# Patient Record
Sex: Female | Born: 1935
Health system: Southern US, Community
[De-identification: ages and names within clinical notes are randomized; demographics above are authoritative.]

## PROBLEM LIST (undated history)

## (undated) DIAGNOSIS — A770 Spotted fever due to Rickettsia rickettsii: Secondary | ICD-10-CM

## (undated) DIAGNOSIS — D329 Benign neoplasm of meninges, unspecified: Secondary | ICD-10-CM

## (undated) DIAGNOSIS — M545 Low back pain, unspecified: Secondary | ICD-10-CM

## (undated) DIAGNOSIS — K222 Esophageal obstruction: Secondary | ICD-10-CM

## (undated) DIAGNOSIS — F419 Anxiety disorder, unspecified: Secondary | ICD-10-CM

## (undated) DIAGNOSIS — E756 Lipid storage disorder, unspecified: Secondary | ICD-10-CM

## (undated) DIAGNOSIS — K219 Gastro-esophageal reflux disease without esophagitis: Secondary | ICD-10-CM

## (undated) DIAGNOSIS — G459 Transient cerebral ischemic attack, unspecified: Secondary | ICD-10-CM

## (undated) DIAGNOSIS — Z853 Personal history of malignant neoplasm of breast: Secondary | ICD-10-CM

## (undated) DIAGNOSIS — M1990 Unspecified osteoarthritis, unspecified site: Secondary | ICD-10-CM

## (undated) DIAGNOSIS — N281 Cyst of kidney, acquired: Secondary | ICD-10-CM

## (undated) DIAGNOSIS — I639 Cerebral infarction, unspecified: Secondary | ICD-10-CM

## (undated) DIAGNOSIS — D649 Anemia, unspecified: Secondary | ICD-10-CM

## (undated) DIAGNOSIS — I499 Cardiac arrhythmia, unspecified: Secondary | ICD-10-CM

## (undated) DIAGNOSIS — K648 Other hemorrhoids: Secondary | ICD-10-CM

## (undated) DIAGNOSIS — K224 Dyskinesia of esophagus: Secondary | ICD-10-CM

## (undated) DIAGNOSIS — K649 Unspecified hemorrhoids: Secondary | ICD-10-CM

## (undated) DIAGNOSIS — E785 Hyperlipidemia, unspecified: Secondary | ICD-10-CM

## (undated) DIAGNOSIS — K5790 Diverticulosis of intestine, part unspecified, without perforation or abscess without bleeding: Secondary | ICD-10-CM

## (undated) DIAGNOSIS — Z8719 Personal history of other diseases of the digestive system: Secondary | ICD-10-CM

## (undated) DIAGNOSIS — T7840XA Allergy, unspecified, initial encounter: Secondary | ICD-10-CM

## (undated) DIAGNOSIS — I2510 Atherosclerotic heart disease of native coronary artery without angina pectoris: Secondary | ICD-10-CM

## (undated) DIAGNOSIS — K635 Polyp of colon: Secondary | ICD-10-CM

## (undated) DIAGNOSIS — I10 Essential (primary) hypertension: Secondary | ICD-10-CM

## (undated) DIAGNOSIS — G709 Myoneural disorder, unspecified: Secondary | ICD-10-CM

## (undated) DIAGNOSIS — K5730 Diverticulosis of large intestine without perforation or abscess without bleeding: Secondary | ICD-10-CM

## (undated) DIAGNOSIS — E8809 Other disorders of plasma-protein metabolism, not elsewhere classified: Secondary | ICD-10-CM

## (undated) DIAGNOSIS — I4891 Unspecified atrial fibrillation: Secondary | ICD-10-CM

## (undated) DIAGNOSIS — M1712 Unilateral primary osteoarthritis, left knee: Secondary | ICD-10-CM

## (undated) DIAGNOSIS — G250 Essential tremor: Secondary | ICD-10-CM

## (undated) DIAGNOSIS — K449 Diaphragmatic hernia without obstruction or gangrene: Secondary | ICD-10-CM

## (undated) DIAGNOSIS — C50919 Malignant neoplasm of unspecified site of unspecified female breast: Secondary | ICD-10-CM

## (undated) DIAGNOSIS — M419 Scoliosis, unspecified: Secondary | ICD-10-CM

## (undated) DIAGNOSIS — I071 Rheumatic tricuspid insufficiency: Secondary | ICD-10-CM

## (undated) DIAGNOSIS — E538 Deficiency of other specified B group vitamins: Secondary | ICD-10-CM

## (undated) HISTORY — DX: Other hemorrhoids: K64.8

## (undated) HISTORY — DX: Anemia, unspecified: D64.9

## (undated) HISTORY — DX: Malignant neoplasm of unspecified site of unspecified female breast: C50.919

## (undated) HISTORY — DX: Other disorders of plasma-protein metabolism, not elsewhere classified: E88.09

## (undated) HISTORY — PX: AUGMENTATION MAMMAPLASTY: SUR837

## (undated) HISTORY — DX: Essential tremor: G25.0

## (undated) HISTORY — DX: Scoliosis, unspecified: M41.9

## (undated) HISTORY — PX: OTHER SURGICAL HISTORY: SHX169

## (undated) HISTORY — PX: UPPER GASTROINTESTINAL ENDOSCOPY: SHX188

## (undated) HISTORY — DX: Hyperlipidemia, unspecified: E78.5

## (undated) HISTORY — DX: Low back pain, unspecified: M54.50

## (undated) HISTORY — PX: OOPHORECTOMY: SHX86

## (undated) HISTORY — DX: Diverticulosis of large intestine without perforation or abscess without bleeding: K57.30

## (undated) HISTORY — DX: Personal history of malignant neoplasm of breast: Z85.3

## (undated) HISTORY — DX: Gastro-esophageal reflux disease without esophagitis: K21.9

## (undated) HISTORY — PX: VAGINAL HYSTERECTOMY: SUR661

## (undated) HISTORY — DX: Dyskinesia of esophagus: K22.4

## (undated) HISTORY — PX: CATARACT EXTRACTION, BILATERAL: SHX1313

## (undated) HISTORY — DX: Polyp of colon: K63.5

## (undated) HISTORY — DX: Personal history of other diseases of the digestive system: Z87.19

## (undated) HISTORY — DX: Deficiency of other specified B group vitamins: E53.8

## (undated) HISTORY — DX: Unspecified hemorrhoids: K64.9

## (undated) HISTORY — DX: Diaphragmatic hernia without obstruction or gangrene: K44.9

## (undated) HISTORY — DX: Low back pain: M54.5

## (undated) HISTORY — DX: Unspecified osteoarthritis, unspecified site: M19.90

## (undated) HISTORY — PX: COLONOSCOPY: SHX174

## (undated) HISTORY — DX: Cyst of kidney, acquired: N28.1

## (undated) HISTORY — DX: Benign neoplasm of meninges, unspecified: D32.9

## (undated) HISTORY — DX: Lipid storage disorder, unspecified: E75.6

## (undated) HISTORY — DX: Allergy, unspecified, initial encounter: T78.40XA

## (undated) HISTORY — DX: Spotted fever due to Rickettsia rickettsii: A77.0

## (undated) HISTORY — PX: POLYPECTOMY: SHX149

## (undated) HISTORY — DX: Diverticulosis of intestine, part unspecified, without perforation or abscess without bleeding: K57.90

## (undated) HISTORY — DX: Esophageal obstruction: K22.2

## (undated) HISTORY — DX: Rheumatic tricuspid insufficiency: I07.1

## (undated) HISTORY — PX: BLEPHAROPLASTY: SUR158

## (undated) HISTORY — DX: Unspecified atrial fibrillation: I48.91

## (undated) HISTORY — PX: JOINT REPLACEMENT: SHX530

## (undated) HISTORY — DX: Transient cerebral ischemic attack, unspecified: G45.9

---

## 1982-08-17 DIAGNOSIS — C50919 Malignant neoplasm of unspecified site of unspecified female breast: Secondary | ICD-10-CM

## 1982-08-17 HISTORY — PX: MASTECTOMY: SHX3

## 1982-08-17 HISTORY — DX: Malignant neoplasm of unspecified site of unspecified female breast: C50.919

## 1998-05-01 ENCOUNTER — Other Ambulatory Visit: Admission: RE | Admit: 1998-05-01 | Discharge: 1998-05-01 | Payer: Self-pay | Admitting: Obstetrics and Gynecology

## 1999-05-14 ENCOUNTER — Other Ambulatory Visit: Admission: RE | Admit: 1999-05-14 | Discharge: 1999-05-14 | Payer: Self-pay | Admitting: Obstetrics and Gynecology

## 1999-12-04 ENCOUNTER — Ambulatory Visit (HOSPITAL_BASED_OUTPATIENT_CLINIC_OR_DEPARTMENT_OTHER): Admission: RE | Admit: 1999-12-04 | Discharge: 1999-12-04 | Payer: Self-pay | Admitting: *Deleted

## 2000-04-30 ENCOUNTER — Other Ambulatory Visit: Admission: RE | Admit: 2000-04-30 | Discharge: 2000-04-30 | Payer: Self-pay | Admitting: Obstetrics and Gynecology

## 2001-05-06 ENCOUNTER — Other Ambulatory Visit: Admission: RE | Admit: 2001-05-06 | Discharge: 2001-05-06 | Payer: Self-pay | Admitting: Obstetrics and Gynecology

## 2002-05-31 ENCOUNTER — Other Ambulatory Visit: Admission: RE | Admit: 2002-05-31 | Discharge: 2002-05-31 | Payer: Self-pay | Admitting: Obstetrics and Gynecology

## 2003-06-08 ENCOUNTER — Encounter: Payer: Self-pay | Admitting: *Deleted

## 2003-06-08 ENCOUNTER — Encounter: Payer: Self-pay | Admitting: Radiology

## 2003-06-08 ENCOUNTER — Encounter: Admission: RE | Admit: 2003-06-08 | Discharge: 2003-06-08 | Payer: Self-pay | Admitting: *Deleted

## 2003-06-29 ENCOUNTER — Encounter: Admission: RE | Admit: 2003-06-29 | Discharge: 2003-06-29 | Payer: Self-pay | Admitting: *Deleted

## 2003-10-19 ENCOUNTER — Encounter: Admission: RE | Admit: 2003-10-19 | Discharge: 2003-10-19 | Payer: Self-pay | Admitting: Internal Medicine

## 2004-07-04 ENCOUNTER — Other Ambulatory Visit: Admission: RE | Admit: 2004-07-04 | Discharge: 2004-07-04 | Payer: Self-pay | Admitting: Obstetrics and Gynecology

## 2004-08-15 ENCOUNTER — Ambulatory Visit: Payer: Self-pay | Admitting: Gastroenterology

## 2004-08-26 ENCOUNTER — Ambulatory Visit: Payer: Self-pay | Admitting: Gastroenterology

## 2004-10-17 ENCOUNTER — Ambulatory Visit: Payer: Self-pay | Admitting: Internal Medicine

## 2004-10-24 ENCOUNTER — Ambulatory Visit: Payer: Self-pay | Admitting: Internal Medicine

## 2004-12-26 ENCOUNTER — Ambulatory Visit: Payer: Self-pay | Admitting: Internal Medicine

## 2005-02-27 ENCOUNTER — Ambulatory Visit: Payer: Self-pay

## 2005-03-27 ENCOUNTER — Emergency Department (HOSPITAL_COMMUNITY): Admission: EM | Admit: 2005-03-27 | Discharge: 2005-03-27 | Payer: Self-pay | Admitting: Family Medicine

## 2005-04-08 ENCOUNTER — Ambulatory Visit: Payer: Self-pay | Admitting: Internal Medicine

## 2005-05-01 ENCOUNTER — Ambulatory Visit: Payer: Self-pay | Admitting: Internal Medicine

## 2005-10-12 ENCOUNTER — Ambulatory Visit: Payer: Self-pay | Admitting: Internal Medicine

## 2005-10-19 ENCOUNTER — Ambulatory Visit: Payer: Self-pay | Admitting: Internal Medicine

## 2006-04-13 ENCOUNTER — Ambulatory Visit: Payer: Self-pay | Admitting: Internal Medicine

## 2006-04-22 ENCOUNTER — Ambulatory Visit: Payer: Self-pay | Admitting: Internal Medicine

## 2006-06-03 ENCOUNTER — Ambulatory Visit: Payer: Self-pay | Admitting: Internal Medicine

## 2006-08-23 ENCOUNTER — Ambulatory Visit: Payer: Self-pay | Admitting: Internal Medicine

## 2006-10-12 ENCOUNTER — Ambulatory Visit: Payer: Self-pay | Admitting: Internal Medicine

## 2006-10-12 LAB — CONVERTED CEMR LAB
ALT: 26 units/L (ref 0–40)
AST: 30 units/L (ref 0–37)
Cholesterol: 155 mg/dL (ref 0–200)
HDL: 42.9 mg/dL (ref >39.0)
LDL Cholesterol: 87 mg/dL (ref 0–99)
Sed Rate: 9 mm/hr (ref 0–25)
Total CHOL/HDL Ratio: 3.6
Triglycerides: 127 mg/dL (ref 0–149)
VLDL: 25 mg/dL (ref 0–40)
Vit D, 1,25-Dihydroxy: 12 — ABNORMAL LOW (ref 20–57)
Vitamin B-12: 369 pg/mL (ref 211–911)

## 2006-10-21 ENCOUNTER — Ambulatory Visit: Payer: Self-pay | Admitting: Internal Medicine

## 2006-10-26 ENCOUNTER — Ambulatory Visit: Payer: Self-pay | Admitting: Internal Medicine

## 2007-01-27 ENCOUNTER — Ambulatory Visit: Payer: Self-pay | Admitting: Internal Medicine

## 2007-01-27 LAB — CONVERTED CEMR LAB
Glucose, Bld: 81 mg/dL (ref 70–99)
Potassium: 4.1 meq/L (ref 3.5–5.1)
Vit D, 1,25-Dihydroxy: 32 (ref 20–57)

## 2007-02-02 ENCOUNTER — Ambulatory Visit: Payer: Self-pay | Admitting: Internal Medicine

## 2007-02-04 ENCOUNTER — Ambulatory Visit: Payer: Self-pay

## 2007-03-21 ENCOUNTER — Encounter: Payer: Self-pay | Admitting: Internal Medicine

## 2007-03-21 DIAGNOSIS — Z860100 Personal history of colon polyps, unspecified: Secondary | ICD-10-CM | POA: Insufficient documentation

## 2007-03-21 DIAGNOSIS — E538 Deficiency of other specified B group vitamins: Secondary | ICD-10-CM | POA: Insufficient documentation

## 2007-03-21 DIAGNOSIS — Z853 Personal history of malignant neoplasm of breast: Secondary | ICD-10-CM

## 2007-03-21 DIAGNOSIS — E559 Vitamin D deficiency, unspecified: Secondary | ICD-10-CM

## 2007-03-21 DIAGNOSIS — I079 Rheumatic tricuspid valve disease, unspecified: Secondary | ICD-10-CM | POA: Insufficient documentation

## 2007-03-21 DIAGNOSIS — M1990 Unspecified osteoarthritis, unspecified site: Secondary | ICD-10-CM

## 2007-03-21 DIAGNOSIS — M810 Age-related osteoporosis without current pathological fracture: Secondary | ICD-10-CM

## 2007-03-21 DIAGNOSIS — K5730 Diverticulosis of large intestine without perforation or abscess without bleeding: Secondary | ICD-10-CM | POA: Insufficient documentation

## 2007-06-11 ENCOUNTER — Ambulatory Visit: Payer: Self-pay | Admitting: Internal Medicine

## 2007-07-19 ENCOUNTER — Other Ambulatory Visit: Admission: RE | Admit: 2007-07-19 | Discharge: 2007-07-19 | Payer: Self-pay | Admitting: Obstetrics and Gynecology

## 2007-07-21 ENCOUNTER — Telehealth: Payer: Self-pay | Admitting: Internal Medicine

## 2007-07-28 ENCOUNTER — Ambulatory Visit: Payer: Self-pay | Admitting: Internal Medicine

## 2007-07-28 LAB — CONVERTED CEMR LAB
ALT: 24 units/L (ref 0–35)
AST: 25 units/L (ref 0–37)
Albumin: 3.4 g/dL — ABNORMAL LOW (ref 3.5–5.2)
Alkaline Phosphatase: 52 units/L (ref 39–117)
BUN: 11 mg/dL (ref 6–23)
Basophils Absolute: 0 10*3/uL (ref 0.0–0.1)
Basophils Relative: 0.3 % (ref 0.0–1.0)
Bilirubin Urine: NEGATIVE
Bilirubin, Direct: 0.2 mg/dL (ref 0.0–0.3)
CO2: 29 meq/L (ref 19–32)
Calcium: 8.8 mg/dL (ref 8.4–10.5)
Chloride: 106 meq/L (ref 96–112)
Cholesterol: 146 mg/dL (ref 0–200)
Creatinine, Ser: 0.7 mg/dL (ref 0.4–1.2)
Eosinophils Absolute: 0.2 10*3/uL (ref 0.0–0.6)
Eosinophils Relative: 3.3 % (ref 0.0–5.0)
GFR calc Af Amer: 106 mL/min
GFR calc non Af Amer: 88 mL/min
Glucose, Bld: 91 mg/dL (ref 70–99)
HCT: 39.9 % (ref 36.0–46.0)
HDL: 37.9 mg/dL — ABNORMAL LOW (ref >39.0)
Hemoglobin: 13.4 g/dL (ref 12.0–15.0)
Ketones, ur: NEGATIVE mg/dL
LDL Cholesterol: 96 mg/dL (ref 0–99)
Leukocytes, UA: NEGATIVE
Lymphocytes Relative: 47.6 % — ABNORMAL HIGH (ref 12.0–46.0)
MCHC: 33.5 g/dL (ref 30.0–36.0)
MCV: 91.5 fL (ref 78.0–100.0)
Monocytes Absolute: 0.4 10*3/uL (ref 0.2–0.7)
Monocytes Relative: 7.6 % (ref 3.0–11.0)
Neutro Abs: 2 10*3/uL (ref 1.4–7.7)
Neutrophils Relative %: 41.2 % — ABNORMAL LOW (ref 43.0–77.0)
Nitrite: NEGATIVE
Platelets: 255 10*3/uL (ref 150–400)
Potassium: 4.4 meq/L (ref 3.5–5.1)
RBC: 4.36 M/uL (ref 3.87–5.11)
RDW: 12.3 % (ref 11.5–14.6)
Sodium: 141 meq/L (ref 135–145)
Specific Gravity, Urine: 1.015 (ref 1.000–1.03)
TSH: 2.26 microintl units/mL (ref 0.35–5.50)
Total Bilirubin: 0.8 mg/dL (ref 0.3–1.2)
Total CHOL/HDL Ratio: 3.9
Total Protein, Urine: NEGATIVE mg/dL
Total Protein: 6.3 g/dL (ref 6.0–8.3)
Triglycerides: 59 mg/dL (ref 0–149)
Urine Glucose: NEGATIVE mg/dL
Urobilinogen, UA: 0.2 (ref 0.0–1.0)
VLDL: 12 mg/dL (ref 0–40)
Vit D, 1,25-Dihydroxy: 41 (ref 30–89)
Vitamin B-12: 1411 pg/mL — ABNORMAL HIGH (ref 211–911)
WBC: 4.8 10*3/uL (ref 4.5–10.5)
pH: 7 (ref 5.0–8.0)

## 2007-08-04 ENCOUNTER — Ambulatory Visit: Payer: Self-pay | Admitting: Internal Medicine

## 2007-08-04 DIAGNOSIS — Z87448 Personal history of other diseases of urinary system: Secondary | ICD-10-CM

## 2007-11-10 ENCOUNTER — Ambulatory Visit: Payer: Self-pay | Admitting: Internal Medicine

## 2007-11-16 LAB — CONVERTED CEMR LAB
Bilirubin Urine: NEGATIVE
Ketones, ur: NEGATIVE mg/dL
Leukocytes, UA: NEGATIVE
Nitrite: NEGATIVE
Specific Gravity, Urine: 1.01 (ref 1.000–1.03)
Total Protein, Urine: NEGATIVE mg/dL
Urine Glucose: NEGATIVE mg/dL
Urobilinogen, UA: 0.2 (ref 0.0–1.0)
pH: 8 (ref 5.0–8.0)

## 2008-01-09 ENCOUNTER — Emergency Department (HOSPITAL_COMMUNITY): Admission: EM | Admit: 2008-01-09 | Discharge: 2008-01-09 | Payer: Self-pay | Admitting: Family Medicine

## 2008-01-27 ENCOUNTER — Ambulatory Visit: Payer: Self-pay | Admitting: Internal Medicine

## 2008-01-27 LAB — CONVERTED CEMR LAB
BUN: 16 mg/dL (ref 6–23)
CO2: 29 meq/L (ref 19–32)
Calcium: 9.1 mg/dL (ref 8.4–10.5)
Chloride: 106 meq/L (ref 96–112)
Cholesterol: 165 mg/dL (ref 0–200)
Creatinine, Ser: 0.7 mg/dL (ref 0.4–1.2)
GFR calc Af Amer: 106 mL/min
GFR calc non Af Amer: 87 mL/min
Glucose, Bld: 90 mg/dL (ref 70–99)
HDL: 29.3 mg/dL — ABNORMAL LOW (ref >39.0)
LDL Cholesterol: 102 mg/dL — ABNORMAL HIGH (ref 0–99)
Potassium: 4.6 meq/L (ref 3.5–5.1)
Sodium: 141 meq/L (ref 135–145)
TSH: 2.41 microintl units/mL (ref 0.35–5.50)
Total CHOL/HDL Ratio: 5.6
Triglycerides: 167 mg/dL — ABNORMAL HIGH (ref 0–149)
VLDL: 33 mg/dL (ref 0–40)

## 2008-02-07 ENCOUNTER — Ambulatory Visit: Payer: Self-pay | Admitting: Internal Medicine

## 2008-02-07 DIAGNOSIS — R05 Cough: Secondary | ICD-10-CM

## 2008-02-07 DIAGNOSIS — M79609 Pain in unspecified limb: Secondary | ICD-10-CM

## 2008-05-15 ENCOUNTER — Ambulatory Visit: Payer: Self-pay | Admitting: Internal Medicine

## 2008-05-15 DIAGNOSIS — R930 Abnormal findings on diagnostic imaging of skull and head, not elsewhere classified: Secondary | ICD-10-CM

## 2008-05-15 DIAGNOSIS — R109 Unspecified abdominal pain: Secondary | ICD-10-CM | POA: Insufficient documentation

## 2008-05-17 ENCOUNTER — Ambulatory Visit: Payer: Self-pay | Admitting: Internal Medicine

## 2008-05-21 LAB — CONVERTED CEMR LAB
ALT: 15 units/L (ref 0–35)
AST: 21 units/L (ref 0–37)
Albumin: 4 g/dL (ref 3.5–5.2)
Alkaline Phosphatase: 65 units/L (ref 39–117)
Amylase: 75 units/L (ref 27–131)
BUN: 12 mg/dL (ref 6–23)
Bacteria, UA: NEGATIVE
Basophils Absolute: 0 10*3/uL (ref 0.0–0.1)
Basophils Relative: 0.5 % (ref 0.0–3.0)
Bilirubin Urine: NEGATIVE
Bilirubin, Direct: 0.2 mg/dL (ref 0.0–0.3)
CO2: 29 meq/L (ref 19–32)
Calcium: 9 mg/dL (ref 8.4–10.5)
Chloride: 104 meq/L (ref 96–112)
Creatinine, Ser: 0.8 mg/dL (ref 0.4–1.2)
Crystals: NEGATIVE
Eosinophils Absolute: 1.5 10*3/uL — ABNORMAL HIGH (ref 0.0–0.7)
Eosinophils Relative: 23.3 % — ABNORMAL HIGH (ref 0.0–5.0)
GFR calc Af Amer: 91 mL/min
GFR calc non Af Amer: 75 mL/min
Glucose, Bld: 85 mg/dL (ref 70–99)
HCT: 42 % (ref 36.0–46.0)
Hemoglobin: 14.5 g/dL (ref 12.0–15.0)
Ketones, ur: NEGATIVE mg/dL
Leukocytes, UA: NEGATIVE
Lymphocytes Relative: 39.9 % (ref 12.0–46.0)
MCHC: 34.4 g/dL (ref 30.0–36.0)
MCV: 92.5 fL (ref 78.0–100.0)
Monocytes Absolute: 0.5 10*3/uL (ref 0.1–1.0)
Monocytes Relative: 7.3 % (ref 3.0–12.0)
Neutro Abs: 1.9 10*3/uL (ref 1.4–7.7)
Neutrophils Relative %: 29 % — ABNORMAL LOW (ref 43.0–77.0)
Nitrite: NEGATIVE
Platelets: 261 10*3/uL (ref 150–400)
Potassium: 4.1 meq/L (ref 3.5–5.1)
RBC: 4.54 M/uL (ref 3.87–5.11)
RDW: 12 % (ref 11.5–14.6)
Sed Rate: 6 mm/hr (ref 0–22)
Sodium: 140 meq/L (ref 135–145)
Specific Gravity, Urine: 1.025 (ref 1.000–1.03)
TSH: 2.81 microintl units/mL (ref 0.35–5.50)
Total Bilirubin: 0.8 mg/dL (ref 0.3–1.2)
Total Protein, Urine: NEGATIVE mg/dL
Total Protein: 6.8 g/dL (ref 6.0–8.3)
Urine Glucose: NEGATIVE mg/dL
Urobilinogen, UA: 1 (ref 0.0–1.0)
Vitamin B-12: 651 pg/mL (ref 211–911)
WBC, UA: NONE SEEN cells/hpf
WBC: 6.5 10*3/uL (ref 4.5–10.5)
pH: 6 (ref 5.0–8.0)

## 2008-05-31 ENCOUNTER — Ambulatory Visit: Payer: Self-pay | Admitting: Internal Medicine

## 2008-05-31 DIAGNOSIS — R197 Diarrhea, unspecified: Secondary | ICD-10-CM

## 2008-06-05 ENCOUNTER — Ambulatory Visit: Payer: Self-pay | Admitting: Internal Medicine

## 2008-11-09 ENCOUNTER — Ambulatory Visit: Payer: Self-pay | Admitting: Internal Medicine

## 2008-11-27 ENCOUNTER — Ambulatory Visit: Payer: Self-pay | Admitting: Internal Medicine

## 2008-11-27 LAB — CONVERTED CEMR LAB
ALT: 22 units/L (ref 0–35)
AST: 25 units/L (ref 0–37)
Albumin: 3.6 g/dL (ref 3.5–5.2)
Alkaline Phosphatase: 55 units/L (ref 39–117)
BUN: 14 mg/dL (ref 6–23)
Bilirubin, Direct: 0 mg/dL (ref 0.0–0.3)
CO2: 30 meq/L (ref 19–32)
Calcium: 8.9 mg/dL (ref 8.4–10.5)
Chloride: 109 meq/L (ref 96–112)
Creatinine, Ser: 0.7 mg/dL (ref 0.4–1.2)
GFR calc non Af Amer: 87.13 mL/min (ref >60)
Glucose, Bld: 86 mg/dL (ref 70–99)
Potassium: 4.4 meq/L (ref 3.5–5.1)
Sodium: 142 meq/L (ref 135–145)
Total Bilirubin: 1 mg/dL (ref 0.3–1.2)
Total Protein: 6.5 g/dL (ref 6.0–8.3)
Vit D, 25-Hydroxy: 33 ng/mL (ref 30–89)
Vitamin B-12: 658 pg/mL (ref 211–911)

## 2008-12-03 ENCOUNTER — Ambulatory Visit: Payer: Self-pay | Admitting: Internal Medicine

## 2009-01-31 ENCOUNTER — Encounter: Payer: Self-pay | Admitting: Internal Medicine

## 2009-01-31 ENCOUNTER — Ambulatory Visit: Payer: Self-pay | Admitting: Internal Medicine

## 2009-01-31 DIAGNOSIS — M2548 Effusion, other site: Secondary | ICD-10-CM | POA: Insufficient documentation

## 2009-01-31 DIAGNOSIS — J320 Chronic maxillary sinusitis: Secondary | ICD-10-CM

## 2009-03-05 ENCOUNTER — Encounter: Payer: Self-pay | Admitting: Internal Medicine

## 2009-06-05 ENCOUNTER — Ambulatory Visit: Payer: Self-pay | Admitting: Internal Medicine

## 2009-06-05 ENCOUNTER — Encounter: Payer: Self-pay | Admitting: Internal Medicine

## 2009-06-05 DIAGNOSIS — M545 Low back pain: Secondary | ICD-10-CM

## 2009-06-06 ENCOUNTER — Telehealth: Payer: Self-pay | Admitting: Internal Medicine

## 2009-06-06 LAB — CONVERTED CEMR LAB
ALT: 30 units/L (ref 0–35)
AST: 28 units/L (ref 0–37)
Albumin: 3.9 g/dL (ref 3.5–5.2)
Alkaline Phosphatase: 59 units/L (ref 39–117)
BUN: 15 mg/dL (ref 6–23)
Bilirubin, Direct: 0.1 mg/dL (ref 0.0–0.3)
CO2: 29 meq/L (ref 19–32)
Calcium: 9.1 mg/dL (ref 8.4–10.5)
Chloride: 107 meq/L (ref 96–112)
Cholesterol: 256 mg/dL — ABNORMAL HIGH (ref 0–200)
Creatinine, Ser: 0.7 mg/dL (ref 0.4–1.2)
Direct LDL: 190 mg/dL
GFR calc non Af Amer: 87 mL/min (ref >60)
Glucose, Bld: 75 mg/dL (ref 70–99)
HDL: 46.4 mg/dL (ref >39.00)
Potassium: 3.9 meq/L (ref 3.5–5.1)
Sodium: 143 meq/L (ref 135–145)
TSH: 2.24 microintl units/mL (ref 0.35–5.50)
Total Bilirubin: 0.9 mg/dL (ref 0.3–1.2)
Total CHOL/HDL Ratio: 6
Total Protein: 6.8 g/dL (ref 6.0–8.3)
Triglycerides: 88 mg/dL (ref 0.0–149.0)
VLDL: 17.6 mg/dL (ref 0.0–40.0)
Vit D, 25-Hydroxy: 24 ng/mL — ABNORMAL LOW (ref 30–89)
Vitamin B-12: 409 pg/mL (ref 211–911)

## 2009-06-11 ENCOUNTER — Telehealth (INDEPENDENT_AMBULATORY_CARE_PROVIDER_SITE_OTHER): Payer: Self-pay

## 2009-06-12 ENCOUNTER — Encounter (HOSPITAL_COMMUNITY): Admission: RE | Admit: 2009-06-12 | Discharge: 2009-08-16 | Payer: Self-pay | Admitting: Internal Medicine

## 2009-06-12 ENCOUNTER — Ambulatory Visit: Payer: Self-pay | Admitting: Cardiology

## 2009-06-12 ENCOUNTER — Ambulatory Visit: Payer: Self-pay

## 2009-07-23 ENCOUNTER — Other Ambulatory Visit: Admission: RE | Admit: 2009-07-23 | Discharge: 2009-07-23 | Payer: Self-pay | Admitting: Obstetrics and Gynecology

## 2009-07-23 ENCOUNTER — Ambulatory Visit: Payer: Self-pay | Admitting: Obstetrics and Gynecology

## 2009-11-19 ENCOUNTER — Ambulatory Visit: Payer: Self-pay | Admitting: Internal Medicine

## 2009-11-19 LAB — CONVERTED CEMR LAB
ALT: 28 units/L (ref 0–35)
AST: 33 units/L (ref 0–37)
Albumin: 3.8 g/dL (ref 3.5–5.2)
Alkaline Phosphatase: 59 units/L (ref 39–117)
BUN: 14 mg/dL (ref 6–23)
Bilirubin, Direct: 0.1 mg/dL (ref 0.0–0.3)
CO2: 28 meq/L (ref 19–32)
Calcium: 8.7 mg/dL (ref 8.4–10.5)
Chloride: 107 meq/L (ref 96–112)
Cholesterol: 146 mg/dL (ref 0–200)
Creatinine, Ser: 0.8 mg/dL (ref 0.4–1.2)
GFR calc non Af Amer: 74.48 mL/min (ref >60)
Glucose, Bld: 79 mg/dL (ref 70–99)
HDL: 39.7 mg/dL (ref >39.00)
LDL Cholesterol: 88 mg/dL (ref 0–99)
Potassium: 4.1 meq/L (ref 3.5–5.1)
Sodium: 141 meq/L (ref 135–145)
Total Bilirubin: 0.7 mg/dL (ref 0.3–1.2)
Total CHOL/HDL Ratio: 4
Total Protein: 6.7 g/dL (ref 6.0–8.3)
Triglycerides: 91 mg/dL (ref 0.0–149.0)
VLDL: 18.2 mg/dL (ref 0.0–40.0)

## 2009-11-28 ENCOUNTER — Ambulatory Visit: Payer: Self-pay | Admitting: Internal Medicine

## 2009-11-28 DIAGNOSIS — R252 Cramp and spasm: Secondary | ICD-10-CM | POA: Insufficient documentation

## 2010-02-03 ENCOUNTER — Ambulatory Visit: Payer: Self-pay | Admitting: Internal Medicine

## 2010-02-03 LAB — CONVERTED CEMR LAB
BUN: 14 mg/dL (ref 6–23)
CO2: 29 meq/L (ref 19–32)
Calcium: 8.9 mg/dL (ref 8.4–10.5)
Chloride: 110 meq/L (ref 96–112)
Cholesterol: 164 mg/dL (ref 0–200)
Creatinine, Ser: 0.7 mg/dL (ref 0.4–1.2)
GFR calc non Af Amer: 94.6 mL/min (ref >60)
Glucose, Bld: 86 mg/dL (ref 70–99)
HDL: 37.8 mg/dL — ABNORMAL LOW (ref >39.00)
LDL Cholesterol: 105 mg/dL — ABNORMAL HIGH (ref 0–99)
Potassium: 4.5 meq/L (ref 3.5–5.1)
Sodium: 142 meq/L (ref 135–145)
Total CHOL/HDL Ratio: 4
Total CK: 113 units/L (ref 7–177)
Triglycerides: 108 mg/dL (ref 0.0–149.0)
VLDL: 21.6 mg/dL (ref 0.0–40.0)
Vitamin B-12: 750 pg/mL (ref 211–911)

## 2010-02-07 ENCOUNTER — Ambulatory Visit: Payer: Self-pay | Admitting: Internal Medicine

## 2010-02-07 DIAGNOSIS — M25569 Pain in unspecified knee: Secondary | ICD-10-CM | POA: Insufficient documentation

## 2010-03-25 ENCOUNTER — Inpatient Hospital Stay (HOSPITAL_COMMUNITY): Admission: EM | Admit: 2010-03-25 | Discharge: 2010-03-26 | Payer: Self-pay | Admitting: Cardiology

## 2010-03-25 ENCOUNTER — Ambulatory Visit: Payer: Self-pay | Admitting: Internal Medicine

## 2010-03-26 ENCOUNTER — Encounter: Payer: Self-pay | Admitting: Internal Medicine

## 2010-03-31 ENCOUNTER — Ambulatory Visit: Payer: Self-pay | Admitting: Internal Medicine

## 2010-03-31 ENCOUNTER — Telehealth: Payer: Self-pay | Admitting: Internal Medicine

## 2010-03-31 DIAGNOSIS — R55 Syncope and collapse: Secondary | ICD-10-CM | POA: Insufficient documentation

## 2010-04-01 ENCOUNTER — Ambulatory Visit: Payer: Self-pay

## 2010-04-01 ENCOUNTER — Encounter: Payer: Self-pay | Admitting: Internal Medicine

## 2010-04-01 ENCOUNTER — Ambulatory Visit: Payer: Self-pay | Admitting: Internal Medicine

## 2010-04-01 ENCOUNTER — Ambulatory Visit (HOSPITAL_COMMUNITY): Admission: RE | Admit: 2010-04-01 | Discharge: 2010-04-01 | Payer: Self-pay | Admitting: Internal Medicine

## 2010-04-03 ENCOUNTER — Telehealth: Payer: Self-pay | Admitting: Internal Medicine

## 2010-04-08 ENCOUNTER — Ambulatory Visit: Payer: Self-pay | Admitting: Internal Medicine

## 2010-04-08 DIAGNOSIS — I4891 Unspecified atrial fibrillation: Secondary | ICD-10-CM

## 2010-04-10 ENCOUNTER — Telehealth: Payer: Self-pay | Admitting: Internal Medicine

## 2010-05-02 ENCOUNTER — Ambulatory Visit: Payer: Self-pay | Admitting: Internal Medicine

## 2010-05-02 DIAGNOSIS — R609 Edema, unspecified: Secondary | ICD-10-CM | POA: Insufficient documentation

## 2010-05-06 ENCOUNTER — Encounter: Payer: Self-pay | Admitting: Internal Medicine

## 2010-05-27 ENCOUNTER — Ambulatory Visit: Payer: Self-pay | Admitting: Internal Medicine

## 2010-05-27 ENCOUNTER — Telehealth: Payer: Self-pay | Admitting: Internal Medicine

## 2010-05-28 ENCOUNTER — Encounter: Payer: Self-pay | Admitting: Internal Medicine

## 2010-05-28 ENCOUNTER — Ambulatory Visit: Payer: Self-pay

## 2010-06-12 ENCOUNTER — Encounter: Payer: Self-pay | Admitting: Internal Medicine

## 2010-06-18 ENCOUNTER — Telehealth (INDEPENDENT_AMBULATORY_CARE_PROVIDER_SITE_OTHER): Payer: Self-pay | Admitting: *Deleted

## 2010-07-17 HISTORY — PX: TOTAL KNEE ARTHROPLASTY: SHX125

## 2010-07-21 ENCOUNTER — Ambulatory Visit: Payer: Self-pay | Admitting: Internal Medicine

## 2010-07-21 LAB — CONVERTED CEMR LAB
ALT: 24 units/L (ref 0–35)
AST: 29 units/L (ref 0–37)
Albumin: 3.8 g/dL (ref 3.5–5.2)
Alkaline Phosphatase: 53 units/L (ref 39–117)
BUN: 17 mg/dL (ref 6–23)
Basophils Absolute: 0 10*3/uL (ref 0.0–0.1)
Basophils Relative: 1.1 % (ref 0.0–3.0)
Bilirubin Urine: NEGATIVE
Bilirubin, Direct: 0.1 mg/dL (ref 0.0–0.3)
CO2: 29 meq/L (ref 19–32)
Calcium: 9 mg/dL (ref 8.4–10.5)
Chloride: 104 meq/L (ref 96–112)
Cholesterol: 145 mg/dL (ref 0–200)
Creatinine, Ser: 0.7 mg/dL (ref 0.4–1.2)
Eosinophils Absolute: 0.4 10*3/uL (ref 0.0–0.7)
Eosinophils Relative: 8.7 % — ABNORMAL HIGH (ref 0.0–5.0)
GFR calc non Af Amer: 92.83 mL/min (ref >60)
Glucose, Bld: 83 mg/dL (ref 70–99)
HCT: 39.3 % (ref 36.0–46.0)
HDL: 37.6 mg/dL — ABNORMAL LOW (ref >39.00)
Hemoglobin, Urine: NEGATIVE
Hemoglobin: 13.5 g/dL (ref 12.0–15.0)
Ketones, ur: NEGATIVE mg/dL
LDL Cholesterol: 81 mg/dL (ref 0–99)
Leukocytes, UA: NEGATIVE
Lymphocytes Relative: 48.4 % — ABNORMAL HIGH (ref 12.0–46.0)
Lymphs Abs: 2 10*3/uL (ref 0.7–4.0)
MCHC: 34.3 g/dL (ref 30.0–36.0)
MCV: 89.2 fL (ref 78.0–100.0)
Monocytes Absolute: 0.3 10*3/uL (ref 0.1–1.0)
Monocytes Relative: 8.2 % (ref 3.0–12.0)
Neutro Abs: 1.4 10*3/uL (ref 1.4–7.7)
Neutrophils Relative %: 33.6 % — ABNORMAL LOW (ref 43.0–77.0)
Nitrite: NEGATIVE
Platelets: 218 10*3/uL (ref 150.0–400.0)
Potassium: 4.5 meq/L (ref 3.5–5.1)
RBC: 4.41 M/uL (ref 3.87–5.11)
RDW: 13.3 % (ref 11.5–14.6)
Sodium: 139 meq/L (ref 135–145)
Specific Gravity, Urine: 1.015 (ref 1.000–1.030)
TSH: 2.91 microintl units/mL (ref 0.35–5.50)
Total Bilirubin: 0.9 mg/dL (ref 0.3–1.2)
Total CHOL/HDL Ratio: 4
Total Protein, Urine: NEGATIVE mg/dL
Total Protein: 6.1 g/dL (ref 6.0–8.3)
Triglycerides: 130 mg/dL (ref 0.0–149.0)
Urine Glucose: NEGATIVE mg/dL
Urobilinogen, UA: 0.2 (ref 0.0–1.0)
VLDL: 26 mg/dL (ref 0.0–40.0)
WBC: 4.2 10*3/uL — ABNORMAL LOW (ref 4.5–10.5)
pH: 7 (ref 5.0–8.0)

## 2010-07-24 ENCOUNTER — Ambulatory Visit: Payer: Self-pay | Admitting: Obstetrics and Gynecology

## 2010-07-28 ENCOUNTER — Ambulatory Visit: Payer: Self-pay | Admitting: Internal Medicine

## 2010-07-28 DIAGNOSIS — R0989 Other specified symptoms and signs involving the circulatory and respiratory systems: Secondary | ICD-10-CM

## 2010-07-29 ENCOUNTER — Telehealth: Payer: Self-pay | Admitting: Internal Medicine

## 2010-07-29 ENCOUNTER — Telehealth (INDEPENDENT_AMBULATORY_CARE_PROVIDER_SITE_OTHER): Payer: Self-pay | Admitting: *Deleted

## 2010-07-31 ENCOUNTER — Telehealth: Payer: Self-pay | Admitting: Internal Medicine

## 2010-07-31 ENCOUNTER — Encounter: Payer: Self-pay | Admitting: Internal Medicine

## 2010-08-01 ENCOUNTER — Ambulatory Visit: Payer: Self-pay

## 2010-08-01 ENCOUNTER — Encounter: Payer: Self-pay | Admitting: Internal Medicine

## 2010-08-04 ENCOUNTER — Inpatient Hospital Stay (HOSPITAL_COMMUNITY)
Admission: RE | Admit: 2010-08-04 | Discharge: 2010-08-06 | Payer: Self-pay | Source: Home / Self Care | Attending: Orthopedic Surgery | Admitting: Orthopedic Surgery

## 2010-08-07 ENCOUNTER — Telehealth: Payer: Self-pay | Admitting: Internal Medicine

## 2010-08-08 ENCOUNTER — Ambulatory Visit: Payer: Self-pay

## 2010-08-12 ENCOUNTER — Encounter (INDEPENDENT_AMBULATORY_CARE_PROVIDER_SITE_OTHER): Payer: Self-pay | Admitting: Orthopedic Surgery

## 2010-08-12 ENCOUNTER — Ambulatory Visit (HOSPITAL_COMMUNITY)
Admission: RE | Admit: 2010-08-12 | Discharge: 2010-08-12 | Payer: Self-pay | Source: Home / Self Care | Attending: Orthopedic Surgery | Admitting: Orthopedic Surgery

## 2010-08-21 ENCOUNTER — Encounter: Payer: Self-pay | Admitting: Internal Medicine

## 2010-08-26 ENCOUNTER — Ambulatory Visit
Admission: RE | Admit: 2010-08-26 | Discharge: 2010-08-26 | Payer: Self-pay | Source: Home / Self Care | Attending: Internal Medicine | Admitting: Internal Medicine

## 2010-08-26 ENCOUNTER — Encounter: Payer: Self-pay | Admitting: Internal Medicine

## 2010-08-26 ENCOUNTER — Other Ambulatory Visit: Payer: Self-pay | Admitting: Internal Medicine

## 2010-08-26 DIAGNOSIS — R5381 Other malaise: Secondary | ICD-10-CM | POA: Insufficient documentation

## 2010-08-26 DIAGNOSIS — R5383 Other fatigue: Secondary | ICD-10-CM

## 2010-08-26 DIAGNOSIS — R110 Nausea: Secondary | ICD-10-CM | POA: Insufficient documentation

## 2010-08-26 LAB — CBC WITH DIFFERENTIAL/PLATELET
Basophils Absolute: 0.1 10*3/uL (ref 0.0–0.1)
Basophils Relative: 1.4 % (ref 0.0–3.0)
Eosinophils Absolute: 0.2 10*3/uL (ref 0.0–0.7)
Eosinophils Relative: 5.3 % — ABNORMAL HIGH (ref 0.0–5.0)
HCT: 32.4 % — ABNORMAL LOW (ref 36.0–46.0)
Hemoglobin: 11 g/dL — ABNORMAL LOW (ref 12.0–15.0)
Lymphocytes Relative: 43.7 % (ref 12.0–46.0)
Lymphs Abs: 1.9 10*3/uL (ref 0.7–4.0)
MCHC: 34 g/dL (ref 30.0–36.0)
MCV: 90.8 fl (ref 78.0–100.0)
Monocytes Absolute: 0.5 10*3/uL (ref 0.1–1.0)
Monocytes Relative: 10.1 % (ref 3.0–12.0)
Neutro Abs: 1.8 10*3/uL (ref 1.4–7.7)
Neutrophils Relative %: 39.5 % — ABNORMAL LOW (ref 43.0–77.0)
Platelets: 335 10*3/uL (ref 150.0–400.0)
RBC: 3.56 Mil/uL — ABNORMAL LOW (ref 3.87–5.11)
RDW: 14.7 % — ABNORMAL HIGH (ref 11.5–14.6)
WBC: 4.5 10*3/uL (ref 4.5–10.5)

## 2010-08-26 LAB — URINALYSIS, ROUTINE W REFLEX MICROSCOPIC
Bilirubin Urine: NEGATIVE
Ketones, ur: NEGATIVE
Leukocytes, UA: NEGATIVE
Nitrite: NEGATIVE
Specific Gravity, Urine: 1.025 (ref 1.000–1.030)
Total Protein, Urine: NEGATIVE
Urine Glucose: NEGATIVE
Urobilinogen, UA: 1 (ref 0.0–1.0)
pH: 6 (ref 5.0–8.0)

## 2010-08-26 LAB — HEPATIC FUNCTION PANEL
ALT: 16 U/L (ref 0–35)
AST: 25 U/L (ref 0–37)
Albumin: 3.8 g/dL (ref 3.5–5.2)
Alkaline Phosphatase: 71 U/L (ref 39–117)
Bilirubin, Direct: 0.1 mg/dL (ref 0.0–0.3)
Total Bilirubin: 0.6 mg/dL (ref 0.3–1.2)
Total Protein: 6.6 g/dL (ref 6.0–8.3)

## 2010-08-26 LAB — BASIC METABOLIC PANEL
BUN: 12 mg/dL (ref 6–23)
CO2: 28 mEq/L (ref 19–32)
Calcium: 9.3 mg/dL (ref 8.4–10.5)
Chloride: 105 mEq/L (ref 96–112)
Creatinine, Ser: 0.5 mg/dL (ref 0.4–1.2)
GFR: 119.54 mL/min (ref >60.00)
Glucose, Bld: 87 mg/dL (ref 70–99)
Potassium: 4.1 mEq/L (ref 3.5–5.1)
Sodium: 141 mEq/L (ref 135–145)

## 2010-08-26 LAB — BRAIN NATRIURETIC PEPTIDE: Pro B Natriuretic peptide (BNP): 116.1 pg/mL — ABNORMAL HIGH (ref 0.0–100.0)

## 2010-08-26 LAB — TSH: TSH: 1.33 u[IU]/mL (ref 0.35–5.50)

## 2010-08-26 LAB — SEDIMENTATION RATE: Sed Rate: 12 mm/hr (ref 0–22)

## 2010-08-28 ENCOUNTER — Ambulatory Visit: Admission: RE | Admit: 2010-08-28 | Discharge: 2010-08-28 | Payer: Self-pay | Source: Home / Self Care

## 2010-08-28 ENCOUNTER — Encounter: Payer: Self-pay | Admitting: Internal Medicine

## 2010-09-02 ENCOUNTER — Encounter: Payer: Self-pay | Admitting: Internal Medicine

## 2010-09-02 ENCOUNTER — Telehealth: Payer: Self-pay | Admitting: Internal Medicine

## 2010-09-02 ENCOUNTER — Ambulatory Visit
Admission: RE | Admit: 2010-09-02 | Discharge: 2010-09-02 | Payer: Self-pay | Source: Home / Self Care | Attending: Internal Medicine | Admitting: Internal Medicine

## 2010-09-02 ENCOUNTER — Inpatient Hospital Stay (HOSPITAL_COMMUNITY)
Admission: AD | Admit: 2010-09-02 | Discharge: 2010-09-03 | Payer: Self-pay | Attending: Internal Medicine | Admitting: Internal Medicine

## 2010-09-03 LAB — BASIC METABOLIC PANEL
BUN: 8 mg/dL (ref 6–23)
CO2: 25 mEq/L (ref 19–32)
Calcium: 9.1 mg/dL (ref 8.4–10.5)
Chloride: 107 mEq/L (ref 96–112)
Creatinine, Ser: 0.61 mg/dL (ref 0.4–1.2)
GFR calc Af Amer: >60 mL/min (ref >60)
GFR calc non Af Amer: >60 mL/min (ref >60)
Glucose, Bld: 101 mg/dL — ABNORMAL HIGH (ref 70–99)
Potassium: 3.8 mEq/L (ref 3.5–5.1)
Sodium: 140 mEq/L (ref 135–145)

## 2010-09-03 LAB — APTT: aPTT: 27 seconds (ref 24–37)

## 2010-09-03 LAB — CBC
HCT: 38 % (ref 36.0–46.0)
Hemoglobin: 12.3 g/dL (ref 12.0–15.0)
MCH: 29.1 pg (ref 26.0–34.0)
MCHC: 32.4 g/dL (ref 30.0–36.0)
MCV: 89.8 fL (ref 78.0–100.0)
Platelets: 234 10*3/uL (ref 150–400)
RBC: 4.23 MIL/uL (ref 3.87–5.11)
RDW: 13.4 % (ref 11.5–15.5)
WBC: 5.8 10*3/uL (ref 4.0–10.5)

## 2010-09-03 LAB — PROTIME-INR
INR: 1.04 (ref 0.00–1.49)
Prothrombin Time: 13.8 seconds (ref 11.6–15.2)

## 2010-09-05 ENCOUNTER — Encounter: Payer: Self-pay | Admitting: Cardiology

## 2010-09-05 ENCOUNTER — Ambulatory Visit: Admission: RE | Admit: 2010-09-05 | Discharge: 2010-09-05 | Payer: Self-pay | Source: Home / Self Care

## 2010-09-05 LAB — CONVERTED CEMR LAB: POC INR: 1.3

## 2010-09-08 ENCOUNTER — Ambulatory Visit
Admission: RE | Admit: 2010-09-08 | Discharge: 2010-09-08 | Payer: Self-pay | Source: Home / Self Care | Attending: Internal Medicine | Admitting: Internal Medicine

## 2010-09-08 LAB — HEPARIN LEVEL (UNFRACTIONATED)
Heparin Unfractionated: 0.19 IU/mL — ABNORMAL LOW (ref 0.30–0.70)
Heparin Unfractionated: 0.61 IU/mL (ref 0.30–0.70)

## 2010-09-08 LAB — CBC
HCT: 35.8 % — ABNORMAL LOW (ref 36.0–46.0)
Hemoglobin: 11.5 g/dL — ABNORMAL LOW (ref 12.0–15.0)
MCH: 28.8 pg (ref 26.0–34.0)
MCHC: 32.1 g/dL (ref 30.0–36.0)
MCV: 89.7 fL (ref 78.0–100.0)
Platelets: 227 10*3/uL (ref 150–400)
RBC: 3.99 MIL/uL (ref 3.87–5.11)
RDW: 13.5 % (ref 11.5–15.5)
WBC: 5 10*3/uL (ref 4.0–10.5)

## 2010-09-08 LAB — PROTIME-INR
INR: 1 (ref 0.00–1.49)
Prothrombin Time: 13.4 seconds (ref 11.6–15.2)

## 2010-09-11 ENCOUNTER — Ambulatory Visit: Admission: RE | Admit: 2010-09-11 | Discharge: 2010-09-11 | Payer: Self-pay | Source: Home / Self Care

## 2010-09-12 NOTE — Discharge Summary (Addendum)
Alyssa Luna, Alyssa Luna             ACCOUNT NO.:  [id number]  MEDICAL RECORD NO.:  [id number]          PATIENT TYPE:  INP  LOCATION:  2012                         FACILITY:  [facility]  PHYSICIAN:  Champ Mungo. Lovena Le, MD    DATE OF BIRTH:  09-Mar-1934  DATE OF ADMISSION:  09/02/2010 DATE OF DISCHARGE:  09/03/2010                              DISCHARGE SUMMARY   DISCHARGE DIAGNOSES: 1. Atrial fibrillation with rapid ventricular response, converted to     normal sinus rhythm prior to TEE cardioversion.     a.     Discharged with Lovenox to Coumadin crossover. 2. Gastroesophageal reflux disease. 3. Hyperlipidemia. 4. History of breast cancer. 5. Recent right knee replacement, August 04, 2010, with negative     lower venous Dopplers, August 28, 2010. 6. History of colonic polyps and diverticulosis. 7. Osteoporosis. 8. Osteoporosis. 9. Mild tricuspid regurgitation. 10.Vitamin B12 deficiency. 11.Vitamin D deficiency. 12.Familial tremor. 13.Low back pain. 14.Macular degeneration with history of cataracts. 15.Status post hysterectomy. 16.Status post breast augmentation. 17.Status post cataract extraction.  HOSPITAL COURSE:  Alyssa Luna is a 77 year old female with a prior history of breast cancer, hyperlipidemia, GERD, and atrial fibrillation back in August 2011.  At discharge, Alyssa Luna was in sinus rhythm.  Alyssa Luna presented to Dr. Tanna Furry office as an outpatient with complaints of tachy palpitations, persisting all day on August 31, 2010, were not evident the following day, and then were happening again on day of admission, September 02, 2010.  Alyssa Luna was admitted to the hospital for heparin to Coumadin crossover with a plan for TEE in the morning.  Alyssa Luna was started on diltiazem drip for continued rapid heart rate.  Per nursing note, the patient converted to sinus rhythm around 20 to 30 on September 02, 2010 and remained in sinus rhythm through the rest of the duration of her hospital stay.  Dr.  Lovena Le seen and examined her today when Alyssa Luna is still in sinus rhythm.  He would like to discharge her home with the below medication regimen.  Alyssa Luna will have Lovenox and Coumadin initiated at discharge with close follow up as an outpatient.  DISCHARGE LABORATORY DATA:  WBC 5.0, hemoglobin 11.5, hematocrit 35.8, and platelet count 227.  Sodium 140, potassium 3.8, chloride 107, CO2 of 25, glucose 101, BUN 8, and creatinine 0.61.  INR at discharge is 1.00.  DISCHARGE MEDICATIONS: 1. Lovenox 70 mg subcutaneously b.i.d.  The patient has been given a     prescription for 4 injections, one for tonight, 2 for tomorrow,     with instructions to have her INR checked on September 05, 2010, at     9:30 a.m.  In the event that Alyssa Luna does not make this appointment,     Alyssa Luna still has one other injection remaining on the prescription. 2. Flecainide 50 mg b.i.d. 3. Coumadin 3 mg daily with INR check on September 05, 2010. 4. Aspirin 81 mg daily. 5. Metoprolol tartrate 25 mg b.i.d. 6. Colace 100 mg b.i.d. 7. ICaps over-the-counter 1 capsule daily. 8. Multivitamin 1 tablet daily. 9. Percocet 10/325 mg 1-2 tablets q.4-6 h p.r.n. 10.Prilosec 20 mg daily. 11.Simvastatin 40 mg  daily. 12.Vitamin B12, 1000 mcg daily. 13.Vitamin D3, 1000 units daily.  DISPOSITION:  Alyssa Luna will be discharged in stable condition to home.  Alyssa Luna is instructed to increase activity slowly and may walk up steps and may shower and bathe.  Alyssa Luna is to follow a low-sodium, heart- healthy diet.  Alyssa Luna will follow up with her first INR checked in September 05, 2010, at 9:30 a.m..  Dr. Lovena Le would also like her to have 12-lead EKG at that visit which we have notified the nursing staff.  Dr. Lovena Le will follow up with the patient on September 29, 2010, at 10:45 a.m.  DURATION OF DISCHARGE ENCOUNTER:  Greater than 30 minutes including physician and PA time.     Dayna Dunn, P.A.C.   ______________________________ Champ Mungo. Lovena Le,  MD    DD/MEDQ  D:  09/03/2010  T:  09/04/2010  Job:  [id number]  cc:   Evie Lacks. Plotnikov, MD  Electronically Signed by Melina Copa  on 09/12/2010 10:32:49 AM Electronically Signed by Cristopher Peru MD on 09/25/2010 05:12:27 PM

## 2010-09-16 NOTE — Progress Notes (Signed)
Summary: WORK IN APT???  Phone Note Call from Patient Call back at Home Phone [phone]   Summary of Call: Patient has had diarrhea since being in hospital for afib & lightheadedness. She has seen some bright red blood in her stool today and does have hemorrhoids. BP today 143/59 p74. Pt has been taking pepto otc. Also c/o continued lightheadedness. She is scheduled for an ECHO tomorrow afternoon w/cardiology. Pt overall does not feel well, will you work into schedule tomorrow am or early afternoon?  Initial call taken by: Charlsie Quest, [place],  March 31, 2010 9:20 AM  Follow-up for Phone Call        w/in with any avail md pls Follow-up by: Cassandria Anger MD,  March 31, 2010 1:20 PM  Additional Follow-up for Phone Call Additional follow up Details #1::        Scheduled for [place] this pm in open apt.  Additional Follow-up by: Charlsie Quest, [place],  March 31, 2010 2:14 PM

## 2010-09-16 NOTE — Assessment & Plan Note (Signed)
Summary: plot pt/blood in stool/dizzy/SD   Vital Signs:  Patient profile:   77 year old female Height:      65 inches Weight:      155 pounds BMI:     25.89 O2 Sat:      96 % on Room air Temp:     98.0 degrees F oral Pulse rate:   74 / minute BP supine:   142 / 72  (right arm) BP sitting:   140 / 82  (right arm) BP standing:   138 / 72  (right arm) Cuff size:   regular  Vitals Entered By: Charlynne Cousins CMA (March 31, 2010 4:43 PM)  O2 Flow:  Room air CC: pt here with c/o of bright red blood in her stool x 1 day. She also has c/o of dizziness off and on x a couple of months/ ab   Primary Care Oliviah Agostini:  Cassandria Anger MD  CC:  pt here with c/o of bright red blood in her stool x 1 day. She also has c/o of dizziness off and on x a couple of months/ ab.  History of Present Illness: Last Tuesday she was driving home to [facility] and she felt light headed/dizzy on the way home. She was transported by EMS to [facility]. She was tachycardic at 168 and she was hydrated. she was given medication to stabilize her heart rate. She was transported to [facility] and she was monitored and was diagnosed  by with lone a. fib. She was sent home on metoprolol and ASA. She continued on this regimen. On Sunday she felt very feint but did not loose consciouness in church. She had been having diarrhea with 4 loose stools/ day since Tuesday. Today she had recurrent dizzyness but her vital signs were normal. She does record several episodes of marked bradycardia.  Today she also had stool with bright red blood, lower abdominal pain. By previous colonoscopy she was normal but had known hemorrhoids. She has held her medications today.   Current Medications (verified): 1)  Prilosec 40 Mg  Cpdr (Omeprazole) .... Two Times A Day 2)  Vitamin B-12 Cr 1000 Mcg  Tbcr (Cyanocobalamin) .... Take One Tablet By Mouth Daily 3)  Aspirin 325 Mg Tabs (Aspirin) .[name].. 1 Tab Daily 4)  Centrum Silver Ultra Womens  Tabs  (Multiple Vitamins-Minerals) .[name].. 1 Qd 5)  Simvastatin 40 Mg Tabs (Simvastatin) .... Take One Tablet At Bedtime 6)  Atelvia 35 Mg Tbec (Risedronate Sodium) .[name].. 1 By Mouth Once A Month 7)  Vitamin D3 1000 Unit Caps (Cholecalciferol) .[name].. 1 By Mouth Qd 8)  Omega-3 Cf 1000 Mg Caps (Omega-3 Fatty Acids) .[name].. 1 By Mouth Qod 9)  Naproxen 500 Mg Tabs (Naproxen) .[name].. 1 By Mouth Two Times A Day Pc For Pain/arthritis 10)  Meclizine Hcl 12.5 Mg Tabs (Meclizine Hcl) .[name].. 1-2 By Mouth Qid As Needed Dizziness 11)  Aleve 220 Mg Tabs (Naproxen Sodium) .... As Needed 12)  Metoprolol Tartrate 25 Mg Tabs (Metoprolol Tartrate) .[name].. 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  The patient denies anorexia, fever, weight loss, weight gain, decreased hearing, chest pain, dyspnea on exertion, peripheral edema, hemoptysis, abdominal pain, transient blindness, depression, unusual weight change, and enlarged lymph nodes.         near syncope on several occasions.   Physical Exam  General:  WNWD white female in no acute distress Head:  normocephalic and atraumatic.   Eyes:  pupils equal and pupils round.   Lungs:  normal respiratory effort and normal breath sounds.   Heart:  normal  rate, regular rhythm, no murmur, no gallop, and no JVD.   Abdomen:  soft, non-tender, normal bowel sounds, no guarding, and no rebound tenderness.   Rectal:  no external abnormalities, normal sphincter tone, and no masses.  Stool heme negative Neurologic:  alert & oriented X3, cranial nerves II-XII intact, and gait normal.   Skin:  turgor normal and color normal.   Cervical Nodes:  no anterior cervical adenopathy and no posterior cervical adenopathy.   Psych:  Oriented X3, memory intact for recent and remote, normally interactive, and good eye contact.     Impression & Recommendations:  Problem # 1:  SYNCOPE (ICD-780.2) Patient with a constellation of symptoms as noted. Her exam is unrevealing: normal rate and rythym. Stool heme negative. Suspect hematochezia related to hemorrhoidal bleed due to frequent stooling. Seh has had a recent colonoscopy that was norma.  Near syncope may be rhythm related: bradycardia vs PAF although she has not noted a rapid heart rate but rather a slow heart rate.  Plan - safe to return home           keep appointment for 2D echo Tuesday, Aug 16th           May need reassessment by EP prior to scheduled follow-up in October           Continue ASA.   Complete Medication List: 1)  Prilosec 40 Mg Cpdr (Omeprazole) .... Two times a day 2)  Vitamin B-12 Cr 1000 Mcg Tbcr (Cyanocobalamin) .... Take one tablet by mouth daily 3)  Aspirin 325 Mg Tabs (Aspirin) .[name].. 1 tab daily 4)  Centrum Silver Ultra Womens Tabs (Multiple vitamins-minerals) .[name].. 1 qd 5)  Simvastatin 40 Mg Tabs (Simvastatin) .... Take one tablet at bedtime 6)  Atelvia 35 Mg Tbec (Risedronate sodium) .[name].. 1 by mouth once a month 7)  Vitamin D3 1000 Unit Caps (Cholecalciferol) .[name].. 1 by mouth qd 8)  Omega-3 Cf 1000 Mg Caps (Omega-3 fatty acids) .[name].. 1 by mouth qod 9)  Naproxen 500 Mg Tabs (Naproxen) .[name].. 1 by mouth two times a day pc for pain/arthritis 10)  Meclizine Hcl 12.5 Mg Tabs (Meclizine hcl) .[name]..  1-2 by mouth qid as needed dizziness 11)  Aleve 220 Mg Tabs (Naproxen sodium) .... As needed 12)  Metoprolol Tartrate 25 Mg Tabs (Metoprolol tartrate) .[name].. 1 tab once daily

## 2010-09-16 NOTE — Assessment & Plan Note (Signed)
Summary: 3 mo rov /nws  #   Vital Signs:  Patient profile:   77 year old female Weight:      155 pounds BMI:     25.89 O2 Sat:      96 % on Room air Temp:     98.5 degrees F oral Resp:     16 per minute BP sitting:   118 / 70  (right arm) Cuff size:   regular  Vitals Entered By: Jonathon Resides, CMA(AAMA) (February 07, 2010 9:14 AM)  O2 Flow:  Room air CC: 3 mo f/u  Comments pt states she is not taking ASA or Omega 3 Fish Oil  and she takes 1/2 tablet of Simvastatin 49m   Primary Care Provider:  ACassandria AngerMD  CC:  3 mo f/u .  History of Present Illness: The patient presents for a follow up of OA, B12 def, hyperlipidemia C/o R knee swelling - saw an ortho - had an inj. C/o vertigo x 2 wks  Current Medications (verified): 1)  Prilosec 40 Mg  Cpdr (Omeprazole) .... Two Times A Day 2)  Vitamin B-12 Cr 1000 Mcg  Tbcr (Cyanocobalamin) .... Take One Tablet By Mouth Daily 3)  Aspirin 81 Mg  Tbec (Aspirin) .... One By Mouth Every Day 4)  Centrum Silver Ultra Womens  Tabs (Multiple Vitamins-Minerals) ..[name]. 1 Qd 5)  Simvastatin 40 Mg Tabs (Simvastatin) .... Take One Tablet At Bedtime 6)  Atelvia 35 Mg Tbec (Risedronate Sodium) ..[name]. 1 By Mouth Once A Month 7)  Vitamin D3 1000 Unit Caps (Cholecalciferol) ..[name]. 1 By Mouth Qd 8)  Omega-3 Cf 1000 Mg Caps (Omega-3 Fatty Acids) ..[name]. 1 By Mouth Qod  Allergies (verified): 1)  Augmentin 2)  Simvastatin  Past History:  Past Medical History: Last updated: 06/05/2009 Colonic polyps, hx of Diverticulosis, colon GERD Osteoarthritis Osteoporosis Mild TR Vit B12 def Vit D def Breast cancer, hx of Hyperlipidemia Familial tremor GYN DR GCherylann BanasLow back pain Mac degeneration, cataracts  Review of Systems  The patient denies anorexia, fever, dyspnea on exertion, and abdominal pain.    Physical Exam  General:  alert and overweight-appearing.   Mouth:  no gingival abnormalities and pharynx pink and moist.   Lungs:  normal  respiratory effort and normal breath sounds.   Heart:  normal rate and regular rhythm.   Abdomen:  soft, non-tender, normal bowel sounds, no distention, and no masses.   Msk:  L knee is less swollen, NT Neurologic:  No cranial nerve deficits noted. Station and gait are a little ataxicl. Plantar reflexes are down-going bilaterally. DTRs are symmetrical throughout. Sensory, motor and coordinative functions appear intact. Skin:  right lateral lower leg with 2 bite sites with mild to mod tender and erythema, non fluctuant, no pus, no lymphangitic streaks, no overall leg swelling, gait normal Psych:  Cognition and judgment appear intact. Alert and cooperative with normal attention span and concentration. No apparent delusions, illusions, hallucinations   Impression & Recommendations:  Problem # 1:  KNEE PAIN (ICD-719.46) R and swelling Assessment Unchanged  Her updated medication list for this problem includes:    Aspirin 81 Mg Tbec (Aspirin) ..... One by mouth every day    Naproxen 500 Mg Tabs (Naproxen) ..[name].. 1 by mouth two times a day pc for pain/arthritis  Problem # 2:  VERTIGO (ICD-780.4) BPV Assessment: New Use stretching and balance exercises that I have provided (15 min. or longer every day)  BLaruth Bouchard- Daroff exercise was given  to the patient  Her updated medication list for this problem includes:    Meclizine Hcl 12.5 Mg Tabs (Meclizine hcl) .[name]... 1-2 by mouth qid as needed dizziness  Problem # 3:  LOW BACK PAIN (ICD-724.2) Assessment: Improved  Her updated medication list for this problem includes:    Aspirin 81 Mg Tbec (Aspirin) ..... One by mouth every day    Naproxen 500 Mg Tabs (Naproxen) .[name]... 1 by mouth two times a day pc for pain/arthritis  Problem # 4:  DIARRHEA (ICD-787.91) Assessment: Improved  Problem # 5:  HYPERLIPIDEMIA (ICD-272.4) Assessment: Unchanged  Her updated medication list for this problem includes:    Simvastatin 40 Mg Tabs (Simvastatin) .[name]... Take  one tablet at bedtime  Labs Reviewed: SGOT: 33 (11/19/2009)   SGPT: 28 (11/19/2009)   HDL:37.80 (02/03/2010), 39.70 (11/19/2009)  LDL:105 (02/03/2010), 88 (11/19/2009)  Chol:164 (02/03/2010), 146 (11/19/2009)  Trig:108.0 (02/03/2010), 91.0 (11/19/2009)  Complete Medication List: 1)  Prilosec 40 Mg Cpdr (Omeprazole) .... Two times a day 2)  Vitamin B-12 Cr 1000 Mcg Tbcr (Cyanocobalamin) .... Take one tablet by mouth daily 3)  Aspirin 81 Mg Tbec (Aspirin) .... One by mouth every day 4)  Centrum Silver Ultra Womens Tabs (Multiple vitamins-minerals) .[name].. 1 qd 5)  Simvastatin 40 Mg Tabs (Simvastatin) .... Take one tablet at bedtime 6)  Atelvia 35 Mg Tbec (Risedronate sodium) .[name].. 1 by mouth once a month 7)  Vitamin D3 1000 Unit Caps (Cholecalciferol) .[name].. 1 by mouth qd 8)  Omega-3 Cf 1000 Mg Caps (Omega-3 fatty acids) .[name].. 1 by mouth qod 9)  Naproxen 500 Mg Tabs (Naproxen) .[name].. 1 by mouth two times a day pc for pain/arthritis 10)  Meclizine Hcl 12.5 Mg Tabs (Meclizine hcl) .[name].. 1-2 by mouth qid as needed dizziness  Patient Instructions: 1)  Please schedule a follow-up appointment in 6 months well w/labs. 2)  Laruth Bouchard - Daroff exercise was given to you to do daily Prescriptions: MECLIZINE HCL 12.5 MG TABS (MECLIZINE HCL) 1-2 by mouth qid as needed dizziness  #60 x 1   Entered and Authorized by:   Cassandria Anger MD   Signed by:   Cassandria Anger MD on 02/11/2010   Method used:   Electronically to        [id number] (retail)       [place], [place]  [address]       Ph: [phone] or [phone]       Fax: [phone]   RxID:   [id number] NAPROXEN 500 MG TABS (NAPROXEN) 1 by mouth two times a day pc for pain/arthritis  #60 x 3   Entered and Authorized by:   Cassandria Anger MD   Signed by:   Cassandria Anger MD on 02/07/2010   Method used:   Electronically to        [id number] (retail)       [place]  [address]       Ph: [phone] or [phone]       Fax: [phone]   RxID:   [id number]

## 2010-09-16 NOTE — Progress Notes (Signed)
   Information Faxed to [employer] LOV,Doppler,Echo,12 lead.Marland KitchenMarland KitchenRequest sent to [facility]. [facility] [place]  June 18, 2010 9:10 AM

## 2010-09-16 NOTE — Assessment & Plan Note (Signed)
Summary: 6 mo rov /nws #   Vital Signs:  Patient profile:   77 year old female Height:      65 inches Weight:      154.75 pounds BMI:     25.84 O2 Sat:      93 % on Room air Temp:     98.0 degrees F oral Pulse rate:   51 / minute BP sitting:   134 / 66  (right arm) Cuff size:   large  Vitals Entered By: Ernestene Mention (November 28, 2009 8:18 AM)  O2 Flow:  Room air CC: 6 mo f/u--Pt would like to discuss recent testing, meds, PM edema, and occasional SOB/weakness./kb Is Patient Diabetic? No Pain Assessment Patient in pain? no        Primary Care Provider:  Cassandria Anger MD  CC:  6 mo f/u--Pt would like to discuss recent testing, meds, PM edema, and and occasional SOB/weakness./kb.  History of Present Illness: The patient presents for a follow up of GERD, LBP, hyperlipidemia C/o tremor in L hand  Current Medications (verified): 1)  Prilosec 40 Mg  Cpdr (Omeprazole) .... Two Times A Day 2)  Vitamin B-12 Cr 1000 Mcg  Tbcr (Cyanocobalamin) .... Take One Tablet By Mouth Daily 3)  Aspirin 81 Mg  Tbec (Aspirin) .... One By Mouth Every Day 4)  Centrum Silver Ultra Womens  Tabs (Multiple Vitamins-Minerals) .[name].. 1 Qd 5)  Simvastatin 40 Mg Tabs (Simvastatin) .... Take One Tablet At Bedtime 6)  Atelvia 35 Mg Tbec (Risedronate Sodium) .[name].. 1 By Mouth Once A Month 7)  Vitamin D3 1000 Unit Caps (Cholecalciferol) .[name].. 1 By Mouth Qd 8)  Omega-3 Cf 1000 Mg Caps (Omega-3 Fatty Acids) .[name].. 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    Physical Exam  General:  alert and overweight-appearing.   Mouth:  no gingival abnormalities and pharynx pink and moist.   Lungs:  normal respiratory effort and normal breath sounds.   Heart:  normal rate and regular rhythm.   Abdomen:  soft, non-tender, normal bowel sounds, no distention, and no masses.   Msk:  Lumbar-sacral spine is tender to palpation over paraspinal muscles and painfull with the ROM  Stiff LS Neurologic:  No cranial nerve deficits noted. Station and gait are normal. Plantar reflexes are down-going bilaterally. DTRs are symmetrical throughout. Sensory, motor and coordinative functions appear intact. Skin:  right lateral lower leg with 2 bite sites with mild to mod tender and erythema, non fluctuant, no pus, no lymphangitic streaks, no overall leg swelling, gait normal Psych:  Cognition and judgment appear intact. Alert and cooperative with normal attention span and concentration. No apparent delusions, illusions, hallucinations   Impression & Recommendations:  Problem # 1:  LOW BACK PAIN (ICD-724.2) Assessment Improved  Her updated medication list for this problem includes:    Aspirin 81 Mg Tbec (Aspirin) ..... One by mouth every day  Problem #  2:  HYPERLIPIDEMIA (ICD-272.4) Assessment: Improved  Her updated medication list for this problem includes:    Simvastatin 40 Mg Tabs (Simvastatin) .[name]... Take one tablet at bedtime  Labs Reviewed: SGOT: 33 (11/19/2009)   SGPT: 28 (11/19/2009)   HDL:39.70 (11/19/2009), 46.40 (06/05/2009)  LDL:88 (11/19/2009), 102 (01/27/2008)  Chol:146 (11/19/2009), 256 (06/05/2009)  Trig:91.0 (11/19/2009), 88.0 (06/05/2009)  Problem # 3:  VITAMIN B12 DEFICIENCY (ICD-266.2) On prescription/OTC   therapy   Problem # 4:  CRAMP OF LIMB (ICD-729.82) Assessment: New She wants to cut back on Zocor  Problem # 5:  LEG PAIN (ICD-729.5) Assessment: New Hold Zocor x 2 wks; restart at 1 by mouth qod  Problem # 6:  VITAMIN D DEFICIENCY (ICD-268.9) Assessment: Improved On prescription/OTC  therapy   Problem # 7:  DIARRHEA (ICD-787.91) Assessment: Improved  Complete Medication List: 1)  Prilosec 40 Mg Cpdr (Omeprazole) .... Two times a day 2)  Vitamin B-12 Cr 1000 Mcg Tbcr (Cyanocobalamin) .... Take one tablet by mouth daily 3)  Aspirin 81 Mg Tbec (Aspirin) .... One by mouth every day 4)  Centrum Silver Ultra Womens Tabs (Multiple vitamins-minerals) .[name].. 1 qd 5)  Simvastatin 40 Mg Tabs (Simvastatin) .... Take one tablet at bedtime 6)  Atelvia 35 Mg Tbec (Risedronate sodium) .[name].. 1 by mouth once a month 7)  Vitamin D3 1000 Unit Caps (Cholecalciferol) .[name].. 1 by mouth qd 8)  Omega-3 Cf 1000 Mg Caps (Omega-3 fatty acids) .[name].. 1 by mouth qod  Patient Instructions: 1)  Please schedule a follow-up appointment in 3 months. 2)  BMP prior to visit, ICD-9: 3)  Lipid Panel prior to visit, ICD-9: 4)  Vit B12 5)  CK 995.20  729.5

## 2010-09-16 NOTE — Letter (Signed)
Summary: [facility]  [facility]   Imported By: Rise Patience 07/02/2010 14:11:56  _____________________________________________________________________  External Attachment:    Type:   Image     Comment:   External Document

## 2010-09-16 NOTE — Progress Notes (Signed)
Summary: ECHO RESULTS   Phone Note Call from Patient Call back at [facility] Phone [phone] Call back at Work Phone [phone]   Caller: Patient Summary of Call: ECHO RESULTS Initial call taken by: Delsa Sale,  April 03, 2010 1:19 PM  Follow-up for Phone Call        gave results  she is still c/o dizziness and wants a sooner apt with Dr Lovena Le  she will see him on 04/08/10 at 1:45 Janan Halter, RN, BSN  April 03, 2010 3:02 PM

## 2010-09-16 NOTE — Assessment & Plan Note (Signed)
Summary: FLU Alyssa Luna   Nurse Visit   Allergies: 1)  Augmentin 2)  Simvastatin  Orders Added: 1)  Flu Vaccine 1yr + MEDICARE PATIENTS [Q2039] 2)  Administration Flu vaccine - MCR [[K2099]Flu Vaccine Consent Questions     Do you have a history of severe allergic reactions to this vaccine? no    Any prior history of allergic reactions to egg and/or gelatin? no    Do you have a sensitivity to the preservative Thimersol? no    Do you have a past history of Guillan-Barre Syndrome? no    Do you currently have an acute febrile illness? no    Have you ever had a severe reaction to latex? no    Vaccine information given and explained to patient? yes    Are you currently pregnant? no    Lot Number:[id number]   Exp Date:02/14/2011   Site Given  Right Deltoid IM .[email or web address]

## 2010-09-16 NOTE — Assessment & Plan Note (Signed)
Summary: EPH-- still c/o dizziness  Medications Added PRILOSEC 20 MG CPDR (OMEPRAZOLE) once daily        Visit Type:  Follow-up Primary Provider:  Cassandria Anger MD   History of Present Illness: Alyssa Luna returns today for followup.  She is a very pleasant 77 yo woman with a h/o PAF, diagnosed several weeks ago when she presented with a symptomatic episode associated with near syncope.  She has been stable except that she had a diarrheal illness after coming home from the hospital.  She denies c/p, sob or peripheral edema.  Current Medications (verified): 1)  Prilosec 20 Mg Cpdr (Omeprazole) .... Once Daily 2)  Vitamin B-12 Cr 1000 Mcg  Tbcr (Cyanocobalamin) .... Take One Tablet By Mouth Daily 3)  Aspirin 325 Mg Tabs (Aspirin) .[name].. 1 Tab Daily 4)  Centrum Silver Ultra Womens  Tabs (Multiple Vitamins-Minerals) .[name].. 1 Qd 5)  Simvastatin 40 Mg Tabs (Simvastatin) .... Take One Tablet At Bedtime 6)  Atelvia 35 Mg Tbec (Risedronate Sodium) .[name].. 1 By Mouth Once A Month 7)  Vitamin D3 1000 Unit Caps (Cholecalciferol) .[name].. 1 By Mouth Qd 8)  Aleve 220 Mg Tabs (Naproxen Sodium) .... As Needed  Allergies: 1)  Augmentin 2)  Simvastatin  Past History:  Past Medical History: Last updated: 06/05/2009 Colonic polyps, hx of Diverticulosis, colon GERD Osteoarthritis Osteoporosis Mild TR Vit B12 def Vit D def Breast cancer, hx of Hyperlipidemia Familial tremor GYN DR Cherylann Banas Low back pain Mac degeneration, cataracts  Past Surgical History: Last updated: 11/28/2009 Hysterectomy Breast Augmentation Cataract extraction  Review of Systems  The patient denies chest pain, syncope, dyspnea on exertion, and peripheral edema.    Vital Signs:  Patient profile:   77 year old female Height:      65 inches Weight:      155 pounds BMI:     25.89 Pulse rate:   60 / minute BP sitting:   120 / 80  (left arm)  Vitals Entered By: Margaretmary Bayley CMA (April 08, 2010 1:59  PM)  Physical Exam  General:  WNWD white female in no acute distress Head:  normocephalic and atraumatic.   Eyes:  pupils equal and pupils round.   Mouth:  no gingival abnormalities and pharynx pink and moist.   Neck:  supple, full ROM, and no masses.   Lungs:  normal respiratory effort and normal breath sounds.   Heart:  normal rate, regular rhythm, no murmur, no gallop, and no JVD.   Abdomen:  soft, non-tender, normal bowel sounds, no guarding, and no rebound tenderness.   Pulses:  R and L carotid,radial,femoral,dorsalis pedis and posterior tibial pulses are full and equal bilaterally Extremities:  No clubbing, cyanosis, edema, or deformity noted with normal full range of motion of all joints.   Neurologic:  alert & oriented X3, cranial nerves II-XII intact, and gait normal.     EKG  Procedure date:  04/08/2010  Findings:      Normal sinus rhythm with rate of: 60.   Impression & Recommendations:  Problem # 1:  ATRIAL FIBRILLATION (ICD-427.31) Her symptoms are well controlled at this point.  I have recommended a period of watchful waiting and I have asked her to let me know if she has more atrial fib. The following medications were removed from the medication list:    Metoprolol Tartrate 25 Mg Tabs (Metoprolol tartrate) .[name]... 1 tab once daily Her updated medication list for this problem includes:    Aspirin 325 Mg  Tabs (Aspirin) .[name]... 1 tab daily  Problem # 2:  SYNCOPE (ICD-780.2) She actually has not passed out though she has had orthostatic dizziness.  An increase in her salt and fluid intake has been recommended. The following medications were removed from the medication list:    Metoprolol Tartrate 25 Mg Tabs (Metoprolol tartrate) .[name]... 1 tab once daily Her updated medication list for this problem includes:    Aspirin 325 Mg Tabs (Aspirin) .[name]... 1 tab daily  Problem # 3:  HYPERLIPIDEMIA (ICD-272.4) She is instructed to maintain a low fat diet. Her updated medication  list for this problem includes:    Simvastatin 40 Mg Tabs (Simvastatin) .[name]... Take one tablet at bedtime  Patient Instructions: 1)  Your physician recommends that you schedule a follow-up appointment in: 6 months with Dr Lovena Le 2)  Your physician recommends that you continue on your current medications as directed. Please refer to the Current Medication list given to you today.

## 2010-09-16 NOTE — Miscellaneous (Signed)
Summary: Orders Update  Clinical Lists Changes  Orders: Added new Test order of Venous Duplex Lower Extremity (Venous Duplex Lower) - Signed

## 2010-09-16 NOTE — Progress Notes (Signed)
Summary: refill  Phone Note Call from Patient Call back at Home Phone [phone]   Caller: Patient Reason for Call: Talk to Nurse Summary of Call: request 30 day supply of SIM[facility]STATIN [facility] JDarnell Level  May 27, 2010 8:23 AM    Prescriptions: SIMVASTATIN 40 MG TABS (SIMVASTATIN) Take one tablet at bedtime  #90 x 3   Entered by:   LEstell HarpinCMA   Authorized by:   ACassandria AngerMD   Signed by:   LEstell HarpinCMA on 05/27/2010   Method used:   Electronically to        [id number] ([facility])       [address]  [address]      Ph: [phone]      Fax: [phone]  RxID:   [id number]

## 2010-09-16 NOTE — Assessment & Plan Note (Signed)
Summary: cold,cough/cd   Vital Signs:  Patient profile:   77 year old female Height:      65 inches Weight:      156 pounds O2 Sat:      96 % on Room air Temp:     98.0 degrees F oral Pulse rate:   56 / minute BP sitting:   140 / 82  (left arm) Cuff size:   regular  Vitals Entered By: Charlynne Cousins CMA (May 02, 2010 8:20 AM)  O2 Flow:  Room air CC: cold & cough Is Patient Diabetic? No Pain Assessment Patient in pain? no        Primary Care Provider:  Evie Lacks Plotnikov MD  CC:  cold & cough.  History of Present Illness: The patient presents with complaints of sore throat, fever, cough, sinus congestion and drainge of 10 days duration. Not better with OTC meds. Chest hurts with coughing. Can't sleep due to cough. Muscle aches are present.  The mucus is colored.  C/o R knee pain and swelling - same with L ankle C/o L and R calves swelling and pain  Current Medications (verified): 1)  Prilosec 20 Mg Cpdr (Omeprazole) .... Once Daily 2)  Vitamin B-12 Cr 1000 Mcg  Tbcr (Cyanocobalamin) .... Take One Tablet By Mouth Daily 3)  Aspirin 325 Mg Tabs (Aspirin) .[name].. 1 Tab Daily 4)  Centrum Silver Ultra Womens  Tabs (Multiple Vitamins-Minerals) .[name].. 1 Qd 5)  Simvastatin 40 Mg Tabs (Simvastatin) .... Take One Tablet At Bedtime 6)  Atelvia 35 Mg Tbec (Risedronate Sodium) .[name].. 1 By Mouth Once A Month 7)  Vitamin D3 1000 Unit Caps (Cholecalciferol) .[name].. 1 By Mouth Qd 8)  Aleve 220 Mg Tabs (Naproxen Sodium) .... As Needed 9)  Metoprolol Succinate 25 Mg Xr24h-Tab (Metoprolol Succinate) .... One By Mouth Daily  Allergies (verified): 1)  Augmentin 2)  Simvastatin  Past History:  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 Smoked Drug use-no Regular exercise-no  Past Medical History: Colonic polyps, hx of Diverticulosis, colon GERD Osteoarthritis Osteoporosis Mild TR Vit B12 def Vit D def Breast cancer, hx of Hyperlipidemia Familial  tremor GYN DR Cherylann Banas Low back pain Mac degeneration, cataracts A fib Dr Lovena Le  Physical Exam  General:  WNWD white female in no acute distress Mouth:  Erythematous throat and intranasal mucosa c/w URI  Lungs:  L base crackles Heart:  RRR Msk:  Calves NT B R knee w/trace swelling and pain w/ROM Extremities:  trace right pedal edema.     Impression & Recommendations:  Problem # 1:  BRONCHITIS, ACUTE (ICD-466.0) Assessment New  Her updated medication list for this problem includes:    Zithromax Z-pak 250 Mg Tabs (Azithromycin) .[name]... As dirrected    Promethazine-codeine 6.25-10 Mg/33m Syrp (Promethazine-codeine) ..[name].. 5-10 ml by mouth q id as needed cough  Orders: T-2 View CXR, Same Day ([id number])  Problem # 2:  EDEMA (ICD-782.3) R LE Assessment: New  Orders: Doppler Referral (Doppler)  Problem # 3:  LEG PAIN (ICD-729.5) R  leg - likely MSK Assessment: New  Orders: Doppler Referral (Doppler)  Problem # 4:  ATRIAL FIBRILLATION (ICD-427.31) Assessment: Unchanged  Her updated medication list for this problem includes:    Aspirin 325 Mg Tabs (Aspirin) ..[name].. 1 tab daily    Metoprolol Succinate 25 Mg Xr24h-tab (Metoprolol succinate) ..... One by mouth daily  Complete Medication List: 1)  Prilosec 20 Mg Cpdr (Omeprazole) .... Once daily 2)  Vitamin B-12 Cr 1000 Mcg  Tbcr (Cyanocobalamin) .... Take one tablet by mouth daily 3)  Aspirin 325 Mg Tabs (Aspirin) .[name].. 1 tab daily 4)  Centrum Silver Ultra Womens Tabs (Multiple vitamins-minerals) .[name].. 1 qd 5)  Simvastatin 40 Mg Tabs (Simvastatin) .... Take one tablet at bedtime 6)  Atelvia 35 Mg Tbec (Risedronate sodium) .[name].. 1 by mouth once a month 7)  Vitamin D3 1000 Unit Caps (Cholecalciferol) .[name].. 1 by mouth qd 8)  Metoprolol Succinate 25 Mg Xr24h-tab (Metoprolol succinate) .... One by mouth daily 9)  Zithromax Z-pak 250 Mg Tabs (Azithromycin) .... As dirrected 10)  Ibuprofen 600 Mg Tabs (Ibuprofen) .[name].. 1 by mouth bid   pc x 1 wk then as needed for  pain 11)  Promethazine-codeine 6.25-10 Mg/86m Syrp (Promethazine-codeine) .... 5-10 ml by mouth q id as needed cough  Patient Instructions: 1)  Call if you are not better in a reasonable amount of time or if worse.  Prescriptions: ZITHROMAX Z-PAK 250 MG TABS (AZITHROMYCIN) as dirrected  #1 x 1   Entered and Authorized by:   ACassandria AngerMD   Signed by:   ACassandria AngerMD on 05/02/2010   Method used:   Electronically to        [id number] ([facility])       [address] [place]  [address]      Ph: [phone]      Fax: [phone]  RxID:   [id number] 600 MG TABS (IBUPROFEN) 1 by mouth bid  pc x 1 wk then as needed for  pain  #60 x 3   Entered and Authorized by:   ACassandria AngerMD   Signed by:   ACassandria AngerMD on 05/02/2010   Method used:   Electronically to        [id number] ([facility]ail)       [address]  [address]      Ph: [phone]      Fax: [phone]  RxID:   [id number] MG/5ML SYRP (PROMETHAZINE-CODEINE) 5-10 ml by mouth q id as needed cough  #300 ml x 0   Entered and Authorized by:   ACassandria AngerMD   Signed by:   ACassandria AngerMD on 05/02/2010   Method used:   Print then Give to Patient   RxID::   [id number] 600 MG TABS (IBUPROFEN) 1 by mouth bid  pc x 1 wk then as needed for  pain  #60 x 3   Entered and Authorized by:   ACassandria AngerMD   Signed by:   ACassandria AngerMD on 05/02/2010   Method used:   Print then Give to Patient   RxID:   [id number] 250 MG TABS (AZITHROMYCIN) as dirrected  #1 x 1   Entered and Authorized by:   ACassandria AngerMD   Signed by:   ACassandria AngerMD on 05/02/2010   Method used:   Print then Give to Patient   RxID:   [id number]

## 2010-09-16 NOTE — Progress Notes (Signed)
Summary: Question about A/Fib  Medications Added METOPROLOL SUCCINATE 25 MG XR24H-TAB (METOPROLOL SUCCINATE) one by mouth daily       Phone Note Call from Patient Call back at [facility] Phone [phone] Call back at Work Phone [phone]   Caller: Patient Summary of Call: Pt have question s about a/fib Initial call taken by: Delsa Sale,  April 10, 2010 9:24 AM  Follow-up for Phone Call        pt will go ahead and start Metoprolol 56m daily.   KJanan Halter RN, BSN  April 10, 2010 12:20 PM    New/Updated Medications: METOPROLOL SUCCINATE 25 MG XR24H-TAB (METOPROLOL SUCCINATE) one by mouth daily

## 2010-09-16 NOTE — Miscellaneous (Signed)
Summary: Appointment Canceled  Appointment status changed to canceled by [employer] on 03/26/2010 3:54 PM.  Cancellation Comments --------------------- echo/afib  Appointment Information ----------------------- Appt Type:  CARDIOLOGY ANCILLARY VISIT      Date:  Monday, April 07, 2010      Time:  7:30 AM for 60 min   Urgency:  Routine   Made By:  Wynelle Cleveland  To Visit:  [id number]    Reason:  echo/afib  Appt Comments ------------- -- 03/26/10 15:54: ([facility]) CANCELED -- echo/afib -- 03/26/10 8:17: ([facility]) BOOKED -- Routine CARDIOLOGY ANCILLARY VISIT at 04/07/2010 7:30 AM for 60 min echo/afib

## 2010-09-18 ENCOUNTER — Encounter: Payer: Self-pay | Admitting: Cardiology

## 2010-09-18 ENCOUNTER — Encounter (INDEPENDENT_AMBULATORY_CARE_PROVIDER_SITE_OTHER): Payer: Medicare Other

## 2010-09-18 DIAGNOSIS — I4891 Unspecified atrial fibrillation: Secondary | ICD-10-CM

## 2010-09-18 DIAGNOSIS — Z7901 Long term (current) use of anticoagulants: Secondary | ICD-10-CM

## 2010-09-18 NOTE — Progress Notes (Signed)
Summary: [facility]-Nurse  Phone Note Outgoing Call   Details for Reason: [facility] Triage Call Report Triage Record Num: [id number] Operator: Lynden Oxford Patient Name: Lorann Tani Call Date & Time: 08/07/2010 7:27:00AM Patient Phone: [phone] PCP: Patient Gender: Female PCP Fax : Patient DOB: [birth year] [facility] Name: [facility] Reason for Call: Pt says she has HR of 146-170. Says she has hx of afib. Started this am. Socme dizziness earlier. Sent to ER. Family to take pt. Protocol(s) Used: Irregular Heartbeat Recommended Outcome per Protocol: See ED Immediately Reason for Outcome: Sudden onset of rapid pulse (greater than 120 beats/minute at rest) OR slow pulse (less than 50 beats per minute at rest) Care Advice:  ~ 12/ Summary of Call: Noted Thank you!  Initial call taken by: Cassandria Anger MD,  August 08, 2010 1:06 PM

## 2010-09-18 NOTE — Assessment & Plan Note (Signed)
Summary: ekg. dx: afib. gd  Nurse Visit   Vital Signs:  Patient profile:   77 year old female Height:      65 inches Weight:      150.50 pounds Pulse rate:   50 / minute Resp:     14 per minute BP sitting:   110 / 70  (left arm)  Vitals Entered By: Mikle Bosworth RN (September 05, 2010 10:30 AM) Comments No cardiac complaints today. Patient came in for a scheduled EKG which showed sinus bradycardia.  BP 110/70 with a HR 50. Dr. Aundra Dubin signed  the EKG and will be scanned into EMR.   Allergies: 1)  Augmentin

## 2010-09-18 NOTE — Progress Notes (Signed)
Summary: DOPPLER  Phone Note Call from Patient   Summary of Call: Patient is requesting a call back regarding doppler.  Initial call taken by: Charlsie Quest, [place],  July 31, 2010 3:51 PM  Follow-up for Phone Call        Spoke w/pt, advised her to keep apt tomorrow. Surgery is next monday Follow-up by: Charlsie Quest, CMA,  July 31, 2010 3:54 PM

## 2010-09-18 NOTE — Medication Information (Signed)
Summary: rov/ewj  Anticoagulant Therapy  Managed by: Porfirio Oar, PharmD Referring MD: Lovena Le, MD PCP: Cassandria Anger MD Supervising MD: Ron Parker MD, Dellis Filbert Indication 1: Atrial Fibrillation Lab Used: [facility] [place] Site: [place] INR POC 1.9 INR RANGE 2-3  Dietary changes: no    Health status changes: no    Bleeding/hemorrhagic complications: no    Recent/future hospitalizations: no    Any changes in medication regimen? no    Recent/future dental: no  Any missed doses?: no       Is patient compliant with meds? yes       Allergies: 1)  Augmentin  Anticoagulation Management History:      The patient is taking warfarin and comes in today for a routine follow up visit.  Positive risk factors for bleeding include an age of 9 years or older.  The bleeding index is 'intermediate risk'.  Negative CHADS2 values include Age > 26 years old.  Anticoagulation responsible provider: Ron Parker MD, Dellis Filbert.  INR POC: 1.9.  Cuvette Lot#: [id number].  Exp: 08/2011.    Anticoagulation Management Assessment/Plan:      The patient's current anticoagulation dose is Coumadin 3 mg tabs: take 1 1/2 once daily as directed by coumadin clinic.  The target INR is 2.0-3.0.  The next INR is due 09/18/2010.  Anticoagulation instructions were given to patient.  Results were reviewed/authorized by Porfirio Oar, PharmD.  She was notified by Nilda Simmer, PharmD Candidate .         Prior Anticoagulation Instructions: INR 1.6  Take 1.5 tablets daily until recheck on Thursday 09/11/10.  Continue Lovenox injections once daily until recheck.    Current Anticoagulation Instructions: INR 1.9  Stop Lovenox shots   Coumadin 3 mg tablets - Increase dose to 1.5 tablets every day except 2 tablets on Mondays and Thursdays

## 2010-09-18 NOTE — Miscellaneous (Signed)
Summary: Orders Update  Clinical Lists Changes  Orders: Added new Test order of Venous Duplex Lower Extremity (Venous Duplex Lower) - Signed

## 2010-09-18 NOTE — Medication Information (Signed)
Summary: ccn.gd  Anticoagulant Therapy  Managed by: Porfirio Oar, PharmD Referring MD: Lovena Le, MD PCP: Cassandria Anger MD Supervising MD: Ron Parker MD, Dellis Filbert Indication 1: Atrial Fibrillation  Site: [place] INR POC 1.3 INR RANGE 2-3  Dietary changes: no    Health status changes: no    Bleeding/hemorrhagic complications: no    Recent/future hospitalizations: yes       Details: Pt hospitalized from 1/17 to 1/18 for DCCV.  discharged on Coumadin and Lovenox. INR on discharge was 1.0.  Any changes in medication regimen? no    Recent/future dental: no  Any missed doses?: no       Is patient compliant with meds? yes      Comments: Pt discharged on Lovenox 9m two times a day.  She has only 1 more shot left.  Will change to Lovenox 1060monce daily (1.29m57mg/day dose) until INR >2 to decrease number of injections needed.  [facility] update medication list b/c MD note still on hold)  Allergies: 1)  Augmentin  Anticoagulation Management History:      The patient comes in today for her initial visit for anticoagulation therapy.  Positive risk factors for bleeding include an age of 65 13ars or older.  The bleeding index is 'intermediate risk'.  Negative CHADS2 values include Age > 75 18ars old.  Anticoagulation responsible provider: KatRon Parker, JefDellis FilbertINR POC: 1.3.  Cuvette Lot#: [id number]  Anticoagulation Management Assessment/Plan:      The target INR is 2.0-3.0.  The next INR is due 09/08/2010.  Anticoagulation instructions were given to patient.  Results were reviewed/authorized by SalPorfirio OarharmD.  She was notified by SalPorfirio OararmD.         Current Anticoagulation Instructions: INR 1.3 (INR goal: 2-3)  Take an extra half tablet today.  Resume normal schedule of 1 tablet everyday tomorrow.  Inject Lovenox 100 mg subcutaneously everyday.  Recheck on Monday, January 23 at 9:30 am.

## 2010-09-18 NOTE — Progress Notes (Signed)
Summary: Records Request   Faxed Stress to [name] (Per Baxter Flattery) at [facility] ([phone]).  Alyssa Luna  July 29, 2010 12:56 PM

## 2010-09-18 NOTE — Assessment & Plan Note (Signed)
Summary: add-on per pt call  Medications Added SIMVASTATIN 40 MG TABS (SIMVASTATIN) hold ATELVIA 35 MG TBEC (RISEDRONATE SODIUM) hold        Primary Provider:  Cassandria Anger MD  CC:  c/o fast HR.  History of Present Illness: Alyssa Luna is seen as an addon because of tachypalpitations.    She is a   77 yo woman with a h/o PAF, diagnosed last summer when she presented with a symptomatic episode associated with near syncope. She recently underwent knee replacement which was covered with xiralto stopeed 12 days ago.  She has had ipsilateral swelling and undergone venous doppler which are neg.    She awakened sunday with tackypalps,  they persisted all day long and were not evident on Mon/  They were evident again today and she feels lousy with shortness of breath.     S    Problems Prior to Update: 1)  Nausea  (ICD-787.02) 2)  Fatigue  (ICD-780.79) 3)  Cramp of Limb  (ICD-729.82) 4)  Edema  (ICD-782.3) 5)  Carotid Bruit  (ICD-785.9) 6)  Knee Pain  (ICD-719.46) 7)  Health Maintenance Exam  (ICD-V70.0) 8)  Leg Pain  (ICD-729.5) 9)  Edema  (ICD-782.3) 10)  Bronchitis, Acute  (ICD-466.0) 11)  Atrial Fibrillation  (ICD-427.31) 12)  Syncope  (ICD-780.2) 13)  Vertigo  (ICD-780.4) 14)  Knee Pain  (ICD-719.46) 15)  Diarrhea  (ICD-787.91) 16)  Cramp of Limb  (ICD-729.82) 17)  Low Back Pain  (ICD-724.2) 18)  Chronic Maxillary Sinusitis  (ICD-473.0) 19)  Effusion of Joint Other Specified Site  (ICD-719.08) 20)  Bite By Unspecified Animal  (ICD-E906.5) 21)  Contraception Counseling/family Planning  (ICD-V25.09) 22)  Abnormal Chest Xray  (ICD-793.1) 23)  Abdominal Pain  (ICD-789.00) 24)  Cough  (ICD-786.2) 25)  Leg Pain  (ICD-729.5) 26)  Hyperlipidemia  (ICD-272.4) 27)  Hematuria, Microscopic, Hx of  (ICD-V13.09) 28)  Tricuspid Regurgitation, Mild  (ICD-397.0) 29)  Vitamin B12 Deficiency  (ICD-266.2) 30)  Vitamin D Deficiency  (ICD-268.9) 31)  Neoplasm, Malignant, Breast, Hx  of  (ICD-V10.3) 32)  Osteoporosis  (ICD-733.00) 33)  Osteoarthritis  (ICD-715.90) 34)  Gerd  (ICD-530.81) 35)  Diverticulosis, Colon  (ICD-562.10) 36)  Colonic Polyps, Hx of  (ICD-V12.72) 37)  Breast Cancer, Hx of  (ICD-V10.3)  Current Medications (verified): 1)  Prilosec 20 Mg Cpdr (Omeprazole) .... Once Daily 2)  Vitamin B-12 Cr 1000 Mcg  Tbcr (Cyanocobalamin) .... Take One Tablet By Mouth Daily 3)  Aspirin 325 Mg Tabs (Aspirin) .[name].. 1 Tab Daily 4)  Simvastatin 40 Mg Tabs (Simvastatin) .... Hold 5)  Atelvia 35 Mg Tbec (Risedronate Sodium) .... Hold 6)  Vitamin D3 1000 Unit Caps (Cholecalciferol) .[name].. 1 By Mouth Qd 7)  Metoprolol Succinate 25 Mg Xr24h-Tab (Metoprolol Succinate) .... One By Mouth Daily 8)  Icaps  Caps (Multiple Vitamins-Minerals) .[name].. 1 By Mouth Once Daily 9)  Hydrocodone-Acetaminophen 5-325 Mg Tabs (Hydrocodone-Acetaminophen) .[name].. 1-2 Every 4to 6 Shours As Needed Pain 10)  Furosemide 20 Mg Tabs (Furosemide) .[name].. 1 By Mouth Qam As Needed For Swelling As Needed X2-3 D 11)  Cyclobenzaprine Hcl 10 Mg Tabs (Cyclobenzaprine Hcl) .... 1/2-1 Tab By Mouth Two Times A Day As Needed Muscle Spasms  Allergies: 1)  Augmentin  Past History:  Past Medical History: Last updated: 05/02/2010 Colonic polyps, hx of Diverticulosis, colon GERD Osteoarthritis Osteoporosis Mild TR Vit B12 def Vit D def Breast cancer, hx of Hyperlipidemia Familial tremor GYN DR Gottsegen Low back pain Mac degeneration,  cataracts A fib Dr Lovena Le  Past Surgical History: Last updated: 08/26/2010 Hysterectomy Breast Augmentation Cataract extraction Total knee replacement R Dec 2012 Dr Noemi Chapel  Family History: Last updated: 11/28/2009 Family History Hypertension, tremor  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 Smoked Drug use-no Regular exercise-no  Review of Systems       Review of systmes ws broadly neg except ss outlined above  Vital  Signs:  Patient profile:   77 year old female Height:      65 inches Weight:      148 pounds BMI:     24.72 Pulse rate:   160 / minute Pulse rhythm:   irregular BP supine:   122 / 82  (left arm)  Vitals Entered By: Alyssa Halter, RN, BSN (September 02, 2010 1:44 PM) CC: c/o fast HR Comments pt took Xarelto 13m from 12/21-1/3 after knee surgery  states she felt this on Sun and was okay on Mon and came back today KJanan Halter RN, BSN  September 02, 2010 1:50 PM   Physical Exam  General:  Well developed, well nourished, in no acute distress. Head:  nl HEENT Neck:  supple without thryomeglay, venous distension Chest Wall:  without kkyphosis Lungs:  clear Heart:  rapi and irreglular,  no mufrmer Abdomen:  soft, non tender BS+ Msk:  swollen right knee without erhythma Pulses:  intact Extremities:  no cce Neurologic:  alert and oriented X3  grossly normal motor and sensory funciton   EKG  Procedure date:  09/02/2010  Findings:      afib RVR 160 -/.07/.46 o/w ok  Impression & Recommendations:  Problem # 1:  ATRIAL FIBRILLATION (ICD-427.31) recurrent atrial fibrilltion of prob >48 hrs duration.  I ofered xiralto and outpt TEE in 48 hrs or inpatient hep/coumadin and TEE in am  She prefers the latter aas she feels terrible.   Chart review  nl TSH last week , nrl LV functino insummer wt modest LA and RA enlargement Her updated medication list for this problem includes:    Aspirin 325 Mg Tabs (Aspirin) ..[name].. 1 tab daily    Metoprolol Succinate 25 Mg Xr24h-tab (Metoprolol succinate) ..... One by mouth daily  Problem # 2:  EDEMA (ICD-782.3) with neg doppler

## 2010-09-18 NOTE — Letter (Signed)
Summary: [facility] Visit Note   [facility] Visit Note   Imported By: Sallee Provencal 08/05/2010 16:25:37  _____________________________________________________________________  External Attachment:    Type:   Image     Comment:   External Document

## 2010-09-18 NOTE — Assessment & Plan Note (Signed)
Summary: 2 WK FU---STC   Vital Signs:  Patient profile:   77 year old female Height:      65 inches Weight:      148 pounds BMI:     24.72 Temp:     98.2 degrees F oral Pulse rate:   68 / minute Pulse rhythm:   regular Resp:     16 per minute BP sitting:   122 / 84  (left arm) Cuff size:   regular  Vitals Entered By: Jonathon Resides, CMA(AAMA) (September 08, 2010 10:17 AM) CC: 2 wk f/u  Is Patient Diabetic? No Comments pt is not taking Furosemide or Cyclobenazaprine.    Primary Care Provider:  Cassandria Anger MD  CC:  2 wk f/u .  History of Present Illness: The patient presents for a post-hospital visit for DISCHARGE DIAGNOSES: 1. Atrial fibrillation with rapid ventricular response, converted to     normal sinus rhythm prior to TEE cardioversion.     a.     Discharged with Lovenox to Coumadin crossover. 2. Gastroesophageal reflux disease. 3. Hyperlipidemia. 4. History of breast cancer. 5. Recent right knee replacement, August 04, 2010, with negative     lower venous Dopplers, August 28, 2010. 6. History of colonic polyps and diverticulosis. 7. Osteoporosis. 8. Osteoporosis. 9. Mild tricuspid regurgitation. 10.Vitamin B12 deficiency. 11.Vitamin D deficiency. 12.Familial tremor. 13.Low back pain. 14.Macular degeneration with history of cataracts. 15.Status post hysterectomy. 16.Status post breast augmentation. 17.Status post cataract extraction.  Current Medications (verified): 1)  Prilosec 20 Mg Cpdr (Omeprazole) .... Once Daily 2)  Vitamin B-12 Cr 1000 Mcg  Tbcr (Cyanocobalamin) .... Take One Tablet By Mouth Daily 3)  Aspir-Low 81 Mg Tbec (Aspirin) .[name].. 1 By Mouth Once Daily 4)  Simvastatin 40 Mg Tabs (Simvastatin) .... Hold 5)  Atelvia 35 Mg Tbec (Risedronate Sodium) .... Hold 6)  Vitamin D3 1000 Unit Caps (Cholecalciferol) .[name].. 1 By Mouth Qd 7)  Metoprolol Succinate 25 Mg Xr24h-Tab (Metoprolol Succinate) .... One By Mouth Daily 8)  Icaps  Caps (Multiple  Vitamins-Minerals) .[name].. 1 By Mouth Once Daily 9)  Hydrocodone-Acetaminophen 5-325 Mg Tabs (Hydrocodone-Acetaminophen) .[name].. 1-2 Every 4to 6 Shours As Needed Pain 10)  Furosemide 20 Mg Tabs (Furosemide) .[name].. 1 By Mouth Qam As Needed For Swelling As Needed X2-3 D 11)  Cyclobenzaprine Hcl 10 Mg Tabs (Cyclobenzaprine Hcl) .... 1/2-1 Tab By Mouth Two Times A Day As Needed Muscle Spasms 12)  Flecainide Acetate 50 Mg Tabs (Flecainide Acetate) .... 2 By Mouth Once Daily 13)  Coumadin 3 Mg Tabs (Warfarin Sodium) .... Take 1 1/2 Once Daily As Directed By Coumadin Clinic 14)  Metoprolol Tartrate 25 Mg Tabs (Metoprolol Tartrate) .[name].. 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    Physical Exam  General:  WNWD white female in no acute distress Head:  normocephalic and atraumatic.   Nose:  External nasal examination shows no deformity or inflammation. Nasal mucosa are pink and moist without lesions or exudates.  Mouth:  WNL Neck:  carot bruit L mild Lungs:  Normal respiratory effort, chest expands symmetrically. Lungs are clear to auscultation, no crackles or wheezes. Heart:  Normal rate and regular rhythm. S1 and S2 normal without gallop, murmur, click, rub or other extra sounds. Abdomen:  Bowel sounds positive,abdomen soft and non-tender without masses, organomegaly or hernias noted. Msk:   Using a cane R knee is less swollen, tender with a scar Extremities:  trace edema R leg up to knee, NT   L leg - no edema Neurologic:  No cranial nerve deficits noted. Station and gait are normal. Plantar reflexes are down-going bilaterally. DTRs are symmetrical throughout. Sensory, motor and coordinative functions appear intact. Skin:  Intact without suspicious lesions or rashes Psych:  Cognition and judgment appear intact. Alert and cooperative with normal attention span and concentration. No apparent delusions, illusions, hallucinations   Impression & Recommendations:  Problem # 1:  ATRIAL FIBRILLATION (ICD-427.31) Assessment Deteriorated S/p hosp stay - she was hosp for A fib. Hosp records/labs/tests - reviewed Her updated medication list for this problem includes:    Aspir-low 81 Mg Tbec (Aspirin) .[name]... 1 by mouth once daily    Metoprolol Succinate 25 Mg Xr24h-tab (Metoprolol succinate) ..... One by mouth daily    Flecainide Acetate 50 Mg Tabs (Flecainide acetate) .[name]... 2 by mouth once daily    Coumadin 3 Mg Tabs (Warfarin sodium) .[name]... Take 1 1/2 once daily as directed by coumadin clinic    Metoprolol Tartrate 25 Mg Tabs (Metoprolol tartrate) .[name]... 1 by mouth two times a day  Problem # 2:  LEG PAIN (ICD-729.5) Assessment: Improved On the regimen of medicine(s) reflected in the chart    Problem # 3:  FATIGUE (ICD-780.79) Assessment: Improved  Problem # 4:  EDEMA (ICD-782.3) Assessment: Improved  Her updated medication list for this problem includes:    Furosemide 20 Mg Tabs (Furosemide) .[name]... 1 by mouth qam as needed for swelling as needed x2-3 d - restart w/caution  Problem # 5:  COUMADIN THERAPY (ICD-V58.61) Assessment: New On the regimen of medicine(s) reflected in the chart    Problem # 6:  CAROTID BRUIT (ICD-785.9) Carot doppler reviewed w/pt  Complete Medication List: 1)  Prilosec 20 Mg Cpdr (Omeprazole) .... Once daily 2)  Vitamin B-12 Cr 1000 Mcg Tbcr  (Cyanocobalamin) .... Take one tablet by mouth daily 3)  Aspir-low 81 Mg Tbec (Aspirin) .[name].. 1 by mouth once daily 4)  Simvastatin 40 Mg Tabs (Simvastatin) .... Hold 5)  Atelvia 35 Mg Tbec (Risedronate sodium) .... Hold 6)  Vitamin D3 1000 Unit Caps (Cholecalciferol) .[name].. 1 by mouth qd 7)  Metoprolol Succinate 25 Mg Xr24h-tab (Metoprolol succinate) .... One by mouth daily 8)  Icaps Caps (Multiple vitamins-minerals) .[name].. 1 by mouth once daily 9)  Hydrocodone-acetaminophen 5-325 Mg Tabs (Hydrocodone-acetaminophen) .[name].. 1-2 every 4to 6 shours as needed pain 10)  Furosemide 20 Mg Tabs (Furosemide) .[name].. 1 by mouth qam as needed for swelling as needed x2-3 d 11)  Cyclobenzaprine Hcl 10 Mg Tabs (Cyclobenzaprine hcl) .... 1/2-1 tab by mouth two times a day as needed muscle spasms 12)  Flecainide Acetate 50 Mg Tabs (Flecainide acetate) .... 2 by mouth once daily 13)  Coumadin 3 Mg Tabs (Warfarin sodium) .... Take 1 1/2 once daily as directed by coumadin clinic 14)  Metoprolol Tartrate 25 Mg Tabs (Metoprolol tartrate) .[name].. 1 by mouth two times a day  Patient Instructions: 1)  Resart Furosemide once daily or every other day  2)  Keep R leg elevated 3)  Please schedule a follow-up appointment in 6 weeks. 4)  BMP prior to visit, ICD-9:782.3 5)  Hepatic Panel prior to visit, ICD-9:272.0 6)  Lipid Panel prior to visit, ICD-9:   Orders Added: 1)  Est. Patient Level V [91504]

## 2010-09-18 NOTE — Assessment & Plan Note (Signed)
Summary: 6 MO ROV /NWS   Vital Signs:  Patient profile:   77 year old female Height:      65 inches Weight:      156 pounds BMI:     26.05 Temp:     98.5 degrees F oral Pulse rate:   80 / minute Pulse rhythm:   regular Resp:     16 per minute BP sitting:   130 / 70  (left arm) Cuff size:   regular  Vitals Entered By: Jonathon Resides, Gabrielle Dare) (July 28, 2010 11:12 AM) CC: MWV Is Patient Diabetic? No   Primary Care Provider:  Cassandria Anger MD  CC:  MWV.  History of Present Illness: The patient presents for a preventive health examination  Patient past medical history, social history, and family history reviewed in detail no significant changes.  Patient is physically active. Depression is negative and mood is good. Hearing is normal, and able to perform activities of daily living. Risk of falling is moderate due to R knee and home safety has been reviewed and is appropriate. Patient has normal height, weight, and visual acuity is nl w/glasses. Patient has been counseled on age-appropriate routine health concerns for screening and prevention. Education, counseling done. Cognition is good. C/o R knee pain. F/u B12 def, OA, A fib  Preventive Screening-Counseling & Management  Alcohol-Tobacco     Alcohol drinks/day: 0     Smoking Status: never  Caffeine-Diet-Exercise     Caffeine Counseling: not indicated; caffeine use is not excessive or problematic     Does Patient Exercise: no     Exercise Counseling: to improve exercise regimen     Depression Counseling: not indicated; screening negative for depression  Hep-HIV-STD-Contraception     Hepatitis Risk: no risk noted     Sun Exposure-Excessive: no  Safety-Violence-Falls     Seat Belt Use: yes     Fall Risk Counseling: counseling provided; falls with injury noted      Sexual History:  currently monogamous.    Current Medications (verified): 1)  Prilosec 20 Mg Cpdr (Omeprazole) .... Once Daily 2)  Vitamin B-12  Cr 1000 Mcg  Tbcr (Cyanocobalamin) .... Take One Tablet By Mouth Daily 3)  Aspirin 325 Mg Tabs (Aspirin) .[name].. 1 Tab Daily 4)  Centrum Silver Ultra Womens  Tabs (Multiple Vitamins-Minerals) .[name].. 1 Qd 5)  Simvastatin 40 Mg Tabs (Simvastatin) .... Take One Tablet At Bedtime 6)  Atelvia 35 Mg Tbec (Risedronate Sodium) .[name].. 1 By Mouth Once A Month 7)  Vitamin D3 1000 Unit Caps (Cholecalciferol) .[name].. 1 By Mouth Qd 8)  Metoprolol Succinate 25 Mg Xr24h-Tab (Metoprolol Succinate) .... One By Mouth Daily 9)  Ibuprofen 600 Mg Tabs (Ibuprofen) .[name].. 1 By Mouth Bid  Pc X 1 Wk Then As Needed For  Pain 10)  Promethazine-Codeine 6.25-10 Mg/65m Syrp (Promethazine-Codeine) .... 5-10 Ml By Mouth Q Id As Needed Cough 11)  Icaps  Caps (Multiple Vitamins-Minerals) ..[name]. 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  The patient denies anorexia, fever, weight loss, weight gain, vision loss, decreased hearing, hoarseness, chest pain, syncope, dyspnea on exertion, peripheral edema, prolonged cough, headaches, hemoptysis, abdominal pain, melena, hematochezia, severe  indigestion/heartburn, hematuria, incontinence, genital sores, muscle weakness, suspicious skin lesions, transient blindness, depression, unusual weight change, abnormal bleeding, enlarged lymph nodes, angioedema, and breast masses.    Physical Exam  General:  WNWD white female in no acute distress Head:  normocephalic and atraumatic.   Eyes:  pupils equal and pupils round.  vision grossly intact, pupils equal, and pupils round.   Ears:  R ear normal and L ear normal.   Nose:  External nasal examination shows no deformity or inflammation. Nasal mucosa are pink and moist without lesions or exudates. Mouth:  WNL Neck:  carot bruit L mild Lungs:  Normal respiratory effort, chest expands symmetrically. Lungs are clear to auscultation, no crackles or wheezes. Heart:  Normal rate and regular rhythm. S1 and S2 normal without gallop, murmur, click, rub or other extra sounds. Abdomen:  Bowel sounds positive,abdomen soft and non-tender without masses, organomegaly or hernias noted. Msk:  No deformity or scoliosis noted of thoracic or lumbar spine.   Pulses:  R and L carotid,radial,femoral,dorsalis pedis and posterior tibial pulses are full and equal bilaterally Extremities:  No clubbing, cyanosis, edema, or deformity noted with normal full range of motion of all joints.   Neurologic:  No cranial nerve deficits noted. Station and gait are normal. Plantar reflexes are down-going bilaterally. DTRs are symmetrical throughout. Sensory, motor and coordinative functions appear intact. Skin:  Intact without suspicious lesions or rashes Cervical Nodes:  No lymphadenopathy noted Psych:  Cognition and judgment appear intact. Alert and cooperative with normal attention span and concentration. No apparent delusions, illusions, hallucinations   Impression & Recommendations:  Problem # 1:  HEALTH MAINTENANCE EXAM (ICD-V70.0) Assessment New  Overall doing well, age appropriate education and counseling updated and  referral for appropriate preventive services done unless declined, immunizations up to date or declined, diet counseling done if overweight, urged to quit smoking if smokes, most recent labs reviewed and current ordered if appropriate, ecg reviewed or declined (interpretation per ECG scanned in the EMR if done); information regarding Medicare Preventation requirements given if appropriate.  I have personally reviewed the Medicare Annual Wellness questionnaire and have noted 1.   The patient's medical and social history 2.   Their use of alcohol, tobacco or illicit drugs 3.   Their current medications and supplements 4.   The patient's functional ability including ADL's, fall risks, home safety risks and hearing or visual             impairment. 5.   Diet and physical activities 6.   Evidence for depression or mood disorders  The patients weight, height, BMI and visual acuity have been recorded in the chart I have made referrals, counseling and provided education to the patient based review of the above and I have provided the pt with a written personalized care plan for preventive services. The labs were reviewed with the patient.  She had a BDS PAP w/Dr Cherylann Banas q 2 years  Orders: Medicare -1st Annual Wellness Visit [phone])  Problem # 2:  KNEE PAIN (ICD-719.46) R Assessment: Deteriorated R  TKR is pending next Mon Dr Noemi Chapel Her updated medication list for this problem includes:    Aspirin 325 Mg Tabs (Aspirin) .[name]... 1 tab daily    Ibuprofen 600 Mg Tabs (Ibuprofen) .[name]... 1 by mouth bid  pc x 1 wk then as needed for  pain  Problem # 3:  CAROTID BRUIT (ICD-785.9) R - mild Assessment: New  Orders: Doppler Referral (Doppler)  Problem # 4:  ATRIAL FIBRILLATION (ICD-427.31) Assessment: Comment Only  Her updated medication list for this problem includes:    Aspirin 325 Mg Tabs (Aspirin) .[name]... 1 tab daily    Metoprolol Succinate 25 Mg Xr24h-tab (Metoprolol succinate) ..... One by mouth  daily  Problem # 5:  VITAMIN B12 DEFICIENCY (ICD-266.2) Assessment: Comment Only On the regimen of medicine(s) reflected in the chart    Complete Medication List: 1)  Prilosec 20 Mg Cpdr (Omeprazole) .... Once daily 2)  Vitamin B-12 Cr 1000 Mcg Tbcr (Cyanocobalamin) .... Take one tablet by mouth daily 3)  Aspirin 325 Mg Tabs (Aspirin) .[name].. 1 tab daily 4)  Centrum Silver Ultra Womens Tabs (Multiple vitamins-minerals) .[name].. 1 qd 5)  Simvastatin 40 Mg Tabs (Simvastatin) .... Take one tablet at bedtime 6)  Atelvia 35 Mg Tbec (Risedronate sodium) .[name].. 1 by mouth once a month 7)  Vitamin D3 1000 Unit Caps (Cholecalciferol) .[name].. 1 by mouth qd 8)  Metoprolol Succinate 25 Mg Xr24h-tab (Metoprolol succinate) .... One by mouth daily 9)  Ibuprofen 600 Mg Tabs (Ibuprofen) .[name].. 1 by mouth bid  pc x 1 wk then as needed for  pain 10)  Promethazine-codeine 6.25-10 Mg/70m Syrp (Promethazine-codeine) .... 5-10 ml by mouth q id as needed cough 11)  Icaps Caps (Multiple vitamins-minerals) ..[name]. 1 by mouth once daily  Patient Instructions: 1)  Please schedule a follow-up appointment in 4 months.   Orders Added: 1)  Doppler Referral [Doppler] 2)  Medicare -1st Annual Wellness Visit [[[id number]]3)  Est. Patient Level IV [[73710]

## 2010-09-18 NOTE — Miscellaneous (Signed)
Summary: Orders Update  Clinical Lists Changes  Orders: Added new Test order of Carotid Duplex (Carotid Duplex) - Signed 

## 2010-09-18 NOTE — Progress Notes (Signed)
Summary: REFERRAL  Phone Note Call from Patient   Caller: [phone] cell Reason for Call: Referral Summary of Call: pt scheduled for   arterial carotid doppler US at 08/08/2010 2:00 Pm - pt having Knee replacement surgery On this Monday want to know if this [place] is still Necessary  to have pls advise pt wants a call from nurse  Initial call taken by: Nonah Mattes,  July 29, 2010 9:12 AM  Follow-up for Phone Call        Can we do it this week? Thank you!  Follow-up by: Cassandria Anger MD,  July 29, 2010 3:56 PM  Additional Follow-up for Phone Call Additional follow up Details #1::        [facility] working on earlier apt Additional Follow-up by: Charlsie Quest, CMA,  July 30, 2010 12:19 PM    Additional Follow-up for Phone Call Additional follow up Details #2::    Earliest apt 08/01/10. Is this ok? ......................Marland KitchenCharlsie Quest, CMA  July 30, 2010 2:56 PM   OK Pls call me preliminary report Thank you!  Evie Lacks Plotnikov MD  July 30, 2010 5:41 PM   Surgery is the 12th, Ok to proceed?..............Marland KitchenCharlsie Quest, CMA  July 30, 2010 5:50 PM   Additional Follow-up for Phone Call Additional follow up Details #3:: Details for Additional Follow-up Action Taken: I'm sorry - If surgery is done - we can do carotid doppler in 1-2 months when she is mobile. Thank you!   Left detailed vm on pt's cell................Marland KitchenCharlsie Quest, CMA  July 31, 2010 1:45 PM  Additional Follow-up by: Cassandria Anger MD,  July 31, 2010 12:44 PM

## 2010-09-18 NOTE — Medication Information (Signed)
Summary: rov/sp  Anticoagulant Therapy  Managed by: Freddrick March, RN, BSN Referring MD: Lovena Le, MD PCP: Cassandria Anger MD Supervising MD: Harrington Challenger MD, Nevin Bloodgood Indication 1: Atrial Fibrillation Lab Used: [facility] [place] Site: [place] INR POC 1.6 INR RANGE 2-3  Dietary changes: no    Health status changes: yes       Details: Pt states she feels weak.    Bleeding/hemorrhagic complications: no    Recent/future hospitalizations: no    Any changes in medication regimen? no    Recent/future dental: no  Any missed doses?: no       Is patient compliant with meds? yes      Comments: Pt has 3 doses of Lovenox left.   Allergies: 1)  Augmentin  Anticoagulation Management History:      The patient is taking warfarin and comes in today for a routine follow up visit.  Positive risk factors for bleeding include an age of 75 years or older.  The bleeding index is 'intermediate risk'.  Negative CHADS2 values include Age > 75 years old.  Anticoagulation responsible provider: Harrington Challenger MD, Nevin Bloodgood.  INR POC: 1.6.  Cuvette Lot#: [id number].  Exp: 08/2011.    Anticoagulation Management Assessment/Plan:      The target INR is 2.0-3.0.  The next INR is due 09/11/2010.  Anticoagulation instructions were given to patient.  Results were reviewed/authorized by Freddrick March, RN, BSN.  She was notified by Freddrick March RN.         Prior Anticoagulation Instructions: INR 1.3 (INR goal: 2-3)  Take an extra half tablet today.  Resume normal schedule of 1 tablet everyday tomorrow.  Inject Lovenox 100 mg subcutaneously everyday.  Recheck on Monday, January 23 at 9:30 am.    Current Anticoagulation Instructions: INR 1.6  Take 1.5 tablets daily until recheck on Thursday 09/11/10.  Continue Lovenox injections once daily until recheck.

## 2010-09-18 NOTE — Assessment & Plan Note (Signed)
Summary: TIRED AFTER KNEE REPLACEMENT SURGERY/NWS   Vital Signs:  Patient profile:   77 year old female Height:      65 inches Weight:      156 pounds BMI:     26.05 Temp:     97.4 degrees F oral Pulse rate:   88 / minute Pulse rhythm:   regular Resp:     16 per minute BP sitting:   122 / 78  (left arm) Cuff size:   regular  Vitals Entered By: Jonathon Resides, CMA(AAMA) (August 26, 2010 3:32 PM) CC: no appetite, no taste, muscle cramps in Rt thigh "feels bad since having TKR on 08-04-10" Is Patient Diabetic? No   Primary Care Provider:  Cassandria Anger MD  CC:  no appetite, no taste, and muscle cramps in Rt thigh "feels bad since having TKR on 08-04-10".  History of Present Illness: C/o no taste and no appetite, fatigue, no taste C/o dry mouth She had a URI and went to [facility]  on Dec 23 - given Avelox for pneumonia.  Current Medications (verified): 1)  Prilosec 20 Mg Cpdr (Omeprazole) .... Once Daily 2)  Vitamin B-12 Cr 1000 Mcg  Tbcr (Cyanocobalamin) .... Take One Tablet By Mouth Daily 3)  Aspirin 325 Mg Tabs (Aspirin) .[name].. 1 Tab Daily 4)  Centrum Silver Ultra Womens  Tabs (Multiple Vitamins-Minerals) .[name].. 1 Qd 5)  Simvastatin 40 Mg Tabs (Simvastatin) .... Take One Tablet At Bedtime 6)  Atelvia 35 Mg Tbec (Risedronate Sodium) .[name].. 1 By Mouth Once A Month 7)  Vitamin D3 1000 Unit Caps (Cholecalciferol) .[name].. 1 By Mouth Qd 8)  Metoprolol Succinate 25 Mg Xr24h-Tab (Metoprolol Succinate) .... One By Mouth Daily 9)  Ibuprofen 600 Mg Tabs (Ibuprofen) .[name].. 1 By Mouth Bid  Pc X 1 Wk Then As Needed For  Pain 10)  Promethazine-Codeine 6.25-10 Mg/51m Syrp (Promethazine-Codeine) .... 5-10 Ml By Mouth Q Id As Needed Cough 11)  Icaps  Caps (Multiple Vitamins-Minerals) ..[name]. 1 By Mouth Once Daily 12)  Hydrocodone-Acetaminophen 5-325 Mg Tabs (Hydrocodone-Acetaminophen) ..[name]. 1-2 Every 4to 6 Shours As Needed Pain  Allergies (verified): 1)  Augmentin 2)  Simvastatin  Past History:  Past  Medical History: Last updated: 05/02/2010 Colonic polyps, hx of Diverticulosis, colon GERD Osteoarthritis Osteoporosis Mild TR Vit B12 def Vit D def Breast cancer, hx of Hyperlipidemia Familial tremor GYN DR Gottsegen Low back pain Mac degeneration, cataracts A fib Dr TLovena Le Family History: Last updated: 11/28/2009 Family History Hypertension, tremor  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 Smoked Drug use-no Regular exercise-no  Past Surgical History: Hysterectomy Breast Augmentation Cataract extraction Total knee replacement R Dec 2012 Dr WNoemi Chapel Review of Systems       The patient complains of peripheral edema and difficulty walking.  The patient denies fever, chest pain, dyspnea on exertion, prolonged cough, and severe indigestion/heartburn.    Physical Exam  General:  WNWD white female in no acute distress Head:  normocephalic and atraumatic.   Eyes:  pupils equal and pupils round.  vision grossly intact, pupils equal, and pupils round.   Ears:  R ear normal and L ear normal.   Nose:  External nasal examination shows no deformity or inflammation. Nasal mucosa are pink and moist without lesions or exudates. Mouth:  WNL Neck:  carot bruit L mild Lungs:  Normal respiratory effort, chest expands symmetrically. Lungs are clear to auscultation, no crackles or wheezes. Heart:  Normal rate and regular rhythm. S1 and  S2 normal without gallop, murmur, click, rub or other extra sounds. Abdomen:  Bowel sounds positive,abdomen soft and non-tender without masses, organomegaly or hernias noted. Msk:  Using a walker  R knee is swollen, tender with a scar Extremities:  2+ edema R leg up to mid-thigh (trace), NT   L leg - no edema Neurologic:  No cranial nerve deficits noted. Station and gait are normal. Plantar reflexes are down-going bilaterally. DTRs are symmetrical throughout. Sensory, motor and coordinative functions appear  intact. Skin:  Intact without suspicious lesions or rashes Cervical Nodes:  No lymphadenopathy noted Psych:  Cognition and judgment appear intact. Alert and cooperative with normal attention span and concentration. No apparent delusions, illusions, hallucinations   Impression & Recommendations:  Problem # 1:  KNEE PAIN (ICD-719.46) post-op Assessment Improved  The following medications were removed from the medication list:    Ibuprofen 600 Mg Tabs (Ibuprofen) .[name]... 1 by mouth bid  pc x 1 wk then as needed for  pain Her updated medication list for this problem includes:    Aspirin 325 Mg Tabs (Aspirin) .[name]... 1 tab daily    Hydrocodone-acetaminophen 5-325 Mg Tabs (Hydrocodone-acetaminophen) .[name]... 1-2 every 4to 6 shours as needed pain    Cyclobenzaprine Hcl 10 Mg Tabs (Cyclobenzaprine hcl) .[name]... 1/2-1 tab by mouth two times a day as needed muscle spasms  Problem # 2:  EDEMA (ICD-782.3) R leg Assessment: Deteriorated  Her updated medication list for this problem includes:    Furosemide 20 Mg Tabs (Furosemide) .[name]... 1 by mouth qam as needed for swelling as needed x2-3 d  Orders: T-D-Dimer Fibrin Derivatives Quantitive [id number]) TLB-BMP (Basic Metabolic Panel-BMET) (70177-LTJQZES) TLB-BNP (B-Natriuretic Peptide) (83880-BNPR) TLB-CBC Platelet - w/Differential (85025-CBCD) TLB-Hepatic/Liver Function Pnl (80076-HEPATIC) TLB-Sedimentation Rate (ESR) (85652-ESR) TLB-TSH (Thyroid Stimulating Hormone) (84443-TSH) TLB-Udip ONLY (81003-UDIP) Doppler Referral (Doppler)  Problem # 3:  CRAMP OF LIMB (ICD-729.82) Assessment: New  Orders: T-D-Dimer Fibrin Derivatives Quantitive ([id number]) TLB-BMP (Basic Metabolic Panel-BMET) (63335-KTGYBWL) TLB-BNP (B-Natriuretic Peptide) (83880-BNPR) TLB-CBC Platelet - w/Differential (85025-CBCD) TLB-Hepatic/Liver Function Pnl (80076-HEPATIC) TLB-Sedimentation Rate (ESR) (85652-ESR) TLB-TSH (Thyroid Stimulating Hormone) (84443-TSH) TLB-Udip ONLY  (81003-UDIP) Doppler Referral (Doppler)  Problem # 4:  Elev D dimer (1/11) Assessment: New The labs were reviewed with the patient on the phone. Ven doppler US ordered. No PE symptoms   Problem # 5:  NAUSEA (ICD-787.02) poss due to pain meds Assessment: New See "Patient Instructions".  Orders: T-D-Dimer Fibrin Derivatives Quantitive [id number]) TLB-BMP (Basic Metabolic Panel-BMET) (81157-WIOMBTD) TLB-BNP (B-Natriuretic Peptide) (83880-BNPR) TLB-CBC Platelet - w/Differential (85025-CBCD) TLB-Hepatic/Liver Function Pnl (80076-HEPATIC) TLB-Sedimentation Rate (ESR) (85652-ESR) TLB-TSH (Thyroid Stimulating Hormone) (84443-TSH) TLB-Udip ONLY (81003-UDIP)  Problem # 6:  FATIGUE (ICD-780.79) multifactorial Assessment: Deteriorated  Orders: T-D-Dimer Fibrin Derivatives Quantitive ([id number]) TLB-BMP (Basic Metabolic Panel-BMET) (64680-HOZYYQM) TLB-BNP (B-Natriuretic Peptide) (83880-BNPR) TLB-CBC Platelet - w/Differential (85025-CBCD) TLB-Hepatic/Liver Function Pnl (80076-HEPATIC) TLB-Sedimentation Rate (ESR) (85652-ESR) TLB-TSH (Thyroid Stimulating Hormone) (84443-TSH) TLB-Udip ONLY (81003-UDIP)  Complete Medication List: 1)  Prilosec 20 Mg Cpdr (Omeprazole) .... Once daily 2)  Vitamin B-12 Cr 1000 Mcg Tbcr (Cyanocobalamin) .... Take one tablet by mouth daily 3)  Aspirin 325 Mg Tabs (Aspirin) .[name].. 1 tab daily 4)  Centrum Silver Ultra Womens Tabs (Multiple vitamins-minerals) .[name].. 1 qd 5)  Simvastatin 40 Mg Tabs (Simvastatin) .... Take one tablet at bedtime 6)  Atelvia 35 Mg Tbec (Risedronate sodium) .[name].. 1 by mouth once a month 7)  Vitamin D3 1000 Unit Caps (Cholecalciferol) .[name].. 1 by mouth qd 8)  Metoprolol Succinate 25 Mg Xr24h-tab (Metoprolol succinate) .... One by  mouth daily 9)  Icaps Caps (Multiple vitamins-minerals) .[name].. 1 by mouth once daily 10)  Hydrocodone-acetaminophen 5-325 Mg Tabs (Hydrocodone-acetaminophen) .[name].. 1-2 every 4to 6 shours as needed pain 11)  Furosemide  20 Mg Tabs (Furosemide) .[name].. 1 by mouth qam as needed for swelling as needed x2-3 d 12)  Cyclobenzaprine Hcl 10 Mg Tabs (Cyclobenzaprine hcl) .... 1/2-1 tab by mouth two times a day as needed muscle spasms  Patient Instructions: 1)  Hold Simvastatin 2)  Elevate L leg 3)  Please schedule a follow-up appointment in 2 weeks Prescriptions: CYCLOBENZAPRINE HCL 10 MG TABS (CYCLOBENZAPRINE HCL) 1/2-1 tab by mouth two times a day as needed muscle spasms  #60 x 1   Entered and Authorized by:   Cassandria Anger MD   Signed by:   Cassandria Anger MD on 08/26/2010   Method used:   Electronically to        [id number]* (retail)       [place], [place]  [address]       Ph: [phone] or [phone]       Fax: [phone]   RxID:   [id number] FUROSEMIDE 20 MG TABS (FUROSEMIDE) 1 by mouth qam as needed for swelling as needed x2-3 d  #30 x 1   Entered and Authorized by:   Cassandria Anger MD   Signed by:   Cassandria Anger MD on 08/26/2010   Method used:   Electronically to        [id number]* (retail)       [place], [place]  [address]       Ph: [phone] or [phone]       Fax: [phone]   RxID:   [id number]    Orders Added: 1)  T-D-Dimer Fibrin Derivatives Quantitive [[id number]] 2)  TLB-BMP (Basic Metabolic Panel-BMET) [27782-UMPNTIR] 3)  TLB-BNP (B-Natriuretic Peptide) [83880-BNPR] 4)  TLB-CBC Platelet - w/Differential [85025-CBCD] 5)  TLB-Hepatic/Liver Function Pnl [80076-HEPATIC] 6)  TLB-Sedimentation Rate (ESR) [85652-ESR] 7)  TLB-TSH (Thyroid Stimulating Hormone) [84443-TSH] 8)  TLB-Udip ONLY [81003-UDIP] 9)  Doppler Referral [Doppler] 10)  Est. Patient Level V [44315]

## 2010-09-18 NOTE — Progress Notes (Signed)
Summary: pt is in a-fib   Phone Note Call from Patient Call back at Home Phone [phone]   Caller: Patient Reason for Call: Talk to Nurse, Talk to Doctor Summary of Call: pt has had a-fib on the 23rd of Dec and she went to er in [place] and it got ok then she had it Sunday and she has it today and her heart rate is 148 and she took her medication and she wants to talk to someone cause she is concerned. pt has no other symptoms other than shaking. Initial call taken by: Shelda Pal,  September 02, 2010 9:57 AM  Follow-up for Phone Call        pt took her BP and pulse.  It was 135/88 and her HR was 96 I have asked her to take another Metoprolol 29m and I will call her back in an hour our so to see how her HR and BP are Called pt back  she states she feels better  However when she takes it it continues to vary. She is going to come over and I will get an EKG on her and check her BP KJanan Halter RN, BSN  September 02, 2010 11:49 AM pt admitted to [facility] RN, BSN  September 02, 2010 2:28 PM

## 2010-09-22 ENCOUNTER — Telehealth: Payer: Self-pay | Admitting: Internal Medicine

## 2010-09-24 NOTE — Medication Information (Signed)
Summary: [facility]  Anticoagulant Therapy  Managed by: Tula Nakayama, RN, BSN Referring MD: Lovena Le, MD PCP: Cassandria Anger MD Supervising MD: Aundra Dubin MD, Addelyn Alleman Indication 1: Atrial Fibrillation Lab Used: [facility] [place] Site: [place] INR POC 2.2 INR RANGE 2-3  Dietary changes: no    Health status changes: no    Bleeding/hemorrhagic complications: no    Recent/future hospitalizations: no    Any changes in medication regimen? no    Recent/future dental: no  Any missed doses?: no       Is patient compliant with meds? yes       Allergies: 1)  Augmentin  Anticoagulation Management History:      The patient is taking warfarin and comes in today for a routine follow up visit.  Positive risk factors for bleeding include an age of 75 years or older.  The bleeding index is 'intermediate risk'.  Negative CHADS2 values include Age > 27 years old.  Anticoagulation responsible provider: Aundra Dubin MD, Myleka Moncure.  INR POC: 2.2.  Cuvette Lot#: [id number].  Exp: 08/2011.    Anticoagulation Management Assessment/Plan:      The patient's current anticoagulation dose is Coumadin 3 mg tabs: take 1 1/2 once daily as directed by coumadin clinic.  The target INR is 2.0-3.0.  The next INR is due 09/25/2010.  Anticoagulation instructions were given to patient.  Results were reviewed/authorized by Tula Nakayama, RN, BSN.  She was notified by Tula Nakayama, RN, BSN.         Prior Anticoagulation Instructions: INR 1.9  Stop Lovenox shots   Coumadin 3 mg tablets - Increase dose to 1.5 tablets every day except 2 tablets on Mondays and Thursdays   Current Anticoagulation Instructions: INR 2.2 Continue 4.65ms everyday except 679m on Mondays and Thursdays. Recheck in 7-10 days.

## 2010-09-25 ENCOUNTER — Encounter: Payer: Self-pay | Admitting: Internal Medicine

## 2010-09-25 ENCOUNTER — Encounter (INDEPENDENT_AMBULATORY_CARE_PROVIDER_SITE_OTHER): Payer: Medicare Other

## 2010-09-25 DIAGNOSIS — I4891 Unspecified atrial fibrillation: Secondary | ICD-10-CM

## 2010-09-25 DIAGNOSIS — Z7901 Long term (current) use of anticoagulants: Secondary | ICD-10-CM

## 2010-09-29 ENCOUNTER — Encounter: Payer: Self-pay | Admitting: Internal Medicine

## 2010-09-29 ENCOUNTER — Ambulatory Visit (INDEPENDENT_AMBULATORY_CARE_PROVIDER_SITE_OTHER): Payer: Medicare Other | Admitting: Internal Medicine

## 2010-09-29 DIAGNOSIS — I10 Essential (primary) hypertension: Secondary | ICD-10-CM

## 2010-09-29 DIAGNOSIS — I4891 Unspecified atrial fibrillation: Secondary | ICD-10-CM

## 2010-09-29 DIAGNOSIS — E782 Mixed hyperlipidemia: Secondary | ICD-10-CM

## 2010-10-02 NOTE — Progress Notes (Signed)
Summary: refill request  Medications Added COUMADIN 3 MG TABS (WARFARIN SODIUM) Take as directed by Coumadin clinic       Phone Note Refill Request Message from:  Patient on September 22, 2010 11:04 AM  Refills Requested: Medication #1:  COUMADIN 3 MG TABS take 1 1/2 once daily as directed by coumadin clinic pt needs early refill due to increasing to two a day on mon and thurs/[facility] [address]   Method Requested: Telephone to Pharmacy Initial call taken by: Lorenda Hatchet,  September 22, 2010 11:05 AM Caller: Patient Caller: [facility]  [place] [address] [phone]*    New/Updated Medications: COUMADIN 3 MG TABS (WARFARIN SODIUM) Take as directed by Coumadin clinic Prescriptions: COUMADIN 3 MG TABS (WARFARIN SODIUM) Take as directed by Coumadin clinic  #60 x 1   Entered by:   Porfirio Oar PharmD   Authorized by:   Sophronia Simas, MD, [facility]   Signed by:   Porfirio Oar PharmD on 09/22/2010   Method used:   Electronically to        [id number]* (retail)       [place], [place]  [address]       Ph: [phone] or [phone]       Fax: [phone]   RxID:   [id number]

## 2010-10-02 NOTE — Medication Information (Signed)
Summary: Coumadin Clinic  Anticoagulant Therapy  Managed by: Tula Nakayama, RN, BSN Referring MD: Lovena Le, MD PCP: Cassandria Anger MD Supervising MD: Rayann Heman MD, Jeneen Rinks Indication 1: Atrial Fibrillation Lab Used: [facility] [place] Site: [place] INR POC 2.1 INR RANGE 2-3  Dietary changes: no    Health status changes: no    Bleeding/hemorrhagic complications: no    Recent/future hospitalizations: no    Any changes in medication regimen? yes       Details: Planning to restart Simvastatin and Naproxen PRN  Recent/future dental: no  Any missed doses?: no       Is patient compliant with meds? yes       Allergies: 1)  Augmentin  Anticoagulation Management History:      The patient is taking warfarin and comes in today for a routine follow up visit.  Positive risk factors for bleeding include an age of 30 years or older.  The bleeding index is 'intermediate risk'.  Negative CHADS2 values include Age > 60 years old.  Anticoagulation responsible provider: Hanz Winterhalter MD, Jeneen Rinks.  INR POC: 2.1.  Cuvette Lot#: [id number].  Exp: 09/2011.    Anticoagulation Management Assessment/Plan:      The patient's current anticoagulation dose is Coumadin 3 mg tabs: Take as directed by Coumadin clinic.  The target INR is 2.0-3.0.  The next INR is due 10/07/2010.  Anticoagulation instructions were given to patient.  Results were reviewed/authorized by Tula Nakayama, RN, BSN.  She was notified by Tula Nakayama, RN, BSN.         Prior Anticoagulation Instructions: INR 2.2 Continue 4.50ms everyday except 614m on Mondays and Thursdays. Recheck in 7-10 days.   Current Anticoagulation Instructions: INR 2.1 Change dose to 1.5 pills everyday except 2 pills on Tuesdays, Thursdays and Saturdays. Recheck in 12 days.

## 2010-10-03 ENCOUNTER — Telehealth: Payer: Self-pay | Admitting: Internal Medicine

## 2010-10-06 ENCOUNTER — Encounter (INDEPENDENT_AMBULATORY_CARE_PROVIDER_SITE_OTHER): Payer: Medicare Other

## 2010-10-06 ENCOUNTER — Encounter: Payer: Self-pay | Admitting: Cardiovascular Disease

## 2010-10-06 DIAGNOSIS — I4891 Unspecified atrial fibrillation: Secondary | ICD-10-CM

## 2010-10-06 DIAGNOSIS — Z7901 Long term (current) use of anticoagulants: Secondary | ICD-10-CM

## 2010-10-06 LAB — CONVERTED CEMR LAB: POC INR: 2.4

## 2010-10-08 NOTE — Progress Notes (Signed)
Summary: naproxen  Phone Note Refill Request Message from:  Fax from Pharmacy on October 03, 2010 3:44 PM  Refills Requested: Medication #1:  NAPROXEN 500 MG TABS 1 at bedtime   Dosage confirmed as above?Dosage Confirmed [facility]-90 day supply   Method Requested: Fax to [facility] Next Appointment Scheduled: 10/28/2010 Initial call taken by: Rebeca Alert CMA Deborra Medina),  October 03, 2010 3:45 PM    Prescriptions: NAPROXEN 500 MG TABS (NAPROXEN) 1 at bedtime  #90 x 0   Entered by:   Rebeca Alert CMA ([place])   Authorized by:   Cassandria Anger MD   Signed by:   Rebeca Alert CMA ([place]) on 10/03/2010   Method used:   Faxed to ...       [facility] (mail-order)             , [place]         Ph: [phone]       Fax: [phone]   RxID:   [id number]

## 2010-10-08 NOTE — Assessment & Plan Note (Signed)
Summary: F6M ROV.SL=MJ  Medications Added SIMVASTATIN 40 MG TABS (SIMVASTATIN) once daily NAPROXEN 500 MG TABS (NAPROXEN) 1 at bedtime TYLENOL EXTRA STRENGTH 500 MG TABS (ACETAMINOPHEN) as needed      Allergies Added:   Visit Type:  Follow-up Primary Provider:  Evie Lacks Plotnikov MD  CC:  no complaints.  History of Present Illness: Mrs. Hirsch returns today for followup. She is a pleasant 77 yo woman with a h/o PAF, HTN, and dyslipidemia.  The patient has started on low dose flecainide. She denies c/p, sob, or peripipheral edema. She notes some generalized fatigue. No peripheral edema.  Current Medications (verified): 1)  Prilosec 20 Mg Cpdr (Omeprazole) .... Once Daily 2)  Vitamin B-12 Cr 1000 Mcg  Tbcr (Cyanocobalamin) .... Take One Tablet By Mouth Daily 3)  Aspir-Low 81 Mg Tbec (Aspirin) .[name].. 1 By Mouth Once Daily 4)  Simvastatin 40 Mg Tabs (Simvastatin) .... Once Daily 5)  Atelvia 35 Mg Tbec (Risedronate Sodium) .... Hold 6)  Vitamin D3 1000 Unit Caps (Cholecalciferol) .[name].. 1 By Mouth Qd 7)  Metoprolol Succinate 25 Mg Xr24h-Tab (Metoprolol Succinate) .... One By Mouth Daily 8)  Icaps  Caps (Multiple Vitamins-Minerals) .[name].. 1 By Mouth Once Daily 9)  Hydrocodone-Acetaminophen 5-325 Mg Tabs (Hydrocodone-Acetaminophen) .[name].. 1-2 Every 4to 6 Shours As Needed Pain 10)  Furosemide 20 Mg Tabs (Furosemide) .[name].. 1 By Mouth Qam As Needed For Swelling As Needed X2-3 D 11)  Cyclobenzaprine Hcl 10 Mg Tabs (Cyclobenzaprine Hcl) .... 1/2-1 Tab By Mouth Two Times A Day As Needed Muscle Spasms 12)  Flecainide Acetate 50 Mg Tabs (Flecainide Acetate) .... 2 By Mouth Once Daily 13)  Coumadin 3 Mg Tabs (Warfarin Sodium) .... Take As Directed By Coumadin Clinic 14)  Metoprolol Tartrate 25 Mg Tabs (Metoprolol Tartrate) .[name].. 1 By Mouth Two Times A Day 15)  Naproxen 500 Mg Tabs (Naproxen) .[name].. 1 At Bedtime 16)  Tylenol Extra Strength 500 Mg Tabs (Acetaminophen) .... As Needed  Allergies  (verified): 1)  Augmentin  Past History:  Past Medical History: Last updated: 09/08/2010 Colonic polyps, hx of Diverticulosis, colon GERD Osteoarthritis Osteoporosis Mild TR Vit B12 def Vit D def Breast cancer, hx of Hyperlipidemia Familial tremor GYN DR Cherylann Banas Low back pain Mac degeneration, cataracts A fib Dr Lovena Le Atrial fibrillation  Past Surgical History: Last updated: 08/26/2010 Hysterectomy Breast Augmentation Cataract extraction Total knee replacement R Dec 2012 Dr Noemi Chapel  Review of Systems       The patient complains of peripheral edema.  The patient denies chest pain, syncope, and dyspnea on exertion.    Vital Signs:  Patient profile:   77 year old female Height:      65 inches Weight:      148.50 pounds BMI:     24.80 Pulse rate:   60 / minute BP sitting:   148 / 76  (left arm) Cuff size:   regular  Vitals Entered By: Hansel Feinstein CMA (September 29, 2010 11:23 AM)  Physical Exam  General:  WNWD white female in no acute distress Head:  normocephalic and atraumatic.   Eyes:  pupils equal and pupils round.  vision grossly intact. Mouth:  WNL Neck:  carot bruit L mild Chest Wall:  without kkyphosis Lungs:  Normal respiratory effort, chest expands symmetrically. Lungs are clear to auscultation, no crackles or wheezes. Heart:  Normal rate and regular rhythm. S1 and S2 normal without gallop, murmur, click, rub or other extra sounds. Abdomen:  Bowel sounds positive,abdomen soft and non-tender without  masses, organomegaly or hernias noted. Msk:  R knee is less swollen, tender with a scar Pulses:  intact Extremities:  trace edema R leg up to knee, NT   L leg - no edema Neurologic:  No cranial nerve deficits noted. Station and gait are normal. Plantar reflexes are down-going bilaterally. DTRs are symmetrical throughout. Sensory, motor and coordinative functions appear intact.   Impression & Recommendations:  Problem # 1:  ATRIAL FIBRILLATION  (ICD-427.31) Her symptoms appear to be well controlled. Will follow. Her updated medication list for this problem includes:    Aspir-low 81 Mg Tbec (Aspirin) .[name]... 1 by mouth once daily    Metoprolol Succinate 25 Mg Xr24h-tab (Metoprolol succinate) ..... One by mouth daily    Flecainide Acetate 50 Mg Tabs (Flecainide acetate) .[name]... 2 by mouth once daily    Coumadin 3 Mg Tabs (Warfarin sodium) .[name]... Take as directed by coumadin clinic    Metoprolol Tartrate 25 Mg Tabs (Metoprolol tartrate) .[name]... 1 by mouth two times a day  Problem # 2:  HYPERLIPIDEMIA (ICD-272.4) I have asked her to continue her current meds. A low fat diet is requested. Her updated medication list for this problem includes:    Simvastatin 40 Mg Tabs (Simvastatin) ..... Once daily  Problem # 3:  FATIGUE (ICD-780.79) the eitiology is unclear. It could be related to her beta blocker therapy. Will follow.  Patient Instructions: 1)  Your physician wants you to follow-up in: 6 months with Dr Knox Saliva will receive a reminder letter in the mail two months in advance. If you don't receive a letter, please call our office to schedule the follow-up appointment. 2)  Your physician recommends that you continue on your current medications as directed. Please refer to the Current Medication list given to you today.

## 2010-10-13 ENCOUNTER — Telehealth: Payer: Self-pay | Admitting: Internal Medicine

## 2010-10-13 ENCOUNTER — Other Ambulatory Visit: Payer: Self-pay

## 2010-10-14 NOTE — Medication Information (Signed)
Summary: rov/ewj   Anticoagulant Therapy  Managed by: Alycia Rossetti, PharmD Referring MD: Lovena Le, MD PCP: Cassandria Anger MD Supervising MD: Angelena Form MD, Harrell Gave Indication 1: Atrial Fibrillation Lab Used: [facility] Site: [place] INR POC 2.4 INR RANGE 2-3  Dietary changes: no    Health status changes: no    Bleeding/hemorrhagic complications: no    Recent/future hospitalizations: no    Any changes in medication regimen? no    Recent/future dental: no  Any missed doses?: no       Is patient compliant with meds? yes       Allergies: 1)  Augmentin  Anticoagulation Management History:      Positive risk factors for bleeding include an age of 75 years or older.  The bleeding index is 'intermediate risk'.  Negative CHADS2 values include Age > 53 years old.  Anticoagulation responsible provider: Angelena Form MD, Harrell Gave.  INR POC: 2.4.  Cuvette Lot#: [id number].  Exp: 09/2011.    Anticoagulation Management Assessment/Plan:      The patient's current anticoagulation dose is Coumadin 3 mg tabs: Take as directed by Coumadin clinic.  The target INR is 2.0-3.0.  The next INR is due 10/21/2010.  Anticoagulation instructions were given to patient.  Results were reviewed/authorized by Alycia Rossetti, PharmD.  She was notified by Alycia Rossetti PharmD.         Prior Anticoagulation Instructions: INR 2.1 Change dose to 1.5 pills everyday except 2 pills on Tuesdays, Thursdays and Saturdays. Recheck in 12 days.   Current Anticoagulation Instructions: Continue current regimen of 1 1/2 tablets (4.5 mg) daily EXCEPT for 2 tablets (6 mg) on Tuesdays, Thursdays, and Saturdays.  INR 2.4

## 2010-10-20 ENCOUNTER — Ambulatory Visit: Payer: Self-pay | Admitting: Internal Medicine

## 2010-10-20 ENCOUNTER — Encounter: Payer: Self-pay | Admitting: Cardiology

## 2010-10-20 DIAGNOSIS — I4891 Unspecified atrial fibrillation: Secondary | ICD-10-CM

## 2010-10-21 ENCOUNTER — Encounter (INDEPENDENT_AMBULATORY_CARE_PROVIDER_SITE_OTHER): Payer: Self-pay | Admitting: *Deleted

## 2010-10-21 ENCOUNTER — Other Ambulatory Visit: Payer: Medicare Other

## 2010-10-21 ENCOUNTER — Encounter: Payer: Self-pay | Admitting: Cardiology

## 2010-10-21 ENCOUNTER — Encounter (INDEPENDENT_AMBULATORY_CARE_PROVIDER_SITE_OTHER): Payer: Medicare Other

## 2010-10-21 ENCOUNTER — Other Ambulatory Visit: Payer: Self-pay | Admitting: Internal Medicine

## 2010-10-21 DIAGNOSIS — I4891 Unspecified atrial fibrillation: Secondary | ICD-10-CM

## 2010-10-21 DIAGNOSIS — R609 Edema, unspecified: Secondary | ICD-10-CM

## 2010-10-21 DIAGNOSIS — Z7901 Long term (current) use of anticoagulants: Secondary | ICD-10-CM

## 2010-10-21 DIAGNOSIS — E785 Hyperlipidemia, unspecified: Secondary | ICD-10-CM

## 2010-10-21 LAB — HEPATIC FUNCTION PANEL
ALT: 16 U/L (ref 0–35)
AST: 22 U/L (ref 0–37)
Bilirubin, Direct: 0.1 mg/dL (ref 0.0–0.3)
Total Protein: 6.5 g/dL (ref 6.0–8.3)

## 2010-10-21 LAB — BASIC METABOLIC PANEL
BUN: 15 mg/dL (ref 6–23)
CO2: 30 mEq/L (ref 19–32)
Chloride: 106 mEq/L (ref 96–112)
Creatinine, Ser: 0.6 mg/dL (ref 0.4–1.2)
Potassium: 4.2 mEq/L (ref 3.5–5.1)

## 2010-10-21 LAB — LDL CHOLESTEROL, DIRECT: Direct LDL: 120.9 mg/dL

## 2010-10-21 LAB — LIPID PANEL: Triglycerides: 259 mg/dL — ABNORMAL HIGH (ref 0.0–149.0)

## 2010-10-22 ENCOUNTER — Ambulatory Visit: Payer: Self-pay | Admitting: Internal Medicine

## 2010-10-23 NOTE — Progress Notes (Signed)
Summary: pt needs verification of meds asap   Phone Note Refill Request Message from:  Patient  Refills Requested: Medication #1:  METOPROLOL SUCCINATE 25 MG XR24H-TAB one by mouth daily  Medication #2:  METOPROLOL TARTRATE 25 MG TABS 1 by mouth two times a day pt needs to know which one she is to take because the wrong one was sent   Initial call taken by: Shelda Pal,  October 13, 2010 1:41 PM  Follow-up for Phone Call        script was sent to mail order pharmacy Follow-up by: Margaretmary Bayley CMA,  October 15, 2010 1:05 PM

## 2010-10-27 LAB — CBC
HCT: 27.6 % — ABNORMAL LOW (ref 36.0–46.0)
MCV: 89 fL (ref 78.0–100.0)
Platelets: 171 10*3/uL (ref 150–400)
RBC: 3.1 MIL/uL — ABNORMAL LOW (ref 3.87–5.11)
RBC: 3.21 MIL/uL — ABNORMAL LOW (ref 3.87–5.11)
RDW: 13.2 % (ref 11.5–15.5)
RDW: 13.4 % (ref 11.5–15.5)
WBC: 6.6 10*3/uL (ref 4.0–10.5)
WBC: 9.6 10*3/uL (ref 4.0–10.5)

## 2010-10-27 LAB — BASIC METABOLIC PANEL
BUN: 6 mg/dL (ref 6–23)
BUN: 9 mg/dL (ref 6–23)
Calcium: 7.9 mg/dL — ABNORMAL LOW (ref 8.4–10.5)
Chloride: 100 mEq/L (ref 96–112)
Chloride: 104 mEq/L (ref 96–112)
Creatinine, Ser: 0.74 mg/dL (ref 0.4–1.2)
GFR calc Af Amer: >60 mL/min (ref >60)
GFR calc Af Amer: >60 mL/min (ref >60)
GFR calc non Af Amer: >60 mL/min (ref >60)
GFR calc non Af Amer: >60 mL/min (ref >60)
Potassium: 4.3 mEq/L (ref 3.5–5.1)
Sodium: 136 mEq/L (ref 135–145)

## 2010-10-28 ENCOUNTER — Ambulatory Visit (INDEPENDENT_AMBULATORY_CARE_PROVIDER_SITE_OTHER): Payer: Medicare Other | Admitting: Internal Medicine

## 2010-10-28 ENCOUNTER — Encounter: Payer: Self-pay | Admitting: Internal Medicine

## 2010-10-28 DIAGNOSIS — R5381 Other malaise: Secondary | ICD-10-CM

## 2010-10-28 DIAGNOSIS — I4891 Unspecified atrial fibrillation: Secondary | ICD-10-CM

## 2010-10-28 DIAGNOSIS — R609 Edema, unspecified: Secondary | ICD-10-CM

## 2010-10-28 DIAGNOSIS — R5383 Other fatigue: Secondary | ICD-10-CM

## 2010-10-28 DIAGNOSIS — Z7901 Long term (current) use of anticoagulants: Secondary | ICD-10-CM

## 2010-10-28 LAB — COMPREHENSIVE METABOLIC PANEL
ALT: 22 U/L (ref 0–35)
AST: 27 U/L (ref 0–37)
Alkaline Phosphatase: 55 U/L (ref 39–117)
CO2: 27 mEq/L (ref 19–32)
Calcium: 9.4 mg/dL (ref 8.4–10.5)
Chloride: 105 mEq/L (ref 96–112)
GFR calc Af Amer: >60 mL/min (ref >60)
GFR calc non Af Amer: >60 mL/min (ref >60)
Glucose, Bld: 89 mg/dL (ref 70–99)
Sodium: 138 mEq/L (ref 135–145)
Total Bilirubin: 1 mg/dL (ref 0.3–1.2)

## 2010-10-28 LAB — URINALYSIS, ROUTINE W REFLEX MICROSCOPIC
Bilirubin Urine: NEGATIVE
Glucose, UA: NEGATIVE mg/dL
Ketones, ur: NEGATIVE mg/dL
Nitrite: NEGATIVE
Specific Gravity, Urine: 1.013 (ref 1.005–1.030)
pH: 6.5 (ref 5.0–8.0)

## 2010-10-28 LAB — PROTIME-INR
INR: 0.99 (ref 0.00–1.49)
Prothrombin Time: 13.3 seconds (ref 11.6–15.2)

## 2010-10-28 LAB — URINE CULTURE
Colony Count: NO GROWTH
Culture  Setup Time: 201112131341

## 2010-10-28 LAB — DIFFERENTIAL
Basophils Absolute: 0.1 10*3/uL (ref 0.0–0.1)
Basophils Relative: 1 % (ref 0–1)
Eosinophils Absolute: 0.2 10*3/uL (ref 0.0–0.7)
Eosinophils Relative: 3 % (ref 0–5)
Neutrophils Relative %: 45 % (ref 43–77)

## 2010-10-28 LAB — CBC
HCT: 39.9 % (ref 36.0–46.0)
Hemoglobin: 13.1 g/dL (ref 12.0–15.0)
MCHC: 32.8 g/dL (ref 30.0–36.0)
RBC: 4.56 MIL/uL (ref 3.87–5.11)

## 2010-10-28 LAB — SURGICAL PCR SCREEN: MRSA, PCR: NEGATIVE

## 2010-10-28 NOTE — Medication Information (Signed)
Summary: rov/tm      Allergies Added:  Anticoagulant Therapy  Managed by: Ella Jubilee, PharmD Referring MD: Lovena Le, MD PCP: Cassandria Anger MD Supervising MD: Aundra Dubin MD, Timmya Blazier Indication 1: Atrial Fibrillation Lab Used: [facility] [place] Site: [place] INR POC 2.3 INR RANGE 2-3  Dietary changes: no    Health status changes: no    Bleeding/hemorrhagic complications: no    Recent/future hospitalizations: no    Any changes in medication regimen? no    Recent/future dental: no  Any missed doses?: no       Is patient compliant with meds? yes       Current Medications (verified): 1)  Prilosec 20 Mg Cpdr (Omeprazole) .... Once Daily 2)  Vitamin B-12 Cr 1000 Mcg  Tbcr (Cyanocobalamin) .... Take One Tablet By Mouth Daily 3)  Aspir-Low 81 Mg Tbec (Aspirin) .[name].. 1 By Mouth Once Daily 4)  Simvastatin 40 Mg Tabs (Simvastatin) .... Once Daily 5)  Atelvia 35 Mg Tbec (Risedronate Sodium) .... Hold 6)  Vitamin D3 1000 Unit Caps (Cholecalciferol) .[name].. 1 By Mouth Qd 7)  Metoprolol Succinate 25 Mg Xr24h-Tab (Metoprolol Succinate) .... One By Mouth Daily 8)  Icaps  Caps (Multiple Vitamins-Minerals) .[name].. 1 By Mouth Once Daily 9)  Hydrocodone-Acetaminophen 5-325 Mg Tabs (Hydrocodone-Acetaminophen) .[name].. 1-2 Every 4to 6 Shours As Needed Pain 10)  Furosemide 20 Mg Tabs (Furosemide) .[name].. 1 By Mouth Qam As Needed For Swelling As Needed X2-3 D 11)  Cyclobenzaprine Hcl 10 Mg Tabs (Cyclobenzaprine Hcl) .... 1/2-1 Tab By Mouth Two Times A Day As Needed Muscle Spasms 12)  Flecainide Acetate 50 Mg Tabs (Flecainide Acetate) .... 2 By Mouth Once Daily 13)  Coumadin 3 Mg Tabs (Warfarin Sodium) .... Take As Directed By Coumadin Clinic 14)  Metoprolol Tartrate 25 Mg Tabs (Metoprolol Tartrate) .[name].. 1 By Mouth Two Times A Day 15)  Naproxen 500 Mg Tabs (Naproxen) .[name].. 1 At Bedtime 16)  Tylenol Extra Strength 500 Mg Tabs (Acetaminophen) .... As Needed  Allergies (verified): 1)   Augmentin  Anticoagulation Management History:      Positive risk factors for bleeding include an age of 21 years or older.  The bleeding index is 'intermediate risk'.  Positive CHADS2 values include Age > 41 years old.  Anticoagulation responsible provider: Aundra Dubin MD, Aneita Kiger.  INR POC: 2.3.  Exp: 09/2011.    Anticoagulation Management Assessment/Plan:      The patient's current anticoagulation dose is Coumadin 3 mg tabs: Take as directed by Coumadin clinic.  The target INR is 2.0-3.0.  The next INR is due 11/13/2010.  Anticoagulation instructions were given to patient.  Results were reviewed/authorized by Ella Jubilee, PharmD.         Prior Anticoagulation Instructions: Continue current regimen of 1 1/2 tablets (4.5 mg) daily EXCEPT for 2 tablets (6 mg) on Tuesdays, Thursdays, and Saturdays.  INR 2.4  Current Anticoagulation Instructions: Cont with current regimen Return to clinic 3/29 @ 0100 pm

## 2010-10-31 LAB — BASIC METABOLIC PANEL
BUN: 12 mg/dL (ref 6–23)
Chloride: 112 mEq/L (ref 96–112)
Creatinine, Ser: 0.65 mg/dL (ref 0.4–1.2)
GFR calc non Af Amer: >60 mL/min (ref >60)
Glucose, Bld: 90 mg/dL (ref 70–99)
Potassium: 3.4 mEq/L — ABNORMAL LOW (ref 3.5–5.1)

## 2010-10-31 LAB — CBC
HCT: 33.8 % — ABNORMAL LOW (ref 36.0–46.0)
MCHC: 33.7 g/dL (ref 30.0–36.0)
MCV: 87.6 fL (ref 78.0–100.0)
Platelets: 173 10*3/uL (ref 150–400)
RDW: 13.5 % (ref 11.5–15.5)

## 2010-11-04 NOTE — Assessment & Plan Note (Signed)
Summary: 6 WK ROV /NWS   Vital Signs:  Patient profile:   77 year old female Height:      65 inches Weight:      149 pounds BMI:     24.88 Temp:     98.2 degrees F oral Pulse rate:   68 / minute Pulse rhythm:   regular Resp:     16 per minute BP sitting:   118 / 58  (left arm) Cuff size:   regular  Vitals Entered By: Jonathon Resides, Gabrielle Dare) (October 28, 2010 4:16 PM) CC: 6 wk f/u  Is Patient Diabetic? No Comments pt is not taking Atelvia, Metoprolol Succinate, or Hydroco-APAP.  She would like a alt for simvastatin due to leg pain and to know what vitamins she should take   Primary Care Provider:  Cassandria Anger MD  CC:  6 wk f/u .  History of Present Illness: The patient presents for a follow up of hypertension, A fib, hyperlipidemia C/o weakness ? due to meds  Current Medications (verified): 1)  Prilosec 20 Mg Cpdr (Omeprazole) .... Once Daily 2)  Vitamin B-12 Cr 1000 Mcg  Tbcr (Cyanocobalamin) .... Take One Tablet By Mouth Daily 3)  Aspir-Low 81 Mg Tbec (Aspirin) .[name].. 1 By Mouth Once Daily 4)  Simvastatin 40 Mg Tabs (Simvastatin) .... Once Daily 5)  Atelvia 35 Mg Tbec (Risedronate Sodium) .... Hold 6)  Vitamin D3 1000 Unit Caps (Cholecalciferol) .[name].. 1 By Mouth Qd 7)  Metoprolol Succinate 25 Mg Xr24h-Tab (Metoprolol Succinate) .... One By Mouth Daily 8)  Icaps  Caps (Multiple Vitamins-Minerals) .[name].. 1 By Mouth Once Daily 9)  Hydrocodone-Acetaminophen 5-325 Mg Tabs (Hydrocodone-Acetaminophen) .[name].. 1-2 Every 4to 6 Shours As Needed Pain 10)  Furosemide 20 Mg Tabs (Furosemide) .[name].. 1 By Mouth Qam As Needed For Swelling As Needed X2-3 D 11)  Cyclobenzaprine Hcl 10 Mg Tabs (Cyclobenzaprine Hcl) .... 1/2-1 Tab By Mouth Two Times A Day As Needed Muscle Spasms 12)  Flecainide Acetate 50 Mg Tabs (Flecainide Acetate) .[name].. 1 By Mouth Once Daily 13)  Coumadin 3 Mg Tabs (Warfarin Sodium) .... Take As Directed By Coumadin Clinic 14)  Metoprolol Tartrate 25 Mg Tabs (Metoprolol  Tartrate) .[name].. 1 By Mouth Two Times A Day 15)  Naproxen 500 Mg Tabs (Naproxen) .[name].. 1 At Bedtime As Needed 16)  Tylenol Extra Strength 500 Mg Tabs (Acetaminophen) .... As Needed  Allergies (verified): 1)  Augmentin  Past History:  Past Medical History: Last updated: 09/08/2010 Colonic polyps, hx of Diverticulosis, colon GERD Osteoarthritis Osteoporosis Mild TR Vit B12 def Vit D def Breast cancer, hx of Hyperlipidemia Familial tremor GYN DR Cherylann Banas Low back pain Mac degeneration, cataracts A fib Dr Lovena Le Atrial fibrillation  Past Surgical History: Last updated: 08/26/2010 Hysterectomy Breast Augmentation Cataract extraction Total knee replacement R Dec 2012 Dr Noemi Chapel  Family History: Last updated: 11/28/2009 Family History Hypertension, tremor  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 Smoked Drug use-no Regular exercise-no  Review of Systems  The patient denies weight gain, chest pain, syncope, dyspnea on exertion, hemoptysis, abdominal pain, and melena.    Physical Exam  General:  WNWD white female in no acute distress Eyes:  pupils equal and pupils round.  vision grossly intact, pupils equal, and pupils round.   Ears:  R ear normal and L ear normal.   Nose:  External nasal examination shows no deformity or inflammation. Nasal mucosa are pink and moist without lesions or exudates. Mouth:  WNL Lungs:  Normal respiratory effort, chest expands symmetrically. Lungs are clear to auscultation, no crackles or wheezes. Heart:  Normal rate and regular rhythm. S1 and S2 normal without gallop, murmur, click, rub or other extra sounds. Abdomen:  Bowel sounds positive,abdomen soft and non-tender without masses, organomegaly or hernias noted. Msk:  Using a cane R knee is less swollen, tender with a scar Neurologic:  No cranial nerve deficits noted. Station and gait are normal. Plantar reflexes are down-going bilaterally. DTRs are  symmetrical throughout. Sensory, motor and coordinative functions appear intact. Skin:  Intact without suspicious lesions or rashes Psych:  Cognition and judgment appear intact. Alert and cooperative with normal attention span and concentration. No apparent delusions, illusions, hallucinations   Impression & Recommendations:  Problem # 1:  FATIGUE (ICD-780.79) multifactorial Assessment Unchanged  Problem # 2:  COUMADIN THERAPY (ICD-V58.61) Assessment: Unchanged On the regimen of medicine(s) reflected in the chart    Problem # 3:  ATRIAL FIBRILLATION (ICD-427.31) Assessment: Unchanged  The following medications were removed from the medication list:    Metoprolol Succinate 25 Mg Xr24h-tab (Metoprolol succinate) ..... One by mouth daily Her updated medication list for this problem includes:    Aspir-low 81 Mg Tbec (Aspirin) .[name]... 1 by mouth once daily    Flecainide Acetate 50 Mg Tabs (Flecainide acetate) .[name]... 1 by mouth once daily    Coumadin 3 Mg Tabs (Warfarin sodium) .[name]... Take as directed by coumadin clinic    Metoprolol Tartrate 25 Mg Tabs (Metoprolol tartrate) .[name]... 1 by mouth two times a day  Problem # 4:  EDEMA (ICD-782.3) Assessment: Improved  Her updated medication list for this problem includes:    Furosemide 20 Mg Tabs (Furosemide) .[name]... 1 by mouth qam as needed for swelling as needed x2-3 d  Complete Medication List: 1)  Prilosec 20 Mg Cpdr (Omeprazole) .... Once daily 2)  Vitamin B-12 Cr 1000 Mcg Tbcr (Cyanocobalamin) .... Take one tablet by mouth daily 3)  Aspir-low 81 Mg Tbec (Aspirin) .[name].. 1 by mouth once daily 4)  Simvastatin 40 Mg Tabs (Simvastatin) .... Once daily 5)  Vitamin D3 1000 Unit Caps (Cholecalciferol) .[name].. 1 by mouth qd 6)  Icaps Caps (Multiple vitamins-minerals) .[name].. 1 by mouth once daily 7)  Furosemide 20 Mg Tabs (Furosemide) .[name].. 1 by mouth qam as needed for swelling as needed x2-3 d 8)  Flecainide Acetate 50 Mg Tabs (Flecainide acetate) .[name].. 1  by mouth once daily 9)  Coumadin 3 Mg Tabs (Warfarin sodium) .... Take as directed by coumadin clinic 10)  Metoprolol Tartrate 25 Mg Tabs (Metoprolol tartrate) .[name].. 1 by mouth two times a day 11)  Tylenol Extra Strength 500 Mg Tabs (Acetaminophen) .... As needed  Patient Instructions: 1)  Try Metoprolol 1/2 tab in am and 1 at hs - if better - try 1/2 two times a day (to see if fatigue is better) 2)  Please schedule a follow-up appointment in 3 months. 3)  BMP prior to visit, ICD-9: 4)  TSH prior to visit, ICD-9: 5)  CBC w/ Diff prior to visit, ICD-9: 6)  Vit B12 266.20 780.79   Orders Added: 1)  Est. Patient Level IV [44818]

## 2010-11-11 ENCOUNTER — Telehealth: Payer: Self-pay | Admitting: Internal Medicine

## 2010-11-11 NOTE — Telephone Encounter (Signed)
Pt calls requesting post hosp/post TIA. Needs this week, no slots,please advise.

## 2010-11-12 ENCOUNTER — Telehealth: Payer: Self-pay | Admitting: Internal Medicine

## 2010-11-12 NOTE — Telephone Encounter (Signed)
Pt states that she called yesterday about getting a Hospital F/U OV. Pt states now that she was told she had a "possible" TIA. She had a brain scan that showed a Meningioma in the lining of the brain that was thought to be benign but they wanted her to f/u with PCP in (3) days to have a MRI scheduled or a referral to a Neurologist. Pt is very anxious and states that she waited all day yesterday for a response. Please advise.

## 2010-11-12 NOTE — Telephone Encounter (Signed)
Per written VO from Dr Alain Marion, Pt has been added to schedule tomorrow 11/13/10 at 10:30am Pt informed and is extremely grateful.!

## 2010-11-12 NOTE — Telephone Encounter (Signed)
We can try next wk

## 2010-11-13 ENCOUNTER — Ambulatory Visit (INDEPENDENT_AMBULATORY_CARE_PROVIDER_SITE_OTHER): Payer: Medicare Other | Admitting: Internal Medicine

## 2010-11-13 ENCOUNTER — Encounter: Payer: Self-pay | Admitting: Internal Medicine

## 2010-11-13 ENCOUNTER — Telehealth: Payer: Self-pay | Admitting: Internal Medicine

## 2010-11-13 ENCOUNTER — Ambulatory Visit (INDEPENDENT_AMBULATORY_CARE_PROVIDER_SITE_OTHER): Payer: Medicare Other | Admitting: *Deleted

## 2010-11-13 DIAGNOSIS — F411 Generalized anxiety disorder: Secondary | ICD-10-CM

## 2010-11-13 DIAGNOSIS — I4891 Unspecified atrial fibrillation: Secondary | ICD-10-CM

## 2010-11-13 DIAGNOSIS — R930 Abnormal findings on diagnostic imaging of skull and head, not elsewhere classified: Secondary | ICD-10-CM

## 2010-11-13 DIAGNOSIS — G459 Transient cerebral ischemic attack, unspecified: Secondary | ICD-10-CM

## 2010-11-13 DIAGNOSIS — Z7901 Long term (current) use of anticoagulants: Secondary | ICD-10-CM

## 2010-11-13 DIAGNOSIS — R9089 Other abnormal findings on diagnostic imaging of central nervous system: Secondary | ICD-10-CM

## 2010-11-13 DIAGNOSIS — Z8673 Personal history of transient ischemic attack (TIA), and cerebral infarction without residual deficits: Secondary | ICD-10-CM | POA: Insufficient documentation

## 2010-11-13 DIAGNOSIS — F419 Anxiety disorder, unspecified: Secondary | ICD-10-CM

## 2010-11-13 DIAGNOSIS — E538 Deficiency of other specified B group vitamins: Secondary | ICD-10-CM

## 2010-11-13 MED ORDER — ALPRAZOLAM 0.25 MG PO TABS: 0.2500 mg | ORAL_TABLET | Freq: Two times a day (BID) | ORAL | Status: DC | PRN

## 2010-11-13 MED ORDER — FLECAINIDE ACETATE 50 MG PO TABS: 50.0000 mg | ORAL_TABLET | Freq: Two times a day (BID) | ORAL | Status: DC

## 2010-11-13 NOTE — Assessment & Plan Note (Signed)
No change. On coumadin. INR at the Hospital was 1.9

## 2010-11-13 NOTE — Assessment & Plan Note (Signed)
Poss meningioma. Will order an MRI

## 2010-11-13 NOTE — Progress Notes (Signed)
  Subjective:    Patient ID: Alyssa Luna, female    DOB: March 10, 1934, 77 y.o.   MRN: [id number]  HPI  The pt is here for a post-hospital stay in [facility] for TIA symptoms. The sx's were lasting for 5-10 min and resolved completely. There was no facial droop, weak arm, leg etc. No LOC She was found to have a possible meningioma on CT scan   Review of Systems  Constitutional: Positive for fatigue. Negative for activity change.  HENT: Negative for sneezing and neck stiffness.   Eyes: Negative for pain.  Respiratory: Negative for chest tightness and wheezing.   Cardiovascular: Negative for chest pain.  Gastrointestinal: Negative for abdominal distention.  Genitourinary: Negative for decreased urine volume and difficulty urinating.  Musculoskeletal: Positive for joint swelling (pos-op knee). Negative for back pain.  Neurological: Negative for dizziness, tremors, seizures, syncope, facial asymmetry, speech difficulty, weakness, light-headedness, numbness and headaches.  Hematological: Does not bruise/bleed easily.  Psychiatric/Behavioral: Negative for suicidal ideas, dysphoric mood and agitation. The patient is nervous/anxious.        Objective:   Physical Exam  Constitutional: She is oriented to person, place, and time. She appears well-developed and well-nourished. No distress.  HENT:  Head: Normocephalic.  Right Ear: External ear normal.  Left Ear: External ear normal.  Nose: Nose normal.  Mouth/Throat: Oropharynx is clear and moist.  Eyes: Conjunctivae are normal. Pupils are equal, round, and reactive to light. Right eye exhibits no discharge. Left eye exhibits no discharge.  Neck: Normal range of motion. Neck supple. No JVD present. No tracheal deviation present. No thyromegaly present.  Cardiovascular: Normal rate, regular rhythm and normal heart sounds.   Pulmonary/Chest: No stridor. No respiratory distress. She has no wheezes.  Abdominal: Soft. Bowel sounds are normal.  She exhibits no distension and no mass. There is no tenderness. There is no rebound and no guarding.  Musculoskeletal: She exhibits tenderness (knee). She exhibits no edema.  Lymphadenopathy:    She has no cervical adenopathy.  Neurological: She is alert and oriented to person, place, and time. She displays normal reflexes. No cranial nerve deficit. She exhibits normal muscle tone. Coordination normal.  Skin: No rash noted. No erythema.  Psychiatric: She has a normal mood and affect. Her behavior is normal. Judgment and thought content normal.       Lab Results  Component Value Date   WBC 5.0 09/03/2010   HGB 11.5* 09/03/2010   HCT 35.8* 09/03/2010   PLT 227 09/03/2010   CHOL 198 10/21/2010   TRIG 259.0* 10/21/2010   HDL 32.20* 10/21/2010   LDLDIRECT 120.9 10/21/2010   ALT 16 10/21/2010   AST 22 10/21/2010   NA 141 10/21/2010   K 4.2 10/21/2010   CL 106 10/21/2010   CREATININE 0.6 10/21/2010   BUN 15 10/21/2010   CO2 30 10/21/2010   TSH 1.33 08/26/2010   INR 2.6 11/13/2010       Assessment & Plan:   ATRIAL FIBRILLATION No change. On coumadin. INR at the Hospital was 1.9  Abnormal CT of brain Poss meningioma. We had a discussion of current findings. Will order an MRI  TIA (transient ischemic attack) vs other (?migraine) All symptoms have resolved  VITAMIN B12 DEFICIENCY On Rx        We obtained records from [facility] and discussed them in detail. The visit took>45 min

## 2010-11-13 NOTE — Telephone Encounter (Signed)
Pt is taking the medication one tablet by mouth twice daily

## 2010-11-13 NOTE — Assessment & Plan Note (Addendum)
On Rx 

## 2010-11-13 NOTE — Assessment & Plan Note (Addendum)
Resole

## 2010-11-24 ENCOUNTER — Ambulatory Visit
Admission: RE | Admit: 2010-11-24 | Discharge: 2010-11-24 | Disposition: A | Discharge status: Home / Self Care | Payer: Medicare Other | Source: Ambulatory Visit | Attending: Internal Medicine | Admitting: Internal Medicine

## 2010-11-24 DIAGNOSIS — R9089 Other abnormal findings on diagnostic imaging of central nervous system: Secondary | ICD-10-CM

## 2010-11-24 DIAGNOSIS — G459 Transient cerebral ischemic attack, unspecified: Secondary | ICD-10-CM

## 2010-11-24 MED ORDER — GADOBENATE DIMEGLUMINE 529 MG/ML IV SOLN
13.0000 mL | Freq: Once | INTRAVENOUS | Status: AC | PRN
  Administered 2010-11-24: 13 mL via INTRAVENOUS

## 2010-11-26 ENCOUNTER — Telehealth: Payer: Self-pay | Admitting: Internal Medicine

## 2010-11-26 NOTE — Telephone Encounter (Signed)
Erline Levine , please, inform the patient:  The brain MRI is normal, no tumor  Please, keep  next office visit appointment.   Thank you !

## 2010-11-28 ENCOUNTER — Encounter: Payer: Self-pay | Admitting: Internal Medicine

## 2010-11-28 ENCOUNTER — Ambulatory Visit (INDEPENDENT_AMBULATORY_CARE_PROVIDER_SITE_OTHER): Payer: Medicare Other | Admitting: Internal Medicine

## 2010-11-28 DIAGNOSIS — I4891 Unspecified atrial fibrillation: Secondary | ICD-10-CM

## 2010-11-28 DIAGNOSIS — R930 Abnormal findings on diagnostic imaging of skull and head, not elsewhere classified: Secondary | ICD-10-CM

## 2010-11-28 DIAGNOSIS — M79609 Pain in unspecified limb: Secondary | ICD-10-CM

## 2010-11-28 DIAGNOSIS — R9089 Other abnormal findings on diagnostic imaging of central nervous system: Secondary | ICD-10-CM

## 2010-11-28 MED ORDER — TRAMADOL HCL 50 MG PO TABS: 50.0000 mg | ORAL_TABLET | Freq: Two times a day (BID) | ORAL | Status: AC | PRN

## 2010-11-28 NOTE — Progress Notes (Signed)
  Subjective:    Patient ID: Alyssa Luna, female    DOB: [birth year], 77 y.o.   MRN: [id number]  HPI  F/u brain MRI C/o L hip pain worse with walking. R knee is better.  Review of Systems  Constitutional: Negative for activity change and fatigue.  HENT: Negative for ear pain, congestion, rhinorrhea and mouth sores.   Eyes: Negative for pain, itching and visual disturbance.  Respiratory: Negative for cough, shortness of breath and wheezing.   Cardiovascular: Negative for chest pain, palpitations and leg swelling.  Gastrointestinal: Negative for nausea, abdominal pain, diarrhea and constipation.  Genitourinary: Negative for dysuria and urgency.  Musculoskeletal: Negative for back pain.  Skin: Negative for rash.  Neurological: Negative for dizziness and syncope.  Psychiatric/Behavioral: The patient is not nervous/anxious.        Objective:   Physical Exam     Physical Exam  Constitutional: She is oriented to person, place, and time. She appears well-developed and well-nourished. No distress.  HENT:  Head: Normocephalic.  Right Ear: External ear normal.  Left Ear: External ear normal.  Nose: Nose normal.  Mouth/Throat: Oropharynx is clear and moist.  Eyes: Conjunctivae are normal. Pupils are equal, round, and reactive to light. Right eye exhibits no discharge. Left eye exhibits no discharge.  Neck: Normal range of motion. Neck supple. No JVD present. No tracheal deviation present. No thyromegaly present.  Cardiovascular: Normal rate, regular rhythm and normal heart sounds.   Pulmonary/Chest: No stridor. No respiratory distress. She has no wheezes.  Abdominal: Soft. Bowel sounds are normal. She exhibits no distension and no mass. There is no tenderness. There is no rebound and no guarding.  Musculoskeletal: She exhibits less tenderness (knee). She exhibits no edema.  Lymphadenopathy: no   Assessment & Plan:     Abnormal CT of brain Brain MRI was reviewed with pt. All  questions were answered  ATRIAL FIBRILLATION On Rx  LEG PAIN R knee pain is better. L hip pain - troch bursitis. Options to treat discussed.

## 2010-11-28 NOTE — Telephone Encounter (Signed)
Pt has ov today

## 2010-11-28 NOTE — Patient Instructions (Signed)
Take Naproxen x 2 wks

## 2010-11-30 ENCOUNTER — Encounter: Payer: Self-pay | Admitting: Internal Medicine

## 2010-12-02 NOTE — Assessment & Plan Note (Signed)
Brain MRI was reviewed with pt

## 2010-12-02 NOTE — Assessment & Plan Note (Signed)
On Rx 

## 2010-12-02 NOTE — Assessment & Plan Note (Signed)
R knee pain is better. L hip pain - troch bursitis. Options to traet discussed.

## 2010-12-08 ENCOUNTER — Encounter: Payer: Self-pay | Admitting: *Deleted

## 2010-12-11 ENCOUNTER — Ambulatory Visit (INDEPENDENT_AMBULATORY_CARE_PROVIDER_SITE_OTHER): Payer: Medicare Other | Admitting: *Deleted

## 2010-12-11 DIAGNOSIS — I4891 Unspecified atrial fibrillation: Secondary | ICD-10-CM

## 2010-12-11 LAB — POCT INR: INR: 1.8

## 2010-12-15 ENCOUNTER — Ambulatory Visit: Payer: Self-pay | Admitting: Internal Medicine

## 2010-12-29 ENCOUNTER — Other Ambulatory Visit: Payer: Self-pay | Admitting: Internal Medicine

## 2010-12-29 ENCOUNTER — Ambulatory Visit: Payer: Medicare Other | Admitting: Internal Medicine

## 2011-01-01 ENCOUNTER — Ambulatory Visit (INDEPENDENT_AMBULATORY_CARE_PROVIDER_SITE_OTHER): Payer: Medicare Other | Admitting: *Deleted

## 2011-01-01 DIAGNOSIS — I4891 Unspecified atrial fibrillation: Secondary | ICD-10-CM

## 2011-01-01 LAB — POCT INR: INR: 1.7

## 2011-01-02 NOTE — Op Note (Signed)
[place]  Patient:    Alyssa Luna, Alyssa Luna                    MRN: [id number] Proc. Date: 12/04/99 Adm. Date:  [id number] Attending:  Marlon Pel                           Operative Report  NO DICTATION. DD:  12/04/99 TD:  12/04/99 Job: [id number]

## 2011-01-15 ENCOUNTER — Ambulatory Visit (INDEPENDENT_AMBULATORY_CARE_PROVIDER_SITE_OTHER): Payer: Medicare Other | Admitting: *Deleted

## 2011-01-15 DIAGNOSIS — I4891 Unspecified atrial fibrillation: Secondary | ICD-10-CM

## 2011-01-20 ENCOUNTER — Other Ambulatory Visit (INDEPENDENT_AMBULATORY_CARE_PROVIDER_SITE_OTHER): Payer: Medicare Other

## 2011-01-20 DIAGNOSIS — R5383 Other fatigue: Secondary | ICD-10-CM

## 2011-01-20 DIAGNOSIS — R5381 Other malaise: Secondary | ICD-10-CM

## 2011-01-20 DIAGNOSIS — E538 Deficiency of other specified B group vitamins: Secondary | ICD-10-CM

## 2011-01-20 LAB — CBC WITH DIFFERENTIAL/PLATELET
Eosinophils Relative: 9.6 % — ABNORMAL HIGH (ref 0.0–5.0)
HCT: 38.2 % (ref 36.0–46.0)
Hemoglobin: 13.1 g/dL (ref 12.0–15.0)
Lymphs Abs: 2 10*3/uL (ref 0.7–4.0)
Monocytes Relative: 9.8 % (ref 3.0–12.0)
Neutro Abs: 1.4 10*3/uL (ref 1.4–7.7)
RBC: 4.43 Mil/uL (ref 3.87–5.11)
WBC: 4.3 10*3/uL — ABNORMAL LOW (ref 4.5–10.5)

## 2011-01-20 LAB — BASIC METABOLIC PANEL
GFR: 85.21 mL/min (ref >60.00)
Potassium: 4.9 mEq/L (ref 3.5–5.1)
Sodium: 139 mEq/L (ref 135–145)

## 2011-02-03 ENCOUNTER — Encounter: Payer: Self-pay | Admitting: Internal Medicine

## 2011-02-03 ENCOUNTER — Ambulatory Visit (INDEPENDENT_AMBULATORY_CARE_PROVIDER_SITE_OTHER): Payer: Medicare Other | Admitting: Internal Medicine

## 2011-02-03 DIAGNOSIS — G459 Transient cerebral ischemic attack, unspecified: Secondary | ICD-10-CM

## 2011-02-03 DIAGNOSIS — M545 Low back pain, unspecified: Secondary | ICD-10-CM

## 2011-02-03 DIAGNOSIS — E785 Hyperlipidemia, unspecified: Secondary | ICD-10-CM

## 2011-02-03 DIAGNOSIS — E538 Deficiency of other specified B group vitamins: Secondary | ICD-10-CM

## 2011-02-03 DIAGNOSIS — R5383 Other fatigue: Secondary | ICD-10-CM

## 2011-02-03 DIAGNOSIS — D320 Benign neoplasm of cerebral meninges: Secondary | ICD-10-CM

## 2011-02-03 DIAGNOSIS — I4891 Unspecified atrial fibrillation: Secondary | ICD-10-CM

## 2011-02-03 DIAGNOSIS — R252 Cramp and spasm: Secondary | ICD-10-CM

## 2011-02-03 DIAGNOSIS — D329 Benign neoplasm of meninges, unspecified: Secondary | ICD-10-CM

## 2011-02-03 DIAGNOSIS — R5381 Other malaise: Secondary | ICD-10-CM

## 2011-02-03 NOTE — Progress Notes (Signed)
  Subjective:    Patient ID: Alyssa Luna, female    DOB: 1934/08/15, 77 y.o.   MRN: [id number]  HPI    Review of Systems Subjective:    Patient ID: Alyssa Luna, female    DOB: 1934/06/30, 77 y.o.   MRN: [id number]  HPI  The patient presents for a follow-up of  chronic hypertension, chronic dyslipidemia, meningioma . LE cramps have resolved. Knee is better R.  Review of Systems  Constitutional: Positive for fatigue. Negative for activity change.  HENT: Negative for sneezing and neck stiffness.   Eyes: Negative for pain.  Respiratory: Negative for chest tightness and wheezing.   Cardiovascular: Negative for chest pain.  Gastrointestinal: Negative for abdominal distention.  Genitourinary: Negative for decreased urine volume and difficulty urinating.  Musculoskeletal: Positive for joint swelling (pos-op knee). Negative for back pain.  Neurological: Negative for dizziness, tremors, seizures, syncope, facial asymmetry, speech difficulty, weakness, light-headedness, numbness and headaches.  Hematological: Does not bruise/bleed easily.  Psychiatric/Behavioral: Negative for suicidal ideas, dysphoric mood and agitation. The patient is nervous/anxious.        Objective:   Physical Exam  Constitutional: She is oriented to person, place, and time. She appears well-developed and well-nourished. No distress.  HENT:  Head: Normocephalic.  Right Ear: External ear normal.  Left Ear: External ear normal.  Nose: Nose normal.  Mouth/Throat: Oropharynx is clear and moist.  Eyes: Conjunctivae are normal. Pupils are equal, round, and reactive to light. Right eye exhibits no discharge. Left eye exhibits no discharge.  Neck: Normal range of motion. Neck supple. No JVD present. No tracheal deviation present. No thyromegaly present.  Cardiovascular: Normal rate, regular rhythm and normal heart sounds.   Pulmonary/Chest: No stridor. No respiratory distress. She has no wheezes.  Abdominal:  Soft. Bowel sounds are normal. She exhibits no distension and no mass. There is no tenderness. There is no rebound and no guarding.  Musculoskeletal: She exhibits tenderness (knee). She exhibits no edema.  Lymphadenopathy:    She has no cervical adenopathy.  Neurological: She is alert and oriented to person, place, and time. She displays normal reflexes. No cranial nerve deficit. She exhibits normal muscle tone. Coordination normal.  Skin: No rash noted. No erythema.  Psychiatric: She has a normal mood and affect. Her behavior is normal. Judgment and thought content normal.      Lab Results  Component Value Date   WBC 4.3* 01/20/2011   HGB 13.1 01/20/2011   HCT 38.2 01/20/2011   PLT 241.0 01/20/2011   CHOL 198 10/21/2010   TRIG 259.0* 10/21/2010   HDL 32.20* 10/21/2010   LDLDIRECT 120.9 10/21/2010   ALT 16 10/21/2010   AST 22 10/21/2010   NA 139 01/20/2011   K 4.9 01/20/2011   CL 106 01/20/2011   CREATININE 0.7 01/20/2011   BUN 18 01/20/2011   CO2 30 01/20/2011   TSH 2.64 01/20/2011   INR 2.2 01/15/2011     MRI brain w/small meningioma   Assessment & Plan:       Objective:   Physical Exam        Assessment & Plan:

## 2011-02-03 NOTE — Assessment & Plan Note (Signed)
Resolved

## 2011-02-03 NOTE — Assessment & Plan Note (Signed)
On Rx 

## 2011-02-03 NOTE — Assessment & Plan Note (Signed)
Rate controlled 

## 2011-02-03 NOTE — Assessment & Plan Note (Addendum)
Good diet. She wants to re-try Zocor. D/c if cramps

## 2011-02-10 ENCOUNTER — Ambulatory Visit (INDEPENDENT_AMBULATORY_CARE_PROVIDER_SITE_OTHER): Payer: Medicare Other | Admitting: *Deleted

## 2011-02-10 DIAGNOSIS — I4891 Unspecified atrial fibrillation: Secondary | ICD-10-CM

## 2011-03-03 ENCOUNTER — Encounter: Payer: Self-pay | Admitting: Internal Medicine

## 2011-03-03 ENCOUNTER — Ambulatory Visit (INDEPENDENT_AMBULATORY_CARE_PROVIDER_SITE_OTHER): Payer: Medicare Other | Admitting: *Deleted

## 2011-03-03 DIAGNOSIS — I4891 Unspecified atrial fibrillation: Secondary | ICD-10-CM

## 2011-03-03 LAB — POCT INR: INR: 2.4

## 2011-03-31 ENCOUNTER — Encounter: Payer: Self-pay | Admitting: Internal Medicine

## 2011-03-31 ENCOUNTER — Ambulatory Visit (INDEPENDENT_AMBULATORY_CARE_PROVIDER_SITE_OTHER): Payer: Medicare Other | Admitting: Internal Medicine

## 2011-03-31 ENCOUNTER — Ambulatory Visit (INDEPENDENT_AMBULATORY_CARE_PROVIDER_SITE_OTHER): Payer: Medicare Other | Admitting: *Deleted

## 2011-03-31 VITALS — BP 117/69 | HR 49 | Resp 12 | Ht 65.0 in | Wt 153.0 lb

## 2011-03-31 DIAGNOSIS — I4891 Unspecified atrial fibrillation: Secondary | ICD-10-CM

## 2011-03-31 DIAGNOSIS — E785 Hyperlipidemia, unspecified: Secondary | ICD-10-CM

## 2011-03-31 LAB — POCT INR: INR: 1.8

## 2011-03-31 NOTE — Progress Notes (Signed)
HPI Alyssa Luna returns today for followup. She is a 77 year old woman with a history of atrial fibrillation, hypertension, and dyslipidemia. She has been maintained on warfarin for thromboembolic prevention. She has minimal palpitations on antiarrhythmic drug therapy. Allergies  Allergen Reactions  . SPQ:[id number]+[id number]+[id number] Acid+Aspartame     REACTION: diarrhea  . Simvastatin     REACTION: leg cramps     Current Outpatient Prescriptions  Medication Sig Dispense Refill  . acetaminophen (TYLENOL) 500 MG tablet Take 500 mg by mouth as needed.        [name] aspirin 81 MG EC tablet Take 81 mg by mouth daily.        . Cholecalciferol 1000 UNITS tablet Take 1,000 Units by mouth daily.        . Cyanocobalamin (VITAMIN B-12 CR) 1000 MCG TBCR Take by mouth daily.        . flecainide (TAMBOCOR) 50 MG tablet Take 1 tablet (50 mg total) by mouth 2 (two) times daily.      . furosemide (LASIX) 20 MG tablet as needed.       . metoprolol tartrate (LOPRESSOR) 25 MG tablet Take 25 mg by mouth 2 (two) times daily.        . naproxen (NAPROSYN) 500 MG tablet Take 500 mg by mouth as needed.       [name] omeprazole (PRILOSEC) 20 MG capsule Take 20 mg by mouth daily.        . simvastatin (ZOCOR) 40 MG tablet Take 40 mg by mouth at bedtime.        [name] warfarin (COUMADIN) 3 MG tablet TAKE AS DIRECTED BY COUMADIN CLINIC  180 tablet  1  . ALPRAZolam (XANAX) 0.25 MG tablet Take 1 tablet (0.25 mg total) by mouth 2 (two) times daily as needed for anxiety.  60 tablet  1     Past Medical History  Diagnosis Date  . Colon polyps   . Diverticulosis of colon   . GERD (gastroesophageal reflux disease)   . Osteoarthritis   . Osteoporosis   . TR (tricuspid regurgitation)     Mild  . Vitamin B12 deficiency   . Vitamin D deficiency   . History of breast cancer   . Hyperlipidemia   . Familial tremor   . LBP (low back pain)   . Cataract   . Macular degeneration   . Atrial fibrillation     D Taylor  . TIA  (transient ischemic attack)     ROS:   All systems reviewed and negative except as noted in the HPI.   Past Surgical History  Procedure Date  . Abdominal hysterectomy   . Breast enhancement surgery   . Cataract extraction   . Total knee arthroplasty Dec 2011    Right - Dr Noemi Chapel     Family History  Problem Relation Age of Onset  . Tremor Other   . Hypertension Other   . Early death Neg Hx   . Stroke Neg Hx      History   Social History  . Marital Status: Married    Spouse Name: N/A    Number of Children: N/A  . Years of Education: N/A   Occupational History  . [employer], Retired    Social History Main Topics  . Smoking status: Never Smoker   . Smokeless tobacco: Not on file  . Alcohol Use: No  . Drug Use: No  . Sexually Active: Not on file   Other Topics Concern  . Not on  file   Social History Narrative   Regular Exercise -  NO     BP 117/69  Pulse 49  Resp 12  Ht 5' 5"  (1.651 m)  Wt 153 lb (69.4 kg)  BMI 25.46 kg/m2  Physical Exam:  Well appearing 77 year old woman, NAD HEENT: Unremarkable Neck:  No JVD, no thyromegally Lymphatics:  No adenopathy Back:  No CVA tenderness Lungs:  Clear with no wheezes, rales, or rhonchi. HEART:  Regular rate rhythm, no murmurs, no rubs, no clicks Abd:  soft, positive bowel sounds, no organomegally, no rebound, no guarding Ext:  2 plus pulses, no edema, no cyanosis, no clubbing Skin:  No rashes no nodules Neuro:  CN II through XII intact, motor grossly intact   Assess/Plan:

## 2011-03-31 NOTE — Assessment & Plan Note (Signed)
She will continue her current medical therapy and maintain a low-fat diet. Regular exercise has been recommended.

## 2011-03-31 NOTE — Assessment & Plan Note (Signed)
Her symptoms are well controlled. She will continue her current medical therapy.

## 2011-03-31 NOTE — Patient Instructions (Signed)
Your physician wants you to follow-up in: 6 months with Dr Taylor You will receive a reminder letter in the mail two months in advance. If you don't receive a letter, please call our office to schedule the follow-up appointment.  

## 2011-04-10 ENCOUNTER — Other Ambulatory Visit: Payer: Self-pay | Admitting: Internal Medicine

## 2011-04-21 ENCOUNTER — Ambulatory Visit (INDEPENDENT_AMBULATORY_CARE_PROVIDER_SITE_OTHER): Payer: Medicare Other | Admitting: *Deleted

## 2011-04-21 DIAGNOSIS — I4891 Unspecified atrial fibrillation: Secondary | ICD-10-CM

## 2011-05-06 ENCOUNTER — Ambulatory Visit (INDEPENDENT_AMBULATORY_CARE_PROVIDER_SITE_OTHER): Payer: Medicare Other | Admitting: *Deleted

## 2011-05-06 DIAGNOSIS — I4891 Unspecified atrial fibrillation: Secondary | ICD-10-CM

## 2011-05-06 LAB — POCT INR: INR: 2.6

## 2011-05-12 ENCOUNTER — Encounter: Payer: Self-pay | Admitting: Internal Medicine

## 2011-05-12 ENCOUNTER — Ambulatory Visit (INDEPENDENT_AMBULATORY_CARE_PROVIDER_SITE_OTHER): Payer: Medicare Other | Admitting: Internal Medicine

## 2011-05-12 VITALS — BP 102/60 | HR 76 | Temp 98.2°F | Resp 16 | Wt 153.0 lb

## 2011-05-12 DIAGNOSIS — R259 Unspecified abnormal involuntary movements: Secondary | ICD-10-CM

## 2011-05-12 DIAGNOSIS — E785 Hyperlipidemia, unspecified: Secondary | ICD-10-CM

## 2011-05-12 DIAGNOSIS — M545 Low back pain, unspecified: Secondary | ICD-10-CM

## 2011-05-12 DIAGNOSIS — E538 Deficiency of other specified B group vitamins: Secondary | ICD-10-CM

## 2011-05-12 DIAGNOSIS — K219 Gastro-esophageal reflux disease without esophagitis: Secondary | ICD-10-CM

## 2011-05-12 DIAGNOSIS — E559 Vitamin D deficiency, unspecified: Secondary | ICD-10-CM

## 2011-05-12 DIAGNOSIS — R251 Tremor, unspecified: Secondary | ICD-10-CM

## 2011-05-12 NOTE — Assessment & Plan Note (Signed)
Continue with current prescription therapy as reflected on the Med list.  

## 2011-05-12 NOTE — Assessment & Plan Note (Signed)
Neurol cons Dr Erling Cruz

## 2011-05-12 NOTE — Progress Notes (Signed)
Subjective:    Patient ID: Alyssa Luna, female    DOB: 04/19/1934, 77 y.o.   MRN: [id number]  HPI The patient presents for a follow-up of  chronic OA, chronic dyslipidemia, vit B12 and D def controlled with medicines. C/o tremor in hands x years - worse lately  Current Outpatient Prescriptions on File Prior to Visit  Medication Sig Dispense Refill  . acetaminophen (TYLENOL) 500 MG tablet Take 500 mg by mouth as needed.        [name] aspirin 81 MG EC tablet Take 81 mg by mouth daily.        . Cholecalciferol 1000 UNITS tablet Take 1,000 Units by mouth daily.        . Cyanocobalamin (VITAMIN B-12 CR) 1000 MCG TBCR Take by mouth daily.        . flecainide (TAMBOCOR) 50 MG tablet Take 1 tablet (50 mg total) by mouth 2 (two) times daily.      . furosemide (LASIX) 20 MG tablet as needed.       . metoprolol tartrate (LOPRESSOR) 25 MG tablet Take 25 mg by mouth 2 (two) times daily.        . naproxen (NAPROSYN) 500 MG tablet TAKE 1 TABLET AT BEDTIME  90 tablet  0  . omeprazole (PRILOSEC) 20 MG capsule Take 20 mg by mouth daily.        . simvastatin (ZOCOR) 40 MG tablet Take 20 mg by mouth at bedtime.       [name] warfarin (COUMADIN) 3 MG tablet TAKE AS DIRECTED BY COUMADIN CLINIC  180 tablet  1  . DISCONTD: ALPRAZolam (XANAX) 0.25 MG tablet Take 1 tablet (0.25 mg total) by mouth 2 (two) times daily as needed for anxiety.  60 tablet  1   BP 102/60  Pulse 76  Temp(Src) 98.2 F (36.8 C) (Oral)  Resp 16  Wt 153 lb (69.4 kg)  Past Medical History  Diagnosis Date  . Colon polyps   . Diverticulosis of colon   . GERD (gastroesophageal reflux disease)   . Osteoarthritis   . Osteoporosis   . TR (tricuspid regurgitation)     Mild  . Vitamin B12 deficiency   . Vitamin D deficiency   . History of breast cancer   . Hyperlipidemia   . Familial tremor   . LBP (low back pain)   . Cataract   . Macular degeneration   . Atrial fibrillation     D Taylor  . TIA (transient ischemic attack)    Past  Surgical History  Procedure Date  . Abdominal hysterectomy   . Breast enhancement surgery   . Cataract extraction   . Total knee arthroplasty Dec 2011    Right - Dr Noemi Chapel    reports that she has never smoked. She does not have any smokeless tobacco history on file. She reports that she does not drink alcohol or use illicit drugs. family history includes Hypertension in her other; Parkinsonism in her brother and mother; and Tremor in her other.  There is no history of Early death and Stroke. Allergies  Allergen Reactions  . PFX:[id number]+DJMEQASTM+[id number] Acid+Aspartame     REACTION: diarrhea  . Simvastatin     REACTION: leg cramps     Review of Systems  Constitutional: Negative for chills, activity change, appetite change, fatigue and unexpected weight change.  HENT: Negative for congestion, mouth sores and sinus pressure.   Eyes: Negative for visual disturbance.  Respiratory: Negative for cough and chest tightness.  Gastrointestinal: Negative for nausea and abdominal pain.  Genitourinary: Negative for frequency, difficulty urinating and vaginal pain.  Musculoskeletal: Negative for back pain and gait problem.  Skin: Negative for pallor and rash.  Neurological: Positive for tremors (B hands tremor). Negative for dizziness, weakness, numbness and headaches.  Psychiatric/Behavioral: Negative for confusion and sleep disturbance. The patient is not nervous/anxious.        Objective:   Physical Exam  Constitutional: She is oriented to person, place, and time. She appears well-developed and well-nourished. No distress.  HENT:  Head: Normocephalic.  Right Ear: External ear normal.  Left Ear: External ear normal.  Nose: Nose normal.  Mouth/Throat: Oropharynx is clear and moist.  Eyes: Conjunctivae are normal. Pupils are equal, round, and reactive to light. Right eye exhibits no discharge. Left eye exhibits no discharge.  Neck: Normal range of motion. Neck supple. No JVD  present. No tracheal deviation present. No thyromegaly present.  Cardiovascular: Normal rate, regular rhythm and normal heart sounds.   Pulmonary/Chest: No stridor. No respiratory distress. She has no wheezes.  Abdominal: Soft. Bowel sounds are normal. She exhibits no distension and no mass. There is no tenderness. There is no rebound and no guarding.  Musculoskeletal: She exhibits no edema and no tenderness.  Lymphadenopathy:    She has no cervical adenopathy.  Neurological: She is alert and oriented to person, place, and time. She displays normal reflexes. No cranial nerve deficit. She exhibits normal muscle tone. Coordination (mild) abnormal.       Hand tremor  Skin: No rash noted. No erythema.  Psychiatric: She has a normal mood and affect. Her behavior is normal. Judgment and thought content normal.          Assessment & Plan:

## 2011-05-22 ENCOUNTER — Ambulatory Visit (INDEPENDENT_AMBULATORY_CARE_PROVIDER_SITE_OTHER): Payer: Medicare Other | Admitting: *Deleted

## 2011-05-22 DIAGNOSIS — I4891 Unspecified atrial fibrillation: Secondary | ICD-10-CM

## 2011-06-09 ENCOUNTER — Ambulatory Visit (INDEPENDENT_AMBULATORY_CARE_PROVIDER_SITE_OTHER): Payer: Medicare Other | Admitting: *Deleted

## 2011-06-09 DIAGNOSIS — I4891 Unspecified atrial fibrillation: Secondary | ICD-10-CM

## 2011-06-09 LAB — POCT INR: INR: 1.8

## 2011-06-25 ENCOUNTER — Other Ambulatory Visit: Payer: Self-pay | Admitting: Dermatology

## 2011-06-29 ENCOUNTER — Ambulatory Visit (INDEPENDENT_AMBULATORY_CARE_PROVIDER_SITE_OTHER): Payer: Medicare Other | Admitting: *Deleted

## 2011-06-29 DIAGNOSIS — I4891 Unspecified atrial fibrillation: Secondary | ICD-10-CM

## 2011-07-10 ENCOUNTER — Other Ambulatory Visit: Payer: Self-pay | Admitting: Cardiology

## 2011-07-21 ENCOUNTER — Ambulatory Visit (INDEPENDENT_AMBULATORY_CARE_PROVIDER_SITE_OTHER): Payer: Medicare Other | Admitting: *Deleted

## 2011-07-21 DIAGNOSIS — I4891 Unspecified atrial fibrillation: Secondary | ICD-10-CM

## 2011-08-07 ENCOUNTER — Ambulatory Visit (INDEPENDENT_AMBULATORY_CARE_PROVIDER_SITE_OTHER): Payer: Medicare Other | Admitting: *Deleted

## 2011-08-07 DIAGNOSIS — I4891 Unspecified atrial fibrillation: Secondary | ICD-10-CM

## 2011-08-24 ENCOUNTER — Ambulatory Visit (INDEPENDENT_AMBULATORY_CARE_PROVIDER_SITE_OTHER): Payer: Medicare Other | Admitting: *Deleted

## 2011-08-24 DIAGNOSIS — I4891 Unspecified atrial fibrillation: Secondary | ICD-10-CM

## 2011-08-24 LAB — POCT INR: INR: 2.7

## 2011-08-25 ENCOUNTER — Ambulatory Visit: Payer: Medicare Other | Admitting: Internal Medicine

## 2011-08-31 ENCOUNTER — Encounter: Payer: Self-pay | Admitting: Internal Medicine

## 2011-09-15 ENCOUNTER — Ambulatory Visit (INDEPENDENT_AMBULATORY_CARE_PROVIDER_SITE_OTHER): Payer: Medicare Other | Admitting: Pharmacist

## 2011-09-15 ENCOUNTER — Other Ambulatory Visit: Payer: Self-pay | Admitting: Internal Medicine

## 2011-09-15 DIAGNOSIS — I4891 Unspecified atrial fibrillation: Secondary | ICD-10-CM

## 2011-09-23 ENCOUNTER — Encounter: Payer: Self-pay | Admitting: Internal Medicine

## 2011-09-23 ENCOUNTER — Ambulatory Visit (INDEPENDENT_AMBULATORY_CARE_PROVIDER_SITE_OTHER): Payer: Medicare Other | Admitting: Internal Medicine

## 2011-09-23 VITALS — BP 120/60 | HR 64 | Temp 97.3°F | Resp 16 | Wt 160.0 lb

## 2011-09-23 DIAGNOSIS — E559 Vitamin D deficiency, unspecified: Secondary | ICD-10-CM

## 2011-09-23 DIAGNOSIS — M1990 Unspecified osteoarthritis, unspecified site: Secondary | ICD-10-CM

## 2011-09-23 DIAGNOSIS — E785 Hyperlipidemia, unspecified: Secondary | ICD-10-CM

## 2011-09-23 DIAGNOSIS — I4891 Unspecified atrial fibrillation: Secondary | ICD-10-CM

## 2011-09-23 DIAGNOSIS — R5381 Other malaise: Secondary | ICD-10-CM

## 2011-09-23 DIAGNOSIS — M545 Low back pain: Secondary | ICD-10-CM

## 2011-09-23 DIAGNOSIS — E538 Deficiency of other specified B group vitamins: Secondary | ICD-10-CM

## 2011-09-23 MED ORDER — MELOXICAM 7.5 MG PO TABS: 7.5000 mg | ORAL_TABLET | Freq: Every day | ORAL | Status: DC

## 2011-09-23 NOTE — Assessment & Plan Note (Signed)
Pain meds prn

## 2011-09-23 NOTE — Assessment & Plan Note (Signed)
Chronic 2012 - off zocor due to LE weakness and pains

## 2011-09-23 NOTE — Assessment & Plan Note (Signed)
Continue with current prescription therapy as reflected on the Med list.  

## 2011-09-23 NOTE — Progress Notes (Signed)
  Subjective:    Patient ID: Alyssa Luna, female    DOB: 07/23/1934, 78 y.o.   MRN: [id number]  HPI  The patient presents for a follow-up of  chronic hypertension, chronic dyslipidemia, A fib, tremor C/o fatigue - not better Her HR, BPs - OK at home    Review of Systems  Constitutional: Positive for fatigue. Negative for chills, activity change, appetite change and unexpected weight change.  HENT: Negative for congestion, mouth sores and sinus pressure.   Eyes: Negative for visual disturbance.  Respiratory: Negative for cough and chest tightness.   Gastrointestinal: Negative for nausea and abdominal pain.  Genitourinary: Negative for frequency, difficulty urinating and vaginal pain.  Musculoskeletal: Positive for back pain and arthralgias. Negative for gait problem.  Skin: Negative for pallor and rash.  Neurological: Positive for tremors. Negative for dizziness, weakness, numbness and headaches.  Psychiatric/Behavioral: Negative for confusion and sleep disturbance.       Objective:   Physical Exam  Constitutional: She appears well-developed and well-nourished. No distress.  HENT:  Head: Normocephalic.  Right Ear: External ear normal.  Left Ear: External ear normal.  Nose: Nose normal.  Mouth/Throat: Oropharynx is clear and moist.  Eyes: Conjunctivae are normal. Pupils are equal, round, and reactive to light. Right eye exhibits no discharge. Left eye exhibits no discharge.  Neck: Normal range of motion. Neck supple. No JVD present. No tracheal deviation present. No thyromegaly present.  Cardiovascular: Normal rate and normal heart sounds.        irreg irreg  Pulmonary/Chest: No stridor. No respiratory distress. She has no wheezes.  Abdominal: Soft. Bowel sounds are normal. She exhibits no distension and no mass. There is no tenderness. There is no rebound and no guarding.  Musculoskeletal: She exhibits no edema and no tenderness.  Lymphadenopathy:    She has no cervical  adenopathy.  Neurological: She displays normal reflexes. No cranial nerve deficit. She exhibits normal muscle tone. Coordination normal.  Skin: No rash noted. No erythema.  Psychiatric: She has a normal mood and affect. Her behavior is normal. Judgment and thought content normal.          Assessment & Plan:   Get labs

## 2011-09-23 NOTE — Assessment & Plan Note (Signed)
Post surgery; chronic Meds are likely to be contributing She will discuss meds w/Dr Lovena Le

## 2011-09-24 ENCOUNTER — Other Ambulatory Visit (INDEPENDENT_AMBULATORY_CARE_PROVIDER_SITE_OTHER): Payer: Medicare Other

## 2011-09-24 DIAGNOSIS — M545 Low back pain: Secondary | ICD-10-CM

## 2011-09-24 DIAGNOSIS — I4891 Unspecified atrial fibrillation: Secondary | ICD-10-CM

## 2011-09-24 DIAGNOSIS — E785 Hyperlipidemia, unspecified: Secondary | ICD-10-CM

## 2011-09-24 DIAGNOSIS — M1990 Unspecified osteoarthritis, unspecified site: Secondary | ICD-10-CM

## 2011-09-24 DIAGNOSIS — E559 Vitamin D deficiency, unspecified: Secondary | ICD-10-CM

## 2011-09-24 DIAGNOSIS — E538 Deficiency of other specified B group vitamins: Secondary | ICD-10-CM

## 2011-09-24 LAB — HEPATIC FUNCTION PANEL
ALT: 17 U/L (ref 0–35)
Albumin: 3.6 g/dL (ref 3.5–5.2)
Total Protein: 6.5 g/dL (ref 6.0–8.3)

## 2011-09-24 LAB — CBC WITH DIFFERENTIAL/PLATELET
Basophils Absolute: 0.1 10*3/uL (ref 0.0–0.1)
Eosinophils Relative: 4.9 % (ref 0.0–5.0)
Lymphocytes Relative: 54.8 % — ABNORMAL HIGH (ref 12.0–46.0)
Monocytes Relative: 9.3 % (ref 3.0–12.0)
Neutrophils Relative %: 29.2 % — ABNORMAL LOW (ref 43.0–77.0)
Platelets: 205 10*3/uL (ref 150.0–400.0)
WBC: 3.5 10*3/uL — ABNORMAL LOW (ref 4.5–10.5)

## 2011-09-24 LAB — LIPID PANEL
Cholesterol: 249 mg/dL — ABNORMAL HIGH (ref 0–200)
HDL: 41.6 mg/dL (ref >39.00)
VLDL: 26.8 mg/dL (ref 0.0–40.0)

## 2011-09-24 LAB — URINALYSIS
Bilirubin Urine: NEGATIVE
Nitrite: NEGATIVE
Urine Glucose: NEGATIVE
Urobilinogen, UA: 0.2 (ref 0.0–1.0)

## 2011-09-24 LAB — CK: Total CK: 112 U/L (ref 7–177)

## 2011-09-24 LAB — BASIC METABOLIC PANEL
BUN: 16 mg/dL (ref 6–23)
Calcium: 8.8 mg/dL (ref 8.4–10.5)
Creatinine, Ser: 0.7 mg/dL (ref 0.4–1.2)
GFR: 90.94 mL/min (ref >60.00)
Potassium: 4.3 mEq/L (ref 3.5–5.1)

## 2011-09-24 LAB — URIC ACID: Uric Acid, Serum: 4.8 mg/dL (ref 2.4–7.0)

## 2011-09-27 ENCOUNTER — Other Ambulatory Visit: Payer: Self-pay | Admitting: Internal Medicine

## 2011-09-28 ENCOUNTER — Other Ambulatory Visit: Payer: Self-pay | Admitting: *Deleted

## 2011-09-28 ENCOUNTER — Other Ambulatory Visit: Payer: Self-pay

## 2011-09-28 MED ORDER — METOPROLOL TARTRATE 25 MG PO TABS: 25.0000 mg | ORAL_TABLET | Freq: Two times a day (BID) | ORAL | Status: DC

## 2011-09-28 NOTE — Telephone Encounter (Signed)
Pt informed of recent lab results. How should she take simvastatin?

## 2011-09-29 ENCOUNTER — Ambulatory Visit (INDEPENDENT_AMBULATORY_CARE_PROVIDER_SITE_OTHER): Payer: Medicare Other | Admitting: Internal Medicine

## 2011-09-29 DIAGNOSIS — R42 Dizziness and giddiness: Secondary | ICD-10-CM

## 2011-09-29 DIAGNOSIS — R55 Syncope and collapse: Secondary | ICD-10-CM

## 2011-09-29 DIAGNOSIS — I4891 Unspecified atrial fibrillation: Secondary | ICD-10-CM

## 2011-09-29 DIAGNOSIS — I495 Sick sinus syndrome: Secondary | ICD-10-CM

## 2011-09-29 NOTE — Progress Notes (Signed)
HPI Alyssa Luna returns today for followup. She is a very pleasant 78 year old woman with paroxysmal atrial fibrillation, sinus bradycardia, who has had 2 episodes where she "felt funny". The patient has not had frank syncope. She has been well controlled on low dose flecainide and beta blockade. No chest pain, shortness of breath, or peripheral edema. She has occasional episodes where she stands up quickly and will get dizzy. Allergies  Allergen Reactions  . YKD:[id number]+[id number] Acid+Aspartame     REACTION: diarrhea  . Simvastatin     REACTION: leg cramps, weakness     Current Outpatient Prescriptions  Medication Sig Dispense Refill  . acetaminophen (TYLENOL) 500 MG tablet Take 500 mg by mouth as needed.        [name] aspirin 81 MG EC tablet Take 81 mg by mouth daily.        . Cholecalciferol 1000 UNITS tablet Take 1,000 Units by mouth daily.        . Cyanocobalamin (VITAMIN B-12 CR) 1000 MCG TBCR Take by mouth daily.        . flecainide (TAMBOCOR) 50 MG tablet TAKE 2 TABLETS ONCE DAILY  180 tablet  2  . furosemide (LASIX) 20 MG tablet as needed.       . meloxicam (MOBIC) 7.5 MG tablet Take 7.5 mg by mouth as needed.      . metoprolol tartrate (LOPRESSOR) 25 MG tablet Take 1 tablet (25 mg total) by mouth 2 (two) times daily.  180 tablet  2  . omeprazole (PRILOSEC) 20 MG capsule Take 20 mg by mouth daily.        [name] warfarin (COUMADIN) 3 MG tablet TAKE AS DIRECTED BY COUMADIN CLINIC  180 tablet  0  . DISCONTD: meloxicam (MOBIC) 7.5 MG tablet Take 1 tablet (7.5 mg total) by mouth daily.  30 tablet  3     Past Medical History  Diagnosis Date  . Colon polyps   . Diverticulosis of colon   . GERD (gastroesophageal reflux disease)   . Osteoarthritis   . Osteoporosis   . TR (tricuspid regurgitation)     Mild  . Vitamin B12 deficiency   . Vitamin d deficiency   . History of breast cancer   . Hyperlipidemia   . Familial tremor   . LBP (low back pain)   . Cataract   .  Macular degeneration   . Atrial fibrillation     D Latessa Tillis  . TIA (transient ischemic attack)   . Breast cancer     ROS:   All systems reviewed and negative except as noted in the HPI.   Past Surgical History  Procedure Date  . Abdominal hysterectomy   . Breast enhancement surgery   . Cataract extraction   . Total knee arthroplasty Dec 2011    Right - Dr Noemi Chapel  . Bilateral mastectomy      Family History  Problem Relation Age of Onset  . Tremor Other   . Hypertension Other   . Early death Neg Hx   . Stroke Neg Hx   . Parkinsonism Mother   . Parkinsonism Brother      History   Social History  . Marital Status: Married    Spouse Name: N/A    Number of Children: N/A  . Years of Education: N/A   Occupational History  . [employer], Retired    Social History Main Topics  . Smoking status: Never Smoker   . Smokeless tobacco: Not on file  . Alcohol Use:  No  . Drug Use: No  . Sexually Active: Not on file   Other Topics Concern  . Not on file   Social History Narrative   Regular Exercise -  NO     BP 120/60  Pulse 48  Ht 5' 7.5" (1.715 m)  Wt 73.392 kg (161 lb 12.8 oz)  BMI 24.97 kg/m2  Physical Exam:  Well appearing elderly woman, NAD HEENT: Unremarkable Neck:  No JVD, no thyromegally Lungs:  Clear with no wheezes, rales, or rhonchi. HEART:  Regular bradycardia, no murmurs, no rubs, no clicks Abd:  soft, positive bowel sounds, no organomegally, no rebound, no guarding Ext:  2 plus pulses, no edema, no cyanosis, no clubbing Skin:  No rashes no nodules Neuro:  CN II through XII intact, motor grossly intact  EKG Sinus bradycardia  Assess/Plan:

## 2011-09-29 NOTE — Assessment & Plan Note (Signed)
She appears to be maintaining sinus rhythm very nicely. She'll continue her current medical therapy.

## 2011-09-29 NOTE — Patient Instructions (Signed)
Your physician recommends that you schedule a follow-up appointment in: 3 months with Dr Lovena Le  Your physician has recommended that you wear a holter monitor. Holter monitors are medical devices that record the heart's electrical activity. Doctors most often use these monitors to diagnose arrhythmias. Arrhythmias are problems with the speed or rhythm of the heartbeat. The monitor is a small, portable device. You can wear one while you do your normal daily activities. This is usually used to diagnose what is causing palpitations/syncope (passing out).--24 hour for dixxiness

## 2011-09-29 NOTE — Assessment & Plan Note (Signed)
She denies frank syncope. She has had a couple of near syncopal episodes. I recommended that she undergo a cardiac monitor to try to correlate symptoms with her arrhythmias.

## 2011-10-01 NOTE — Telephone Encounter (Signed)
Try Lipitor 10 mg 1/2 tab every day Thx

## 2011-10-02 ENCOUNTER — Telehealth: Payer: Self-pay

## 2011-10-06 ENCOUNTER — Encounter: Payer: Self-pay | Admitting: Internal Medicine

## 2011-10-06 ENCOUNTER — Ambulatory Visit (INDEPENDENT_AMBULATORY_CARE_PROVIDER_SITE_OTHER): Payer: Medicare Other | Admitting: *Deleted

## 2011-10-06 ENCOUNTER — Encounter (INDEPENDENT_AMBULATORY_CARE_PROVIDER_SITE_OTHER): Payer: Medicare Other

## 2011-10-06 DIAGNOSIS — I4891 Unspecified atrial fibrillation: Secondary | ICD-10-CM

## 2011-10-06 DIAGNOSIS — Z7901 Long term (current) use of anticoagulants: Secondary | ICD-10-CM

## 2011-10-06 LAB — POCT INR: INR: 2.1

## 2011-10-06 MED ORDER — ATORVASTATIN CALCIUM 10 MG PO TABS: 10.0000 mg | ORAL_TABLET | Freq: Every day | ORAL | Status: DC

## 2011-10-07 NOTE — Telephone Encounter (Signed)
Patient had monitor put on

## 2011-10-13 ENCOUNTER — Other Ambulatory Visit: Payer: Self-pay | Admitting: Cardiology

## 2011-11-03 ENCOUNTER — Ambulatory Visit (INDEPENDENT_AMBULATORY_CARE_PROVIDER_SITE_OTHER): Payer: Medicare Other

## 2011-11-03 DIAGNOSIS — Z7901 Long term (current) use of anticoagulants: Secondary | ICD-10-CM

## 2011-11-03 DIAGNOSIS — I4891 Unspecified atrial fibrillation: Secondary | ICD-10-CM

## 2011-11-26 ENCOUNTER — Ambulatory Visit (INDEPENDENT_AMBULATORY_CARE_PROVIDER_SITE_OTHER): Payer: Medicare Other | Admitting: *Deleted

## 2011-11-26 DIAGNOSIS — Z7901 Long term (current) use of anticoagulants: Secondary | ICD-10-CM

## 2011-11-26 DIAGNOSIS — I4891 Unspecified atrial fibrillation: Secondary | ICD-10-CM

## 2011-11-26 LAB — POCT INR: INR: 2.6

## 2011-12-07 ENCOUNTER — Telehealth: Payer: Self-pay | Admitting: Internal Medicine

## 2011-12-07 NOTE — Telephone Encounter (Signed)
Patient request return call from nurse regarding possible dental procedure and coumadin meds.  Patient can be reached at [phone].

## 2011-12-07 NOTE — Telephone Encounter (Signed)
Spoke with pt. Pt lost crown on a back tooth. She went to an oral surgeon Dr  Sabra Heck.  He recommended pt have an implant (more invasive)  or have a tooth extraction with a partial bridge (less invasive). Pt had knee replacement 07/2010. Pt concerned because she would need to be off coumadin for either procedure.

## 2011-12-07 NOTE — Telephone Encounter (Signed)
Pt also concerned because she had been told she may need a pacemaker. Pt states Dr Sabra Heck is requesting clearance from Dr Lovena Le. I will forward to Dr Jacolyn Reedy  for review. Please follow-up with pt on cell [phone].

## 2011-12-09 NOTE — Telephone Encounter (Signed)
Discussed with Dr Lovena Le with patients hx of prior TIA she will need Lovenox bridge

## 2011-12-11 NOTE — Telephone Encounter (Signed)
Fu msg Patient was calling back about this issue

## 2011-12-11 NOTE — Telephone Encounter (Signed)
Spoke with patient  She is aware she will need Lovenox bridge and I will have to forward this to [facility] clinic to set up

## 2011-12-11 NOTE — Telephone Encounter (Signed)
Spoke with pt.  She does not have a date for the procedure at this time.  Pt is aware we need at least 5 days advanced notice to get her set up for the Lovenox.

## 2011-12-16 ENCOUNTER — Telehealth: Payer: Self-pay | Admitting: Internal Medicine

## 2011-12-16 NOTE — Telephone Encounter (Signed)
New Problem:     Patient called in because she is having as tooth extraction and she needs to take Lovanox shots five days in advance.  Please call back and feel free to try her cell number if she is unavailable at her home number.

## 2011-12-16 NOTE — Telephone Encounter (Signed)
Spoke to pt she is scheduled for one tooth extraction with Dr Sabra Heck 01/05/12 Tuesday . She has chosen a less invasive procedure than previously discussed as per note on 4/22. She is unsure at this time if Dr Sabra Heck wants her to stop her coumadin and do bridging or just continue her coumadin. Asked pt to call Dr Sabra Heck and clarify if she needs to be off her coumadin. Pt verbalizes understanding and will call the coumadin clinic to inform of Dr Ammie Ferrier preference.

## 2011-12-30 ENCOUNTER — Encounter: Payer: Medicare Other | Admitting: Internal Medicine

## 2012-01-01 ENCOUNTER — Encounter: Payer: Self-pay | Admitting: Internal Medicine

## 2012-01-01 ENCOUNTER — Ambulatory Visit (INDEPENDENT_AMBULATORY_CARE_PROVIDER_SITE_OTHER): Payer: Medicare Other | Admitting: Internal Medicine

## 2012-01-01 ENCOUNTER — Ambulatory Visit (INDEPENDENT_AMBULATORY_CARE_PROVIDER_SITE_OTHER): Payer: Medicare Other | Admitting: Pharmacist

## 2012-01-01 VITALS — BP 134/66 | HR 56 | Ht 67.5 in | Wt 159.0 lb

## 2012-01-01 DIAGNOSIS — Z7901 Long term (current) use of anticoagulants: Secondary | ICD-10-CM

## 2012-01-01 DIAGNOSIS — I4891 Unspecified atrial fibrillation: Secondary | ICD-10-CM

## 2012-01-01 DIAGNOSIS — R55 Syncope and collapse: Secondary | ICD-10-CM

## 2012-01-01 LAB — POCT INR: INR: 2.8

## 2012-01-01 NOTE — Assessment & Plan Note (Signed)
Her symptoms appear to be well-controlled. She will continue her current medical therapy.

## 2012-01-01 NOTE — Assessment & Plan Note (Signed)
She has had no recurrent episodes of syncope or near syncope. She will continue her current medical therapy. No indication for permanent pacemaker at this time.

## 2012-01-01 NOTE — Progress Notes (Signed)
HPI Alyssa Luna returns today for followup. She is a very pleasant 78 year old woman with paroxysmal atrial fibrillation who is been very nicely controlled with flecainide. She also has hypertension. She has rare palpitations. Today she brings in a log of her blood pressure and heart rates and she has gone out of rhythm approximately once a month. She denies syncope, cough, hemoptysis, or chest pain. Allergies  Allergen Reactions  . Ciprofloxacin     GI upset   . Simvastatin     REACTION: leg cramps, weakness     Current Outpatient Prescriptions  Medication Sig Dispense Refill  . acetaminophen (TYLENOL) 500 MG tablet Take 500 mg by mouth as needed.        [name] aspirin 81 MG EC tablet Take 81 mg by mouth daily.        [name] atorvastatin (LIPITOR) 10 MG tablet Take 5 mg by mouth daily.      . Cholecalciferol 1000 UNITS tablet Take 1,000 Units by mouth daily.        . Cyanocobalamin (VITAMIN B-12 CR) 1000 MCG TBCR Take by mouth daily.        . flecainide (TAMBOCOR) 50 MG tablet TAKE 2 TABLETS ONCE DAILY  180 tablet  2  . furosemide (LASIX) 20 MG tablet as needed.       . metoprolol tartrate (LOPRESSOR) 25 MG tablet Take 1 tablet (25 mg total) by mouth 2 (two) times daily.  180 tablet  2  . omeprazole (PRILOSEC) 20 MG capsule Take 20 mg by mouth daily.        [name] warfarin (COUMADIN) 3 MG tablet TAKE AS DIRECTED BY COUMADIN CLINIC  180 tablet  1  . DISCONTD: atorvastatin (LIPITOR) 10 MG tablet Take 1 tablet (10 mg total) by mouth daily.  90 tablet  0     Past Medical History  Diagnosis Date  . Colon polyps   . Diverticulosis of colon   . GERD (gastroesophageal reflux disease)   . Osteoarthritis   . Osteoporosis   . TR (tricuspid regurgitation)     Mild  . Vitamin B12 deficiency   . Vitamin d deficiency   . History of breast cancer   . Hyperlipidemia   . Familial tremor   . LBP (low back pain)   . Cataract   . Macular degeneration   . Atrial fibrillation     D Rilen Shukla  . TIA  (transient ischemic attack)   . Breast cancer     ROS:   All systems reviewed and negative except as noted in the HPI.   Past Surgical History  Procedure Date  . Abdominal hysterectomy   . Breast enhancement surgery   . Cataract extraction   . Total knee arthroplasty Dec 2011    Right - Dr Noemi Chapel  . Bilateral mastectomy      Family History  Problem Relation Age of Onset  . Tremor Other   . Hypertension Other   . Early death Neg Hx   . Stroke Neg Hx   . Parkinsonism Mother   . Parkinsonism Brother      History   Social History  . Marital Status: Married    Spouse Name: N/A    Number of Children: N/A  . Years of Education: N/A   Occupational History  . [employer], Retired    Social History Main Topics  . Smoking status: Never Smoker   . Smokeless tobacco: Not on file  . Alcohol Use: No  . Drug Use:  No  . Sexually Active: Not on file   Other Topics Concern  . Not on file   Social History Narrative   Regular Exercise -  NO     BP 134/66  Pulse 56  Ht 5' 7.5" (1.715 m)  Wt 159 lb (72.122 kg)  BMI 24.54 kg/m2  Physical Exam:  Well appearing NAD HEENT: Unremarkable Neck:  No JVD, no thyromegally Lymphatics:  No adenopathy Back:  No CVA tenderness Lungs:  Clear HEART:  Regular rate rhythm, no murmurs, no rubs, no clicks Abd:  soft, positive bowel sounds, no organomegally, no rebound, no guarding Ext:  2 plus pulses, no edema, no cyanosis, no clubbing Skin:  No rashes no nodules Neuro:  CN II through XII intact, motor grossly intact  EKG Sinus bradycardia.  Assess/Plan:

## 2012-01-05 ENCOUNTER — Ambulatory Visit: Payer: Medicare Other | Admitting: Internal Medicine

## 2012-01-05 ENCOUNTER — Telehealth: Payer: Self-pay | Admitting: Internal Medicine

## 2012-01-05 NOTE — Telephone Encounter (Signed)
ORAL SURGERY TOMORROW, NEED OK TO HAVE ORAL SURGERY

## 2012-01-05 NOTE — Telephone Encounter (Signed)
Dr Lovena Le spoke with the MD

## 2012-01-18 ENCOUNTER — Ambulatory Visit (INDEPENDENT_AMBULATORY_CARE_PROVIDER_SITE_OTHER): Payer: Medicare Other | Admitting: Internal Medicine

## 2012-01-18 ENCOUNTER — Encounter: Payer: Self-pay | Admitting: Internal Medicine

## 2012-01-18 VITALS — BP 108/60 | HR 80 | Temp 97.7°F | Resp 16 | Wt 160.0 lb

## 2012-01-18 DIAGNOSIS — E538 Deficiency of other specified B group vitamins: Secondary | ICD-10-CM

## 2012-01-18 DIAGNOSIS — R5381 Other malaise: Secondary | ICD-10-CM

## 2012-01-18 DIAGNOSIS — R5383 Other fatigue: Secondary | ICD-10-CM

## 2012-01-18 DIAGNOSIS — I4891 Unspecified atrial fibrillation: Secondary | ICD-10-CM

## 2012-01-18 DIAGNOSIS — M545 Low back pain: Secondary | ICD-10-CM

## 2012-01-18 NOTE — Assessment & Plan Note (Signed)
Continue with current prescription therapy as reflected on the Med list.  

## 2012-01-18 NOTE — Progress Notes (Signed)
Patient ID: Alyssa Luna, female   DOB: 24-Mar-1934, 78 y.o.   MRN: [id number]  Subjective:    Patient ID: Alyssa Luna, female    DOB: 07-18-1934, 78 y.o.   MRN: [id number]  HPI  The patient presents for a follow-up of  chronic hypertension, chronic dyslipidemia, A fib, tremor. F/u on fatigue - better Her HR, BPs - OK at home  BP Readings from Last 3 Encounters:  01/18/12 108/60  01/01/12 134/66  09/29/11 120/60   Wt Readings from Last 3 Encounters:  01/18/12 160 lb (72.576 kg)  01/01/12 159 lb (72.122 kg)  09/29/11 161 lb 12.8 oz (73.392 kg)      Review of Systems  Constitutional: Positive for fatigue. Negative for chills, activity change, appetite change and unexpected weight change.  HENT: Negative for congestion, mouth sores and sinus pressure.   Eyes: Negative for visual disturbance.  Respiratory: Negative for cough and chest tightness.   Gastrointestinal: Negative for nausea and abdominal pain.  Genitourinary: Negative for frequency, difficulty urinating and vaginal pain.  Musculoskeletal: Positive for back pain and arthralgias. Negative for gait problem.  Skin: Negative for pallor and rash.  Neurological: Positive for tremors. Negative for dizziness, weakness, numbness and headaches.  Psychiatric/Behavioral: Negative for confusion and sleep disturbance.       Objective:   Physical Exam  Constitutional: She appears well-developed and well-nourished. No distress.  HENT:  Head: Normocephalic.  Right Ear: External ear normal.  Left Ear: External ear normal.  Nose: Nose normal.  Mouth/Throat: Oropharynx is clear and moist.  Eyes: Conjunctivae are normal. Pupils are equal, round, and reactive to light. Right eye exhibits no discharge. Left eye exhibits no discharge.  Neck: Normal range of motion. Neck supple. No JVD present. No tracheal deviation present. No thyromegaly present.  Cardiovascular: Normal rate and normal heart sounds.        irreg irreg    Pulmonary/Chest: No stridor. No respiratory distress. She has no wheezes.  Abdominal: Soft. Bowel sounds are normal. She exhibits no distension and no mass. There is no tenderness. There is no rebound and no guarding.  Musculoskeletal: She exhibits no edema and no tenderness.  Lymphadenopathy:    She has no cervical adenopathy.  Neurological: She displays normal reflexes. No cranial nerve deficit. She exhibits normal muscle tone. Coordination normal.  Skin: No rash noted. No erythema.  Psychiatric: She has a normal mood and affect. Her behavior is normal. Judgment and thought content normal.   Lab Results  Component Value Date   WBC 3.5* 09/24/2011   HGB 13.0 09/24/2011   HCT 38.0 09/24/2011   PLT 205.0 09/24/2011   GLUCOSE 81 09/24/2011   CHOL 249* 09/24/2011   TRIG 134.0 09/24/2011   HDL 41.60 09/24/2011   LDLDIRECT 178.0 09/24/2011   LDLCALC 81 07/21/2010   ALT 17 09/24/2011   AST 23 09/24/2011   NA 140 09/24/2011   K 4.3 09/24/2011   CL 106 09/24/2011   CREATININE 0.7 09/24/2011   BUN 16 09/24/2011   CO2 27 09/24/2011   TSH 2.05 09/24/2011   INR 2.8 01/01/2012          Assessment & Plan:

## 2012-01-18 NOTE — Assessment & Plan Note (Signed)
Resolved

## 2012-02-02 ENCOUNTER — Ambulatory Visit (INDEPENDENT_AMBULATORY_CARE_PROVIDER_SITE_OTHER): Payer: Medicare Other | Admitting: *Deleted

## 2012-02-02 DIAGNOSIS — Z7901 Long term (current) use of anticoagulants: Secondary | ICD-10-CM

## 2012-02-02 DIAGNOSIS — I4891 Unspecified atrial fibrillation: Secondary | ICD-10-CM

## 2012-02-02 LAB — POCT INR: INR: 2.7

## 2012-03-08 ENCOUNTER — Ambulatory Visit (INDEPENDENT_AMBULATORY_CARE_PROVIDER_SITE_OTHER): Payer: Medicare Other

## 2012-03-08 DIAGNOSIS — Z7901 Long term (current) use of anticoagulants: Secondary | ICD-10-CM

## 2012-03-08 DIAGNOSIS — I4891 Unspecified atrial fibrillation: Secondary | ICD-10-CM

## 2012-03-08 LAB — POCT INR: INR: 2.2

## 2012-03-09 ENCOUNTER — Encounter: Payer: Self-pay | Admitting: Internal Medicine

## 2012-03-09 ENCOUNTER — Ambulatory Visit (INDEPENDENT_AMBULATORY_CARE_PROVIDER_SITE_OTHER): Payer: Medicare Other | Admitting: Internal Medicine

## 2012-03-09 VITALS — BP 140/78 | HR 80 | Temp 97.4°F | Resp 16 | Wt 161.0 lb

## 2012-03-09 DIAGNOSIS — R682 Dry mouth, unspecified: Secondary | ICD-10-CM

## 2012-03-09 DIAGNOSIS — R251 Tremor, unspecified: Secondary | ICD-10-CM

## 2012-03-09 DIAGNOSIS — R259 Unspecified abnormal involuntary movements: Secondary | ICD-10-CM

## 2012-03-09 DIAGNOSIS — R42 Dizziness and giddiness: Secondary | ICD-10-CM

## 2012-03-09 DIAGNOSIS — K219 Gastro-esophageal reflux disease without esophagitis: Secondary | ICD-10-CM

## 2012-03-09 DIAGNOSIS — J40 Bronchitis, not specified as acute or chronic: Secondary | ICD-10-CM

## 2012-03-09 DIAGNOSIS — K117 Disturbances of salivary secretion: Secondary | ICD-10-CM

## 2012-03-09 MED ORDER — MECLIZINE HCL 12.5 MG PO TABS: 12.5000 mg | ORAL_TABLET | Freq: Three times a day (TID) | ORAL | Status: AC | PRN

## 2012-03-09 MED ORDER — PROMETHAZINE-CODEINE 6.25-10 MG/5ML PO SYRP: 5.0000 mL | ORAL_SOLUTION | ORAL | Status: AC | PRN

## 2012-03-09 MED ORDER — CEFUROXIME AXETIL 250 MG PO TABS: 250.0000 mg | ORAL_TABLET | Freq: Two times a day (BID) | ORAL | Status: AC

## 2012-03-09 NOTE — Patient Instructions (Signed)
Benign Positional Vertigo symptoms Start Meclizine. Start Laruth Bouchard - Daroff exercise several times a day as dirrected.

## 2012-03-09 NOTE — Assessment & Plan Note (Signed)
Ceftin x10d Prom/cod prn

## 2012-03-09 NOTE — Assessment & Plan Note (Signed)
Continue with current prescription therapy as reflected on the Med list.  

## 2012-03-09 NOTE — Assessment & Plan Note (Signed)
2013 ?due to meds Discussed

## 2012-03-09 NOTE — Assessment & Plan Note (Signed)
7/13 BPV Benign Positional Vertigo symptoms Start Meclizine. Start Laruth Bouchard - Daroff exercise several times a day as dirrected.

## 2012-03-09 NOTE — Progress Notes (Signed)
   Subjective:    Patient ID: Alyssa Luna, female    DOB: 1934/02/21, 78 y.o.   MRN: [id number]  Cough Pertinent negatives include no chills, headaches or rash.   C/o cough w/green sputum x 5-6 d C/o dizziness off and on daily  The patient presents for a follow-up of  chronic hypertension, chronic dyslipidemia, A fib, tremor. F/u on  Her HR, BPs - OK at home  BP Readings from Last 3 Encounters:  03/09/12 140/78  01/18/12 108/60  01/01/12 134/66   Wt Readings from Last 3 Encounters:  03/09/12 161 lb (73.029 kg)  01/18/12 160 lb (72.576 kg)  01/01/12 159 lb (72.122 kg)      Review of Systems  Constitutional: Positive for fatigue. Negative for chills, activity change, appetite change and unexpected weight change.  HENT: Negative for congestion, mouth sores and sinus pressure.   Eyes: Negative for visual disturbance.  Respiratory: Positive for cough. Negative for chest tightness.   Gastrointestinal: Negative for nausea and abdominal pain.  Genitourinary: Negative for frequency, difficulty urinating and vaginal pain.  Musculoskeletal: Positive for back pain and arthralgias. Negative for gait problem.  Skin: Negative for pallor and rash.  Neurological: Positive for tremors. Negative for dizziness, weakness, numbness and headaches.  Psychiatric/Behavioral: Negative for confusion and disturbed wake/sleep cycle.       Objective:   Physical Exam  Constitutional: She appears well-developed and well-nourished. No distress.  HENT:  Head: Normocephalic.  Right Ear: External ear normal.  Left Ear: External ear normal.  Nose: Nose normal.  Mouth/Throat: Oropharynx is clear and moist.  Eyes: Conjunctivae are normal. Pupils are equal, round, and reactive to light. Right eye exhibits no discharge. Left eye exhibits no discharge.  Neck: Normal range of motion. Neck supple. No JVD present. No tracheal deviation present. No thyromegaly present.  Cardiovascular: Normal rate and  normal heart sounds.        irreg irreg  Pulmonary/Chest: No stridor. No respiratory distress. She has no wheezes.  Abdominal: Soft. Bowel sounds are normal. She exhibits no distension and no mass. There is no tenderness. There is no rebound and no guarding.  Musculoskeletal: She exhibits no edema and no tenderness.  Lymphadenopathy:    She has no cervical adenopathy.  Neurological: She displays normal reflexes. No cranial nerve deficit. She exhibits normal muscle tone. Coordination normal.  Skin: No rash noted. No erythema.  Psychiatric: She has a normal mood and affect. Her behavior is normal. Judgment and thought content normal.  Eryth throat  Lab Results  Component Value Date   WBC 3.5* 09/24/2011   HGB 13.0 09/24/2011   HCT 38.0 09/24/2011   PLT 205.0 09/24/2011   GLUCOSE 81 09/24/2011   CHOL 249* 09/24/2011   TRIG 134.0 09/24/2011   HDL 41.60 09/24/2011   LDLDIRECT 178.0 09/24/2011   LDLCALC 81 07/21/2010   ALT 17 09/24/2011   AST 23 09/24/2011   NA 140 09/24/2011   K 4.3 09/24/2011   CL 106 09/24/2011   CREATININE 0.7 09/24/2011   BUN 16 09/24/2011   CO2 27 09/24/2011   TSH 2.05 09/24/2011   INR 2.2 03/08/2012          Assessment & Plan:

## 2012-03-28 ENCOUNTER — Other Ambulatory Visit: Payer: Self-pay | Admitting: Cardiology

## 2012-04-15 ENCOUNTER — Ambulatory Visit (INDEPENDENT_AMBULATORY_CARE_PROVIDER_SITE_OTHER): Payer: Medicare Other

## 2012-04-15 DIAGNOSIS — Z7901 Long term (current) use of anticoagulants: Secondary | ICD-10-CM

## 2012-04-15 DIAGNOSIS — I4891 Unspecified atrial fibrillation: Secondary | ICD-10-CM

## 2012-04-20 ENCOUNTER — Ambulatory Visit: Payer: Medicare Other | Admitting: Internal Medicine

## 2012-05-10 ENCOUNTER — Ambulatory Visit (INDEPENDENT_AMBULATORY_CARE_PROVIDER_SITE_OTHER): Payer: Medicare Other | Admitting: Internal Medicine

## 2012-05-10 ENCOUNTER — Telehealth: Payer: Self-pay | Admitting: Internal Medicine

## 2012-05-10 ENCOUNTER — Encounter: Payer: Self-pay | Admitting: Internal Medicine

## 2012-05-10 ENCOUNTER — Ambulatory Visit (INDEPENDENT_AMBULATORY_CARE_PROVIDER_SITE_OTHER): Payer: Medicare Other | Admitting: Pharmacist

## 2012-05-10 ENCOUNTER — Ambulatory Visit (INDEPENDENT_AMBULATORY_CARE_PROVIDER_SITE_OTHER)
Admission: RE | Admit: 2012-05-10 | Discharge: 2012-05-10 | Disposition: A | Discharge status: Home / Self Care | Payer: Medicare Other | Source: Ambulatory Visit | Attending: Internal Medicine | Admitting: Internal Medicine

## 2012-05-10 ENCOUNTER — Ambulatory Visit: Payer: Self-pay

## 2012-05-10 VITALS — BP 134/80 | HR 72 | Temp 97.8°F | Resp 16 | Wt 159.0 lb

## 2012-05-10 DIAGNOSIS — Z7901 Long term (current) use of anticoagulants: Secondary | ICD-10-CM

## 2012-05-10 DIAGNOSIS — M545 Low back pain: Secondary | ICD-10-CM

## 2012-05-10 DIAGNOSIS — M25569 Pain in unspecified knee: Secondary | ICD-10-CM

## 2012-05-10 DIAGNOSIS — I4891 Unspecified atrial fibrillation: Secondary | ICD-10-CM

## 2012-05-10 DIAGNOSIS — R682 Dry mouth, unspecified: Secondary | ICD-10-CM

## 2012-05-10 DIAGNOSIS — M5430 Sciatica, unspecified side: Secondary | ICD-10-CM

## 2012-05-10 DIAGNOSIS — M25559 Pain in unspecified hip: Secondary | ICD-10-CM

## 2012-05-10 DIAGNOSIS — M25551 Pain in right hip: Secondary | ICD-10-CM

## 2012-05-10 DIAGNOSIS — K219 Gastro-esophageal reflux disease without esophagitis: Secondary | ICD-10-CM

## 2012-05-10 DIAGNOSIS — K117 Disturbances of salivary secretion: Secondary | ICD-10-CM

## 2012-05-10 DIAGNOSIS — E538 Deficiency of other specified B group vitamins: Secondary | ICD-10-CM

## 2012-05-10 DIAGNOSIS — Z23 Encounter for immunization: Secondary | ICD-10-CM

## 2012-05-10 LAB — POCT INR: INR: 2.5

## 2012-05-10 NOTE — Progress Notes (Signed)
   Subjective:    Patient ID: Alyssa Luna, female    DOB: May 18, 1934, 78 y.o.   MRN: [id number]  HPI C/o dry mouth x long time The patient presents for a follow-up of  chronic hypertension, chronic dyslipidemia, A fib, tremor. F/u on  Her HR, BPs - OK at home  BP Readings from Last 3 Encounters:  05/10/12 134/80  03/09/12 140/78  01/18/12 108/60   Wt Readings from Last 3 Encounters:  05/10/12 159 lb (72.122 kg)  03/09/12 161 lb (73.029 kg)  01/18/12 160 lb (72.576 kg)      Review of Systems  Constitutional: Positive for fatigue. Negative for activity change, appetite change and unexpected weight change.  HENT: Negative for congestion, mouth sores and sinus pressure.   Eyes: Negative for visual disturbance.  Respiratory: Negative for chest tightness.   Gastrointestinal: Negative for nausea and abdominal pain.  Genitourinary: Negative for frequency, difficulty urinating and vaginal pain.  Musculoskeletal: Positive for back pain and arthralgias. Negative for gait problem.  Skin: Negative for pallor.  Neurological: Positive for tremors. Negative for dizziness, weakness and numbness.  Psychiatric/Behavioral: Negative for confusion and disturbed wake/sleep cycle.       Objective:   Physical Exam  Constitutional: She appears well-developed and well-nourished. No distress.  HENT:  Head: Normocephalic.  Right Ear: External ear normal.  Left Ear: External ear normal.  Nose: Nose normal.  Mouth/Throat: Oropharynx is clear and moist.  Eyes: Conjunctivae normal are normal. Pupils are equal, round, and reactive to light. Right eye exhibits no discharge. Left eye exhibits no discharge.  Neck: Normal range of motion. Neck supple. No JVD present. No tracheal deviation present. No thyromegaly present.  Cardiovascular: Normal rate and normal heart sounds.        irreg irreg  Pulmonary/Chest: No stridor. No respiratory distress. She has no wheezes.  Abdominal: Soft. Bowel sounds  are normal. She exhibits no distension and no mass. There is no tenderness. There is no rebound and no guarding.  Musculoskeletal: She exhibits no edema and no tenderness.  Lymphadenopathy:    She has no cervical adenopathy.  Neurological: She displays normal reflexes. No cranial nerve deficit. She exhibits normal muscle tone. Coordination normal.  Skin: No rash noted. No erythema.  Psychiatric: She has a normal mood and affect. Her behavior is normal. Judgment and thought content normal.    Lab Results  Component Value Date   WBC 3.5* 09/24/2011   HGB 13.0 09/24/2011   HCT 38.0 09/24/2011   PLT 205.0 09/24/2011   GLUCOSE 81 09/24/2011   CHOL 249* 09/24/2011   TRIG 134.0 09/24/2011   HDL 41.60 09/24/2011   LDLDIRECT 178.0 09/24/2011   LDLCALC 81 07/21/2010   ALT 17 09/24/2011   AST 23 09/24/2011   NA 140 09/24/2011   K 4.3 09/24/2011   CL 106 09/24/2011   CREATININE 0.7 09/24/2011   BUN 16 09/24/2011   CO2 27 09/24/2011   TSH 2.05 09/24/2011   INR 3.2 04/15/2012          Assessment & Plan:

## 2012-05-10 NOTE — Assessment & Plan Note (Signed)
Continue with current prescription therapy as reflected on the Med list.  

## 2012-05-10 NOTE — Assessment & Plan Note (Signed)
[facility] Ortho consult Tramadol prn

## 2012-05-10 NOTE — Addendum Note (Signed)
Addended by: Cresenciano Lick on: 05/10/2012 01:50 PM   Modules accepted: Orders

## 2012-05-10 NOTE — Telephone Encounter (Signed)
Alyssa Luna, please, inform patient that the hip joint has mild OA changes Thx

## 2012-05-10 NOTE — Assessment & Plan Note (Signed)
2013 ?due to meds -- probably Flecanide related

## 2012-05-11 NOTE — Telephone Encounter (Signed)
No - it could be a pinched nerve - schiatica. Will sch ortho cons - who would she like to see- Dr Noemi Chapel? THx

## 2012-05-11 NOTE — Telephone Encounter (Signed)
Pt informed. Could this be causing her numbness in her Right leg or is that possibly from her spine curvature? Should she see a orthopedist?

## 2012-05-12 NOTE — Telephone Encounter (Signed)
Pt informed- she would ike to see Dr. Junius Roads at [facility] ortho.

## 2012-05-13 NOTE — Telephone Encounter (Signed)
Ok - will ref Thx

## 2012-05-20 ENCOUNTER — Encounter: Payer: Self-pay | Admitting: Internal Medicine

## 2012-05-27 ENCOUNTER — Encounter: Payer: Self-pay | Admitting: Physician Assistant

## 2012-06-06 ENCOUNTER — Encounter: Payer: Self-pay | Admitting: *Deleted

## 2012-06-07 ENCOUNTER — Ambulatory Visit (INDEPENDENT_AMBULATORY_CARE_PROVIDER_SITE_OTHER): Payer: Medicare Other | Admitting: Physician Assistant

## 2012-06-07 ENCOUNTER — Telehealth: Payer: Self-pay | Admitting: *Deleted

## 2012-06-07 ENCOUNTER — Ambulatory Visit (INDEPENDENT_AMBULATORY_CARE_PROVIDER_SITE_OTHER): Payer: Medicare Other

## 2012-06-07 ENCOUNTER — Encounter: Payer: Self-pay | Admitting: Physician Assistant

## 2012-06-07 VITALS — BP 124/62 | HR 64 | Ht 63.0 in | Wt 163.4 lb

## 2012-06-07 DIAGNOSIS — Z7901 Long term (current) use of anticoagulants: Secondary | ICD-10-CM

## 2012-06-07 DIAGNOSIS — Z800 Family history of malignant neoplasm of digestive organs: Secondary | ICD-10-CM

## 2012-06-07 DIAGNOSIS — Z1211 Encounter for screening for malignant neoplasm of colon: Secondary | ICD-10-CM

## 2012-06-07 DIAGNOSIS — I4891 Unspecified atrial fibrillation: Secondary | ICD-10-CM

## 2012-06-07 DIAGNOSIS — K219 Gastro-esophageal reflux disease without esophagitis: Secondary | ICD-10-CM

## 2012-06-07 DIAGNOSIS — Z853 Personal history of malignant neoplasm of breast: Secondary | ICD-10-CM

## 2012-06-07 DIAGNOSIS — R1310 Dysphagia, unspecified: Secondary | ICD-10-CM

## 2012-06-07 LAB — POCT INR: INR: 2.6

## 2012-06-07 MED ORDER — MOVIPREP 100 G PO SOLR: 1.0000 | Freq: Once | ORAL | Status: DC

## 2012-06-07 NOTE — Progress Notes (Addendum)
Subjective:    Patient ID: Alyssa Luna, female    DOB: 09/09/1933, 77 y.o.   MRN: [id number]  HPI Alyssa Luna  is a pleasant 78 year old female known previously to Dr. Lyla Luna with history of chronic GERD, diverticulosis and probable remote colon polyps. She last had colonoscopy with Dr. Velora Luna  in 2006 and this was a negative exam with the exception of moderate diverticulosis left-sided and external hemorrhoids. She comes in today for recall colonoscopy discussion. She says she has not been having any problems with her bowels, no changes in her bowel habits and no abdominal  pain. She says occasionally she sees a scant amount of bright red blood on the tissue which she has attributed to hemorrhoids. She is on chronic Prilosec for GERD, and says that she has had some intermittent difficulty swallowing over the past year or so. She says this may occur 2 or 3 times monthly generally occurs at the beginning of a meal. She says she swallows feels as if food sticks in has to stop eating and stretch out etc. eventually the food goes on down and she's able to continue a meal. She has been on chronic Coumadin with history of atrial fibrillation which has recently been well controlled with flecainide. She has personal history of breast cancer, and family history is pertinent for her maternal aunt with history of colon cancer.    Review of Systems  Constitutional: Negative.   HENT: Positive for trouble swallowing.   Eyes: Negative.   Respiratory: Negative.   Cardiovascular: Negative.   Gastrointestinal: Negative.   Genitourinary: Negative.   Musculoskeletal: Negative.   Skin: Negative.   Neurological: Negative.   Hematological: Negative.   Psychiatric/Behavioral: Negative.    Allergies  Allergen Reactions  . Ciprofloxacin     GI upset   . Simvastatin     REACTION: leg cramps, weakness   Outpatient Prescriptions Prior to Visit  Medication Sig Dispense Refill  . acetaminophen (TYLENOL)  500 MG tablet Take 500 mg by mouth as needed.        [name] aspirin 81 MG EC tablet Take 81 mg by mouth daily.        [name] atorvastatin (LIPITOR) 10 MG tablet Take 5 mg by mouth daily.      . Cholecalciferol 1000 UNITS tablet Take 1,000 Units by mouth daily.        . Cyanocobalamin (VITAMIN B-12 CR) 1000 MCG TBCR Take by mouth daily.        . flecainide (TAMBOCOR) 50 MG tablet TAKE 2 TABLETS ONCE DAILY  180 tablet  2  . metoprolol tartrate (LOPRESSOR) 25 MG tablet Take 1 tablet (25 mg total) by mouth 2 (two) times daily.  180 tablet  2  . omeprazole (PRILOSEC) 20 MG capsule Take 20 mg by mouth daily.        . traMADol (ULTRAM) 50 MG tablet Take 50-100 mg by mouth 2 (two) times daily as needed.      . warfarin (COUMADIN) 3 MG tablet TAKE AS DIRECTED BY COUMADIN CLINIC  180 tablet  0  . meloxicam (MOBIC) 7.5 MG tablet Take 7.5 mg by mouth daily.       Patient Active Problem List  Diagnosis  . VITAMIN B12 DEFICIENCY  . VITAMIN D DEFICIENCY  . TRICUSPID REGURGITATION, MILD  . Atrial fibrillation  . BRONCHITIS, ACUTE  . CHRONIC MAXILLARY SINUSITIS  . GERD  . DIVERTICULOSIS, COLON  . OSTEOARTHRITIS  . EFFUSION OF JOINT OTHER SPECIFIED SITE  .  KNEE PAIN  . LOW BACK PAIN  . LEG PAIN  . Cramp of limb  . OSTEOPOROSIS  . SYNCOPE  . EDEMA  . COUGH  . DIARRHEA  . ABDOMINAL PAIN  . Nonspecific (abnormal) findings on radiological and other examination of body structure  . Personal History of Malignant Neoplasm of Breast  . COLONIC POLYPS, HX OF  . HEMATURIA, MICROSCOPIC, HX OF  . ABNORMAL CHEST XRAY  . CAROTID BRUIT  . NAUSEA  . TIA (transient ischemic attack)  . Abnormal CT of brain  . Meningioma  . Dyslipidemia  . Tremor  . Bronchitis  . Vertigo  . Dry mouth  . Right hip pain   History  Substance Use Topics  . Smoking status: Never Smoker   . Smokeless tobacco: Not on file  . Alcohol Use: No       Objective:   Physical Exam well-developed elderly white female in no acute  distress, pleasant blood pressure 124/62 pulse 64 height 5 foot 3 weight 163. HEENT; nontraumatic normocephalic EOMI PERRLA sclera anicteric, Neck; Supple no JVD, Cardiovascular; regular rate and rhythm with S1-S2 no murmur rub or gallop, Pulmonary; clear bilaterally, Abdomen; soft nontender nondistended bowel sounds active no palpable mass or hepatosplenomegaly, Rectal; exam not done, Extremities; trace edema bilateral, Psych; mood and affect normal and appropriate.        Assessment & Plan:  #82 78 year old female with personal history of breast cancer and family history of colon cancer, presenting for  re call colonoscopy. Last colonoscopy January 2006 negative #2 chronic anticoagulation with Coumadin #3 history of atrial fibrillation #4 personal history of breast cancer #5 diverticulosis #6 chronic GERD with intermittent solid food dysphagia times one year- rule out peptic stricture  Plan; will schedule for colonoscopy with Dr. Hilarie Luna. Procedure was discussed in detail with the patient and she is agreeable to proceed Will also schedule for upper endoscopy with esophageal dilation if indicated with Dr. Hilarie Luna Patient will need to come off her Coumadin for 5 days prior to these procedures. We will obtain consent from her cardiologist Dr. Lovena Luna Continue Prilosec 20 mg by mouth daily  Addendum: Reviewed and agree with initial management. Await word from Dr. Lovena Luna re: holding warfarin and need for Lovenox bridge  Alyssa Bears, MD

## 2012-06-07 NOTE — Patient Instructions (Addendum)
You have been scheduled for an endoscopy and colonoscopy with propofol. Please follow the written instructions given to you at your visit today. Please pick up your prep at the pharmacy within the next 1-3 days. If you use inhalers (even only as needed), please bring them with you on the day of your procedure.  We have sent the following medications to your pharmacy for you to pick up at your convenience: Moviprep

## 2012-06-07 NOTE — Telephone Encounter (Signed)
06/07/2012    RE: Alyssa Luna DOB: 05-Feb-1934 MRN: [id number]   Dear Dr. Lovena Le,   We have scheduled the above patient for an endoscopic procedure. Our records show that she is on anticoagulation therapy.   Please advise as to how long the patient may come off her therapy of Coumadin prior to the procedure, which is scheduled for 06-20-2012.  Please fax back/ or route the completed form to Hope Pigeon at [phone].   Sincerely,  Hope Pigeon

## 2012-06-08 NOTE — Telephone Encounter (Signed)
The patient has had a documented TIA and must either not go off of anti-coagulation or have Lovenox around the procedure.

## 2012-06-09 ENCOUNTER — Telehealth: Payer: Self-pay | Admitting: Internal Medicine

## 2012-06-09 ENCOUNTER — Telehealth: Payer: Self-pay | Admitting: *Deleted

## 2012-06-09 NOTE — Telephone Encounter (Signed)
Pt reports she changed her appt to 06/30/12 because she felt she needed more time prior to Lovenox bridge for her ECL. Advised her someone from the Coumadin Clinic will call her. Pt stated understanding and states if we try to call her tomorrow, she will not be home and call her on her cell phone.

## 2012-06-09 NOTE — Telephone Encounter (Signed)
Per Dr. Hilarie Fredrickson ok to have Lovenox bridge up to 12 hours before procedure.  Called patient and told her what Dr. Lovena Le and Dr. Hilarie Fredrickson recommendations are and she will need a Lovenox bridge.  Advised patient that Porfirio Oar at the coumadin clinic will call her with the details and instructions. Advised patient if she does not hear from Porfirio Oar today or tomorrow then she needs to call because her procedure is on November 4.  Called patient and patient verbalized understanding.   I spoke with Lelon Frohlich at coumadin clinic and she will get message to Porfirio Oar because Porfirio Oar is off until tomorrow

## 2012-06-09 NOTE — Telephone Encounter (Signed)
Okay, please have Dr. Tanna Furry office given instructions on Lovenox bridge.  She will need to hold the lovenox 12 hours before procedure.  I will advise when to resume after procedure.  Thanks

## 2012-06-09 NOTE — Telephone Encounter (Signed)
Dr. Hilarie Fredrickson please read Dr Tanna Furry note regarding above patient's coumadin recommendations. Please advise

## 2012-06-09 NOTE — Telephone Encounter (Signed)
Pt unaware of how long she needs to be off her coumadin.  She will contact her GI physician to find out and will call us back or have that office call us for an ok and advice on lovenox.

## 2012-06-09 NOTE — Telephone Encounter (Signed)
New problem:  Schedule for endo & colonoscopy on 11/14. Please advise on Lovenox bridges when to stop coumadin.

## 2012-06-13 ENCOUNTER — Telehealth: Payer: Self-pay

## 2012-06-13 NOTE — Telephone Encounter (Signed)
Done-coumadin clinic will arrange lovenox bridge

## 2012-06-13 NOTE — Telephone Encounter (Signed)
Received call from patient stating she has had episodes of atrial fib.States this past Saturday atrial fib with heart rates ranging 125 to 140 beats/min.BP114/61.States took her 6:00 pm metoprolol and flecainide at 4:00 pm.States yesterday 06/12/12 woke up at 3:30 am with heart pounding so she took her 6:00 am metoprolol and flecainide at 3:30 am.States she has noticed her heart rate still elevated at 125 beats/min B/P 92/74.Spoke to DOD Dr.Allred he advised take a extra flecainide now,and make appointment to see Dr.Taylor this week.Appointment scheduled with Dr.Taylor Wednesday 06/15/12 at 4:15 pm.

## 2012-06-13 NOTE — Telephone Encounter (Signed)
New problem:  C/O Afib heart rate 136 on sat . Took medication went down to  60. Around 3 am heart rate 126. Took flecainide & metoprolol . B/p 126/83. Heart rate 119. Now.

## 2012-06-14 NOTE — Telephone Encounter (Signed)
See 06/13/12 note

## 2012-06-15 ENCOUNTER — Ambulatory Visit (INDEPENDENT_AMBULATORY_CARE_PROVIDER_SITE_OTHER): Payer: Medicare Other | Admitting: *Deleted

## 2012-06-15 ENCOUNTER — Ambulatory Visit (INDEPENDENT_AMBULATORY_CARE_PROVIDER_SITE_OTHER): Payer: Medicare Other | Admitting: Internal Medicine

## 2012-06-15 ENCOUNTER — Other Ambulatory Visit: Payer: Self-pay

## 2012-06-15 ENCOUNTER — Encounter: Payer: Self-pay | Admitting: Internal Medicine

## 2012-06-15 VITALS — BP 142/68 | HR 52 | Ht 63.0 in | Wt 161.8 lb

## 2012-06-15 DIAGNOSIS — Z7901 Long term (current) use of anticoagulants: Secondary | ICD-10-CM

## 2012-06-15 DIAGNOSIS — I4891 Unspecified atrial fibrillation: Secondary | ICD-10-CM

## 2012-06-15 DIAGNOSIS — G459 Transient cerebral ischemic attack, unspecified: Secondary | ICD-10-CM

## 2012-06-15 MED ORDER — FLECAINIDE ACETATE 50 MG PO TABS: 75.0000 mg | ORAL_TABLET | Freq: Two times a day (BID) | ORAL | Status: DC

## 2012-06-15 MED ORDER — WARFARIN SODIUM 3 MG PO TABS: ORAL_TABLET | ORAL | Status: DC

## 2012-06-15 MED ORDER — METOPROLOL TARTRATE 25 MG PO TABS: 12.5000 mg | ORAL_TABLET | Freq: Two times a day (BID) | ORAL | Status: DC

## 2012-06-15 MED ORDER — ENOXAPARIN SODIUM 80 MG/0.8ML ~~LOC~~ SOLN: 80.0000 mg | Freq: Two times a day (BID) | SUBCUTANEOUS | Status: DC

## 2012-06-15 NOTE — Patient Instructions (Signed)
Increase Flecainide to 1.5 tablets (34m) twice daily.  Decrease Metoprolol to 1/2 tab (12.527m twice daily.  Your physician wants you to follow-up in: 6 months with Dr. TaLovena Le You will receive a reminder letter in the mail two months in advance. If you don't receive a letter, please call our office to schedule the follow-up appointment.

## 2012-06-15 NOTE — Patient Instructions (Signed)
Nov 8th last dose of coumadin Nov 9th no coumadin nor Lovenox Nov 10th no coumadin Lovenox 80 mg in am and 80 mg in PM Nov 11th no coumadin Lovenox 80 mg in am and 80 mg in pm Nov 12th  No coumadin Lovenox 80 mg in am and 80 mg in pm Nov 13th no coumadin Lovenox 80 mg in am only none in the PM Nov 14th  Day of procedure no coumadin nor lovenox  When instructed by MD that does procedures when to restart coumadin and lovenox take as previously ordered and with coumadin dose take extra 1/2 tab for 2 days when restart and continue lovenox and coumadin until seen in clinic on November 18th

## 2012-06-15 NOTE — Assessment & Plan Note (Signed)
She has developed increasingly frequent episodes of atrial fibrillation plus or minus atrial flutter. The regularity of her heart rate would suggest atrial flutter. I've recommended that the patient increase her dose of flecainide to 75 mg twice daily. In addition, she is instructed to take 50 mg of additional flecainide if she goes out of rhythm. I plan to see the patient back in several months. I've asked the patient to reduce her dose of metoprolol to half tablet twice daily. She may ultimately require backup pacing she has sinus bradycardia and tachycardia bradycardia syndrome. I do not think she is quite symptomatic for this yet though she is getting closer.

## 2012-06-15 NOTE — Progress Notes (Signed)
HPI Alyssa Luna returns today for followup. She is a very pleasant 78 year old woman with a history of paroxysmal atrial fibrillation. She has been well-controlled on low-dose flecainide and low-dose metoprolol. Over the last couple of weeks, she has had increased fatigue. Several days ago, she had tachycardia palpitations which lasted for approximately 12 hours. She noted that her heart rate was in the 120 to 130 range. Much of the time the rate was fixed around 125 beats per minute.  The patient took an extra dose of flecainide 50 mg, and eventually her palpitations subsided. She is felt well since except for some mild fatigue. When she checks her blood pressure, she notes a heart rate of around 55 beats per minute sometimes less. Allergies  Allergen Reactions  . Ciprofloxacin     GI upset   . Simvastatin     REACTION: leg cramps, weakness     Current Outpatient Prescriptions  Medication Sig Dispense Refill  . acetaminophen (TYLENOL) 500 MG tablet Take 500 mg by mouth as needed.        [name] aspirin 81 MG EC tablet Take 81 mg by mouth daily.        [name] atorvastatin (LIPITOR) 10 MG tablet Take 5 mg by mouth daily.      . Cholecalciferol 1000 UNITS tablet Take 1,000 Units by mouth daily.        . Cyanocobalamin (VITAMIN B-12 CR) 1000 MCG TBCR Take by mouth daily.        . flecainide (TAMBOCOR) 50 MG tablet Take 1.5 tablets (75 mg total) by mouth 2 (two) times daily.  135 tablet  3  . metoprolol tartrate (LOPRESSOR) 25 MG tablet Take 0.5 tablets (12.5 mg total) by mouth 2 (two) times daily.  90 tablet  3  . MOVIPREP 100 G SOLR Take 1 kit (100 g total) by mouth once.  1 kit  0  . omeprazole (PRILOSEC) 20 MG capsule Take 20 mg by mouth daily.        . traMADol (ULTRAM) 50 MG tablet Take 50-100 mg by mouth 2 (two) times daily as needed.      . warfarin (COUMADIN) 3 MG tablet Take as directed by the coumadin clinic.  180 tablet  0  . DISCONTD: flecainide (TAMBOCOR) 50 MG tablet TAKE 2 TABLETS ONCE  DAILY  180 tablet  2  . DISCONTD: metoprolol tartrate (LOPRESSOR) 25 MG tablet Take 1 tablet (25 mg total) by mouth 2 (two) times daily.  180 tablet  2  . DISCONTD: warfarin (COUMADIN) 3 MG tablet TAKE AS DIRECTED BY COUMADIN CLINIC  180 tablet  0     Past Medical History  Diagnosis Date  . Colon polyps   . Diverticulosis of colon   . GERD (gastroesophageal reflux disease)   . Osteoarthritis   . Osteoporosis   . TR (tricuspid regurgitation)     Mild  . Vitamin B12 deficiency   . Vitamin D deficiency   . History of breast cancer   . Hyperlipidemia   . Familial tremor   . LBP (low back pain)   . Cataract   . Macular degeneration   . Atrial fibrillation     D Taylor  . TIA (transient ischemic attack)   . Breast cancer   . Hemorrhoids     ROS:   All systems reviewed and negative except as noted in the HPI.   Past Surgical History  Procedure Date  . Abdominal hysterectomy   . Breast enhancement surgery   .  Cataract extraction   . Total knee arthroplasty Dec 2011    Right - Dr Noemi Chapel  . Bilateral mastectomy      Family History  Problem Relation Age of Onset  . Tremor Other   . Hypertension Other   . Early death Neg Hx   . Stroke Neg Hx   . Parkinsonism Mother   . Parkinsonism Brother   . Colon cancer Maternal Aunt      History   Social History  . Marital Status: Married    Spouse Name: N/A    Number of Children: N/A  . Years of Education: N/A   Occupational History  . [employer], Retired    Social History Main Topics  . Smoking status: Never Smoker   . Smokeless tobacco: Not on file  . Alcohol Use: No  . Drug Use: No  . Sexually Active: Not on file   Other Topics Concern  . Not on file   Social History Narrative   Regular Exercise -  NO     BP 142/68  Pulse 52  Ht 5' 3"  (1.6 m)  Wt 161 lb 12.8 oz (73.392 kg)  BMI 28.66 kg/m2  Physical Exam:  Well appearing elderly woman, NAD HEENT: Unremarkable Neck:  No JVD, no thyromegally Lungs:   Clear with no wheezes, rales, or rhonchi. HEART:  Regular rate rhythm, no murmurs, no rubs, no clicks Abd:  soft, positive bowel sounds, no organomegally, no rebound, no guarding Ext:  2 plus pulses, no edema, no cyanosis, no clubbing Skin:  No rashes no nodules Neuro:  CN II through XII intact, motor grossly intact  EKG Sinus bradycardia  Assess/Plan:

## 2012-06-15 NOTE — Assessment & Plan Note (Signed)
The patient has had no recurrent symptoms but is on warfarin. She is pending colonoscopy. I recommend that she be bridged with Lovenox because of her prior TIA.

## 2012-06-16 LAB — BASIC METABOLIC PANEL
Calcium: 8.9 mg/dL (ref 8.4–10.5)
GFR: 84.89 mL/min (ref >60.00)
Glucose, Bld: 83 mg/dL (ref 70–99)
Sodium: 139 mEq/L (ref 135–145)

## 2012-06-20 ENCOUNTER — Encounter: Payer: Medicare Other | Admitting: Internal Medicine

## 2012-06-22 ENCOUNTER — Telehealth: Payer: Self-pay | Admitting: *Deleted

## 2012-06-22 ENCOUNTER — Other Ambulatory Visit: Payer: Self-pay | Admitting: *Deleted

## 2012-06-22 ENCOUNTER — Other Ambulatory Visit: Payer: Self-pay | Admitting: Internal Medicine

## 2012-06-22 DIAGNOSIS — I4891 Unspecified atrial fibrillation: Secondary | ICD-10-CM

## 2012-06-22 MED ORDER — FLECAINIDE ACETATE 50 MG PO TABS: 75.0000 mg | ORAL_TABLET | Freq: Two times a day (BID) | ORAL | Status: DC

## 2012-06-22 NOTE — Telephone Encounter (Signed)
When calling this number you get only Spanish speaking people.  They hang up on you when you tell them you can not speak Spanish

## 2012-06-22 NOTE — Telephone Encounter (Signed)
Patient called stating [facility] has to talk to nurse about Flecanide dosage Told patient i will pass message to Dr Tanna Furry nurse.  [phone] REF: [phone]

## 2012-06-29 ENCOUNTER — Telehealth: Payer: Self-pay | Admitting: Internal Medicine

## 2012-06-29 NOTE — Telephone Encounter (Signed)
Regarding the patient asking [place] if she should take the antibiotic before the colonoscopy, I spoke to our nurse Clinical supervisor, Barb Merino RN [place].  She said our doctors do not prescribe antibiotics before procedures, however,  If her Orthopedist wants her to have an antibiotic before the procedure that is fine, we are in agreement to that.  She thanked me for checking and will call her Doctor that manages her knee replacement.

## 2012-06-30 ENCOUNTER — Ambulatory Visit (AMBULATORY_SURGERY_CENTER): Payer: Medicare Other | Admitting: Internal Medicine

## 2012-06-30 ENCOUNTER — Encounter: Payer: Self-pay | Admitting: Internal Medicine

## 2012-06-30 VITALS — BP 114/72 | HR 49 | Temp 98.2°F | Resp 18 | Ht 63.0 in | Wt 163.0 lb

## 2012-06-30 DIAGNOSIS — Z1211 Encounter for screening for malignant neoplasm of colon: Secondary | ICD-10-CM

## 2012-06-30 DIAGNOSIS — Z800 Family history of malignant neoplasm of digestive organs: Secondary | ICD-10-CM

## 2012-06-30 DIAGNOSIS — D126 Benign neoplasm of colon, unspecified: Secondary | ICD-10-CM

## 2012-06-30 DIAGNOSIS — K635 Polyp of colon: Secondary | ICD-10-CM

## 2012-06-30 DIAGNOSIS — K219 Gastro-esophageal reflux disease without esophagitis: Secondary | ICD-10-CM

## 2012-06-30 DIAGNOSIS — R1310 Dysphagia, unspecified: Secondary | ICD-10-CM

## 2012-06-30 DIAGNOSIS — Z86010 Personal history of colonic polyps: Secondary | ICD-10-CM

## 2012-06-30 MED ORDER — SODIUM CHLORIDE 0.9 % IV SOLN: 500.0000 mL | INTRAVENOUS | Status: DC

## 2012-06-30 NOTE — Op Note (Signed)
[place]  [address] [place], [address]   COLONOSCOPY PROCEDURE REPORT  PATIENT: Alyssa Luna, Alyssa Luna  MR#: [id number] BIRTHDATE: [birth year] , 78  yrs. old GENDER: Female ENDOSCOPIST: Jerene Bears, MD REFERRED MZ:[id number], GI PROCEDURE DATE:  06/30/2012 PROCEDURE:   Colonoscopy with snare polypectomy ASA CLASS:   Class III INDICATIONS:elevated risk screening, patient's personal history of colon polyps, and last colonoscopy performed 2006. MEDICATIONS: MAC sedation, administered by CRNA and Propofol (Diprivan) 200 mg IV  DESCRIPTION OF PROCEDURE:   After the risks benefits and alternatives of the procedure were thoroughly explained, informed consent was obtained.  A digital rectal exam revealed no rectal mass.   The LB [id number]-Q180AL [id number]  endoscope was introduced through the anus and advanced to the cecum, which was identified by both the appendix and ileocecal valve. No adverse events experienced. The quality of the prep was good, using MoviPrep  The instrument was then slowly withdrawn as the colon was fully examined.   COLON FINDINGS: A sessile polyp measuring 5 mm in size was found in the ascending colon.  A polypectomy was performed with a cold snare.  The resection was complete and the polyp tissue was not retrieved.   There was moderate diverticulosis noted in the ascending colon, and severe diverticulosis noted in the descending colon, and sigmoid colon with associated muscular hypertrophy. Small internal hemorrhoids were found.  Retroflexed views revealed internal hemorrhoids. The time to cecum=7 minutes 15 seconds. Withdrawal time=13 minutes 05 seconds.  The scope was withdrawn and the procedure completed. COMPLICATIONS: There were no complications.  ENDOSCOPIC IMPRESSION: 1.   Sessile polyp measuring 5 mm in size was found in the ascending colon; polypectomy was performed with a cold snare 2.   There was severe diverticulosis noted in the  ascending colon, descending colon, and sigmoid colon 3.   Small internal hemorrhoids  RECOMMENDATIONS: 1.  High fiber diet 2.  Resume Lovenox injections tomorrow.  Can resume warfarin with tonight's dose.  Follow instructions given by your Coumadin clinic 3.  Given your age, you will not need another colonoscopy for colon cancer screening or polyp surveillance.  These types of tests usually stop around the age 83.   eSigned:  Jerene Bears, MD 06/30/2012 2:26 PM  cc: [place].  Plotnikov, MD and The Patient   PATIENT NAME:  Kanda, Deluna MR#: [id number]

## 2012-06-30 NOTE — Progress Notes (Signed)
Patient did not experience any of the following events: a burn prior to discharge; a fall within the facility; wrong site/side/patient/procedure/implant event; or a hospital transfer or hospital admission upon discharge from the facility. (G8907) Patient did not have preoperative order for IV antibiotic SSI prophylaxis. (G8918)  

## 2012-06-30 NOTE — Patient Instructions (Addendum)
Discharge instructions given with verbal understanding. Handouts on diverticulosis,hemorrhoids,hiatal hernia and a dilatation diet given. Resume previous medications. YOU HAD AN ENDOSCOPIC PROCEDURE TODAY AT [facility]: Refer to the procedure report that was given to you for any specific questions about what was found during the examination.  If the procedure report does not answer your questions, please call your gastroenterologist to clarify.  If you requested that your care partner not be given the details of your procedure findings, then the procedure report has been included in a sealed envelope for you to review at your convenience later.  YOU SHOULD EXPECT: Some feelings of bloating in the abdomen. Passage of more gas than usual.  Walking can help get rid of the air that was put into your GI tract during the procedure and reduce the bloating. If you had a lower endoscopy (such as a colonoscopy or flexible sigmoidoscopy) you may notice spotting of blood in your stool or on the toilet paper. If you underwent a bowel prep for your procedure, then you may not have a normal bowel movement for a few days.  DIET: Your first meal following the procedure should be a light meal and then it is ok to progress to your normal diet.  A half-sandwich or bowl of soup is an example of a good first meal.  Heavy or fried foods are harder to digest and may make you feel nauseous or bloated.  Likewise meals heavy in dairy and vegetables can cause extra gas to form and this can also increase the bloating.  Drink plenty of fluids but you should avoid alcoholic beverages for 24 hours.  ACTIVITY: Your care partner should take you home directly after the procedure.  You should plan to take it easy, moving slowly for the rest of the day.  You can resume normal activity the day after the procedure however you should NOT DRIVE or use heavy machinery for 24 hours (because of the sedation medicines used during the  test).    SYMPTOMS TO REPORT IMMEDIATELY: A gastroenterologist can be reached at any hour.  During normal business hours, 8:30 AM to 5:00 PM Monday through Friday, call [phone].  After hours and on weekends, please call the GI answering service at [phone] who will take a message and have the physician on call contact you.   Following lower endoscopy (colonoscopy or flexible sigmoidoscopy):  Excessive amounts of blood in the stool  Significant tenderness or worsening of abdominal pains  Swelling of the abdomen that is new, acute  Fever of 100F or higher  Following upper endoscopy (EGD)  Vomiting of blood or coffee ground material  New chest pain or pain under the shoulder blades  Painful or persistently difficult swallowing  New shortness of breath  Fever of 100F or higher  Black, tarry-looking stools  FOLLOW UP: If any biopsies were taken you will be contacted by phone or by letter within the next 1-3 weeks.  Call your gastroenterologist if you have not heard about the biopsies in 3 weeks.  Our staff will call the home number listed on your records the next business day following your procedure to check on you and address any questions or concerns that you may have at that time regarding the information given to you following your procedure. This is a courtesy call and so if there is no answer at the home number and we have not heard from you through the emergency physician on call, we will  assume that you have returned to your regular daily activities without incident.  SIGNATURES/CONFIDENTIALITY: You and/or your care partner have signed paperwork which will be entered into your electronic medical record.  These signatures attest to the fact that that the information above on your After Visit Summary has been reviewed and is understood.  Full responsibility of the confidentiality of this discharge information lies with you and/or your care-partner.

## 2012-06-30 NOTE — Progress Notes (Signed)
Pt states she has A Fib, states she has  bilateral mastectomy but cancer on the left side only so okay to stick the right side, no egg or soy allergy. ewm

## 2012-06-30 NOTE — Op Note (Signed)
[facility]  [address]. [place], [id number]   ENDOSCOPY PROCEDURE REPORT  PATIENT: Alyssa Luna, Alyssa Luna  MR#: [id number] BIRTHDATE: 01-23-1934 , 78  yrs. old GENDER: Female ENDOSCOPIST: Jerene Bears, MD ASSISTANT:   Randall Hiss, RN REFERRED BY:Amy Hudson, PA-C PROCEDURE DATE:  06/30/2012 PROCEDURE:   EGD with balloon dilatation ASA CLASS:   Class III INDICATIONS:dysphagia and GERD. MEDICATIONS: MAC sedation, administered by CRNA and propofol (Diprivan) 130m IV TOPICAL ANESTHETIC:   Cetacaine Spray  DESCRIPTION OF PROCEDURE:   After the risks benefits and alternatives of the procedure were thoroughly explained, informed consent was obtained.  The LB-GIF-H180 [id number] endoscope was introduced through the mouth  and advanced to the second portion of the duodenum ,      The instrument was slowly withdrawn as the mucosa was carefully examined.   ESOPHAGUS: A 5 cm hiatal hernia was noted.   A Schatzki ring was found 33 cm from the incisors and was widely open.  STOMACH: The mucosa of the stomach appeared normal.  DUODENUM: The duodenal mucosa showed no abnormalities in the bulb and second portion of the duodenum.     Dilation was then performed at the gastroesophageal junction  Dilator:Balloon Size:13.5 and 15 mm  Resistance: minimal Heme: yes, though minimal  Appearance: satisfactory  COMPLICATIONS: There were no complications. ENDOSCOPIC IMPRESSION: 1.   5 cm hiatal hernia 2.   Schatzki ring was found 33 cm from the incisors 3.   The mucosa of the stomach appeared normal 4.   The duodenal mucosa showed no abnormalities in the bulb and second portion of the duodenum  RECOMMENDATIONS: 1.  Continue current medications 2.  Resume Lovenox injections tomorrow.  Can resume warfarin with tonight's dose.  Follow instructions given by your Coumadin clinic 3.  Office follow-up as needed  eSigned:  JJerene Bears MD 06/30/2012 2:20 PM     CFS:ELTR V. Plotnikov, MD The Patient

## 2012-07-01 ENCOUNTER — Telehealth: Payer: Self-pay | Admitting: *Deleted

## 2012-07-01 NOTE — Telephone Encounter (Signed)
  Follow up Call-  Call back number 06/30/2012  Post procedure Call Back phone  # [phone]  Permission to leave phone message Yes     Patient questions:  Do you have a fever, pain , or abdominal swelling? no Pain Score  0 *  Have you tolerated food without any problems? yes  Have you been able to return to your normal activities? yes  Do you have any questions about your discharge instructions: Diet   no Medications  no Follow up visit  no  Do you have questions or concerns about your Care? no  Actions: * If pain score is 4 or above: No action needed, pain <4.

## 2012-07-04 ENCOUNTER — Ambulatory Visit (INDEPENDENT_AMBULATORY_CARE_PROVIDER_SITE_OTHER): Payer: Medicare Other | Admitting: *Deleted

## 2012-07-04 DIAGNOSIS — I4891 Unspecified atrial fibrillation: Secondary | ICD-10-CM

## 2012-07-04 LAB — POCT INR: INR: 1.8

## 2012-07-12 ENCOUNTER — Ambulatory Visit (INDEPENDENT_AMBULATORY_CARE_PROVIDER_SITE_OTHER): Payer: Medicare Other | Admitting: *Deleted

## 2012-07-12 DIAGNOSIS — Z7901 Long term (current) use of anticoagulants: Secondary | ICD-10-CM

## 2012-07-12 DIAGNOSIS — I4891 Unspecified atrial fibrillation: Secondary | ICD-10-CM

## 2012-08-04 ENCOUNTER — Ambulatory Visit (INDEPENDENT_AMBULATORY_CARE_PROVIDER_SITE_OTHER): Payer: Medicare Other | Admitting: *Deleted

## 2012-08-04 ENCOUNTER — Encounter: Payer: Self-pay | Admitting: Internal Medicine

## 2012-08-04 ENCOUNTER — Ambulatory Visit (INDEPENDENT_AMBULATORY_CARE_PROVIDER_SITE_OTHER): Payer: Medicare Other | Admitting: Internal Medicine

## 2012-08-04 VITALS — BP 138/74 | HR 72 | Temp 96.4°F | Resp 16 | Wt 164.0 lb

## 2012-08-04 DIAGNOSIS — Z7901 Long term (current) use of anticoagulants: Secondary | ICD-10-CM

## 2012-08-04 DIAGNOSIS — K219 Gastro-esophageal reflux disease without esophagitis: Secondary | ICD-10-CM

## 2012-08-04 DIAGNOSIS — E538 Deficiency of other specified B group vitamins: Secondary | ICD-10-CM

## 2012-08-04 DIAGNOSIS — M545 Low back pain: Secondary | ICD-10-CM

## 2012-08-04 DIAGNOSIS — G459 Transient cerebral ischemic attack, unspecified: Secondary | ICD-10-CM

## 2012-08-04 DIAGNOSIS — I4891 Unspecified atrial fibrillation: Secondary | ICD-10-CM

## 2012-08-04 DIAGNOSIS — E785 Hyperlipidemia, unspecified: Secondary | ICD-10-CM

## 2012-08-04 NOTE — Assessment & Plan Note (Signed)
Continue with current prescription therapy as reflected on the Med list.  

## 2012-08-04 NOTE — Assessment & Plan Note (Signed)
Coumadin

## 2012-08-04 NOTE — Progress Notes (Signed)
   Subjective:    Patient ID: Alyssa Luna, female    DOB: [birth year], 78 y.o.   MRN: [id number]  HPI F/u on dry mouth x long time.  The patient presents for a follow-up of  chronic hypertension, chronic dyslipidemia, A fib, tremor. She is taking Lipitor 1/2 tab qd  Her HR, BPs - OK at home  BP Readings from Last 3 Encounters:  08/04/12 138/74  06/30/12 114/72  06/15/12 142/68   Wt Readings from Last 3 Encounters:  08/04/12 164 lb (74.39 kg)  06/30/12 163 lb (73.936 kg)  06/15/12 161 lb 12.8 oz (73.392 kg)      Review of Systems  Constitutional: Positive for fatigue. Negative for activity change, appetite change and unexpected weight change.  HENT: Negative for congestion, mouth sores and sinus pressure.   Eyes: Negative for visual disturbance.  Respiratory: Negative for chest tightness.   Gastrointestinal: Negative for nausea and abdominal pain.  Genitourinary: Negative for frequency, difficulty urinating and vaginal pain.  Musculoskeletal: Positive for back pain and arthralgias. Negative for gait problem.  Skin: Negative for pallor.  Neurological: Positive for tremors. Negative for dizziness, weakness and numbness.  Psychiatric/Behavioral: Negative for confusion and sleep disturbance.       Objective:   Physical Exam  Constitutional: She appears well-developed and well-nourished. No distress.  HENT:  Head: Normocephalic.  Right Ear: External ear normal.  Left Ear: External ear normal.  Nose: Nose normal.  Mouth/Throat: Oropharynx is clear and moist.  Eyes: Conjunctivae normal are normal. Pupils are equal, round, and reactive to light. Right eye exhibits no discharge. Left eye exhibits no discharge.  Neck: Normal range of motion. Neck supple. No JVD present. No tracheal deviation present. No thyromegaly present.  Cardiovascular: Normal rate and normal heart sounds.        irreg irreg  Pulmonary/Chest: No stridor. No respiratory distress. She has no wheezes.   Abdominal: Soft. Bowel sounds are normal. She exhibits no distension and no mass. There is no tenderness. There is no rebound and no guarding.  Musculoskeletal: She exhibits no edema and no tenderness.  Lymphadenopathy:    She has no cervical adenopathy.  Neurological: She displays normal reflexes. No cranial nerve deficit. She exhibits normal muscle tone. Coordination normal.  Skin: No rash noted. No erythema.  Psychiatric: She has a normal mood and affect. Her behavior is normal. Judgment and thought content normal.    Lab Results  Component Value Date   WBC 3.5* 09/24/2011   HGB 13.0 09/24/2011   HCT 38.0 09/24/2011   PLT 205.0 09/24/2011   GLUCOSE 83 06/15/2012   CHOL 249* 09/24/2011   TRIG 134.0 09/24/2011   HDL 41.60 09/24/2011   LDLDIRECT 178.0 09/24/2011   LDLCALC 81 07/21/2010   ALT 17 09/24/2011   AST 23 09/24/2011   NA 139 06/15/2012   K 4.5 06/15/2012   CL 105 06/15/2012   CREATININE 0.7 06/15/2012   BUN 15 06/15/2012   CO2 28 06/15/2012   TSH 2.05 09/24/2011   INR 2.8 08/04/2012          Assessment & Plan:

## 2012-08-04 NOTE — Assessment & Plan Note (Signed)
On Lipitor qod - doing ok

## 2012-08-04 NOTE — Assessment & Plan Note (Signed)
Continue with current prescription therapy as reflected on the Med list. Coumadin

## 2012-08-04 NOTE — Assessment & Plan Note (Signed)
Continue with current prescription therapy as reflected on the Med list/PT

## 2012-08-12 ENCOUNTER — Other Ambulatory Visit (INDEPENDENT_AMBULATORY_CARE_PROVIDER_SITE_OTHER): Payer: Medicare Other

## 2012-08-12 DIAGNOSIS — G459 Transient cerebral ischemic attack, unspecified: Secondary | ICD-10-CM

## 2012-08-12 DIAGNOSIS — I4891 Unspecified atrial fibrillation: Secondary | ICD-10-CM

## 2012-08-12 DIAGNOSIS — E785 Hyperlipidemia, unspecified: Secondary | ICD-10-CM

## 2012-08-12 DIAGNOSIS — M545 Low back pain: Secondary | ICD-10-CM

## 2012-08-12 DIAGNOSIS — E538 Deficiency of other specified B group vitamins: Secondary | ICD-10-CM

## 2012-08-12 DIAGNOSIS — K219 Gastro-esophageal reflux disease without esophagitis: Secondary | ICD-10-CM

## 2012-08-12 LAB — BASIC METABOLIC PANEL
CO2: 27 mEq/L (ref 19–32)
Calcium: 9.1 mg/dL (ref 8.4–10.5)
Chloride: 106 mEq/L (ref 96–112)
Potassium: 4.4 mEq/L (ref 3.5–5.1)
Sodium: 141 mEq/L (ref 135–145)

## 2012-08-12 LAB — HEPATIC FUNCTION PANEL
AST: 35 U/L (ref 0–37)
Alkaline Phosphatase: 56 U/L (ref 39–117)
Bilirubin, Direct: 0.1 mg/dL (ref 0.0–0.3)
Total Protein: 6.6 g/dL (ref 6.0–8.3)

## 2012-09-01 ENCOUNTER — Ambulatory Visit (INDEPENDENT_AMBULATORY_CARE_PROVIDER_SITE_OTHER): Payer: Medicare Other | Admitting: Pharmacist

## 2012-09-01 DIAGNOSIS — Z7901 Long term (current) use of anticoagulants: Secondary | ICD-10-CM

## 2012-09-01 DIAGNOSIS — I4891 Unspecified atrial fibrillation: Secondary | ICD-10-CM

## 2012-09-13 ENCOUNTER — Telehealth: Payer: Self-pay | Admitting: General Practice

## 2012-09-13 NOTE — Telephone Encounter (Signed)
This patient is currently getting her coumadin checked at cardiology and she would like to switch and start coming to the [facility] location because her copay would be cheaper, is it ok for her to be monitored here?

## 2012-09-14 ENCOUNTER — Other Ambulatory Visit: Payer: Self-pay | Admitting: *Deleted

## 2012-09-15 ENCOUNTER — Other Ambulatory Visit: Payer: Self-pay | Admitting: Emergency Medicine

## 2012-09-15 DIAGNOSIS — I4891 Unspecified atrial fibrillation: Secondary | ICD-10-CM

## 2012-09-15 DIAGNOSIS — G459 Transient cerebral ischemic attack, unspecified: Secondary | ICD-10-CM

## 2012-09-15 MED ORDER — METOPROLOL TARTRATE 25 MG PO TABS: 12.5000 mg | ORAL_TABLET | Freq: Two times a day (BID) | ORAL | Status: DC

## 2012-09-15 MED ORDER — ATORVASTATIN CALCIUM 10 MG PO TABS: 10.0000 mg | ORAL_TABLET | Freq: Every day | ORAL | Status: DC

## 2012-09-15 MED ORDER — FLECAINIDE ACETATE 50 MG PO TABS: 75.0000 mg | ORAL_TABLET | Freq: Two times a day (BID) | ORAL | Status: DC

## 2012-09-16 ENCOUNTER — Other Ambulatory Visit: Payer: Self-pay | Admitting: *Deleted

## 2012-09-16 DIAGNOSIS — G459 Transient cerebral ischemic attack, unspecified: Secondary | ICD-10-CM

## 2012-09-16 DIAGNOSIS — I4891 Unspecified atrial fibrillation: Secondary | ICD-10-CM

## 2012-09-16 MED ORDER — ATORVASTATIN CALCIUM 10 MG PO TABS: 10.0000 mg | ORAL_TABLET | Freq: Every day | ORAL | Status: DC

## 2012-09-16 MED ORDER — METOPROLOL TARTRATE 25 MG PO TABS: 12.5000 mg | ORAL_TABLET | Freq: Two times a day (BID) | ORAL | Status: DC

## 2012-09-16 MED ORDER — FLECAINIDE ACETATE 50 MG PO TABS: 75.0000 mg | ORAL_TABLET | Freq: Two times a day (BID) | ORAL | Status: DC

## 2012-09-16 MED ORDER — WARFARIN SODIUM 3 MG PO TABS: ORAL_TABLET | ORAL | Status: DC

## 2012-09-16 NOTE — Telephone Encounter (Signed)
Filled her Lipitor, Metoprolol, Flecainide for 30 days at [facility] and 90 days at [facility]. She recently used [facility] and this was not changed on the system so refills were sent to wrong place originally. She also needs Warfarin, she will be getting it checked with Dr Dennison Nancy for her next visit, but has been getting it done here. I told her we could give her a 30 day supply to [facility] and then Dr Alain Marion could giver her further refills since this would get her to that appointment.

## 2012-09-19 ENCOUNTER — Other Ambulatory Visit: Payer: Self-pay | Admitting: *Deleted

## 2012-09-19 DIAGNOSIS — I4891 Unspecified atrial fibrillation: Secondary | ICD-10-CM

## 2012-09-19 DIAGNOSIS — G459 Transient cerebral ischemic attack, unspecified: Secondary | ICD-10-CM

## 2012-09-20 ENCOUNTER — Other Ambulatory Visit: Payer: Self-pay | Admitting: Cardiology

## 2012-09-20 DIAGNOSIS — I4891 Unspecified atrial fibrillation: Secondary | ICD-10-CM

## 2012-09-20 MED ORDER — ATORVASTATIN CALCIUM 10 MG PO TABS: 10.0000 mg | ORAL_TABLET | Freq: Every day | ORAL | Status: DC

## 2012-09-20 MED ORDER — FLECAINIDE ACETATE 50 MG PO TABS: 75.0000 mg | ORAL_TABLET | Freq: Two times a day (BID) | ORAL | Status: DC

## 2012-09-20 MED ORDER — METOPROLOL TARTRATE 25 MG PO TABS: 12.5000 mg | ORAL_TABLET | Freq: Two times a day (BID) | ORAL | Status: DC

## 2012-09-30 ENCOUNTER — Encounter: Payer: Self-pay | Admitting: Gynecology

## 2012-10-04 ENCOUNTER — Ambulatory Visit (INDEPENDENT_AMBULATORY_CARE_PROVIDER_SITE_OTHER): Payer: 59 | Admitting: General Practice

## 2012-10-04 DIAGNOSIS — Z7901 Long term (current) use of anticoagulants: Secondary | ICD-10-CM

## 2012-10-04 DIAGNOSIS — I4891 Unspecified atrial fibrillation: Secondary | ICD-10-CM

## 2012-10-04 LAB — POCT INR: INR: 3.7

## 2012-10-07 ENCOUNTER — Encounter: Payer: Self-pay | Admitting: Gynecology

## 2012-10-07 ENCOUNTER — Ambulatory Visit (INDEPENDENT_AMBULATORY_CARE_PROVIDER_SITE_OTHER): Payer: 59 | Admitting: Gynecology

## 2012-10-07 VITALS — BP 130/80 | Ht 63.0 in | Wt 164.0 lb

## 2012-10-07 DIAGNOSIS — M899 Disorder of bone, unspecified: Secondary | ICD-10-CM

## 2012-10-07 NOTE — Progress Notes (Signed)
Alyssa Luna October 12, 1933 [id number]        79 y.o.  G0P0 for follow up exam.  Former patient of Dr. Cherylann Banas. Several issues noted below.  Past medical history,surgical history, medications, allergies, family history and social history were all reviewed and documented in the EPIC chart. ROS:  Was performed and pertinent positives and negatives are included in the history.  Exam: Kim assistant Filed Vitals:   10/07/12 0948  BP: 130/80  Height: 5' 3"  (1.6 m)  Weight: 164 lb (74.39 kg)   General appearance  Normal Skin grossly normal Head/Neck normal with no cervical or supraclavicular adenopathy thyroid normal Lungs  clear Cardiac RR, without RMG Abdominal  soft, nontender, without masses, organomegaly or hernia Breasts  examined lying and sitting with bilateral reconstruction and implants. No masses skin changes axillary adenopathy. Pelvic  Ext/BUS/vagina  normal With atrophic changes  Adnexa  Without masses or tenderness    Anus and perineum  normal   Rectovaginal  normal sphincter tone without palpated masses or tenderness.    Assessment/Plan:  79 y.o. G0P0 female for follow up exam.   1. Osteoporosis. Patient has a long history of osteoporosis. In review of her records apparently had a T score in the -3 range. Had been on Fosamax beginning in 1996 switched over to [place] and stopped in 2011 for drug-free holiday per her history. Last DEXA 2010 T score -2.0. Recommend repeat DEXA now and she agrees to schedule this. Increase calcium vitamin D reviewed. I asked her sure Dr. Alain Marion does a vitamin D level when she sees him for blood work. 2. Atrophic vaginitis. Patient's asymptomatic and we'll continue to monitor. Without other significant symptoms of menopause. 3. History of breast cancer status post bilateral mastectomies with reconstruction. Does not get mammograms. Exam today is normal. Continue to follow. 4. Pap smear 2010. No Pap smear done today.  No history of abnormal  Pap smears previously.  Is status post hysterectomy for benign indications and over the age of 41. We'll plan stop screening now. She is comfortable with this. 5. Colonoscopy 2013. Continue with their recommended interval. 6. Health maintenance. No blood work done today as it is all done through Dr. Alain Marion office. Follow up one year, sooner as needed.    [name] MD, 11:02 AM 10/07/2012

## 2012-10-07 NOTE — Patient Instructions (Signed)
Follow up for bone density as scheduled. Otherwise follow up in 2 years for exam. Sooner if any issues.

## 2012-10-10 ENCOUNTER — Other Ambulatory Visit: Payer: Self-pay | Admitting: *Deleted

## 2012-10-10 DIAGNOSIS — G459 Transient cerebral ischemic attack, unspecified: Secondary | ICD-10-CM

## 2012-10-10 MED ORDER — WARFARIN SODIUM 3 MG PO TABS: ORAL_TABLET | ORAL | Status: DC

## 2012-10-25 ENCOUNTER — Ambulatory Visit (INDEPENDENT_AMBULATORY_CARE_PROVIDER_SITE_OTHER): Payer: 59 | Admitting: General Practice

## 2012-10-25 DIAGNOSIS — I4891 Unspecified atrial fibrillation: Secondary | ICD-10-CM

## 2012-10-25 LAB — POCT INR: INR: 3.6

## 2012-11-01 ENCOUNTER — Telehealth: Payer: Self-pay | Admitting: Diagnostic Neuroimaging

## 2012-11-01 ENCOUNTER — Ambulatory Visit (INDEPENDENT_AMBULATORY_CARE_PROVIDER_SITE_OTHER): Payer: 59 | Admitting: Family Medicine

## 2012-11-01 VITALS — BP 146/74 | HR 64 | Temp 98.2°F | Resp 16 | Ht 63.0 in | Wt 164.0 lb

## 2012-11-01 DIAGNOSIS — H109 Unspecified conjunctivitis: Secondary | ICD-10-CM

## 2012-11-01 MED ORDER — TOBRAMYCIN 0.3 % OP SOLN: 1.0000 [drp] | OPHTHALMIC | Status: DC

## 2012-11-01 NOTE — Progress Notes (Signed)
79 yo woman with 24 hours of injection and bloody pus discharge left eye without pain.  The left eye feels irritated.  O:  Left eye is injected and showing purulent discharge  A:  Bacterial conjunctivitis  Conjunctivitis - Plan: tobramycin (TOBREX) 0.3 % ophthalmic solution

## 2012-11-01 NOTE — Telephone Encounter (Signed)
Pt called and is asking about an appt relating to appt and also the need for letter of appeal for [facility] [employer] denied by insurance ($10,000) .  Please advise.

## 2012-11-01 NOTE — Patient Instructions (Addendum)
Conjunctivitis Conjunctivitis is commonly called "pink eye." Conjunctivitis can be caused by bacterial or viral infection, allergies, or injuries. There is usually redness of the lining of the eye, itching, discomfort, and sometimes discharge. There may be deposits of matter along the eyelids. A viral infection usually causes a watery discharge, while a bacterial infection causes a yellowish, thick discharge. Pink eye is very contagious and spreads by direct contact. You may be given antibiotic eyedrops as part of your treatment. Before using your eye medicine, remove all drainage from the eye by washing gently with warm water and cotton balls. Continue to use the medication until you have awakened 2 mornings in a row without discharge from the eye. Do not rub your eye. This increases the irritation and helps spread infection. Use separate towels from other household members. Wash your hands with soap and water before and after touching your eyes. Use cold compresses to reduce pain and sunglasses to relieve irritation from light. Do not wear contact lenses or wear eye makeup until the infection is gone. SEEK MEDICAL CARE IF:   Your symptoms are not better after 3 days of treatment.  You have increased pain or trouble seeing.  The outer eyelids become very red or swollen. Document Released: 09/10/2004 Document Revised: 10/26/2011 Document Reviewed: 08/03/2005 [facility] Patient Information 2013 [place].

## 2012-11-02 NOTE — Telephone Encounter (Signed)
Please get more information. Looks like it already went through pre-approval process.   Andrey Spearman, MD

## 2012-11-03 ENCOUNTER — Ambulatory Visit (INDEPENDENT_AMBULATORY_CARE_PROVIDER_SITE_OTHER): Payer: Medicare Other

## 2012-11-03 NOTE — Telephone Encounter (Signed)
I called pt this am and LMVM for her to call me back about the scan (what insurance said and [facility] ).  ssy

## 2012-11-07 NOTE — Telephone Encounter (Signed)
I called pt and finally able to connect with her.  She has letter from [facility] about denial for [facility] done at [facility] in December 2013.  I told her to bring letter to [place] tomorrow and also for her to call [employer] and see if [employer] [place] has been filed.  It sounds like needs letter of appeal (why this test was medically necessary).  Please write letter thanks.  (reasons: family history, drug induced (her hx afib), life style (farm), [place]).

## 2012-11-08 ENCOUNTER — Encounter: Payer: Self-pay | Admitting: Internal Medicine

## 2012-11-08 ENCOUNTER — Ambulatory Visit (INDEPENDENT_AMBULATORY_CARE_PROVIDER_SITE_OTHER): Payer: 59 | Admitting: Internal Medicine

## 2012-11-08 VITALS — BP 140/70 | HR 68 | Temp 97.5°F | Resp 16 | Wt 161.0 lb

## 2012-11-08 DIAGNOSIS — R251 Tremor, unspecified: Secondary | ICD-10-CM

## 2012-11-08 DIAGNOSIS — E559 Vitamin D deficiency, unspecified: Secondary | ICD-10-CM

## 2012-11-08 DIAGNOSIS — K117 Disturbances of salivary secretion: Secondary | ICD-10-CM

## 2012-11-08 DIAGNOSIS — M1990 Unspecified osteoarthritis, unspecified site: Secondary | ICD-10-CM

## 2012-11-08 DIAGNOSIS — R259 Unspecified abnormal involuntary movements: Secondary | ICD-10-CM

## 2012-11-08 DIAGNOSIS — G459 Transient cerebral ischemic attack, unspecified: Secondary | ICD-10-CM

## 2012-11-08 DIAGNOSIS — I4891 Unspecified atrial fibrillation: Secondary | ICD-10-CM

## 2012-11-08 DIAGNOSIS — R682 Dry mouth, unspecified: Secondary | ICD-10-CM

## 2012-11-08 DIAGNOSIS — E538 Deficiency of other specified B group vitamins: Secondary | ICD-10-CM

## 2012-11-08 MED ORDER — WARFARIN SODIUM 3 MG PO TABS: 6.0000 mg | ORAL_TABLET | Freq: Every day | ORAL | Status: DC

## 2012-11-08 NOTE — Assessment & Plan Note (Signed)
Continue with current prescription therapy as reflected on the Med list.  

## 2012-11-08 NOTE — Telephone Encounter (Signed)
Pt came in and brought the [employer] EOB with her and she received the same information from [place] (she spoke with yesterday) that her responsibility is $17.69.  She still wants to make sure she does not need to appeal.  I instructed her to call [facility] (and ask if they have any denials) and then also call [employer].  She will contact us again if needed.

## 2012-11-08 NOTE — Assessment & Plan Note (Signed)
Dr Leta Baptist

## 2012-11-08 NOTE — Assessment & Plan Note (Signed)
no change

## 2012-11-08 NOTE — Assessment & Plan Note (Signed)
NM Brain SPECT - neg for Parkinson's

## 2012-11-08 NOTE — Progress Notes (Signed)
   Subjective:    HPI   F/u on dry mouth x long time. F/u on tremor - neg NM Brain SPECT for Parkinson's  - Dr Leta Baptist The patient presents for a follow-up of  chronic hypertension, chronic dyslipidemia, A fib, tremor. She is taking Lipitor 1/2 tab qd  Her HR, BPs - OK at home  BP Readings from Last 3 Encounters:  11/08/12 140/70  11/01/12 146/74  10/07/12 130/80   Wt Readings from Last 3 Encounters:  11/08/12 161 lb (73.029 kg)  11/01/12 164 lb (74.39 kg)  10/07/12 164 lb (74.39 kg)      Review of Systems  Constitutional: Positive for fatigue. Negative for activity change, appetite change and unexpected weight change.  HENT: Negative for congestion, mouth sores and sinus pressure.   Eyes: Negative for visual disturbance.  Respiratory: Negative for chest tightness.   Gastrointestinal: Negative for nausea and abdominal pain.  Genitourinary: Negative for frequency, difficulty urinating and vaginal pain.  Musculoskeletal: Positive for back pain and arthralgias. Negative for gait problem.  Skin: Negative for pallor.  Neurological: Positive for tremors. Negative for dizziness, weakness and numbness.  Psychiatric/Behavioral: Negative for confusion and sleep disturbance.       Objective:   Physical Exam  Constitutional: She appears well-developed and well-nourished. No distress.  HENT:  Head: Normocephalic.  Right Ear: External ear normal.  Left Ear: External ear normal.  Nose: Nose normal.  Mouth/Throat: Oropharynx is clear and moist.  Eyes: Conjunctivae are normal. Pupils are equal, round, and reactive to light. Right eye exhibits no discharge. Left eye exhibits no discharge.  Neck: Normal range of motion. Neck supple. No JVD present. No tracheal deviation present. No thyromegaly present.  Cardiovascular: Normal rate and normal heart sounds.   irreg irreg  Pulmonary/Chest: No stridor. No respiratory distress. She has no wheezes.  Abdominal: Soft. Bowel sounds are  normal. She exhibits no distension and no mass. There is no tenderness. There is no rebound and no guarding.  Musculoskeletal: She exhibits no edema and no tenderness.  Lymphadenopathy:    She has no cervical adenopathy.  Neurological: She displays normal reflexes. No cranial nerve deficit. She exhibits normal muscle tone. Coordination normal.  Skin: No rash noted. No erythema.  Psychiatric: She has a normal mood and affect. Her behavior is normal. Judgment and thought content normal.  mild tremor  Lab Results  Component Value Date   WBC 3.5* 09/24/2011   HGB 13.0 09/24/2011   HCT 38.0 09/24/2011   PLT 205.0 09/24/2011   GLUCOSE 84 08/12/2012   CHOL 249* 09/24/2011   TRIG 134.0 09/24/2011   HDL 41.60 09/24/2011   LDLDIRECT 178.0 09/24/2011   LDLCALC 81 07/21/2010   ALT 27 08/12/2012   AST 35 08/12/2012   NA 141 08/12/2012   K 4.4 08/12/2012   CL 106 08/12/2012   CREATININE 0.7 08/12/2012   BUN 15 08/12/2012   CO2 27 08/12/2012   TSH 2.99 08/12/2012   INR 3.6 10/25/2012          Assessment & Plan:

## 2012-11-08 NOTE — Assessment & Plan Note (Signed)
Doing well 

## 2012-11-11 ENCOUNTER — Ambulatory Visit (INDEPENDENT_AMBULATORY_CARE_PROVIDER_SITE_OTHER): Payer: 59 | Admitting: General Practice

## 2012-11-11 ENCOUNTER — Other Ambulatory Visit (INDEPENDENT_AMBULATORY_CARE_PROVIDER_SITE_OTHER): Payer: 59

## 2012-11-11 DIAGNOSIS — K117 Disturbances of salivary secretion: Secondary | ICD-10-CM

## 2012-11-11 DIAGNOSIS — I4891 Unspecified atrial fibrillation: Secondary | ICD-10-CM

## 2012-11-11 DIAGNOSIS — E559 Vitamin D deficiency, unspecified: Secondary | ICD-10-CM

## 2012-11-11 DIAGNOSIS — G459 Transient cerebral ischemic attack, unspecified: Secondary | ICD-10-CM

## 2012-11-11 DIAGNOSIS — R259 Unspecified abnormal involuntary movements: Secondary | ICD-10-CM

## 2012-11-11 DIAGNOSIS — E785 Hyperlipidemia, unspecified: Secondary | ICD-10-CM

## 2012-11-11 DIAGNOSIS — R251 Tremor, unspecified: Secondary | ICD-10-CM

## 2012-11-11 DIAGNOSIS — M1990 Unspecified osteoarthritis, unspecified site: Secondary | ICD-10-CM

## 2012-11-11 DIAGNOSIS — R682 Dry mouth, unspecified: Secondary | ICD-10-CM

## 2012-11-11 DIAGNOSIS — Z7901 Long term (current) use of anticoagulants: Secondary | ICD-10-CM

## 2012-11-11 DIAGNOSIS — E538 Deficiency of other specified B group vitamins: Secondary | ICD-10-CM

## 2012-11-11 LAB — URINALYSIS, ROUTINE W REFLEX MICROSCOPIC
Bilirubin Urine: NEGATIVE
Ketones, ur: NEGATIVE
Specific Gravity, Urine: 1.015 (ref 1.000–1.030)
Urobilinogen, UA: 0.2 (ref 0.0–1.0)

## 2012-11-11 LAB — CBC WITH DIFFERENTIAL/PLATELET
Basophils Absolute: 0 10*3/uL (ref 0.0–0.1)
Hemoglobin: 13.8 g/dL (ref 12.0–15.0)
Lymphocytes Relative: 49 % — ABNORMAL HIGH (ref 12.0–46.0)
Monocytes Relative: 10.2 % (ref 3.0–12.0)
Neutro Abs: 1.1 10*3/uL — ABNORMAL LOW (ref 1.4–7.7)
Neutrophils Relative %: 36.2 % — ABNORMAL LOW (ref 43.0–77.0)
RBC: 4.67 Mil/uL (ref 3.87–5.11)
RDW: 13.4 % (ref 11.5–14.6)

## 2012-11-11 LAB — POCT INR: INR: 2.5

## 2012-11-11 LAB — HEPATIC FUNCTION PANEL
Albumin: 3.8 g/dL (ref 3.5–5.2)
Total Protein: 6.8 g/dL (ref 6.0–8.3)

## 2012-11-11 LAB — BASIC METABOLIC PANEL
Calcium: 9 mg/dL (ref 8.4–10.5)
GFR: 80.85 mL/min (ref >60.00)
Glucose, Bld: 82 mg/dL (ref 70–99)
Sodium: 138 mEq/L (ref 135–145)

## 2012-11-11 LAB — LIPID PANEL
Cholesterol: 163 mg/dL (ref 0–200)
LDL Cholesterol: 102 mg/dL — ABNORMAL HIGH (ref 0–99)
Triglycerides: 124 mg/dL (ref 0.0–149.0)
VLDL: 24.8 mg/dL (ref 0.0–40.0)

## 2012-11-12 LAB — VITAMIN D 25 HYDROXY (VIT D DEFICIENCY, FRACTURES): Vit D, 25-Hydroxy: 45 ng/mL (ref 30–89)

## 2012-11-14 ENCOUNTER — Telehealth: Payer: Self-pay | Admitting: *Deleted

## 2012-11-14 NOTE — Telephone Encounter (Signed)
Left message for pt to call.

## 2012-11-14 NOTE — Telephone Encounter (Signed)
Message copied by Thamas Jaegers on Mon Nov 14, 2012  9:14 AM ------      Message from: [name]      Created: Mon Nov 14, 2012  8:59 AM       Recommend office visit to discuss bone density study results ------

## 2012-11-18 NOTE — Telephone Encounter (Signed)
Pt informed with the below, transferred to front desk.

## 2012-11-24 ENCOUNTER — Ambulatory Visit: Payer: Medicare Other | Admitting: Internal Medicine

## 2012-11-24 ENCOUNTER — Encounter: Payer: Self-pay | Admitting: Gynecology

## 2012-11-24 ENCOUNTER — Ambulatory Visit (INDEPENDENT_AMBULATORY_CARE_PROVIDER_SITE_OTHER): Payer: Medicare Other | Admitting: Gynecology

## 2012-11-24 DIAGNOSIS — M810 Age-related osteoporosis without current pathological fracture: Secondary | ICD-10-CM

## 2012-11-24 NOTE — Patient Instructions (Signed)
We'll plan on repeating the bone density in 2 years. Increase weight-bearing exercise.

## 2012-11-24 NOTE — Progress Notes (Signed)
Patient presents to discuss her most recent DEXA. Results show stability and overall improvement compared to her last bone density 2010 at her hips. Her forearm does show distal third T score  -2.5.  Was a -2.1 previously.  Had been on Fosamax beginning in 1996 and then ultimately switched over to [place] and stopped it in 2011 for a free holiday.  I reviewed the situation where she does have osteoporosis in her forearm but her hips actually looked better. Spine is not done due to degenerative changes. After lengthy discussion of risks and benefits/pros and cons  We decided to hold on treatment other than increasing weight bearing exercise. She does take calcium her last vitamin D level was 45 in March 2014. We'll repeat her DEXA in 2 years and then go from there. Alternatives would be to reinitiate treatment particularly for her forearm but at this point she declines and I certainly think this is a reasonable decision.

## 2012-11-28 ENCOUNTER — Telehealth: Payer: Self-pay

## 2012-11-28 MED ORDER — TOBRAMYCIN-DEXAMETHASONE 0.3-0.1 % OP SUSP: 1.0000 [drp] | OPHTHALMIC | Status: DC

## 2012-11-28 NOTE — Telephone Encounter (Signed)
Please advise. Pended tobradex, this is what DrL gave last month.

## 2012-11-28 NOTE — Telephone Encounter (Signed)
Rx sent to pharmacy   

## 2012-11-28 NOTE — Telephone Encounter (Signed)
Notified pt that Rx was sent in and advised her to RTC if it doesn't start to improve w/in 2-3 days. Pt agreed.

## 2012-11-28 NOTE — Telephone Encounter (Signed)
I'm sure Dr. Carlean Jews would say yes

## 2012-11-28 NOTE — Telephone Encounter (Signed)
PATIENT STATES THAT SHE WAS SEEN FOR PINK EYE AND NOW HAS IT IN HER OTHER EYE. WOULD LIKE TO GET ANOTHER RX TO TREAT THIS. PATIENT USES [facility] IN [place]. PLEASE CALL AT [phone].

## 2012-12-06 ENCOUNTER — Ambulatory Visit (INDEPENDENT_AMBULATORY_CARE_PROVIDER_SITE_OTHER): Payer: 59 | Admitting: General Practice

## 2012-12-06 DIAGNOSIS — Z7901 Long term (current) use of anticoagulants: Secondary | ICD-10-CM

## 2012-12-06 DIAGNOSIS — I4891 Unspecified atrial fibrillation: Secondary | ICD-10-CM

## 2012-12-07 ENCOUNTER — Ambulatory Visit (INDEPENDENT_AMBULATORY_CARE_PROVIDER_SITE_OTHER): Payer: 59 | Admitting: Internal Medicine

## 2012-12-07 ENCOUNTER — Encounter: Payer: Self-pay | Admitting: Internal Medicine

## 2012-12-07 VITALS — BP 149/71 | HR 47 | Ht 65.5 in | Wt 163.4 lb

## 2012-12-07 DIAGNOSIS — R55 Syncope and collapse: Secondary | ICD-10-CM

## 2012-12-07 DIAGNOSIS — I4891 Unspecified atrial fibrillation: Secondary | ICD-10-CM

## 2012-12-07 NOTE — Assessment & Plan Note (Signed)
She has had no recurrent episodes. She will undergo watchful waiting.

## 2012-12-07 NOTE — Progress Notes (Signed)
HPI Alyssa Luna returns today for followup. She is a 79 yo woman with PAF, HTN, and dizziness. She has not had any additional symptomatic atrial fibrillationin the interim. She was recently evaluated for Parkinson's disease and was found to have an essential tremor instead. She is quite anxious and has many questions. She denies chest pain or shortness of breath. Minimal peripheral edema. She has a log of her heart rate and blood pressure. This demonstrates some bradycardia. It also demonstrates some hypertension. Overall however, her heart rate and blood pressure have been well-controlled. Allergies  Allergen Reactions  . Ciprofloxacin     GI upset   . Simvastatin     REACTION: leg cramps, weakness     Current Outpatient Prescriptions  Medication Sig Dispense Refill  . acetaminophen (TYLENOL) 500 MG tablet Take 500 mg by mouth as needed.        [name] aspirin 81 MG EC tablet Take 81 mg by mouth daily.        [name] atorvastatin (LIPITOR) 10 MG tablet Take 1 tablet (10 mg total) by mouth daily.  90 tablet  2  . Cholecalciferol 1000 UNITS tablet Take 1,000 Units by mouth daily.        . Cyanocobalamin (VITAMIN B-12 CR) 1000 MCG TBCR Take by mouth daily.        . flecainide (TAMBOCOR) 50 MG tablet Take 1.5 tablets (75 mg total) by mouth 2 (two) times daily.  270 tablet  2  . metoprolol tartrate (LOPRESSOR) 25 MG tablet Take 0.5 tablets (12.5 mg total) by mouth 2 (two) times daily.  90 tablet  2  . omeprazole (PRILOSEC) 20 MG capsule Take 20 mg by mouth daily.        [name] tobramycin-dexamethasone (TOBRADEX) ophthalmic solution Place 1 drop into both eyes every 4 (four) hours while awake.  5 mL  0  . warfarin (COUMADIN) 3 MG tablet Take 2 tablets (6 mg total) by mouth at bedtime. Take as directed by the coumadin clinic.  180 tablet  3   No current facility-administered medications for this visit.     Past Medical History  Diagnosis Date  . Colon polyps   . Diverticulosis of colon   . GERD  (gastroesophageal reflux disease)   . Osteoarthritis   . Osteoporosis   . TR (tricuspid regurgitation)     Mild  . Vitamin B12 deficiency   . Vitamin D deficiency   . History of breast cancer   . Hyperlipidemia   . Familial tremor   . LBP (low back pain)   . Cataract   . Macular degeneration   . Atrial fibrillation     D Taylor  . TIA (transient ischemic attack)   . Breast cancer   . Hemorrhoids     ROS:   All systems reviewed and negative except as noted in the HPI.   Past Surgical History  Procedure Laterality Date  . Breast enhancement surgery    . Cataract extraction    . Total knee arthroplasty  Dec 2011    Right - Dr Noemi Chapel  . Bilateral mastectomy    . Polypectomy    . Colonoscopy    . Vaginal hysterectomy      [facility] BSO  . Oophorectomy      BSO  . Breast surgery      Bilateral mastectomy  . Augmentation mammaplasty       Family History  Problem Relation Age of Onset  . Early death Neg Hx   .  Stroke Neg Hx   . Breast cancer Mother 23  . Tremor Mother   . Tremor Brother   . Colon cancer Maternal Aunt   . Ovarian cancer Maternal Grandmother      History   Social History  . Marital Status: Married    Spouse Name: N/A    Number of Children: N/A  . Years of Education: N/A   Occupational History  . [employer], Retired    Social History Main Topics  . Smoking status: Never Smoker   . Smokeless tobacco: Not on file  . Alcohol Use: No  . Drug Use: No  . Sexually Active: No     Comment: HYST   Other Topics Concern  . Not on file   Social History Narrative   Regular Exercise -  NO           BP 149/71  Pulse 47  Ht 5' 5.5" (1.664 m)  Wt 163 lb 6.4 oz (74.118 kg)  BMI 26.77 kg/m2  Physical Exam:  Well appearing 79 year old woman,NAD HEENT: Unremarkable Neck:  No JVD, no thyromegally Back:  No CVA tenderness Lungs:  Clear with no wheezes, rales, or rhonchi. HEART:  Regular rate rhythm, no murmurs, no rubs, no clicks Abd:  soft,  positive bowel sounds, no organomegally, no rebound, no guarding Ext:  2 plus pulses, no edema, no cyanosis, no clubbing Skin:  No rashes no nodules Neuro:  CN II through XII intact, motor grossly intact  EKG - sinus bradycardia with right bundle branch block  Assess/Plan:

## 2012-12-07 NOTE — Patient Instructions (Addendum)
Your physician wants you to follow-up in: 6 months with Dr Taylor You will receive a reminder letter in the mail two months in advance. If you don't receive a letter, please call our office to schedule the follow-up appointment.  

## 2012-12-07 NOTE — Assessment & Plan Note (Signed)
She is maintaining sinus rhythm on low-dose flecainide. She will continue her current medical therapy.

## 2012-12-14 ENCOUNTER — Encounter: Payer: Self-pay | Admitting: Diagnostic Neuroimaging

## 2012-12-14 ENCOUNTER — Ambulatory Visit (INDEPENDENT_AMBULATORY_CARE_PROVIDER_SITE_OTHER): Payer: Medicare Other | Admitting: Diagnostic Neuroimaging

## 2012-12-14 VITALS — BP 148/72 | HR 64 | Temp 97.5°F | Ht 64.0 in | Wt 164.0 lb

## 2012-12-14 DIAGNOSIS — G250 Essential tremor: Secondary | ICD-10-CM

## 2012-12-14 DIAGNOSIS — G252 Other specified forms of tremor: Secondary | ICD-10-CM

## 2012-12-14 NOTE — Progress Notes (Signed)
[facility]  PATIENT: Alyssa Luna DOB: 05-15-1934  REFERRING CLINICIAN:  HISTORY FROM: patient REASON FOR VISIT: follow up   HISTORICAL  CHIEF COMPLAINT:  Chief Complaint  Patient presents with  . Dizziness    HISTORY OF PRESENT ILLNESS:  UPDATE 12/14/12: Since last visit, tremor is stable. DATscan was negative. Patient continues to have intermittent positional vertigo, when she lays down at night to go to sleep. Symptoms present for many years and previously she was told she had benign positional vertigo. Patient also has intermittent episodes of lightheadedness and dizziness when she stands up from sitting position after long time. The symptoms  like this in the office today. UPDATE 06/15/12: Doing about the same.Tremor and gait stable. No progression of sxs.  UPDATE 10/21/11: Doing about 79 the same. Still with postural tremor (left > right) hands. Combing hair with left hand is difficult. Gait and balance ok.  PRIOR HPI (05/29/11): 81 year odl right-handed female with history of hypercholesterolemia, a lesions, breast cancer, here for evaluation of tremor.  Patient reports 3 years history of progressive left hand tremor, when she holds a plate or papers.  Next is developing mild tremor in her right hand as well. She denies resting tremor. Denies sleep disturbance, vivid dreams, loss of smell or taste, constipation. She has strong family history of tremor in her maternal grandfather, mother and brother. Her brother reports improvement of tremor with mild alcohol use. Patient has not tried this. Her mother was diagnosed with drug-induced parkinsonism related to haldol.  REVIEW OF SYSTEMS: Full 14 system review of systems performed and notable only for weight gain spinning sensation urinary incontinence easy bruising easy bleeding increased thirst joint pain anxiety decreased energy dizziness tremor weakness memory loss.  ALLERGIES: Allergies  Allergen Reactions    . Ciprofloxacin     GI upset   . Simvastatin     REACTION: leg cramps, weakness    HOME MEDICATIONS: Outpatient Prescriptions Prior to Visit  Medication Sig Dispense Refill  . acetaminophen (TYLENOL) 500 MG tablet Take 500 mg by mouth as needed.        [name] aspirin 81 MG EC tablet Take 81 mg by mouth daily.        [name] atorvastatin (LIPITOR) 10 MG tablet Take 1 tablet (10 mg total) by mouth daily.  90 tablet  2  . Cholecalciferol 1000 UNITS tablet Take 1,000 Units by mouth daily.        . Cyanocobalamin (VITAMIN B-12 CR) 1000 MCG TBCR Take by mouth daily.        . flecainide (TAMBOCOR) 50 MG tablet Take 1.5 tablets (75 mg total) by mouth 2 (two) times daily.  270 tablet  2  . metoprolol tartrate (LOPRESSOR) 25 MG tablet Take 0.5 tablets (12.5 mg total) by mouth 2 (two) times daily.  90 tablet  2  . omeprazole (PRILOSEC) 20 MG capsule Take 20 mg by mouth daily.        [name] warfarin (COUMADIN) 3 MG tablet Take 2 tablets (6 mg total) by mouth at bedtime. Take as directed by the coumadin clinic.  180 tablet  3  . tobramycin-dexamethasone (TOBRADEX) ophthalmic solution Place 1 drop into both eyes every 4 (four) hours while awake.  5 mL  0   No facility-administered medications prior to visit.    PAST MEDICAL HISTORY: Past Medical History  Diagnosis Date  . Colon polyps   . Diverticulosis of colon   . GERD (gastroesophageal reflux disease)   .  Osteoarthritis   . Osteoporosis   . TR (tricuspid regurgitation)     Mild  . Vitamin B12 deficiency   . Vitamin D deficiency   . History of breast cancer   . Hyperlipidemia   . Familial tremor   . LBP (low back pain)   . Cataract   . Macular degeneration   . Atrial fibrillation     D Taylor  . TIA (transient ischemic attack)   . Breast cancer   . Hemorrhoids     PAST SURGICAL HISTORY: Past Surgical History  Procedure Laterality Date  . Breast enhancement surgery    . Cataract extraction    . Total knee arthroplasty  Dec 2011    Right -  Dr Noemi Chapel  . Bilateral mastectomy    . Polypectomy    . Colonoscopy    . Vaginal hysterectomy      LAVH BSO  . Oophorectomy      BSO  . Breast surgery      Bilateral mastectomy  . Augmentation mammaplasty      FAMILY HISTORY: Family History  Problem Relation Age of Onset  . Early death Neg Hx   . Stroke Neg Hx   . Breast cancer Mother 22  . Tremor Mother   . Tremor Brother   . Colon cancer Maternal Aunt   . Ovarian cancer Maternal Grandmother     SOCIAL HISTORY:  History   Social History  . Marital Status: Married    Spouse Name: N/A    Number of Children: N/A  . Years of Education: N/A   Occupational History  . [employer], Retired    Social History Main Topics  . Smoking status: Never Smoker   . Smokeless tobacco: Not on file  . Alcohol Use: No  . Drug Use: No  . Sexually Active: No     Comment: HYST   Other Topics Concern  . Not on file   Social History Narrative   Regular Exercise -  NO           PHYSICAL EXAM  Filed Vitals:   12/14/12 1415 12/14/12 1416  BP: 118/69 148/72  Pulse: 64 64  Temp: 97.5 F (36.4 C)   TempSrc: Oral   Height: 5' 4"  (1.626 m)   Weight: 164 lb (74.39 kg)    Body mass index is 28.14 kg/(m^2).  EXAM: General: Patient is awake, alert and in no acute distress.  Well developed and groomed.  NEG MYERSON'S.   Neck: Neck is supple. Cardiovascular: No carotid artery bruits.  Heart is regular rate and rhythm with no murmurs.  Neurologic Exam  Mental Status: Awake, alert.  Language is fluent and comprehension intact. Cranial Nerves: No evidence of papilledema on funduscopic exam.  Pupils are equal and reactive to light.  Visual fields are full to confrontation.  Conjugate eye movements are full and symmetric.  Facial sensation and strength are symmetric.  Hearing is intact.  Palate elevated symmetrically and uvula is midline.  Shoulder shrug is symmetric.  Tongue is midline. Motor: Normal bulk.  MINIMAL POSTURAL TREMOR OF BUE.   NO COGWHEELING AT BASELINE; NO BRADYKINESIA. NO REST TREMOR. Full strength in the upper and lower extremities.  No pronator drift. Sensory: Intact and symmetric to light touch. Coordination: No ataxia or dysmetria on finger-nose or rapid alternating movement testing. Gait and Station: Narrow based gait.  Romberg is negative.  Reflexes: BUE 1, RIGHT KNEE TRACE, LEFT KNEE 1, ANKLES ABSENT.   DIAGNOSTIC DATA (LABS,  IMAGING, TESTING) - I reviewed patient records, labs, notes, testing and imaging myself where available.  Lab Results  Component Value Date   WBC 3.0* 11/11/2012   HGB 13.8 11/11/2012   HCT 40.6 11/11/2012   MCV 86.9 11/11/2012   PLT 237.0 11/11/2012      Component Value Date/Time   NA 138 11/11/2012 0835   K 4.1 11/11/2012 0835   CL 104 11/11/2012 0835   CO2 28 11/11/2012 0835   GLUCOSE 82 11/11/2012 0835   BUN 16 11/11/2012 0835   CREATININE 0.7 11/11/2012 0835   CALCIUM 9.0 11/11/2012 0835   PROT 6.8 11/11/2012 0835   ALBUMIN 3.8 11/11/2012 0835   AST 35 11/11/2012 0835   ALT 28 11/11/2012 0835   ALKPHOS 68 11/11/2012 0835   BILITOT 0.8 11/11/2012 0835   GFRNONAA >60 09/02/2010 1550   GFRAA  Value: >60        The eGFR has been calculated using the MDRD equation. This calculation has not been validated in all clinical situations. eGFR's persistently <60 mL/min signify possible Chronic Kidney Disease. 09/02/2010 1550   Lab Results  Component Value Date   CHOL 163 11/11/2012   HDL 35.90* 11/11/2012   LDLCALC 102* 11/11/2012   LDLDIRECT 178.0 09/24/2011   TRIG 124.0 11/11/2012   CHOLHDL 5 11/11/2012   No results found for this basename: HGBA1C   Lab Results  Component Value Date   VITAMINB12 >1500* 11/11/2012   Lab Results  Component Value Date   TSH 2.84 11/11/2012    08/03/12 DATscan - normal   ASSESSMENT AND PLAN  79 y.o. year old female  has a past medical history of Colon polyps; Diverticulosis of colon; GERD (gastroesophageal reflux disease); Osteoarthritis;  Osteoporosis; TR (tricuspid regurgitation); Vitamin B12 deficiency; Vitamin D deficiency; History of breast cancer; Hyperlipidemia; Familial tremor; LBP (low back pain); Cataract; Macular degeneration; Atrial fibrillation; TIA (transient ischemic attack); Breast cancer; and Hemorrhoids. here with postural tremor since 2009. Likely represents essential tremor.  PLAN: 1. Observation; if tremor or significant worsens can consider primidone   Penni Bombard, MD 7/74/1287, 8:67 PM Certified in Neurology, Neurophysiology and Neuroimaging  [facility] [address], [place], [place] [address] [phone]

## 2012-12-14 NOTE — Patient Instructions (Signed)
Resume activities as tolerated.  Consider primidone in future if tremor worsens.

## 2013-01-03 ENCOUNTER — Telehealth: Payer: Self-pay | Admitting: *Deleted

## 2013-01-03 ENCOUNTER — Ambulatory Visit (INDEPENDENT_AMBULATORY_CARE_PROVIDER_SITE_OTHER): Payer: 59 | Admitting: General Practice

## 2013-01-03 DIAGNOSIS — I4891 Unspecified atrial fibrillation: Secondary | ICD-10-CM

## 2013-01-03 DIAGNOSIS — Z7901 Long term (current) use of anticoagulants: Secondary | ICD-10-CM

## 2013-01-03 NOTE — Telephone Encounter (Signed)
Pt c/o fatigue and unexplained weight gain. She is here today to have Coumadin check. She wants to have her thyroid labs checked. I informed her she just had it checked 11/11/12 and it was normal. She understands and wants to wait to have it checked.

## 2013-02-08 ENCOUNTER — Ambulatory Visit: Payer: 59 | Admitting: Internal Medicine

## 2013-02-14 ENCOUNTER — Ambulatory Visit (INDEPENDENT_AMBULATORY_CARE_PROVIDER_SITE_OTHER): Payer: 59 | Admitting: Internal Medicine

## 2013-02-14 ENCOUNTER — Ambulatory Visit (INDEPENDENT_AMBULATORY_CARE_PROVIDER_SITE_OTHER): Payer: 59 | Admitting: General Practice

## 2013-02-14 ENCOUNTER — Encounter: Payer: Self-pay | Admitting: Internal Medicine

## 2013-02-14 VITALS — BP 130/80 | HR 76 | Temp 97.0°F | Resp 16 | Wt 162.0 lb

## 2013-02-14 DIAGNOSIS — E559 Vitamin D deficiency, unspecified: Secondary | ICD-10-CM

## 2013-02-14 DIAGNOSIS — I4891 Unspecified atrial fibrillation: Secondary | ICD-10-CM

## 2013-02-14 DIAGNOSIS — E538 Deficiency of other specified B group vitamins: Secondary | ICD-10-CM

## 2013-02-14 DIAGNOSIS — M545 Low back pain, unspecified: Secondary | ICD-10-CM

## 2013-02-14 DIAGNOSIS — G459 Transient cerebral ischemic attack, unspecified: Secondary | ICD-10-CM

## 2013-02-14 DIAGNOSIS — Z7901 Long term (current) use of anticoagulants: Secondary | ICD-10-CM

## 2013-02-14 LAB — POCT INR: INR: 2.8

## 2013-02-14 NOTE — Assessment & Plan Note (Signed)
No relapse on rx

## 2013-02-14 NOTE — Assessment & Plan Note (Signed)
Continue with current prescription therapy as reflected on the Med list.  

## 2013-02-14 NOTE — Assessment & Plan Note (Signed)
On rx

## 2013-02-14 NOTE — Assessment & Plan Note (Signed)
Chronic sx's Continue with current prescription therapy as reflected on the Med list.

## 2013-02-14 NOTE — Progress Notes (Signed)
   Subjective:    HPI   F/u on dry mouth x long time. Feeling better on less sugar. F/u on tremor - neg NM Brain SPECT for Parkinson's  - Dr Leta Baptist The patient presents for a follow-up of  chronic hypertension, chronic dyslipidemia, A fib, tremor. She is taking Lipitor 1/2 tab qd  Her HR, BPs - OK at home  BP Readings from Last 3 Encounters:  02/14/13 130/80  12/14/12 148/72  12/07/12 149/71   Wt Readings from Last 3 Encounters:  02/14/13 162 lb (73.483 kg)  12/14/12 164 lb (74.39 kg)  12/07/12 163 lb 6.4 oz (74.118 kg)      Review of Systems  Constitutional: Positive for fatigue. Negative for activity change, appetite change and unexpected weight change.  HENT: Negative for congestion, mouth sores and sinus pressure.   Eyes: Negative for visual disturbance.  Respiratory: Negative for chest tightness.   Gastrointestinal: Negative for nausea and abdominal pain.  Genitourinary: Negative for frequency, difficulty urinating and vaginal pain.  Musculoskeletal: Positive for back pain and arthralgias. Negative for gait problem.  Skin: Negative for pallor.  Neurological: Positive for tremors. Negative for dizziness, weakness and numbness.  Psychiatric/Behavioral: Negative for confusion and sleep disturbance.       Objective:   Physical Exam  Constitutional: She appears well-developed and well-nourished. No distress.  HENT:  Head: Normocephalic.  Right Ear: External ear normal.  Left Ear: External ear normal.  Nose: Nose normal.  Mouth/Throat: Oropharynx is clear and moist.  Eyes: Conjunctivae are normal. Pupils are equal, round, and reactive to light. Right eye exhibits no discharge. Left eye exhibits no discharge.  Neck: Normal range of motion. Neck supple. No JVD present. No tracheal deviation present. No thyromegaly present.  Cardiovascular: Normal rate and normal heart sounds.   irreg irreg  Pulmonary/Chest: No stridor. No respiratory distress. She has no wheezes.   Abdominal: Soft. Bowel sounds are normal. She exhibits no distension and no mass. There is no tenderness. There is no rebound and no guarding.  Musculoskeletal: She exhibits no edema and no tenderness.  Lymphadenopathy:    She has no cervical adenopathy.  Neurological: She displays normal reflexes. No cranial nerve deficit. She exhibits normal muscle tone. Coordination normal.  Skin: No rash noted. No erythema.  Psychiatric: She has a normal mood and affect. Her behavior is normal. Judgment and thought content normal.  mild tremor  Lab Results  Component Value Date   WBC 3.0* 11/11/2012   HGB 13.8 11/11/2012   HCT 40.6 11/11/2012   PLT 237.0 11/11/2012   GLUCOSE 82 11/11/2012   CHOL 163 11/11/2012   TRIG 124.0 11/11/2012   HDL 35.90* 11/11/2012   LDLDIRECT 178.0 09/24/2011   LDLCALC 102* 11/11/2012   ALT 28 11/11/2012   AST 35 11/11/2012   NA 138 11/11/2012   K 4.1 11/11/2012   CL 104 11/11/2012   CREATININE 0.7 11/11/2012   BUN 16 11/11/2012   CO2 28 11/11/2012   TSH 2.84 11/11/2012   INR 2.3 01/03/2013          Assessment & Plan:

## 2013-02-15 ENCOUNTER — Ambulatory Visit: Payer: 59 | Admitting: Internal Medicine

## 2013-03-28 ENCOUNTER — Ambulatory Visit (INDEPENDENT_AMBULATORY_CARE_PROVIDER_SITE_OTHER): Payer: 59 | Admitting: General Practice

## 2013-03-28 DIAGNOSIS — I4891 Unspecified atrial fibrillation: Secondary | ICD-10-CM

## 2013-03-28 DIAGNOSIS — Z7901 Long term (current) use of anticoagulants: Secondary | ICD-10-CM

## 2013-03-28 LAB — POCT INR: INR: 3

## 2013-04-21 ENCOUNTER — Encounter (HOSPITAL_COMMUNITY): Payer: Self-pay | Admitting: *Deleted

## 2013-04-21 ENCOUNTER — Emergency Department (HOSPITAL_COMMUNITY)
Admission: EM | Admit: 2013-04-21 | Discharge: 2013-04-21 | Disposition: A | Discharge status: Home / Self Care | Payer: 59 | Source: Home / Self Care | Attending: Emergency Medicine | Admitting: Emergency Medicine

## 2013-04-21 ENCOUNTER — Emergency Department (INDEPENDENT_AMBULATORY_CARE_PROVIDER_SITE_OTHER): Payer: 59

## 2013-04-21 DIAGNOSIS — S90129A Contusion of unspecified lesser toe(s) without damage to nail, initial encounter: Secondary | ICD-10-CM

## 2013-04-21 DIAGNOSIS — S90112A Contusion of left great toe without damage to nail, initial encounter: Secondary | ICD-10-CM

## 2013-04-21 LAB — PROTIME-INR: Prothrombin Time: 26.9 seconds — ABNORMAL HIGH (ref 11.6–15.2)

## 2013-04-21 NOTE — ED Provider Notes (Signed)
CSN: [id number]     Arrival date & time 04/21/13  1914 History   First MD Initiated Contact with Patient 04/21/13 2015     Chief Complaint  Patient presents with  . Toe Injury   (Consider location/radiation/quality/duration/timing/severity/associated sxs/prior Treatment) Patient is a 79 y.o. female presenting with toe pain. The history is provided by the patient. No language interpreter was used.  Toe Pain This is a new problem. The problem occurs constantly. The problem has been gradually worsening. Nothing aggravates the symptoms. Nothing relieves the symptoms. She has tried nothing for the symptoms. The treatment provided mild relief.  Pt complains of pain in left 1st toe from dropping a leaf blower on her foot  Past Medical History  Diagnosis Date  . Colon polyps   . Diverticulosis of colon   . GERD (gastroesophageal reflux disease)   . Osteoarthritis   . Osteoporosis   . TR (tricuspid regurgitation)     Mild  . Vitamin B12 deficiency   . Vitamin D deficiency   . History of breast cancer   . Hyperlipidemia   . Familial tremor   . LBP (low back pain)   . Cataract   . Atrial fibrillation     D Taylor  . TIA (transient ischemic attack)   . Hemorrhoids   . Breast cancer 1984   Past Surgical History  Procedure Laterality Date  . Breast enhancement surgery    . Cataract extraction    . Total knee arthroplasty  Dec 2011    Right - Dr Noemi Chapel  . Bilateral mastectomy    . Polypectomy    . Colonoscopy    . Vaginal hysterectomy      LAVH BSO  . Oophorectomy      BSO  . Breast surgery      Bilateral mastectomy  . Augmentation mammaplasty     Family History  Problem Relation Age of Onset  . Early death Neg Hx   . Stroke Neg Hx   . Breast cancer Mother 84  . Tremor Mother   . Tremor Brother   . Colon cancer Maternal Aunt   . Ovarian cancer Maternal Grandmother    History  Substance Use Topics  . Smoking status: Never Smoker   . Smokeless tobacco: Not on file  .  Alcohol Use: No   OB History   Grav Para Term Preterm Abortions TAB SAB Ect Mult Living   0              Review of Systems  Musculoskeletal: Positive for myalgias and joint swelling.  Skin: Positive for color change.  All other systems reviewed and are negative.    Allergies  Ciprofloxacin and Simvastatin  Home Medications   Current Outpatient Rx  Name  Route  Sig  Dispense  Refill  . acetaminophen (TYLENOL) 500 MG tablet   Oral   Take 500 mg by mouth as needed.           [name] aspirin 81 MG EC tablet   Oral   Take 81 mg by mouth daily.           [name] atorvastatin (LIPITOR) 10 MG tablet   Oral   Take 1 tablet (10 mg total) by mouth daily.   90 tablet   2   . Cholecalciferol 1000 UNITS tablet   Oral   Take 1,000 Units by mouth daily.           . Cyanocobalamin (VITAMIN B-12 CR) 1000 MCG  TBCR   Oral   Take by mouth daily.           . flecainide (TAMBOCOR) 50 MG tablet   Oral   Take 1.5 tablets (75 mg total) by mouth 2 (two) times daily.   270 tablet   2   . metoprolol tartrate (LOPRESSOR) 25 MG tablet   Oral   Take 0.5 tablets (12.5 mg total) by mouth 2 (two) times daily.   90 tablet   2   . omeprazole (PRILOSEC) 20 MG capsule   Oral   Take 20 mg by mouth daily.           [name] warfarin (COUMADIN) 3 MG tablet   Oral   Take 2 tablets (6 mg total) by mouth at bedtime. Take as directed by the coumadin clinic.   180 tablet   3   . tobramycin-dexamethasone (TOBRADEX) ophthalmic solution   Both Eyes   Place 1 drop into both eyes every 4 (four) hours while awake.   5 mL   0    BP 131/60  Pulse 61  Temp(Src) 97.7 F (36.5 C) (Oral)  Resp 12  SpO2 98% Physical Exam  Constitutional: She is oriented to person, place, and time. She appears well-developed and well-nourished.  HENT:  Head: Normocephalic.  Musculoskeletal: She exhibits tenderness.  Bruised swollen left 1st toe,  From,  Ns and nv intact  Neurological: She is alert and oriented to  person, place, and time.  Skin: Skin is warm.    ED Course  Procedures (including critical care time) Labs Review Labs Reviewed - No data to display Imaging Review Dg Foot Complete Left  04/21/2013   *RADIOLOGY REPORT*  Clinical Data: Crush injury to left foot.  Pain great toe.  LEFT FOOT - COMPLETE 3+ VIEW  Comparison: None.  Findings: Multiple hammer toe deformities. No acute fracture or dislocation.  IMPRESSION: No acute osseous abnormality.   Original Report Authenticated By: Abigail Miyamoto, M.D.    MDM   1. Contusion of left great toe without damage to nail, initial encounter    Pt pending.   Ace wrap and post op shoe,   See your MD for recheck next week    Fransico Meadow, [place] 04/21/13 2116

## 2013-04-21 NOTE — ED Provider Notes (Signed)
Medical screening examination/treatment/procedure(s) were performed by non-physician practitioner and as supervising physician I was immediately available for consultation/collaboration.  Philipp Deputy, M.D.  Harden Mo, MD 04/21/13 2213

## 2013-04-21 NOTE — ED Notes (Signed)
Dropped a leaf blower on her L great toe today @ 1600.  Pt. is on coumadin.  Pt. Has pain, swelling and discoloration to same.

## 2013-04-24 NOTE — ED Notes (Signed)
Ace wrap and large female post op shoe applied to L foot.

## 2013-04-25 ENCOUNTER — Telehealth (HOSPITAL_COMMUNITY): Payer: Self-pay | Admitting: *Deleted

## 2013-04-25 NOTE — ED Notes (Signed)
PT 26.9 H, INR 2.59 H. 9/8 Message sent to [facility] PA.  She wrote, it was in the perfect range. Notify pt.  9/9 I called pt. Pt. verified x 2 and given results.  Pt. said she will call her nurse at the clinic. She may not have to get it rechecked this week, since she had it done here. Alyssa Luna 04/25/2013

## 2013-05-08 ENCOUNTER — Other Ambulatory Visit: Payer: Self-pay | Admitting: Cardiology

## 2013-05-08 ENCOUNTER — Other Ambulatory Visit: Payer: Self-pay | Admitting: General Practice

## 2013-05-08 ENCOUNTER — Encounter: Payer: Self-pay | Admitting: Internal Medicine

## 2013-05-08 ENCOUNTER — Ambulatory Visit (INDEPENDENT_AMBULATORY_CARE_PROVIDER_SITE_OTHER): Payer: 59 | Admitting: Internal Medicine

## 2013-05-08 VITALS — BP 136/69 | HR 56 | Ht 65.0 in | Wt 164.0 lb

## 2013-05-08 DIAGNOSIS — I4891 Unspecified atrial fibrillation: Secondary | ICD-10-CM

## 2013-05-08 DIAGNOSIS — G459 Transient cerebral ischemic attack, unspecified: Secondary | ICD-10-CM

## 2013-05-08 DIAGNOSIS — Z01810 Encounter for preprocedural cardiovascular examination: Secondary | ICD-10-CM | POA: Insufficient documentation

## 2013-05-08 MED ORDER — METOPROLOL TARTRATE 25 MG PO TABS: 12.5000 mg | ORAL_TABLET | Freq: Two times a day (BID) | ORAL | Status: DC

## 2013-05-08 MED ORDER — WARFARIN SODIUM 3 MG PO TABS: ORAL_TABLET | ORAL | Status: DC

## 2013-05-08 MED ORDER — FLECAINIDE ACETATE 50 MG PO TABS: 75.0000 mg | ORAL_TABLET | Freq: Two times a day (BID) | ORAL | Status: DC

## 2013-05-08 NOTE — Telephone Encounter (Signed)
Please refill.

## 2013-05-08 NOTE — Progress Notes (Signed)
HPI  Alyssa Luna returns today for followup. She is a pleasant 79 yo woman with a h/o PAF who has been well controlled on flecainide and a beta blocker. She has done well asked to 6 months. She denies palpitations. She is scheduled to have eye surgery in the next several weeks.  She denies chest pain or shortness of breath. The blood pressure is slightly elevated at times, but for the most part is been well-controlled. No syncope.  Allergies  Allergen Reactions  . Ciprofloxacin     GI upset   . Simvastatin     REACTION: leg cramps, weakness     Current Outpatient Prescriptions  Medication Sig Dispense Refill  . acetaminophen (TYLENOL) 500 MG tablet Take 325 mg by mouth as needed.       [name] aspirin 81 MG EC tablet Take 81 mg by mouth daily.        [name] atorvastatin (LIPITOR) 10 MG tablet Take 1 tablet (10 mg total) by mouth daily.  90 tablet  2  . Cholecalciferol 1000 UNITS tablet Take 1,000 Units by mouth daily. D-3      . Cyanocobalamin (VITAMIN B-12 CR) 1000 MCG TBCR Take by mouth daily.        . flecainide (TAMBOCOR) 50 MG tablet Take 1.5 tablets (75 mg total) by mouth 2 (two) times daily.  270 tablet  2  . metoprolol tartrate (LOPRESSOR) 25 MG tablet Take 0.5 tablets (12.5 mg total) by mouth 2 (two) times daily.  90 tablet  2  . omeprazole (PRILOSEC) 20 MG capsule Take 20 mg by mouth daily.        [name] warfarin (COUMADIN) 3 MG tablet Take 2 tablets (6 mg total) by mouth at bedtime. Take as directed by the coumadin clinic.  180 tablet  3   No current facility-administered medications for this visit.     Past Medical History  Diagnosis Date  . Colon polyps   . Diverticulosis of colon   . GERD (gastroesophageal reflux disease)   . Osteoarthritis   . Osteoporosis   . TR (tricuspid regurgitation)     Mild  . Vitamin B12 deficiency   . Vitamin D deficiency   . History of breast cancer   . Hyperlipidemia   . Familial tremor   . LBP (low back pain)   . Cataract   . Atrial  fibrillation     D [name]  . TIA (transient ischemic attack)   . Hemorrhoids   . Breast cancer 1984    ROS:   All systems reviewed and negative except as noted in the HPI.   Past Surgical History  Procedure Laterality Date  . Breast enhancement surgery    . Cataract extraction    . Total knee arthroplasty  Dec 2011    Right - Dr Noemi Chapel  . Bilateral mastectomy    . Polypectomy    . Colonoscopy    . Vaginal hysterectomy      LAVH BSO  . Oophorectomy      BSO  . Breast surgery      Bilateral mastectomy  . Augmentation mammaplasty       Family History  Problem Relation Age of Onset  . Early death Neg Hx   . Stroke Neg Hx   . Breast cancer Mother 86  . Tremor Mother   . Tremor Brother   . Colon cancer Maternal Aunt   . Ovarian cancer Maternal Grandmother      History  Social History  . Marital Status: Married    Spouse Name: N/A    Number of Children: N/A  . Years of Education: N/A   Occupational History  . [employer], Retired    Social History Main Topics  . Smoking status: Never Smoker   . Smokeless tobacco: Not on file  . Alcohol Use: No  . Drug Use: No  . Sexual Activity: No     Comment: HYST   Other Topics Concern  . Not on file   Social History Narrative   Regular Exercise -  NO           BP 136/69  Pulse 56  Ht 5' 5"  (1.651 m)  Wt 164 lb (74.39 kg)  BMI 27.29 kg/m2  Physical Exam:  Well appearing 79 year old woman, NAD HEENT: Unremarkable Neck:  No JVD, no thyromegally Back:  No CVA tenderness Lungs:  Clear with no wheezes, rales, or rhonchi. HEART:  Regular rate rhythm, no murmurs, no rubs, no clicks Abd:  soft, positive bowel sounds, no organomegally, no rebound, no guarding Ext:  2 plus pulses, no edema, no cyanosis, no clubbing Skin:  No rashes no nodules Neuro:  CN II through XII intact, motor grossly intact   Assess/Plan:

## 2013-05-08 NOTE — Assessment & Plan Note (Signed)
The patient had one episode of facial numbness years ago. There is no clear evidence that she had a TIA though it could not be excluded. I've asked the patient to stop taking aspirin.

## 2013-05-08 NOTE — Addendum Note (Signed)
Addended by: Arryn Terrones, Dola Factor D on: 05/08/2013 10:27 AM   Modules accepted: Orders, Medications

## 2013-05-08 NOTE — Patient Instructions (Addendum)
Your physician wants you to follow-up in: 6 months with Dr. Lovena Le. You will receive a reminder letter in the mail two months in advance. If you don't receive a letter, please call our office to schedule the follow-up appointment.  Your physician has recommended you make the following change in your medication: Stop Aspirin

## 2013-05-08 NOTE — Assessment & Plan Note (Signed)
The patient's low risk for cardiovascular complications from pending eye surgery. With regard to her anticoagulation, she may stop Coumadin 5 days prior to surgery. She does not need anticoagulation overlap.

## 2013-05-08 NOTE — Assessment & Plan Note (Signed)
She is maintaining sinus rhythm on low-dose beta blocker and flecainide. No change in medical therapy.

## 2013-05-16 ENCOUNTER — Ambulatory Visit (INDEPENDENT_AMBULATORY_CARE_PROVIDER_SITE_OTHER): Payer: Medicare Other | Admitting: Internal Medicine

## 2013-05-16 ENCOUNTER — Ambulatory Visit (INDEPENDENT_AMBULATORY_CARE_PROVIDER_SITE_OTHER): Payer: 59 | Admitting: General Practice

## 2013-05-16 ENCOUNTER — Encounter: Payer: Self-pay | Admitting: Internal Medicine

## 2013-05-16 VITALS — BP 130/78 | HR 80 | Temp 97.1°F | Ht 65.0 in | Wt 163.0 lb

## 2013-05-16 DIAGNOSIS — Z0000 Encounter for general adult medical examination without abnormal findings: Secondary | ICD-10-CM

## 2013-05-16 DIAGNOSIS — I4891 Unspecified atrial fibrillation: Secondary | ICD-10-CM

## 2013-05-16 DIAGNOSIS — Z23 Encounter for immunization: Secondary | ICD-10-CM

## 2013-05-16 DIAGNOSIS — Z7901 Long term (current) use of anticoagulants: Secondary | ICD-10-CM

## 2013-05-16 LAB — POCT INR: INR: 2.9

## 2013-05-16 MED ORDER — ATORVASTATIN CALCIUM 10 MG PO TABS: 10.0000 mg | ORAL_TABLET | Freq: Every day | ORAL | Status: DC

## 2013-05-17 ENCOUNTER — Other Ambulatory Visit (INDEPENDENT_AMBULATORY_CARE_PROVIDER_SITE_OTHER): Payer: 59

## 2013-05-17 DIAGNOSIS — G459 Transient cerebral ischemic attack, unspecified: Secondary | ICD-10-CM

## 2013-05-17 DIAGNOSIS — I4891 Unspecified atrial fibrillation: Secondary | ICD-10-CM

## 2013-05-17 DIAGNOSIS — M545 Low back pain: Secondary | ICD-10-CM

## 2013-05-17 DIAGNOSIS — E559 Vitamin D deficiency, unspecified: Secondary | ICD-10-CM

## 2013-05-17 DIAGNOSIS — E538 Deficiency of other specified B group vitamins: Secondary | ICD-10-CM

## 2013-05-17 LAB — BASIC METABOLIC PANEL
CO2: 27 mEq/L (ref 19–32)
Calcium: 9.1 mg/dL (ref 8.4–10.5)
GFR: 72.74 mL/min (ref >60.00)
Glucose, Bld: 73 mg/dL (ref 70–99)
Potassium: 4.9 mEq/L (ref 3.5–5.1)
Sodium: 143 mEq/L (ref 135–145)

## 2013-05-21 ENCOUNTER — Encounter: Payer: Self-pay | Admitting: Internal Medicine

## 2013-05-21 DIAGNOSIS — Z0000 Encounter for general adult medical examination without abnormal findings: Secondary | ICD-10-CM | POA: Insufficient documentation

## 2013-05-21 NOTE — Progress Notes (Signed)
   Subjective:    HPI   The patient is here for a wellness exam. The patient has been doing well overall without major physical or psychological issues going on lately.  The patient presents for a follow-up of  chronic hypertension, chronic dyslipidemia, A fib, tremor. She is taking Lipitor 1/2 tab qd  Her HR, BPs - OK at home  BP Readings from Last 3 Encounters:  05/16/13 130/78  05/08/13 136/69  04/21/13 131/60   Wt Readings from Last 3 Encounters:  05/16/13 163 lb (73.936 kg)  05/08/13 164 lb (74.39 kg)  02/14/13 162 lb (73.483 kg)      Review of Systems  Constitutional: Positive for fatigue. Negative for activity change, appetite change and unexpected weight change.  HENT: Negative for congestion, mouth sores and sinus pressure.   Eyes: Negative for visual disturbance.  Respiratory: Negative for chest tightness.   Gastrointestinal: Negative for nausea and abdominal pain.  Genitourinary: Negative for frequency, difficulty urinating and vaginal pain.  Musculoskeletal: Positive for back pain and arthralgias. Negative for gait problem.  Skin: Negative for pallor.  Neurological: Positive for tremors. Negative for dizziness, weakness and numbness.  Psychiatric/Behavioral: Negative for confusion and sleep disturbance.       Objective:   Physical Exam  Constitutional: She appears well-developed and well-nourished. No distress.  HENT:  Head: Normocephalic.  Right Ear: External ear normal.  Left Ear: External ear normal.  Nose: Nose normal.  Mouth/Throat: Oropharynx is clear and moist.  Eyes: Conjunctivae are normal. Pupils are equal, round, and reactive to light. Right eye exhibits no discharge. Left eye exhibits no discharge.  Neck: Normal range of motion. Neck supple. No JVD present. No tracheal deviation present. No thyromegaly present.  Cardiovascular: Normal rate and normal heart sounds.   irreg irreg  Pulmonary/Chest: No stridor. No respiratory distress. She has  no wheezes.  Abdominal: Soft. Bowel sounds are normal. She exhibits no distension and no mass. There is no tenderness. There is no rebound and no guarding.  Musculoskeletal: She exhibits no edema and no tenderness.  Lymphadenopathy:    She has no cervical adenopathy.  Neurological: She displays normal reflexes. No cranial nerve deficit. She exhibits normal muscle tone. Coordination normal.  Skin: No rash noted. No erythema.  Psychiatric: She has a normal mood and affect. Her behavior is normal. Judgment and thought content normal.  mild tremor  Lab Results  Component Value Date   WBC 3.0* 11/11/2012   HGB 13.8 11/11/2012   HCT 40.6 11/11/2012   PLT 237.0 11/11/2012   GLUCOSE 73 05/17/2013   CHOL 163 11/11/2012   TRIG 124.0 11/11/2012   HDL 35.90* 11/11/2012   LDLDIRECT 178.0 09/24/2011   LDLCALC 102* 11/11/2012   ALT 28 11/11/2012   AST 35 11/11/2012   NA 143 05/17/2013   K 4.9 05/17/2013   CL 109 05/17/2013   CREATININE 0.8 05/17/2013   BUN 15 05/17/2013   CO2 27 05/17/2013   TSH 2.84 11/11/2012   INR 2.9 05/16/2013          Assessment & Plan:

## 2013-05-26 ENCOUNTER — Telehealth: Payer: Self-pay | Admitting: Internal Medicine

## 2013-05-26 NOTE — Telephone Encounter (Signed)
Received request from Nurse, documents faxed for surgical clearance. To: [facility] Fax number: [phone] Attention: 05/26/13/KM

## 2013-06-05 ENCOUNTER — Ambulatory Visit: Payer: 59 | Admitting: Internal Medicine

## 2013-06-13 ENCOUNTER — Ambulatory Visit (INDEPENDENT_AMBULATORY_CARE_PROVIDER_SITE_OTHER): Payer: 59 | Admitting: General Practice

## 2013-06-13 DIAGNOSIS — Z23 Encounter for immunization: Secondary | ICD-10-CM

## 2013-06-13 DIAGNOSIS — I4891 Unspecified atrial fibrillation: Secondary | ICD-10-CM

## 2013-06-13 DIAGNOSIS — Z7901 Long term (current) use of anticoagulants: Secondary | ICD-10-CM

## 2013-06-13 NOTE — Patient Instructions (Addendum)
11/1 - Take last dose of coumadin  Re-start coumadin on 11/9 11-9 Take 2 1/2 tablets 11/10 - Take 2 1/2 tablets 11/11 - Take 2 tablets 11/12 - Take 2 tablets 11/13 - Take 2 tablets 11/14 - Re-check INR

## 2013-06-30 ENCOUNTER — Ambulatory Visit (INDEPENDENT_AMBULATORY_CARE_PROVIDER_SITE_OTHER): Payer: 59 | Admitting: General Practice

## 2013-06-30 DIAGNOSIS — Z7901 Long term (current) use of anticoagulants: Secondary | ICD-10-CM

## 2013-06-30 DIAGNOSIS — I4891 Unspecified atrial fibrillation: Secondary | ICD-10-CM

## 2013-06-30 LAB — POCT INR: INR: 1.6

## 2013-06-30 NOTE — Progress Notes (Signed)
Pre-visit discussion using our clinic review tool. No additional management support is needed unless otherwise documented below in the visit note.  

## 2013-07-14 ENCOUNTER — Ambulatory Visit (INDEPENDENT_AMBULATORY_CARE_PROVIDER_SITE_OTHER): Payer: 59 | Admitting: General Practice

## 2013-07-14 DIAGNOSIS — I4891 Unspecified atrial fibrillation: Secondary | ICD-10-CM

## 2013-07-14 DIAGNOSIS — Z7901 Long term (current) use of anticoagulants: Secondary | ICD-10-CM

## 2013-07-14 LAB — POCT INR: INR: 2.9

## 2013-07-14 NOTE — Progress Notes (Signed)
Pre-visit discussion using our clinic review tool. No additional management support is needed unless otherwise documented below in the visit note.  

## 2013-08-11 ENCOUNTER — Ambulatory Visit (INDEPENDENT_AMBULATORY_CARE_PROVIDER_SITE_OTHER): Payer: 59 | Admitting: General Practice

## 2013-08-11 DIAGNOSIS — Z7901 Long term (current) use of anticoagulants: Secondary | ICD-10-CM

## 2013-08-11 DIAGNOSIS — I4891 Unspecified atrial fibrillation: Secondary | ICD-10-CM

## 2013-08-11 LAB — POCT INR: INR: 3.6

## 2013-08-11 NOTE — Progress Notes (Signed)
Pre-visit discussion using our clinic review tool. No additional management support is needed unless otherwise documented below in the visit note.  

## 2013-08-30 ENCOUNTER — Telehealth: Payer: Self-pay | Admitting: *Deleted

## 2013-08-30 ENCOUNTER — Other Ambulatory Visit: Payer: Self-pay | Admitting: General Practice

## 2013-08-30 MED ORDER — WARFARIN SODIUM 3 MG PO TABS: ORAL_TABLET | ORAL | Status: DC

## 2013-08-30 NOTE — Telephone Encounter (Signed)
Rf req for warfarin tab.

## 2013-09-08 ENCOUNTER — Ambulatory Visit (INDEPENDENT_AMBULATORY_CARE_PROVIDER_SITE_OTHER): Payer: 59 | Admitting: General Practice

## 2013-09-08 DIAGNOSIS — Z5181 Encounter for therapeutic drug level monitoring: Secondary | ICD-10-CM

## 2013-09-08 DIAGNOSIS — I4891 Unspecified atrial fibrillation: Secondary | ICD-10-CM

## 2013-09-08 DIAGNOSIS — Z7901 Long term (current) use of anticoagulants: Secondary | ICD-10-CM

## 2013-09-08 LAB — POCT INR: INR: 3.3

## 2013-09-08 NOTE — Progress Notes (Signed)
Pre-visit discussion using our clinic review tool. No additional management support is needed unless otherwise documented below in the visit note.  

## 2013-09-19 ENCOUNTER — Telehealth: Payer: Self-pay | Admitting: Internal Medicine

## 2013-09-19 NOTE — Telephone Encounter (Signed)
New message  Pt called states that she has AFIB and she is requesting a call back to confirm if it is OK to be 8,000 [place] above sea level..she is planning a trip to [place]// please call//

## 2013-09-19 NOTE — Telephone Encounter (Signed)
Dr Lovena Le says okay to go on trip

## 2013-09-20 NOTE — Telephone Encounter (Signed)
Left message for patient that Dr Lovena Le says okay to go on trip

## 2013-10-02 ENCOUNTER — Encounter: Payer: Self-pay | Admitting: Internal Medicine

## 2013-10-02 ENCOUNTER — Ambulatory Visit (INDEPENDENT_AMBULATORY_CARE_PROVIDER_SITE_OTHER): Payer: 59 | Admitting: Internal Medicine

## 2013-10-02 VITALS — BP 112/70 | HR 80 | Temp 97.0°F | Resp 16 | Wt 160.0 lb

## 2013-10-02 DIAGNOSIS — M1990 Unspecified osteoarthritis, unspecified site: Secondary | ICD-10-CM

## 2013-10-02 DIAGNOSIS — E538 Deficiency of other specified B group vitamins: Secondary | ICD-10-CM

## 2013-10-02 DIAGNOSIS — M545 Low back pain, unspecified: Secondary | ICD-10-CM

## 2013-10-02 DIAGNOSIS — R1032 Left lower quadrant pain: Secondary | ICD-10-CM | POA: Insufficient documentation

## 2013-10-02 DIAGNOSIS — R197 Diarrhea, unspecified: Secondary | ICD-10-CM

## 2013-10-02 MED ORDER — AMOXICILLIN-POT CLAVULANATE 875-125 MG PO TABS: 1.0000 | ORAL_TABLET | Freq: Two times a day (BID) | ORAL | Status: DC

## 2013-10-02 NOTE — Assessment & Plan Note (Signed)
Continue with current prescription therapy as reflected on the Med list.  

## 2013-10-02 NOTE — Progress Notes (Signed)
Pre visit review using our clinic review tool, if applicable. No additional management support is needed unless otherwise documented below in the visit note. 

## 2013-10-02 NOTE — Progress Notes (Signed)
   Subjective:    HPI  C/o LBP after lifting a box of cat litter - took Vicodin C/o abd bloating and pain on L side x 1 week C/o L knee pain - she saw Dr Noemi Chapel and had a shot, Prednisone taper, Pennsaid  The patient presents for a follow-up of  chronic hypertension, chronic dyslipidemia, A fib, tremor. She is taking Lipitor 1/2 tab qd  Her HR, BPs - OK at home  BP Readings from Last 3 Encounters:  10/02/13 112/70  05/16/13 130/78  05/08/13 136/69   Wt Readings from Last 3 Encounters:  10/02/13 160 lb (72.576 kg)  05/16/13 163 lb (73.936 kg)  05/08/13 164 lb (74.39 kg)      Review of Systems  Constitutional: Positive for fatigue. Negative for activity change, appetite change and unexpected weight change.  HENT: Negative for congestion, mouth sores and sinus pressure.   Eyes: Negative for visual disturbance.  Respiratory: Negative for chest tightness.   Gastrointestinal: Negative for nausea and abdominal pain.  Genitourinary: Negative for frequency, difficulty urinating and vaginal pain.  Musculoskeletal: Positive for arthralgias and back pain. Negative for gait problem.  Skin: Negative for pallor.  Neurological: Positive for tremors. Negative for dizziness, weakness and numbness.  Psychiatric/Behavioral: Negative for confusion and sleep disturbance.       Objective:   Physical Exam  Constitutional: She appears well-developed and well-nourished. No distress.  HENT:  Head: Normocephalic.  Right Ear: External ear normal.  Left Ear: External ear normal.  Nose: Nose normal.  Mouth/Throat: Oropharynx is clear and moist.  Eyes: Conjunctivae are normal. Pupils are equal, round, and reactive to light. Right eye exhibits no discharge. Left eye exhibits no discharge.  Neck: Normal range of motion. Neck supple. No JVD present. No tracheal deviation present. No thyromegaly present.  Cardiovascular: Normal rate and normal heart sounds.   irreg irreg  Pulmonary/Chest: No  stridor. No respiratory distress. She has no wheezes.  Abdominal: Soft. Bowel sounds are normal. She exhibits no distension and no mass. There is no tenderness. There is no rebound and no guarding.  Musculoskeletal: She exhibits no edema and no tenderness.  Lymphadenopathy:    She has no cervical adenopathy.  Neurological: She displays normal reflexes. No cranial nerve deficit. She exhibits normal muscle tone. Coordination normal.  Skin: No rash noted. No erythema.  Psychiatric: She has a normal mood and affect. Her behavior is normal. Judgment and thought content normal.  mild tremor LLQ is sensitive - no mass or rebound  Lab Results  Component Value Date   WBC 3.0* 11/11/2012   HGB 13.8 11/11/2012   HCT 40.6 11/11/2012   PLT 237.0 11/11/2012   GLUCOSE 73 05/17/2013   CHOL 163 11/11/2012   TRIG 124.0 11/11/2012   HDL 35.90* 11/11/2012   LDLDIRECT 178.0 09/24/2011   LDLCALC 102* 11/11/2012   ALT 28 11/11/2012   AST 35 11/11/2012   NA 143 05/17/2013   K 4.9 05/17/2013   CL 109 05/17/2013   CREATININE 0.8 05/17/2013   BUN 15 05/17/2013   CO2 27 05/17/2013   TSH 2.84 11/11/2012   INR 3.3 09/08/2013          Assessment & Plan:

## 2013-10-02 NOTE — Assessment & Plan Note (Addendum)
2/15 ?diverticulitis ?better Start Augmentin if worse. Low residue diet

## 2013-10-06 ENCOUNTER — Ambulatory Visit (INDEPENDENT_AMBULATORY_CARE_PROVIDER_SITE_OTHER): Payer: 59 | Admitting: General Practice

## 2013-10-06 ENCOUNTER — Other Ambulatory Visit (INDEPENDENT_AMBULATORY_CARE_PROVIDER_SITE_OTHER): Payer: 59

## 2013-10-06 ENCOUNTER — Ambulatory Visit (INDEPENDENT_AMBULATORY_CARE_PROVIDER_SITE_OTHER): Payer: 59 | Admitting: Internal Medicine

## 2013-10-06 ENCOUNTER — Encounter: Payer: Self-pay | Admitting: Internal Medicine

## 2013-10-06 VITALS — BP 142/80 | HR 68 | Temp 98.1°F | Wt 159.1 lb

## 2013-10-06 DIAGNOSIS — R197 Diarrhea, unspecified: Secondary | ICD-10-CM

## 2013-10-06 DIAGNOSIS — M545 Low back pain, unspecified: Secondary | ICD-10-CM

## 2013-10-06 DIAGNOSIS — R1032 Left lower quadrant pain: Secondary | ICD-10-CM

## 2013-10-06 DIAGNOSIS — G459 Transient cerebral ischemic attack, unspecified: Secondary | ICD-10-CM

## 2013-10-06 DIAGNOSIS — I4891 Unspecified atrial fibrillation: Secondary | ICD-10-CM

## 2013-10-06 DIAGNOSIS — Z5181 Encounter for therapeutic drug level monitoring: Secondary | ICD-10-CM

## 2013-10-06 LAB — URINALYSIS, ROUTINE W REFLEX MICROSCOPIC
BILIRUBIN URINE: NEGATIVE
KETONES UR: 15 — AB
LEUKOCYTES UA: NEGATIVE
NITRITE: NEGATIVE
Specific Gravity, Urine: 1.01 (ref 1.000–1.030)
Total Protein, Urine: NEGATIVE
Urine Glucose: NEGATIVE
Urobilinogen, UA: 0.2 (ref 0.0–1.0)
WBC, UA: NONE SEEN (ref 0–?)
pH: 6 (ref 5.0–8.0)

## 2013-10-06 LAB — HEPATIC FUNCTION PANEL
ALK PHOS: 105 U/L (ref 39–117)
ALT: 22 U/L (ref 0–35)
AST: 28 U/L (ref 0–37)
Albumin: 4 g/dL (ref 3.5–5.2)
BILIRUBIN DIRECT: 0.1 mg/dL (ref 0.0–0.3)
BILIRUBIN TOTAL: 0.9 mg/dL (ref 0.3–1.2)
Total Protein: 7.4 g/dL (ref 6.0–8.3)

## 2013-10-06 LAB — BASIC METABOLIC PANEL
BUN: 8 mg/dL (ref 6–23)
CHLORIDE: 104 meq/L (ref 96–112)
CO2: 28 mEq/L (ref 19–32)
Calcium: 9.7 mg/dL (ref 8.4–10.5)
Creatinine, Ser: 0.7 mg/dL (ref 0.4–1.2)
GFR: 83.25 mL/min (ref >60.00)
Glucose, Bld: 84 mg/dL (ref 70–99)
POTASSIUM: 4.3 meq/L (ref 3.5–5.1)
Sodium: 138 mEq/L (ref 135–145)

## 2013-10-06 LAB — CBC WITH DIFFERENTIAL/PLATELET
Basophils Absolute: 0.1 10*3/uL (ref 0.0–0.1)
Basophils Relative: 1.1 % (ref 0.0–3.0)
EOS ABS: 0.1 10*3/uL (ref 0.0–0.7)
Eosinophils Relative: 1.3 % (ref 0.0–5.0)
HCT: 42.9 % (ref 36.0–46.0)
Hemoglobin: 14.2 g/dL (ref 12.0–15.0)
Lymphocytes Relative: 31.9 % (ref 12.0–46.0)
Lymphs Abs: 1.8 10*3/uL (ref 0.7–4.0)
MCHC: 33 g/dL (ref 30.0–36.0)
MCV: 89.3 fl (ref 78.0–100.0)
MONO ABS: 0.5 10*3/uL (ref 0.1–1.0)
Monocytes Relative: 8.3 % (ref 3.0–12.0)
NEUTROS PCT: 57.4 % (ref 43.0–77.0)
Neutro Abs: 3.3 10*3/uL (ref 1.4–7.7)
PLATELETS: 267 10*3/uL (ref 150.0–400.0)
RBC: 4.81 Mil/uL (ref 3.87–5.11)
RDW: 13.4 % (ref 11.5–14.6)
WBC: 5.7 10*3/uL (ref 4.5–10.5)

## 2013-10-06 LAB — LIPASE: LIPASE: 37 U/L (ref 11.0–59.0)

## 2013-10-06 LAB — POCT INR: INR: 6.7

## 2013-10-06 MED ORDER — METRONIDAZOLE 250 MG PO TABS: 250.0000 mg | ORAL_TABLET | Freq: Three times a day (TID) | ORAL | Status: DC

## 2013-10-06 MED ORDER — LEVOFLOXACIN 250 MG PO TABS: 250.0000 mg | ORAL_TABLET | Freq: Every day | ORAL | Status: DC

## 2013-10-06 NOTE — Progress Notes (Signed)
Pre visit review using our clinic review tool, if applicable. No additional management support is needed unless otherwise documented below in the visit note. 

## 2013-10-06 NOTE — Patient Instructions (Signed)
OK to stop the hydrocodone, and the augmentin (amoxicillin type medication) Please continue all other medications as before, including the muscle relaxer as needed  You are given the prescriptions for 2 other antibiotics, BUT:  Take the levaquin and metronidazole ONLY if the left lower pain gets worse again, there is fever, and NO diarrhea  Take the metronidazole ONLY if the Left lower pain is worse and the diarrhea gets worse again  There is NO NEED to take either of these antibiotics if the pain and diarrhea continue to improve, and you have no fever  Please go to the LAB in the Basement (turn left off the elevator) for the tests to be done today  You will be contacted by phone if any changes need to be made immediately.  Otherwise, you will receive a letter about your results with an explanation, but please check with MyChart first.  Please remember to sign up for MyChart if you have not done so, as this will be important to you in the future with finding out test results, communicating by private email, and scheduling acute appointments online when needed.

## 2013-10-06 NOTE — Progress Notes (Signed)
Subjective:    Patient ID: Alyssa Luna, female    DOB: 1934-02-05, 80 y.o.   MRN: [id number]  HPI  Here to f/u, S/p knee cortisone approx 3 wks ago.  More recently had episode of bilat LBP apparently due to strain, seen and tx successfully with muscle relaxer, medrol pack, and vicodin. (now right improved, left persists, better especially with the tizanidine)  Unfortuanately then had onset constipation, some better with stool softner, but then LLQ pain persisted, saw Dr AVP  - tx with augmentin, then onset loose and watery diarrhea without blood, n/v, other abd pain. Ongoing for several days now, but today somewhat improved, as well as the LLQ seems near resolved compared to recent level of pain. No quite nervous today about what next, most recent INR elevated as well, but no bleeding/bruising, has f/u INR next tues. No fever, ST, HA, and Pt denies chest pain, increased sob or doe, wheezing, orthopnea, PND, increased LE swelling, palpitations, dizziness or syncope.  Denies urinary symptoms such as dysuria, frequency, urgency, flank pain, hematuria Past Medical History  Diagnosis Date  . Colon polyps   . Diverticulosis of colon   . GERD (gastroesophageal reflux disease)   . Osteoarthritis   . Osteoporosis   . TR (tricuspid regurgitation)     Mild  . Vitamin B12 deficiency   . Vitamin D deficiency   . History of breast cancer   . Hyperlipidemia   . Familial tremor   . LBP (low back pain)   . Cataract   . Atrial fibrillation     D Taylor  . TIA (transient ischemic attack)   . Hemorrhoids   . Breast cancer 1984   Past Surgical History  Procedure Laterality Date  . Breast enhancement surgery    . Cataract extraction    . Total knee arthroplasty  Dec 2011    Right - Dr Noemi Chapel  . Bilateral mastectomy    . Polypectomy    . Colonoscopy    . Vaginal hysterectomy      [facility] BSO  . Oophorectomy      BSO  . Breast surgery      Bilateral mastectomy  . Augmentation mammaplasty       reports that she has never smoked. She does not have any smokeless tobacco history on file. She reports that she does not drink alcohol or use illicit drugs. family history includes Breast cancer (age of onset: 47) in her mother; Colon cancer in her maternal aunt; Ovarian cancer in her maternal grandmother; Tremor in her brother and mother. There is no history of Early death or Stroke. Allergies  Allergen Reactions  . Ciprofloxacin     GI upset   . Simvastatin     REACTION: leg cramps, weakness     Review of Systems  Constitutional: Negative for unexpected weight change, or unusual diaphoresis  HENT: Negative for tinnitus.   Eyes: Negative for photophobia and visual disturbance.  Respiratory: Negative for choking and stridor.   Gastrointestinal: Negative for vomiting and blood in stool.  Genitourinary: Negative for hematuria and decreased urine volume.  Musculoskeletal: Negative for acute joint swelling Skin: Negative for color change and wound.  Neurological: Negative for tremors and numbness other than noted  Psychiatric/Behavioral: Negative for decreased concentration or  hyperactivity.       Objective:   Physical Exam BP 142/80  Pulse 68  Temp(Src) 98.1 F (36.7 C) (Oral)  Wt 159 lb 2 oz (72.179 kg)  SpO2 93%  VS noted,  Constitutional: Pt appears well-developed and well-nourished.  HENT: Head: NCAT.  Right Ear: External ear normal.  Left Ear: External ear normal.  Eyes: Conjunctivae and EOM are normal. Pupils are equal, round, and reactive to light.  Neck: Normal range of motion. Neck supple.  Cardiovascular: Normal rate and regular rhythm.   Pulmonary/Chest: Effort normal and breath sounds normal.  Abd:  Soft, NT, non-distended, + BS Spine nontender, has left paravertebral tender Neurological: Pt is alert. Not confused , motor 5/5 Skin: Skin is warm. No erythema. No rash Psychiatric: Pt behavior is normal. Thought content normal.     Assessment & Plan:

## 2013-10-06 NOTE — Assessment & Plan Note (Signed)
C/w msk strain most likley +/- lumbar djd/ddd, cont tizanidine prn

## 2013-10-06 NOTE — Progress Notes (Signed)
Pre-visit discussion using our clinic review tool. No additional management support is needed unless otherwise documented below in the visit note.  

## 2013-10-06 NOTE — Assessment & Plan Note (Addendum)
I suspect most likely related to constipation now improved, for lab eval,  to f/u any worsening symptoms or concerns; for levaquin/flagyl if worsens again over weekend  - gave rx but Only if pain/fever worse; to check cbc today with labs  Note:  Total time for pt hx, exam, review of record with pt in the room, determination of diagnoses and plan for further eval and tx is > 40 min, with over 50% spent in coordination and counseling of patient

## 2013-10-06 NOTE — Assessment & Plan Note (Signed)
Most liekly augmentin related, to d/c augmentin, check c diff but seems less liekly with pain and stooling improving, for flagyl if worse but only if worse

## 2013-10-09 LAB — SEDIMENTATION RATE: Sed Rate: 26 mm/hr — ABNORMAL HIGH (ref 0–22)

## 2013-10-10 ENCOUNTER — Encounter: Payer: Self-pay | Admitting: Internal Medicine

## 2013-10-11 ENCOUNTER — Ambulatory Visit (INDEPENDENT_AMBULATORY_CARE_PROVIDER_SITE_OTHER): Payer: 59 | Admitting: General Practice

## 2013-10-11 DIAGNOSIS — G459 Transient cerebral ischemic attack, unspecified: Secondary | ICD-10-CM

## 2013-10-11 DIAGNOSIS — I4891 Unspecified atrial fibrillation: Secondary | ICD-10-CM

## 2013-10-11 DIAGNOSIS — Z5181 Encounter for therapeutic drug level monitoring: Secondary | ICD-10-CM

## 2013-10-11 LAB — POCT INR: INR: 1.2

## 2013-10-11 NOTE — Progress Notes (Signed)
Pre visit review using our clinic review tool, if applicable. No additional management support is needed unless otherwise documented below in the visit note. 

## 2013-10-24 ENCOUNTER — Ambulatory Visit (INDEPENDENT_AMBULATORY_CARE_PROVIDER_SITE_OTHER): Payer: 59 | Admitting: General Practice

## 2013-10-24 DIAGNOSIS — I4891 Unspecified atrial fibrillation: Secondary | ICD-10-CM

## 2013-10-24 DIAGNOSIS — G459 Transient cerebral ischemic attack, unspecified: Secondary | ICD-10-CM

## 2013-10-24 DIAGNOSIS — Z5181 Encounter for therapeutic drug level monitoring: Secondary | ICD-10-CM

## 2013-10-24 LAB — POCT INR: INR: 5.4

## 2013-10-24 NOTE — Progress Notes (Signed)
Pre visit review using our clinic review tool, if applicable. No additional management support is needed unless otherwise documented below in the visit note. 

## 2013-11-02 ENCOUNTER — Telehealth: Payer: Self-pay | Admitting: Internal Medicine

## 2013-11-02 NOTE — Telephone Encounter (Signed)
Rec'd from [place] forward 2 pages to Dr.Plotnikov

## 2013-11-07 ENCOUNTER — Ambulatory Visit (INDEPENDENT_AMBULATORY_CARE_PROVIDER_SITE_OTHER): Payer: 59 | Admitting: General Practice

## 2013-11-07 DIAGNOSIS — G459 Transient cerebral ischemic attack, unspecified: Secondary | ICD-10-CM

## 2013-11-07 DIAGNOSIS — Z5181 Encounter for therapeutic drug level monitoring: Secondary | ICD-10-CM

## 2013-11-07 DIAGNOSIS — I4891 Unspecified atrial fibrillation: Secondary | ICD-10-CM

## 2013-11-07 LAB — POCT INR: INR: 2.3

## 2013-11-07 NOTE — Progress Notes (Signed)
Pre visit review using our clinic review tool, if applicable. No additional management support is needed unless otherwise documented below in the visit note. 

## 2013-11-09 ENCOUNTER — Ambulatory Visit (INDEPENDENT_AMBULATORY_CARE_PROVIDER_SITE_OTHER): Payer: 59 | Admitting: Internal Medicine

## 2013-11-09 ENCOUNTER — Encounter: Payer: Self-pay | Admitting: Internal Medicine

## 2013-11-09 VITALS — BP 147/69 | HR 58 | Ht 62.5 in | Wt 160.0 lb

## 2013-11-09 DIAGNOSIS — I4891 Unspecified atrial fibrillation: Secondary | ICD-10-CM

## 2013-11-09 DIAGNOSIS — I498 Other specified cardiac arrhythmias: Secondary | ICD-10-CM

## 2013-11-09 DIAGNOSIS — I10 Essential (primary) hypertension: Secondary | ICD-10-CM

## 2013-11-09 DIAGNOSIS — R001 Bradycardia, unspecified: Secondary | ICD-10-CM

## 2013-11-09 MED ORDER — CARVEDILOL 3.125 MG PO TABS: 3.1250 mg | ORAL_TABLET | Freq: Two times a day (BID) | ORAL | Status: DC

## 2013-11-09 NOTE — Patient Instructions (Signed)
Your physician wants you to follow-up in: 6 months with Dr Knox Saliva will receive a reminder letter in the mail two months in advance. If you don't receive a letter, please call our office to schedule the follow-up appointment.    Your physician has recommended you make the following change in your medication:  1) Start Carvedilol 3.142m twice daily 2) Stop Metoprolol

## 2013-11-09 NOTE — Assessment & Plan Note (Signed)
Her blood pressure has been elevated. I have asked the patient to switch beta blockers from metoprolol to carvedilol.

## 2013-11-09 NOTE — Assessment & Plan Note (Signed)
She is maintaining NSR very nicely on low dose flecainide. She will continue this medication.

## 2013-11-09 NOTE — Assessment & Plan Note (Signed)
Her heart rates have been on the slow side. Hopefully switching beta blockers will help.

## 2013-11-09 NOTE — Progress Notes (Signed)
HPI  Alyssa Luna returns today for followup. She is a pleasant 80 yo woman with a h/o PAF who has been well controlled on flecainide and a beta blocker. She has done well over the last 6 months. With only occaisional palpitations. She denies chest pain or shortness of breath. The blood pressure is slightly elevated at times, and heart rate slower. No syncope.  Allergies  Allergen Reactions  . Ciprofloxacin     GI upset   . Simvastatin     REACTION: leg cramps, weakness     Current Outpatient Prescriptions  Medication Sig Dispense Refill  . acetaminophen (TYLENOL) 500 MG tablet Take 325 mg by mouth as needed.       . Cholecalciferol 1000 UNITS tablet Take 1,000 Units by mouth daily. D-3      . Cyanocobalamin (VITAMIN B-12 CR) 1000 MCG TBCR Take by mouth daily.        . flecainide (TAMBOCOR) 50 MG tablet Take 1.5 tablets (75 mg total) by mouth 2 (two) times daily.  270 tablet  2  . omeprazole (PRILOSEC) 20 MG capsule Take 20 mg by mouth daily.        . tizanidine (ZANAFLEX) 2 MG capsule Take 2-4 mg by mouth as needed.       . warfarin (COUMADIN) 3 MG tablet Take as directed by the coumadin clinic.  180 tablet  0  . carvedilol (COREG) 3.125 MG tablet Take 1 tablet (3.125 mg total) by mouth 2 (two) times daily.  180 tablet  3   No current facility-administered medications for this visit.     Past Medical History  Diagnosis Date  . Colon polyps   . Diverticulosis of colon   . GERD (gastroesophageal reflux disease)   . Osteoarthritis   . Osteoporosis   . TR (tricuspid regurgitation)     Mild  . Vitamin B12 deficiency   . Vitamin D deficiency   . History of breast cancer   . Hyperlipidemia   . Familial tremor   . LBP (low back pain)   . Cataract   . Atrial fibrillation     D Taylor  . TIA (transient ischemic attack)   . Hemorrhoids   . Breast cancer 1984    ROS:   All systems reviewed and negative except as noted in the HPI.   Past Surgical History  Procedure  Laterality Date  . Breast enhancement surgery    . Cataract extraction    . Total knee arthroplasty  Dec 2011    Right - Dr Noemi Chapel  . Bilateral mastectomy    . Polypectomy    . Colonoscopy    . Vaginal hysterectomy      [facility] BSO  . Oophorectomy      BSO  . Breast surgery      Bilateral mastectomy  . Augmentation mammaplasty       Family History  Problem Relation Age of Onset  . Early death Neg Hx   . Stroke Neg Hx   . Breast cancer Mother 75  . Tremor Mother   . Tremor Brother   . Colon cancer Maternal Aunt   . Ovarian cancer Maternal Grandmother      History   Social History  . Marital Status: Married    Spouse Name: N/A    Number of Children: N/A  . Years of Education: N/A   Occupational History  . [employer], Retired    Social History Main Topics  . Smoking status: Never Smoker   .  Smokeless tobacco: Not on file  . Alcohol Use: No  . Drug Use: No  . Sexual Activity: No     Comment: HYST   Other Topics Concern  . Not on file   Social History Narrative   Regular Exercise -  NO           BP 147/69  Pulse 58  Ht 5' 2.5" (1.588 m)  Wt 160 lb (72.576 kg)  BMI 28.78 kg/m2  Physical Exam:  Well appearing 80 year-old woman, NAD HEENT: Unremarkable Neck:  No JVD, no thyromegally Back:  No CVA tenderness Lungs:  Clear with no wheezes, rales, or rhonchi. HEART:  IRegular rate rhythm, no murmurs, no rubs, no clicks Abd:  soft, positive bowel sounds, no organomegally, no rebound, no guarding Ext:  2 plus pulses, no edema, no cyanosis, no clubbing Skin:  No rashes no nodules Neuro:  CN II through XII intact, motor grossly intact   Assess/Plan:

## 2013-11-14 ENCOUNTER — Encounter: Payer: Self-pay | Admitting: Internal Medicine

## 2013-11-14 ENCOUNTER — Ambulatory Visit (INDEPENDENT_AMBULATORY_CARE_PROVIDER_SITE_OTHER): Payer: 59 | Admitting: Internal Medicine

## 2013-11-14 VITALS — BP 150/82 | HR 76 | Temp 99.1°F | Resp 16 | Wt 160.0 lb

## 2013-11-14 DIAGNOSIS — K219 Gastro-esophageal reflux disease without esophagitis: Secondary | ICD-10-CM

## 2013-11-14 DIAGNOSIS — R609 Edema, unspecified: Secondary | ICD-10-CM

## 2013-11-14 DIAGNOSIS — E785 Hyperlipidemia, unspecified: Secondary | ICD-10-CM

## 2013-11-14 DIAGNOSIS — E559 Vitamin D deficiency, unspecified: Secondary | ICD-10-CM

## 2013-11-14 DIAGNOSIS — I4891 Unspecified atrial fibrillation: Secondary | ICD-10-CM

## 2013-11-14 DIAGNOSIS — E538 Deficiency of other specified B group vitamins: Secondary | ICD-10-CM

## 2013-11-14 NOTE — Progress Notes (Signed)
   Subjective:    HPI  C/o dry mouth C/o bad arthralgias due to Simvastatin - resolved off of it F/u LBP after lifting a box of cat litter - took Vicodin; better f/u abd bloating and pain on L side x 1 week - better F/u L knee pain - she saw Dr Noemi Chapel and had a shot, Prednisone taper, Pennsaid - better  The patient presents for a follow-up of  chronic hypertension, chronic dyslipidemia, A fib, tremor. She is taking Lipitor 1/2 tab qd  Her HR, BPs - OK at home  BP Readings from Last 3 Encounters:  11/14/13 150/82  11/09/13 147/69  10/06/13 142/80   Wt Readings from Last 3 Encounters:  11/14/13 160 lb (72.576 kg)  11/09/13 160 lb (72.576 kg)  10/06/13 159 lb 2 oz (72.179 kg)      Review of Systems  Constitutional: Positive for fatigue. Negative for activity change, appetite change and unexpected weight change.  HENT: Negative for congestion, mouth sores and sinus pressure.   Eyes: Negative for visual disturbance.  Respiratory: Negative for chest tightness.   Gastrointestinal: Negative for nausea and abdominal pain.  Genitourinary: Negative for frequency, difficulty urinating and vaginal pain.  Musculoskeletal: Positive for arthralgias and back pain. Negative for gait problem.  Skin: Negative for pallor.  Neurological: Positive for tremors. Negative for dizziness, weakness and numbness.  Psychiatric/Behavioral: Negative for confusion and sleep disturbance.       Objective:   Physical Exam  Constitutional: She appears well-developed and well-nourished. No distress.  HENT:  Head: Normocephalic.  Right Ear: External ear normal.  Left Ear: External ear normal.  Nose: Nose normal.  Mouth/Throat: Oropharynx is clear and moist.  Eyes: Conjunctivae are normal. Pupils are equal, round, and reactive to light. Right eye exhibits no discharge. Left eye exhibits no discharge.  Neck: Normal range of motion. Neck supple. No JVD present. No tracheal deviation present. No thyromegaly  present.  Cardiovascular: Normal rate and normal heart sounds.   irreg irreg  Pulmonary/Chest: No stridor. No respiratory distress. She has no wheezes.  Abdominal: Soft. Bowel sounds are normal. She exhibits no distension and no mass. There is no tenderness. There is no rebound and no guarding.  Musculoskeletal: She exhibits no edema and no tenderness.  Lymphadenopathy:    She has no cervical adenopathy.  Neurological: She displays normal reflexes. No cranial nerve deficit. She exhibits normal muscle tone. Coordination normal.  Skin: No rash noted. No erythema.  Psychiatric: She has a normal mood and affect. Her behavior is normal. Judgment and thought content normal.  mild tremor LLQ is sensitive a little - no mass or rebound  Lab Results  Component Value Date   WBC 5.7 10/06/2013   HGB 14.2 10/06/2013   HCT 42.9 10/06/2013   PLT 267.0 10/06/2013   GLUCOSE 84 10/06/2013   CHOL 163 11/11/2012   TRIG 124.0 11/11/2012   HDL 35.90* 11/11/2012   LDLDIRECT 178.0 09/24/2011   LDLCALC 102* 11/11/2012   ALT 22 10/06/2013   AST 28 10/06/2013   NA 138 10/06/2013   K 4.3 10/06/2013   CL 104 10/06/2013   CREATININE 0.7 10/06/2013   BUN 8 10/06/2013   CO2 28 10/06/2013   TSH 2.84 11/11/2012   INR 2.3 11/07/2013          Assessment & Plan:

## 2013-11-14 NOTE — Patient Instructions (Signed)
Get a Waterpik combo

## 2013-11-14 NOTE — Assessment & Plan Note (Signed)
Continue with current prescription therapy as reflected on the Med list.  

## 2013-11-14 NOTE — Assessment & Plan Note (Signed)
Resolved

## 2013-11-14 NOTE — Progress Notes (Signed)
Pre visit review using our clinic review tool, if applicable. No additional management support is needed unless otherwise documented below in the visit note. 

## 2013-11-14 NOTE — Assessment & Plan Note (Signed)
Doing well 

## 2013-12-02 ENCOUNTER — Other Ambulatory Visit: Payer: Self-pay | Admitting: Internal Medicine

## 2013-12-04 ENCOUNTER — Other Ambulatory Visit: Payer: Self-pay | Admitting: General Practice

## 2013-12-04 MED ORDER — WARFARIN SODIUM 3 MG PO TABS: ORAL_TABLET | ORAL | Status: DC

## 2013-12-05 ENCOUNTER — Ambulatory Visit (INDEPENDENT_AMBULATORY_CARE_PROVIDER_SITE_OTHER): Payer: 59 | Admitting: General Practice

## 2013-12-05 DIAGNOSIS — I4891 Unspecified atrial fibrillation: Secondary | ICD-10-CM

## 2013-12-05 DIAGNOSIS — Z5181 Encounter for therapeutic drug level monitoring: Secondary | ICD-10-CM

## 2013-12-05 DIAGNOSIS — G459 Transient cerebral ischemic attack, unspecified: Secondary | ICD-10-CM

## 2013-12-05 LAB — POCT INR: INR: 2.6

## 2013-12-05 NOTE — Progress Notes (Signed)
Pre visit review using our clinic review tool, if applicable. No additional management support is needed unless otherwise documented below in the visit note. 

## 2014-01-02 ENCOUNTER — Ambulatory Visit (INDEPENDENT_AMBULATORY_CARE_PROVIDER_SITE_OTHER): Payer: 59 | Admitting: General Practice

## 2014-01-02 DIAGNOSIS — G459 Transient cerebral ischemic attack, unspecified: Secondary | ICD-10-CM

## 2014-01-02 DIAGNOSIS — Z5181 Encounter for therapeutic drug level monitoring: Secondary | ICD-10-CM

## 2014-01-02 DIAGNOSIS — I4891 Unspecified atrial fibrillation: Secondary | ICD-10-CM

## 2014-01-02 LAB — POCT INR: INR: 2.3

## 2014-01-02 NOTE — Patient Instructions (Signed)
6/3 - Hold coumadin  6/4 - Take 2 1/2 tablets that evening 6/5 - Resume normal dosage schedule

## 2014-01-02 NOTE — Progress Notes (Signed)
Pre visit review using our clinic review tool, if applicable. No additional management support is needed unless otherwise documented below in the visit note. 

## 2014-01-30 ENCOUNTER — Ambulatory Visit (INDEPENDENT_AMBULATORY_CARE_PROVIDER_SITE_OTHER): Payer: 59 | Admitting: General Practice

## 2014-01-30 DIAGNOSIS — Z5181 Encounter for therapeutic drug level monitoring: Secondary | ICD-10-CM

## 2014-01-30 DIAGNOSIS — I4891 Unspecified atrial fibrillation: Secondary | ICD-10-CM

## 2014-01-30 DIAGNOSIS — G459 Transient cerebral ischemic attack, unspecified: Secondary | ICD-10-CM

## 2014-01-30 LAB — POCT INR: INR: 1.9

## 2014-01-30 NOTE — Progress Notes (Signed)
Pre visit review using our clinic review tool, if applicable. No additional management support is needed unless otherwise documented below in the visit note. 

## 2014-02-07 ENCOUNTER — Other Ambulatory Visit (INDEPENDENT_AMBULATORY_CARE_PROVIDER_SITE_OTHER): Payer: 59

## 2014-02-07 DIAGNOSIS — I4891 Unspecified atrial fibrillation: Secondary | ICD-10-CM

## 2014-02-07 DIAGNOSIS — E538 Deficiency of other specified B group vitamins: Secondary | ICD-10-CM

## 2014-02-07 DIAGNOSIS — E785 Hyperlipidemia, unspecified: Secondary | ICD-10-CM

## 2014-02-07 LAB — CBC WITH DIFFERENTIAL/PLATELET
BASOS ABS: 0 10*3/uL (ref 0.0–0.1)
Basophils Relative: 1.4 % (ref 0.0–3.0)
Eosinophils Absolute: 0.1 10*3/uL (ref 0.0–0.7)
Eosinophils Relative: 2.3 % (ref 0.0–5.0)
HCT: 39.1 % (ref 36.0–46.0)
Hemoglobin: 13.2 g/dL (ref 12.0–15.0)
LYMPHS ABS: 1.6 10*3/uL (ref 0.7–4.0)
Lymphocytes Relative: 44.7 % (ref 12.0–46.0)
MCHC: 33.8 g/dL (ref 30.0–36.0)
MCV: 89.7 fl (ref 78.0–100.0)
MONOS PCT: 12.8 % — AB (ref 3.0–12.0)
Monocytes Absolute: 0.5 10*3/uL (ref 0.1–1.0)
Neutro Abs: 1.4 10*3/uL (ref 1.4–7.7)
Neutrophils Relative %: 38.8 % — ABNORMAL LOW (ref 43.0–77.0)
PLATELETS: 237 10*3/uL (ref 150.0–400.0)
RBC: 4.36 Mil/uL (ref 3.87–5.11)
RDW: 13.4 % (ref 11.5–15.5)
WBC: 3.5 10*3/uL — AB (ref 4.0–10.5)

## 2014-02-07 LAB — BASIC METABOLIC PANEL
BUN: 13 mg/dL (ref 6–23)
CALCIUM: 9 mg/dL (ref 8.4–10.5)
CO2: 28 meq/L (ref 19–32)
Chloride: 107 mEq/L (ref 96–112)
Creatinine, Ser: 0.8 mg/dL (ref 0.4–1.2)
GFR: 79.35 mL/min (ref >60.00)
Glucose, Bld: 88 mg/dL (ref 70–99)
Potassium: 4.4 mEq/L (ref 3.5–5.1)
Sodium: 141 mEq/L (ref 135–145)

## 2014-02-07 LAB — LIPID PANEL
CHOL/HDL RATIO: 6
Cholesterol: 246 mg/dL — ABNORMAL HIGH (ref 0–200)
HDL: 39.9 mg/dL (ref >39.00)
LDL CALC: 176 mg/dL — AB (ref 0–99)
NONHDL: 206.1
TRIGLYCERIDES: 152 mg/dL — AB (ref 0.0–149.0)
VLDL: 30.4 mg/dL (ref 0.0–40.0)

## 2014-02-07 LAB — HEPATIC FUNCTION PANEL
ALT: 18 U/L (ref 0–35)
AST: 29 U/L (ref 0–37)
Albumin: 3.8 g/dL (ref 3.5–5.2)
Alkaline Phosphatase: 58 U/L (ref 39–117)
BILIRUBIN DIRECT: 0.1 mg/dL (ref 0.0–0.3)
BILIRUBIN TOTAL: 0.8 mg/dL (ref 0.2–1.2)
Total Protein: 6.5 g/dL (ref 6.0–8.3)

## 2014-02-07 LAB — VITAMIN B12: Vitamin B-12: >1500 pg/mL — ABNORMAL HIGH (ref 211–911)

## 2014-02-07 LAB — TSH: TSH: 2.44 u[IU]/mL (ref 0.35–4.50)

## 2014-02-13 ENCOUNTER — Ambulatory Visit: Payer: 59 | Admitting: Internal Medicine

## 2014-02-13 ENCOUNTER — Encounter: Payer: Self-pay | Admitting: Internal Medicine

## 2014-02-13 ENCOUNTER — Ambulatory Visit (INDEPENDENT_AMBULATORY_CARE_PROVIDER_SITE_OTHER): Payer: 59 | Admitting: Internal Medicine

## 2014-02-13 VITALS — BP 140/76 | HR 68 | Temp 97.9°F | Resp 16 | Ht 65.5 in | Wt 163.0 lb

## 2014-02-13 DIAGNOSIS — Z0000 Encounter for general adult medical examination without abnormal findings: Secondary | ICD-10-CM

## 2014-02-13 DIAGNOSIS — R609 Edema, unspecified: Secondary | ICD-10-CM

## 2014-02-13 DIAGNOSIS — E559 Vitamin D deficiency, unspecified: Secondary | ICD-10-CM

## 2014-02-13 DIAGNOSIS — M545 Low back pain, unspecified: Secondary | ICD-10-CM

## 2014-02-13 DIAGNOSIS — E538 Deficiency of other specified B group vitamins: Secondary | ICD-10-CM

## 2014-02-13 DIAGNOSIS — I4891 Unspecified atrial fibrillation: Secondary | ICD-10-CM

## 2014-02-13 MED ORDER — KRILL OIL 300 MG PO CAPS: ORAL_CAPSULE | ORAL | Status: DC

## 2014-02-13 MED ORDER — FUROSEMIDE 20 MG PO TABS: 20.0000 mg | ORAL_TABLET | Freq: Every day | ORAL | Status: DC | PRN

## 2014-02-13 NOTE — Assessment & Plan Note (Signed)
Continue with current prescription therapy as reflected on the Med list.  

## 2014-02-13 NOTE — Progress Notes (Signed)
   Subjective:    HPI  The patient is here for a wellness exam. The patient has been doing well overall without major physical or psychological issues going on lately. F/u bad arthralgias due to Simvastatin - resolved off of it F/u LBP after lifting a box of cat litter - took Vicodin; better f/u abd bloating and pain on L side x 1 week - better F/u L knee pain - she saw Dr Noemi Chapel and had a shot, Prednisone taper, Pennsaid - better  The patient presents for a follow-up of  chronic hypertension, chronic dyslipidemia, A fib, tremor. She is taking Lipitor 1/2 tab qd  Her HR, BPs - OK at home  BP Readings from Last 3 Encounters:  02/13/14 140/76  11/14/13 150/82  11/09/13 147/69   Wt Readings from Last 3 Encounters:  02/13/14 163 lb (73.936 kg)  11/14/13 160 lb (72.576 kg)  11/09/13 160 lb (72.576 kg)      Review of Systems  Constitutional: Positive for fatigue. Negative for activity change, appetite change and unexpected weight change.  HENT: Negative for congestion, mouth sores and sinus pressure.   Eyes: Negative for visual disturbance.  Respiratory: Negative for chest tightness.   Gastrointestinal: Negative for nausea and abdominal pain.  Genitourinary: Negative for frequency, difficulty urinating and vaginal pain.  Musculoskeletal: Positive for arthralgias and back pain. Negative for gait problem.  Skin: Negative for pallor.  Neurological: Positive for tremors. Negative for dizziness, weakness and numbness.  Psychiatric/Behavioral: Negative for confusion and sleep disturbance.       Objective:   Physical Exam  Constitutional: She appears well-developed and well-nourished. No distress.  HENT:  Head: Normocephalic.  Right Ear: External ear normal.  Left Ear: External ear normal.  Nose: Nose normal.  Mouth/Throat: Oropharynx is clear and moist.  Eyes: Conjunctivae are normal. Pupils are equal, round, and reactive to light. Right eye exhibits no discharge. Left eye  exhibits no discharge.  Neck: Normal range of motion. Neck supple. No JVD present. No tracheal deviation present. No thyromegaly present.  Cardiovascular: Normal rate and normal heart sounds.   irreg irreg  Pulmonary/Chest: No stridor. No respiratory distress. She has no wheezes.  Abdominal: Soft. Bowel sounds are normal. She exhibits no distension and no mass. There is no tenderness. There is no rebound and no guarding.  Musculoskeletal: She exhibits no edema and no tenderness.  Lymphadenopathy:    She has no cervical adenopathy.  Neurological: She displays normal reflexes. No cranial nerve deficit. She exhibits normal muscle tone. Coordination normal.  Skin: No rash noted. No erythema.  Psychiatric: She has a normal mood and affect. Her behavior is normal. Judgment and thought content normal.  mild tremor LLQ is sensitive a little - no mass or rebound  Lab Results  Component Value Date   WBC 3.5* 02/07/2014   HGB 13.2 02/07/2014   HCT 39.1 02/07/2014   PLT 237.0 02/07/2014   GLUCOSE 88 02/07/2014   CHOL 246* 02/07/2014   TRIG 152.0* 02/07/2014   HDL 39.90 02/07/2014   LDLDIRECT 178.0 09/24/2011   LDLCALC 176* 02/07/2014   ALT 18 02/07/2014   AST 29 02/07/2014   NA 141 02/07/2014   K 4.4 02/07/2014   CL 107 02/07/2014   CREATININE 0.8 02/07/2014   BUN 13 02/07/2014   CO2 28 02/07/2014   TSH 2.44 02/07/2014   INR 1.9 01/30/2014          Assessment & Plan:

## 2014-02-13 NOTE — Assessment & Plan Note (Signed)
Here for medicare wellness/physical  Diet: heart healthy  Physical activity: not sedentary  Depression/mood screen: negative  Hearing: intact to whispered voice  Visual acuity: grossly normal, performs annual eye exam  ADLs: capable  Fall risk: none  Home safety: good  Cognitive evaluation: intact to orientation, naming, recall and repetition  EOL planning: adv directives, full code/ I agree  I have personally reviewed and have noted  1. The patient's medical and social history  2. Their use of alcohol, tobacco or illicit drugs  3. Their current medications and supplements  4. The patient's functional ability including ADL's, fall risks, home safety risks and hearing or visual impairment.  5. Diet and physical activities  6. Evidence for depression or mood disorders    Today patient counseled on age appropriate routine health concerns for screening and prevention, each reviewed and up to date or declined. Immunizations reviewed and up to date or declined. Labs ordered and reviewed. Risk factors for depression reviewed and negative. Hearing function and visual acuity are intact. ADLs screened and addressed as needed. Functional ability and level of safety reviewed and appropriate. Education, counseling and referrals performed based on assessed risks today. Patient provided with a copy of personalized plan for preventive services.

## 2014-02-13 NOTE — Assessment & Plan Note (Signed)
Doing well 

## 2014-02-13 NOTE — Progress Notes (Signed)
Pre visit review using our clinic review tool, if applicable. No additional management support is needed unless otherwise documented below in the visit note. 

## 2014-02-21 ENCOUNTER — Ambulatory Visit (HOSPITAL_COMMUNITY): Payer: Medicare Other | Attending: Orthopedic Surgery | Admitting: *Deleted

## 2014-02-21 ENCOUNTER — Other Ambulatory Visit (HOSPITAL_COMMUNITY): Payer: Self-pay | Admitting: *Deleted

## 2014-02-21 DIAGNOSIS — M79609 Pain in unspecified limb: Secondary | ICD-10-CM | POA: Diagnosis not present

## 2014-02-21 DIAGNOSIS — R609 Edema, unspecified: Secondary | ICD-10-CM | POA: Diagnosis present

## 2014-02-21 DIAGNOSIS — M79605 Pain in left leg: Secondary | ICD-10-CM

## 2014-02-21 DIAGNOSIS — M25569 Pain in unspecified knee: Secondary | ICD-10-CM | POA: Insufficient documentation

## 2014-02-21 NOTE — Progress Notes (Signed)
Venous duplex left lower extremity complete

## 2014-02-27 ENCOUNTER — Ambulatory Visit (INDEPENDENT_AMBULATORY_CARE_PROVIDER_SITE_OTHER): Payer: 59 | Admitting: Family Medicine

## 2014-02-27 DIAGNOSIS — G459 Transient cerebral ischemic attack, unspecified: Secondary | ICD-10-CM

## 2014-02-27 DIAGNOSIS — I4891 Unspecified atrial fibrillation: Secondary | ICD-10-CM

## 2014-02-27 DIAGNOSIS — Z5181 Encounter for therapeutic drug level monitoring: Secondary | ICD-10-CM

## 2014-02-27 LAB — POCT INR: INR: 1.3

## 2014-03-07 ENCOUNTER — Telehealth: Payer: Self-pay | Admitting: Internal Medicine

## 2014-03-07 NOTE — Telephone Encounter (Signed)
Rc'ed records from [facility] Forwarding 2pgs to Dr.[email or web address]

## 2014-03-20 ENCOUNTER — Ambulatory Visit (INDEPENDENT_AMBULATORY_CARE_PROVIDER_SITE_OTHER): Payer: 59 | Admitting: Family Medicine

## 2014-03-20 DIAGNOSIS — Z5181 Encounter for therapeutic drug level monitoring: Secondary | ICD-10-CM

## 2014-03-20 DIAGNOSIS — I4891 Unspecified atrial fibrillation: Secondary | ICD-10-CM

## 2014-03-20 DIAGNOSIS — G459 Transient cerebral ischemic attack, unspecified: Secondary | ICD-10-CM

## 2014-03-20 LAB — POCT INR: INR: 2.4

## 2014-03-28 ENCOUNTER — Other Ambulatory Visit: Payer: Self-pay | Admitting: Internal Medicine

## 2014-04-24 ENCOUNTER — Ambulatory Visit (INDEPENDENT_AMBULATORY_CARE_PROVIDER_SITE_OTHER): Payer: Medicare Other | Admitting: *Deleted

## 2014-04-24 DIAGNOSIS — G459 Transient cerebral ischemic attack, unspecified: Secondary | ICD-10-CM

## 2014-04-24 DIAGNOSIS — Z5181 Encounter for therapeutic drug level monitoring: Secondary | ICD-10-CM

## 2014-04-24 DIAGNOSIS — I4891 Unspecified atrial fibrillation: Secondary | ICD-10-CM

## 2014-04-24 LAB — POCT INR: INR: 2.7

## 2014-05-15 ENCOUNTER — Telehealth: Payer: Self-pay | Admitting: Internal Medicine

## 2014-05-15 NOTE — Telephone Encounter (Signed)
New message     Pt is having knee surgery and needs clearance.  Also can she stop her coumadin for a week prior to surgery?  Surgery is not scheduled until December however, she is on a list to call her if they get a cancellation earlier and she want to be cleared.

## 2014-05-16 ENCOUNTER — Telehealth: Payer: Self-pay | Admitting: Internal Medicine

## 2014-05-16 NOTE — Telephone Encounter (Signed)
Discussed with Dr Lovena Le she is low risk for surgery and may proceed.  She will need Lovenox bridging with a history of TIA.  I have left a message for patient with this information

## 2014-05-16 NOTE — Telephone Encounter (Signed)
Rec'd from [facility] forward 2 pages to [place]

## 2014-05-29 ENCOUNTER — Ambulatory Visit (INDEPENDENT_AMBULATORY_CARE_PROVIDER_SITE_OTHER): Payer: Medicare Other | Admitting: *Deleted

## 2014-05-29 DIAGNOSIS — G459 Transient cerebral ischemic attack, unspecified: Secondary | ICD-10-CM

## 2014-05-29 DIAGNOSIS — Z23 Encounter for immunization: Secondary | ICD-10-CM

## 2014-05-29 DIAGNOSIS — I4891 Unspecified atrial fibrillation: Secondary | ICD-10-CM

## 2014-05-29 DIAGNOSIS — Z5181 Encounter for therapeutic drug level monitoring: Secondary | ICD-10-CM

## 2014-05-29 LAB — POCT INR: INR: 2.5

## 2014-06-01 ENCOUNTER — Telehealth: Payer: Self-pay | Admitting: Internal Medicine

## 2014-06-01 DIAGNOSIS — I10 Essential (primary) hypertension: Secondary | ICD-10-CM

## 2014-06-01 NOTE — Telephone Encounter (Signed)
BMET Thx

## 2014-06-01 NOTE — Telephone Encounter (Signed)
Pt scheduled for Oct 28 appt. With Dr Alain Marion. Pt wants to know if she will need to go to the lab first or if Dr. Is requesting labs for this 4 mos f/u appt. Pt requests a message if she needs lab prior

## 2014-06-01 NOTE — Telephone Encounter (Signed)
Called pt no answer LMOM (cell) with md response. Lab has been placed...Alyssa Luna

## 2014-06-04 ENCOUNTER — Telehealth: Payer: Self-pay | Admitting: Internal Medicine

## 2014-06-04 NOTE — Telephone Encounter (Signed)
emmi emailed °

## 2014-06-06 ENCOUNTER — Other Ambulatory Visit (INDEPENDENT_AMBULATORY_CARE_PROVIDER_SITE_OTHER): Payer: 59

## 2014-06-06 DIAGNOSIS — I10 Essential (primary) hypertension: Secondary | ICD-10-CM

## 2014-06-06 LAB — BASIC METABOLIC PANEL
BUN: 14 mg/dL (ref 6–23)
CO2: 30 meq/L (ref 19–32)
Calcium: 9.2 mg/dL (ref 8.4–10.5)
Chloride: 106 mEq/L (ref 96–112)
Creatinine, Ser: 0.7 mg/dL (ref 0.4–1.2)
GFR: 81.79 mL/min (ref >60.00)
Glucose, Bld: 72 mg/dL (ref 70–99)
Potassium: 4.4 mEq/L (ref 3.5–5.1)
SODIUM: 142 meq/L (ref 135–145)

## 2014-06-13 ENCOUNTER — Ambulatory Visit (INDEPENDENT_AMBULATORY_CARE_PROVIDER_SITE_OTHER): Payer: 59 | Admitting: Internal Medicine

## 2014-06-13 ENCOUNTER — Encounter: Payer: Self-pay | Admitting: Internal Medicine

## 2014-06-13 VITALS — BP 150/88 | HR 60 | Temp 98.0°F | Wt 159.0 lb

## 2014-06-13 DIAGNOSIS — I482 Chronic atrial fibrillation, unspecified: Secondary | ICD-10-CM

## 2014-06-13 DIAGNOSIS — M5440 Lumbago with sciatica, unspecified side: Secondary | ICD-10-CM

## 2014-06-13 DIAGNOSIS — I10 Essential (primary) hypertension: Secondary | ICD-10-CM

## 2014-06-13 DIAGNOSIS — E785 Hyperlipidemia, unspecified: Secondary | ICD-10-CM

## 2014-06-13 DIAGNOSIS — Z01818 Encounter for other preprocedural examination: Secondary | ICD-10-CM

## 2014-06-13 DIAGNOSIS — F411 Generalized anxiety disorder: Secondary | ICD-10-CM | POA: Insufficient documentation

## 2014-06-13 MED ORDER — PRAVASTATIN SODIUM 20 MG PO TABS
20.0000 mg | ORAL_TABLET | Freq: Every day | ORAL | Status: DC
Start: 2014-06-13 — End: 2014-10-02

## 2014-06-13 MED ORDER — LORAZEPAM 0.5 MG PO TABS: 0.5000 mg | ORAL_TABLET | Freq: Two times a day (BID) | ORAL | Status: DC | PRN

## 2014-06-13 NOTE — Assessment & Plan Note (Signed)
Will try Pravastatin low dose if tolerated - see Rx Krill oil
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
## 2014-06-13 NOTE — Assessment & Plan Note (Signed)
Continue with current prescription therapy as reflected on the Med list.  

## 2014-06-13 NOTE — Assessment & Plan Note (Signed)
The pt is medically clear for her L TKR from medical standpoint assuming her pending routine pre-op work up is acceptable. Thank you!

## 2014-06-13 NOTE — Progress Notes (Signed)
Pre visit review using our clinic review tool, if applicable. No additional management support is needed unless otherwise documented below in the visit note. 
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
## 2014-06-27 ENCOUNTER — Other Ambulatory Visit: Payer: Self-pay | Admitting: Internal Medicine

## 2014-07-03 ENCOUNTER — Telehealth: Payer: Self-pay | Admitting: Internal Medicine

## 2014-07-03 ENCOUNTER — Ambulatory Visit (INDEPENDENT_AMBULATORY_CARE_PROVIDER_SITE_OTHER): Payer: 59 | Admitting: Internal Medicine

## 2014-07-03 VITALS — BP 120/60 | HR 58 | Ht 65.0 in | Wt 159.0 lb

## 2014-07-03 DIAGNOSIS — I10 Essential (primary) hypertension: Secondary | ICD-10-CM

## 2014-07-03 DIAGNOSIS — I4891 Unspecified atrial fibrillation: Secondary | ICD-10-CM

## 2014-07-03 DIAGNOSIS — Z01810 Encounter for preprocedural cardiovascular examination: Secondary | ICD-10-CM

## 2014-07-03 NOTE — Assessment & Plan Note (Signed)
She appears to be maintaining sinus rhythm very nicely on low-dose flecainide. She will continue her current medical therapy.

## 2014-07-03 NOTE — Telephone Encounter (Signed)
Rec'd from [facility] forward 2 pages to Dr. Alain Marion
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
## 2014-07-03 NOTE — Patient Instructions (Addendum)
Your physician wants you to follow-up in: 6 month with Dr. Lovena Le. You will receive a reminder letter in the mail two months in advance. If you don't receive a letter, please call our office to schedule the follow-up appointment.   Cleared for surgery in January. Will need a Lovenox Bridge. Elberta Leatherwood, Pharm D will handle.

## 2014-07-03 NOTE — Assessment & Plan Note (Signed)
The patient is considering knee replacement surgery in a couple of months. At this point she would be considered low risk for major cardiovascular complications from the stress of surgery. Because she has a remote history of a TIA, she will need to undergo bridging therapy prior to knee replacement surgery. I will refer the patient to our Coumadin clinic where she can be treated with Lovenox prior to surgery.

## 2014-07-03 NOTE — Assessment & Plan Note (Signed)
Her blood pressure is well controlled. She will continue her current medical therapy and maintain a low-sodium diet.

## 2014-07-10 ENCOUNTER — Ambulatory Visit (INDEPENDENT_AMBULATORY_CARE_PROVIDER_SITE_OTHER): Payer: 59 | Admitting: Certified Registered Nurse Anesthetist

## 2014-07-10 DIAGNOSIS — G459 Transient cerebral ischemic attack, unspecified: Secondary | ICD-10-CM

## 2014-07-10 DIAGNOSIS — Z5181 Encounter for therapeutic drug level monitoring: Secondary | ICD-10-CM

## 2014-07-10 DIAGNOSIS — I4891 Unspecified atrial fibrillation: Secondary | ICD-10-CM

## 2014-07-10 LAB — POCT INR: INR: 2.2

## 2014-07-31 ENCOUNTER — Telehealth: Payer: Self-pay

## 2014-07-31 NOTE — Telephone Encounter (Signed)
Called patient x 2 to discuss medication adherence with pravastatin//LMOVM to call back to discuss if refills are needed.

## 2014-07-31 NOTE — Telephone Encounter (Signed)
Alyssa Luna (Nov 11, 2033)  Spoke to patient- was not a good time; prefers a call late afternoon  Pravastatin 20 mg o Last filled #30 on 06-13-14  ACTION ITEMS: o Contact patient to empower her to refill her medication and take as prescribed.

## 2014-08-07 ENCOUNTER — Encounter (HOSPITAL_COMMUNITY): Payer: Self-pay | Admitting: Physician Assistant

## 2014-08-07 DIAGNOSIS — K219 Gastro-esophageal reflux disease without esophagitis: Secondary | ICD-10-CM | POA: Diagnosis present

## 2014-08-07 DIAGNOSIS — C50919 Malignant neoplasm of unspecified site of unspecified female breast: Secondary | ICD-10-CM | POA: Diagnosis present

## 2014-08-07 DIAGNOSIS — M1712 Unilateral primary osteoarthritis, left knee: Secondary | ICD-10-CM | POA: Diagnosis present

## 2014-08-07 NOTE — H&P (Signed)
TOTAL KNEE ADMISSION H&P  Patient is being admitted for left total knee arthroplasty.  Subjective:  Chief Complaint:left knee pain.  HPI: Alyssa Luna, 80 y.o. female, has a history of pain and functional disability in the left knee due to arthritis and has failed non-surgical conservative treatments for greater than 12 weeks to includeNSAID's and/or analgesics, corticosteriod injections, viscosupplementation injections, flexibility and strengthening excercises, use of assistive devices, weight reduction as appropriate and activity modification.  Onset of symptoms was gradual, starting 10 years ago with gradually worsening course since that time. The patient noted no past surgery on the left knee(s).  Patient currently rates pain in the left knee(s) at 10 out of 10 with activity. Patient has night pain, worsening of pain with activity and weight bearing, pain that interferes with activities of daily living, crepitus and joint swelling.  Patient has evidence of subchondral sclerosis, periarticular osteophytes and joint space narrowing by imaging studies. There is no active infection.  Patient Active Problem List   Diagnosis Date Noted  . Primary localized osteoarthritis of left knee   . LBP (low back pain)   . Breast cancer   . GERD (gastroesophageal reflux disease)   . Preop exam for internal medicine 06/13/2014  . Anxiety state 06/13/2014  . Bradycardia 11/09/2013  . Essential hypertension 11/09/2013  . Well adult exam 05/21/2013  . Preop cardiovascular exam 05/08/2013  . Essential tremor 12/14/2012  . Right hip pain 05/10/2012  . Vertigo 03/09/2012  . Dyslipidemia 03/31/2011  . Meningioma 02/03/2011  . TIA (transient ischemic attack) 11/13/2010  . Abnormal CT of brain 11/13/2010  . CAROTID BRUIT 07/28/2010  . Edema 05/02/2010  . Atrial fibrillation 04/08/2010  . SYNCOPE 03/31/2010  . LOW BACK PAIN 06/05/2009  . Chronic maxillary sinusitis 01/31/2009  . EFFUSION OF JOINT  OTHER SPECIFIED SITE 01/31/2009  . Diarrhea 05/31/2008  . ABNORMAL CHEST XRAY 05/15/2008  . HEMATURIA, MICROSCOPIC, HX OF 08/04/2007  . VITAMIN B12 DEFICIENCY 03/21/2007  . VITAMIN D DEFICIENCY 03/21/2007  . Disease of tricuspid valve 03/21/2007  . GERD 03/21/2007  . DIVERTICULOSIS, COLON 03/21/2007  . OSTEOARTHRITIS 03/21/2007  . OSTEOPOROSIS 03/21/2007  . Personal history of malignant neoplasm of breast 03/21/2007  . COLONIC POLYPS, HX OF 03/21/2007   Past Medical History  Diagnosis Date  . Colon polyps   . Diverticulosis of colon   . GERD (gastroesophageal reflux disease)   . Osteoarthritis   . Osteoporosis   . TR (tricuspid regurgitation)     Mild  . Vitamin B12 deficiency   . Vitamin D deficiency   . History of breast cancer   . Hyperlipidemia   . Familial tremor   . LBP (low back pain)   . Cataract   . Atrial fibrillation     D [place]  . TIA (transient ischemic attack)   . Hemorrhoids   . Breast cancer 1984  . Primary localized osteoarthritis of left knee     Past Surgical History  Procedure Laterality Date  . Breast enhancement surgery    . Cataract extraction    . Total knee arthroplasty  Dec 2011    Right - Dr Noemi Chapel  . Bilateral mastectomy    . Polypectomy    . Colonoscopy    . Vaginal hysterectomy      [facility] BSO  . Oophorectomy      BSO  . Breast surgery      Bilateral mastectomy  . Augmentation mammaplasty      No prescriptions prior  to admission   No current facility-administered medications for this encounter. Current outpatient prescriptions: acetaminophen (TYLENOL) 500 MG tablet, Take 325 mg by mouth as needed. , Disp: , Rfl: ;  carvedilol (COREG) 3.125 MG tablet, Take 1 tablet (3.125 mg total) by mouth 2 (two) times daily., Disp: 180 tablet, Rfl: 3;  Cholecalciferol 1000 UNITS tablet, Take 1,000 Units by mouth daily. D-3, Disp: , Rfl: ;  Cyanocobalamin (VITAMIN B-12 CR) 1000 MCG TBCR, Take by mouth daily.  , Disp: , Rfl:  flecainide (TAMBOCOR)  50 MG tablet, Take 1 and 1/2 tablets by  mouth two times daily, Disp: 270 tablet, Rfl: 0;  Krill Oil 300 MG CAPS, 1 po qd, Disp: 100 capsule, Rfl: 3;  LORazepam (ATIVAN) 0.5 MG tablet, Take 1 tablet (0.5 mg total) by mouth 2 (two) times daily as needed for anxiety or sleep., Disp: 60 tablet, Rfl: 0;  omeprazole (PRILOSEC) 20 MG capsule, Take 20 mg by mouth daily.  , Disp: , Rfl:  warfarin (COUMADIN) 3 MG tablet, Take as directed by the coumadin clinic., Disp: 180 tablet, Rfl: 3;  furosemide (LASIX) 20 MG tablet, Take 1-2 tablets (20-40 mg total) by mouth daily as needed for edema., Disp: 60 tablet, Rfl: 2;  pravastatin (PRAVACHOL) 20 MG tablet, Take 1 tablet (20 mg total) by mouth daily., Disp: 30 tablet, Rfl: 5   Allergies  Allergen Reactions  . Ciprofloxacin     GI upset   . Simvastatin     REACTION: leg cramps, weakness    History  Substance Use Topics  . Smoking status: Never Smoker   . Smokeless tobacco: Not on file  . Alcohol Use: No    Family History  Problem Relation Age of Onset  . Early death Neg Hx   . Stroke Neg Hx   . Breast cancer Mother 25  . Tremor Mother   . Tremor Brother   . Colon cancer Maternal Aunt   . Ovarian cancer Maternal Grandmother      Review of Systems  Constitutional: Negative.   HENT: Negative.   Eyes: Negative.   Cardiovascular: Negative.   Gastrointestinal: Negative.   Genitourinary: Negative.   Musculoskeletal: Positive for back pain and joint pain.  Skin: Negative.   Neurological: Negative.   Endo/Heme/Allergies: Negative for environmental allergies and polydipsia. Bruises/bleeds easily.  Psychiatric/Behavioral: Negative.     Objective:  Physical Exam  Constitutional: She is oriented to person, place, and time. She appears well-developed and well-nourished.  HENT:  Head: Normocephalic and atraumatic.  Eyes: Conjunctivae and EOM are normal. Pupils are equal, round, and reactive to light.  Neck: Neck supple.  Cardiovascular: Normal  rate and regular rhythm.   Murmur heard. Respiratory: Effort normal.  GI: Soft.  Genitourinary:  Not pertinent to current symptomatology therefore not examined.  Musculoskeletal:  Examination of her left knee reveals 3+ effusion diffuse pain mild valgus deformity range of motion -5 to 120 degrees knee is stable. Exam of the right knee reveals well healed total knee incision without swelling or pain, full range of motion knee is stable with normal patella tracking. Vascular exam: pulses 2+ and symmetric.  Neurological: She is alert and oriented to person, place, and time.  Skin: Skin is warm and dry.  Psychiatric: She has a normal mood and affect. Her behavior is normal.    Vital signs in last 24 hours: Temp:  [97.6 F (36.4 C)] 97.6 F (36.4 C) (12/22 1100) Pulse Rate:  [55] 55 (12/22 1100) BP: (158)/(76) 158/76  mmHg (12/22 1100) SpO2:  [97 %] 97 % (12/22 1100) Weight:  [73.029 kg (161 lb)] 73.029 kg (161 lb) (12/22 1100)  Labs:   Estimated body mass index is 29.44 kg/(m^2) as calculated from the following:   Height as of this encounter: 5' 2"  (1.575 m).   Weight as of this encounter: 73.029 kg (161 lb).   Imaging Review Plain radiographs demonstrate severe degenerative joint disease of the left knee(s). The overall alignment issignificant varus. The bone quality appears to be good for age and reported activity level.  Assessment/Plan:  End stage arthritis, left knee  Principal Problem:   Primary localized osteoarthritis of left knee Active Problems:   Disease of tricuspid valve   Atrial fibrillation   Anxiety state   LBP (low back pain)   Breast cancer   GERD (gastroesophageal reflux disease)   The patient history, physical examination, clinical judgment of the provider and imaging studies are consistent with end stage degenerative joint disease of the left knee(s) and total knee arthroplasty is deemed medically necessary. The treatment options including medical  management, injection therapy arthroscopy and arthroplasty were discussed at length. The risks and benefits of total knee arthroplasty were presented and reviewed. The risks due to aseptic loosening, infection, stiffness, patella tracking problems, thromboembolic complications and other imponderables were discussed. The patient acknowledged the explanation, agreed to proceed with the plan and consent was signed. Patient is being admitted for inpatient treatment for surgery, pain control, PT, OT, prophylactic antibiotics, VTE prophylaxis, progressive ambulation and ADL's and discharge planning. The patient is planning to be discharged to skilled nursing facility [facility] vs [place]. Kaleen Mask Physician Assistant [facility] [phone]  08/07/2014, 12:15 PM

## 2014-08-14 ENCOUNTER — Ambulatory Visit (INDEPENDENT_AMBULATORY_CARE_PROVIDER_SITE_OTHER): Payer: Medicare Other | Admitting: *Deleted

## 2014-08-14 ENCOUNTER — Other Ambulatory Visit: Payer: Self-pay | Admitting: Family Medicine

## 2014-08-14 ENCOUNTER — Ambulatory Visit (INDEPENDENT_AMBULATORY_CARE_PROVIDER_SITE_OTHER): Payer: 59 | Admitting: Family Medicine

## 2014-08-14 DIAGNOSIS — I4891 Unspecified atrial fibrillation: Secondary | ICD-10-CM

## 2014-08-14 DIAGNOSIS — Z5181 Encounter for therapeutic drug level monitoring: Secondary | ICD-10-CM

## 2014-08-14 DIAGNOSIS — Z23 Encounter for immunization: Secondary | ICD-10-CM

## 2014-08-14 DIAGNOSIS — G459 Transient cerebral ischemic attack, unspecified: Secondary | ICD-10-CM

## 2014-08-14 LAB — POCT INR: INR: 2.2

## 2014-08-14 MED ORDER — ENOXAPARIN SODIUM 80 MG/0.8ML ~~LOC~~ SOLN: 80.0000 mg | Freq: Two times a day (BID) | SUBCUTANEOUS | Status: DC

## 2014-08-14 NOTE — Patient Instructions (Signed)
Lovenox bridge instructions  1/6-Take your last dose of Coumadin 1/7- NO Coumadin and NO Lovenox injection 1/8- No Coumadin, Take Lovenox injection 1/9- No Coumadin, Take Lovenox injection 1/10- Take AM dose of Lovenox injection ONLY 1/11- PROCEDURE DAY (NO Coumadin, NO Lovenox)

## 2014-08-16 ENCOUNTER — Encounter (HOSPITAL_COMMUNITY)
Admission: RE | Admit: 2014-08-16 | Discharge: 2014-08-16 | Disposition: A | Discharge status: Home / Self Care | Payer: Medicare Other | Source: Ambulatory Visit | Attending: Orthopedic Surgery | Admitting: Orthopedic Surgery

## 2014-08-16 ENCOUNTER — Encounter (HOSPITAL_COMMUNITY): Payer: Self-pay

## 2014-08-16 HISTORY — DX: Essential (primary) hypertension: I10

## 2014-08-16 HISTORY — DX: Personal history of other diseases of the digestive system: Z87.19

## 2014-08-16 HISTORY — DX: Cardiac arrhythmia, unspecified: I49.9

## 2014-08-16 HISTORY — DX: Anxiety disorder, unspecified: F41.9

## 2014-08-16 HISTORY — DX: Myoneural disorder, unspecified: G70.9

## 2014-08-16 LAB — URINALYSIS, ROUTINE W REFLEX MICROSCOPIC
Bilirubin Urine: NEGATIVE
GLUCOSE, UA: NEGATIVE mg/dL
Hgb urine dipstick: NEGATIVE
Ketones, ur: NEGATIVE mg/dL
Leukocytes, UA: NEGATIVE
Nitrite: NEGATIVE
Protein, ur: NEGATIVE mg/dL
SPECIFIC GRAVITY, URINE: 1.018 (ref 1.005–1.030)
Urobilinogen, UA: 0.2 mg/dL (ref 0.0–1.0)
pH: 7 (ref 5.0–8.0)

## 2014-08-16 LAB — CBC WITH DIFFERENTIAL/PLATELET
BASOS ABS: 0 10*3/uL (ref 0.0–0.1)
BASOS PCT: 1 % (ref 0–1)
Eosinophils Absolute: 0.1 10*3/uL (ref 0.0–0.7)
Eosinophils Relative: 3 % (ref 0–5)
HEMATOCRIT: 41.9 % (ref 36.0–46.0)
HEMOGLOBIN: 13.8 g/dL (ref 12.0–15.0)
LYMPHS PCT: 36 % (ref 12–46)
Lymphs Abs: 1.6 10*3/uL (ref 0.7–4.0)
MCH: 29.8 pg (ref 26.0–34.0)
MCHC: 32.9 g/dL (ref 30.0–36.0)
MCV: 90.5 fL (ref 78.0–100.0)
MONO ABS: 0.4 10*3/uL (ref 0.1–1.0)
MONOS PCT: 8 % (ref 3–12)
NEUTROS ABS: 2.4 10*3/uL (ref 1.7–7.7)
Neutrophils Relative %: 52 % (ref 43–77)
Platelets: 237 10*3/uL (ref 150–400)
RBC: 4.63 MIL/uL (ref 3.87–5.11)
RDW: 13.2 % (ref 11.5–15.5)
WBC: 4.5 10*3/uL (ref 4.0–10.5)

## 2014-08-16 LAB — COMPREHENSIVE METABOLIC PANEL
ALBUMIN: 3.8 g/dL (ref 3.5–5.2)
ALK PHOS: 62 U/L (ref 39–117)
ALT: 22 U/L (ref 0–35)
AST: 27 U/L (ref 0–37)
Anion gap: 6 (ref 5–15)
BILIRUBIN TOTAL: 0.5 mg/dL (ref 0.3–1.2)
BUN: 12 mg/dL (ref 6–23)
CO2: 27 mmol/L (ref 19–32)
Calcium: 9.2 mg/dL (ref 8.4–10.5)
Chloride: 107 mEq/L (ref 96–112)
Creatinine, Ser: 0.84 mg/dL (ref 0.50–1.10)
GFR calc Af Amer: 75 mL/min — ABNORMAL LOW (ref >90)
GFR calc non Af Amer: 65 mL/min — ABNORMAL LOW (ref >90)
Glucose, Bld: 91 mg/dL (ref 70–99)
POTASSIUM: 3.9 mmol/L (ref 3.5–5.1)
SODIUM: 140 mmol/L (ref 135–145)
TOTAL PROTEIN: 7 g/dL (ref 6.0–8.3)

## 2014-08-16 LAB — PROTIME-INR
INR: 2.02 — ABNORMAL HIGH (ref 0.00–1.49)
Prothrombin Time: 23.1 seconds — ABNORMAL HIGH (ref 11.6–15.2)

## 2014-08-16 LAB — APTT: aPTT: 36 seconds (ref 24–37)

## 2014-08-16 LAB — TYPE AND SCREEN
ABO/RH(D): AB POS
Antibody Screen: NEGATIVE

## 2014-08-16 LAB — SURGICAL PCR SCREEN
MRSA, PCR: NEGATIVE
Staphylococcus aureus: NEGATIVE

## 2014-08-16 NOTE — Pre-Procedure Instructions (Signed)
Alyssa Luna  08/16/2014   Your procedure is scheduled on:  08/27/2014  Report to [facility] Admitting    ENTRANCE  A at 5:30 AM.  Call this number if you have problems the morning of surgery: [phone]   Remember:   Do not eat food or drink liquids after midnight.  On Sunday   Take these medicines the morning of surgery with A SIP OF WATER: Carvedilol, Omeprazole, Flecainide, and you also can take Tylenol & /or Lorazepam   Do not wear jewelry, make-up or nail polish.   Do not wear lotions, powders, or perfumes. You may wear deodorant.   Do not shave 48 hours prior to surgery.    Do not bring valuables to the hospital.  [facility] is not responsible                  for any belongings or valuables.                Contacts, dentures or bridgework may not be worn into surgery.    Leave suitcase in the car. After surgery it may be brought to your room.  For patients admitted to the hospital, discharge time is determined by your   treatment team.                Patients discharged the day of surgery will not be allowed to drive  home.  Name and phone number of your driver:with [employer]  Special Instructions: Special Instructions:  - Preparing for Surgery  Before surgery, you can play an important role.  Because skin is not sterile, your skin needs to be as free of germs as possible.  You can reduce the number of germs on you skin by washing with CHG (chlorahexidine gluconate) soap before surgery.  CHG is an antiseptic cleaner which kills germs and bonds with the skin to continue killing germs even after washing.  Please DO NOT use if you have an allergy to CHG or antibacterial soaps.  If your skin becomes reddened/irritated stop using the CHG and inform your nurse when you arrive at Short Stay.  Do not shave (including legs and underarms) for at least 48 hours prior to the first CHG shower.  You may shave your face.  Please follow these instructions  carefully:   1.  Shower with CHG Soap the night before surgery and the  morning of Surgery.  2.  If you choose to wash your hair, wash your hair first as usual with your  normal shampoo.  3.  After you shampoo, rinse your hair and body thoroughly to remove the  Shampoo.  4.  Use CHG as you would any other liquid soap.  You can apply chg directly to the skin and wash gently with scrungie or a clean washcloth.  5.  Apply the CHG Soap to your body ONLY FROM THE NECK DOWN.    Do not use on open wounds or open sores.  Avoid contact with your eyes, ears, mouth and genitals (private parts).  Wash genitals (private parts)   with your normal soap.  6.  Wash thoroughly, paying special attention to the area where your surgery will be performed.  7.  Thoroughly rinse your body with warm water from the neck down.  8.  DO NOT shower/wash with your normal soap after using and rinsing off   the CHG Soap.  9.  Pat yourself dry with a clean towel.  10.  Wear clean pajamas.            11.  Place clean sheets on your bed the night of your first shower and do not sleep with pets.  Day of Surgery  Do not apply any lotions/deodorants the morning of surgery.  Please wear clean clothes to the hospital/surgery center.   Please read over the following fact sheets that you were given: Pain Booklet, Coughing and Deep Breathing, Blood Transfusion Information, Total Joint Packet, MRSA Information and Surgical Site Infection Prevention

## 2014-08-17 LAB — ABO/RH: ABO/RH(D): AB POS

## 2014-08-19 LAB — URINE CULTURE
COLONY COUNT: NO GROWTH
Culture: NO GROWTH

## 2014-08-20 ENCOUNTER — Telehealth: Payer: Self-pay | Admitting: Internal Medicine

## 2014-08-20 MED ORDER — ENOXAPARIN SODIUM 80 MG/0.8ML ~~LOC~~ SOLN: 80.0000 mg | Freq: Two times a day (BID) | SUBCUTANEOUS | Status: DC

## 2014-08-20 NOTE — Progress Notes (Signed)
Anesthesia Chart Review:  Patient is a 81 year old female scheduled for left TKR on 1/11/6 by Dr. Noemi Chapel.  History includes non-smoker, HTN, afib/PAF, mild-moderate TR '11, TIA, hiatal hernia, GERD, anxiety, essential tremor, breast cancer s/p bilateral mastectomy with breast augmentation, right TKA '11. PCP is Dr. Alain Marion.  Cardiologist is Dr. Lovena Le.  Per his 07/03/14 note, "The patient is considering knee replacement surgery in a couple of months. At this point she would be considered low risk for major cardiovascular complications from the stress of surgery. Because she has a remote history of a TIA, she will need to undergo bridging therapy prior to knee replacement surgery. I will refer the patient to our Coumadin clinic where she can be treated with Lovenox prior to surgery."  EKG 07/03/14: SB at 58 bpm.  04/01/10 echo: - Left ventricle: Systolic function was normal. The estimated  ejection fraction was in the range of 55% to 60%. Wall motion was  normal; there were no regional wall motion abnormalities. - Left atrium: The atrium was mildly dilated. - Right atrium: The atrium was mildly to moderately dilated. - Tricuspid valve: Mild-moderate regurgitation.  Normal nuclear study with anterior breast attenuation, EF 79% on 06/12/09.  Preoperative labs noted.  Will plan to get a repeat PT/PTT on arrival.  George Hugh [facility] Short Stay Center/Anesthesiology Phone [phone] 08/20/2014 2:42 PM

## 2014-08-20 NOTE — Telephone Encounter (Signed)
Pt called in, meds were called in [facility] of [facility] in [address].   enoxaparin ([place]) 80 MG/0.8ML injection [[id number]]   [facility]

## 2014-08-20 NOTE — Telephone Encounter (Signed)
Notified [facility].../[facility]

## 2014-08-22 ENCOUNTER — Telehealth: Payer: Self-pay | Admitting: Internal Medicine

## 2014-08-22 NOTE — Telephone Encounter (Signed)
Spoke with pt and reminded her that she will be given Lovenox during her hospital stay after surgery so RX that was sent in was only for her to take at home before her procedure as directed.  She stated that she only received 4 L[facility] syringes [facility] instead of [facility].  I called [facility] to let them know RX was sent in for 5 syringes, they agreed and will give pt the additional syringe that was left out.

## 2014-08-22 NOTE — Telephone Encounter (Signed)
Patient has questions regarding lovenox and coumadin. Per her directions , she is to only take lovenox on the 8th, 9trh, and 10th. She states that she has never done this without the coumadin and does not have enough coumadin in her most recent RX to take both. Please contact the patient to clarify

## 2014-08-26 MED ORDER — CEFAZOLIN SODIUM-DEXTROSE 2-3 GM-% IV SOLR
2.0000 g | INTRAVENOUS | Status: AC
  Administered 2014-08-27: 2 g via INTRAVENOUS
  Filled 2014-08-26: qty 50

## 2014-08-27 ENCOUNTER — Inpatient Hospital Stay (HOSPITAL_COMMUNITY): Payer: Medicare Other | Admitting: Anesthesiology

## 2014-08-27 ENCOUNTER — Encounter (HOSPITAL_COMMUNITY)
Admission: RE | Disposition: A | Discharge status: Home / Self Care | Payer: Self-pay | Source: Ambulatory Visit | Attending: Orthopedic Surgery

## 2014-08-27 ENCOUNTER — Inpatient Hospital Stay (HOSPITAL_COMMUNITY)
Admission: RE | Admit: 2014-08-27 | Discharge: 2014-08-28 | DRG: 470 | Disposition: A | Discharge status: Home / Self Care | Payer: Medicare Other | Source: Ambulatory Visit | Attending: Orthopedic Surgery | Admitting: Orthopedic Surgery

## 2014-08-27 ENCOUNTER — Inpatient Hospital Stay (HOSPITAL_COMMUNITY): Payer: Medicare Other | Admitting: Vascular Surgery

## 2014-08-27 DIAGNOSIS — K219 Gastro-esophageal reflux disease without esophagitis: Secondary | ICD-10-CM | POA: Diagnosis present

## 2014-08-27 DIAGNOSIS — M179 Osteoarthritis of knee, unspecified: Secondary | ICD-10-CM | POA: Diagnosis present

## 2014-08-27 DIAGNOSIS — Z9013 Acquired absence of bilateral breasts and nipples: Secondary | ICD-10-CM | POA: Diagnosis present

## 2014-08-27 DIAGNOSIS — J320 Chronic maxillary sinusitis: Secondary | ICD-10-CM | POA: Diagnosis present

## 2014-08-27 DIAGNOSIS — M810 Age-related osteoporosis without current pathological fracture: Secondary | ICD-10-CM | POA: Diagnosis present

## 2014-08-27 DIAGNOSIS — Z8673 Personal history of transient ischemic attack (TIA), and cerebral infarction without residual deficits: Secondary | ICD-10-CM

## 2014-08-27 DIAGNOSIS — M1712 Unilateral primary osteoarthritis, left knee: Secondary | ICD-10-CM | POA: Diagnosis present

## 2014-08-27 DIAGNOSIS — Z853 Personal history of malignant neoplasm of breast: Secondary | ICD-10-CM | POA: Diagnosis not present

## 2014-08-27 DIAGNOSIS — F411 Generalized anxiety disorder: Secondary | ICD-10-CM | POA: Diagnosis present

## 2014-08-27 DIAGNOSIS — Z86010 Personal history of colonic polyps: Secondary | ICD-10-CM | POA: Diagnosis not present

## 2014-08-27 DIAGNOSIS — E538 Deficiency of other specified B group vitamins: Secondary | ICD-10-CM | POA: Diagnosis present

## 2014-08-27 DIAGNOSIS — M1710 Unilateral primary osteoarthritis, unspecified knee: Secondary | ICD-10-CM | POA: Diagnosis present

## 2014-08-27 DIAGNOSIS — I4891 Unspecified atrial fibrillation: Secondary | ICD-10-CM | POA: Diagnosis present

## 2014-08-27 DIAGNOSIS — E785 Hyperlipidemia, unspecified: Secondary | ICD-10-CM | POA: Diagnosis present

## 2014-08-27 DIAGNOSIS — C50919 Malignant neoplasm of unspecified site of unspecified female breast: Secondary | ICD-10-CM | POA: Diagnosis present

## 2014-08-27 DIAGNOSIS — I10 Essential (primary) hypertension: Secondary | ICD-10-CM | POA: Diagnosis present

## 2014-08-27 DIAGNOSIS — E559 Vitamin D deficiency, unspecified: Secondary | ICD-10-CM | POA: Diagnosis present

## 2014-08-27 DIAGNOSIS — I079 Rheumatic tricuspid valve disease, unspecified: Secondary | ICD-10-CM | POA: Diagnosis present

## 2014-08-27 DIAGNOSIS — G250 Essential tremor: Secondary | ICD-10-CM | POA: Diagnosis present

## 2014-08-27 DIAGNOSIS — I071 Rheumatic tricuspid insufficiency: Secondary | ICD-10-CM | POA: Diagnosis present

## 2014-08-27 DIAGNOSIS — F419 Anxiety disorder, unspecified: Secondary | ICD-10-CM | POA: Diagnosis present

## 2014-08-27 HISTORY — DX: Unilateral primary osteoarthritis, left knee: M17.12

## 2014-08-27 HISTORY — PX: TOTAL KNEE ARTHROPLASTY: SHX125

## 2014-08-27 LAB — CBC
HEMATOCRIT: 35.5 % — AB (ref 36.0–46.0)
HEMOGLOBIN: 12 g/dL (ref 12.0–15.0)
MCH: 30.4 pg (ref 26.0–34.0)
MCHC: 33.8 g/dL (ref 30.0–36.0)
MCV: 89.9 fL (ref 78.0–100.0)
Platelets: 212 10*3/uL (ref 150–400)
RBC: 3.95 MIL/uL (ref 3.87–5.11)
RDW: 13.1 % (ref 11.5–15.5)
WBC: 8.4 10*3/uL (ref 4.0–10.5)

## 2014-08-27 LAB — APTT: APTT: 32 s (ref 24–37)

## 2014-08-27 LAB — CREATININE, SERUM
Creatinine, Ser: 0.63 mg/dL (ref 0.50–1.10)
GFR calc non Af Amer: 84 mL/min — ABNORMAL LOW (ref >90)

## 2014-08-27 LAB — PROTIME-INR
INR: 1.19 (ref 0.00–1.49)
PROTHROMBIN TIME: 15.2 s (ref 11.6–15.2)

## 2014-08-27 SURGERY — ARTHROPLASTY, KNEE, TOTAL
Anesthesia: Spinal | Site: Knee | Laterality: Left

## 2014-08-27 MED ORDER — FENTANYL CITRATE 0.05 MG/ML IJ SOLN
INTRAMUSCULAR | Status: AC
  Filled 2014-08-27: qty 5

## 2014-08-27 MED ORDER — PANTOPRAZOLE SODIUM 40 MG PO TBEC
40.0000 mg | DELAYED_RELEASE_TABLET | Freq: Every day | ORAL | Status: DC
  Administered 2014-08-28: 40 mg via ORAL
  Filled 2014-08-27: qty 1

## 2014-08-27 MED ORDER — ROCURONIUM BROMIDE 50 MG/5ML IV SOLN
INTRAVENOUS | Status: AC
  Filled 2014-08-27: qty 1

## 2014-08-27 MED ORDER — WARFARIN - PHARMACIST DOSING INPATIENT
Freq: Every day | Status: DC
  Administered 2014-08-27: 1

## 2014-08-27 MED ORDER — POLYETHYLENE GLYCOL 3350 17 G PO PACK
17.0000 g | PACK | Freq: Two times a day (BID) | ORAL | Status: DC
  Administered 2014-08-27 – 2014-08-28 (×2): 17 g via ORAL
  Filled 2014-08-27 (×3): qty 1

## 2014-08-27 MED ORDER — CELECOXIB 200 MG PO CAPS
200.0000 mg | ORAL_CAPSULE | Freq: Two times a day (BID) | ORAL | Status: DC
  Administered 2014-08-27 – 2014-08-28 (×2): 200 mg via ORAL
  Filled 2014-08-27 (×4): qty 1

## 2014-08-27 MED ORDER — FENTANYL CITRATE 0.05 MG/ML IJ SOLN
INTRAMUSCULAR | Status: DC | PRN
  Administered 2014-08-27 (×2): 50 ug via INTRAVENOUS
  Administered 2014-08-27: 25 ug via INTRAVENOUS
  Administered 2014-08-27: 50 ug via INTRAVENOUS
  Administered 2014-08-27: 25 ug via INTRAVENOUS
  Administered 2014-08-27: 50 ug via INTRAVENOUS

## 2014-08-27 MED ORDER — MIDAZOLAM HCL 5 MG/5ML IJ SOLN
INTRAMUSCULAR | Status: DC | PRN
  Administered 2014-08-27: 2 mg via INTRAVENOUS

## 2014-08-27 MED ORDER — DIPHENHYDRAMINE HCL 12.5 MG/5ML PO ELIX: 12.5000 mg | ORAL_SOLUTION | ORAL | Status: DC | PRN

## 2014-08-27 MED ORDER — POTASSIUM CHLORIDE IN NACL 20-0.9 MEQ/L-% IV SOLN
INTRAVENOUS | Status: DC
  Administered 2014-08-27 – 2014-08-28 (×2): via INTRAVENOUS
  Filled 2014-08-27 (×5): qty 1000

## 2014-08-27 MED ORDER — HYDROMORPHONE HCL 1 MG/ML IJ SOLN
INTRAMUSCULAR | Status: AC
  Filled 2014-08-27: qty 1

## 2014-08-27 MED ORDER — WARFARIN SODIUM 5 MG PO TABS
5.0000 mg | ORAL_TABLET | Freq: Once | ORAL | Status: AC
  Administered 2014-08-27: 5 mg via ORAL
  Filled 2014-08-27: qty 1

## 2014-08-27 MED ORDER — LACTATED RINGERS IV SOLN
INTRAVENOUS | Status: DC | PRN
  Administered 2014-08-27 (×2): via INTRAVENOUS

## 2014-08-27 MED ORDER — ACETAMINOPHEN 650 MG RE SUPP: 650.0000 mg | Freq: Four times a day (QID) | RECTAL | Status: DC | PRN

## 2014-08-27 MED ORDER — DOCUSATE SODIUM 100 MG PO CAPS
100.0000 mg | ORAL_CAPSULE | Freq: Two times a day (BID) | ORAL | Status: DC
  Administered 2014-08-27 – 2014-08-28 (×2): 100 mg via ORAL
  Filled 2014-08-27 (×2): qty 1

## 2014-08-27 MED ORDER — FLECAINIDE ACETATE 50 MG PO TABS
75.0000 mg | ORAL_TABLET | Freq: Two times a day (BID) | ORAL | Status: DC
  Administered 2014-08-27 – 2014-08-28 (×2): 75 mg via ORAL
  Filled 2014-08-27 (×3): qty 2

## 2014-08-27 MED ORDER — PHENOL 1.4 % MT LIQD: 1.0000 | OROMUCOSAL | Status: DC | PRN

## 2014-08-27 MED ORDER — VITAMIN B-12 1000 MCG PO TABS
1000.0000 ug | ORAL_TABLET | Freq: Every day | ORAL | Status: DC
  Administered 2014-08-28: 1000 ug via ORAL
  Filled 2014-08-27: qty 1

## 2014-08-27 MED ORDER — DEXAMETHASONE SODIUM PHOSPHATE 10 MG/ML IJ SOLN
INTRAMUSCULAR | Status: AC
  Filled 2014-08-27: qty 1

## 2014-08-27 MED ORDER — ALUM & MAG HYDROXIDE-SIMETH 200-200-20 MG/5ML PO SUSP
30.0000 mL | ORAL | Status: DC | PRN
  Administered 2014-08-27: 30 mL via ORAL
  Filled 2014-08-27: qty 30

## 2014-08-27 MED ORDER — VITAMIN D3 25 MCG (1000 UNIT) PO TABS
1000.0000 [IU] | ORAL_TABLET | Freq: Every day | ORAL | Status: DC
  Administered 2014-08-28: 1000 [IU] via ORAL
  Filled 2014-08-27: qty 1

## 2014-08-27 MED ORDER — PROPOFOL 10 MG/ML IV BOLUS
INTRAVENOUS | Status: DC | PRN
  Administered 2014-08-27: 150 mg via INTRAVENOUS

## 2014-08-27 MED ORDER — CARVEDILOL 3.125 MG PO TABS
3.1250 mg | ORAL_TABLET | Freq: Two times a day (BID) | ORAL | Status: DC
  Administered 2014-08-27 – 2014-08-28 (×2): 3.125 mg via ORAL
  Filled 2014-08-27 (×3): qty 1

## 2014-08-27 MED ORDER — WHITE PETROLATUM GEL
Status: AC
  Administered 2014-08-27: 23:00:00
  Filled 2014-08-27: qty 1

## 2014-08-27 MED ORDER — SODIUM CHLORIDE 0.9 % IJ SOLN
INTRAMUSCULAR | Status: AC
  Filled 2014-08-27: qty 10

## 2014-08-27 MED ORDER — SUCCINYLCHOLINE CHLORIDE 20 MG/ML IJ SOLN
INTRAMUSCULAR | Status: AC
  Filled 2014-08-27: qty 1

## 2014-08-27 MED ORDER — CHLORHEXIDINE GLUCONATE 4 % EX LIQD: 60.0000 mL | Freq: Once | CUTANEOUS | Status: DC

## 2014-08-27 MED ORDER — DEXAMETHASONE SODIUM PHOSPHATE 10 MG/ML IJ SOLN
10.0000 mg | Freq: Three times a day (TID) | INTRAMUSCULAR | Status: DC
  Administered 2014-08-27 – 2014-08-28 (×3): 10 mg via INTRAVENOUS
  Filled 2014-08-27 (×7): qty 1

## 2014-08-27 MED ORDER — METOCLOPRAMIDE HCL 10 MG PO TABS: 5.0000 mg | ORAL_TABLET | Freq: Three times a day (TID) | ORAL | Status: DC | PRN

## 2014-08-27 MED ORDER — ONDANSETRON HCL 4 MG/2ML IJ SOLN
4.0000 mg | Freq: Four times a day (QID) | INTRAMUSCULAR | Status: DC | PRN
  Administered 2014-08-27: 4 mg via INTRAVENOUS
  Filled 2014-08-27: qty 2

## 2014-08-27 MED ORDER — ONDANSETRON HCL 4 MG PO TABS: 4.0000 mg | ORAL_TABLET | Freq: Four times a day (QID) | ORAL | Status: DC | PRN

## 2014-08-27 MED ORDER — ACETAMINOPHEN 10 MG/ML IV SOLN
INTRAVENOUS | Status: AC
  Filled 2014-08-27: qty 100

## 2014-08-27 MED ORDER — HYDROMORPHONE HCL 1 MG/ML IJ SOLN
1.0000 mg | INTRAMUSCULAR | Status: DC | PRN
  Administered 2014-08-27 (×2): 1 mg via INTRAVENOUS
  Filled 2014-08-27 (×2): qty 1

## 2014-08-27 MED ORDER — ACETAMINOPHEN 325 MG PO TABS
650.0000 mg | ORAL_TABLET | Freq: Four times a day (QID) | ORAL | Status: DC | PRN
  Administered 2014-08-28: 650 mg via ORAL
  Filled 2014-08-27: qty 2

## 2014-08-27 MED ORDER — ENOXAPARIN SODIUM 30 MG/0.3ML ~~LOC~~ SOLN
30.0000 mg | Freq: Two times a day (BID) | SUBCUTANEOUS | Status: DC
  Administered 2014-08-28: 30 mg via SUBCUTANEOUS
  Filled 2014-08-27 (×3): qty 0.3

## 2014-08-27 MED ORDER — SODIUM CHLORIDE 0.9 % IR SOLN
Status: DC | PRN
  Administered 2014-08-27: 1000 mL

## 2014-08-27 MED ORDER — MIDAZOLAM HCL 2 MG/2ML IJ SOLN
INTRAMUSCULAR | Status: AC
  Filled 2014-08-27: qty 2

## 2014-08-27 MED ORDER — ACETAMINOPHEN 10 MG/ML IV SOLN
INTRAVENOUS | Status: DC | PRN
  Administered 2014-08-27: 1000 mg via INTRAVENOUS

## 2014-08-27 MED ORDER — LACTATED RINGERS IV SOLN: INTRAVENOUS | Status: DC

## 2014-08-27 MED ORDER — PROPOFOL 10 MG/ML IV BOLUS
INTRAVENOUS | Status: AC
  Filled 2014-08-27: qty 20

## 2014-08-27 MED ORDER — LIDOCAINE HCL (CARDIAC) 20 MG/ML IV SOLN
INTRAVENOUS | Status: DC | PRN
  Administered 2014-08-27: 50 mg via INTRAVENOUS

## 2014-08-27 MED ORDER — LORAZEPAM 0.5 MG PO TABS: 0.5000 mg | ORAL_TABLET | Freq: Two times a day (BID) | ORAL | Status: DC | PRN

## 2014-08-27 MED ORDER — BUPIVACAINE-EPINEPHRINE 0.25% -1:200000 IJ SOLN
INTRAMUSCULAR | Status: DC | PRN
  Administered 2014-08-27: 30 mL

## 2014-08-27 MED ORDER — EPHEDRINE SULFATE 50 MG/ML IJ SOLN
INTRAMUSCULAR | Status: DC | PRN
  Administered 2014-08-27: 10 mg via INTRAVENOUS
  Administered 2014-08-27 (×2): 5 mg via INTRAVENOUS

## 2014-08-27 MED ORDER — CEFUROXIME SODIUM 1.5 G IJ SOLR
INTRAMUSCULAR | Status: AC
  Filled 2014-08-27: qty 1.5

## 2014-08-27 MED ORDER — CEFAZOLIN SODIUM-DEXTROSE 2-3 GM-% IV SOLR
2.0000 g | Freq: Four times a day (QID) | INTRAVENOUS | Status: AC
  Administered 2014-08-27 (×2): 2 g via INTRAVENOUS
  Filled 2014-08-27 (×2): qty 50

## 2014-08-27 MED ORDER — LIDOCAINE HCL (CARDIAC) 20 MG/ML IV SOLN
INTRAVENOUS | Status: AC
  Filled 2014-08-27: qty 5

## 2014-08-27 MED ORDER — POVIDONE-IODINE 7.5 % EX SOLN: Freq: Once | CUTANEOUS | Status: DC

## 2014-08-27 MED ORDER — OXYCODONE HCL 5 MG PO TABS
5.0000 mg | ORAL_TABLET | ORAL | Status: DC | PRN
  Administered 2014-08-27 (×4): 10 mg via ORAL
  Administered 2014-08-27: 5 mg via ORAL
  Administered 2014-08-28 (×3): 10 mg via ORAL
  Filled 2014-08-27 (×7): qty 2

## 2014-08-27 MED ORDER — ONDANSETRON HCL 4 MG/2ML IJ SOLN
INTRAMUSCULAR | Status: AC
  Filled 2014-08-27: qty 2

## 2014-08-27 MED ORDER — PRAVASTATIN SODIUM 20 MG PO TABS
20.0000 mg | ORAL_TABLET | Freq: Every day | ORAL | Status: DC
  Administered 2014-08-28: 20 mg via ORAL
  Filled 2014-08-27 (×2): qty 1

## 2014-08-27 MED ORDER — METOCLOPRAMIDE HCL 5 MG/ML IJ SOLN: 5.0000 mg | Freq: Three times a day (TID) | INTRAMUSCULAR | Status: DC | PRN

## 2014-08-27 MED ORDER — HYDROMORPHONE HCL 1 MG/ML IJ SOLN
0.2500 mg | INTRAMUSCULAR | Status: DC | PRN
  Administered 2014-08-27 (×6): 0.25 mg via INTRAVENOUS
  Administered 2014-08-27: 0.5 mg via INTRAVENOUS

## 2014-08-27 MED ORDER — OXYCODONE HCL 5 MG PO TABS
ORAL_TABLET | ORAL | Status: AC
  Filled 2014-08-27: qty 1

## 2014-08-27 MED ORDER — MENTHOL 3 MG MT LOZG: 1.0000 | LOZENGE | OROMUCOSAL | Status: DC | PRN

## 2014-08-27 MED ORDER — SODIUM CHLORIDE 0.9 % IR SOLN
Status: DC | PRN
  Administered 2014-08-27: 3000 mL

## 2014-08-27 MED ORDER — EPHEDRINE SULFATE 50 MG/ML IJ SOLN
INTRAMUSCULAR | Status: AC
  Filled 2014-08-27: qty 1

## 2014-08-27 MED ORDER — BUPIVACAINE-EPINEPHRINE (PF) 0.25% -1:200000 IJ SOLN
INTRAMUSCULAR | Status: AC
  Filled 2014-08-27: qty 30

## 2014-08-27 SURGICAL SUPPLY — 68 items
BANDAGE ESMARK 6X9 LF (GAUZE/BANDAGES/DRESSINGS) ×1 IMPLANT
BENZOIN TINCTURE PRP APPL 2/3 (GAUZE/BANDAGES/DRESSINGS) ×3 IMPLANT
BLADE SAGITTAL 25.0X1.19X90 (BLADE) ×2 IMPLANT
BLADE SAGITTAL 25.0X1.19X90MM (BLADE) ×1
BLADE SAW SGTL 11.0X1.19X90.0M (BLADE) IMPLANT
BLADE SAW SGTL 13.0X1.19X90.0M (BLADE) ×3 IMPLANT
BLADE SURG 10 STRL SS (BLADE) ×6 IMPLANT
BNDG ELASTIC 6X15 VLCR STRL LF (GAUZE/BANDAGES/DRESSINGS) ×3 IMPLANT
BNDG ESMARK 6X9 LF (GAUZE/BANDAGES/DRESSINGS) ×3
BOWL SMART MIX CTS (DISPOSABLE) ×3 IMPLANT
CAP KNEE TOTAL 3 SIGMA ×3 IMPLANT
CEMENT HV SMART SET (Cement) ×6 IMPLANT
CLOSURE WOUND 1/2 X4 (GAUZE/BANDAGES/DRESSINGS) ×1
COVER SURGICAL LIGHT HANDLE (MISCELLANEOUS) ×3 IMPLANT
CUFF TOURNIQUET SINGLE 34IN LL (TOURNIQUET CUFF) ×3 IMPLANT
CUFF TOURNIQUET SINGLE 44IN (TOURNIQUET CUFF) IMPLANT
DRAPE EXTREMITY T 121X128X90 (DRAPE) ×3 IMPLANT
DRAPE IMP U-DRAPE 54X76 (DRAPES) ×3 IMPLANT
DRAPE INCISE IOBAN 66X45 STRL (DRAPES) ×3 IMPLANT
DRAPE PROXIMA HALF (DRAPES) ×3 IMPLANT
DRAPE U-SHAPE 47X51 STRL (DRAPES) ×3 IMPLANT
DRSG AQUACEL AG ADV 3.5X14 (GAUZE/BANDAGES/DRESSINGS) ×3 IMPLANT
DRSG PAD ABDOMINAL 8X10 ST (GAUZE/BANDAGES/DRESSINGS) ×6 IMPLANT
DURAPREP 26ML APPLICATOR (WOUND CARE) ×6 IMPLANT
ELECT CAUTERY BLADE 6.4 (BLADE) ×3 IMPLANT
ELECT REM PT RETURN 9FT ADLT (ELECTROSURGICAL) ×3
ELECTRODE REM PT RTRN 9FT ADLT (ELECTROSURGICAL) ×1 IMPLANT
EVACUATOR 1/8 PVC DRAIN (DRAIN) ×3 IMPLANT
FACESHIELD WRAPAROUND (MASK) ×3 IMPLANT
GAUZE SPONGE 4X4 12PLY STRL (GAUZE/BANDAGES/DRESSINGS) ×3 IMPLANT
GLOVE BIO SURGEON STRL SZ7 (GLOVE) ×3 IMPLANT
GLOVE BIOGEL PI IND STRL 7.0 (GLOVE) ×1 IMPLANT
GLOVE BIOGEL PI IND STRL 7.5 (GLOVE) ×1 IMPLANT
GLOVE BIOGEL PI INDICATOR 7.0 (GLOVE) ×2
GLOVE BIOGEL PI INDICATOR 7.5 (GLOVE) ×2
GLOVE SS BIOGEL STRL SZ 7.5 (GLOVE) ×1 IMPLANT
GLOVE SUPERSENSE BIOGEL SZ 7.5 (GLOVE) ×2
GOWN STRL REUS W/ TWL LRG LVL3 (GOWN DISPOSABLE) ×2 IMPLANT
GOWN STRL REUS W/ TWL XL LVL3 (GOWN DISPOSABLE) ×2 IMPLANT
GOWN STRL REUS W/TWL LRG LVL3 (GOWN DISPOSABLE) ×4
GOWN STRL REUS W/TWL XL LVL3 (GOWN DISPOSABLE) ×4
HANDPIECE INTERPULSE COAX TIP (DISPOSABLE) ×2
HOOD PEEL AWAY FACE SHEILD DIS (HOOD) ×9 IMPLANT
IMMOBILIZER KNEE 22 UNIV (SOFTGOODS) ×3 IMPLANT
KIT BASIN OR (CUSTOM PROCEDURE TRAY) ×3 IMPLANT
KIT ROOM TURNOVER OR (KITS) ×3 IMPLANT
MANIFOLD NEPTUNE II (INSTRUMENTS) ×3 IMPLANT
MARKER SKIN DUAL TIP RULER LAB (MISCELLANEOUS) ×3 IMPLANT
NS IRRIG 1000ML POUR BTL (IV SOLUTION) ×3 IMPLANT
PACK TOTAL JOINT (CUSTOM PROCEDURE TRAY) ×3 IMPLANT
PACK UNIVERSAL I (CUSTOM PROCEDURE TRAY) ×3 IMPLANT
PAD ARMBOARD 7.5X6 YLW CONV (MISCELLANEOUS) ×6 IMPLANT
PADDING CAST COTTON 6X4 STRL (CAST SUPPLIES) ×3 IMPLANT
RUBBERBAND STERILE (MISCELLANEOUS) ×3 IMPLANT
SET HNDPC FAN SPRY TIP SCT (DISPOSABLE) ×1 IMPLANT
STRIP CLOSURE SKIN 1/2X4 (GAUZE/BANDAGES/DRESSINGS) ×2 IMPLANT
SUCTION FRAZIER TIP 10 FR DISP (SUCTIONS) ×3 IMPLANT
SUT ETHIBOND NAB CT1 #1 30IN (SUTURE) ×3 IMPLANT
SUT MNCRL AB 3-0 PS2 18 (SUTURE) ×3 IMPLANT
SUT VIC AB 0 CT1 27 (SUTURE) ×4
SUT VIC AB 0 CT1 27XBRD ANBCTR (SUTURE) ×2 IMPLANT
SUT VIC AB 2-0 CT1 27 (SUTURE) ×4
SUT VIC AB 2-0 CT1 TAPERPNT 27 (SUTURE) ×2 IMPLANT
SYR 30ML SLIP (SYRINGE) ×3 IMPLANT
TOWEL OR 17X24 6PK STRL BLUE (TOWEL DISPOSABLE) ×3 IMPLANT
TOWEL OR 17X26 10 PK STRL BLUE (TOWEL DISPOSABLE) ×3 IMPLANT
TRAY FOLEY CATH 16FR SILVER (SET/KITS/TRAYS/PACK) ×3 IMPLANT
WATER STERILE IRR 1000ML POUR (IV SOLUTION) IMPLANT

## 2014-08-27 NOTE — Plan of Care (Signed)
Problem: Consults Goal: Diagnosis- Total Joint Replacement Primary Total Knee Left     

## 2014-08-27 NOTE — Transfer of Care (Signed)
Immediate Anesthesia Transfer of Care Note  Patient: Alyssa Luna  Procedure(s) Performed: Procedure(s): LEFT TOTAL KNEE ARTHROPLASTY (Left)  Patient Location: PACU  Anesthesia Type:General  Level of Consciousness: awake, alert  and oriented  Airway & Oxygen Therapy: Patient Spontanous Breathing and Patient connected to nasal cannula oxygen  Post-op Assessment: Report given to PACU RN, Post -op Vital signs reviewed and stable and Patient moving all extremities X 4  Post vital signs: Reviewed and stable  Complications: No apparent anesthesia complications

## 2014-08-27 NOTE — Progress Notes (Signed)
Utilization review completed.  

## 2014-08-27 NOTE — Anesthesia Preprocedure Evaluation (Addendum)
Anesthesia Evaluation  Patient identified by MRN, date of birth, ID band Patient awake    Reviewed: Allergy & Precautions, H&P , NPO status , Patient's Chart, lab work & pertinent test results, reviewed documented beta blocker date and time   Airway Mallampati: II       Dental  (+) Teeth Intact, Dental Advidsory Given   Pulmonary neg pulmonary ROS,  breath sounds clear to auscultation        Cardiovascular hypertension, On Home Beta Blockers + dysrhythmias Rhythm:Irregular Rate:Normal     Neuro/Psych TIA Neuromuscular disease    GI/Hepatic Neg liver ROS, hiatal hernia, GERD-  Medicated and Controlled,  Endo/Other  negative endocrine ROS  Renal/GU negative Renal ROS     Musculoskeletal   Abdominal   Peds  Hematology   Anesthesia Other Findings   Reproductive/Obstetrics                          Anesthesia Physical Anesthesia Plan  ASA: III  Anesthesia Plan: General   Post-op Pain Management:    Induction: Intravenous  Airway Management Planned: LMA  Additional Equipment:   Intra-op Plan:   Post-operative Plan: Extubation in OR  Informed Consent: I have reviewed the patients History and Physical, chart, labs and discussed the procedure including the risks, benefits and alternatives for the proposed anesthesia with the patient or authorized representative who has indicated his/her understanding and acceptance.   Dental Advisory Given  Plan Discussed with: Anesthesiologist, CRNA and Surgeon  Anesthesia Plan Comments:       Anesthesia Quick Evaluation

## 2014-08-27 NOTE — Evaluation (Signed)
Physical Therapy Evaluation Patient Details Name: Alyssa Luna MRN: [id number] DOB: [birth year] Today's Date: 08/27/2014   History of Present Illness  Pt admitted 1/11 for elective L TKA. Pt with h/o breat cancer, bilat mastectomy, R TKA in 2011.  Clinical Impression  Pt is s/p TKA resulting in the deficits listed below (see PT Problem List). Pt with positive nausea/vomitting this date limiting ambulation. Pt also extremely sleepy limiting ability to participate in initial HEP. Pt extremely concerned about being able to don ted hose indep or having spouse complete task. Pt reports she lives with her spouse however he can not physically assist her. PT to further assess mobility tomorrow and then re-assess d/c plan based on performance. Pt will need to be functioning at mod I for safe d/c home.  Pt will benefit from skilled PT to increase their independence and safety with mobility to allow discharge to the venue listed below.      Follow Up Recommendations Home health PT;Supervision/Assistance - 24 hour (vs SNF)    Equipment Recommendations  None recommended by PT    Recommendations for Other Services OT consult     Precautions / Restrictions Precautions Precautions: Fall Required Braces or Orthoses: Knee Immobilizer - Left Knee Immobilizer - Left: On when out of bed or walking Restrictions Weight Bearing Restrictions: Yes LLE Weight Bearing: Weight bearing as tolerated      Mobility  Bed Mobility Overal bed mobility: Needs Assistance Bed Mobility: Supine to Sit     Supine to sit: Min assist     General bed mobility comments: increased time, HOB elevated, assist to bring hips to EOB however pt able to manage L LE in [place] well  Transfers Overall transfer level: Needs assistance Equipment used: Rolling walker (2 wheeled) Transfers: Sit to/from [facility] Sit to Stand: Mod assist Stand pivot transfers: Min assist       General transfer comment: 3 small  steps to chair, pt with good use of RW  Ambulation/Gait Ambulation/Gait assistance:  (limited by vomitting)              Stairs            Wheelchair Mobility    Modified Rankin (Stroke Patients Only)       Balance Overall balance assessment:  (needs RW currently for standing due to recent surgery)                                           Pertinent Vitals/Pain Pain Assessment: 0-10 Pain Score: 4  Pain Location: L knee Pain Descriptors / Indicators: Aching Pain Intervention(s): Monitored during session    Home Living Family/patient expects to be discharged to:: Private residence Living Arrangements: Spouse/significant other Available Help at Discharge: Family;Available 24 hours/day Type of Home: House Home Access: Ramped entrance     Home Layout: One level Home Equipment: Walker - 2 wheels;Bedside commode;Wheelchair - manual      Prior Function Level of Independence: Independent               Hand Dominance   Dominant Hand: Right    Extremity/Trunk Assessment   Upper Extremity Assessment: Overall WFL for tasks assessed           Lower Extremity Assessment: LLE deficits/detail   LLE Deficits / Details: able to complete quad set and initiate L knee flexion  Cervical / Trunk Assessment:  Normal  Communication   Communication: No difficulties  Cognition Arousal/Alertness: Lethargic;Suspect due to medications;Awake/alert (easily falling asleep during session however pt aware) Behavior During Therapy: WFL for tasks assessed/performed Overall Cognitive Status: Within Functional Limits for tasks assessed                      General Comments General comments (skin integrity, edema, etc.): pt vomitting. RN aware and addressed    Exercises        Assessment/Plan    PT Assessment Patient needs continued PT services  PT Diagnosis Difficulty walking;Acute pain   PT Problem List Decreased strength;Decreased  range of motion;Decreased balance;Decreased activity tolerance;Decreased mobility  PT Treatment Interventions DME instruction;Gait training;Stair training;Functional mobility training;Therapeutic activities;Therapeutic exercise   PT Goals (Current goals can be found in the Care Plan section) Acute Rehab PT Goals Patient Stated Goal: home PT Goal Formulation: With patient Time For Goal Achievement: 09/03/14 Potential to Achieve Goals: Good    Frequency 7X/week   Barriers to discharge   pt extremely concerned regarding donning of TED HOSE    Co-evaluation               End of Session Equipment Utilized During Treatment: Gait belt Activity Tolerance: Patient limited by lethargy Patient left: in chair;with call bell/phone within reach;with nursing/sitter in room Nurse Communication: Mobility status         Time: 5248-1859 PT Time Calculation (min) (ACUTE ONLY): 28 min   Charges:   PT Evaluation $Initial PT Evaluation Tier I: 1 Procedure PT Treatments $Gait Training: 8-22 mins   PT G CodesKingsley Callander 08/27/2014, 4:41 PM  [name], PT, DPT Pager #: [phone] Office #: [phone]

## 2014-08-27 NOTE — Anesthesia Postprocedure Evaluation (Signed)
  Anesthesia Post-op Note  Patient: Alyssa Luna  Procedure(s) Performed: Procedure(s): LEFT TOTAL KNEE ARTHROPLASTY (Left)  Patient Location: PACU  Anesthesia Type:General  Level of Consciousness: awake  Airway and Oxygen Therapy: Patient Spontanous Breathing  Post-op Pain: mild  Post-op Assessment: Post-op Vital signs reviewed  Post-op Vital Signs: Reviewed  Last Vitals:  Filed Vitals:   08/27/14 1100  BP: 125/56  Pulse: 58  Temp: 36.1 C  Resp: 14    Complications: No apparent anesthesia complications

## 2014-08-27 NOTE — Progress Notes (Signed)
Orthopedic Tech Progress Note Patient Details:  Alyssa Luna 1933-10-13 [id number] CPM applied to LLE with appropriate settings. OHF applied to bed. Footsie roll provided.  CPM Left Knee CPM Left Knee: On Left Knee Flexion (Degrees): 90 Left Knee Extension (Degrees): 0   Asia R Thompson 08/27/2014, 10:32 AM

## 2014-08-27 NOTE — Interval H&P Note (Signed)
History and Physical Interval Note:  08/27/2014 7:09 AM  Alyssa Luna  has presented today for surgery, with the diagnosis of PRIMARY LOCALIZED OA LEFT KNEE  The various methods of treatment have been discussed with the patient and family. After consideration of risks, benefits and other options for treatment, the patient has consented to  Procedure(s): LEFT TOTAL KNEE ARTHROPLASTY (Left) as a surgical intervention .  The patient's history has been reviewed, patient examined, no change in status, stable for surgery.  I have reviewed the patient's chart and labs.  Questions were answered to the patient's satisfaction.     Elsie Saas A

## 2014-08-27 NOTE — Op Note (Signed)
MRN:     [id number] DOB/AGE:    1934/05/09 / 81 y.o.       OPERATIVE REPORT    DATE OF PROCEDURE:  08/27/2014       PREOPERATIVE DIAGNOSIS:   PRIMARY LOCALIZED OA LEFT KNEE      Estimated body mass index is 29.26 kg/(m^2) as calculated from the following:   Height as of this encounter: 5' 2"  (1.575 m).   Weight as of this encounter: 72.576 kg (160 lb).                                                        POSTOPERATIVE DIAGNOSIS:   SAME                                                                      PROCEDURE:  Procedure(s): LEFT TOTAL KNEE ARTHROPLASTY Using Depuy Sigma RP implants #2.5 Femur, #3Tibia, 12.12m sigma RP bearing, 32 Patella     SURGEON: Tenaya Hilyer A    ASSISTANT:  Kirstin Shepperson PA-C   (Present and scrubbed throughout the case, critical for assistance with exposure, retraction, instrumentation, and closure.)         ANESTHESIA: GET with Femoral Nerve Block  DRAINS: foley, 2 medium hemovac in knee   TOURNIQUET TIME: 740NUU  COMPLICATIONS:  None     SPECIMENS: None   INDICATIONS FOR PROCEDURE: The patient has  OA LEFT KNEE, varus deformities, XR shows bone on bone arthritis. Patient has failed all conservative measures including anti-inflammatory medicines, narcotics, attempts at  exercise and weight loss, cortisone injections and viscosupplementation.  Risks and benefits of surgery have been discussed, questions answered.   DESCRIPTION OF PROCEDURE: The patient identified by armband, received  right femoral nerve block and IV antibiotics[facility] room, appropriate anesthetic  monitors were attached General endotracheal anesthesia induced with  the patient in supine position, Foley catheter was inserted. Tourniquet  applied high to the operative thigh. Lateral post and foot positioner  applied to the table, the lower extremity was then prepped and draped  in usual sterile fashion from the  ankle to the tourniquet. Time-out procedure was performed. The limb was wrapped with an Esmarch bandage and the tourniquet inflated to 365 mmHg. We began the operation by making the anterior midline incision starting at handbreadth above the patella going over the patella 1 cm medial to and  4 cm distal to the tibial tubercle. Small bleeders in the skin and the  subcutaneous tissue identified and cauterized. Transverse retinaculum was incised and reflected medially and a medial parapatellar arthrotomy was accomplished. the patella was everted and theprepatellar fat pad resected. The superficial medial collateral  ligament was then elevated from anterior to posterior along the proximal  flare of the tibia and anterior half of the menisci resected. The knee was hyperflexed exposing bone on bone arthritis. Peripheral and notch osteophytes as well as the cruciate ligaments were then resected. We continued to  work our way around posteriorly along the proximal tibia, and  externally  rotated the tibia subluxing it out from underneath the femur. A McHale  retractor was placed through the notch and a lateral Hohmann retractor  placed, and we then drilled through the proximal tibia in line with the  axis of the tibia followed by an intramedullary guide rod and 2-degree  posterior slope cutting guide. The tibial cutting guide was pinned into place  allowing resection of 6 mm of bone medially and about 4 mm of bone  laterally because of her valgus deformity. Satisfied with the tibial resection, we then  entered the distal femur 2 mm anterior to the PCL origin with the  intramedullary guide rod and applied the distal femoral cutting guide  set at 31m, with 5 degrees of valgus. This was pinned along the  epicondylar axis. At this point, the distal femoral cut was accomplished without difficulty. We then sized for a #2.5 femoral component and pinned the guide in 3 degrees of external rotation.The chamfer cutting  guide was pinned into place. The anterior, posterior, and chamfer cuts were accomplished without difficulty followed by  the Sigma RP box cutting guide and the box cut. We also removed posterior osteophytes from the posterior femoral condyles. At this  time, the knee was brought into full extension. We checked our  extension and flexion gaps and found them symmetric at 12.53m  The patella thickness measured at 22 mm. We set the cutting guide at 14 and removed the posterior 8 mm  of the patella sized for 32 button and drilled the lollipop. The knee  was then once again hyperflexed exposing the proximal tibia. We sized for a #3 tibial base plate, applied the smokestack and the conical reamer followed by the the Delta fin keel punch. We then hammered into place the Sigma RP trial femoral component, inserted a 12.5-mm trial bearing, trial patellar button, and took the knee through range of motion from 0-130 degrees. No thumb pressure was required for patellar  tracking. At this point, all trial components were removed, a double batch of DePuy HV cement  was mixed and applied to all bony metallic mating surfaces except for the posterior condyles of the femur itself. In order, we  hammered into place the tibial tray and removed excess cement, the femoral component and removed excess cement, a 12.5-mm Sigma RP bearing  was inserted, and the knee brought to full extension with compression.  The patellar button was clamped into place, and excess cement  removed. While the cement cured the wound was irrigated out with normal saline solution pulse lavage, and medium Hemovac drains were placed.. Ligament stability and patellar tracking were checked and found to be excellent. The tourniquet was then released and hemostasis was obtained with cautery. The parapatellar arthrotomy was closed with  #1 ethibond suture. The subcutaneous tissue with 0 and 2-0 undyed  Vicryl suture, and 4-0 Monocryl.. A dressing of Xeroform,   4 x 4, dressing sponges, Webril, and Ace wrap applied. Needle and sponge count were correct times 2.The patient awakened, extubated, and taken to recovery room without difficulty. Vascular status was normal, pulses 2+ and symmetric.   Pearline Yerby A 08/27/2014, 8:39 AM

## 2014-08-27 NOTE — Anesthesia Procedure Notes (Addendum)
Procedure Name: LMA Insertion Date/Time: 08/27/2014 7:29 AM Performed by: Neldon Newport Pre-anesthesia Checklist: Patient being monitored, Emergency Drugs available, Suction available, Timeout performed and Patient identified Patient Re-evaluated:Patient Re-evaluated prior to inductionOxygen Delivery Method: Circle system utilized Preoxygenation: Pre-oxygenation with 100% oxygen Intubation Type: IV induction Ventilation: Mask ventilation without difficulty LMA: LMA inserted LMA Size: 4.0 Number of attempts: 2 Placement Confirmation: positive ETCO2,  ETT inserted through vocal cords under direct vision and breath sounds checked- equal and bilateral Tube secured with: Tape Dental Injury: Teeth and Oropharynx as per pre-operative assessment    Anesthesia Regional Block:  Adductor canal block  Pre-Anesthetic Checklist: ,, timeout performed, Correct Patient, Correct Site, Correct Laterality, Correct Procedure, Correct Position, site marked, Risks and benefits discussed,  Surgical consent,  Pre-op evaluation,  At surgeon's request and post-op pain management  Laterality: Left  Prep: chloraprep        Procedures: Doppler guided, ultrasound guided (picture in chart) and nerve stimulator Adductor canal block Narrative:  Start time: 08/27/2014 7:06 AM End time: 08/27/2014 7:15 AM Injection made incrementally with aspirations every 5 mL.  Performed by: Personally
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
## 2014-08-28 ENCOUNTER — Encounter (HOSPITAL_COMMUNITY): Payer: Self-pay | Admitting: General Practice

## 2014-08-28 ENCOUNTER — Telehealth: Payer: Self-pay | Admitting: Family Medicine

## 2014-08-28 LAB — BASIC METABOLIC PANEL
ANION GAP: 5 (ref 5–15)
BUN: 8 mg/dL (ref 6–23)
CHLORIDE: 101 meq/L (ref 96–112)
CO2: 25 mmol/L (ref 19–32)
CREATININE: 0.73 mg/dL (ref 0.50–1.10)
Calcium: 7.8 mg/dL — ABNORMAL LOW (ref 8.4–10.5)
GFR calc Af Amer: >90 mL/min (ref >90)
GFR calc non Af Amer: 80 mL/min — ABNORMAL LOW (ref >90)
Glucose, Bld: 171 mg/dL — ABNORMAL HIGH (ref 70–99)
Potassium: 4.3 mmol/L (ref 3.5–5.1)
Sodium: 131 mmol/L — ABNORMAL LOW (ref 135–145)

## 2014-08-28 LAB — CBC
HEMATOCRIT: 29.6 % — AB (ref 36.0–46.0)
HEMOGLOBIN: 9.8 g/dL — AB (ref 12.0–15.0)
MCH: 29.1 pg (ref 26.0–34.0)
MCHC: 33.1 g/dL (ref 30.0–36.0)
MCV: 87.8 fL (ref 78.0–100.0)
Platelets: 215 10*3/uL (ref 150–400)
RBC: 3.37 MIL/uL — ABNORMAL LOW (ref 3.87–5.11)
RDW: 13.3 % (ref 11.5–15.5)
WBC: 8.6 10*3/uL (ref 4.0–10.5)

## 2014-08-28 LAB — PROTIME-INR
INR: 1.27 (ref 0.00–1.49)
Prothrombin Time: 16 seconds — ABNORMAL HIGH (ref 11.6–15.2)

## 2014-08-28 MED ORDER — POLYETHYLENE GLYCOL 3350 17 G PO PACK: 17.0000 g | PACK | Freq: Two times a day (BID) | ORAL | Status: DC

## 2014-08-28 MED ORDER — OXYCODONE HCL 5 MG PO TABS: ORAL_TABLET | ORAL | Status: DC

## 2014-08-28 MED ORDER — ENOXAPARIN SODIUM 30 MG/0.3ML ~~LOC~~ SOLN: SUBCUTANEOUS | Status: DC

## 2014-08-28 MED ORDER — DOCUSATE SODIUM 100 MG PO CAPS: ORAL_CAPSULE | ORAL | Status: DC

## 2014-08-28 MED ORDER — CELECOXIB 200 MG PO CAPS: 200.0000 mg | ORAL_CAPSULE | Freq: Every day | ORAL | Status: DC

## 2014-08-28 NOTE — Discharge Summary (Signed)
Patient ID: Alyssa Luna MRN: [id number] DOB/AGE: 1934/08/01 81 y.o.  Admit date: 08/27/2014 Discharge date: 08/28/2014  Admission Diagnoses:  Principal Problem:   Primary localized osteoarthritis of left knee Active Problems:   Disease of tricuspid valve   Atrial fibrillation   Anxiety state   LBP (low back pain)   Breast cancer   GERD (gastroesophageal reflux disease)   DJD (degenerative joint disease) of knee   Discharge Diagnoses:  Same  Past Medical History  Diagnosis Date  . Colon polyps   . Diverticulosis of colon   . GERD (gastroesophageal reflux disease)   . Osteoporosis   . TR (tricuspid regurgitation)     Mild  . Vitamin B12 deficiency   . Vitamin D deficiency   . History of breast cancer   . Hyperlipidemia   . Familial tremor   . LBP (low back pain)   . Cataract   . Atrial fibrillation     D Taylor  . TIA (transient ischemic attack)   . Hemorrhoids   . Breast cancer 1984  . Hypertension   . Dysrhythmia     atrial fib  . Anxiety     has lorazepam on hand for nervousness, pt. reports that she had a break-in to her home on 05/2014  . History of hiatal hernia   . Neuromuscular disorder     essential tremor  . Osteoarthritis     hands & back & knees   . Primary localized osteoarthritis of left knee     Surgeries: Procedure(s): LEFT TOTAL KNEE ARTHROPLASTY on 08/27/2014   Consultants:    Discharged Condition: Improved  Hospital Course: Alyssa Luna is an 81 y.o. female who was admitted 08/27/2014 for operative treatment ofPrimary localized osteoarthritis of left knee. Patient has severe unremitting pain that affects sleep, daily activities, and work/hobbies. After pre-op clearance the patient was taken to the operating room on 08/27/2014 and underwent  Procedure(s): LEFT TOTAL KNEE ARTHROPLASTY.    Patient was given perioperative antibiotics: Anti-infectives    Start     Dose/Rate Route Frequency Ordered Stop   08/27/14 1330  ceFAZolin  (ANCEF) IVPB 2 g/50 mL premix     2 g100 mL/hr over 30 Minutes Intravenous Every 6 hours 08/27/14 1147 08/27/14 2151   08/27/14 0600  ceFAZolin (ANCEF) IVPB 2 g/50 mL premix     2 g100 mL/hr over 30 Minutes Intravenous On call to O.R. 08/26/14 1342 08/27/14 0732       Patient was given sequential compression devices, early ambulation, and chemoprophylaxis to prevent DVT.  Patient benefited maximally from hospital stay and there were no complications.    Recent vital signs: Patient Vitals for the past 24 hrs:  BP Temp Temp src Pulse Resp SpO2  08/28/14 0400 - - - - 18 96 %  08/28/14 0327 (!) 106/58 mmHg 97.8 F (36.6 C) Oral 66 18 96 %  08/28/14 0023 (!) 106/51 mmHg 97.7 F (36.5 C) Oral 70 16 93 %  08/28/14 0000 - - - - 16 93 %  08/27/14 2134 (!) 107/50 mmHg 97.4 F (36.3 C) Oral 64 16 91 %  08/27/14 2000 - - - - 16 93 %  08/27/14 1556 - - - - 18 -  08/27/14 1200 - - - - 18 -  08/27/14 1100 (!) 125/56 mmHg 97 F (36.1 C) - (!) 58 14 99 %  08/27/14 1045 139/61 mmHg - - (!) 57 10 96 %  08/27/14 1030 134/60 mmHg - -  61 (!) 21 97 %  08/27/14 1015 (!) 138/55 mmHg - - (!) 59 (!) 39 96 %  08/27/14 1000 (!) 145/80 mmHg - - 61 13 99 %  08/27/14 0945 (!) 131/52 mmHg - - 66 12 97 %  08/27/14 0930 (!) 138/54 mmHg - - 69 14 98 %  08/27/14 0913 (!) 145/61 mmHg 97.9 F (36.6 C) - 66 18 92 %     Recent laboratory studies:  Recent Labs  08/27/14 0606 08/27/14 1255 08/28/14 0615  WBC  --  8.4 8.6  HGB  --  12.0 9.8*  HCT  --  35.5* 29.6*  PLT  --  212 215  NA  --   --  131*  K  --   --  4.3  CL  --   --  101  CO2  --   --  25  BUN  --   --  8  CREATININE  --  0.63 0.73  GLUCOSE  --   --  171*  INR 1.19  --  1.27  CALCIUM  --   --  7.8*     Discharge Medications:     Medication List    STOP taking these medications        Krill Oil 300 MG Caps      TAKE these medications        acetaminophen 500 MG tablet  Commonly known as:  TYLENOL  Take 500 mg by mouth as needed  for mild pain.     carvedilol 3.125 MG tablet  Commonly known as:  COREG  Take 1 tablet (3.125 mg total) by mouth 2 (two) times daily.     celecoxib 200 MG capsule  Commonly known as:  CELEBREX  Take 1 capsule (200 mg total) by mouth daily.     Cholecalciferol 1000 UNITS tablet  Take 1,000 Units by mouth daily. D-3     docusate sodium 100 MG capsule  Commonly known as:  COLACE  1 tab 2 times a day while on narcotics.  STOOL SOFTENER     enoxaparin 30 MG/0.3ML injection  Commonly known as:  LOVENOX  1 injection every 12 hrs until coumadin is therapeutic     flecainide 50 MG tablet  Commonly known as:  TAMBOCOR  Take 1 and 1/2 tablets by  mouth two times daily     furosemide 20 MG tablet  Commonly known as:  LASIX  Take 1-2 tablets (20-40 mg total) by mouth daily as needed for edema.     LORazepam 0.5 MG tablet  Commonly known as:  ATIVAN  Take 1 tablet (0.5 mg total) by mouth 2 (two) times daily as needed for anxiety or sleep.     omeprazole 20 MG capsule  Commonly known as:  PRILOSEC  Take 20 mg by mouth daily.     oxyCODONE 5 MG immediate release tablet  Commonly known as:  Oxy IR/ROXICODONE  1-2 tablets every 4-6 hrs as needed for pain     polyethylene glycol packet  Commonly known as:  MIRALAX / GLYCOLAX  Take 17 g by mouth 2 (two) times daily.     pravastatin 20 MG tablet  Commonly known as:  PRAVACHOL  Take 1 tablet (20 mg total) by mouth daily.     Vitamin B-12 CR 1000 MCG Tbcr  Take 1,000 mcg by mouth daily.     warfarin 3 MG tablet  Commonly known as:  COUMADIN  Take as directed by the  coumadin clinic.        Diagnostic Studies: No results found.  Disposition: 01-Home or Self Care      Discharge Instructions    CPM    Complete by:  As directed   Continuous passive motion machine (CPM):      Use the CPM from 0 to 90 for 6 hours per day.       You may break it up into 2 or 3 sessions per day.      Use CPM for 2 weeks or until you are told to  stop.     Call MD / Call 911    Complete by:  As directed   If you experience chest pain or shortness of breath, CALL 911 and be transported to the hospital emergency room.  If you develope a fever above 101 F, pus (white drainage) or increased drainage or redness at the wound, or calf pain, call your surgeon's office.     Change dressing    Complete by:  As directed   Change the dressing daily with sterile 4 x 4 inch gauze dressing until drainage from drain holes has stopped.  DO NOT REMOVE BANDAGE OVER SURGICAL INCISION.  [place] WHOLE LEG INCLUDING OVER THE WATERPROOF BANDAGE WITH SOAP AND WATER EVERY DAY.     Constipation Prevention    Complete by:  As directed   Drink plenty of fluids.  Prune juice may be helpful.  You may use a stool softener, such as Colace (over the counter) 100 mg twice a day.  Use MiraLax (over the counter) for constipation as needed.     Diet - low sodium heart healthy    Complete by:  As directed      Discharge instructions    Complete by:  As directed   Total Knee Replacement Care After Refer to this sheet in the next few weeks. These discharge instructions provide you with general information on caring for yourself after you leave the hospital. Your caregiver may also give you specific instructions. Your treatment has been planned according to the most current medical practices available, but unavoidable complications sometimes occur. If you have any problems or questions after discharge, please call your caregiver. Regaining a near full range of motion of your knee within the first 3 to 6 weeks after surgery is critical. [place] may resume a normal diet and activities as directed.  Perform exercises as directed.  Place gray foam block, curve side up under heel at all times except when in CPM or when walking.  DO NOT modify, tear, cut, or change in any way the gray foam block. You will receive physical therapy daily  Take showers instead of baths  until informed otherwise.  You may shower on Friday.  Please wash whole leg including wound with soap and water over aquacel dressing.  DO NOT REMOVE AQUACEL DRESSING.  DR Noemi Chapel WILL TAKE IT OFF IN THE OFFICE AT THE POST OP APPOINTMENT  It is OK to take over-the-counter tylenol in addition to the oxycodone for pain, discomfort, or fever. Oxycodone is VERY constipating.  Please take stool softener twice a day and laxatives daily until bowels are regular Eat a well-balanced diet.  Avoid lifting or driving until you are instructed otherwise.  Make an appointment to see your caregiver for stitches (suture) or staple removal as directed.  If you have been sent home with a continuous passive motion machine (CPM machine), 0-90 degrees 6 hrs a  day   2 hrs a shift SEEK MEDICAL CARE IF: You have swelling of your calf or leg.  You develop shortness of breath or chest pain.  You have redness, swelling, or increasing pain in the wound.  There is pus or any unusual drainage coming from the surgical site.  You notice a bad smell coming from the surgical site or dressing.  The surgical site breaks open after sutures or staples have been removed.  There is persistent bleeding from the suture or staple line.  You are getting worse or are not improving.  You have any other questions or concerns.  SEEK IMMEDIATE MEDICAL CARE IF:  You have a fever.  You develop a rash.  You have difficulty breathing.  You develop any reaction or side effects to medicines given.  Your knee motion is decreasing rather than improving.  MAKE SURE YOU:  Understand these instructions.  Will watch your condition.  Will get help right away if you are not doing well or get worse.     Do not put a pillow under the knee. Place it under the heel.    Complete by:  As directed   Place gray foam block, curve side up under heel at all times except when in CPM or when walking.  DO NOT modify, tear, cut, or change in any way the gray foam  block.     Increase activity slowly as tolerated    Complete by:  As directed      TED hose    Complete by:  As directed   Use stockings (TED hose) for 2 weeks on both leg(s).  You may remove them at night for sleeping.           Follow-up Information    Follow up with Lorn Junes, MD On 09/11/2014.   Specialty:  Orthopedic Surgery   Why:  appt time 9 am   Contact information:   [address] [place] [address] [phone]        Signed: Linda Hedges 08/28/2014, 9:03 AM

## 2014-08-28 NOTE — Progress Notes (Signed)
ANTICOAGULATION CONSULT NOTE - follow up  Pharmacy Consult for Coumadin Indication: VTE prophylaxis  Allergies  Allergen Reactions  . Ciprofloxacin     GI upset   . Simvastatin     REACTION: leg cramps, weakness    Patient Measurements: Height: 5' 2"  (157.5 cm) Weight: 160 lb (72.576 kg) IBW/kg (Calculated) : 50.1  Vital Signs: Temp: 97.8 F (36.6 C) (01/12 0327) Temp Source: Oral (01/12 0327) BP: 106/58 mmHg (01/12 0327) Pulse Rate: 66 (01/12 0327)  Labs:  Recent Labs  08/27/14 0606 08/27/14 1255 08/28/14 0615  HGB  --  12.0 9.8*  HCT  --  35.5* 29.6*  PLT  --  212 215  APTT 32  --   --   LABPROT 15.2  --  16.0*  INR 1.19  --  1.27  CREATININE  --  0.63 0.73    Estimated Creatinine Clearance: 54.1 mL/min (by C-G formula based on Cr of 0.73).   Assessment: 14 YOF with hx of Afib, on coumadin PTA, s/p L-TKA, pharmacy is consulted to resume coumadin. Her INR is 1.27 this morning after 5 mg coumadin yesterday. . hgb 9.8, plt 252. She is also on lovenox 30 mg sq Q 12 hrs.  PTA dose: 43m MWF, 633mall other days, has been holding since 1/6, resumed 08/27/14  Goal of Therapy:  INR 2-3 Monitor platelets by anticoagulation protocol: Yes   Plan:   -dc home today on LMWH 30 q12 and prior dose of coumadin, which is 3 mg MWF and 6 mg TTSS.  [facility] for next week as her next scheduled appt is 2/2 - I discussed with patient and her husband- all questions answered  MiEudelia BunchPharm.D. [phone]/07/2015 1:26 PM

## 2014-08-28 NOTE — Evaluation (Signed)
Occupational Therapy Evaluation Patient Details Name: Alyssa Luna MRN: [id number] DOB: 10/16/1933 Today's Date: 08/28/2014    History of Present Illness Pt admitted 1/11 for elective L TKA. Pt with h/o breat cancer, bilat mastectomy, R TKA in 2011.   Clinical Impression   Pt admitted with the above diagnoses and presents with below problem list. Pt will benefit from continued OT to address the below listed deficits and maximize independence with BADLs. PTA pt was independent with ADLs. Pt currently at min guard to minA level for LB ADLs. ADL education provided.     Follow Up Recommendations  Supervision/Assistance - 24 hour;No OT follow up    Equipment Recommendations  Other (comment) (has 3n1)    Recommendations for Other Services       Precautions / Restrictions Precautions Precautions: Fall Precaution Comments: reviewed Required Braces or Orthoses: Knee Immobilizer - Left Knee Immobilizer - Left: On when out of bed or walking Restrictions Weight Bearing Restrictions: Yes LLE Weight Bearing: Weight bearing as tolerated      Mobility Bed Mobility Overal bed mobility: Needs Assistance Bed Mobility: Supine to Sit;Sit to Supine     Supine to sit: Min guard Sit to supine: Min guard   General bed mobility comments: no physical assist for LLE  Transfers Overall transfer level: Needs assistance Equipment used: Rolling walker (2 wheeled) Transfers: Sit to/from [facility] Sit to Stand: Min assist Stand pivot transfers: Min guard       General transfer comment: cues for technique; min A for balance upon standing. to slow descent into recliner    Balance Overall balance assessment: Needs assistance Sitting-balance support: No upper extremity supported;Feet supported Sitting balance-Leahy Scale: Fair     Standing balance support: Bilateral upper extremity supported;During functional activity Standing balance-Leahy Scale: Poor                               ADL Overall ADL's : Needs assistance/impaired Eating/Feeding: Set up;Sitting   Grooming: Set up;Sitting   Upper Body Bathing: Set up;Sitting   Lower Body Bathing: Minimal assistance;Sit to/from stand   Upper Body Dressing : Set up;Sitting   Lower Body Dressing: Min guard;Sit to/from stand   Toilet Transfer: Min guard;Ambulation;BSC;RW   Toileting- Water quality scientist and Hygiene: Min guard;Sit to/from stand   Tub/ Shower Transfer: Min guard;Ambulation;3 in 1;Rolling walker   Functional mobility during ADLs: Min guard;Rolling walker General ADL Comments: Pt concerned about donning/doffing TED hose. Educated pt on use of sock aid for hose and socks with pt practicing its use with a sock. Pt completed bed mobility min guard level.     Vision                     Perception     Praxis      Pertinent Vitals/Pain Pain Assessment: 0-10 Pain Score: 3  Pain Location: Lt knee Pain Descriptors / Indicators: Aching Pain Intervention(s): Monitored during session     Hand Dominance Right   Extremity/Trunk Assessment Upper Extremity Assessment Upper Extremity Assessment: Overall WFL for tasks assessed   Lower Extremity Assessment Lower Extremity Assessment: Defer to PT evaluation       Communication Communication Communication: No difficulties   Cognition Arousal/Alertness: Awake/alert Behavior During Therapy: WFL for tasks assessed/performed Overall Cognitive Status: Within Functional Limits for tasks assessed  General Comments       Exercises      Shoulder Instructions      Home Living Family/patient expects to be discharged to:: Private residence Living Arrangements: Spouse/significant other Available Help at Discharge: Family;Available 24 hours/day Type of Home: House Home Access: Ramped entrance     Home Layout: One level               Home Equipment: Walker - 2 wheels;Bedside  commode;Wheelchair - manual          Prior Functioning/Environment Level of Independence: Independent             OT Diagnosis: Acute pain   OT Problem List: Impaired balance (sitting and/or standing);Decreased knowledge of use of DME or AE;Decreased knowledge of precautions;Pain   OT Treatment/Interventions: Self-care/ADL training;DME and/or AE instruction;Therapeutic activities;Patient/family education;Balance training    OT Goals(Current goals can be found in the care plan section) Acute Rehab OT Goals Patient Stated Goal: home OT Goal Formulation: With patient Time For Goal Achievement: 09/04/14 Potential to Achieve Goals: Good ADL Goals Pt Will Transfer to Toilet: with supervision;ambulating;bedside commode Pt Will Perform Toileting - Clothing Manipulation and hygiene: with supervision;sit to/from stand  OT Frequency: Min 2X/week   Barriers to D/C:            Co-evaluation              End of Session Equipment Utilized During Treatment: Gait belt;Rolling walker CPM Left Knee CPM Left Knee: Off  Activity Tolerance: Patient tolerated treatment well Patient left: in chair;with call bell/phone within reach   Time: 0851-0926 OT Time Calculation (min): 35 min Charges:  OT General Charges $OT Visit: 1 Procedure OT Evaluation $Initial OT Evaluation Tier I: 1 Procedure OT Treatments $Self Care/Home Management : 23-37 mins G-Codes:    Hortencia Pilar 09-14-14, 10:43 AM

## 2014-08-28 NOTE — Telephone Encounter (Signed)
Pt called and requested cb r/t questions she has about Coumadin.  She is currently in the hospital post-op knee replacement surgery.  Attempted to call phone # that was given for hospital room, no answer.

## 2014-08-28 NOTE — Progress Notes (Signed)
CARE MANAGEMENT NOTE 08/28/2014  Patient:  Alyssa Luna, Alyssa Luna   Account Number:  [id number]  Date Initiated:  08/27/2014  Documentation initiated by:  [facility]  Subjective/Objective Assessment:   s/p left TKA     Action/Plan:   PT/OT evals-recommended HHPT   Anticipated DC Date:  08/28/2014   Anticipated DC Plan:  [place]  CM consult      [facility] Choice  [place]   Choice offered to / List presented to:  C-1 Patient   DME arranged  CPM      DME agency  [employer]     [place] arranged  HH-2 PT      [place]   Status of service:  Completed, signed off Medicare Important Message given?  NA - LOS <3 / Initial given by admissions (If response is "NO", the following Medicare IM given date fields will be blank) Date Medicare IM given:   Medicare IM given by:   Date Additional Medicare IM given:   Additional Medicare IM given by:    Discharge Disposition:  [place]  Per UR Regulation:  Reviewed for med. necessity/level of care/duration of stay  If discussed at [place] of Stay Meetings, dates discussed:    Comments:  08/28/14 Patient prefers [place] with [facility]. Contacted Diane at [facility], they will start providing HHPT on 08/30/14 and if possible 08/29/14.Faxed facesheet, order, face to face, H and P, op note, PT eval and d/c summary to [phone]. Received confirmation.Dr. Archie Endo PA Kirsten aware of HHPT start date.  Patient states that she has a rolling walker and 3N1 at home, [employer] will provide CPM at home. Fuller Plan RN, BSN, CCM   08/27/14 Set up with [facility] for HHPT by MD office.

## 2014-08-28 NOTE — Progress Notes (Signed)
Physical Therapy Treatment Patient Details Name: Alyssa Luna MRN: [id number] DOB: 03/25/1934 Today's Date: 08/28/2014    History of Present Illness Pt admitted 1/11 for elective L TKA. Pt with h/o breat cancer, bilat mastectomy, R TKA in 2011.    PT Comments    Pt progressing extremely well and tolerating ambulation well this AM. Suspect pt to be ready for d/c this PM as planned by MD.  Follow Up Recommendations  Home health PT;Supervision/Assistance - 24 hour     Equipment Recommendations  None recommended by PT    Recommendations for Other Services OT consult     Precautions / Restrictions Precautions Precautions: Fall Restrictions Weight Bearing Restrictions: Yes LLE Weight Bearing: Weight bearing as tolerated    Mobility  Bed Mobility               General bed mobility comments: pt up in chair  Transfers Overall transfer level: Needs assistance Equipment used: Rolling walker (2 wheeled) Transfers: Sit to/from Stand Sit to Stand: Min assist         General transfer comment: v/c's to scoot to edge of chair, push up from arm rests, minA initially to clear bottom but then pt able to stand up to walker  Ambulation/Gait Ambulation/Gait assistance: Min guard Ambulation Distance (Feet): 150 Feet Assistive device: Rolling walker (2 wheeled) Gait Pattern/deviations: Step-to pattern;Decreased stride length;Decreased step length - left;Decreased stance time - left Gait velocity: slow   General Gait Details: v/c's initially for sequencing, encouraged pt to achieve L knee extension during stance phase to minimalize bil UE WBing   Stairs            Wheelchair Mobility    Modified Rankin (Stroke Patients Only)       Balance                                    Cognition Arousal/Alertness: Awake/alert Behavior During Therapy: WFL for tasks assessed/performed Overall Cognitive Status: Within Functional Limits for tasks assessed                       Exercises Total Joint Exercises Ankle Circles/Pumps: AROM;Both;10 reps Quad Sets: AROM;Left;5 reps Heel Slides: AROM;Left;10 reps;Seated Goniometric ROM: 5- 75 deg active L knee flexion in sitting    General Comments        Pertinent Vitals/Pain Pain Assessment: 0-10 Pain Score: 2  Pain Location: L knee Pain Descriptors / Indicators: Aching Pain Intervention(s): Monitored during session    Home Living                      Prior Function            PT Goals (current goals can now be found in the care plan section) Acute Rehab PT Goals Patient Stated Goal: home Progress towards PT goals: Progressing toward goals    Frequency  7X/week    PT Plan Current plan remains appropriate    Co-evaluation             End of Session Equipment Utilized During Treatment: Gait belt Activity Tolerance: Patient limited by lethargy Patient left: in chair;with call bell/phone within reach;with nursing/sitter in room     Time: 0722-0755 PT Time Calculation (min) (ACUTE ONLY): 33 min  Charges:  $Gait Training: 8-22 mins $Therapeutic Exercise: 8-22 mins  G CodesKingsley Callander 08/28/2014, 8:02 AM   [name], PT, DPT Pager #: [phone] Office #: [phone]

## 2014-08-28 NOTE — Progress Notes (Signed)
Physical Therapy Treatment Patient Details Name: Alyssa Luna MRN: [id number] DOB: March 02, 1934 Today's Date: 08/28/2014    History of Present Illness Pt admitted 1/11 for elective L TKA. Pt with h/o breat cancer, bilat mastectomy, R TKA in 2011.    PT Comments    Pt progressing extremely well. Pt with 90 degree active L knee flexion in sitting. Pt functioning at supervision level and is safe to d/c home with spouse once medically stable.  Follow Up Recommendations  Home health PT;Supervision/Assistance - 24 hour     Equipment Recommendations       Recommendations for Other Services       Precautions / Restrictions Precautions Precautions: Fall Precaution Comments: reviewed Required Braces or Orthoses: Knee Immobilizer - Left Knee Immobilizer - Left: On when out of bed or walking Restrictions Weight Bearing Restrictions: Yes LLE Weight Bearing: Weight bearing as tolerated    Mobility  Bed Mobility Overal bed mobility: Needs Assistance Bed Mobility: Supine to Sit;Sit to Supine     Supine to sit: Min guard Sit to supine: Min guard   General bed mobility comments: pt up in chair  Transfers Overall transfer level: Needs assistance Equipment used: Rolling walker (2 wheeled) Transfers: Sit to/from Stand Sit to Stand: Min guard Stand pivot transfers: Min guard       General transfer comment: increased time but good technique  Ambulation/Gait Ambulation/Gait assistance: Min guard Ambulation Distance (Feet): 175 Feet Assistive device: Rolling walker (2 wheeled) Gait Pattern/deviations: Step-through pattern Gait velocity: slow   General Gait Details: pt initially step to then transitioned to step through. Pt progressively able to increase L LE WBing   Stairs            Wheelchair Mobility    Modified Rankin (Stroke Patients Only)       Balance Overall balance assessment: Needs assistance Sitting-balance support: No upper extremity  supported;Feet supported Sitting balance-Leahy Scale: Fair     Standing balance support: Bilateral upper extremity supported Standing balance-Leahy Scale: Poor                      Cognition Arousal/Alertness: Awake/alert Behavior During Therapy: WFL for tasks assessed/performed Overall Cognitive Status: Within Functional Limits for tasks assessed                      Exercises Total Joint Exercises Ankle Circles/Pumps: AROM;Both;10 reps Quad Sets: AROM;Left;5 reps Heel Slides: AROM;Left;10 reps;Seated Long Arc Quad: AROM;Left;10 reps;Supine    General Comments        Pertinent Vitals/Pain Pain Assessment: 0-10 Pain Score: 5  Pain Location: L knee Pain Descriptors / Indicators: Aching Pain Intervention(s): Monitored during session    Home Living Family/patient expects to be discharged to:: Private residence Living Arrangements: Spouse/significant other Available Help at Discharge: Family;Available 24 hours/day Type of Home: House Home Access: Ramped entrance   Home Layout: One level Home Equipment: Walker - 2 wheels;Bedside commode;Wheelchair - manual      Prior Function Level of Independence: Independent          PT Goals (current goals can now be found in the care plan section) Acute Rehab PT Goals Patient Stated Goal: home this afternoon Progress towards PT goals: Progressing toward goals    Frequency  7X/week    PT Plan Current plan remains appropriate    Co-evaluation             End of Session Equipment Utilized During Treatment: Gait belt Activity  Tolerance: Patient limited by lethargy Patient left: in chair;with call bell/phone within reach;with nursing/sitter in room     Time: 1201-1228 PT Time Calculation (min) (ACUTE ONLY): 27 min  Charges:  $Gait Training: 8-22 mins $Therapeutic Exercise: 8-22 mins                    G Codes:      [name] 08/28/2014, 12:44 PM  [name], PT, DPT Pager #:  [phone] Office #: [phone]

## 2014-08-29 ENCOUNTER — Telehealth: Payer: Self-pay | Admitting: Internal Medicine

## 2014-08-29 NOTE — Telephone Encounter (Signed)
Diane -[phone] [facility]  Need to know about her INR   Pt is being admitted

## 2014-08-29 NOTE — Telephone Encounter (Signed)
Forwarding msg to coumadin clinic...lmb

## 2014-08-29 NOTE — Telephone Encounter (Signed)
Spoke with Diane at [facility].  Last INR drawn yesterday was 1.2.  Instructions given to take 2 tablets today and 2.5 tablets tomorrow and recheck at home on Friday.

## 2014-08-31 ENCOUNTER — Telehealth: Payer: Self-pay | Admitting: Internal Medicine

## 2014-08-31 NOTE — Telephone Encounter (Signed)
Alyssa Luna calling from [facility] with INR/PT info:  1/15 INR 2.3, PT 28 30 mg Lovenox every 12 hrs 1/13 6 mg of Warfarin 2.5 tablets equaling 7.5 mg last pm 1/14  Please call [phone]

## 2014-08-31 NOTE — Telephone Encounter (Signed)
Spoke with Alyssa Luna at [facility].  Instructions given to have pt stop Lovenox and resume normal dose of Coumadin to take 2 tablets daily except take 1 tablet on M/W/F.  Recheck in 1 week.

## 2014-09-04 ENCOUNTER — Telehealth: Payer: Self-pay | Admitting: Internal Medicine

## 2014-09-04 NOTE — Telephone Encounter (Signed)
Pt calling to let PCP know she is having gas pains, cramping, burbing, pt is taking celebrex and oxycodone for her knee replacement. She is wondering if these meds could be causing these symptoms, she started taking both of these a week ago. Pls advise.

## 2014-09-04 NOTE — Telephone Encounter (Signed)
It is possible. Stop Celebrex. Try Oxycodone 1/2 tab prn. Thx

## 2014-09-05 NOTE — Telephone Encounter (Signed)
Notified pt with md response.../lmb 

## 2014-09-06 ENCOUNTER — Telehealth: Payer: Self-pay | Admitting: Internal Medicine

## 2014-09-06 NOTE — Telephone Encounter (Signed)
Coumadin - same dose  INR next week Is Coumadin Clinic involved? Thx

## 2014-09-06 NOTE — Telephone Encounter (Signed)
Alyssa Luna called from [place] to report INR=3.5.

## 2014-09-10 ENCOUNTER — Telehealth: Payer: Self-pay | Admitting: *Deleted

## 2014-09-10 NOTE — Telephone Encounter (Signed)
[place] Night - Client [place] Call [facility] Patient Name: Alyssa Luna Gender: Female DOB: Sep 30, 1933 Age: 81 Y 11 M 29 D Return Phone Number: Address: City/State/Zip: [place] [employer] [facility] [place] Night - Client Client Site [place] - Night Contact Type Call Call Type Page Only Caller Name Diane Relationship To Patient Provider Is this call to report lab results? No Return Phone Number Unavailable Initial Comment Caller is calling from [phone]- Needs to speak with the on-call. Nurse Assessment Guidelines Guideline Title Affirmed Question Affirmed Notes Nurse Date/Time (Eastern Time) Disp. Time Eilene Ghazi Time) Disposition Final User 09/07/2014 11:53:49 AM Send to [facility] Paging Queue Baruch Goldmann 09/07/2014 12:15:16 PM Called On-Call Provider Nyoka Cowden, Amy 09/07/2014 12:18:25 PM Page Completed Yes Nyoka Cowden, Amy After Care Instructions Given Call Event Type User Date / Time Description Paging DoctorName DoctorPhone DateTime Result/Outcome Notes Dr. Alain Marion [phone] 09/07/2014 12:15:16 PM Called On Call Provider - Reached Dr. Alain Marion 09/07/2014 12:18:08 PM Spoke with On Call - General Spoke with Dr. Alain Marion who was on call until 8am since no on call was listed for daytime hours and the phone number for Dr. Maudie Mercury was the wrong number and he is taking care of the page.

## 2014-09-11 LAB — POCT INR: INR: 2.95

## 2014-09-11 LAB — PROTIME-INR: INR: 3 — AB (ref 0.9–1.1)

## 2014-09-11 NOTE — Telephone Encounter (Signed)
Pt informed She has been taking 3 mg M, W, Fri and 2 mg on the other days to surgery. Is this correct?   She states Raliegh Ip checked it today. She will call us with INR.

## 2014-09-12 ENCOUNTER — Telehealth: Payer: Self-pay

## 2014-09-12 ENCOUNTER — Ambulatory Visit (INDEPENDENT_AMBULATORY_CARE_PROVIDER_SITE_OTHER): Payer: Medicare Other

## 2014-09-12 DIAGNOSIS — I4891 Unspecified atrial fibrillation: Secondary | ICD-10-CM

## 2014-09-12 DIAGNOSIS — G459 Transient cerebral ischemic attack, unspecified: Secondary | ICD-10-CM

## 2014-09-12 NOTE — Progress Notes (Signed)
Kristen from Dr. Archie Endo office called to report patient's INR.  INR results:  2.95.  Called patient with results and coumadin dosage recommendation.  Please see Anticoag Track for details.

## 2014-09-12 NOTE — Telephone Encounter (Signed)
Opened in error

## 2014-09-14 ENCOUNTER — Other Ambulatory Visit: Payer: Self-pay | Admitting: Internal Medicine

## 2014-09-18 ENCOUNTER — Telehealth: Payer: Self-pay | Admitting: Family

## 2014-09-18 ENCOUNTER — Other Ambulatory Visit: Payer: Medicare Other

## 2014-09-18 ENCOUNTER — Other Ambulatory Visit: Payer: Self-pay | Admitting: General Practice

## 2014-09-18 ENCOUNTER — Ambulatory Visit (INDEPENDENT_AMBULATORY_CARE_PROVIDER_SITE_OTHER): Payer: Medicare Other | Admitting: General Practice

## 2014-09-18 DIAGNOSIS — I4891 Unspecified atrial fibrillation: Secondary | ICD-10-CM

## 2014-09-18 DIAGNOSIS — G459 Transient cerebral ischemic attack, unspecified: Secondary | ICD-10-CM

## 2014-09-18 DIAGNOSIS — Z5181 Encounter for therapeutic drug level monitoring: Secondary | ICD-10-CM

## 2014-09-18 LAB — PROTIME-INR
INR: 11 ratio (ref 0.8–1.0)
Prothrombin Time: 113.9 s (ref 9.6–13.1)

## 2014-09-18 MED ORDER — PHYTONADIONE 5 MG PO TABS: ORAL_TABLET | ORAL | Status: DC

## 2014-09-18 NOTE — Progress Notes (Signed)
Pre visit review using our clinic review tool, if applicable. No additional management support is needed unless otherwise documented below in the visit note. 

## 2014-09-18 NOTE — Telephone Encounter (Signed)
Please have patient follow up in the next day or two to ensure the INR is coming down.

## 2014-09-19 ENCOUNTER — Ambulatory Visit: Payer: 59 | Admitting: Internal Medicine

## 2014-09-20 NOTE — Progress Notes (Signed)
Agree with plan 

## 2014-09-24 ENCOUNTER — Ambulatory Visit (INDEPENDENT_AMBULATORY_CARE_PROVIDER_SITE_OTHER): Payer: Medicare Other | Admitting: General Practice

## 2014-09-24 ENCOUNTER — Telehealth: Payer: Self-pay | Admitting: Internal Medicine

## 2014-09-24 DIAGNOSIS — N390 Urinary tract infection, site not specified: Secondary | ICD-10-CM

## 2014-09-24 DIAGNOSIS — Z5181 Encounter for therapeutic drug level monitoring: Secondary | ICD-10-CM

## 2014-09-24 LAB — POCT INR: INR: 1.2

## 2014-09-24 NOTE — Progress Notes (Signed)
Pre visit review using our clinic review tool, if applicable. No additional management support is needed unless otherwise documented below in the visit note. 

## 2014-09-24 NOTE — Telephone Encounter (Signed)
Ok UA Thx

## 2014-09-24 NOTE — Telephone Encounter (Signed)
Is requesting lab orders to see if she has a UTI

## 2014-09-25 ENCOUNTER — Other Ambulatory Visit (INDEPENDENT_AMBULATORY_CARE_PROVIDER_SITE_OTHER): Payer: Medicare Other

## 2014-09-25 ENCOUNTER — Telehealth: Payer: Self-pay | Admitting: Internal Medicine

## 2014-09-25 DIAGNOSIS — N390 Urinary tract infection, site not specified: Secondary | ICD-10-CM

## 2014-09-25 LAB — URINALYSIS, ROUTINE W REFLEX MICROSCOPIC
KETONES UR: NEGATIVE
Leukocytes, UA: NEGATIVE
NITRITE: NEGATIVE
PH: 6 (ref 5.0–8.0)
Specific Gravity, Urine: >=1.03 — AB (ref 1.000–1.030)
Urine Glucose: NEGATIVE
Urobilinogen, UA: 0.2 (ref 0.0–1.0)

## 2014-09-25 NOTE — Telephone Encounter (Signed)
Notified pt md ok UA order has been place...Alyssa Luna

## 2014-09-25 NOTE — Telephone Encounter (Signed)
Rec'd from [facility] forward 2 pages to Dr. Alain Marion

## 2014-10-01 ENCOUNTER — Ambulatory Visit: Payer: Medicare Other

## 2014-10-02 ENCOUNTER — Other Ambulatory Visit: Payer: Self-pay | Admitting: General Practice

## 2014-10-02 ENCOUNTER — Encounter: Payer: Self-pay | Admitting: Internal Medicine

## 2014-10-02 ENCOUNTER — Ambulatory Visit (INDEPENDENT_AMBULATORY_CARE_PROVIDER_SITE_OTHER): Payer: Medicare Other | Admitting: Internal Medicine

## 2014-10-02 ENCOUNTER — Ambulatory Visit (INDEPENDENT_AMBULATORY_CARE_PROVIDER_SITE_OTHER): Payer: Medicare Other | Admitting: General Practice

## 2014-10-02 ENCOUNTER — Ambulatory Visit (INDEPENDENT_AMBULATORY_CARE_PROVIDER_SITE_OTHER)
Admission: RE | Admit: 2014-10-02 | Discharge: 2014-10-02 | Disposition: A | Discharge status: Home / Self Care | Payer: Medicare Other | Source: Ambulatory Visit | Attending: Internal Medicine | Admitting: Internal Medicine

## 2014-10-02 ENCOUNTER — Other Ambulatory Visit (INDEPENDENT_AMBULATORY_CARE_PROVIDER_SITE_OTHER): Payer: Medicare Other

## 2014-10-02 VITALS — BP 138/84 | HR 87 | Temp 98.4°F | Wt 151.0 lb

## 2014-10-02 DIAGNOSIS — R1032 Left lower quadrant pain: Secondary | ICD-10-CM

## 2014-10-02 DIAGNOSIS — R609 Edema, unspecified: Secondary | ICD-10-CM

## 2014-10-02 DIAGNOSIS — R110 Nausea: Secondary | ICD-10-CM

## 2014-10-02 DIAGNOSIS — M25462 Effusion, left knee: Secondary | ICD-10-CM | POA: Insufficient documentation

## 2014-10-02 DIAGNOSIS — I4891 Unspecified atrial fibrillation: Secondary | ICD-10-CM

## 2014-10-02 DIAGNOSIS — M5440 Lumbago with sciatica, unspecified side: Secondary | ICD-10-CM

## 2014-10-02 DIAGNOSIS — Z5181 Encounter for therapeutic drug level monitoring: Secondary | ICD-10-CM

## 2014-10-02 LAB — CBC WITH DIFFERENTIAL/PLATELET
Basophils Absolute: 0 10*3/uL (ref 0.0–0.1)
Basophils Relative: 1 % (ref 0.0–3.0)
EOS ABS: 0.2 10*3/uL (ref 0.0–0.7)
EOS PCT: 5.3 % — AB (ref 0.0–5.0)
HCT: 34.5 % — ABNORMAL LOW (ref 36.0–46.0)
HEMOGLOBIN: 11.4 g/dL — AB (ref 12.0–15.0)
LYMPHS PCT: 35.4 % (ref 12.0–46.0)
Lymphs Abs: 1.5 10*3/uL (ref 0.7–4.0)
MCHC: 33 g/dL (ref 30.0–36.0)
MCV: 86.1 fl (ref 78.0–100.0)
Monocytes Absolute: 0.4 10*3/uL (ref 0.1–1.0)
Monocytes Relative: 9.8 % (ref 3.0–12.0)
NEUTROS PCT: 48.5 % (ref 43.0–77.0)
Neutro Abs: 2.1 10*3/uL (ref 1.4–7.7)
Platelets: 357 10*3/uL (ref 150.0–400.0)
RBC: 4.01 Mil/uL (ref 3.87–5.11)
RDW: 14.7 % (ref 11.5–15.5)
WBC: 4.4 10*3/uL (ref 4.0–10.5)

## 2014-10-02 LAB — HEPATIC FUNCTION PANEL
ALK PHOS: 112 U/L (ref 39–117)
ALT: 9 U/L (ref 0–35)
AST: 18 U/L (ref 0–37)
Albumin: 3.9 g/dL (ref 3.5–5.2)
BILIRUBIN DIRECT: 0.1 mg/dL (ref 0.0–0.3)
BILIRUBIN TOTAL: 0.6 mg/dL (ref 0.2–1.2)
Total Protein: 7 g/dL (ref 6.0–8.3)

## 2014-10-02 LAB — BASIC METABOLIC PANEL
BUN: 10 mg/dL (ref 6–23)
CALCIUM: 9.4 mg/dL (ref 8.4–10.5)
CO2: 28 mEq/L (ref 19–32)
CREATININE: 0.69 mg/dL (ref 0.40–1.20)
Chloride: 102 mEq/L (ref 96–112)
GFR: 87.22 mL/min (ref >60.00)
GLUCOSE: 102 mg/dL — AB (ref 70–99)
Potassium: 4.3 mEq/L (ref 3.5–5.1)
Sodium: 136 mEq/L (ref 135–145)

## 2014-10-02 LAB — LIPASE: LIPASE: 37 U/L (ref 11.0–59.0)

## 2014-10-02 LAB — SEDIMENTATION RATE: Sed Rate: 29 mm/hr — ABNORMAL HIGH (ref 0–22)

## 2014-10-02 LAB — POCT INR: INR: 3.4

## 2014-10-02 MED ORDER — ONDANSETRON HCL 4 MG PO TABS: 4.0000 mg | ORAL_TABLET | Freq: Three times a day (TID) | ORAL | Status: DC | PRN

## 2014-10-02 MED ORDER — TIZANIDINE HCL 2 MG PO CAPS: 2.0000 mg | ORAL_CAPSULE | Freq: Three times a day (TID) | ORAL | Status: DC | PRN

## 2014-10-02 MED ORDER — TRAMADOL HCL 50 MG PO TABS: 50.0000 mg | ORAL_TABLET | Freq: Four times a day (QID) | ORAL | Status: DC | PRN

## 2014-10-02 NOTE — Progress Notes (Signed)
Agree with plan 

## 2014-10-02 NOTE — Assessment & Plan Note (Signed)
Tramadol, Zanaflex prn

## 2014-10-02 NOTE — Assessment & Plan Note (Signed)
2/16 ?due to Oxycodone vs other Abd X ray Labs D/c Oxycodone, Xanaflex Will try Tramadol prn. Zofran prn

## 2014-10-02 NOTE — Assessment & Plan Note (Addendum)
2/16:  post-op TKR 1/16 Dr Noemi Chapel  F/u w/Dr Noemi Chapel this week - needs another knee tap Labs - D dimer, ESR Tramadol prn

## 2014-10-02 NOTE — Progress Notes (Signed)
Pre visit review using our clinic review tool, if applicable. No additional management support is needed unless otherwise documented below in the visit note. 

## 2014-10-02 NOTE — Progress Notes (Signed)
Subjective:    HPI  C/o L knee pain - she saw Dr Noemi Chapel, had Prednisone taper - not better  S/p L TKR 08/27/14 by Dr Noemi Chapel. L knee was tapped a few weeks ago C/o nausea, LLQ abd discomfort on and on  C/o INR was 11 two wks ago, now is 3.2  C/o LBP - chronic - worse after surgery from moving and using a walker. Seen by Ortho - given Pednisone 1 wk ago, Zanaflex  F/u bad arthralgias due to Simvastatin - resolved off of it  F/u abd bloating and pain on L side - recurrent   The patient presents for a follow-up of  chronic hypertension, chronic dyslipidemia, A fib, tremor. She is taking Lipitor 1/2 tab qd  Her HR, BPs - OK at home  BP Readings from Last 3 Encounters:  10/02/14 138/84  08/28/14 106/58  07/03/14 120/60   Wt Readings from Last 3 Encounters:  10/02/14 151 lb (68.493 kg)  08/27/14 160 lb (72.576 kg)  07/03/14 159 lb (72.122 kg)   Past Medical History  Diagnosis Date  . Colon polyps   . Diverticulosis of colon   . GERD (gastroesophageal reflux disease)   . Osteoporosis   . TR (tricuspid regurgitation)     Mild  . Vitamin B12 deficiency   . Vitamin D deficiency   . History of breast cancer   . Hyperlipidemia   . Familial tremor   . LBP (low back pain)   . Cataract   . Atrial fibrillation     D [place]  . TIA (transient ischemic attack)   . Hemorrhoids   . Breast cancer 1984  . Hypertension   . Dysrhythmia     atrial fib  . Anxiety     has lorazepam on hand for nervousness, pt. reports that she had a break-in to her home on 05/2014  . History of hiatal hernia   . Neuromuscular disorder     essential tremor  . Osteoarthritis     hands & back & knees   . Primary localized osteoarthritis of left knee    Past Surgical History  Procedure Laterality Date  . Breast enhancement surgery    . Cataract extraction    . Total knee arthroplasty  Dec 2011    Right - Dr Noemi Chapel  . Bilateral mastectomy    . Polypectomy    . Colonoscopy    . Vaginal  hysterectomy      [facility] BSO  . Oophorectomy      BSO  . Breast surgery      Bilateral mastectomy  . Augmentation mammaplasty    . Mastectomy  1984    bilateral  . Eye surgery      cataracts removed- /w iol  & blepheroplasty  . Joint replacement Right   . Total knee arthroplasty Left 08/27/2014    dr Noemi Chapel  . Total knee arthroplasty Left 08/27/2014    Procedure: LEFT TOTAL KNEE ARTHROPLASTY;  Surgeon: Lorn Junes, MD;  Location: [facility];  Service: Orthopedics;  Laterality: Left;    reports that she has never smoked. She has never used smokeless tobacco. She reports that she does not drink alcohol or use illicit drugs. family history includes Breast cancer (age of onset: 6) in her mother; Colon cancer in her maternal aunt; Ovarian cancer in her maternal grandmother; Tremor in her brother and mother. There is no history of Early death or Stroke. Allergies  Allergen Reactions  . Ciprofloxacin  GI upset   . Simvastatin     REACTION: leg cramps, weakness   Current Outpatient Prescriptions on File Prior to Visit  Medication Sig Dispense Refill  . acetaminophen (TYLENOL) 500 MG tablet Take 500 mg by mouth as needed for mild pain.     . carvedilol (COREG) 3.125 MG tablet Take 1 tablet (3.125 mg total) by mouth 2 (two) times daily. 180 tablet 3  . Cholecalciferol 1000 UNITS tablet Take 1,000 Units by mouth daily. D-3    . Cyanocobalamin (VITAMIN B-12 CR) 1000 MCG TBCR Take 1,000 mcg by mouth daily.     [name] docusate sodium (COLACE) 100 MG capsule 1 tab 2 times a day while on narcotics.  STOOL SOFTENER 60 capsule 0  . flecainide (TAMBOCOR) 50 MG tablet Take 1 and 1/2 tablets by  mouth two times daily 270 tablet 2  . furosemide (LASIX) 20 MG tablet Take 1-2 tablets (20-40 mg total) by mouth daily as needed for edema. 60 tablet 2  . LORazepam (ATIVAN) 0.5 MG tablet Take 1 tablet (0.5 mg total) by mouth 2 (two) times daily as needed for anxiety or sleep. 60 tablet 0  . omeprazole (PRILOSEC)  20 MG capsule Take 20 mg by mouth daily.      [name] oxyCODONE (OXY IR/ROXICODONE) 5 MG immediate release tablet 1-2 tablets every 4-6 hrs as needed for pain 100 tablet 0  . phytonadione (MEPHYTON) 5 MG tablet Take 1/2 tablet (2.5 mg) Now! 1 tablet 0  . polyethylene glycol (MIRALAX / GLYCOLAX) packet Take 17 g by mouth 2 (two) times daily. 60 each 0  . pravastatin (PRAVACHOL) 20 MG tablet Take 1 tablet (20 mg total) by mouth daily. 30 tablet 5  . tizanidine (ZANAFLEX) 2 MG capsule Take 2 mg by mouth 3 (three) times daily.    [name] warfarin (COUMADIN) 3 MG tablet Take as directed by the  coumadin clinic. 180 tablet 3   No current facility-administered medications on file prior to visit.    BP 138/84 mmHg  Pulse 87  Temp(Src) 98.4 F (36.9 C) (Oral)  Wt 151 lb (68.493 kg)  SpO2 98%   Review of Systems  Constitutional: Positive for fatigue. Negative for activity change, appetite change and unexpected weight change.  HENT: Negative for congestion, mouth sores and sinus pressure.   Eyes: Negative for visual disturbance.  Respiratory: Negative for chest tightness.   Gastrointestinal: Negative for nausea and abdominal pain.  Genitourinary: Negative for frequency, difficulty urinating and vaginal pain.  Musculoskeletal: Positive for back pain and arthralgias. Negative for gait problem.  Skin: Negative for pallor.  Neurological: Positive for tremors. Negative for dizziness, weakness and numbness.  Psychiatric/Behavioral: Negative for confusion and sleep disturbance.       Objective:   Physical Exam  Constitutional: She appears well-developed and well-nourished. No distress.  HENT:  Head: Normocephalic.  Right Ear: External ear normal.  Left Ear: External ear normal.  Nose: Nose normal.  Mouth/Throat: Oropharynx is clear and moist.  Eyes: Conjunctivae are normal. Pupils are equal, round, and reactive to light. Right eye exhibits no discharge. Left eye exhibits no discharge.  Neck: Normal  range of motion. Neck supple. No JVD present. No tracheal deviation present. No thyromegaly present.  Cardiovascular: Normal rate and normal heart sounds.   irreg irreg  Pulmonary/Chest: No stridor. No respiratory distress. She has no wheezes.  Abdominal: Soft. Bowel sounds are normal. She exhibits no distension and no mass. There is no tenderness. There is no rebound and  no guarding.  Musculoskeletal: She exhibits edema. She exhibits no tenderness.  Lymphadenopathy:    She has no cervical adenopathy.  Neurological: She displays normal reflexes. No cranial nerve deficit. She exhibits normal muscle tone. Coordination normal.  Skin: No rash noted. No erythema.  Psychiatric: She has a normal mood and affect. Her behavior is normal. Judgment and thought content normal.  mild tremor L knee is large w/effusion, warm; scar is ok L calf NT L ankle w/trace edema   Lab Results  Component Value Date   WBC 8.6 08/28/2014   HGB 9.8* 08/28/2014   HCT 29.6* 08/28/2014   PLT 215 08/28/2014   GLUCOSE 171* 08/28/2014   CHOL 246* 02/07/2014   TRIG 152.0* 02/07/2014   HDL 39.90 02/07/2014   LDLDIRECT 178.0 09/24/2011   LDLCALC 176* 02/07/2014   ALT 22 08/16/2014   AST 27 08/16/2014   NA 131* 08/28/2014   K 4.3 08/28/2014   CL 101 08/28/2014   CREATININE 0.73 08/28/2014   BUN 8 08/28/2014   CO2 25 08/28/2014   TSH 2.44 02/07/2014   INR 3.4 10/02/2014     A complex case     Assessment & Plan:

## 2014-10-02 NOTE — Assessment & Plan Note (Addendum)
?  etiology Labs X ray abd 1 view

## 2014-10-02 NOTE — Assessment & Plan Note (Signed)
LLE D-dimer; US doppler if D-dimer is up

## 2014-10-03 ENCOUNTER — Telehealth: Payer: Self-pay | Admitting: *Deleted

## 2014-10-03 ENCOUNTER — Other Ambulatory Visit: Payer: Self-pay | Admitting: Internal Medicine

## 2014-10-03 DIAGNOSIS — M7989 Other specified soft tissue disorders: Secondary | ICD-10-CM

## 2014-10-03 LAB — D-DIMER, QUANTITATIVE: D-Dimer, Quant: 4.32 ug/mL-FEU — ABNORMAL HIGH (ref 0.00–0.48)

## 2014-10-03 NOTE — Telephone Encounter (Signed)
Received call from [place] with [facility] CRITICAL D-DIMER 4.32.../LMB

## 2014-10-03 NOTE — Telephone Encounter (Signed)
Doppler ordered Thx

## 2014-10-05 ENCOUNTER — Encounter (HOSPITAL_COMMUNITY): Payer: Self-pay | Admitting: *Deleted

## 2014-10-05 ENCOUNTER — Telehealth: Payer: Self-pay | Admitting: Internal Medicine

## 2014-10-05 ENCOUNTER — Emergency Department (HOSPITAL_COMMUNITY)
Admission: EM | Admit: 2014-10-05 | Discharge: 2014-10-06 | Disposition: A | Discharge status: Home / Self Care | Payer: Medicare Other | Attending: Emergency Medicine | Admitting: Emergency Medicine

## 2014-10-05 DIAGNOSIS — Z8719 Personal history of other diseases of the digestive system: Secondary | ICD-10-CM | POA: Diagnosis not present

## 2014-10-05 DIAGNOSIS — Z8673 Personal history of transient ischemic attack (TIA), and cerebral infarction without residual deficits: Secondary | ICD-10-CM | POA: Diagnosis not present

## 2014-10-05 DIAGNOSIS — I2510 Atherosclerotic heart disease of native coronary artery without angina pectoris: Secondary | ICD-10-CM | POA: Insufficient documentation

## 2014-10-05 DIAGNOSIS — R7989 Other specified abnormal findings of blood chemistry: Secondary | ICD-10-CM | POA: Diagnosis present

## 2014-10-05 DIAGNOSIS — M7989 Other specified soft tissue disorders: Secondary | ICD-10-CM | POA: Diagnosis not present

## 2014-10-05 DIAGNOSIS — Z720 Tobacco use: Secondary | ICD-10-CM | POA: Diagnosis not present

## 2014-10-05 DIAGNOSIS — Z792 Long term (current) use of antibiotics: Secondary | ICD-10-CM | POA: Insufficient documentation

## 2014-10-05 HISTORY — DX: Cerebral infarction, unspecified: I63.9

## 2014-10-05 HISTORY — DX: Atherosclerotic heart disease of native coronary artery without angina pectoris: I25.10

## 2014-10-05 HISTORY — DX: Unspecified atrial fibrillation: I48.91

## 2014-10-05 LAB — COMPREHENSIVE METABOLIC PANEL
ALT: 12 U/L (ref 0–35)
AST: 20 U/L (ref 0–37)
Albumin: 3.7 g/dL (ref 3.5–5.2)
Alkaline Phosphatase: 98 U/L (ref 39–117)
Anion gap: 9 (ref 5–15)
BILIRUBIN TOTAL: 0.7 mg/dL (ref 0.3–1.2)
BUN: 6 mg/dL (ref 6–23)
CHLORIDE: 102 mmol/L (ref 96–112)
CO2: 24 mmol/L (ref 19–32)
Calcium: 9.4 mg/dL (ref 8.4–10.5)
Creatinine, Ser: 0.69 mg/dL (ref 0.50–1.10)
GFR calc Af Amer: >90 mL/min (ref >90)
GFR, EST NON AFRICAN AMERICAN: 81 mL/min — AB (ref >90)
Glucose, Bld: 97 mg/dL (ref 70–99)
Potassium: 3.8 mmol/L (ref 3.5–5.1)
Sodium: 135 mmol/L (ref 135–145)
Total Protein: 6.5 g/dL (ref 6.0–8.3)

## 2014-10-05 LAB — CBC WITH DIFFERENTIAL/PLATELET
BASOS PCT: 1 % (ref 0–1)
Basophils Absolute: 0 10*3/uL (ref 0.0–0.1)
EOS ABS: 0.2 10*3/uL (ref 0.0–0.7)
EOS PCT: 5 % (ref 0–5)
HCT: 34.2 % — ABNORMAL LOW (ref 36.0–46.0)
Hemoglobin: 11 g/dL — ABNORMAL LOW (ref 12.0–15.0)
LYMPHS ABS: 1.5 10*3/uL (ref 0.7–4.0)
Lymphocytes Relative: 39 % (ref 12–46)
MCH: 27.8 pg (ref 26.0–34.0)
MCHC: 32.2 g/dL (ref 30.0–36.0)
MCV: 86.4 fL (ref 78.0–100.0)
MONOS PCT: 10 % (ref 3–12)
Monocytes Absolute: 0.4 10*3/uL (ref 0.1–1.0)
NEUTROS PCT: 45 % (ref 43–77)
Neutro Abs: 1.8 10*3/uL (ref 1.7–7.7)
Platelets: 333 10*3/uL (ref 150–400)
RBC: 3.96 MIL/uL (ref 3.87–5.11)
RDW: 13.5 % (ref 11.5–15.5)
WBC: 3.8 10*3/uL — AB (ref 4.0–10.5)

## 2014-10-05 LAB — D-DIMER, QUANTITATIVE (NOT AT ARMC): D DIMER QUANT: 3.32 ug{FEU}/mL — AB (ref 0.00–0.48)

## 2014-10-05 LAB — PROTIME-INR
INR: 3.02 — AB (ref 0.00–1.49)
PROTHROMBIN TIME: 31.5 s — AB (ref 11.6–15.2)

## 2014-10-05 NOTE — Telephone Encounter (Signed)
I called pt. She c/o LLQ abdomen feeling inflamed and burning. MD advises going to [facility] ER to have abdominal pain and elevated D Dimer evaluated. See 10/02/14 labs.

## 2014-10-05 NOTE — ED Notes (Signed)
Vital rechecked.  Pt took  Her own med for pain.  Lt knee swollen and painful

## 2014-10-05 NOTE — Telephone Encounter (Signed)
Alyssa Luna,  Pt called in said that her insides still feel like they are inflamed.  She wanted to know if you could give her a call back and tell her if she needs to go to ER or what she should do.  She was just seen yesterday for the same thing with Dr Camila Li

## 2014-10-05 NOTE — ED Notes (Signed)
The pt was seen by her doctor on Wednesday and had blood drawn.  She was called today and tiold that her d-dimer was high.   Lt knee is swollen and painful.  Jan 11th she had a lt knee replaced.  She has had   Pt problems.  She is also c/o lt lat abd pain.  diarrhea

## 2014-10-06 ENCOUNTER — Ambulatory Visit (HOSPITAL_COMMUNITY)
Admission: RE | Admit: 2014-10-06 | Discharge: 2014-10-06 | Disposition: A | Discharge status: Home / Self Care | Payer: Medicare Other | Source: Ambulatory Visit | Attending: Emergency Medicine | Admitting: Emergency Medicine

## 2014-10-06 DIAGNOSIS — M7989 Other specified soft tissue disorders: Secondary | ICD-10-CM | POA: Diagnosis not present

## 2014-10-06 MED ORDER — AMOXICILLIN-POT CLAVULANATE 875-125 MG PO TABS: 1.0000 | ORAL_TABLET | Freq: Two times a day (BID) | ORAL | Status: DC

## 2014-10-06 MED ORDER — METRONIDAZOLE 500 MG PO TABS: 500.0000 mg | ORAL_TABLET | Freq: Two times a day (BID) | ORAL | Status: DC

## 2014-10-06 MED ORDER — CIPROFLOXACIN HCL 500 MG PO TABS: 500.0000 mg | ORAL_TABLET | Freq: Two times a day (BID) | ORAL | Status: DC

## 2014-10-06 NOTE — ED Notes (Signed)
Dr Thelma Comp at bedside.

## 2014-10-06 NOTE — Discharge Instructions (Signed)
Ultrasound of your leg ordered, to be done at [facility]. Antibiotics prescribed, ONLY TAKE IT IF THERE IS LOWER ABDOMINAL PAIN AND FEVERS. UNDERSTAND - that meda can interfere with coumadin level, so please consult pharmacist and your doctor if you are to start the antibiotics.  Diverticulitis Diverticulitis is inflammation or infection of small pouches in your colon that form when you have a condition called diverticulosis. The pouches in your colon are called diverticula. Your colon, or large intestine, is where water is absorbed and stool is formed. Complications of diverticulitis can include:  Bleeding.  Severe infection.  Severe pain.  Perforation of your colon.  Obstruction of your colon. CAUSES  Diverticulitis is caused by bacteria. Diverticulitis happens when stool becomes trapped in diverticula. This allows bacteria to grow in the diverticula, which can lead to inflammation and infection. RISK FACTORS People with diverticulosis are at risk for diverticulitis. Eating a diet that does not include enough fiber from fruits and vegetables may make diverticulitis more likely to develop. SYMPTOMS  Symptoms of diverticulitis may include:  Abdominal pain and tenderness. The pain is normally located on the left side of the abdomen, but may occur in other areas.  Fever and chills.  Bloating.  Cramping.  Nausea.  Vomiting.  Constipation.  Diarrhea.  Blood in your stool. DIAGNOSIS  Your health care provider will ask you about your medical history and do a physical exam. You may need to have tests done because many medical conditions can cause the same symptoms as diverticulitis. Tests may include:  Blood tests.  Urine tests.  Imaging tests of the abdomen, including X-rays and CT scans. When your condition is under control, your health care provider may recommend that you have a colonoscopy. A colonoscopy can show how severe your diverticula are and whether something  else is causing your symptoms. TREATMENT  Most cases of diverticulitis are mild and can be treated at home. Treatment may include:  Taking over-the-counter pain medicines.  Following a clear liquid diet.  Taking antibiotic medicines by mouth for 7-10 days. More severe cases may be treated at a hospital. Treatment may include:  Not eating or drinking.  Taking prescription pain medicine.  Receiving antibiotic medicines through an IV tube.  Receiving fluids and nutrition through an IV tube.  Surgery. HOME CARE INSTRUCTIONS   Follow your health care provider's instructions carefully.  Follow a full liquid diet or other diet as directed by your health care provider. After your symptoms improve, your health care provider may tell you to change your diet. He or she may recommend you eat a high-fiber diet. Fruits and vegetables are good sources of fiber. Fiber makes it easier to pass stool.  Take fiber supplements or probiotics as directed by your health care provider.  Only take medicines as directed by your health care provider.  Keep all your follow-up appointments. SEEK MEDICAL CARE IF:   Your pain does not improve.  You have a hard time eating food.  Your bowel movements do not return to normal. SEEK IMMEDIATE MEDICAL CARE IF:   Your pain becomes worse.  Your symptoms do not get better.  Your symptoms suddenly get worse.  You have a fever.  You have repeated vomiting.  You have bloody or black, tarry stools. MAKE SURE YOU:   Understand these instructions.  Will watch your condition.  Will get help right away if you are not doing well or get worse. Document Released: 05/13/2005 Document Revised: 08/08/2013 Document Reviewed: 06/28/2013 ExitCare  Patient Information 2015 [place]. This information is not intended to replace advice given to you by your health care provider. Make sure you discuss any questions you have with your health care provider.

## 2014-10-06 NOTE — ED Provider Notes (Signed)
CSN: [id number]     Arrival date & time 10/05/14  1833 History   First MD Initiated Contact with Patient 10/05/14 2322     This chart was scribed for Varney Biles, MD by Forrestine Him, ED Scribe. This patient was seen in room B17C/B17C and the patient's care was started 12:42 AM.   Chief Complaint  Patient presents with  . Abnormal Lab   The history is provided by the patient. No language interpreter was used.    HPI Comments: Alyssa Luna is a 81 y.o. female with a PMHx of diverticulitis, CAD, stroke, and A-Fib who presents to the Emergency Department here for abnormal labs this evening. Pt was sent by PCP 2/17 and had blood drawn. Results came back today with elevated d-dimer. In addition, pt is currently on Coumadin an says Coumadin levels have been sporadic. Pt states PCP is attempting to regulate levels with last PTINR reading 3.4. She reports ongoing constant swelling and redness to L knee. However, pt underwent L knee replacement surgery 1/11 performed by Dr. Para March and states symptoms have been persistent since after surgery. Alyssa Luna undergoes physical therapy daily and states symptoms typically worsen temporarily after exercises. No recent numbness or tingling. Pt also mentions constant, ongoing diarrhea she attributes to taking Miralax. She admits to 5 episodes in last 24 hours. No dysuria or fever. Pt with known allergies to ciprofloxacin or flagyl.  She is followed by Dr. Lew Dawes PCP  Past Medical History  Diagnosis Date  . Coronary artery disease   . AF (atrial fibrillation)   . Stroke    History reviewed. No pertinent past surgical history. No family history on file. History  Substance Use Topics  . Smoking status: Current Every Day Smoker  . Smokeless tobacco: Not on file  . Alcohol Use: No   OB History    No data available     Review of Systems    Allergies  Ciprofloxacin and Flagyl  Home Medications   Prior to Admission medications    Medication Sig Start Date End Date Taking? Authorizing Provider  amoxicillin-clavulanate (AUGMENTIN) 875-125 MG per tablet Take 1 tablet by mouth 2 (two) times daily. 10/06/14   Varney Biles, MD   Triage Vitals: BP 126/56 mmHg  Pulse 68  Temp(Src) 98.1 F (36.7 C) (Oral)  Resp 16  SpO2 97%   Physical Exam  Constitutional: She is oriented to person, place, and time. She appears well-developed and well-nourished. No distress.  HENT:  Head: Normocephalic and atraumatic.  Eyes: EOM are normal.  Neck: Normal range of motion.  Cardiovascular: Normal rate, regular rhythm and normal heart sounds.   1 plus dorsalis pedis   Pulmonary/Chest: Effort normal and breath sounds normal.  Abdominal: Soft. She exhibits no distension. There is no tenderness.  Musculoskeletal: Normal range of motion.  Blood flow to extremity  Unilateral swelling with erythema and callor No tenderness to palpation ROM limited  Neurological: She is alert and oriented to person, place, and time.  Skin: Skin is warm and dry.  Psychiatric: She has a normal mood and affect. Judgment normal.  Nursing note and vitals reviewed.   ED Course  Procedures (including critical care time)  DIAGNOSTIC STUDIES: Oxygen Saturation is 97% on RA, adequate by my interpretation.    COORDINATION OF CARE: 12:44 AM-Discussed treatment plan with pt at bedside and pt agreed to plan.    Labs Review Labs Reviewed  CBC WITH DIFFERENTIAL/PLATELET - Abnormal; Notable for the following:  WBC 3.8 (*)    Hemoglobin 11.0 (*)    HCT 34.2 (*)    All other components within normal limits  D-DIMER, QUANTITATIVE - Abnormal; Notable for the following:    D-Dimer, Quant 3.32 (*)    All other components within normal limits  COMPREHENSIVE METABOLIC PANEL - Abnormal; Notable for the following:    GFR calc non Af Amer 81 (*)    All other components within normal limits  PROTIME-INR - Abnormal; Notable for the following:    Prothrombin Time  31.5 (*)    INR 3.02 (*)    All other components within normal limits    Imaging Review No results found.   EKG Interpretation None      MDM   Final diagnoses:  Left leg swelling    I personally performed the services described in this documentation, which was scribed in my presence. The recorded information has been reviewed and is accurate.  Pt comes in with cc of leg swelling. Seen by PCP - dimer was elevated. US DVT ordered, and is neg for DVT. Pt is s/p recent knee surgery. Orthopedics have already evaluated for septic arthritis, and results are neg. No signs of infection. Will d/c.  Varney Biles, MD 10/09/14 [phone]

## 2014-10-06 NOTE — Progress Notes (Signed)
VASCULAR LAB PRELIMINARY  PRELIMINARY  PRELIMINARY  PRELIMINARY  Left lower extremity venous Doppler completed.    Preliminary report:  There is no DVT or SVT noted in the left lower extremity.   Charvez Voorhies, RVT 10/06/2014, 11:35 AM

## 2014-10-10 ENCOUNTER — Ambulatory Visit (INDEPENDENT_AMBULATORY_CARE_PROVIDER_SITE_OTHER): Payer: Medicare Other | Admitting: General Practice

## 2014-10-10 DIAGNOSIS — I4891 Unspecified atrial fibrillation: Secondary | ICD-10-CM

## 2014-10-10 LAB — POCT INR: INR: 3.7

## 2014-10-10 NOTE — Progress Notes (Signed)
Pre visit review using our clinic review tool, if applicable. No additional management support is needed unless otherwise documented below in the visit note. 

## 2014-10-10 NOTE — Progress Notes (Signed)
Agree with plan 

## 2014-10-11 ENCOUNTER — Ambulatory Visit: Payer: Medicare Other

## 2014-10-12 ENCOUNTER — Encounter: Payer: Self-pay | Admitting: Internal Medicine

## 2014-10-14 ENCOUNTER — Other Ambulatory Visit: Payer: Self-pay | Admitting: Internal Medicine

## 2014-10-16 ENCOUNTER — Ambulatory Visit (INDEPENDENT_AMBULATORY_CARE_PROVIDER_SITE_OTHER): Payer: Medicare Other | Admitting: General Practice

## 2014-10-16 ENCOUNTER — Ambulatory Visit (INDEPENDENT_AMBULATORY_CARE_PROVIDER_SITE_OTHER): Payer: Medicare Other | Admitting: Internal Medicine

## 2014-10-16 ENCOUNTER — Other Ambulatory Visit: Payer: Medicare Other

## 2014-10-16 VITALS — BP 142/80 | HR 71 | Temp 97.4°F | Wt 145.8 lb

## 2014-10-16 DIAGNOSIS — R21 Rash and other nonspecific skin eruption: Secondary | ICD-10-CM

## 2014-10-16 DIAGNOSIS — I480 Paroxysmal atrial fibrillation: Secondary | ICD-10-CM

## 2014-10-16 DIAGNOSIS — R609 Edema, unspecified: Secondary | ICD-10-CM

## 2014-10-16 DIAGNOSIS — I4891 Unspecified atrial fibrillation: Secondary | ICD-10-CM

## 2014-10-16 DIAGNOSIS — R197 Diarrhea, unspecified: Secondary | ICD-10-CM

## 2014-10-16 LAB — POCT INR: INR: 2.9

## 2014-10-16 MED ORDER — METHYLPREDNISOLONE ACETATE 80 MG/ML IJ SUSP
80.0000 mg | Freq: Once | INTRAMUSCULAR | Status: AC
  Administered 2014-10-16: 80 mg via INTRAMUSCULAR

## 2014-10-16 MED ORDER — OXYCODONE HCL 5 MG PO TABS: 5.0000 mg | ORAL_TABLET | Freq: Four times a day (QID) | ORAL | Status: DC | PRN

## 2014-10-16 MED ORDER — DIPHENOXYLATE-ATROPINE 2.5-0.025 MG PO TABS: 1.0000 | ORAL_TABLET | Freq: Four times a day (QID) | ORAL | Status: DC | PRN

## 2014-10-16 NOTE — Assessment & Plan Note (Signed)
Will check stool for C diff Lomotil prn
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
## 2014-10-16 NOTE — Progress Notes (Signed)
Pre visit review using our clinic review tool, if applicable. No additional management support is needed unless otherwise documented below in the visit note. 

## 2014-10-16 NOTE — Progress Notes (Signed)
Agree with plan 

## 2014-10-16 NOTE — Assessment & Plan Note (Signed)
Use Furosemide x 2-3 days

## 2014-10-16 NOTE — Assessment & Plan Note (Signed)
On Coumadin  INR today

## 2014-10-16 NOTE — Patient Instructions (Addendum)
Use Furosemide x 2-3 days  Take Lomotil for diarrhea   No milk products, no Boost for 2 weeks --  Milk free trial (no milk, ice cream, cheese and yogurt) for 4-6 weeks. OK to use almond, coconut, rice milk. "Almond breeze" brand tastes good.

## 2014-10-17 ENCOUNTER — Other Ambulatory Visit: Payer: Medicare Other

## 2014-10-17 ENCOUNTER — Other Ambulatory Visit: Payer: Self-pay | Admitting: Internal Medicine

## 2014-10-17 DIAGNOSIS — R197 Diarrhea, unspecified: Secondary | ICD-10-CM

## 2014-10-18 LAB — C. DIFFICILE GDH AND TOXIN A/B
C. DIFF TOXIN A/B: NOT DETECTED
C. difficile GDH: NOT DETECTED

## 2014-10-19 ENCOUNTER — Ambulatory Visit (INDEPENDENT_AMBULATORY_CARE_PROVIDER_SITE_OTHER): Payer: Medicare Other | Admitting: Internal Medicine

## 2014-10-19 ENCOUNTER — Telehealth: Payer: Self-pay | Admitting: Internal Medicine

## 2014-10-19 ENCOUNTER — Encounter: Payer: Self-pay | Admitting: Internal Medicine

## 2014-10-19 VITALS — BP 150/90 | HR 66 | Wt 145.0 lb

## 2014-10-19 DIAGNOSIS — R5383 Other fatigue: Secondary | ICD-10-CM

## 2014-10-19 DIAGNOSIS — R609 Edema, unspecified: Secondary | ICD-10-CM

## 2014-10-19 DIAGNOSIS — R110 Nausea: Secondary | ICD-10-CM

## 2014-10-19 DIAGNOSIS — E785 Hyperlipidemia, unspecified: Secondary | ICD-10-CM

## 2014-10-19 MED ORDER — FUROSEMIDE 20 MG PO TABS: 20.0000 mg | ORAL_TABLET | Freq: Every day | ORAL | Status: DC | PRN

## 2014-10-19 NOTE — Progress Notes (Signed)
Pre visit review using our clinic review tool, if applicable. No additional management support is needed unless otherwise documented below in the visit note. 
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
## 2014-10-19 NOTE — Assessment & Plan Note (Signed)
Dr Archie Endo office d/c'd Oxycod and filled Dupont today Pt will start [employer]

## 2014-10-19 NOTE — Telephone Encounter (Signed)
emmi emailed °

## 2014-10-19 NOTE — Assessment & Plan Note (Signed)
3/16 much better after a Depomedrol IM inj

## 2014-10-30 ENCOUNTER — Ambulatory Visit (INDEPENDENT_AMBULATORY_CARE_PROVIDER_SITE_OTHER): Payer: Medicare Other | Admitting: Internal Medicine

## 2014-10-30 ENCOUNTER — Other Ambulatory Visit (INDEPENDENT_AMBULATORY_CARE_PROVIDER_SITE_OTHER): Payer: Medicare Other

## 2014-10-30 ENCOUNTER — Ambulatory Visit: Payer: Self-pay | Admitting: Internal Medicine

## 2014-10-30 ENCOUNTER — Encounter: Payer: Self-pay | Admitting: Internal Medicine

## 2014-10-30 VITALS — BP 150/88 | HR 68 | Temp 98.4°F | Wt 141.0 lb

## 2014-10-30 DIAGNOSIS — R609 Edema, unspecified: Secondary | ICD-10-CM

## 2014-10-30 DIAGNOSIS — R110 Nausea: Secondary | ICD-10-CM | POA: Diagnosis not present

## 2014-10-30 DIAGNOSIS — R5383 Other fatigue: Secondary | ICD-10-CM

## 2014-10-30 DIAGNOSIS — I10 Essential (primary) hypertension: Secondary | ICD-10-CM | POA: Diagnosis not present

## 2014-10-30 DIAGNOSIS — E785 Hyperlipidemia, unspecified: Secondary | ICD-10-CM

## 2014-10-30 DIAGNOSIS — M25462 Effusion, left knee: Secondary | ICD-10-CM | POA: Diagnosis not present

## 2014-10-30 LAB — CBC WITH DIFFERENTIAL/PLATELET
BASOS PCT: 0.5 % (ref 0.0–3.0)
Basophils Absolute: 0 10*3/uL (ref 0.0–0.1)
EOS PCT: 3.6 % (ref 0.0–5.0)
Eosinophils Absolute: 0.2 10*3/uL (ref 0.0–0.7)
HCT: 36.7 % (ref 36.0–46.0)
Hemoglobin: 12.1 g/dL (ref 12.0–15.0)
LYMPHS PCT: 35.9 % (ref 12.0–46.0)
Lymphs Abs: 1.7 10*3/uL (ref 0.7–4.0)
MCHC: 33 g/dL (ref 30.0–36.0)
MCV: 80.9 fl (ref 78.0–100.0)
MONOS PCT: 9.7 % (ref 3.0–12.0)
Monocytes Absolute: 0.5 10*3/uL (ref 0.1–1.0)
NEUTROS PCT: 50.3 % (ref 43.0–77.0)
Neutro Abs: 2.3 10*3/uL (ref 1.4–7.7)
Platelets: 300 10*3/uL (ref 150.0–400.0)
RBC: 4.53 Mil/uL (ref 3.87–5.11)
RDW: 14.8 % (ref 11.5–15.5)
WBC: 4.6 10*3/uL (ref 4.0–10.5)

## 2014-10-30 LAB — LIPID PANEL
CHOLESTEROL: 189 mg/dL (ref 0–200)
HDL: 41.3 mg/dL (ref >39.00)
LDL Cholesterol: 118 mg/dL — ABNORMAL HIGH (ref 0–99)
NonHDL: 147.7
Total CHOL/HDL Ratio: 5
Triglycerides: 151 mg/dL — ABNORMAL HIGH (ref 0.0–149.0)
VLDL: 30.2 mg/dL (ref 0.0–40.0)

## 2014-10-30 LAB — BASIC METABOLIC PANEL
BUN: 6 mg/dL (ref 6–23)
CO2: 31 mEq/L (ref 19–32)
Calcium: 9.6 mg/dL (ref 8.4–10.5)
Chloride: 104 mEq/L (ref 96–112)
Creatinine, Ser: 0.64 mg/dL (ref 0.40–1.20)
GFR: 95.11 mL/min (ref >60.00)
Glucose, Bld: 97 mg/dL (ref 70–99)
Potassium: 3.3 mEq/L — ABNORMAL LOW (ref 3.5–5.1)
SODIUM: 140 meq/L (ref 135–145)

## 2014-10-30 LAB — TSH: TSH: 1.5 u[IU]/mL (ref 0.35–4.50)

## 2014-10-30 LAB — HEPATIC FUNCTION PANEL
ALBUMIN: 4.2 g/dL (ref 3.5–5.2)
ALK PHOS: 64 U/L (ref 39–117)
ALT: 10 U/L (ref 0–35)
AST: 19 U/L (ref 0–37)
BILIRUBIN DIRECT: 0.1 mg/dL (ref 0.0–0.3)
Total Bilirubin: 0.6 mg/dL (ref 0.2–1.2)
Total Protein: 6.7 g/dL (ref 6.0–8.3)

## 2014-10-30 MED ORDER — NYSTATIN 100000 UNIT/ML MT SUSP: 500000.0000 [IU] | Freq: Four times a day (QID) | OROMUCOSAL | Status: DC

## 2014-10-30 MED ORDER — OXYCODONE HCL 5 MG PO CAPS: 5.0000 mg | ORAL_CAPSULE | Freq: Four times a day (QID) | ORAL | Status: DC | PRN

## 2014-10-30 MED ORDER — OMEPRAZOLE 20 MG PO CPDR: 20.0000 mg | DELAYED_RELEASE_CAPSULE | Freq: Two times a day (BID) | ORAL | Status: DC

## 2014-10-30 NOTE — Assessment & Plan Note (Signed)
BMET Repeat Depomedrol inj

## 2014-10-30 NOTE — Progress Notes (Signed)
Pre visit review using our clinic review tool, if applicable. No additional management support is needed unless otherwise documented below in the visit note. 

## 2014-10-30 NOTE — Progress Notes (Signed)
Subjective:    HPI  C/o poor appetite, burping, sore mouth. C/o weakness  C/o burning legs at night. The L knee is better...  F/u L knee pain - better- she saw Dr Noemi Chapel last week. F/u L leg burning - better after  A DepoMedrol inj F/u rash - better after  A DepoMedrol inj. Pt saw a dermatologist  F/u LBP - chronic, worse in the past 2-3 wks while in PT - pain meds help  S/p L TKR 08/27/14 by Dr Noemi Chapel. L knee was tapped a few weeks ago  F/u nausea, LLQ abd discomfort on and on - pt was dx'd w/diverticulitis and given an abx  - better  F/u LBP - chronic - worse after surgery from moving and using a walker. Seen by Ortho - given Pednisone 1 wk ago, Zanaflex  F/u bad arthralgias due to Simvastatin - resolved off of it   The patient presents for a follow-up of  chronic hypertension, chronic dyslipidemia, A fib, tremor. She is taking Lipitor 1/2 tab qd  Her HR, BPs - OK at home  BP Readings from Last 3 Encounters:  10/30/14 150/88  10/19/14 150/90  10/16/14 142/80   Wt Readings from Last 3 Encounters:  10/30/14 141 lb (63.957 kg)  10/19/14 145 lb (65.772 kg)  10/16/14 145 lb 12 oz (66.112 kg)   Past Medical History  Diagnosis Date  . Colon polyps   . Diverticulosis of colon   . GERD (gastroesophageal reflux disease)   . Osteoporosis   . TR (tricuspid regurgitation)     Mild  . Vitamin B12 deficiency   . Vitamin D deficiency   . History of breast cancer   . Hyperlipidemia   . Familial tremor   . LBP (low back pain)   . Cataract   . Atrial fibrillation     D Taylor  . TIA (transient ischemic attack)   . Hemorrhoids   . Breast cancer 1984  . Hypertension   . Dysrhythmia     atrial fib  . Anxiety     has lorazepam on hand for nervousness, pt. reports that she had a break-in to her home on 05/2014  . History of hiatal hernia   . Neuromuscular disorder     essential tremor  . Osteoarthritis     hands & back & knees   . Primary localized osteoarthritis of  left knee   . Coronary artery disease   . AF (atrial fibrillation)   . Stroke    Past Surgical History  Procedure Laterality Date  . Breast enhancement surgery    . Cataract extraction    . Total knee arthroplasty  Dec 2011    Right - Dr Noemi Chapel  . Bilateral mastectomy    . Polypectomy    . Colonoscopy    . Vaginal hysterectomy      [facility] BSO  . Oophorectomy      BSO  . Breast surgery      Bilateral mastectomy  . Augmentation mammaplasty    . Mastectomy  1984    bilateral  . Eye surgery      cataracts removed- /w iol  & blepheroplasty  . Joint replacement Right   . Total knee arthroplasty Left 08/27/2014    dr Noemi Chapel  . Total knee arthroplasty Left 08/27/2014    Procedure: LEFT TOTAL KNEE ARTHROPLASTY;  Surgeon: Lorn Junes, MD;  Location: [facility];  Service: Orthopedics;  Laterality: Left;    reports that she  has been smoking.  She does not have any smokeless tobacco history on file. She reports that she does not drink alcohol or use illicit drugs. family history includes Breast cancer (age of onset: 20) in her mother; Colon cancer in her maternal aunt; Ovarian cancer in her maternal grandmother; Tremor in her brother and mother. There is no history of Early death or Stroke. Allergies  Allergen Reactions  . Pravastatin Anaphylaxis  . Augmentin [Amoxicillin-Pot Clavulanate]     rash  . Ciprofloxacin     GI upset   . Flagyl [Metronidazole]   . Simvastatin     REACTION: leg cramps, weakness  . Tramadol     Watery eyes   Current Outpatient Prescriptions on File Prior to Visit  Medication Sig Dispense Refill  . acetaminophen (TYLENOL) 500 MG tablet Take 500 mg by mouth as needed for mild pain.     . carvedilol (COREG) 3.125 MG tablet TAKE 1 TABLET BY MOUTH 2 TIMES DAILY. 180 tablet 0  . Cholecalciferol 1000 UNITS tablet Take 1,000 Units by mouth daily. D-3    . Cyanocobalamin (VITAMIN B-12 CR) 1000 MCG TBCR Take 1,000 mcg by mouth daily.     . diphenoxylate-atropine  (LOMOTIL) 2.5-0.025 MG per tablet Take 1 tablet by mouth 4 (four) times daily as needed for diarrhea or loose stools. 60 tablet 1  . docusate sodium (COLACE) 100 MG capsule 1 tab 2 times a day while on narcotics.  STOOL SOFTENER 60 capsule 0  . flecainide (TAMBOCOR) 50 MG tablet Take 1 and 1/2 tablets by  mouth two times daily 270 tablet 2  . furosemide (LASIX) 20 MG tablet Take 1-2 tablets (20-40 mg total) by mouth daily as needed for edema. 60 tablet 2  . LORazepam (ATIVAN) 0.5 MG tablet Take 1 tablet (0.5 mg total) by mouth 2 (two) times daily as needed for anxiety or sleep. 60 tablet 0  . omeprazole (PRILOSEC) 20 MG capsule Take 20 mg by mouth daily.      . ondansetron (ZOFRAN) 4 MG tablet Take 1 tablet (4 mg total) by mouth every 8 (eight) hours as needed for nausea or vomiting. 30 tablet 0  . polyethylene glycol (MIRALAX / GLYCOLAX) packet Take 17 g by mouth 2 (two) times daily. 60 each 0  . tizanidine (ZANAFLEX) 2 MG capsule Take 1 capsule (2 mg total) by mouth 3 (three) times daily as needed for muscle spasms. 30 capsule 0  . warfarin (COUMADIN) 3 MG tablet Take as directed by the  coumadin clinic. 180 tablet 3   No current facility-administered medications on file prior to visit.    BP 150/88 mmHg  Pulse 68  Temp(Src) 98.4 F (36.9 C) (Oral)  Wt 141 lb (63.957 kg)  SpO2 94%   Review of Systems  Constitutional: Positive for fatigue. Negative for activity change, appetite change and unexpected weight change.  HENT: Negative for congestion, mouth sores and sinus pressure.   Eyes: Negative for visual disturbance.  Respiratory: Negative for chest tightness.   Gastrointestinal: Negative for nausea and abdominal pain.  Genitourinary: Negative for frequency, difficulty urinating and vaginal pain.  Musculoskeletal: Positive for back pain and arthralgias. Negative for gait problem.  Skin: Negative for pallor.  Neurological: Positive for tremors. Negative for dizziness, weakness and  numbness.  Psychiatric/Behavioral: Negative for confusion and sleep disturbance.       Objective:   Physical Exam  Constitutional: She appears well-developed and well-nourished. No distress.  HENT:  Head: Normocephalic.  Right Ear:  External ear normal.  Left Ear: External ear normal.  Nose: Nose normal.  Mouth/Throat: Oropharynx is clear and moist.  Eyes: Conjunctivae are normal. Pupils are equal, round, and reactive to light. Right eye exhibits no discharge. Left eye exhibits no discharge.  Neck: Normal range of motion. Neck supple. No JVD present. No tracheal deviation present. No thyromegaly present.  Cardiovascular: Normal rate and normal heart sounds.   irreg irreg  Pulmonary/Chest: No stridor. No respiratory distress. She has no wheezes.  Abdominal: Soft. Bowel sounds are normal. She exhibits no distension and no mass. There is no tenderness. There is no rebound and no guarding.  Musculoskeletal: She exhibits edema. She exhibits no tenderness.  Lymphadenopathy:    She has no cervical adenopathy.  Neurological: She displays normal reflexes. No cranial nerve deficit. She exhibits normal muscle tone. Coordination normal.  Skin: No rash noted. No erythema.  Psychiatric: She has a normal mood and affect. Her behavior is normal. Judgment and thought content normal.  mild tremor LS is tender w/ROM L knee is much better; scar is ok L calf NT, swollen - trace L ankle w/trace edema  Using a walker    Lab Results  Component Value Date   WBC 3.8* 10/05/2014   HGB 11.0* 10/05/2014   HCT 34.2* 10/05/2014   PLT 333 10/05/2014   GLUCOSE 97 10/05/2014   CHOL 246* 02/07/2014   TRIG 152.0* 02/07/2014   HDL 39.90 02/07/2014   LDLDIRECT 178.0 09/24/2011   LDLCALC 176* 02/07/2014   ALT 12 10/05/2014   AST 20 10/05/2014   NA 135 10/05/2014   K 3.8 10/05/2014   CL 102 10/05/2014   CREATININE 0.69 10/05/2014   BUN 6 10/05/2014   CO2 24 10/05/2014   TSH 2.44 02/07/2014   INR 2.9  10/16/2014    [place]: no DVT     Assessment & Plan:

## 2014-10-30 NOTE — Assessment & Plan Note (Signed)
Hold BP meds - Coreg if BP is low. Hold Furosemide  BP Readings from Last 3 Encounters:  10/30/14 150/88  10/19/14 150/90  10/16/14 142/80

## 2014-10-30 NOTE — Assessment & Plan Note (Signed)
Better D/c Aleve

## 2014-10-30 NOTE — Patient Instructions (Signed)
Stop Hydrocodone, take Oxycodone for pain Prilosec 1 twice a day Small meals 4-5 times a day

## 2014-10-30 NOTE — Assessment & Plan Note (Signed)
B12 level

## 2014-10-30 NOTE — Assessment & Plan Note (Signed)
R/o candida esophagitis Nystatin po

## 2014-10-31 DIAGNOSIS — M25462 Effusion, left knee: Secondary | ICD-10-CM | POA: Diagnosis not present

## 2014-10-31 MED ORDER — METHYLPREDNISOLONE ACETATE 80 MG/ML IJ SUSP
80.0000 mg | Freq: Once | INTRAMUSCULAR | Status: AC
  Administered 2014-10-31: 80 mg via INTRAMUSCULAR

## 2014-10-31 NOTE — Addendum Note (Signed)
Addended by: Cresenciano Lick on: 10/31/2014 08:03 AM   Modules accepted: Orders

## 2014-11-01 MED ORDER — POTASSIUM CHLORIDE ER 8 MEQ PO TBCR: 8.0000 meq | EXTENDED_RELEASE_TABLET | Freq: Every day | ORAL | Status: DC

## 2014-11-05 ENCOUNTER — Encounter: Payer: Self-pay | Admitting: Internal Medicine

## 2014-11-05 DIAGNOSIS — E876 Hypokalemia: Secondary | ICD-10-CM

## 2014-11-05 NOTE — Telephone Encounter (Signed)
Patient ask if she could get another blood test to check the levels of her potassium, and could she eat food to raise her potassium level instead medication for it.please advise

## 2014-11-06 ENCOUNTER — Ambulatory Visit: Payer: Medicare Other | Admitting: General Practice

## 2014-11-06 ENCOUNTER — Other Ambulatory Visit (INDEPENDENT_AMBULATORY_CARE_PROVIDER_SITE_OTHER): Payer: Medicare Other

## 2014-11-06 ENCOUNTER — Other Ambulatory Visit: Payer: Self-pay | Admitting: General Practice

## 2014-11-06 ENCOUNTER — Ambulatory Visit (INDEPENDENT_AMBULATORY_CARE_PROVIDER_SITE_OTHER): Payer: Medicare Other | Admitting: Internal Medicine

## 2014-11-06 VITALS — BP 150/86 | HR 66 | Temp 97.6°F | Wt 138.0 lb

## 2014-11-06 DIAGNOSIS — E876 Hypokalemia: Secondary | ICD-10-CM

## 2014-11-06 DIAGNOSIS — E538 Deficiency of other specified B group vitamins: Secondary | ICD-10-CM

## 2014-11-06 DIAGNOSIS — E559 Vitamin D deficiency, unspecified: Secondary | ICD-10-CM

## 2014-11-06 DIAGNOSIS — I4891 Unspecified atrial fibrillation: Secondary | ICD-10-CM

## 2014-11-06 DIAGNOSIS — R5383 Other fatigue: Secondary | ICD-10-CM

## 2014-11-06 DIAGNOSIS — R609 Edema, unspecified: Secondary | ICD-10-CM | POA: Diagnosis not present

## 2014-11-06 DIAGNOSIS — I10 Essential (primary) hypertension: Secondary | ICD-10-CM

## 2014-11-06 LAB — PROTIME-INR: Prothrombin Time: 111.2 s (ref 9.6–13.1)

## 2014-11-06 LAB — BASIC METABOLIC PANEL
BUN: 8 mg/dL (ref 6–23)
CHLORIDE: 102 meq/L (ref 96–112)
CO2: 31 mEq/L (ref 19–32)
Calcium: 9.5 mg/dL (ref 8.4–10.5)
Creatinine, Ser: 0.68 mg/dL (ref 0.40–1.20)
GFR: 88.68 mL/min (ref >60.00)
GLUCOSE: 108 mg/dL — AB (ref 70–99)
Potassium: 3.6 mEq/L (ref 3.5–5.1)
SODIUM: 138 meq/L (ref 135–145)

## 2014-11-06 MED ORDER — CYANOCOBALAMIN 1000 MCG/ML IJ SOLN
1000.0000 ug | Freq: Once | INTRAMUSCULAR | Status: AC
  Administered 2014-11-06: 1000 ug via INTRAMUSCULAR

## 2014-11-06 MED ORDER — PHYTONADIONE 5 MG PO TABS: ORAL_TABLET | ORAL | Status: DC

## 2014-11-06 NOTE — Progress Notes (Signed)
Pre visit review using our clinic review tool, if applicable. No additional management support is needed unless otherwise documented below in the visit note. 

## 2014-11-06 NOTE — Assessment & Plan Note (Signed)
3/16 Post-op It has gotten much better after a Depomedrol IM inj

## 2014-11-06 NOTE — Assessment & Plan Note (Signed)
Pt took KCl ?nausea due to Rx

## 2014-11-06 NOTE — Progress Notes (Signed)
Agree with plan 

## 2014-11-06 NOTE — Assessment & Plan Note (Signed)
B12 sq today Re-start PO

## 2014-11-06 NOTE — Telephone Encounter (Signed)
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

## 2014-11-06 NOTE — Assessment & Plan Note (Signed)
On Coreg

## 2014-11-06 NOTE — Patient Instructions (Signed)
Stop Nystatin and Potassium

## 2014-11-06 NOTE — Assessment & Plan Note (Signed)
Re-start Vit D 

## 2014-11-06 NOTE — Assessment & Plan Note (Signed)
Better  

## 2014-11-06 NOTE — Progress Notes (Signed)
Subjective:    HPI  C/o nausea from Potassium and Nystatin. F/u poor appetite, burping, sore mouth - better. C/o weakness  C/o burning legs at night. The L knee is better...  F/u L knee pain - better- she saw Dr Noemi Chapel last week. F/u L leg burning - better after  A DepoMedrol inj F/u rash - better after  A DepoMedrol inj. Pt saw a dermatologist  F/u LBP - chronic, worse in the past 2-3 wks while in PT - Oxycodone  Helps a lot  S/p L TKR 08/27/14 by Dr Noemi Chapel. L knee was tapped a few weeks ago  F/u nausea, LLQ abd discomfort on and on - pt was dx'd w/diverticulitis and given an abx  - better  F/u LBP - chronic - worse after surgery from moving and using a walker. Seen by Ortho - given Pednisone 1 wk ago, Zanaflex  F/u bad arthralgias due to Simvastatin - resolved off of it   The patient presents for a follow-up of  chronic hypertension, chronic dyslipidemia, A fib, tremor. She is taking Lipitor 1/2 tab qd  Her HR, BPs - OK at home  BP 150/86 mmHg  Pulse 66  Temp(Src) 97.6 F (36.4 C) (Oral)  Wt 138 lb (62.596 kg)  SpO2 97%   BP Readings from Last 3 Encounters:  11/06/14 150/86  10/30/14 150/88  10/19/14 150/90   Wt Readings from Last 3 Encounters:  11/06/14 138 lb (62.596 kg)  10/30/14 141 lb (63.957 kg)  10/19/14 145 lb (65.772 kg)   Past Medical History  Diagnosis Date  . Colon polyps   . Diverticulosis of colon   . GERD (gastroesophageal reflux disease)   . Osteoporosis   . TR (tricuspid regurgitation)     Mild  . Vitamin B12 deficiency   . Vitamin D deficiency   . History of breast cancer   . Hyperlipidemia   . Familial tremor   . LBP (low back pain)   . Cataract   . Atrial fibrillation     D [place]  . TIA (transient ischemic attack)   . Hemorrhoids   . Breast cancer 1984  . Hypertension   . Dysrhythmia     atrial fib  . Anxiety     has lorazepam on hand for nervousness, pt. reports that she had a break-in to her home on 05/2014  . History  of hiatal hernia   . Neuromuscular disorder     essential tremor  . Osteoarthritis     hands & back & knees   . Primary localized osteoarthritis of left knee   . Coronary artery disease   . AF (atrial fibrillation)   . Stroke    Past Surgical History  Procedure Laterality Date  . Breast enhancement surgery    . Cataract extraction    . Total knee arthroplasty  Dec 2011    Right - Dr Noemi Chapel  . Bilateral mastectomy    . Polypectomy    . Colonoscopy    . Vaginal hysterectomy      [facility] BSO  . Oophorectomy      BSO  . Breast surgery      Bilateral mastectomy  . Augmentation mammaplasty    . Mastectomy  1984    bilateral  . Eye surgery      cataracts removed- /w iol  & blepheroplasty  . Joint replacement Right   . Total knee arthroplasty Left 08/27/2014    dr Noemi Chapel  . Total knee arthroplasty  Left 08/27/2014    Procedure: LEFT TOTAL KNEE ARTHROPLASTY;  Surgeon: Lorn Junes, MD;  Location: [facility];  Service: Orthopedics;  Laterality: Left;    reports that she has been smoking.  She does not have any smokeless tobacco history on file. She reports that she does not drink alcohol or use illicit drugs. family history includes Breast cancer (age of onset: 39) in her mother; Colon cancer in her maternal aunt; Ovarian cancer in her maternal grandmother; Tremor in her brother and mother. There is no history of Early death or Stroke. Allergies  Allergen Reactions  . Pravastatin Anaphylaxis  . Augmentin [Amoxicillin-Pot Clavulanate]     rash  . Ciprofloxacin     GI upset   . Flagyl [Metronidazole]   . Simvastatin     REACTION: leg cramps, weakness  . Tramadol     Watery eyes   Current Outpatient Prescriptions on File Prior to Visit  Medication Sig Dispense Refill  . acetaminophen (TYLENOL) 500 MG tablet Take 500 mg by mouth as needed for mild pain.     . carvedilol (COREG) 3.125 MG tablet TAKE 1 TABLET BY MOUTH 2 TIMES DAILY. 180 tablet 0  . Cholecalciferol 1000 UNITS tablet  Take 1,000 Units by mouth daily. D-3    . Cyanocobalamin (VITAMIN B-12 CR) 1000 MCG TBCR Take 1,000 mcg by mouth daily.     . diphenoxylate-atropine (LOMOTIL) 2.5-0.025 MG per tablet Take 1 tablet by mouth 4 (four) times daily as needed for diarrhea or loose stools. 60 tablet 1  . docusate sodium (COLACE) 100 MG capsule 1 tab 2 times a day while on narcotics.  STOOL SOFTENER 60 capsule 0  . flecainide (TAMBOCOR) 50 MG tablet Take 1 and 1/2 tablets by  mouth two times daily 270 tablet 2  . furosemide (LASIX) 20 MG tablet Take 1-2 tablets (20-40 mg total) by mouth daily as needed for edema. 60 tablet 2  . LORazepam (ATIVAN) 0.5 MG tablet Take 1 tablet (0.5 mg total) by mouth 2 (two) times daily as needed for anxiety or sleep. 60 tablet 0  . nystatin (MYCOSTATIN) 100000 UNIT/ML suspension Take 5 mLs (500,000 Units total) by mouth 4 (four) times daily. Swish, hold, swallow x 10 days 240 mL 0  . omeprazole (PRILOSEC) 20 MG capsule Take 1 capsule (20 mg total) by mouth 2 (two) times daily before a meal. 60 capsule 3  . ondansetron (ZOFRAN) 4 MG tablet Take 1 tablet (4 mg total) by mouth every 8 (eight) hours as needed for nausea or vomiting. 30 tablet 0  . oxycodone (OXY-IR) 5 MG capsule Take 1 capsule (5 mg total) by mouth every 6 (six) hours as needed for pain. 100 capsule 0  . polyethylene glycol (MIRALAX / GLYCOLAX) packet Take 17 g by mouth 2 (two) times daily. 60 each 0  . potassium chloride (KLOR-CON) 8 MEQ tablet Take 1 tablet (8 mEq total) by mouth daily. 30 tablet 1  . triamcinolone ointment (KENALOG) 0.1 % Apply 1 application topically 2 (two) times daily.    [name] warfarin (COUMADIN) 3 MG tablet Take as directed by the  coumadin clinic. 180 tablet 3   No current facility-administered medications on file prior to visit.    BP 150/86 mmHg  Pulse 66  Temp(Src) 97.6 F (36.4 C) (Oral)  Wt 138 lb (62.596 kg)  SpO2 97%   Review of Systems  Constitutional: Positive for fatigue. Negative for  activity change, appetite change and unexpected weight change.  HENT:  Negative for congestion, mouth sores and sinus pressure.   Eyes: Negative for visual disturbance.  Respiratory: Negative for chest tightness.   Gastrointestinal: Negative for nausea and abdominal pain.  Genitourinary: Negative for frequency, difficulty urinating and vaginal pain.  Musculoskeletal: Positive for back pain and arthralgias. Negative for gait problem.  Skin: Negative for pallor.  Neurological: Positive for tremors. Negative for dizziness, weakness and numbness.  Psychiatric/Behavioral: Negative for confusion and sleep disturbance.       Objective:   Physical Exam  Constitutional: She appears well-developed and well-nourished. No distress.  HENT:  Head: Normocephalic.  Right Ear: External ear normal.  Left Ear: External ear normal.  Nose: Nose normal.  Mouth/Throat: Oropharynx is clear and moist.  Eyes: Conjunctivae are normal. Pupils are equal, round, and reactive to light. Right eye exhibits no discharge. Left eye exhibits no discharge.  Neck: Normal range of motion. Neck supple. No JVD present. No tracheal deviation present. No thyromegaly present.  Cardiovascular: Normal rate and normal heart sounds.   irreg irreg  Pulmonary/Chest: No stridor. No respiratory distress. She has no wheezes.  Abdominal: Soft. Bowel sounds are normal. She exhibits no distension and no mass. There is no tenderness. There is no rebound and no guarding.  Musculoskeletal: She exhibits edema. She exhibits no tenderness.  Lymphadenopathy:    She has no cervical adenopathy.  Neurological: She displays normal reflexes. No cranial nerve deficit. She exhibits normal muscle tone. Coordination normal.  Skin: No rash noted. No erythema.  Psychiatric: She has a normal mood and affect. Her behavior is normal. Judgment and thought content normal.  mild tremor LS is tender w/ROM L knee is much better; scar is ok L calf NT, swollen -  trace L ankle w/trace edema  Using a walker    Lab Results  Component Value Date   WBC 4.6 10/30/2014   HGB 12.1 10/30/2014   HCT 36.7 10/30/2014   PLT 300.0 10/30/2014   GLUCOSE 97 10/30/2014   CHOL 189 10/30/2014   TRIG 151.0* 10/30/2014   HDL 41.30 10/30/2014   LDLDIRECT 178.0 09/24/2011   LDLCALC 118* 10/30/2014   ALT 10 10/30/2014   AST 19 10/30/2014   NA 140 10/30/2014   K 3.3* 10/30/2014   CL 104 10/30/2014   CREATININE 0.64 10/30/2014   BUN 6 10/30/2014   CO2 31 10/30/2014   TSH 1.50 10/30/2014   INR 2.9 10/16/2014    [place]: no DVT     Assessment & Plan:

## 2014-11-07 ENCOUNTER — Ambulatory Visit: Payer: Self-pay | Admitting: Internal Medicine

## 2014-11-08 ENCOUNTER — Ambulatory Visit (INDEPENDENT_AMBULATORY_CARE_PROVIDER_SITE_OTHER): Payer: Medicare Other | Admitting: General Practice

## 2014-11-08 DIAGNOSIS — I4891 Unspecified atrial fibrillation: Secondary | ICD-10-CM

## 2014-11-08 LAB — POCT INR: INR: 1.2

## 2014-11-08 NOTE — Progress Notes (Signed)
Pre visit review using our clinic review tool, if applicable. No additional management support is needed unless otherwise documented below in the visit note. 

## 2014-11-13 ENCOUNTER — Ambulatory Visit (INDEPENDENT_AMBULATORY_CARE_PROVIDER_SITE_OTHER): Payer: Medicare Other | Admitting: Internal Medicine

## 2014-11-13 ENCOUNTER — Encounter: Payer: Self-pay | Admitting: Internal Medicine

## 2014-11-13 DIAGNOSIS — M25462 Effusion, left knee: Secondary | ICD-10-CM

## 2014-11-13 DIAGNOSIS — R1032 Left lower quadrant pain: Secondary | ICD-10-CM | POA: Diagnosis not present

## 2014-11-13 DIAGNOSIS — I10 Essential (primary) hypertension: Secondary | ICD-10-CM | POA: Diagnosis not present

## 2014-11-13 DIAGNOSIS — M5440 Lumbago with sciatica, unspecified side: Secondary | ICD-10-CM

## 2014-11-13 MED ORDER — TRAMADOL HCL 50 MG PO TABS: 50.0000 mg | ORAL_TABLET | Freq: Two times a day (BID) | ORAL | Status: DC | PRN

## 2014-11-13 NOTE — Assessment & Plan Note (Signed)
Dr Louanne Skye appt soon Tramadol, Tylenol prn

## 2014-11-13 NOTE — Assessment & Plan Note (Addendum)
Call if not better/resolved BM makes pain disappear Call if worse, if fever

## 2014-11-13 NOTE — Assessment & Plan Note (Signed)
Better  

## 2014-11-13 NOTE — Progress Notes (Signed)
Subjective:    HPI  F/u nausea from Potassium and Nystatin - better after she stopped Rx. F/u poor appetite, burping, sore mouth - better; weakness - better  F/u burning legs at night. The L knee is better...  F/u L knee pain - better. F/u L leg burning - better after  A DepoMedrol inj F/u rash - resolved after  A DepoMedrol inj. Pt saw a dermatologist  F/u LBP - chronic, worse in the past 2-3 wks while in PT - Oxycodone  Helps a lot  S/p L TKR 08/27/14 by Dr Noemi Chapel. L knee was tapped a few weeks ago  F/u nausea, LLQ abd discomfort on and on - pt was dx'd w/diverticulitis and given an abx  - it was better. She had more pain in the past 2 days  F/u LBP - chronic - worse after surgery from moving and using a walker. Seen by Ortho - given Pednisone 1 wk ago, Zanaflex  F/u bad arthralgias due to Simvastatin - resolved off of it   The patient presents for a follow-up of  chronic hypertension, chronic dyslipidemia, A fib, tremor. She is taking Lipitor 1/2 tab qd  Her HR, BPs - OK at home  There were no vitals taken for this visit.   BP Readings from Last 3 Encounters:  11/06/14 150/86  10/30/14 150/88  10/19/14 150/90   Wt Readings from Last 3 Encounters:  11/06/14 138 lb (62.596 kg)  10/30/14 141 lb (63.957 kg)  10/19/14 145 lb (65.772 kg)   Past Medical History  Diagnosis Date  . Colon polyps   . Diverticulosis of colon   . GERD (gastroesophageal reflux disease)   . Osteoporosis   . TR (tricuspid regurgitation)     Mild  . Vitamin B12 deficiency   . Vitamin D deficiency   . History of breast cancer   . Hyperlipidemia   . Familial tremor   . LBP (low back pain)   . Cataract   . Atrial fibrillation     D Taylor  . TIA (transient ischemic attack)   . Hemorrhoids   . Breast cancer 1984  . Hypertension   . Dysrhythmia     atrial fib  . Anxiety     has lorazepam on hand for nervousness, pt. reports that she had a break-in to her home on 05/2014  . History of  hiatal hernia   . Neuromuscular disorder     essential tremor  . Osteoarthritis     hands & back & knees   . Primary localized osteoarthritis of left knee   . Coronary artery disease   . AF (atrial fibrillation)   . Stroke    Past Surgical History  Procedure Laterality Date  . Breast enhancement surgery    . Cataract extraction    . Total knee arthroplasty  Dec 2011    Right - Dr Noemi Chapel  . Bilateral mastectomy    . Polypectomy    . Colonoscopy    . Vaginal hysterectomy      [facility] BSO  . Oophorectomy      BSO  . Breast surgery      Bilateral mastectomy  . Augmentation mammaplasty    . Mastectomy  1984    bilateral  . Eye surgery      cataracts removed- /w iol  & blepheroplasty  . Joint replacement Right   . Total knee arthroplasty Left 08/27/2014    dr Noemi Chapel  . Total knee arthroplasty Left 08/27/2014  Procedure: LEFT TOTAL KNEE ARTHROPLASTY;  Surgeon: Lorn Junes, MD;  Location: [facility];  Service: Orthopedics;  Laterality: Left;    reports that she has been smoking.  She does not have any smokeless tobacco history on file. She reports that she does not drink alcohol or use illicit drugs. family history includes Breast cancer (age of onset: 73) in her mother; Colon cancer in her maternal aunt; Ovarian cancer in her maternal grandmother; Tremor in her brother and mother. There is no history of Early death or Stroke. Allergies  Allergen Reactions  . Pravastatin Anaphylaxis  . Augmentin [Amoxicillin-Pot Clavulanate]     rash  . Ciprofloxacin Nausea Only    GI upset   . Flagyl [Metronidazole]   . Simvastatin     REACTION: leg cramps, weakness  . Tramadol     Watery eyes   Current Outpatient Prescriptions on File Prior to Visit  Medication Sig Dispense Refill  . carvedilol (COREG) 3.125 MG tablet TAKE 1 TABLET BY MOUTH 2 TIMES DAILY. 180 tablet 0  . Cholecalciferol 1000 UNITS tablet Take 1,000 Units by mouth daily. D-3    . Cyanocobalamin (VITAMIN B-12 CR) 1000 MCG  TBCR Take 1,000 mcg by mouth daily.     . diphenoxylate-atropine (LOMOTIL) 2.5-0.025 MG per tablet Take 1 tablet by mouth 4 (four) times daily as needed for diarrhea or loose stools. 60 tablet 1  . docusate sodium (COLACE) 100 MG capsule 1 tab 2 times a day while on narcotics.  STOOL SOFTENER 60 capsule 0  . flecainide (TAMBOCOR) 50 MG tablet Take 1 and 1/2 tablets by  mouth two times daily 270 tablet 2  . furosemide (LASIX) 20 MG tablet Take 1-2 tablets (20-40 mg total) by mouth daily as needed for edema. 60 tablet 2  . LORazepam (ATIVAN) 0.5 MG tablet Take 1 tablet (0.5 mg total) by mouth 2 (two) times daily as needed for anxiety or sleep. 60 tablet 0  . omeprazole (PRILOSEC) 20 MG capsule Take 1 capsule (20 mg total) by mouth 2 (two) times daily before a meal. 60 capsule 3  . ondansetron (ZOFRAN) 4 MG tablet Take 1 tablet (4 mg total) by mouth every 8 (eight) hours as needed for nausea or vomiting. 30 tablet 0  . oxycodone (OXY-IR) 5 MG capsule Take 1 capsule (5 mg total) by mouth every 6 (six) hours as needed for pain. 100 capsule 0  . phytonadione (MEPHYTON) 5 MG tablet Take 2.5 mg (1/2 tablet) NOW! 1 tablet 0  . polyethylene glycol (MIRALAX / GLYCOLAX) packet Take 17 g by mouth 2 (two) times daily. 60 each 0  . triamcinolone ointment (KENALOG) 0.1 % Apply 1 application topically 2 (two) times daily.    [name] warfarin (COUMADIN) 3 MG tablet Take as directed by the  coumadin clinic. 180 tablet 3  . acetaminophen (TYLENOL) 500 MG tablet Take 500 mg by mouth as needed for mild pain.      No current facility-administered medications on file prior to visit.    There were no vitals taken for this visit.   Review of Systems  Constitutional: Positive for fatigue. Negative for activity change, appetite change and unexpected weight change.  HENT: Negative for congestion, mouth sores and sinus pressure.   Eyes: Negative for visual disturbance.  Respiratory: Negative for chest tightness.    Gastrointestinal: Negative for nausea and abdominal pain.  Genitourinary: Negative for frequency, difficulty urinating and vaginal pain.  Musculoskeletal: Positive for back pain and arthralgias. Negative for  gait problem.  Skin: Negative for pallor.  Neurological: Positive for tremors. Negative for dizziness, weakness and numbness.  Psychiatric/Behavioral: Negative for confusion and sleep disturbance.       Objective:   Physical Exam  Constitutional: She appears well-developed and well-nourished. No distress.  HENT:  Head: Normocephalic.  Right Ear: External ear normal.  Left Ear: External ear normal.  Nose: Nose normal.  Mouth/Throat: Oropharynx is clear and moist.  Eyes: Conjunctivae are normal. Pupils are equal, round, and reactive to light. Right eye exhibits no discharge. Left eye exhibits no discharge.  Neck: Normal range of motion. Neck supple. No JVD present. No tracheal deviation present. No thyromegaly present.  Cardiovascular: Normal rate and normal heart sounds.   irreg irreg  Pulmonary/Chest: No stridor. No respiratory distress. She has no wheezes.  Abdominal: Soft. Bowel sounds are normal. She exhibits no distension and no mass. There is no tenderness. There is no rebound and no guarding.  Musculoskeletal: She exhibits edema. She exhibits no tenderness.  Lymphadenopathy:    She has no cervical adenopathy.  Neurological: She displays normal reflexes. No cranial nerve deficit. She exhibits normal muscle tone. Coordination normal.  Skin: No rash noted. No erythema.  Psychiatric: She has a normal mood and affect. Her behavior is normal. Judgment and thought content normal.  mild tremor LS is tender w/ROM L knee is much better; scar is ok L calf NT, swollen - trace L ankle w/trace edema LLQ is sensitive to palp - no mass; no rebound sx's Using a cane    Lab Results  Component Value Date   WBC 4.6 10/30/2014   HGB 12.1 10/30/2014   HCT 36.7 10/30/2014   PLT  300.0 10/30/2014   GLUCOSE 108* 11/06/2014   CHOL 189 10/30/2014   TRIG 151.0* 10/30/2014   HDL 41.30 10/30/2014   LDLDIRECT 178.0 09/24/2011   LDLCALC 118* 10/30/2014   ALT 10 10/30/2014   AST 19 10/30/2014   NA 138 11/06/2014   K 3.6 11/06/2014   CL 102 11/06/2014   CREATININE 0.68 11/06/2014   BUN 8 11/06/2014   CO2 31 11/06/2014   TSH 1.50 10/30/2014   INR 1.2 11/08/2014    [place]: no DVT     Assessment & Plan:

## 2014-11-13 NOTE — Progress Notes (Signed)
Pre visit review using our clinic review tool, if applicable. No additional management support is needed unless otherwise documented below in the visit note. 

## 2014-11-13 NOTE — Assessment & Plan Note (Signed)
Continue with current prescription therapy as reflected on the Med list.  

## 2014-11-13 NOTE — Patient Instructions (Addendum)
Oxycodone 5 mg:  Take 1/2 tab 4 times a day for 1 week, then 1/2 tab 3 times a day for 1 week, then 1/2 tab 2 times a day for 1 week, then 1/2 tab 1 time a day for 1 week, then stop.  Try Senakot or Senna for a laxative

## 2014-11-14 ENCOUNTER — Telehealth: Payer: Self-pay | Admitting: Internal Medicine

## 2014-11-14 NOTE — Telephone Encounter (Signed)
Pt called in and needs clarification on med instructions, Her pain meds   Best number [phone]

## 2014-11-15 NOTE — Telephone Encounter (Signed)
Pt informed of below   Patient Instructions     Oxycodone 5 mg:  Take 1/2 tab 4 times a day for 1 week, then 1/2 tab 3 times a day for 1 week, then 1/2 tab 2 times a day for 1 week, then 1/2 tab 1 time a day for 1 week, then stop.  Try Senakot or Senna for a laxative

## 2014-11-19 ENCOUNTER — Ambulatory Visit (INDEPENDENT_AMBULATORY_CARE_PROVIDER_SITE_OTHER): Payer: Medicare Other | Admitting: General Practice

## 2014-11-19 DIAGNOSIS — I4891 Unspecified atrial fibrillation: Secondary | ICD-10-CM

## 2014-11-19 LAB — POCT INR: INR: 3.4

## 2014-11-19 NOTE — Progress Notes (Signed)
Pre visit review using our clinic review tool, if applicable. No additional management support is needed unless otherwise documented below in the visit note. 

## 2014-11-20 ENCOUNTER — Ambulatory Visit: Payer: Self-pay | Admitting: Internal Medicine

## 2014-11-30 ENCOUNTER — Ambulatory Visit: Payer: Self-pay | Admitting: Internal Medicine

## 2014-12-03 ENCOUNTER — Ambulatory Visit (INDEPENDENT_AMBULATORY_CARE_PROVIDER_SITE_OTHER): Payer: Medicare Other | Admitting: General Practice

## 2014-12-03 DIAGNOSIS — I4891 Unspecified atrial fibrillation: Secondary | ICD-10-CM

## 2014-12-03 LAB — POCT INR: INR: 2.3

## 2014-12-03 NOTE — Progress Notes (Signed)
Pre visit review using our clinic review tool, if applicable. No additional management support is needed unless otherwise documented below in the visit note. 

## 2014-12-04 ENCOUNTER — Telehealth: Payer: Self-pay | Admitting: Internal Medicine

## 2014-12-04 ENCOUNTER — Ambulatory Visit: Payer: Self-pay | Admitting: Internal Medicine

## 2014-12-04 DIAGNOSIS — R899 Unspecified abnormal finding in specimens from other organs, systems and tissues: Secondary | ICD-10-CM

## 2014-12-04 NOTE — Telephone Encounter (Signed)
Advised pt that labs are entered, patient is to go labs on her next visit

## 2014-12-04 NOTE — Telephone Encounter (Signed)
Patient is stating that she wants to make sure that she does not need to do labs for upcoming appt. There are no orders placed, but patient states that several levels have been low. She requests that you call her to verify

## 2014-12-04 NOTE — Telephone Encounter (Signed)
OK BMET, CBC Thx

## 2014-12-04 NOTE — Telephone Encounter (Signed)
Please advise if you want lab orders entered, thanks

## 2014-12-05 ENCOUNTER — Other Ambulatory Visit (INDEPENDENT_AMBULATORY_CARE_PROVIDER_SITE_OTHER): Payer: Medicare Other

## 2014-12-05 DIAGNOSIS — R110 Nausea: Secondary | ICD-10-CM | POA: Diagnosis not present

## 2014-12-05 DIAGNOSIS — I10 Essential (primary) hypertension: Secondary | ICD-10-CM | POA: Diagnosis not present

## 2014-12-05 DIAGNOSIS — R899 Unspecified abnormal finding in specimens from other organs, systems and tissues: Secondary | ICD-10-CM | POA: Diagnosis not present

## 2014-12-05 DIAGNOSIS — M25462 Effusion, left knee: Secondary | ICD-10-CM | POA: Diagnosis not present

## 2014-12-05 DIAGNOSIS — E538 Deficiency of other specified B group vitamins: Secondary | ICD-10-CM | POA: Diagnosis not present

## 2014-12-05 LAB — BASIC METABOLIC PANEL
BUN: 10 mg/dL (ref 6–23)
CHLORIDE: 104 meq/L (ref 96–112)
CO2: 30 mEq/L (ref 19–32)
CREATININE: 0.65 mg/dL (ref 0.40–1.20)
Calcium: 9.5 mg/dL (ref 8.4–10.5)
GFR: 93.4 mL/min (ref >60.00)
GLUCOSE: 82 mg/dL (ref 70–99)
Potassium: 3.7 mEq/L (ref 3.5–5.1)
Sodium: 139 mEq/L (ref 135–145)

## 2014-12-05 LAB — CBC WITH DIFFERENTIAL/PLATELET
BASOS ABS: 0 10*3/uL (ref 0.0–0.1)
Basophils Relative: 1.1 % (ref 0.0–3.0)
EOS ABS: 0.1 10*3/uL (ref 0.0–0.7)
Eosinophils Relative: 1.9 % (ref 0.0–5.0)
HCT: 38.6 % (ref 36.0–46.0)
Hemoglobin: 12.7 g/dL (ref 12.0–15.0)
LYMPHS PCT: 43.8 % (ref 12.0–46.0)
Lymphs Abs: 1.4 10*3/uL (ref 0.7–4.0)
MCHC: 32.8 g/dL (ref 30.0–36.0)
MCV: 79.1 fl (ref 78.0–100.0)
MONO ABS: 0.3 10*3/uL (ref 0.1–1.0)
Monocytes Relative: 10.7 % (ref 3.0–12.0)
NEUTROS PCT: 42.5 % — AB (ref 43.0–77.0)
Neutro Abs: 1.3 10*3/uL — ABNORMAL LOW (ref 1.4–7.7)
PLATELETS: 279 10*3/uL (ref 150.0–400.0)
RBC: 4.88 Mil/uL (ref 3.87–5.11)
RDW: 16.7 % — ABNORMAL HIGH (ref 11.5–15.5)
WBC: 3.1 10*3/uL — ABNORMAL LOW (ref 4.0–10.5)

## 2014-12-05 LAB — VITAMIN B12: VITAMIN B 12: 627 pg/mL (ref 211–911)

## 2014-12-05 LAB — HEPATIC FUNCTION PANEL
ALK PHOS: 60 U/L (ref 39–117)
ALT: 11 U/L (ref 0–35)
AST: 18 U/L (ref 0–37)
Albumin: 4 g/dL (ref 3.5–5.2)
Bilirubin, Direct: 0.1 mg/dL (ref 0.0–0.3)
Total Bilirubin: 0.5 mg/dL (ref 0.2–1.2)
Total Protein: 6.7 g/dL (ref 6.0–8.3)

## 2014-12-05 LAB — LIPASE: Lipase: 57 U/L (ref 11.0–59.0)

## 2014-12-06 ENCOUNTER — Ambulatory Visit (INDEPENDENT_AMBULATORY_CARE_PROVIDER_SITE_OTHER): Payer: Medicare Other | Admitting: Internal Medicine

## 2014-12-06 ENCOUNTER — Encounter: Payer: Self-pay | Admitting: Internal Medicine

## 2014-12-06 VITALS — BP 118/88 | HR 62 | Wt 129.0 lb

## 2014-12-06 DIAGNOSIS — M5440 Lumbago with sciatica, unspecified side: Secondary | ICD-10-CM

## 2014-12-06 DIAGNOSIS — I10 Essential (primary) hypertension: Secondary | ICD-10-CM

## 2014-12-06 DIAGNOSIS — F411 Generalized anxiety disorder: Secondary | ICD-10-CM

## 2014-12-06 DIAGNOSIS — E538 Deficiency of other specified B group vitamins: Secondary | ICD-10-CM

## 2014-12-06 DIAGNOSIS — G250 Essential tremor: Secondary | ICD-10-CM | POA: Diagnosis not present

## 2014-12-06 MED ORDER — CYANOCOBALAMIN 1000 MCG/ML IJ SOLN
1000.0000 ug | Freq: Once | INTRAMUSCULAR | Status: AC
  Administered 2014-12-06: 1000 ug via INTRAMUSCULAR

## 2014-12-06 MED ORDER — MIRTAZAPINE 15 MG PO TABS: ORAL_TABLET | ORAL | Status: DC

## 2014-12-06 MED ORDER — GABAPENTIN 100 MG PO CAPS: 100.0000 mg | ORAL_CAPSULE | Freq: Three times a day (TID) | ORAL | Status: DC

## 2014-12-06 NOTE — Assessment & Plan Note (Signed)
Worse Dr Louanne Skye is planning epidural Gabapentin 4/16 - ok to increase to tid

## 2014-12-06 NOTE — Assessment & Plan Note (Signed)
Chronic  On Coreg

## 2014-12-06 NOTE — Progress Notes (Signed)
Pre visit review using our clinic review tool, if applicable. No additional management support is needed unless otherwise documented below in the visit note. 

## 2014-12-06 NOTE — Assessment & Plan Note (Signed)
Chronic IM B12 q 1 mo

## 2014-12-06 NOTE — Progress Notes (Signed)
Subjective:    HPI  F/u nausea from Potassium and Nystatin - better after she stopped Rx. F/u poor appetite, burping, sore mouth - better; weakness - better  F/u burning legs at night. The L knee is better...  F/u L knee pain - better. F/u L leg burning F/u rash - resolved after  A DepoMedrol inj. Pt saw a dermatologist  C/o LBP - chronic, worse in the past 6 wks while in PT. Pt re-started PT last week- Oxycodone helps. Dr Louanne Skye said - OA. F/u LBP is worse after surgery from moving and using a walker. Seen by Ortho -Dr Louanne Skye  S/p L TKR 08/27/14 by Dr Noemi Chapel. L knee was tapped a few weeks ago  F/u nausea, LLQ abd discomfort on and on - pt was dx'd w/diverticulitis and given an abx  - it was better. She had more pain in the past 2 days  F/u LBP - chronic - worse after surgery from moving and using a walker. Seen by Ortho -Dr Louanne Skye  Pt had bad arthralgias due to Simvastatin - resolved off of it. The patient presents for a follow-up of  chronic hypertension, chronic dyslipidemia, A fib, tremor. She is taking Lipitor 1/2 tab qd  Her HR, BPs - OK at home  BP 118/88 mmHg  Pulse 62  Wt 129 lb (58.514 kg)  SpO2 97%   BP Readings from Last 3 Encounters:  12/06/14 118/88  11/06/14 150/86  10/30/14 150/88   Wt Readings from Last 3 Encounters:  12/06/14 129 lb (58.514 kg)  11/06/14 138 lb (62.596 kg)  10/30/14 141 lb (63.957 kg)   Past Medical History  Diagnosis Date  . Colon polyps   . Diverticulosis of colon   . GERD (gastroesophageal reflux disease)   . Osteoporosis   . TR (tricuspid regurgitation)     Mild  . Vitamin B12 deficiency   . Vitamin D deficiency   . History of breast cancer   . Hyperlipidemia   . Familial tremor   . LBP (low back pain)   . Cataract   . Atrial fibrillation     D [place]  . TIA (transient ischemic attack)   . Hemorrhoids   . Breast cancer 1984  . Hypertension   . Dysrhythmia     atrial fib  . Anxiety     has lorazepam on hand for  nervousness, pt. reports that she had a break-in to her home on 05/2014  . History of hiatal hernia   . Neuromuscular disorder     essential tremor  . Osteoarthritis     hands & back & knees   . Primary localized osteoarthritis of left knee   . Coronary artery disease   . AF (atrial fibrillation)   . Stroke    Past Surgical History  Procedure Laterality Date  . Breast enhancement surgery    . Cataract extraction    . Total knee arthroplasty  Dec 2011    Right - Dr Noemi Chapel  . Bilateral mastectomy    . Polypectomy    . Colonoscopy    . Vaginal hysterectomy      [facility] BSO  . Oophorectomy      BSO  . Breast surgery      Bilateral mastectomy  . Augmentation mammaplasty    . Mastectomy  1984    bilateral  . Eye surgery      cataracts removed- /w iol  & blepheroplasty  . Joint replacement Right   .  Total knee arthroplasty Left 08/27/2014    dr Noemi Chapel  . Total knee arthroplasty Left 08/27/2014    Procedure: LEFT TOTAL KNEE ARTHROPLASTY;  Surgeon: Lorn Junes, MD;  Location: [facility];  Service: Orthopedics;  Laterality: Left;    reports that she has been smoking.  She does not have any smokeless tobacco history on file. She reports that she does not drink alcohol or use illicit drugs. family history includes Breast cancer (age of onset: 110) in her mother; Colon cancer in her maternal aunt; Ovarian cancer in her maternal grandmother; Tremor in her brother and mother. There is no history of Early death or Stroke. Allergies  Allergen Reactions  . Pravastatin Anaphylaxis  . Augmentin [Amoxicillin-Pot Clavulanate]     rash  . Ciprofloxacin Nausea Only    GI upset   . Flagyl [Metronidazole]   . Simvastatin     REACTION: leg cramps, weakness  . Tramadol     Watery eyes   Current Outpatient Prescriptions on File Prior to Visit  Medication Sig Dispense Refill  . carvedilol (COREG) 3.125 MG tablet TAKE 1 TABLET BY MOUTH 2 TIMES DAILY. 180 tablet 0  . Cholecalciferol 1000 UNITS  tablet Take 1,000 Units by mouth daily. D-3    . Cyanocobalamin (VITAMIN B-12 CR) 1000 MCG TBCR Take 1,000 mcg by mouth daily.     . diphenoxylate-atropine (LOMOTIL) 2.5-0.025 MG per tablet Take 1 tablet by mouth 4 (four) times daily as needed for diarrhea or loose stools. 60 tablet 1  . docusate sodium (COLACE) 100 MG capsule 1 tab 2 times a day while on narcotics.  STOOL SOFTENER 60 capsule 0  . flecainide (TAMBOCOR) 50 MG tablet Take 1 and 1/2 tablets by  mouth two times daily 270 tablet 2  . furosemide (LASIX) 20 MG tablet Take 1-2 tablets (20-40 mg total) by mouth daily as needed for edema. 60 tablet 2  . LORazepam (ATIVAN) 0.5 MG tablet Take 1 tablet (0.5 mg total) by mouth 2 (two) times daily as needed for anxiety or sleep. 60 tablet 0  . omeprazole (PRILOSEC) 20 MG capsule Take 1 capsule (20 mg total) by mouth 2 (two) times daily before a meal. 60 capsule 3  . ondansetron (ZOFRAN) 4 MG tablet Take 1 tablet (4 mg total) by mouth every 8 (eight) hours as needed for nausea or vomiting. 30 tablet 0  . polyethylene glycol (MIRALAX / GLYCOLAX) packet Take 17 g by mouth 2 (two) times daily. (Patient taking differently: Take 17 g by mouth daily as needed. ) 60 each 0  . triamcinolone ointment (KENALOG) 0.1 % Apply 1 application topically 2 (two) times daily.    [name] warfarin (COUMADIN) 3 MG tablet Take as directed by the  coumadin clinic. 180 tablet 3  . acetaminophen (TYLENOL) 500 MG tablet Take 500 mg by mouth as needed for mild pain.     . phytonadione (MEPHYTON) 5 MG tablet Take 2.5 mg (1/2 tablet) NOW! (Patient not taking: Reported on 12/06/2014) 1 tablet 0  . traMADol (ULTRAM) 50 MG tablet Take 1-2 tablets (50-100 mg total) by mouth 2 (two) times daily as needed. (Patient not taking: Reported on 12/06/2014) 100 tablet 3   No current facility-administered medications on file prior to visit.    BP 118/88 mmHg  Pulse 62  Wt 129 lb (58.514 kg)  SpO2 97%   Review of Systems  Constitutional:  Positive for fatigue. Negative for activity change, appetite change and unexpected weight change.  HENT: Negative  for congestion, mouth sores and sinus pressure.   Eyes: Negative for visual disturbance.  Respiratory: Negative for chest tightness.   Gastrointestinal: Negative for nausea and abdominal pain.  Genitourinary: Negative for frequency, difficulty urinating and vaginal pain.  Musculoskeletal: Positive for back pain and arthralgias. Negative for gait problem.  Skin: Negative for pallor.  Neurological: Positive for tremors. Negative for dizziness, weakness and numbness.  Psychiatric/Behavioral: Negative for confusion and sleep disturbance.       Objective:   Physical Exam  Constitutional: She appears well-developed and well-nourished. No distress.  HENT:  Head: Normocephalic.  Right Ear: External ear normal.  Left Ear: External ear normal.  Nose: Nose normal.  Mouth/Throat: Oropharynx is clear and moist.  Eyes: Conjunctivae are normal. Pupils are equal, round, and reactive to light. Right eye exhibits no discharge. Left eye exhibits no discharge.  Neck: Normal range of motion. Neck supple. No JVD present. No tracheal deviation present. No thyromegaly present.  Cardiovascular: Normal rate and normal heart sounds.   irreg irreg  Pulmonary/Chest: No stridor. No respiratory distress. She has no wheezes.  Abdominal: Soft. Bowel sounds are normal. She exhibits no distension and no mass. There is no tenderness. There is no rebound and no guarding.  Musculoskeletal: She exhibits edema. She exhibits no tenderness.  Lymphadenopathy:    She has no cervical adenopathy.  Neurological: She displays normal reflexes. No cranial nerve deficit. She exhibits normal muscle tone. Coordination normal.  Skin: No rash noted. No erythema.  Psychiatric: She has a normal mood and affect. Her behavior is normal. Judgment and thought content normal.  mild tremor LS is tender w/ROM L knee is much better;  scar is ok L calf NT, not swollen - trace abd s/nt     Lab Results  Component Value Date   WBC 3.1* 12/05/2014   HGB 12.7 12/05/2014   HCT 38.6 12/05/2014   PLT 279.0 12/05/2014   GLUCOSE 82 12/05/2014   CHOL 189 10/30/2014   TRIG 151.0* 10/30/2014   HDL 41.30 10/30/2014   LDLDIRECT 178.0 09/24/2011   LDLCALC 118* 10/30/2014   ALT 11 12/05/2014   AST 18 12/05/2014   NA 139 12/05/2014   K 3.7 12/05/2014   CL 104 12/05/2014   CREATININE 0.65 12/05/2014   BUN 10 12/05/2014   CO2 30 12/05/2014   TSH 1.50 10/30/2014   INR 2.3 12/03/2014    [place]: no DVT     Assessment & Plan:

## 2014-12-06 NOTE — Assessment & Plan Note (Signed)
Stable

## 2014-12-06 NOTE — Assessment & Plan Note (Signed)
Gabapentin should help Start Remeron in 7-10 d

## 2014-12-06 NOTE — Patient Instructions (Signed)
You can start Mirtazapine in 7-10 days

## 2014-12-17 ENCOUNTER — Ambulatory Visit (INDEPENDENT_AMBULATORY_CARE_PROVIDER_SITE_OTHER): Payer: Medicare Other | Admitting: General Practice

## 2014-12-17 DIAGNOSIS — I4891 Unspecified atrial fibrillation: Secondary | ICD-10-CM | POA: Diagnosis not present

## 2014-12-17 LAB — POCT INR: INR: 1.6

## 2014-12-17 NOTE — Progress Notes (Signed)
Pre visit review using our clinic review tool, if applicable. No additional management support is needed unless otherwise documented below in the visit note. 

## 2014-12-23 ENCOUNTER — Ambulatory Visit (INDEPENDENT_AMBULATORY_CARE_PROVIDER_SITE_OTHER): Payer: Medicare Other | Admitting: Physician Assistant

## 2014-12-23 VITALS — BP 142/64 | HR 61 | Temp 97.4°F | Resp 16 | Ht 62.0 in | Wt 128.5 lb

## 2014-12-23 DIAGNOSIS — R1013 Epigastric pain: Secondary | ICD-10-CM

## 2014-12-23 DIAGNOSIS — Z79899 Other long term (current) drug therapy: Secondary | ICD-10-CM | POA: Diagnosis not present

## 2014-12-23 DIAGNOSIS — L309 Dermatitis, unspecified: Secondary | ICD-10-CM

## 2014-12-23 MED ORDER — TRIAMCINOLONE ACETONIDE 0.1 % EX CREA: 1.0000 "application " | TOPICAL_CREAM | Freq: Two times a day (BID) | CUTANEOUS | Status: DC

## 2014-12-23 NOTE — Progress Notes (Signed)
Subjective:    Patient ID: Alyssa Luna, female    DOB: [birth year], 81 y.o.   MRN: [id number]  Chief Complaint  Patient presents with  . Contact Dermatitis    Legs. and other places on body that itches randomly  . Abdominal Pain    burning sensation from throat to abdomen  . Medication Concerns   Prior to Admission medications   Medication Sig Start Date End Date Taking? Authorizing Provider  carvedilol (COREG) 3.125 MG tablet TAKE 1 TABLET BY MOUTH 2 TIMES DAILY. 10/15/14  Yes Evans Lance, MD  Cholecalciferol 1000 UNITS tablet Take 1,000 Units by mouth daily. D-3   Yes Historical Provider, MD  Cyanocobalamin (VITAMIN B-12 CR) 1000 MCG TBCR Take 1,000 mcg by mouth daily.    Yes Historical Provider, MD  docusate sodium (COLACE) 100 MG capsule 1 tab 2 times a day while on narcotics.  STOOL SOFTENER 08/28/14  Yes Kirstin Shepperson, PA-C  flecainide (TAMBOCOR) 50 MG tablet Take 1 and 1/2 tablets by  mouth two times daily 09/17/14  Yes Evans Lance, MD  omeprazole (PRILOSEC) 20 MG capsule Take 1 capsule (20 mg total) by mouth 2 (two) times daily before a meal. 10/30/14  Yes Aleksei Plotnikov V, MD  ondansetron (ZOFRAN) 4 MG tablet Take 1 tablet (4 mg total) by mouth every 8 (eight) hours as needed for nausea or vomiting. 10/02/14  Yes Aleksei Plotnikov V, MD  polyethylene glycol (MIRALAX / GLYCOLAX) packet Take 17 g by mouth 2 (two) times daily. Patient taking differently: Take 17 g by mouth daily as needed.  08/28/14  Yes Kirstin Shepperson, PA-C  triamcinolone ointment (KENALOG) 0.1 % Apply 1 application topically 2 (two) times daily.   Yes Historical Provider, MD  warfarin (COUMADIN) 3 MG tablet Take as directed by the  coumadin clinic. 09/17/14  Yes Aleksei Plotnikov V, MD  mirtazapine (REMERON) 15 MG tablet Take one daily at 4-5 pm to improve anxiety, insomnia, poor appetite. Patient not taking: Reported on 12/23/2014 12/06/14   Lew Dawes V, MD  triamcinolone cream (KENALOG) 0.1 %  Apply 1 application topically 2 (two) times daily. 12/23/14   [name], PA   Medications, allergies, past medical history, surgical history, family history, social history and problem list reviewed and updated.  HPI  81 yof presents with abd pain, itchy/red legs, and concerns about her medications.   Legs - Bilateral itching, burning for past 2 months. Has seen derm and was told was dermatitis. Given triamcinolone 0.1% cream which irritated her. Applied Eucrein cream one time which burned. She thinks she's allergic to her laundry detergent and her cat. Recently changed her laundry detergent. Her pcp told her to take allegra but she didn't think this was helpful. Denies any other itchy areas or redness. Denies eczema hx. She has not been sleeping well past few wks due to legs itching, burning at night and feels fatigued today.   Abd pain - Is actually a burning sensation from throat to abd. Has hx gerd. Has been on nexium for yrs. Used to be on qd but started bid approx 6 months ago due to worsening sx. The epigastric burning has worsened past week. Occuring after all meals. Resolves several hrs later. She does not take NSAIDs. Had a course of prednisone in Jan and a curse in March. Has never had upper endo. Burning does not radiate. Is not assoc with exertion.   Medication concerns - Extensive med reconciliation done with pt. She  explained in detail her recent medical issues including knee replacement in Jan, diverticulitis, and an elevated INR. She has gotten started on different meds over past year due to diff issues and is concerned with the number of meds. She is also concerned about meds interfering with her coumadin. She wonders how many she needs to be on.   Has appt to see her pcp Dr Alain Marion on May 20th. She is planning to call his office tomo am to get scheduled sooner. Also scheduled to see cardiology in 2 wks for f/u.   Review of Systems No fevers, chills.     Objective:    Physical Exam  Constitutional: She is oriented to person, place, and time. She appears well-developed and well-nourished.  Non-toxic appearance. She does not have a sickly appearance. She does not appear ill. No distress.  BP 142/64 mmHg  Pulse 61  Temp(Src) 97.4 F (36.3 C) (Oral)  Resp 16  Ht 5' 2"  (1.575 m)  Wt 128 lb 8 oz (58.287 kg)  BMI 23.50 kg/m2  SpO2 93%   Abdominal: Soft. Normal appearance and bowel sounds are normal.  Neurological: She is alert and oriented to person, place, and time.  Skin:  Dry, flaky skin diffusely over bilateral legs. No edema. Mild erythema/excoriations bilateral LE. No scaling.       Assessment & Plan:   81 yof presents with abd pain, itchy/red legs, and concerns about her medications.   Epigastric burning sensation - Plan: H. pylori breath test --concern for ulcer, esophageal cause of sx --h pylori test today, no recent NSAID use --continue ppi bid  --pt planning to call pcp tomo am to attempt to get in to see asap --> instructed to inform pcp of recent increase reflux sx, suggested he may recommend upper endo for visualization due to worsening sx in this 81 yof if he sees fit  Dermatitis - Plan: triamcinolone cream (KENALOG) 0.1 % --likely contact dermatitis --legs dry --> start with cetaphil several times daily for next few days, once moisturized apply triamcinolone bid for 1-2 wks --encouraged to speak to pcp about allergy referral   Encounter for medication review --med reconciliation done --instructed to f/u with pcp to determine her medical need for diff agents and whether she can go off of any --she is agreeable to this  Greater than 50 minutes spent with pt discussing her medications, possible side effects, and tx options.   [name], PA-C Physician Assistant-Certified [facility]  12/23/2014 4:42 PM

## 2014-12-23 NOTE — Patient Instructions (Signed)
I think your leg itching and redness is most likely due to a contact/allergic dermatitis. Please apply the steroid toipical twice daily to these areas on hte laegs for the next 1-2 weeks.  Please apply a moisturizer like Eucerin or Cetaphil several times per day. For the burning after meals in the chest, we are checking for an H pylori infection today. We will let you know these results and treat if needed.  Please be sure to contact your primary care doctor tomorrow as you mentioned. Please mention this new burning to them to see if they want to refer you to GI for a possible upper endoscopy.

## 2014-12-24 ENCOUNTER — Telehealth: Payer: Self-pay | Admitting: Internal Medicine

## 2014-12-24 ENCOUNTER — Telehealth: Payer: Self-pay

## 2014-12-24 LAB — H. PYLORI BREATH TEST: H. PYLORI BREATH TEST: NOT DETECTED

## 2014-12-24 NOTE — Telephone Encounter (Signed)
Pt informed of 12/23/14 H. Pylori breath test. She was seen at [facility] 12/23/14 and advised to f/u with PCP. I offered OV with another MD since PCP is out of office all week.  OV scheduled for 12/26/14 with Dr. Linna Darner.

## 2014-12-24 NOTE — Telephone Encounter (Signed)
Patient wants to know if you could pull the test she had done at the [facility] yesterday and possible give a call back with results.  She also states that they told her to see an allergist.  She is asking for Dr. Alain Marion to refer her to an Allergist that could get her in as soon as possible.  I did tell patient that Dr. Camila Li was out for the week and that she could not get a referral entered until her gets back.  Also, notified her that I didn't know if Marzetta Board could give out results, that she might have to wait on the Urgent Care to call her back with those results.

## 2014-12-24 NOTE — Telephone Encounter (Signed)
Pt states she is just feeling terrible,have her labs come back with any results,   Best phone for pt is [phone]

## 2014-12-25 NOTE — Telephone Encounter (Signed)
See labs. Pt notified. See phone message from Dr. Mickey Farber office.

## 2014-12-26 ENCOUNTER — Ambulatory Visit (INDEPENDENT_AMBULATORY_CARE_PROVIDER_SITE_OTHER): Payer: Medicare Other | Admitting: Internal Medicine

## 2014-12-26 ENCOUNTER — Ambulatory Visit (INDEPENDENT_AMBULATORY_CARE_PROVIDER_SITE_OTHER): Payer: Medicare Other | Admitting: General Practice

## 2014-12-26 ENCOUNTER — Encounter: Payer: Self-pay | Admitting: Internal Medicine

## 2014-12-26 VITALS — BP 124/76 | HR 62 | Temp 98.4°F | Ht 62.0 in | Wt 128.2 lb

## 2014-12-26 DIAGNOSIS — K2100 Gastro-esophageal reflux disease with esophagitis, without bleeding: Secondary | ICD-10-CM

## 2014-12-26 DIAGNOSIS — I4891 Unspecified atrial fibrillation: Secondary | ICD-10-CM | POA: Diagnosis not present

## 2014-12-26 LAB — POCT INR: INR: 2.4

## 2014-12-26 MED ORDER — SUCRALFATE 1 G PO TABS: ORAL_TABLET | ORAL | Status: DC

## 2014-12-26 NOTE — Progress Notes (Signed)
Pre visit review using our clinic review tool, if applicable. No additional management support is needed unless otherwise documented below in the visit note. 

## 2014-12-26 NOTE — Progress Notes (Signed)
Agree with plan 

## 2014-12-26 NOTE — Progress Notes (Signed)
   Subjective:    Patient ID: Alyssa Luna, female    DOB: 1933-10-24, 81 y.o.   MRN: [id number]  HPI  She has been having GI symptoms since she had her total knee replacement in January this year. She attributes symptoms to an exacerbation of a known hiatal hernia related to hydrocodone and other multiple medications perioperatively. She describes increased burping and burning in her throat. She has had anorexia since the surgery.Her weight has dropped from 153-128 pounds. Her appetite now is improving off these medications. She has minimal dysphagia but no other GI symptoms. She has been taking Prilosec up to twice a day; she decreased this to once daily over the last few days. Approximately she was seen in urgent care 5/8 and H. Pylori by inhalation testing was negative.  She ingests minimal amounts of chocolate. She denies triggers of the anti-inflammatory agents, alcohol, peppermint intake, or tobacco.  Past history includes hiatal hernia on endoscopy.  Review of Systems Abdominal pain, dysphagia, melena, rectal bleeding, or persistently small caliber stools are denied.    Objective:   Physical Exam  Pertinent or positive findings include: She has a grade 1/2 systolic murmur at the base. Heart rhythm is regular. There is increase in the second heart sound.  There is asymmetry of the posterior thoracic musculature suggesting some kyphoscoliosis. She has marked mixed arthritic change in her hands. Pedal pulses are decreased.  General appearance :adequately nourished; in no distress. Eyes: No conjunctival inflammation or scleral icterus is present. Oral exam:  Lips and gums are healthy appearing.There is no oropharyngeal erythema or exudate noted. Dental hygiene is good. Lungs:Chest clear to auscultation; no wheezes, rhonchi,rales ,or rubs present.No increased work of breathing.  Abdomen: bowel sounds normal, soft and non-tender without masses, organomegaly or hernias noted.  No  guarding or rebound.  Vascular : all pulses equal ; no bruits present. Skin:Warm & dry.  Intact without suspicious lesions or rashes ; no tenting or jaundice  Lymphatic: No lymphadenopathy is noted about the head, neck, axilla.  Neuro: Strength, tone & DTRs normal.        Assessment & Plan:  #1 GERD in the setting of hiatal hernia with esophagitis  Plan: She should continue the protein pump inhibitor twice a day. Antireflux measures. GI referral because of the multiple comorbidities including warfarin

## 2014-12-26 NOTE — Patient Instructions (Addendum)
Reflux of gastric acid may be asymptomatic as this may occur mainly during sleep.The triggers for reflux  include stress; the "aspirin family" ; alcohol; peppermint; and caffeine (coffee, tea, cola, and chocolate). The aspirin family would include aspirin and the nonsteroidal agents such as ibuprofen &  Naproxen. Tylenol would not cause reflux. If having symptoms ; food & drink should be avoided for @ least 2 hours before going to bed. Take the Prilosec 30 minutes before breakfast and 30 minutes before the evening meal for 8 weeks then go back to once a day  30 minutes before breakfast. The GI referral will be scheduled and you'll be notified of the time.Please call the Referral Co-Ordinator @ [phone] if you have not been notified of appointment time within 7-10 days.

## 2014-12-31 ENCOUNTER — Encounter: Payer: Self-pay | Admitting: Internal Medicine

## 2015-01-02 ENCOUNTER — Ambulatory Visit (INDEPENDENT_AMBULATORY_CARE_PROVIDER_SITE_OTHER): Payer: Medicare Other | Admitting: Internal Medicine

## 2015-01-02 ENCOUNTER — Encounter: Payer: Self-pay | Admitting: Internal Medicine

## 2015-01-02 ENCOUNTER — Other Ambulatory Visit (INDEPENDENT_AMBULATORY_CARE_PROVIDER_SITE_OTHER): Payer: Medicare Other

## 2015-01-02 VITALS — BP 100/70 | HR 66 | Ht 65.0 in | Wt 128.0 lb

## 2015-01-02 DIAGNOSIS — I480 Paroxysmal atrial fibrillation: Secondary | ICD-10-CM | POA: Diagnosis not present

## 2015-01-02 DIAGNOSIS — R55 Syncope and collapse: Secondary | ICD-10-CM

## 2015-01-02 DIAGNOSIS — I10 Essential (primary) hypertension: Secondary | ICD-10-CM

## 2015-01-02 DIAGNOSIS — R001 Bradycardia, unspecified: Secondary | ICD-10-CM | POA: Diagnosis not present

## 2015-01-02 LAB — BASIC METABOLIC PANEL
BUN: 12 mg/dL (ref 6–23)
CO2: 30 mEq/L (ref 19–32)
Calcium: 9.6 mg/dL (ref 8.4–10.5)
Chloride: 104 mEq/L (ref 96–112)
Creatinine, Ser: 0.77 mg/dL (ref 0.40–1.20)
GFR: 76.8 mL/min (ref >60.00)
GLUCOSE: 86 mg/dL (ref 70–99)
POTASSIUM: 3.9 meq/L (ref 3.5–5.1)
SODIUM: 139 meq/L (ref 135–145)

## 2015-01-02 LAB — HEPATIC FUNCTION PANEL
ALT: 12 U/L (ref 0–35)
AST: 18 U/L (ref 0–37)
Albumin: 3.9 g/dL (ref 3.5–5.2)
Alkaline Phosphatase: 51 U/L (ref 39–117)
Bilirubin, Direct: 0.1 mg/dL (ref 0.0–0.3)
TOTAL PROTEIN: 6.5 g/dL (ref 6.0–8.3)
Total Bilirubin: 0.6 mg/dL (ref 0.2–1.2)

## 2015-01-02 MED ORDER — APIXABAN 5 MG PO TABS: 5.0000 mg | ORAL_TABLET | Freq: Two times a day (BID) | ORAL | Status: DC

## 2015-01-02 NOTE — Assessment & Plan Note (Signed)
Her blood pressure is been well-controlled. She will continue her current medications and maintain a low-sodium diet.

## 2015-01-02 NOTE — Assessment & Plan Note (Signed)
She is maintaining sinus rhythm very nicely. She will maintain flecainide 75 mg twice daily. She will continue systemic anticoagulation. After a lengthy discussion, she will stop warfarin and start Eliquis.

## 2015-01-02 NOTE — Assessment & Plan Note (Signed)
She has had no recurrent episodes. She will undergo watchful waiting.

## 2015-01-02 NOTE — Patient Instructions (Signed)
Medication Instructions:  Your physician has recommended you make the following change in your medication:  1) STOP Coumadin 2) START Eliquis 5 mg twice a day  -- start this 3 days after stopping Coumadin  Labwork: None ordered  Testing/Procedures: None ordered  Follow-Up: Your physician wants you to follow-up in: 6 months with Dr. Lovena Le. You will receive a reminder letter in the mail two months in advance. If you don't receive a letter, please call our office to schedule the follow-up appointment.   Thank you for choosing [facility]!!
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
## 2015-01-02 NOTE — Assessment & Plan Note (Signed)
She has very mild sinus node dysfunction and is currently asymptomatic. She will undergo watchful waiting.

## 2015-01-04 ENCOUNTER — Ambulatory Visit (INDEPENDENT_AMBULATORY_CARE_PROVIDER_SITE_OTHER): Payer: Medicare Other | Admitting: Internal Medicine

## 2015-01-04 ENCOUNTER — Encounter: Payer: Self-pay | Admitting: Internal Medicine

## 2015-01-04 VITALS — BP 120/80 | HR 64 | Wt 128.0 lb

## 2015-01-04 DIAGNOSIS — B370 Candidal stomatitis: Secondary | ICD-10-CM | POA: Diagnosis not present

## 2015-01-04 DIAGNOSIS — L299 Pruritus, unspecified: Secondary | ICD-10-CM | POA: Insufficient documentation

## 2015-01-04 DIAGNOSIS — E538 Deficiency of other specified B group vitamins: Secondary | ICD-10-CM | POA: Diagnosis not present

## 2015-01-04 DIAGNOSIS — I482 Chronic atrial fibrillation, unspecified: Secondary | ICD-10-CM

## 2015-01-04 DIAGNOSIS — I10 Essential (primary) hypertension: Secondary | ICD-10-CM

## 2015-01-04 MED ORDER — CIMETIDINE 400 MG PO TABS: 400.0000 mg | ORAL_TABLET | Freq: Two times a day (BID) | ORAL | Status: DC

## 2015-01-04 MED ORDER — CARVEDILOL 3.125 MG PO TABS: ORAL_TABLET | ORAL | Status: DC

## 2015-01-04 MED ORDER — CYANOCOBALAMIN 1000 MCG/ML IJ SOLN
1000.0000 ug | Freq: Once | INTRAMUSCULAR | Status: AC
  Administered 2015-01-04: 1000 ug via INTRAMUSCULAR

## 2015-01-04 MED ORDER — CETIRIZINE HCL 10 MG PO TABS: 10.0000 mg | ORAL_TABLET | Freq: Every day | ORAL | Status: DC

## 2015-01-04 MED ORDER — NYSTATIN 100000 UNIT/ML MT SUSP: 500000.0000 [IU] | Freq: Four times a day (QID) | OROMUCOSAL | Status: DC

## 2015-01-04 MED ORDER — DIPHENHYDRAMINE HCL 25 MG PO CAPS
25.0000 mg | ORAL_CAPSULE | Freq: Four times a day (QID) | ORAL | Status: DC | PRN
Start: 2015-01-04 — End: 2015-01-07

## 2015-01-04 NOTE — Assessment & Plan Note (Signed)
5/16 mouth and probably throat/esoph Nystatin po
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
## 2015-01-04 NOTE — Assessment & Plan Note (Signed)
Chronic  On Coreg

## 2015-01-04 NOTE — Assessment & Plan Note (Signed)
Dr Lovena Le On Flecanide, Coreg, Eliquis

## 2015-01-04 NOTE — Progress Notes (Signed)
Pre visit review using our clinic review tool, if applicable. No additional management support is needed unless otherwise documented below in the visit note. 

## 2015-01-04 NOTE — Assessment & Plan Note (Signed)
On B12 

## 2015-01-04 NOTE — Assessment & Plan Note (Signed)
?  due to meds Zyrtec, Tagamet, Benadryl, lotion

## 2015-01-07 ENCOUNTER — Ambulatory Visit: Payer: Medicare Other

## 2015-01-07 ENCOUNTER — Other Ambulatory Visit: Payer: Self-pay

## 2015-01-07 ENCOUNTER — Ambulatory Visit (INDEPENDENT_AMBULATORY_CARE_PROVIDER_SITE_OTHER): Payer: Medicare Other | Admitting: Physician Assistant

## 2015-01-07 ENCOUNTER — Other Ambulatory Visit (INDEPENDENT_AMBULATORY_CARE_PROVIDER_SITE_OTHER): Payer: Medicare Other

## 2015-01-07 ENCOUNTER — Encounter: Payer: Self-pay | Admitting: Physician Assistant

## 2015-01-07 VITALS — BP 136/68 | HR 60 | Ht 62.0 in | Wt 127.6 lb

## 2015-01-07 DIAGNOSIS — I482 Chronic atrial fibrillation: Secondary | ICD-10-CM | POA: Diagnosis not present

## 2015-01-07 DIAGNOSIS — R1314 Dysphagia, pharyngoesophageal phase: Secondary | ICD-10-CM

## 2015-01-07 DIAGNOSIS — R1310 Dysphagia, unspecified: Secondary | ICD-10-CM

## 2015-01-07 DIAGNOSIS — Z7901 Long term (current) use of anticoagulants: Secondary | ICD-10-CM

## 2015-01-07 LAB — CBC WITH DIFFERENTIAL/PLATELET
Basophils Absolute: 0 10*3/uL (ref 0.0–0.1)
Basophils Relative: 0.8 % (ref 0.0–3.0)
Eosinophils Absolute: 0.1 10*3/uL (ref 0.0–0.7)
Eosinophils Relative: 1.4 % (ref 0.0–5.0)
HCT: 40.8 % (ref 36.0–46.0)
HEMOGLOBIN: 13.4 g/dL (ref 12.0–15.0)
LYMPHS PCT: 37.4 % (ref 12.0–46.0)
Lymphs Abs: 1.7 10*3/uL (ref 0.7–4.0)
MCHC: 32.9 g/dL (ref 30.0–36.0)
MCV: 79.7 fl (ref 78.0–100.0)
MONOS PCT: 9 % (ref 3.0–12.0)
Monocytes Absolute: 0.4 10*3/uL (ref 0.1–1.0)
NEUTROS ABS: 2.4 10*3/uL (ref 1.4–7.7)
Neutrophils Relative %: 51.4 % (ref 43.0–77.0)
PLATELETS: 270 10*3/uL (ref 150.0–400.0)
RBC: 5.12 Mil/uL — ABNORMAL HIGH (ref 3.87–5.11)
RDW: 18.4 % — ABNORMAL HIGH (ref 11.5–15.5)
WBC: 4.6 10*3/uL (ref 4.0–10.5)

## 2015-01-07 LAB — PROTIME-INR
INR: 2.2 ratio — ABNORMAL HIGH (ref 0.8–1.0)
PROTHROMBIN TIME: 24.2 s — AB (ref 9.6–13.1)

## 2015-01-07 MED ORDER — SUCRALFATE 1 GM/10ML PO SUSP: 1.0000 g | Freq: Three times a day (TID) | ORAL | Status: DC

## 2015-01-07 MED ORDER — FLUCONAZOLE 100 MG PO TABS: 100.0000 mg | ORAL_TABLET | Freq: Every day | ORAL | Status: AC

## 2015-01-07 NOTE — Progress Notes (Addendum)
Patient ID: Alyssa Luna, female   DOB: 1934-05-16, 81 y.o.   MRN: [id number]   Subjective:    Patient ID: Alyssa Luna, female    DOB: 1934/02/23, 81 y.o.   MRN: [id number]  HPI Alyssa Luna  Is a pleasant 81 year old white female known to Dr.Pyrtle from prior colonoscopy done in 2013. She had severe diverticulosis diffusely small internal hemorrhoids and a 5 mm sessile polyp was removed but not retrieved.   She has history of prior breast cancer, GERD, degenerative joint disease, osteoporosis, and atrial fibrillation. She comes in today with complaints of dysphagia and odynophagia over the past 3-4 weeks. She relates having a very difficult year since the beginning of 2016. She had surgery on her knee then had problems with a supratherapeutic INR which caused bleeding into her leg. She also had an ER visit in February and was treated for what was felt to be diverticulitis with a course of amoxicillin. She has also had steroids for her leg. More recently she's been having problems with pruritus and has a dermatology appointment later today.   as part of the workup for the pruritus she has been having medications aluminated and was taken off of her chronic Prilosec recently and this was substituted for Tagamet. She had been to urgent care within the past couple of weeks with complaints of burning with swallowing. She was tested for H. Pylori and this was negative but no other medical management. She had seen Dr. Linna Darner last week and was started on Mycostatin for possible thrush. She also says that her mouth is been feeling dry somewhat swollen and recalls having more thrush once in the past. She has only been on the Mycostatin over the past few days but has not noticed any improvement. She says that everything she eats or drinks burns all the way down her chest. She has no complaints of abdominal pain. No fever or chills. She does not feel that food is sticking necessarily just uncomfortable with  swallowing.  Patient has had prior EGD as well and has a known 5 cm hiatal hernia and a Schatzki's ring which was dilated in 2013.  Recall patient is on chronic Coumadin with history of atrial fibrillation.   Review of Systems Pertinent positive and negative review of systems were noted in the above HPI section.  All other review of systems was otherwise negative.  Outpatient Encounter Prescriptions as of 01/07/2015  Medication Sig  . carvedilol (COREG) 3.125 MG tablet TAKE 1 TABLET BY MOUTH 2 TIMES DAILY.  . cetirizine (ZYRTEC) 10 MG tablet Take 1 tablet (10 mg total) by mouth daily.  . Cholecalciferol 1000 UNITS tablet Take 1,000 Units by mouth daily. D-3  . cimetidine (TAGAMET) 400 MG tablet Take 1 tablet (400 mg total) by mouth 2 (two) times daily.  . Cyanocobalamin (VITAMIN B-12 IJ) Inject as directed every 30 (thirty) days.  [name] docusate sodium (COLACE) 100 MG capsule 1 tab 2 times a day while on narcotics.  STOOL SOFTENER  . flecainide (TAMBOCOR) 50 MG tablet Take 1 and 1/2 tablets by  mouth two times daily  . nystatin (MYCOSTATIN) 100000 UNIT/ML suspension Take 5 mLs (500,000 Units total) by mouth 4 (four) times daily. Swish, hold, swallow  . ondansetron (ZOFRAN) 4 MG tablet Take 1 tablet (4 mg total) by mouth every 8 (eight) hours as needed for nausea or vomiting.  . polyethylene glycol (MIRALAX / GLYCOLAX) packet Take 17 g by mouth 2 (two) times daily. (Patient taking differently:  Take 17 g by mouth daily as needed. )  . fluconazole (DIFLUCAN) 100 MG tablet Take 1 tablet (100 mg total) by mouth daily.  . sucralfate (CARAFATE) 1 GM/10ML suspension Take 10 mLs (1 g total) by mouth 4 (four) times daily -  with meals and at bedtime.  . [DISCONTINUED] apixaban (ELIQUIS) 5 MG TABS tablet Take 1 tablet (5 mg total) by mouth 2 (two) times daily. (Patient not taking: Reported on 01/04/2015)  . [DISCONTINUED] diphenhydrAMINE (BENADRYL) 25 mg capsule Take 1 capsule (25 mg total) by mouth every 6  (six) hours as needed for itching or sleep (1-2 at hs prn itching).  . [DISCONTINUED] mirtazapine (REMERON) 15 MG tablet Take one daily at 4-5 pm to improve anxiety, insomnia, poor appetite.  . [DISCONTINUED] sucralfate (CARAFATE) 1 G tablet Dissolve 1 tablet in 5 cc (1 teaspoon) of water. Take before meals and at bedtime (Patient not taking: Reported on 01/04/2015)  . [DISCONTINUED] triamcinolone cream (KENALOG) 0.1 % Apply 1 application topically 2 (two) times daily.   No facility-administered encounter medications on file as of 01/07/2015.   Allergies  Allergen Reactions  . Pravastatin Anaphylaxis  . Augmentin [Amoxicillin-Pot Clavulanate]     rash  . Ciprofloxacin Nausea Only    GI upset   . Flagyl [Metronidazole]   . Simvastatin     REACTION: leg cramps, weakness  . Tramadol     Watery eyes   Patient Active Problem List   Diagnosis Date Noted  . Thrush, oral 01/04/2015  . Itching 01/04/2015  . Hypokalemia 11/06/2014  . Fatigue 10/19/2014  . LLQ abdominal pain 10/02/2014  . Swelling of left knee joint 10/02/2014  . Nausea without vomiting 10/02/2014  . DJD (degenerative joint disease) of knee 08/27/2014  . Primary localized osteoarthritis of left knee   . Breast cancer   . GERD (gastroesophageal reflux disease)   . Preop exam for internal medicine 06/13/2014  . Anxiety state 06/13/2014  . Bradycardia 11/09/2013  . Essential hypertension 11/09/2013  . Well adult exam 05/21/2013  . Preop cardiovascular exam 05/08/2013  . Essential tremor 12/14/2012  . Right hip pain 05/10/2012  . Vertigo 03/09/2012  . Dyslipidemia 03/31/2011  . Meningioma 02/03/2011  . TIA (transient ischemic attack) 11/13/2010  . Abnormal CT of brain 11/13/2010  . CAROTID BRUIT 07/28/2010  . Edema 05/02/2010  . Atrial fibrillation 04/08/2010  . SYNCOPE 03/31/2010  . LOW BACK PAIN 06/05/2009  . Chronic maxillary sinusitis 01/31/2009  . EFFUSION OF JOINT OTHER SPECIFIED SITE 01/31/2009  . Diarrhea  05/31/2008  . ABNORMAL CHEST XRAY 05/15/2008  . HEMATURIA, MICROSCOPIC, HX OF 08/04/2007  . B12 deficiency 03/21/2007  . Vitamin D deficiency 03/21/2007  . Disease of tricuspid valve 03/21/2007  . DIVERTICULOSIS, COLON 03/21/2007  . OSTEOARTHRITIS 03/21/2007  . OSTEOPOROSIS 03/21/2007  . Personal history of malignant neoplasm of breast 03/21/2007  . COLONIC POLYPS, HX OF 03/21/2007   History   Social History  . Marital Status: Single    Spouse Name: N/A  . Number of Children: N/A  . Years of Education: N/A   Occupational History  . [employer], Retired    Social History Main Topics  . Smoking status: Former Research scientist (life sciences)  . Smokeless tobacco: Never Used  . Alcohol Use: No  . Drug Use: No  . Sexual Activity: No     Comment: HYST   Other Topics Concern  . Not on file   Social History Narrative   ** Merged History Encounter **  Regular Exercise -  NO      Ms. Rosebrook's family history includes Breast cancer (age of onset: 59) in her mother; Colon cancer in her maternal aunt; Ovarian cancer in her maternal grandmother; Tremor in her brother and mother. There is no history of Early death or Stroke.      Objective:    Filed Vitals:   01/07/15 0921  BP: 136/68  Pulse: 60    Physical Exam   Well-developed older white female in no acute distress, pleasant blood pressure 136/68 pulse 60 height 5 foot 2 weight 127. HEENT; nontraumatic normocephalic EOMI PERRLA sclera anicteric oropharynx just slight yellowish patchy coating on her tongue that no plaques noted in the pharynx or upper palate , Supple; no JVD, Cardiovascular r;egular rate and rhythm with S1-S2 no murmur or gallop, Pulmonary; clear bilaterally, Abdomen; soft nontender nondistended bowel sounds are active there is no palpable mass or hepatosplenomegaly , Rectal ;exam not done, Ext;no clubbing cyanosis or edema skin warm and dry, Psych ;mood and affect appropriate       Assessment & Plan:   #1 81 yo female with  3- week hx of dysphagia and odynopahgia-suspect acute esophagitis /probably candidiasis #2 chronic GERD, hx of schatzki's  ring  #3 chronic anticoagulaltion- Coumadin #4 atrial fib #5 severe diverticulosis #6 hx breast CA #[id number]  Plan Ba swallow - hoping to avoid EGD  Start Diflucan 100 mg po daily x 14 days Start Carafate 1 gm between meals and at bedtime She will continue BID tagamet for now- but plan to switch back to Prilosec  Soon Soft diet     Ravon Mortellaro Genia Harold PA-C 01/07/2015   Cc: Hendricks Limes, MD   Addendum: Reviewed and agree with initial management. Jerene Bears, MD

## 2015-01-07 NOTE — Patient Instructions (Signed)
Please go to the basement level to have your labs drawn.  Come back to the lab on 01-15-2015 Tues for a repeat on the PT test.  We sent prescriptions to [place].  Aneth.   Barium Swallow test is at [facility] on Thursday 5-26. Arrive at 10 : 15.  Have nothing by mouth after 7:15 am.

## 2015-01-10 ENCOUNTER — Ambulatory Visit (HOSPITAL_COMMUNITY)
Admission: RE | Admit: 2015-01-10 | Discharge: 2015-01-10 | Disposition: A | Discharge status: Home / Self Care | Payer: Medicare Other | Source: Ambulatory Visit | Attending: Physician Assistant | Admitting: Physician Assistant

## 2015-01-10 DIAGNOSIS — K224 Dyskinesia of esophagus: Secondary | ICD-10-CM | POA: Diagnosis not present

## 2015-01-10 DIAGNOSIS — K449 Diaphragmatic hernia without obstruction or gangrene: Secondary | ICD-10-CM | POA: Insufficient documentation

## 2015-01-10 DIAGNOSIS — R1310 Dysphagia, unspecified: Secondary | ICD-10-CM

## 2015-01-10 DIAGNOSIS — R1314 Dysphagia, pharyngoesophageal phase: Secondary | ICD-10-CM

## 2015-01-15 ENCOUNTER — Other Ambulatory Visit (INDEPENDENT_AMBULATORY_CARE_PROVIDER_SITE_OTHER): Payer: Medicare Other

## 2015-01-15 ENCOUNTER — Ambulatory Visit: Payer: Medicare Other | Admitting: General Practice

## 2015-01-15 ENCOUNTER — Telehealth: Payer: Self-pay | Admitting: Physician Assistant

## 2015-01-15 DIAGNOSIS — R1314 Dysphagia, pharyngoesophageal phase: Secondary | ICD-10-CM

## 2015-01-15 DIAGNOSIS — R1310 Dysphagia, unspecified: Secondary | ICD-10-CM

## 2015-01-15 DIAGNOSIS — I4891 Unspecified atrial fibrillation: Secondary | ICD-10-CM

## 2015-01-15 LAB — PROTIME-INR
INR: 6.4 ratio (ref 0.8–1.0)
Prothrombin Time: 67.8 s (ref 9.6–13.1)

## 2015-01-15 NOTE — Telephone Encounter (Signed)
Can stop carafate Call if symptoms persist

## 2015-01-15 NOTE — Telephone Encounter (Signed)
Patient is in agreement with stopping the Carafate. She wants to finish the Fluconazole. She is in touch with the coumadin clinic and they have given her instructions on her Warfarin.

## 2015-01-15 NOTE — Progress Notes (Signed)
Pre visit review using our clinic review tool, if applicable. No additional management support is needed unless otherwise documented below in the visit note. 

## 2015-01-15 NOTE — Progress Notes (Signed)
I have reviewed and agree with the plan.

## 2015-01-15 NOTE — Telephone Encounter (Signed)
Her INR was 6.4 and the Coumadin Clinic is helping her with that. She is on Diflucan, Carafate and has recently restarted Prilosec. She is very lightheaded after taking the Carafate. She wants to know how long she needs to be on Carafate. She states she has not had any further problems with her esophagus. She is interested in stopping the Carafate.

## 2015-01-16 ENCOUNTER — Telehealth: Payer: Self-pay | Admitting: Physician Assistant

## 2015-01-17 ENCOUNTER — Telehealth: Payer: Self-pay | Admitting: Internal Medicine

## 2015-01-17 ENCOUNTER — Telehealth: Payer: Self-pay

## 2015-01-17 ENCOUNTER — Ambulatory Visit: Payer: Medicare Other

## 2015-01-17 NOTE — Telephone Encounter (Signed)
Patient calls again today with the same issues of dizziness and feeling weak. She decided to take the Carafate again today due to no improvement in her dizziness and she had a burning sensation after drinking Gatorade. When pushed for specific symptoms she reports she feels like her knees will buckle if she gets up and she has to sit down. She does not feel like that when she gets up in the morning. She is eating and drinking without any nausea or vomiting. She bowel movements are normal. She just feels weak. No spinning sensation. Advised she call her PCP and discuss. It does not sound like she has any symptoms related to GI. She agrees to do this.

## 2015-01-17 NOTE — Telephone Encounter (Signed)
Advised patient to come in for INR check and ask for Alyssa Luna----patients name has been placed on nurse visit schedule for 4pm

## 2015-01-17 NOTE — Telephone Encounter (Signed)
Please call patient. Her coumadin is high and she has been very shaky. I tried to send her to the nurse line but she really wanted to talk with you about how she is feeling.

## 2015-01-17 NOTE — Telephone Encounter (Signed)
Patient was seen in coumadin clinic, INR resulted at 2.8, patient scheduled appt to be seen by PCP dr plotnikov for 6/10 at 3:15, patient did not want to go to urgent care

## 2015-01-23 ENCOUNTER — Ambulatory Visit (INDEPENDENT_AMBULATORY_CARE_PROVIDER_SITE_OTHER): Payer: Medicare Other | Admitting: *Deleted

## 2015-01-23 DIAGNOSIS — I4891 Unspecified atrial fibrillation: Secondary | ICD-10-CM | POA: Diagnosis not present

## 2015-01-23 LAB — POCT INR: INR: 1.3

## 2015-01-23 NOTE — Progress Notes (Signed)
I have reviewed and agree with the plan.

## 2015-01-23 NOTE — Progress Notes (Signed)
Pre visit review using our clinic review tool, if applicable. No additional management support is needed unless otherwise documented below in the visit note. 

## 2015-01-25 ENCOUNTER — Ambulatory Visit (INDEPENDENT_AMBULATORY_CARE_PROVIDER_SITE_OTHER): Payer: Medicare Other | Admitting: Internal Medicine

## 2015-01-25 ENCOUNTER — Encounter: Payer: Self-pay | Admitting: Internal Medicine

## 2015-01-25 VITALS — BP 164/90 | HR 65 | Wt 128.0 lb

## 2015-01-25 DIAGNOSIS — R110 Nausea: Secondary | ICD-10-CM | POA: Diagnosis not present

## 2015-01-25 DIAGNOSIS — R5383 Other fatigue: Secondary | ICD-10-CM

## 2015-01-25 DIAGNOSIS — L299 Pruritus, unspecified: Secondary | ICD-10-CM | POA: Diagnosis not present

## 2015-01-25 DIAGNOSIS — I480 Paroxysmal atrial fibrillation: Secondary | ICD-10-CM

## 2015-01-25 MED ORDER — SACCHAROMYCES BOULARDII 250 MG PO CAPS: 250.0000 mg | ORAL_CAPSULE | Freq: Two times a day (BID) | ORAL | Status: DC

## 2015-01-25 MED ORDER — DEXLANSOPRAZOLE 30 MG PO CPDR: 30.0000 mg | DELAYED_RELEASE_CAPSULE | Freq: Every day | ORAL | Status: DC
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
## 2015-01-25 NOTE — Progress Notes (Signed)
Pre visit review using our clinic review tool, if applicable. No additional management support is needed unless otherwise documented below in the visit note. 

## 2015-01-25 NOTE — Patient Instructions (Signed)
Cut back on Flecanide (?side effects)

## 2015-01-27 NOTE — Assessment & Plan Note (Signed)
Will try to taper off Flecanide - multiple side effects

## 2015-01-28 ENCOUNTER — Ambulatory Visit (INDEPENDENT_AMBULATORY_CARE_PROVIDER_SITE_OTHER): Payer: Medicare Other | Admitting: General Practice

## 2015-01-28 ENCOUNTER — Ambulatory Visit: Payer: Medicare Other | Admitting: Internal Medicine

## 2015-01-28 DIAGNOSIS — I4891 Unspecified atrial fibrillation: Secondary | ICD-10-CM

## 2015-01-28 LAB — POCT INR: INR: 1.6

## 2015-01-28 NOTE — Progress Notes (Signed)
Pre visit review using our clinic review tool, if applicable. No additional management support is needed unless otherwise documented below in the visit note. 

## 2015-01-30 ENCOUNTER — Ambulatory Visit: Payer: Medicare Other | Admitting: Internal Medicine

## 2015-01-30 ENCOUNTER — Telehealth: Payer: Self-pay | Admitting: Internal Medicine

## 2015-01-30 NOTE — Telephone Encounter (Signed)
I the last 3-4 weeks she has noticed after taking her Flecainide, Carvedilol and Prilosec she would get dizzy and lightheaded.  He changed her Prilosec and suggested she decrease her Flecainide to 50 mg twice daily.  She has done it for the last 3 days and feels better she thinks.  She is going to continue the Coumadin for now due to expenses  She is going to stay at the decreased dose of Flecainide for now and call me back next week

## 2015-01-30 NOTE — Telephone Encounter (Signed)
New Prob   Pt c/o medication issue:  1. Name of Medication: Flecainide   2. How are you currently taking this medication (dosage and times per day)? 50 mg 1.5 BID  3. Are you having a reaction (difficulty breathing--STAT)? Lightheadedness and mild weakness  4. What is your medication issue? PCP advised pt to reduce dose by 1/2 a pill for 1 week due to side effects.    Pt c/o medication issue:  1. Name of Medication: Eliquis  2. How are you currently taking this medication (dosage and times per day)? -  3. Are you having a reaction (difficulty breathing--STAT)? No  4. What is your medication issue? Pt has decided not take this medication.

## 2015-02-01 ENCOUNTER — Emergency Department (HOSPITAL_COMMUNITY): Payer: Medicare Other

## 2015-02-01 ENCOUNTER — Emergency Department (HOSPITAL_COMMUNITY)
Admission: EM | Admit: 2015-02-01 | Discharge: 2015-02-01 | Disposition: A | Discharge status: Home / Self Care | Payer: Medicare Other | Attending: Emergency Medicine | Admitting: Emergency Medicine

## 2015-02-01 ENCOUNTER — Encounter (HOSPITAL_COMMUNITY): Payer: Self-pay

## 2015-02-01 DIAGNOSIS — K219 Gastro-esophageal reflux disease without esophagitis: Secondary | ICD-10-CM | POA: Diagnosis not present

## 2015-02-01 DIAGNOSIS — R0602 Shortness of breath: Secondary | ICD-10-CM | POA: Insufficient documentation

## 2015-02-01 DIAGNOSIS — E785 Hyperlipidemia, unspecified: Secondary | ICD-10-CM | POA: Insufficient documentation

## 2015-02-01 DIAGNOSIS — Z8739 Personal history of other diseases of the musculoskeletal system and connective tissue: Secondary | ICD-10-CM | POA: Diagnosis not present

## 2015-02-01 DIAGNOSIS — Z87891 Personal history of nicotine dependence: Secondary | ICD-10-CM | POA: Insufficient documentation

## 2015-02-01 DIAGNOSIS — I10 Essential (primary) hypertension: Secondary | ICD-10-CM | POA: Diagnosis not present

## 2015-02-01 DIAGNOSIS — Z880 Allergy status to penicillin: Secondary | ICD-10-CM | POA: Diagnosis not present

## 2015-02-01 DIAGNOSIS — E559 Vitamin D deficiency, unspecified: Secondary | ICD-10-CM | POA: Insufficient documentation

## 2015-02-01 DIAGNOSIS — Z8669 Personal history of other diseases of the nervous system and sense organs: Secondary | ICD-10-CM | POA: Diagnosis not present

## 2015-02-01 DIAGNOSIS — R5383 Other fatigue: Secondary | ICD-10-CM | POA: Diagnosis not present

## 2015-02-01 DIAGNOSIS — Z9013 Acquired absence of bilateral breasts and nipples: Secondary | ICD-10-CM | POA: Diagnosis not present

## 2015-02-01 DIAGNOSIS — Z8659 Personal history of other mental and behavioral disorders: Secondary | ICD-10-CM | POA: Diagnosis not present

## 2015-02-01 DIAGNOSIS — Z853 Personal history of malignant neoplasm of breast: Secondary | ICD-10-CM | POA: Diagnosis not present

## 2015-02-01 DIAGNOSIS — R531 Weakness: Secondary | ICD-10-CM | POA: Diagnosis present

## 2015-02-01 DIAGNOSIS — Z86010 Personal history of colonic polyps: Secondary | ICD-10-CM | POA: Insufficient documentation

## 2015-02-01 DIAGNOSIS — R200 Anesthesia of skin: Secondary | ICD-10-CM | POA: Diagnosis not present

## 2015-02-01 DIAGNOSIS — I4891 Unspecified atrial fibrillation: Secondary | ICD-10-CM | POA: Insufficient documentation

## 2015-02-01 DIAGNOSIS — I2510 Atherosclerotic heart disease of native coronary artery without angina pectoris: Secondary | ICD-10-CM | POA: Diagnosis not present

## 2015-02-01 DIAGNOSIS — Z8673 Personal history of transient ischemic attack (TIA), and cerebral infarction without residual deficits: Secondary | ICD-10-CM | POA: Insufficient documentation

## 2015-02-01 LAB — CBC WITH DIFFERENTIAL/PLATELET
Basophils Absolute: 0 10*3/uL (ref 0.0–0.1)
Basophils Relative: 1 % (ref 0–1)
EOS ABS: 0.1 10*3/uL (ref 0.0–0.7)
Eosinophils Relative: 1 % (ref 0–5)
HEMATOCRIT: 37.7 % (ref 36.0–46.0)
Hemoglobin: 11.8 g/dL — ABNORMAL LOW (ref 12.0–15.0)
Lymphocytes Relative: 44 % (ref 12–46)
Lymphs Abs: 1.6 10*3/uL (ref 0.7–4.0)
MCH: 26.6 pg (ref 26.0–34.0)
MCHC: 31.3 g/dL (ref 30.0–36.0)
MCV: 85.1 fL (ref 78.0–100.0)
MONO ABS: 0.3 10*3/uL (ref 0.1–1.0)
MONOS PCT: 8 % (ref 3–12)
Neutro Abs: 1.7 10*3/uL (ref 1.7–7.7)
Neutrophils Relative %: 46 % (ref 43–77)
Platelets: 241 10*3/uL (ref 150–400)
RBC: 4.43 MIL/uL (ref 3.87–5.11)
RDW: 16.7 % — AB (ref 11.5–15.5)
WBC: 3.7 10*3/uL — AB (ref 4.0–10.5)

## 2015-02-01 LAB — COMPREHENSIVE METABOLIC PANEL
ALK PHOS: 47 U/L (ref 38–126)
ALT: 16 U/L (ref 14–54)
AST: 20 U/L (ref 15–41)
Albumin: 3.9 g/dL (ref 3.5–5.0)
Anion gap: 7 (ref 5–15)
BUN: 11 mg/dL (ref 6–20)
CO2: 25 mmol/L (ref 22–32)
Calcium: 9.3 mg/dL (ref 8.9–10.3)
Chloride: 108 mmol/L (ref 101–111)
Creatinine, Ser: 0.6 mg/dL (ref 0.44–1.00)
GFR calc Af Amer: >60 mL/min (ref >60)
GFR calc non Af Amer: >60 mL/min (ref >60)
GLUCOSE: 87 mg/dL (ref 65–99)
Potassium: 3.9 mmol/L (ref 3.5–5.1)
SODIUM: 140 mmol/L (ref 135–145)
TOTAL PROTEIN: 6.4 g/dL — AB (ref 6.5–8.1)
Total Bilirubin: 0.6 mg/dL (ref 0.3–1.2)

## 2015-02-01 LAB — I-STAT TROPONIN, ED: TROPONIN I, POC: 0 ng/mL (ref 0.00–0.08)

## 2015-02-01 LAB — URINALYSIS, ROUTINE W REFLEX MICROSCOPIC
BILIRUBIN URINE: NEGATIVE
Glucose, UA: NEGATIVE mg/dL
Hgb urine dipstick: NEGATIVE
Ketones, ur: NEGATIVE mg/dL
LEUKOCYTES UA: NEGATIVE
NITRITE: NEGATIVE
PH: 7 (ref 5.0–8.0)
Protein, ur: NEGATIVE mg/dL
Specific Gravity, Urine: 1.004 — ABNORMAL LOW (ref 1.005–1.030)
Urobilinogen, UA: 0.2 mg/dL (ref 0.0–1.0)

## 2015-02-01 LAB — T4, FREE: Free T4: 0.9 ng/dL (ref 0.61–1.12)

## 2015-02-01 LAB — BRAIN NATRIURETIC PEPTIDE: B Natriuretic Peptide: 50.2 pg/mL (ref 0.0–100.0)

## 2015-02-01 LAB — TSH: TSH: 1.991 u[IU]/mL (ref 0.350–4.500)

## 2015-02-01 NOTE — Discharge Instructions (Signed)

## 2015-02-01 NOTE — ED Notes (Signed)
Pt c/o weakness x 1.5 months increasing "the last couple weeks" and numbness/heaviness in L leg and L arm x 1 week.  Denies pain.  Pt reports having an extensive medical history and has been seen by several doctors for weakness complaint.  Pt reports her cardiologist has recently decreased some of her medications to see if she was taking too much.  Pt sts she has pinched nerves in her back.

## 2015-02-01 NOTE — ED Provider Notes (Signed)
CSN: [id number]     Arrival date & time 02/01/15  1413 History   First MD Initiated Contact with Patient 02/01/15 1504     Chief Complaint  Patient presents with  . Weakness  . Numbness     (Consider location/radiation/quality/duration/timing/severity/associated sxs/prior Treatment) HPI Alyssa Luna is a 81 year old female past medical history of diverticulosis, GERD, osteoporosis, hyperlipidemia, familial tremor, atrial fibrillation, on Coumadin, TIA, hypertension who presents the ER complaining of fatigue, generalized weakness, left-sided numbness/heaviness in left arm and left leg. Patient has many somatic complaints, she is stating these particular complaints have been occurring over the past month and a half, worse over the past week. Patient states she has had worsening of her mobility around the house over the past week. She states she is still able to ambulate, however has to "hold onto something". Patient reports associated dizziness, which she states has been present for the past 6 months. Patient states her flecainide was recently decreased in attempt to address some of patient's symptoms of fatigue. Patient does also have associated back pain, which she states is chronic, and ongoing for her. Patient reports associated dyspnea on exertion, which she states has been occurring "for a long time now". Patient denies any associated headache, blurred vision, neck pain, fever, chest pain, cough, abdominal pain, nausea, vomiting, decreased appetite, diarrhea, dysuria.  Past Medical History  Diagnosis Date  . Colon polyps   . Diverticulosis of colon   . GERD (gastroesophageal reflux disease)   . Osteoporosis   . TR (tricuspid regurgitation)     Mild  . Vitamin B12 deficiency   . Vitamin D deficiency   . History of breast cancer   . Hyperlipidemia   . Familial tremor   . LBP (low back pain)   . Cataract   . Atrial fibrillation     D Taylor  . TIA (transient ischemic attack)   .  Hemorrhoids   . Breast cancer 1984  . Hypertension   . Dysrhythmia     atrial fib  . Anxiety     has lorazepam on hand for nervousness, pt. reports that she had a break-in to her home on 05/2014  . History of hiatal hernia   . Neuromuscular disorder     essential tremor  . Osteoarthritis     hands & back & knees   . Primary localized osteoarthritis of left knee   . Coronary artery disease   . AF (atrial fibrillation)   . Stroke    Past Surgical History  Procedure Laterality Date  . Breast enhancement surgery    . Cataract extraction    . Total knee arthroplasty  Dec 2011    Right - Dr Noemi Chapel  . Bilateral mastectomy    . Polypectomy    . Colonoscopy    . Vaginal hysterectomy      [facility] BSO  . Oophorectomy      BSO  . Breast surgery      Bilateral mastectomy  . Augmentation mammaplasty    . Mastectomy  1984    bilateral  . Eye surgery      cataracts removed- /w iol  & blepheroplasty  . Joint replacement Right   . Total knee arthroplasty Left 08/27/2014    dr Noemi Chapel  . Total knee arthroplasty Left 08/27/2014    Procedure: LEFT TOTAL KNEE ARTHROPLASTY;  Surgeon: Lorn Junes, MD;  Location: [facility];  Service: Orthopedics;  Laterality: Left;   Family History  Problem Relation Age  of Onset  . Early death Neg Hx   . Stroke Neg Hx   . Breast cancer Mother 59  . Tremor Mother   . Tremor Brother   . Colon cancer Maternal Aunt   . Ovarian cancer Maternal Grandmother    History  Substance Use Topics  . Smoking status: Former Research scientist (life sciences)  . Smokeless tobacco: Never Used  . Alcohol Use: No   OB History    Gravida Para Term Preterm AB TAB SAB Ectopic Multiple Living   0 0 0 0 0 0 0 0       Review of Systems  Constitutional: Positive for fatigue. Negative for fever.  HENT: Negative for trouble swallowing.   Eyes: Negative for visual disturbance.  Respiratory: Positive for shortness of breath.   Cardiovascular: Negative for chest pain.  Gastrointestinal: Negative for  nausea, vomiting and abdominal pain.  Genitourinary: Negative for dysuria.  Musculoskeletal: Negative for neck pain.  Skin: Negative for rash.  Neurological: Positive for weakness. Negative for dizziness and numbness.  Psychiatric/Behavioral: Negative.       Allergies  Pravastatin; Augmentin; Ciprofloxacin; Flagyl; Simvastatin; and Tramadol  Home Medications   Prior to Admission medications   Medication Sig Start Date End Date Taking? Authorizing Provider  carvedilol (COREG) 3.125 MG tablet TAKE 1 TABLET BY MOUTH 2 TIMES DAILY. 01/04/15  Yes Aleksei Plotnikov V, MD  Cholecalciferol 1000 UNITS tablet Take 1,000 Units by mouth daily. D-3   Yes Historical Provider, MD  Cyanocobalamin (VITAMIN B-12 IJ) Inject as directed every 30 (thirty) days.   Yes Historical Provider, MD  docusate sodium (COLACE) 100 MG capsule 1 tab 2 times a day while on narcotics.  STOOL SOFTENER Patient taking differently: Take 100 mg by mouth 2 (two) times daily as needed for mild constipation or moderate constipation. 1 tab 2 times a day while on narcotics.  STOOL SOFTEN 08/28/14  Yes Kirstin Shepperson, PA-C  flecainide (TAMBOCOR) 50 MG tablet Take 1 and 1/2 tablets by  mouth two times daily Patient taking differently: TAKE 50 MG BY MOUTH TWICE DAILY 09/17/14  Yes Evans Lance, MD  hydrocortisone 2.5 % cream Apply 1 application topically 3 (three) times daily as needed (itching).   Yes Historical Provider, MD  warfarin (COUMADIN) 3 MG tablet Take 3-4.5 mg by mouth daily. 64m by mouth MTTHFRISATSUN 4.588mon Wed.   Yes Historical Provider, MD  cetirizine (ZYRTEC) 10 MG tablet Take 1 tablet (10 mg total) by mouth daily. Patient not taking: Reported on 01/25/2015 01/04/15   AlCassandria AngerMD  Dexlansoprazole (DEXILANT) 30 MG capsule Take 1 capsule (30 mg total) by mouth daily. Patient not taking: Reported on 02/01/2015 01/25/15   AlLew Dawes, MD  ondansetron (ZOFRAN) 4 MG tablet Take 1 tablet (4 mg total) by  mouth every 8 (eight) hours as needed for nausea or vomiting. Patient not taking: Reported on 02/01/2015 10/02/14   AlCassandria AngerMD  saccharomyces boulardii (FLORASTOR) 250 MG capsule Take 1 capsule (250 mg total) by mouth 2 (two) times daily. Patient not taking: Reported on 02/01/2015 01/25/15   Aleksei Plotnikov V, MD   BP 114/86 mmHg  Pulse 64  Temp(Src) 98.2 F (36.8 C) (Oral)  Resp 13  SpO2 99% Physical Exam  Constitutional: She is oriented to person, place, and time. She appears well-developed and well-nourished. No distress.  HENT:  Head: Normocephalic and atraumatic.  Mouth/Throat: Oropharynx is clear and moist. No oropharyngeal exudate.  Eyes: Right eye exhibits no discharge. Left  eye exhibits no discharge. No scleral icterus.  Neck: Normal range of motion.  Cardiovascular: Normal rate, regular rhythm and normal heart sounds.   No murmur heard. Pulmonary/Chest: Effort normal and breath sounds normal. No respiratory distress.  Abdominal: Soft. There is no tenderness.  Musculoskeletal: Normal range of motion. She exhibits no edema or tenderness.  Neurological: She is alert and oriented to person, place, and time. She has normal strength. No cranial nerve deficit or sensory deficit. Coordination and gait normal. GCS eye subscore is 4. GCS verbal subscore is 5. GCS motor subscore is 6.  Patient fully alert, answering questions appropriately in full, clear sentences. Cranial nerves II through XII grossly intact. Motor strength 5 out of 5 in all major muscle groups of upper and lower extremities. Distal sensation intact.   Skin: Skin is warm and dry. No rash noted. She is not diaphoretic.  Psychiatric: She has a normal mood and affect.  Nursing note and vitals reviewed.   ED Course  Procedures (including critical care time) Labs Review Labs Reviewed  CBC WITH DIFFERENTIAL/PLATELET - Abnormal; Notable for the following:    WBC 3.7 (*)    Hemoglobin 11.8 (*)    RDW 16.7 (*)     All other components within normal limits  COMPREHENSIVE METABOLIC PANEL - Abnormal; Notable for the following:    Total Protein 6.4 (*)    All other components within normal limits  URINALYSIS, ROUTINE W REFLEX MICROSCOPIC (NOT AT [facility]) - Abnormal; Notable for the following:    Specific Gravity, Urine 1.004 (*)    All other components within normal limits  BRAIN NATRIURETIC PEPTIDE  TSH  T4, FREE  I-STAT TROPOININ, ED    Imaging Review Dg Chest 2 View  02/01/2015   CLINICAL DATA:  Fatigue and weakness  EXAM: CHEST - 2 VIEW  COMPARISON:  08/07/2010, 10/02/2014  FINDINGS: Cardiac shadow is within normal limits. A small hiatal hernia is noted. Bilateral breast implants are seen. Postsurgical changes on the left are noted. The lungs are clear bilaterally. Compression deformities are noted at L1 and T11 stable from prior exam from February of 2016.  IMPRESSION: No acute abnormality noted.   Electronically Signed   By: Inez Catalina M.D.   On: 02/01/2015 16:15   Ct Head Wo Contrast  02/01/2015   CLINICAL DATA:  Worsening weakness for 6 weeks. Numbness and heaviness in the left leg and left arm.  EXAM: CT HEAD WITHOUT CONTRAST  TECHNIQUE: Contiguous axial images were obtained from the base of the skull through the vertex without intravenous contrast.  COMPARISON:  MRI 11/24/2010.  FINDINGS: There is mild age related volume loss. No sign of focal old or acute infarction, mass lesion, hemorrhage, hydrocephalus or extra-axial collection. Sinuses, middle ears and mastoids are clear. No calvarial abnormality.  IMPRESSION: No focal or acute finding.  Mild age related volume loss.   Electronically Signed   By: Nelson Chimes M.D.   On: 02/01/2015 16:16     EKG Interpretation None      MDM   Final diagnoses:  Fatigue    Patients here with symptoms of fatigue. Patient her exam is benign, no deficit noted. Patient ambulatory in the hallway without any difficulties. Patient afebrile, in hemodynamic  stable and in no acute distress. Labs and imaging reviewed, there are no evidence of acute pathology. Patient stable for discharge, will have patient follow-up with her PCP for further evaluation. Return cautions discussed, patient verbalizes understanding and agreement of this plan.  BP 114/86 mmHg  Pulse 64  Temp(Src) 98.2 F (36.8 C) (Oral)  Resp 13  SpO2 99%  Signed,  Dahlia Bailiff, PA-C 1:39 AM  Patient seen and discussed with Dr. Ernestina Patches, M.D.   Dahlia Bailiff, PA-C 02/02/15 0352  Ernestina Patches, MD 02/02/15 [phone]

## 2015-02-01 NOTE — ED Notes (Signed)
Bed: WA24 Expected date:  Expected time:  Means of arrival:  Comments: Hold for triage 

## 2015-02-03 ENCOUNTER — Other Ambulatory Visit: Payer: Self-pay | Admitting: Internal Medicine

## 2015-02-04 ENCOUNTER — Other Ambulatory Visit: Payer: Self-pay

## 2015-02-05 ENCOUNTER — Telehealth: Payer: Self-pay | Admitting: Internal Medicine

## 2015-02-05 NOTE — Telephone Encounter (Signed)
Yes... Pt need to make a ER follow-up to discuss labs...Alyssa Luna

## 2015-02-05 NOTE — Telephone Encounter (Signed)
Patient states she went to ER at [facility] on 17th.  She is requesting Dr. Alain Marion to review the notes.  She would like to know if she needs to come in to speak with him about her thyroid based on the lab work done at the ER.

## 2015-02-05 NOTE — Telephone Encounter (Signed)
Got scheduled  °

## 2015-02-06 ENCOUNTER — Ambulatory Visit (INDEPENDENT_AMBULATORY_CARE_PROVIDER_SITE_OTHER): Payer: Medicare Other | Admitting: Internal Medicine

## 2015-02-06 ENCOUNTER — Encounter: Payer: Self-pay | Admitting: Internal Medicine

## 2015-02-06 VITALS — BP 160/82 | HR 67 | Wt 128.0 lb

## 2015-02-06 DIAGNOSIS — I480 Paroxysmal atrial fibrillation: Secondary | ICD-10-CM

## 2015-02-06 DIAGNOSIS — E538 Deficiency of other specified B group vitamins: Secondary | ICD-10-CM | POA: Diagnosis not present

## 2015-02-06 DIAGNOSIS — R5383 Other fatigue: Secondary | ICD-10-CM

## 2015-02-06 MED ORDER — CYANOCOBALAMIN 1000 MCG/ML IJ SOLN
1000.0000 ug | Freq: Once | INTRAMUSCULAR | Status: AC
  Administered 2015-02-06: 1000 ug via INTRAMUSCULAR

## 2015-02-06 MED ORDER — ESCITALOPRAM OXALATE 5 MG PO TABS: 5.0000 mg | ORAL_TABLET | Freq: Every day | ORAL | Status: DC

## 2015-02-06 NOTE — Progress Notes (Signed)
Pre visit review using our clinic review tool, if applicable. No additional management support is needed unless otherwise documented below in the visit note. 
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
## 2015-02-07 ENCOUNTER — Encounter: Payer: Self-pay | Admitting: Internal Medicine

## 2015-02-07 NOTE — Assessment & Plan Note (Signed)
A very slow progress if any. Tapering off flecanide

## 2015-02-07 NOTE — Assessment & Plan Note (Signed)
No relapse Dr Lovena Le Flecanide - tapering off,  Coreg, Eliquis

## 2015-02-08 ENCOUNTER — Encounter: Payer: Self-pay | Admitting: Internal Medicine

## 2015-02-10 ENCOUNTER — Other Ambulatory Visit: Payer: Self-pay | Admitting: Internal Medicine

## 2015-02-11 ENCOUNTER — Ambulatory Visit (INDEPENDENT_AMBULATORY_CARE_PROVIDER_SITE_OTHER): Payer: Medicare Other | Admitting: General Practice

## 2015-02-11 ENCOUNTER — Other Ambulatory Visit: Payer: Self-pay | Admitting: Surgery

## 2015-02-11 DIAGNOSIS — M545 Low back pain: Secondary | ICD-10-CM

## 2015-02-11 DIAGNOSIS — I4891 Unspecified atrial fibrillation: Secondary | ICD-10-CM

## 2015-02-11 LAB — POCT INR: INR: 1.4

## 2015-02-11 NOTE — Progress Notes (Signed)
Pre visit review using our clinic review tool, if applicable. No additional management support is needed unless otherwise documented below in the visit note. 

## 2015-02-12 ENCOUNTER — Ambulatory Visit: Payer: Medicare Other

## 2015-02-17 ENCOUNTER — Ambulatory Visit
Admission: RE | Admit: 2015-02-17 | Discharge: 2015-02-17 | Disposition: A | Discharge status: Home / Self Care | Payer: Medicare Other | Source: Ambulatory Visit | Attending: Surgery | Admitting: Surgery

## 2015-02-17 DIAGNOSIS — M545 Low back pain: Secondary | ICD-10-CM

## 2015-02-19 ENCOUNTER — Ambulatory Visit: Payer: Medicare Other

## 2015-02-20 ENCOUNTER — Ambulatory Visit (INDEPENDENT_AMBULATORY_CARE_PROVIDER_SITE_OTHER): Payer: Medicare Other | Admitting: General Practice

## 2015-02-20 DIAGNOSIS — I4891 Unspecified atrial fibrillation: Secondary | ICD-10-CM

## 2015-02-20 LAB — POCT INR: INR: 1.4

## 2015-02-20 NOTE — Progress Notes (Signed)
I have reviewed and agree with the plan.

## 2015-02-20 NOTE — Progress Notes (Signed)
Pre visit review using our clinic review tool, if applicable. No additional management support is needed unless otherwise documented below in the visit note. 

## 2015-02-22 ENCOUNTER — Telehealth: Payer: Self-pay | Admitting: *Deleted

## 2015-02-22 ENCOUNTER — Other Ambulatory Visit (HOSPITAL_COMMUNITY): Payer: Self-pay | Admitting: Specialist

## 2015-02-22 ENCOUNTER — Ambulatory Visit: Payer: Medicare Other | Admitting: Internal Medicine

## 2015-02-22 DIAGNOSIS — S32040A Wedge compression fracture of fourth lumbar vertebra, initial encounter for closed fracture: Secondary | ICD-10-CM

## 2015-02-22 NOTE — Telephone Encounter (Signed)
Pt calls today stating she received a call from [place] at [facility] office.  She states she was told to call our office about stopping her Coumadin for 4 days prior to her surgery on 7/14.  I spoke with Inez Catalina at Dr. Otho Ket office & it looks like pt is scheduled for a Kyphoplasty of L1 on  02/28/15 Her coumadin is monitored by pcp, Dr. Alain Marion.  No request for clearance was sent to our office.  The question is:   Can pt be cleared for this procedure & can she stop her Coumadin on Monday 02/25/15?  Pt is aware that her procedure may need to be rescheduled. I will send this to Dr. Lovena Le for review  Inez Catalina, at Dr. Arvil Persons office has left a message also this.  Horton Chin RN

## 2015-02-25 ENCOUNTER — Telehealth: Payer: Self-pay | Admitting: Internal Medicine

## 2015-02-25 NOTE — Telephone Encounter (Signed)
Received call from surgical tech at [facility]; Dr. Lovena Le is in a procedure.  I advised him that we are calling regarding surgical clearance and to please call the office back at his earliest convenience.

## 2015-02-25 NOTE — Telephone Encounter (Signed)
Awaiting call back from Dr. Lovena Le. As noted before, he is in procedures in OR all day. Per patient's last OV, Dr. Lovena Le noted:    She has very mild sinus node dysfunction and is currently asymptomatic. She will undergo watchful waiting.       Attempted to notify April at [facility] office however their office is closed at this time (4:59 pm). Will route to Triage so follow up can be completed tomorrow, 7/12.

## 2015-02-25 NOTE — Telephone Encounter (Signed)
Spoke with patient who is calling for surgical clearance (kyphoplasty) and permission to hold Coumadin for 4 days prior to procedure.  See telephone encounter dated 6/8.  Patient states she is calling to get clearance from Dr. Lovena Le because she has concerns about the Flecainide; states her BP is slightly elevated since decreasing Flecainide dose to 75 mg BID.  Reports systolic BP has been 316'F to 425'L and diastolic 25'G to 94'Q.  She denies symptoms, states she is just cautious because of bad interactions with medications in the past.  I advised patient that Dr. Lovena Le is working in the hospital today and that I will try to send message to him.  She states she will have to reschedule ortho appointment if clearance not received today.  I verbalized understanding and advised her of the plan to reach Dr. Lovena Le in the hospital.  She verbalized understanding and agreement.

## 2015-02-25 NOTE — Telephone Encounter (Signed)
Follow up      Calling because it is getting late and she has not heard from the nurse regarding being cleared for surgery. She may have to cancel it

## 2015-02-25 NOTE — Telephone Encounter (Signed)
Incoming call from April at the Orthopedic office. Updated her that Dr. Lovena Le was paged however an OR Tech called back that he was in procedure and could not speak at this time. He will call back at his earliest convenience. Patient's surgery is scheduled for 7/14. Coumadin needs to be held 4 days prior, so that is why they are actively trying to get clearance today. Will route to Dr. Lovena Le with high priority. Fax number for April/Ortho office is: [phone].

## 2015-02-25 NOTE — Telephone Encounter (Signed)
Message received  Returned called to speak to April at [facility] [phone]  VM left to call back

## 2015-02-25 NOTE — Telephone Encounter (Signed)
New Message  Rn from Dr. Louanne Skye office to follow up on surgical clearance- spoke w/ Horton Chin on Fri 7/8. Please call back and discuss.

## 2015-02-26 ENCOUNTER — Other Ambulatory Visit: Payer: Self-pay | Admitting: General Practice

## 2015-02-26 ENCOUNTER — Telehealth: Payer: Self-pay | Admitting: General Practice

## 2015-02-26 MED ORDER — ENOXAPARIN SODIUM 80 MG/0.8ML ~~LOC~~ SOLN: 80.0000 mg | SUBCUTANEOUS | Status: DC

## 2015-02-26 NOTE — Telephone Encounter (Signed)
Pt has a history of TIA.  Will need Lovenox bridging.  Will send to Villa Herb, RN who manages her INRs.  Will fax to Dr. Arlean Hopping office.

## 2015-02-26 NOTE — Telephone Encounter (Signed)
Instructions for coumadin and Lovenox pre and post procedure. 7/14 - Last dose of coumadin 7/15 - Nothing 7/16 - Lovenox X 1 in AM 7/17 - Lovenox X 1 in AM 7/18 - Lovenox X 1 in AM 7/19 - Procedure - Nothing 7/20 - Lovenox x 1 in am and 2 1/2 tablets of coumadin 7/21 - Lovenox x 1 in am and 2 1/2 tablets of coumadin 7/22 - Lovenox x 1 in am and 2 tablets of coumadin 7/23 - Lovenox x 1 in am and 2 tablets of coumadin.  7/25 - Check INR in clinic at [facility].  Patient verbalized understanding.

## 2015-02-26 NOTE — Telephone Encounter (Signed)
Follow up      Patient is calling to see if Dr Arlean Hopping office called to get clearance for procedure scheduled for next tues the 19th.  Pt needs to have her coumadin held. Procedure has already been rescheduled once

## 2015-02-27 ENCOUNTER — Other Ambulatory Visit: Payer: Self-pay | Admitting: General Practice

## 2015-02-27 ENCOUNTER — Telehealth: Payer: Self-pay | Admitting: General Practice

## 2015-02-27 NOTE — Telephone Encounter (Signed)
-----   Message from Cassandria Anger, MD sent at 02/26/2015 10:46 PM EDT ----- No need for a bridge. Ok to hold coumadin x 5 d Thx ----- Message -----    From: Warden Fillers, RN    Sent: 02/26/2015   1:49 PM      To: Aleksei Plotnikov V, MD  Patient needs to stop coumadin for 5 days for spinal procedure.  Lovenox bridge?  She has a hx of TIA.  Thanks, [employer]

## 2015-02-27 NOTE — Telephone Encounter (Signed)
Cancelled order for Lovenox syringes.

## 2015-02-28 ENCOUNTER — Ambulatory Visit (HOSPITAL_COMMUNITY): Admission: RE | Admit: 2015-02-28 | Payer: Medicare Other | Source: Ambulatory Visit

## 2015-03-01 NOTE — Telephone Encounter (Signed)
In the mornings she feels dizzy and weak and she thinks it's her medications.  She took her Carvedilol and an hour later she felt dizziness.  Yesterday took her Flecainide and she felt weak and dizziness almost immediately

## 2015-03-01 NOTE — Telephone Encounter (Signed)
Pt c/o medication issue:  1. Name of Medication: Flecainide and Carvedilol  2. How are you currently taking this medication (dosage and times per day)? Unknown  3. Are you having a reaction (difficulty breathing--STAT)? Legs are red, dizziness,   4. What is your medication issue?

## 2015-03-02 ENCOUNTER — Other Ambulatory Visit: Payer: Self-pay | Admitting: Radiology

## 2015-03-04 ENCOUNTER — Other Ambulatory Visit: Payer: Self-pay | Admitting: Radiology

## 2015-03-04 ENCOUNTER — Ambulatory Visit (INDEPENDENT_AMBULATORY_CARE_PROVIDER_SITE_OTHER): Payer: Medicare Other

## 2015-03-04 DIAGNOSIS — D649 Anemia, unspecified: Secondary | ICD-10-CM

## 2015-03-04 MED ORDER — CYANOCOBALAMIN 1000 MCG/ML IJ SOLN
1000.0000 ug | Freq: Once | INTRAMUSCULAR | Status: AC
  Administered 2015-03-04: 1000 ug via INTRAMUSCULAR

## 2015-03-05 ENCOUNTER — Ambulatory Visit (HOSPITAL_COMMUNITY)
Admission: RE | Admit: 2015-03-05 | Discharge: 2015-03-05 | Disposition: A | Discharge status: Home / Self Care | Payer: Medicare Other | Source: Ambulatory Visit | Attending: Specialist | Admitting: Specialist

## 2015-03-05 ENCOUNTER — Encounter (HOSPITAL_COMMUNITY): Payer: Self-pay

## 2015-03-05 ENCOUNTER — Other Ambulatory Visit (HOSPITAL_COMMUNITY): Payer: Self-pay | Admitting: Specialist

## 2015-03-05 DIAGNOSIS — Z7901 Long term (current) use of anticoagulants: Secondary | ICD-10-CM | POA: Diagnosis not present

## 2015-03-05 DIAGNOSIS — E785 Hyperlipidemia, unspecified: Secondary | ICD-10-CM | POA: Diagnosis not present

## 2015-03-05 DIAGNOSIS — I10 Essential (primary) hypertension: Secondary | ICD-10-CM | POA: Diagnosis not present

## 2015-03-05 DIAGNOSIS — G250 Essential tremor: Secondary | ICD-10-CM | POA: Insufficient documentation

## 2015-03-05 DIAGNOSIS — I2510 Atherosclerotic heart disease of native coronary artery without angina pectoris: Secondary | ICD-10-CM | POA: Insufficient documentation

## 2015-03-05 DIAGNOSIS — M4856XA Collapsed vertebra, not elsewhere classified, lumbar region, initial encounter for fracture: Secondary | ICD-10-CM | POA: Insufficient documentation

## 2015-03-05 DIAGNOSIS — S32040A Wedge compression fracture of fourth lumbar vertebra, initial encounter for closed fracture: Secondary | ICD-10-CM

## 2015-03-05 DIAGNOSIS — Z853 Personal history of malignant neoplasm of breast: Secondary | ICD-10-CM | POA: Insufficient documentation

## 2015-03-05 DIAGNOSIS — Z79899 Other long term (current) drug therapy: Secondary | ICD-10-CM | POA: Diagnosis not present

## 2015-03-05 DIAGNOSIS — I4891 Unspecified atrial fibrillation: Secondary | ICD-10-CM | POA: Insufficient documentation

## 2015-03-05 DIAGNOSIS — Z87891 Personal history of nicotine dependence: Secondary | ICD-10-CM | POA: Diagnosis not present

## 2015-03-05 DIAGNOSIS — Z8673 Personal history of transient ischemic attack (TIA), and cerebral infarction without residual deficits: Secondary | ICD-10-CM | POA: Diagnosis not present

## 2015-03-05 DIAGNOSIS — M549 Dorsalgia, unspecified: Secondary | ICD-10-CM | POA: Diagnosis present

## 2015-03-05 LAB — BASIC METABOLIC PANEL
Anion gap: 6 (ref 5–15)
BUN: 9 mg/dL (ref 6–20)
CO2: 28 mmol/L (ref 22–32)
Calcium: 9.1 mg/dL (ref 8.9–10.3)
Chloride: 106 mmol/L (ref 101–111)
Creatinine, Ser: 0.69 mg/dL (ref 0.44–1.00)
Glucose, Bld: 88 mg/dL (ref 65–99)
POTASSIUM: 3.9 mmol/L (ref 3.5–5.1)
SODIUM: 140 mmol/L (ref 135–145)

## 2015-03-05 LAB — CBC
HCT: 37.3 % (ref 36.0–46.0)
Hemoglobin: 12.1 g/dL (ref 12.0–15.0)
MCH: 27.9 pg (ref 26.0–34.0)
MCHC: 32.4 g/dL (ref 30.0–36.0)
MCV: 85.9 fL (ref 78.0–100.0)
PLATELETS: 216 10*3/uL (ref 150–400)
RBC: 4.34 MIL/uL (ref 3.87–5.11)
RDW: 14.2 % (ref 11.5–15.5)
WBC: 3.9 10*3/uL — ABNORMAL LOW (ref 4.0–10.5)

## 2015-03-05 LAB — PROTIME-INR
INR: 1.21 (ref 0.00–1.49)
Prothrombin Time: 15.5 seconds — ABNORMAL HIGH (ref 11.6–15.2)

## 2015-03-05 LAB — APTT: APTT: 25 s (ref 24–37)

## 2015-03-05 MED ORDER — VANCOMYCIN HCL IN DEXTROSE 1-5 GM/200ML-% IV SOLN: 1000.0000 mg | Freq: Once | INTRAVENOUS | Status: DC

## 2015-03-05 MED ORDER — MIDAZOLAM HCL 2 MG/2ML IJ SOLN
INTRAMUSCULAR | Status: AC
  Filled 2015-03-05: qty 4

## 2015-03-05 MED ORDER — FENTANYL CITRATE (PF) 100 MCG/2ML IJ SOLN
INTRAMUSCULAR | Status: AC | PRN
  Administered 2015-03-05 (×2): 25 ug via INTRAVENOUS

## 2015-03-05 MED ORDER — BUPIVACAINE HCL (PF) 0.25 % IJ SOLN
INTRAMUSCULAR | Status: AC
  Filled 2015-03-05: qty 30

## 2015-03-05 MED ORDER — TOBRAMYCIN SULFATE 1.2 G IJ SOLR
INTRAMUSCULAR | Status: AC
  Filled 2015-03-05: qty 1.2

## 2015-03-05 MED ORDER — MIDAZOLAM HCL 2 MG/2ML IJ SOLN
INTRAMUSCULAR | Status: AC | PRN
  Administered 2015-03-05 (×2): 1 mg via INTRAVENOUS

## 2015-03-05 MED ORDER — SODIUM CHLORIDE 0.9 % IV SOLN: INTRAVENOUS | Status: AC

## 2015-03-05 MED ORDER — SODIUM CHLORIDE 0.9 % IV SOLN: Freq: Once | INTRAVENOUS | Status: DC

## 2015-03-05 MED ORDER — IOHEXOL 300 MG/ML  SOLN
50.0000 mL | Freq: Once | INTRAMUSCULAR | Status: AC | PRN
  Administered 2015-03-05: 3 mL

## 2015-03-05 MED ORDER — FENTANYL CITRATE (PF) 100 MCG/2ML IJ SOLN
INTRAMUSCULAR | Status: AC
  Filled 2015-03-05: qty 4

## 2015-03-05 NOTE — Procedures (Signed)
S/P L1 VP

## 2015-03-05 NOTE — Sedation Documentation (Signed)
Patient denies pain and is resting comfortably.  

## 2015-03-05 NOTE — H&P (Signed)
Chief Complaint: Patient was seen in consultation today for back pain at the request of Nitka,James E  Referring Physician(s): Nitka,James E  History of Present Illness: Alyssa Luna is a 81 y.o. female   Pt has had back pain since post Rt knee surgery 08/2014 Denies injury Worsening in last few months; now even more noticeable last 5 days Pain almost exclusively while standing Ranks it "10" on scale of 10 While lying down pain almost gone Has tried Neurontin with little relief MRI reveals: IMPRESSION: 1. Acute to subacute fracture of the L1 vertebral body with approximately 70% height loss and 6 mm of bony retropulsion. There is secondary mild canal stenosis without cord impingement. 2. Chronic anterior wedging deformity of the T11 vertebral body. 3. Right foraminal disc protrusion at L4-5, closely approximating the exiting right L4 nerve root as it courses out of the right neural foramen. Superimposed prominent right-sided facet arthrosis at this level results in moderate right foraminal stenosis. 4. Dextroscoliosis with multilevel facet arthropathy as detailed above. Facet disease is most severe at L5-S1 on the right.  Has been referred to Dr Estanislado Pandy for evaluation and possible treatment I have seen and examined pt Dr Estanislado Pandy has reviewed imaging and approves kyphoplasty/vertebroplasty procedure at [facility] Now scheduled for same  Pt has stopped coumadin: LD 7/14   Past Medical History  Diagnosis Date  . Colon polyps   . Diverticulosis of colon   . GERD (gastroesophageal reflux disease)   . Osteoporosis   . TR (tricuspid regurgitation)     Mild  . Vitamin B12 deficiency   . Vitamin D deficiency   . History of breast cancer   . Hyperlipidemia   . Familial tremor   . LBP (low back pain)   . Cataract   . Atrial fibrillation     D [place]  . TIA (transient ischemic attack)   . Hemorrhoids   . Breast cancer 1984  . Hypertension   . Dysrhythmia    atrial fib  . Anxiety     has lorazepam on hand for nervousness, pt. reports that she had a break-in to her home on 05/2014  . History of hiatal hernia   . Neuromuscular disorder     essential tremor  . Osteoarthritis     hands & back & knees   . Primary localized osteoarthritis of left knee   . Coronary artery disease   . AF (atrial fibrillation)   . Stroke     Past Surgical History  Procedure Laterality Date  . Breast enhancement surgery    . Cataract extraction    . Total knee arthroplasty  Dec 2011    Right - Dr Noemi Chapel  . Bilateral mastectomy    . Polypectomy    . Colonoscopy    . Vaginal hysterectomy      [facility] BSO  . Oophorectomy      BSO  . Breast surgery      Bilateral mastectomy  . Augmentation mammaplasty    . Mastectomy  1984    bilateral  . Eye surgery      cataracts removed- /w iol  & blepheroplasty  . Joint replacement Right   . Total knee arthroplasty Left 08/27/2014    dr Noemi Chapel  . Total knee arthroplasty Left 08/27/2014    Procedure: LEFT TOTAL KNEE ARTHROPLASTY;  Surgeon: Lorn Junes, MD;  Location: [facility];  Service: Orthopedics;  Laterality: Left;    Allergies: Pravastatin; Augmentin; Ciprofloxacin; Flagyl; Gold-containing  drug products; Lexapro; Simvastatin; and Tramadol  Medications: Prior to Admission medications   Medication Sig Start Date End Date Taking? Authorizing Provider  carvedilol (COREG) 3.125 MG tablet TAKE 1 TABLET BY MOUTH 2 TIMES DAILY. 01/04/15  Yes Aleksei Plotnikov V, MD  cetirizine (ZYRTEC) 10 MG tablet Take 1 tablet (10 mg total) by mouth daily. Patient taking differently: Take 10 mg by mouth daily as needed for allergies.  01/04/15  Yes Aleksei Plotnikov V, MD  Cyanocobalamin (VITAMIN B-12 IJ) Inject as directed every 30 (thirty) days.   Yes Historical Provider, MD  Dexlansoprazole (DEXILANT) 30 MG capsule Take 1 capsule (30 mg total) by mouth daily. 01/25/15  Yes Aleksei Plotnikov V, MD  docusate sodium (COLACE) 100 MG  capsule 1 tab 2 times a day while on narcotics.  STOOL SOFTENER Patient taking differently: Take 100 mg by mouth 2 (two) times daily as needed for mild constipation or moderate constipation. 1 tab 2 times a day while on narcotics.  STOOL SOFTEN 08/28/14  Yes Kirstin Shepperson, PA-C  flecainide (TAMBOCOR) 50 MG tablet Take 1 and 1/2 tablets by  mouth two times daily Patient taking differently: TAKE 50 MG BY MOUTH TWICE DAILY 09/17/14  Yes Evans Lance, MD  gabapentin (NEURONTIN) 100 MG capsule Take 100 mg by mouth at bedtime.   Yes Historical Provider, MD  hydrocortisone 2.5 % cream Apply 1 application topically 3 (three) times daily as needed (itching).   Yes Historical Provider, MD  ondansetron (ZOFRAN) 4 MG tablet Take 1 tablet (4 mg total) by mouth every 8 (eight) hours as needed for nausea or vomiting. 10/02/14  Yes Aleksei Plotnikov V, MD  warfarin (COUMADIN) 3 MG tablet Take 3-4.5 mg by mouth daily. 71m by mouth MTTHFRISATSUN 4.551mon Wed.   Yes Historical Provider, MD  Cholecalciferol 1000 UNITS tablet Take 1,000 Units by mouth daily. D-3    Historical Provider, MD     Family History  Problem Relation Age of Onset  . Early death Neg Hx   . Stroke Neg Hx   . Breast cancer Mother [id number]. Tremor Mother   . Tremor Brother   . Colon cancer Maternal Aunt   . Ovarian cancer Maternal Grandmother     History   Social History  . Marital Status: Single    Spouse Name: N/A  . Number of Children: N/A  . Years of Education: N/A   Occupational History  . [employer]ama[employer]ired    Social History Main Topics  . Smoking status: Former SmResearch scientist (life sciences). Smokeless tobacco: Never Used  . Alcohol Use: No  . Drug Use: No  . Sexual Activity: No     Comment: HYST   Other Topics Concern  . None   Social History Narrative   ** Merged History Encounter **       Regular Exercise -  NO       Review of Systems: A 12 point ROS discussed and pertinent positives are indicated in the HPI above.  All other  systems are negative.  Review of Systems  Constitutional: Positive for activity change. Negative for fatigue.  Respiratory: Negative for shortness of breath.   Gastrointestinal: Negative for abdominal pain.  Musculoskeletal: Positive for back pain.  Neurological: Positive for weakness.  Psychiatric/Behavioral: Negative for behavioral problems and confusion.    Vital Signs: BP 154/63 mmHg  Pulse 58  Temp(Src) 97.6 F (36.4 C) (Oral)  Resp 18  Ht 5' 2"  (1.575 m)  Wt 124 lb (56.246  kg)  BMI 22.67 kg/m2  SpO2 100%  Physical Exam  Constitutional: She is oriented to person, place, and time. She appears well-nourished.  Cardiovascular: Normal rate, regular rhythm and normal heart sounds.   No murmur heard. Pulmonary/Chest: Effort normal and breath sounds normal. She has no wheezes.  Abdominal: Soft. Bowel sounds are normal. There is no tenderness.  Musculoskeletal: Normal range of motion. She exhibits tenderness.  Low back pain  Neurological: She is alert and oriented to person, place, and time.  Skin: Skin is warm and dry.  Psychiatric: She has a normal mood and affect. Her behavior is normal. Judgment and thought content normal.  Nursing note and vitals reviewed.   Mallampati Score:  MD Evaluation Airway: WNL Heart: WNL Abdomen: WNL Chest/ Lungs: WNL ASA  Classification: 3 Mallampati/Airway Score: One  Imaging: Mr Lumbar Spine Wo Contrast  02/17/2015   CLINICAL DATA:  Initial evaluation for chronic low back pain and weakness. Recent worsening.  EXAM: MRI LUMBAR SPINE WITHOUT CONTRAST  TECHNIQUE: Multiplanar, multisequence MR imaging of the lumbar spine was performed. No intravenous contrast was administered.  COMPARISON:  Prior MRI from 03/06/2009.  FINDINGS: Right convex scoliosis of the lumbar spine with apex at L2-3 is seen. Vertebral bodies are otherwise normally aligned with preservation of the normal lumbar lordosis.  Chronic anterior wedging deformity of the T11  vertebral body present without bony retropulsion. No marrow edema to suggest acute fracture at this level.  There is an additional compression fracture of the L1 vertebral body with approximately 70% of height loss and 6 mm of bony retropulsion. Associated T2/STIR signal intensity is compatible with marrow edema, suggesting at this is acute to subacute in nature. No other fracture.  Otherwise, signal intensity within the vertebral body bone marrow is is somewhat patchy heterogeneous in appearance but within normal limits. No focal osseous lesion.  Conus medullaris terminates normally at the L2 level. Signal intensity within the visualized cord is normal.  Fatty atrophy noted within the lower paraspinous soft tissues. T2 hyperintense cysts noted within the left kidney. Visualized aorta of normal caliber.  T10-11: Disc desiccation with minimal disc bulge. No significant stenosis.  T11-12: Disc desiccation without significant disc bulge. No significant stenosis.  T12-L1: Degenerative disc desiccation with intervertebral disc space narrowing. There is a left paracentral disc protrusion, which along with the retro post L1 vertebral body indents the left ventral thecal sac with mild flattening of the left hemi cord. No cord signal changes. Superimposed mild facet and ligamentous hypertrophy. There is resultant mild canal narrowing. Foramina remain widely patent.  L1-2: Disc desiccation with mild diffuse disc bulge. Mild facet hypertrophy. No focal disc herniation no significant canal or right foraminal narrowing. Mild left foraminal stenosis related to facet disease.  L2-3: Mild diffuse disc bulge with disc desiccation and intervertebral disc space narrowing. Superimposed facet and ligamentous hypertrophy. Fluid present within the bilateral L2-3 facets. Mild bilateral lateral recess stenosis related to facet disease. There is mild bilateral foraminal narrowing.  L3-4: Diffuse disc bulge with disc desiccation and  intervertebral disc space narrowing. Small annular fissure posteriorly. No focal disc herniation. Mild to moderate bilateral facet arthrosis. Mild canal and lateral recess stenosis bilaterally, largely due to facet disease. There is mild to moderate left with mild right foraminal narrowing due to facet disease and disc bulge.  L4-5: Disc bulge with disc desiccation and intervertebral disc space narrowing. Small annular fissure posteriorly. There is a superimposed right foraminal disc protrusion, closely approximating the exiting right  L4 nerve root as it courses out of the right lateral recess. No frank neural impingement. Right greater than left facet arthrosis and ligamentous hypertrophy, encroaches upon the right lateral recess. No significant canal stenosis. Moderate right foraminal narrowing. Left neural foramen is widely patent.  L5-S1: Disc bulge with disc desiccation. No focal disc herniation. Severe right facet arthropathy. No significant canal stenosis. Moderate right foraminal stenosis related to facet disease and mild disc bulge. Left neural foramen is widely patent.  IMPRESSION: 1. Acute to subacute fracture of the L1 vertebral body with approximately 70% height loss and 6 mm of bony retropulsion. There is secondary mild canal stenosis without cord impingement. 2. Chronic anterior wedging deformity of the T11 vertebral body. 3. Right foraminal disc protrusion at L4-5, closely approximating the exiting right L4 nerve root as it courses out of the right neural foramen. Superimposed prominent right-sided facet arthrosis at this level results in moderate right foraminal stenosis. 4. Dextroscoliosis with multilevel facet arthropathy as detailed above. Facet disease is most severe at L5-S1 on the right.   Electronically Signed   By: Jeannine Boga M.D.   On: 02/17/2015 17:56    Labs:  CBC:  Recent Labs  12/05/14 0730 01/07/15 1122 02/01/15 1630 03/05/15 1015  WBC 3.1* 4.6 3.7* 3.9*  HGB  12.7 13.4 11.8* 12.1  HCT 38.6 40.8 37.7 37.3  PLT 279.0 270.0 241 216    COAGS:  Recent Labs  08/16/14 1416 08/27/14 0606  01/28/15 02/11/15 02/20/15 03/05/15 1015  INR 2.02* 1.19  < > 1.6 1.4 1.4 1.21  APTT 36 32  --   --   --   --  25  < > = values in this interval not displayed.  BMP:  Recent Labs  08/28/14 0615  10/05/14 2100  12/05/14 0730 01/02/15 0736 02/01/15 1630 03/05/15 1015  NA 131*  < > 135  < > 139 139 140 140  K 4.3  < > 3.8  < > 3.7 3.9 3.9 3.9  CL 101  < > 102  < > 104 104 108 106  CO2 25  < > 24  < > 30 30 25 28   GLUCOSE 171*  < > 97  < > 82 86 87 88  BUN 8  < > 6  < > 10 12 11 9   CALCIUM 7.8*  < > 9.4  < > 9.5 9.6 9.3 9.1  CREATININE 0.73  < > 0.69  < > 0.65 0.77 0.60 0.69  GFRNONAA 80*  --  81*  --   --   --  >60 >60  GFRAA >90  --  >90  --   --   --  >60 >60  < > = values in this interval not displayed.  LIVER FUNCTION TESTS:  Recent Labs  10/30/14 1143 12/05/14 0730 01/02/15 0736 02/01/15 1630  BILITOT 0.6 0.5 0.6 0.6  AST 19 18 18 20   ALT 10 11 12 16   ALKPHOS 64 60 51 47  PROT 6.7 6.7 6.5 6.4*  ALBUMIN 4.2 4.0 3.9 3.9    TUMOR MARKERS: No results for input(s): AFPTM, CEA, CA199, CHROMGRNA in the last 8760 hours.  Assessment and Plan:  Back pain L1 acute fx on MRI Now scheduled for L1 VP/KP Risks and Benefits discussed with the patient including, but not limited to education regarding the natural healing process of compression fractures without intervention, bleeding, infection, cement migration which may cause spinal cord damage, paralysis, pulmonary embolism or even death.  All of the patient's questions were answered, patient is agreeable to proceed. Consent signed and in chart.    Thank you for this interesting consult.  I greatly enjoyed meeting LEMYA GREENWELL and look forward to participating in their care.  A copy of this report was sent to the requesting provider on this date.  Signed: Neeta Storey A 03/05/2015,  11:35 AM   I spent a total of  30 Minutes   in face to face in clinical consultation, greater than 50% of which was counseling/coordinating care for L1 VP/KP

## 2015-03-05 NOTE — Discharge Instructions (Signed)
1.No stooping,bending or lifting more than 10 lbs for 2 weeks. 2.Use walker to ambulate  For 2 weeks. 3.RTC in 2 weeks.  KYPHOPLASTY/VERTEBROPLASTY DISCHARGE INSTRUCTIONS  Medications: (check all that apply)     Resume all home medications as before procedure.       Resume your (aspirin/Plavix/Coumadin) on 03/06/15 as directed.                  Continue your pain medications as prescribed as needed.  Over the next 3-5 days, decrease your pain medication as tolerated.  Over the counter medications (i.e. Tylenol, ibuprofen, and aleve) may be substituted once severe/moderate pain symptoms have subsided.   Wound Care: - Bandages may be removed the day following your procedure.  You may get your incision wet once bandages are removed.  Bandaids may be used to cover the incisions until scab formation.  Topical ointments are optional.  - If you develop a fever greater than 101 degrees, have increased skin redness at the incision sites or pus-like oozing from incisions occurring within 1 week of the procedure, contact radiology at [phone] or [phone].  - Ice pack to back for 15-20 minutes 2-3 time per day for first 2-3 days post procedure.  The ice will expedite muscle healing and help with the pain from the incisions.   Activity: - Bedrest today with limited activity for 24 hours post procedure.  - No driving for 48 hours.  - Increase your activity as tolerated after bedrest (with assistance if necessary).  - Refrain from any strenuous activity or heavy lifting (greater than 10 lbs.).   Follow up: - Contact radiology at [phone] or [phone] if any questions/concerns.  - A physician assistant from radiology will contact you in approximately 1 week.  - If a biopsy was performed at the time of your procedure, your referring physician should receive the results in usually 2-3 days.

## 2015-03-06 ENCOUNTER — Ambulatory Visit: Payer: Medicare Other

## 2015-03-07 ENCOUNTER — Other Ambulatory Visit (HOSPITAL_COMMUNITY): Payer: Self-pay | Admitting: Interventional Radiology

## 2015-03-07 DIAGNOSIS — M549 Dorsalgia, unspecified: Secondary | ICD-10-CM

## 2015-03-07 DIAGNOSIS — IMO0002 Reserved for concepts with insufficient information to code with codable children: Secondary | ICD-10-CM

## 2015-03-07 NOTE — Telephone Encounter (Signed)
She is going to adjust her medications so she is not taking them together and let me know if that helps.    I spoke her, her procedure was on Tues and the vertebra was cemented.  Her biggest complaint is her legs and them turning red and burning off and now.  She is questioning if it's the medications.  She is doing better with the lite headed feeling as she is taking her medications different.  She will discuss with her PCP next week.

## 2015-03-11 ENCOUNTER — Ambulatory Visit (INDEPENDENT_AMBULATORY_CARE_PROVIDER_SITE_OTHER): Payer: Medicare Other | Admitting: General Practice

## 2015-03-11 DIAGNOSIS — I4891 Unspecified atrial fibrillation: Secondary | ICD-10-CM

## 2015-03-11 LAB — POCT INR: INR: 1.8

## 2015-03-11 NOTE — Progress Notes (Signed)
Agree with plan 

## 2015-03-11 NOTE — Progress Notes (Signed)
Pre visit review using our clinic review tool, if applicable. No additional management support is needed unless otherwise documented below in the visit note. 

## 2015-03-15 ENCOUNTER — Encounter: Payer: Self-pay | Admitting: Internal Medicine

## 2015-03-15 ENCOUNTER — Ambulatory Visit (INDEPENDENT_AMBULATORY_CARE_PROVIDER_SITE_OTHER): Payer: Medicare Other | Admitting: Internal Medicine

## 2015-03-15 VITALS — BP 158/80 | HR 67 | Wt 127.0 lb

## 2015-03-15 DIAGNOSIS — R42 Dizziness and giddiness: Secondary | ICD-10-CM

## 2015-03-15 DIAGNOSIS — E559 Vitamin D deficiency, unspecified: Secondary | ICD-10-CM | POA: Diagnosis not present

## 2015-03-15 DIAGNOSIS — I480 Paroxysmal atrial fibrillation: Secondary | ICD-10-CM | POA: Diagnosis not present

## 2015-03-15 DIAGNOSIS — M5440 Lumbago with sciatica, unspecified side: Secondary | ICD-10-CM | POA: Diagnosis not present

## 2015-03-15 DIAGNOSIS — E538 Deficiency of other specified B group vitamins: Secondary | ICD-10-CM | POA: Diagnosis not present

## 2015-03-15 MED ORDER — LOSARTAN POTASSIUM 50 MG PO TABS: 25.0000 mg | ORAL_TABLET | Freq: Every day | ORAL | Status: DC

## 2015-03-15 MED ORDER — DIAZEPAM 2 MG PO TABS: 2.0000 mg | ORAL_TABLET | Freq: Three times a day (TID) | ORAL | Status: DC | PRN

## 2015-03-15 MED ORDER — CYANOCOBALAMIN 1000 MCG/ML IJ SOLN
1000.0000 ug | Freq: Once | INTRAMUSCULAR | Status: AC
  Administered 2015-03-15: 1000 ug via INTRAMUSCULAR

## 2015-03-15 NOTE — Assessment & Plan Note (Signed)
Re-start Vit D 

## 2015-03-15 NOTE — Assessment & Plan Note (Signed)
Diazepam prn  Potential benefits of a long term benzodiazepines  use as well as potential risks  and complications were explained to the patient and were aknowledged.

## 2015-03-15 NOTE — Progress Notes (Signed)
Pre visit review using our clinic review tool, if applicable. No additional management support is needed unless otherwise documented below in the visit note. 

## 2015-03-15 NOTE — Assessment & Plan Note (Addendum)
In NSR On Flecanide, Coreg, Eliquis

## 2015-03-15 NOTE — Assessment & Plan Note (Signed)
S/p vertebroplasty

## 2015-03-15 NOTE — Progress Notes (Signed)
Subjective:  Patient ID: Alyssa Luna, female    DOB: 1933-10-15  Age: 81 y.o. MRN: [id number]  CC: No chief complaint on file.   HPI JAVON HUPFER presents for LBP (recent procedure did not help). C/o loose stools x4 a day this week. C/o lightheadedness. C/o SBP being up  Outpatient Prescriptions Prior to Visit  Medication Sig Dispense Refill  . carvedilol (COREG) 3.125 MG tablet TAKE 1 TABLET BY MOUTH 2 TIMES DAILY. 180 tablet 0  . cetirizine (ZYRTEC) 10 MG tablet Take 1 tablet (10 mg total) by mouth daily. (Patient taking differently: Take 10 mg by mouth daily as needed for allergies. ) 100 tablet 2  . Cholecalciferol 1000 UNITS tablet Take 1,000 Units by mouth daily. D-3    . Cyanocobalamin (VITAMIN B-12 IJ) Inject as directed every 30 (thirty) days.    [name] Dexlansoprazole (DEXILANT) 30 MG capsule Take 1 capsule (30 mg total) by mouth daily. 30 capsule 11  . flecainide (TAMBOCOR) 50 MG tablet Take 1 and 1/2 tablets by  mouth two times daily (Patient taking differently: TAKE 50 MG BY MOUTH TWICE DAILY) 270 tablet 2  . gabapentin (NEURONTIN) 100 MG capsule Take 100 mg by mouth at bedtime.    . hydrocortisone 2.5 % cream Apply 1 application topically 3 (three) times daily as needed (itching).    . warfarin (COUMADIN) 3 MG tablet Take 3-4.5 mg by mouth daily. 66m by mouth MTTHFRISATSUN 4.550mon Wed.    . Marland Kitchenocusate sodium (COLACE) 100 MG capsule 1 tab 2 times a day while on narcotics.  STOOL SOFTENER (Patient not taking: Reported on 03/15/2015) 60 capsule 0  . ondansetron (ZOFRAN) 4 MG tablet Take 1 tablet (4 mg total) by mouth every 8 (eight) hours as needed for nausea or vomiting. (Patient not taking: Reported on 03/15/2015) 30 tablet 0   No facility-administered medications prior to visit.    ROS Review of Systems  Constitutional: Positive for fatigue. Negative for fever, chills, activity change, appetite change and unexpected weight change.  HENT: Negative for congestion, mouth  sores, rhinorrhea, sinus pressure and trouble swallowing.   Eyes: Negative for visual disturbance.  Respiratory: Negative for cough, chest tightness and shortness of breath.   Cardiovascular: Negative for palpitations and leg swelling.  Gastrointestinal: Negative for nausea, abdominal pain, diarrhea and blood in stool.  Genitourinary: Negative for frequency, difficulty urinating and vaginal pain.  Musculoskeletal: Positive for back pain. Negative for joint swelling, gait problem and neck pain.  Skin: Negative for pallor and rash.  Neurological: Positive for dizziness and light-headedness. Negative for tremors, syncope, weakness, numbness and headaches.  Psychiatric/Behavioral: Negative for confusion, sleep disturbance and decreased concentration. The patient is nervous/anxious.     Objective:  BP 158/80 mmHg  Pulse 67  Wt 127 lb (57.607 kg)  SpO2 99%  BP Readings from Last 3 Encounters:  03/15/15 158/80  03/05/15 116/48  02/06/15 160/82    Wt Readings from Last 3 Encounters:  03/15/15 127 lb (57.607 kg)  03/05/15 124 lb (56.246 kg)  02/06/15 128 lb (58.06 kg)    Physical Exam  Constitutional: She appears well-developed. No distress.  HENT:  Head: Normocephalic.  Right Ear: External ear normal.  Left Ear: External ear normal.  Nose: Nose normal.  Mouth/Throat: Oropharynx is clear and moist.  Eyes: Conjunctivae are normal. Pupils are equal, round, and reactive to light. Right eye exhibits no discharge. Left eye exhibits no discharge.  Neck: Normal range of motion. Neck supple. No JVD  present. No tracheal deviation present. No thyromegaly present.  Cardiovascular: Normal rate, regular rhythm and normal heart sounds.   Pulmonary/Chest: No stridor. No respiratory distress. She has no wheezes.  Abdominal: Soft. Bowel sounds are normal. She exhibits no distension and no mass. There is no tenderness. There is no rebound and no guarding.  Musculoskeletal: She exhibits tenderness.  She exhibits no edema.  Lymphadenopathy:    She has no cervical adenopathy.  Neurological: She displays normal reflexes. No cranial nerve deficit. She exhibits normal muscle tone. Coordination abnormal.  Skin: No rash noted. No erythema.  Psychiatric: Her behavior is normal. Judgment and thought content normal.    Lab Results  Component Value Date   WBC 3.9* 03/05/2015   HGB 12.1 03/05/2015   HCT 37.3 03/05/2015   PLT 216 03/05/2015   GLUCOSE 88 03/05/2015   CHOL 189 10/30/2014   TRIG 151.0* 10/30/2014   HDL 41.30 10/30/2014   LDLDIRECT 178.0 09/24/2011   LDLCALC 118* 10/30/2014   ALT 16 02/01/2015   AST 20 02/01/2015   NA 140 03/05/2015   K 3.9 03/05/2015   CL 106 03/05/2015   CREATININE 0.69 03/05/2015   BUN 9 03/05/2015   CO2 28 03/05/2015   TSH 1.991 02/01/2015   INR 1.8 03/11/2015    Ir Vertebroplasty Lumobsacral Inj  03/08/2015   CLINICAL DATA:  Patient with severely painful compression fracture at L1.  EXAM: IR VERTEBROPLASTY LUMBOSACRAL INJ  MEDICATIONS: Versed 2 mg IV, Fentanyl 50 mcg IV.  ANESTHESIA/SEDATION: Total Moderate Sedation Time:  12 minutes.  FLUOROSCOPY TIME:  7.8 minutes  PROCEDURE: Following a full explanation of the procedure along with the potentially associated complications, a witnessed informed consent was obtained.  The patient was placed prone on the fluoroscopic table. Nasal oxygen was administered. Physiologic monitoring was performed throughout the duration of the procedure. The skin overlying the thoracolumbar region was prepped and draped in the usual sterile fashion. The L1 vertebral body was identified and the right pedicle was infiltrated with 0.25% Bupivacaine. This was then followed by the advancement of a 13-gauge Cook needle through the right pedicle into the anterior one-third at L1. A gentle contrast injection demonstrated a trabecular pattern of contrast.  At this time, methylmethacrylate mixture was reconstituted. Under biplane  intermittent fluoroscopy, the methylmethacrylate was then injected into the L1 vertebral body with filling of the vertebral body.  No extravasation was noted into the disk spaces or posteriorly into the spinal canal. No epidural venous contamination was seen.  The needle was then removed. Hemostasis was achieved at the skin entry site.  There were no acute complications. Patient tolerated the procedure well. The patient was observed for 3 hours and discharged in good condition.  IMPRESSION: Status post vertebral body augmentation for painful compression fracture at L1 using vertebroplasty technique.   Electronically Signed   By: Luanne Bras M.D.   On: 03/05/2015 13:24    Assessment & Plan:   There are no diagnoses linked to this encounter. I am having Ms. Steinmiller maintain her Cholecalciferol, docusate sodium, flecainide, ondansetron, Cyanocobalamin (VITAMIN B-12 IJ), carvedilol, cetirizine, warfarin, Dexlansoprazole, hydrocortisone, and gabapentin.  No orders of the defined types were placed in this encounter.     Follow-up: No Follow-up on file.  Walker Kehr, MD

## 2015-03-19 ENCOUNTER — Ambulatory Visit (HOSPITAL_COMMUNITY)
Admission: RE | Admit: 2015-03-19 | Discharge: 2015-03-19 | Disposition: A | Discharge status: Home / Self Care | Payer: Medicare Other | Source: Ambulatory Visit | Attending: Interventional Radiology | Admitting: Interventional Radiology

## 2015-03-19 DIAGNOSIS — M549 Dorsalgia, unspecified: Secondary | ICD-10-CM

## 2015-03-19 DIAGNOSIS — IMO0002 Reserved for concepts with insufficient information to code with codable children: Secondary | ICD-10-CM

## 2015-03-20 ENCOUNTER — Ambulatory Visit: Payer: Medicare Other | Admitting: Internal Medicine

## 2015-03-21 ENCOUNTER — Ambulatory Visit: Payer: Medicare Other | Admitting: Internal Medicine

## 2015-03-25 ENCOUNTER — Ambulatory Visit: Payer: Medicare Other

## 2015-03-27 ENCOUNTER — Ambulatory Visit (INDEPENDENT_AMBULATORY_CARE_PROVIDER_SITE_OTHER): Payer: Medicare Other | Admitting: General Practice

## 2015-03-27 DIAGNOSIS — I4891 Unspecified atrial fibrillation: Secondary | ICD-10-CM | POA: Diagnosis not present

## 2015-03-27 LAB — POCT INR: INR: 1.4

## 2015-03-27 NOTE — Progress Notes (Signed)
I have reviewed and agree with the plan.

## 2015-03-27 NOTE — Progress Notes (Signed)
Pre visit review using our clinic review tool, if applicable. No additional management support is needed unless otherwise documented below in the visit note. 

## 2015-04-08 ENCOUNTER — Telehealth: Payer: Self-pay | Admitting: Internal Medicine

## 2015-04-08 ENCOUNTER — Other Ambulatory Visit (INDEPENDENT_AMBULATORY_CARE_PROVIDER_SITE_OTHER): Payer: Medicare Other

## 2015-04-08 ENCOUNTER — Encounter: Payer: Self-pay | Admitting: Diagnostic Neuroimaging

## 2015-04-08 ENCOUNTER — Ambulatory Visit (INDEPENDENT_AMBULATORY_CARE_PROVIDER_SITE_OTHER): Payer: Medicare Other | Admitting: Diagnostic Neuroimaging

## 2015-04-08 VITALS — BP 132/68 | HR 53 | Ht 62.0 in | Wt 131.0 lb

## 2015-04-08 DIAGNOSIS — G250 Essential tremor: Secondary | ICD-10-CM | POA: Diagnosis not present

## 2015-04-08 DIAGNOSIS — R300 Dysuria: Secondary | ICD-10-CM

## 2015-04-08 LAB — URINALYSIS, ROUTINE W REFLEX MICROSCOPIC
Bilirubin Urine: NEGATIVE
HGB URINE DIPSTICK: NEGATIVE
Ketones, ur: NEGATIVE
Leukocytes, UA: NEGATIVE
NITRITE: NEGATIVE
RBC / HPF: NONE SEEN (ref 0–?)
Specific Gravity, Urine: 1.01 (ref 1.000–1.030)
Total Protein, Urine: NEGATIVE
Urine Glucose: NEGATIVE
Urobilinogen, UA: 0.2 (ref 0.0–1.0)
WBC, UA: NONE SEEN (ref 0–?)
pH: 7 (ref 5.0–8.0)

## 2015-04-08 NOTE — Patient Instructions (Signed)
Monitor symptoms. 

## 2015-04-08 NOTE — Telephone Encounter (Signed)
Pt experiencing uti symptoms and is wondering if she can come in for labs She has had 3 injections done by Dr. Ernestina Patches for back problems and wondering if this could be a side effect  Please advise Best # to call [phone]

## 2015-04-08 NOTE — Telephone Encounter (Signed)
Not sure. UA is nl Thx

## 2015-04-08 NOTE — Progress Notes (Signed)
[facility]  PATIENT: Alyssa Luna DOB: [birth year]  REFERRING CLINICIAN: Nitka HISTORY FROM: patient and husband REASON FOR VISIT: new consult / existing patient     HISTORICAL  CHIEF COMPLAINT:  Chief Complaint  Patient presents with  . Tremors    HISTORY OF PRESENT ILLNESS:   UPDATE 04/08/15: Since last visit, essential tremor is slightly worse (left hand worse than right). Worried about medication side effects. Struggling with back and knee pain.    UPDATE 12/14/12: Since last visit, tremor is stable. DATscan was negative. Patient continues to have intermittent positional vertigo, when she lays down at night to go to sleep. Symptoms present for many years and previously she was told she had benign positional vertigo. Patient also has intermittent episodes of lightheadedness and dizziness when she stands up from sitting position after long time. The symptoms like this in the office today.  UPDATE 06/15/12: Doing about the same.Tremor and gait stable. No progression of sxs.  UPDATE 10/21/11: Doing about the same. Still with postural tremor (left > right) hands. Combing hair with left hand is difficult. Gait and balance ok.  PRIOR HPI (05/29/11): 8 year odl right-handed female with history of hypercholesterolemia, a lesions, breast cancer, here for evaluation of tremor.  Patient reports 3 years history of progressive left hand tremor, when she holds a plate or papers.  Next is developing mild tremor in her right hand as well. She denies resting tremor. Denies sleep disturbance, vivid dreams, loss of smell or taste, constipation. She has strong family history of tremor in her maternal grandfather, mother and brother. Her brother reports improvement of tremor with mild alcohol use. Patient has not tried this. Her mother was diagnosed with drug-induced parkinsonism related to haldol.   REVIEW OF SYSTEMS: Full 14 system review of systems performed and notable  only for fatigue hearing loss spinning sensation rash itching moles easy bleeding feeling cold dizziness tremor anxiety decr energy insomnia restless legs decr energy.    ALLERGIES: Allergies  Allergen Reactions  . Pravastatin Anaphylaxis  . Augmentin [Amoxicillin-Pot Clavulanate]     rash  . Ciprofloxacin Nausea Only    GI upset   . Flagyl [Metronidazole]   . Gold-Containing Drug Products     Per Dr  . Loma Sousa [Escitalopram Oxalate]     shaky  . Simvastatin     REACTION: leg cramps, weakness  . Tramadol     Watery eyes    HOME MEDICATIONS: Outpatient Prescriptions Prior to Visit  Medication Sig Dispense Refill  . carvedilol (COREG) 3.125 MG tablet TAKE 1 TABLET BY MOUTH 2 TIMES DAILY. 180 tablet 0  . Cholecalciferol 1000 UNITS tablet Take 1,000 Units by mouth daily. D-3    . Cyanocobalamin (VITAMIN B-12 IJ) Inject as directed every 30 (thirty) days.    [name] Dexlansoprazole (DEXILANT) 30 MG capsule Take 1 capsule (30 mg total) by mouth daily. 30 capsule 11  . flecainide (TAMBOCOR) 50 MG tablet Take 1 and 1/2 tablets by  mouth two times daily (Patient taking differently: TAKE 50 MG BY MOUTH TWICE DAILY) 270 tablet 2  . hydrocortisone 2.5 % cream Apply 1 application topically 3 (three) times daily as needed (itching).    . warfarin (COUMADIN) 3 MG tablet Take 3-4.5 mg by mouth daily. 52m by mouth MTTHFRISATSUN 4.58mon Wed.    . Marland Kitchenetirizine (ZYRTEC) 10 MG tablet Take 1 tablet (10 mg total) by mouth daily. (Patient not taking: Reported on 04/08/2015) 100 tablet 2  .  diazepam (VALIUM) 2 MG tablet Take 1 tablet (2 mg total) by mouth every 8 (eight) hours as needed for anxiety or muscle spasms (restless legs, dizziness). (Patient not taking: Reported on 04/08/2015) 60 tablet 3  . docusate sodium (COLACE) 100 MG capsule 1 tab 2 times a day while on narcotics.  STOOL SOFTENER (Patient not taking: Reported on 03/15/2015) 60 capsule 0  . losartan (COZAAR) 50 MG tablet Take 0.5 tablets (25 mg  total) by mouth daily. (Patient not taking: Reported on 04/08/2015) 30 tablet 5  . ondansetron (ZOFRAN) 4 MG tablet Take 1 tablet (4 mg total) by mouth every 8 (eight) hours as needed for nausea or vomiting. (Patient not taking: Reported on 03/15/2015) 30 tablet 0   No facility-administered medications prior to visit.    PAST MEDICAL HISTORY: Past Medical History  Diagnosis Date  . Colon polyps   . Diverticulosis of colon   . GERD (gastroesophageal reflux disease)   . Osteoporosis   . TR (tricuspid regurgitation)     Mild  . Vitamin B12 deficiency   . Vitamin D deficiency   . History of breast cancer   . Hyperlipidemia   . Familial tremor   . LBP (low back pain)   . Cataract   . Atrial fibrillation     D Taylor  . TIA (transient ischemic attack)   . Hemorrhoids   . Breast cancer 1984  . Hypertension   . Dysrhythmia     atrial fib  . Anxiety     has lorazepam on hand for nervousness, pt. reports that she had a break-in to her home on 05/2014  . History of hiatal hernia   . Neuromuscular disorder     essential tremor  . Osteoarthritis     hands & back & knees   . Primary localized osteoarthritis of left knee   . Coronary artery disease   . AF (atrial fibrillation)   . Stroke     PAST SURGICAL HISTORY: Past Surgical History  Procedure Laterality Date  . Breast enhancement surgery    . Cataract extraction    . Total knee arthroplasty  Dec 2011    Right - Dr Noemi Chapel  . Bilateral mastectomy    . Polypectomy    . Colonoscopy    . Vaginal hysterectomy      [facility] BSO  . Oophorectomy      BSO  . Breast surgery      Bilateral mastectomy  . Augmentation mammaplasty    . Mastectomy  1984    bilateral  . Eye surgery      cataracts removed- /w iol  & blepheroplasty  . Joint replacement Right   . Total knee arthroplasty Left 08/27/2014    dr Noemi Chapel  . Total knee arthroplasty Left 08/27/2014    Procedure: LEFT TOTAL KNEE ARTHROPLASTY;  Surgeon: Lorn Junes, MD;   Location: [place];  Service: Orthopedics;  Laterality: Left;    FAMILY HISTORY: Family History  Problem Relation Age of Onset  . Early death Neg Hx   . Stroke Neg Hx   . Breast cancer Mother 34  . Tremor Mother   . Tremor Brother   . Colon cancer Maternal Aunt   . Ovarian cancer Maternal Grandmother     SOCIAL HISTORY:  Social History   Social History  . Marital Status: Single    Spouse Name: N/A  . Number of Children: 0  . Years of Education: College   Occupational History  .  [employer], Retired    Social History Main Topics  . Smoking status: Never Smoker   . Smokeless tobacco: Never Used  . Alcohol Use: No  . Drug Use: No  . Sexual Activity: No     Comment: HYST   Other Topics Concern  . Not on file   Social History Narrative   ** Merged History Encounter **       Regular Exercise -  NO           PHYSICAL EXAM  Filed Vitals:   04/08/15 1044  BP: 132/68  Pulse: 53  Height: 5' 2"  (1.575 m)  Weight: 131 lb (59.421 kg)   Body mass index is 23.95 kg/(m^2).  EXAM: General: Patient is awake, alert and in no acute distress.  Well developed and groomed.  NEG MYERSON'S.   Neck: Neck is supple. Cardiovascular: No carotid artery bruits.  Heart is regular rate and rhythm with no murmurs.  Neurologic Exam  Mental Status: Awake, alert.  Language is fluent and comprehension intact. Cranial Nerves: No evidence of papilledema on funduscopic exam.  Pupils are equal and reactive to light.  Visual fields are full to confrontation.  Conjugate eye movements are full and symmetric.  Facial sensation and strength are symmetric.  Hearing is intact.  Palate elevated symmetrically and uvula is midline.  Shoulder shrug is symmetric.  Tongue is midline. Motor: Normal bulk.  MINIMAL POSTURAL TREMOR OF LUE.  NO COGWHEELING AT BASELINE; NO BRADYKINESIA. INTERMITTENT REST TREMOR IN MOUTH AND LUE WITH CONTRALATERAL ALTERNATING MOVEMENTS. DIFFUSE 4/5 STRENGTH IN BUE AND BLE. Sensory:  Intact and symmetric to light touch. Coordination: No ataxia or dysmetria on finger-nose or rapid alternating movement testing. Reflexes: BUE 1, KNEES TRACE, ANKLES ABSENT. Gait and Station: Narrow based gait.  STOOPED POSTURE. USES SINGLE POINT CANE.      DIAGNOSTIC DATA (LABS, IMAGING, TESTING) - I reviewed patient records, labs, notes, testing and imaging myself where available.  Lab Results  Component Value Date   WBC 3.9* 03/05/2015   HGB 12.1 03/05/2015   HCT 37.3 03/05/2015   MCV 85.9 03/05/2015   PLT 216 03/05/2015      Component Value Date/Time   NA 140 03/05/2015 1015   K 3.9 03/05/2015 1015   CL 106 03/05/2015 1015   CO2 28 03/05/2015 1015   GLUCOSE 88 03/05/2015 1015   BUN 9 03/05/2015 1015   CREATININE 0.69 03/05/2015 1015   CALCIUM 9.1 03/05/2015 1015   PROT 6.4* 02/01/2015 1630   ALBUMIN 3.9 02/01/2015 1630   AST 20 02/01/2015 1630   ALT 16 02/01/2015 1630   ALKPHOS 47 02/01/2015 1630   BILITOT 0.6 02/01/2015 1630   GFRNONAA >60 03/05/2015 1015   GFRAA >60 03/05/2015 1015   Lab Results  Component Value Date   CHOL 189 10/30/2014   HDL 41.30 10/30/2014   LDLCALC 118* 10/30/2014   LDLDIRECT 178.0 09/24/2011   TRIG 151.0* 10/30/2014   CHOLHDL 5 10/30/2014   No results found for: HGBA1C Lab Results  Component Value Date   JQBHALPF79 024 12/05/2014   Lab Results  Component Value Date   TSH 1.991 02/01/2015    08/03/12 DATscan - normal    ASSESSMENT AND PLAN  81 y.o. year old female  has a past medical history of Colon polyps; Diverticulosis of colon; GERD (gastroesophageal reflux disease); Osteoporosis; TR (tricuspid regurgitation); Vitamin B12 deficiency; Vitamin D deficiency; History of breast cancer; Hyperlipidemia; Familial tremor; LBP (low back pain); Cataract; Atrial fibrillation; TIA (transient  ischemic attack); Hemorrhoids; Breast cancer (1984); Hypertension; Dysrhythmia; Anxiety; History of hiatal hernia; Neuromuscular disorder;  Osteoarthritis; Primary localized osteoarthritis of left knee; Coronary artery disease; AF (atrial fibrillation); and Stroke. here with progressive postural tremor since 2009. Patient's brother also has essential tremor.  Dx: Essential tremor    PLAN: I spent 15 minutes of face to face time with patient. Greater than 50% of time was spent in counseling and coordination of care with patient. In summary we discussed:  1. Observation for now, since she is doing well on valium; if tremor significantly worsens can consider increasing valium vs adding primidone.  Return if symptoms worsen or fail to improve, for return to PCP.     Penni Bombard, MD 1/58/7276, 18:48 AM Certified in Neurology, Neurophysiology and Neuroimaging  [facility] [address] [place], [place] [address] [phone]

## 2015-04-09 ENCOUNTER — Other Ambulatory Visit (INDEPENDENT_AMBULATORY_CARE_PROVIDER_SITE_OTHER): Payer: Medicare Other

## 2015-04-09 ENCOUNTER — Ambulatory Visit (INDEPENDENT_AMBULATORY_CARE_PROVIDER_SITE_OTHER): Payer: Medicare Other | Admitting: Internal Medicine

## 2015-04-09 ENCOUNTER — Encounter: Payer: Self-pay | Admitting: Internal Medicine

## 2015-04-09 ENCOUNTER — Ambulatory Visit (INDEPENDENT_AMBULATORY_CARE_PROVIDER_SITE_OTHER): Payer: Self-pay | Admitting: General Practice

## 2015-04-09 VITALS — BP 120/62 | HR 80 | Temp 97.6°F | Ht 62.0 in | Wt 129.0 lb

## 2015-04-09 DIAGNOSIS — E559 Vitamin D deficiency, unspecified: Secondary | ICD-10-CM

## 2015-04-09 DIAGNOSIS — E538 Deficiency of other specified B group vitamins: Secondary | ICD-10-CM

## 2015-04-09 DIAGNOSIS — M810 Age-related osteoporosis without current pathological fracture: Secondary | ICD-10-CM | POA: Diagnosis not present

## 2015-04-09 DIAGNOSIS — S32040A Wedge compression fracture of fourth lumbar vertebra, initial encounter for closed fracture: Secondary | ICD-10-CM

## 2015-04-09 DIAGNOSIS — I4891 Unspecified atrial fibrillation: Secondary | ICD-10-CM | POA: Diagnosis not present

## 2015-04-09 DIAGNOSIS — M5440 Lumbago with sciatica, unspecified side: Secondary | ICD-10-CM

## 2015-04-09 DIAGNOSIS — G250 Essential tremor: Secondary | ICD-10-CM

## 2015-04-09 LAB — BASIC METABOLIC PANEL
BUN: 16 mg/dL (ref 6–23)
CO2: 30 mEq/L (ref 19–32)
Calcium: 9.7 mg/dL (ref 8.4–10.5)
Chloride: 103 mEq/L (ref 96–112)
Creatinine, Ser: 0.67 mg/dL (ref 0.40–1.20)
GFR: 90.11 mL/min (ref >60.00)
Glucose, Bld: 83 mg/dL (ref 70–99)
POTASSIUM: 4 meq/L (ref 3.5–5.1)
SODIUM: 139 meq/L (ref 135–145)

## 2015-04-09 LAB — POCT INR: INR: 2

## 2015-04-09 LAB — VITAMIN D 25 HYDROXY (VIT D DEFICIENCY, FRACTURES): VITD: 42.22 ng/mL (ref 30.00–100.00)

## 2015-04-09 MED ORDER — NYSTATIN 100000 UNIT/ML MT SUSP: 500000.0000 [IU] | Freq: Four times a day (QID) | OROMUCOSAL | Status: DC

## 2015-04-09 NOTE — Assessment & Plan Note (Addendum)
Pt had vertebroplasty and steroid injections 7/16. Better Diazepam prn compr fx - spine 2016 Remaining options to treat were discussed. Info on Prolia given

## 2015-04-09 NOTE — Assessment & Plan Note (Signed)
Diazepam prn

## 2015-04-09 NOTE — Assessment & Plan Note (Signed)
Pt had vertebroplasty and steroid injections 7/16. Better

## 2015-04-09 NOTE — Assessment & Plan Note (Signed)
B12 inj

## 2015-04-09 NOTE — Progress Notes (Signed)
I have reviewed and agree with the plan.

## 2015-04-09 NOTE — Progress Notes (Signed)
Pre visit review using our clinic review tool, if applicable. No additional management support is needed unless otherwise documented below in the visit note. 

## 2015-04-09 NOTE — Progress Notes (Signed)
Subjective:  Patient ID: MICHA ERCK, female    DOB: [birth year]  Age: 81 y.o. MRN: [id number]  CC: No chief complaint on file.   HPI JEANET LUPE presents for LBP, not feeling well, nausea - much better on Diazepam at low dose... Pt had a tick bite in the LLE - no rash. Pt had 3 spinal injections  Outpatient Prescriptions Prior to Visit  Medication Sig Dispense Refill  . acetaminophen (TYLENOL) 325 MG tablet Take 650 mg by mouth every 6 (six) hours as needed.    . carvedilol (COREG) 3.125 MG tablet TAKE 1 TABLET BY MOUTH 2 TIMES DAILY. 180 tablet 0  . cetirizine (ZYRTEC) 10 MG tablet Take 1 tablet (10 mg total) by mouth daily. 100 tablet 2  . Cholecalciferol 1000 UNITS tablet Take 1,000 Units by mouth daily. D-3    . Cyanocobalamin (VITAMIN B-12 IJ) Inject as directed every 30 (thirty) days.    [name] Dexlansoprazole (DEXILANT) 30 MG capsule Take 1 capsule (30 mg total) by mouth daily. 30 capsule 11  . diazepam (VALIUM) 2 MG tablet Take 1 tablet (2 mg total) by mouth every 8 (eight) hours as needed for anxiety or muscle spasms (restless legs, dizziness). 60 tablet 3  . docusate sodium (COLACE) 100 MG capsule 1 tab 2 times a day while on narcotics.  STOOL SOFTENER 60 capsule 0  . flecainide (TAMBOCOR) 50 MG tablet Take 1 and 1/2 tablets by  mouth two times daily (Patient taking differently: TAKE 50 MG BY MOUTH TWICE DAILY) 270 tablet 2  . hydrocortisone 2.5 % cream Apply 1 application topically 3 (three) times daily as needed (itching).    [name] losartan (COZAAR) 50 MG tablet Take 0.5 tablets (25 mg total) by mouth daily. 30 tablet 5  . ondansetron (ZOFRAN) 4 MG tablet Take 1 tablet (4 mg total) by mouth every 8 (eight) hours as needed for nausea or vomiting. 30 tablet 0  . warfarin (COUMADIN) 3 MG tablet Take 3-4.5 mg by mouth daily. 69m by mouth MTTHFRISATSUN 4.570mon Wed.     No facility-administered medications prior to visit.    ROS Review of Systems  Constitutional: Negative  for chills, activity change, appetite change, fatigue and unexpected weight change.  HENT: Negative for congestion, mouth sores and sinus pressure.   Eyes: Negative for visual disturbance.  Respiratory: Negative for cough and chest tightness.   Gastrointestinal: Negative for nausea and abdominal pain.  Genitourinary: Positive for dysuria. Negative for frequency, difficulty urinating and vaginal pain.  Musculoskeletal: Positive for back pain and gait problem.  Skin: Negative for pallor and rash.  Neurological: Positive for tremors. Negative for dizziness, weakness, numbness and headaches.  Psychiatric/Behavioral: Negative for suicidal ideas, confusion and sleep disturbance.    Objective:  BP 120/62 mmHg  Pulse 80  Temp(Src) 97.6 F (36.4 C) (Oral)  Ht 5' 2"  (1.575 m)  Wt 129 lb (58.514 kg)  BMI 23.59 kg/m2  SpO2 95%  BP Readings from Last 3 Encounters:  04/09/15 120/62  04/08/15 132/68  03/15/15 158/80    Wt Readings from Last 3 Encounters:  04/09/15 129 lb (58.514 kg)  04/08/15 131 lb (59.421 kg)  03/15/15 127 lb (57.607 kg)    Physical Exam  Constitutional: She appears well-developed. No distress.  HENT:  Head: Normocephalic.  Right Ear: External ear normal.  Left Ear: External ear normal.  Nose: Nose normal.  Mouth/Throat: Oropharynx is clear and moist.  Eyes: Conjunctivae are normal. Pupils are equal, round, and  reactive to light. Right eye exhibits no discharge. Left eye exhibits no discharge.  Neck: Normal range of motion. Neck supple. No JVD present. No tracheal deviation present. No thyromegaly present.  Cardiovascular: Normal rate, regular rhythm and normal heart sounds.   Pulmonary/Chest: No stridor. No respiratory distress. She has no wheezes.  Abdominal: Soft. Bowel sounds are normal. She exhibits no distension and no mass. There is no tenderness. There is no rebound and no guarding.  Musculoskeletal: She exhibits tenderness. She exhibits no edema.    Lymphadenopathy:    She has no cervical adenopathy.  Neurological: She displays normal reflexes. No cranial nerve deficit. She exhibits normal muscle tone. Coordination normal.  Skin: No rash noted. No erythema.  Psychiatric: She has a normal mood and affect. Her behavior is normal. Judgment and thought content normal.  Using a cane LS spine tender Mild thrush on   Lab Results  Component Value Date   WBC 3.9* 03/05/2015   HGB 12.1 03/05/2015   HCT 37.3 03/05/2015   PLT 216 03/05/2015   GLUCOSE 88 03/05/2015   CHOL 189 10/30/2014   TRIG 151.0* 10/30/2014   HDL 41.30 10/30/2014   LDLDIRECT 178.0 09/24/2011   LDLCALC 118* 10/30/2014   ALT 16 02/01/2015   AST 20 02/01/2015   NA 140 03/05/2015   K 3.9 03/05/2015   CL 106 03/05/2015   CREATININE 0.69 03/05/2015   BUN 9 03/05/2015   CO2 28 03/05/2015   TSH 1.991 02/01/2015   INR 1.4 03/27/2015    Ir Radiologist Eval & Mgmt  03/20/2015   EXAM: ESTABLISHED PATIENT OFFICE VISIT  CHIEF COMPLAINT: Low back pain.  Current Pain Level: 1-10  HISTORY OF PRESENT ILLNESS: Patient is a 81 year old, right handed lady who presents for follow-up to a vertebral body augmentation at L1 approximately 2 weeks ago.  She is accompanied by her husband.  Patient reports mild to modest improvement in her pain in the low back subsequent to the procedure.  She continues to maintain that there has been only about 10-15% improvement since the procedure.  She reports the pain in the same region, the low back region, usually with standing but not with walking or sitting down. She is able to lay and sleep at night better than that she could before.  She is ambulating better though she has to use a walker and a cane.  Her appetite is normal.  Her weight is steady.  She denies any recent chills or fever or rigors. She denies any radiating pain into her lower extremities or in to her lower thoracic or upper cervical spine.  She denies any symptoms of UTI such as dysuria,  frequency. hematuria or polyuria.  Her past medical history, surgical history, social history, family history are as per our previous H and P at the time of the procedure 2 weeks ago unchanged.  Present Medications:  Flecainide.  Carvedilol.  Probiotics.  Valium.  Allergies are Cipro, Flagyl, milk, dust mites, dog dander, shrimp, gold.  Her review of systems essentially unremarkable except for as mentioned above.  PHYSICAL EXAMINATION: Appears in no acute distress. Affect normal. Neurologically intact. Has no centralized tenderness in the thoracolumbar region to the sacral area. Does have mild to moderate focal tenderness paraspinal regions at the level of L4-L5.  ASSESSMENT AND PLAN: The patient has been advised to continue with conservative management in terms of using her walker and/or cane all the time. Also she has been advised to be aware of not stooping  bending or lifting anything above 10 lb for another couple of weeks. She has been advised to continue with heating pads in the lumbar region and to continue taking her Tylenol as indicated.  Questions were answered to her satisfaction.  Plan is to re-evaluate in 2 weeks. Should her symptoms not show any signs of getting better, further evaluation with an MRI scan of the lumbosacral spine may be needed.   Electronically Signed   By: Luanne Bras M.D.   On: 03/19/2015 14:04    Assessment & Plan:   There are no diagnoses linked to this encounter. I am having Ms. Lea maintain her Cholecalciferol, docusate sodium, flecainide, ondansetron, Cyanocobalamin (VITAMIN B-12 IJ), carvedilol, cetirizine, warfarin, Dexlansoprazole, hydrocortisone, losartan, diazepam, and acetaminophen.  No orders of the defined types were placed in this encounter.     Follow-up: No Follow-up on file.  Walker Kehr, MD

## 2015-04-09 NOTE — Assessment & Plan Note (Addendum)
compr fx - spine 2016 Remaining options to treat were discussed. Info on Prolia given

## 2015-04-09 NOTE — Telephone Encounter (Signed)
Pt informed

## 2015-04-10 ENCOUNTER — Ambulatory Visit: Payer: Medicare Other

## 2015-04-11 MED ORDER — CYANOCOBALAMIN 1000 MCG/ML IJ SOLN
1000.0000 ug | Freq: Once | INTRAMUSCULAR | Status: AC
  Administered 2015-04-09: 1000 ug via INTRAMUSCULAR

## 2015-04-11 NOTE — Addendum Note (Signed)
Addended by: Levonne Lapping on: 04/11/2015 08:33 AM   Modules accepted: Orders

## 2015-04-17 ENCOUNTER — Ambulatory Visit: Payer: Medicare Other | Admitting: Internal Medicine

## 2015-04-23 ENCOUNTER — Ambulatory Visit (INDEPENDENT_AMBULATORY_CARE_PROVIDER_SITE_OTHER): Payer: Medicare Other | Admitting: General Practice

## 2015-04-23 ENCOUNTER — Ambulatory Visit: Payer: Medicare Other

## 2015-04-23 DIAGNOSIS — I4891 Unspecified atrial fibrillation: Secondary | ICD-10-CM | POA: Diagnosis not present

## 2015-04-23 LAB — POCT INR: INR: 2

## 2015-04-23 NOTE — Progress Notes (Signed)
Pre visit review using our clinic review tool, if applicable. No additional management support is needed unless otherwise documented below in the visit note. 

## 2015-04-23 NOTE — Progress Notes (Signed)
I have reviewed and agree with the plan.

## 2015-04-30 ENCOUNTER — Other Ambulatory Visit (INDEPENDENT_AMBULATORY_CARE_PROVIDER_SITE_OTHER): Payer: Medicare Other

## 2015-04-30 ENCOUNTER — Ambulatory Visit (INDEPENDENT_AMBULATORY_CARE_PROVIDER_SITE_OTHER): Payer: Medicare Other | Admitting: Physician Assistant

## 2015-04-30 ENCOUNTER — Encounter: Payer: Self-pay | Admitting: Physician Assistant

## 2015-04-30 VITALS — BP 122/60 | HR 64 | Ht 62.0 in | Wt 133.0 lb

## 2015-04-30 DIAGNOSIS — R1032 Left lower quadrant pain: Secondary | ICD-10-CM

## 2015-04-30 DIAGNOSIS — K5730 Diverticulosis of large intestine without perforation or abscess without bleeding: Secondary | ICD-10-CM | POA: Diagnosis not present

## 2015-04-30 LAB — BASIC METABOLIC PANEL
BUN: 10 mg/dL (ref 6–23)
CHLORIDE: 105 meq/L (ref 96–112)
CO2: 30 mEq/L (ref 19–32)
CREATININE: 0.68 mg/dL (ref 0.40–1.20)
Calcium: 9.4 mg/dL (ref 8.4–10.5)
GFR: 88.57 mL/min (ref >60.00)
GLUCOSE: 78 mg/dL (ref 70–99)
POTASSIUM: 4 meq/L (ref 3.5–5.1)
Sodium: 141 mEq/L (ref 135–145)

## 2015-04-30 NOTE — Patient Instructions (Signed)
Please go to the basement level to have your labs drawn.    You have been scheduled for a CT scan of the abdomen and pelvis at [facility] ([address]---this is in the same building as Press photographer).   You are scheduled on 05-03-2015 at 2:30 PM . You should arrive  2:15 PM to your appointment time for registration. Please follow the written instructions below on the day of your exam:  WARNING: IF YOU ARE ALLERGIC TO IODINE/X-RAY DYE, PLEASE NOTIFY RADIOLOGY IMMEDIATELY AT [phone]! YOU WILL BE GIVEN A 13 HOUR PREMEDICATION PREP.  1) Do not eat or drink anything after  10:30 am  (4 hours prior to your test) 2) You have been given 2 bottles of oral contrast to drink. The solution may taste  better if refrigerated, but do NOT add ice or any other liquid to this solution. Shake well before drinking.    Drink 1 bottle of contrast @  12:30 PM  (2 hours prior to your exam)  Drink 1 bottle of contrast @ 1:30 PM  (1 hour prior to your exam)  You may take any medications as prescribed with a small amount of water except for the following: Metformin, Glucophage, Glucovance, Avandamet, Riomet, Fortamet, Actoplus Met, Janumet, Glumetza or Metaglip. The above medications must be held the day of the exam AND 48 hours after the exam.  The purpose of you drinking the oral contrast is to aid in the visualization of your intestinal tract. The contrast solution may cause some diarrhea. Before your exam is started, you will be given a small amount of fluid to drink. Depending on your individual set of symptoms, you may also receive an intravenous injection of x-ray contrast/dye. Plan on being at [facility] for 30 minutes or long, depending on the type of exam you are having performed.  If you have any questions regarding your exam or if you need to reschedule, you may call the CT department at [phone] between the hours of 8:00 am and 5:00 pm,  Monday-Friday.  ________________________________________________________________________

## 2015-04-30 NOTE — Progress Notes (Addendum)
Patient ID: Alyssa Luna, female   DOB: 1933-11-09, 81 y.o.   MRN: [id number]   Subjective:    Patient ID: Alyssa Luna, female    DOB: 1934-01-30, 81 y.o.   MRN: [id number]  HPI Alyssa Luna is a pleasant 81 year old white female known to Alyssa Luna. He has remote history of breast cancer, she status post hysterectomy and BSO, also with history of atrial fibrillation for which she is on Coumadin, degenerative joint disease, severe diverticular disease and chronic GERD. She has had dilation of a Schatzki's ring in 2013. Her last colonoscopy was in 2013 showing severe diffuse diverticulosis. She comes in today with complaints of several month history of intermittent left lower quadrant pain she says she feels that her symptoms started this spring after she had had knee surgery and had significant constipation. Now she has discomfort in her left lower quadrant after every bowel movement and says the discomfort is nagging and dull it may last at least an hour. She says her bowels are moving regularly at this point generally has a bowel movement in the morning and may have 1 or 2 other bowel movements during the day. Been a lot of problems with back pain has been seeing Dr. needed, and actually had injections of her back 3 over the past few months. She is being referred by Dr. Merrilee Luna to today for evaluation of left lower quadrant pain to sort out whether this is of orthopedic etiology or GI. I and She's not noted any melena or hematochezia. Her appetite has been good and she's actually finally gaining weight. She's not noticed any change in her pain with by mouth intake. He says gradually over the past month or so the pain has become more constant and present on a daily basis. She is also had some burning with urination. She says she feels as if her urethra is irritated. She had a UA done a couple of weeks ago which was unremarkable.  Review of Systems Pertinent positive and negative review of systems were  noted in the above HPI section.  All other review of systems was otherwise negative.  Outpatient Encounter Prescriptions as of 04/30/2015  Medication Sig  . acetaminophen (TYLENOL) 325 MG tablet Take 650 mg by mouth every 6 (six) hours as needed.  [name] alendronate (FOSAMAX) 70 MG tablet Take 70 mg by mouth once a week. Take with a full glass of water on an empty stomach.  . carvedilol (COREG) 3.125 MG tablet TAKE 1 TABLET BY MOUTH 2 TIMES DAILY.  [name] Cholecalciferol 1000 UNITS tablet Take 1,000 Units by mouth daily. D-3  . Cyanocobalamin (VITAMIN B-12 IJ) Inject as directed every 30 (thirty) days.  [name] Dexlansoprazole (DEXILANT) 30 MG capsule Take 1 capsule (30 mg total) by mouth daily.  . diazepam (VALIUM) 2 MG tablet Take 1 tablet (2 mg total) by mouth every 8 (eight) hours as needed for anxiety or muscle spasms (restless legs, dizziness).  [name] docusate sodium (COLACE) 100 MG capsule 1 tab 2 times a day while on narcotics.  STOOL SOFTENER  . flecainide (TAMBOCOR) 50 MG tablet Take 1 and 1/2 tablets by  mouth two times daily (Patient taking differently: TAKE 50 MG BY MOUTH TWICE DAILY)  . hydrocortisone 2.5 % cream Apply 1 application topically 3 (three) times daily as needed (itching).  [name] losartan (COZAAR) 50 MG tablet Take 0.5 tablets (25 mg total) by mouth daily.  [name] nystatin (MYCOSTATIN) 100000 UNIT/ML suspension Take 5 mLs (500,000 Units total) by mouth  4 (four) times daily. Swish, hold and swallow 7-10 days  . ondansetron (ZOFRAN) 4 MG tablet Take 1 tablet (4 mg total) by mouth every 8 (eight) hours as needed for nausea or vomiting.  . warfarin (COUMADIN) 3 MG tablet Take 3-4.5 mg by mouth daily. As directed  . [DISCONTINUED] cetirizine (ZYRTEC) 10 MG tablet Take 1 tablet (10 mg total) by mouth daily. (Patient not taking: Reported on 04/30/2015)   No facility-administered encounter medications on file as of 04/30/2015.   Allergies  Allergen Reactions  . Pravastatin Anaphylaxis  . Actonel  [Risedronate Sodium]   . Augmentin [Amoxicillin-Pot Clavulanate]     rash  . Ciprofloxacin Nausea Only    GI upset   . Evista [Raloxifene]   . Flagyl [Metronidazole]   . Fosamax [Alendronate Sodium]   . Gold-Containing Drug Products     Per Dr  . Alyssa Luna [Escitalopram Oxalate]     shaky  . Simvastatin     REACTION: leg cramps, weakness  . Tramadol     Watery eyes   Patient Active Problem List   Diagnosis Date Noted  . Closed wedge compression fracture of fourth lumbar vertebra   . Thrush, oral 01/04/2015  . Itching 01/04/2015  . Hypokalemia 11/06/2014  . Fatigue 10/19/2014  . LLQ abdominal pain 10/02/2014  . Swelling of left knee joint 10/02/2014  . Nausea without vomiting 10/02/2014  . DJD (degenerative joint disease) of knee 08/27/2014  . Primary localized osteoarthritis of left knee   . Breast cancer   . GERD (gastroesophageal reflux disease)   . Preop exam for internal medicine 06/13/2014  . Anxiety state 06/13/2014  . Bradycardia 11/09/2013  . Essential hypertension 11/09/2013  . Well adult exam 05/21/2013  . Preop cardiovascular exam 05/08/2013  . Essential tremor 12/14/2012  . Right hip pain 05/10/2012  . Vertigo 03/09/2012  . Dyslipidemia 03/31/2011  . Meningioma 02/03/2011  . TIA (transient ischemic attack) 11/13/2010  . Abnormal CT of brain 11/13/2010  . CAROTID BRUIT 07/28/2010  . Edema 05/02/2010  . Atrial fibrillation 04/08/2010  . SYNCOPE 03/31/2010  . LOW BACK PAIN 06/05/2009  . Chronic maxillary sinusitis 01/31/2009  . EFFUSION OF JOINT OTHER SPECIFIED SITE 01/31/2009  . Diarrhea 05/31/2008  . ABNORMAL CHEST XRAY 05/15/2008  . HEMATURIA, MICROSCOPIC, HX OF 08/04/2007  . B12 deficiency 03/21/2007  . Vitamin D deficiency 03/21/2007  . Disease of tricuspid valve 03/21/2007  . DIVERTICULOSIS, COLON 03/21/2007  . OSTEOARTHRITIS 03/21/2007  . Osteoporosis 03/21/2007  . Personal history of malignant neoplasm of breast 03/21/2007  . COLONIC  POLYPS, HX OF 03/21/2007   Social History   Social History  . Marital Status: Single    Spouse Name: N/A  . Number of Children: 0  . Years of Education: College   Occupational History  . [employer], Retired    Social History Main Topics  . Smoking status: Never Smoker   . Smokeless tobacco: Never Used  . Alcohol Use: No  . Drug Use: No  . Sexual Activity: No     Comment: HYST   Other Topics Concern  . Not on file   Social History Narrative   ** Merged History Encounter **       Regular Exercise -  NO          Ms. Lorenzetti's family history includes Breast cancer (age of onset: 86) in her mother; Colon cancer in her maternal aunt; Ovarian cancer in her maternal grandmother; Tremor in her brother and mother. There is  no history of Early death or Stroke.      Objective:    Filed Vitals:   04/30/15 0832  BP: 122/60  Pulse: 64    Physical Exam  well-developed elderly white female in no acute distress, pleasant blood pressure 122/60 pulse 64 height 5 foot 2 weight 133. HEENT; nontraumatic normocephalic EOMI PERRLA sclera anicteric, Neck ;supple no JVD, Cardiovascular; regular rate and rhythm with S1-S2 no murmur or gallop, Pulmonary ;clear bilaterally, Abdomen ;soft bowel sounds are present she has a soft bruit, there is mild tenderness to deep palpation in the left lower quadrant no guarding or rebound no palpable mass or hepatosplenomegaly, Rectal ;exam not done, Extremities ;no clubbing cyanosis or edema skin warm and dry, Neuropsych; mood and affect appropriate       Assessment & Plan:   #1 81 yo female with known severe diffuse diverticulosis with several month hx of intermittent LLQ pain,aggravated post Bm's and progressing to daily discomfort over the past month. Suspect  this  Is related to her diverticular disease -R/O spasm, low grade diverticulitis vs other intra abdominal inflammatory process #2 Hx Schatski's ring- dilated 2013 #3 chronic anticoagualtion-  Coumadin  #4 atrial fib  Plan; CBC, BMET  Schedule for CT abd/pelvis with contrast - further plans pending results of Ct    Amy S Esterwood PA-C 04/30/2015   Cc: Plotnikov, Evie Lacks, MD  Addendum: Reviewed and agree with initial management. Jerene Bears, MD

## 2015-05-02 ENCOUNTER — Other Ambulatory Visit: Payer: Self-pay | Admitting: Internal Medicine

## 2015-05-03 ENCOUNTER — Ambulatory Visit (INDEPENDENT_AMBULATORY_CARE_PROVIDER_SITE_OTHER)
Admission: RE | Admit: 2015-05-03 | Discharge: 2015-05-03 | Disposition: A | Discharge status: Home / Self Care | Payer: Medicare Other | Source: Ambulatory Visit | Attending: Physician Assistant | Admitting: Physician Assistant

## 2015-05-03 DIAGNOSIS — R1032 Left lower quadrant pain: Secondary | ICD-10-CM | POA: Diagnosis not present

## 2015-05-03 MED ORDER — IOHEXOL 300 MG/ML  SOLN
100.0000 mL | Freq: Once | INTRAMUSCULAR | Status: AC | PRN
  Administered 2015-05-03: 100 mL via INTRAVENOUS

## 2015-05-06 ENCOUNTER — Telehealth: Payer: Self-pay | Admitting: Physician Assistant

## 2015-05-06 NOTE — Telephone Encounter (Signed)
Advised her of the blood work. CT is un-reviewed by her provider. I will call her once it has been reviewed.

## 2015-05-08 ENCOUNTER — Ambulatory Visit (INDEPENDENT_AMBULATORY_CARE_PROVIDER_SITE_OTHER): Payer: Medicare Other | Admitting: General Practice

## 2015-05-08 ENCOUNTER — Other Ambulatory Visit: Payer: Self-pay

## 2015-05-08 DIAGNOSIS — I4891 Unspecified atrial fibrillation: Secondary | ICD-10-CM

## 2015-05-08 LAB — POCT INR: INR: 1.8

## 2015-05-08 MED ORDER — GLYCOPYRROLATE 2 MG PO TABS: 2.0000 mg | ORAL_TABLET | Freq: Every morning | ORAL | Status: DC

## 2015-05-08 NOTE — Progress Notes (Signed)
Pre visit review using our clinic review tool, if applicable. No additional management support is needed unless otherwise documented below in the visit note. 

## 2015-05-08 NOTE — Progress Notes (Signed)
I have reviewed and agree with the plan.

## 2015-05-10 ENCOUNTER — Telehealth: Payer: Self-pay | Admitting: Physician Assistant

## 2015-05-10 NOTE — Telephone Encounter (Signed)
She was advised to take this for the abdominal pain she saw you for. The CT showed diverticulosis. Robinul was for the possible colon spasms. She read the possible side effects and the interactions and she wants to know if there are other options. Please advise.

## 2015-05-13 ENCOUNTER — Other Ambulatory Visit: Payer: Self-pay | Admitting: Internal Medicine

## 2015-05-13 NOTE — Telephone Encounter (Signed)
All of the antispasmotics are going to have similar side effects possible- she does not have to take the robinul,but thought it may help her LLQ discomfort , it is just for symptom relief..... Is she still hurting all the time?

## 2015-05-14 ENCOUNTER — Ambulatory Visit: Payer: Medicare Other

## 2015-05-14 NOTE — Telephone Encounter (Signed)
Spoke with pt and she is aware. CT report sent to Dr. Louanne Skye per pt request.

## 2015-05-17 ENCOUNTER — Encounter: Payer: Self-pay | Admitting: Internal Medicine

## 2015-05-17 ENCOUNTER — Other Ambulatory Visit (INDEPENDENT_AMBULATORY_CARE_PROVIDER_SITE_OTHER): Payer: Medicare Other

## 2015-05-17 ENCOUNTER — Ambulatory Visit (INDEPENDENT_AMBULATORY_CARE_PROVIDER_SITE_OTHER): Payer: Medicare Other | Admitting: Internal Medicine

## 2015-05-17 ENCOUNTER — Ambulatory Visit: Payer: Medicare Other

## 2015-05-17 VITALS — BP 130/64 | HR 62 | Wt 131.0 lb

## 2015-05-17 DIAGNOSIS — M5440 Lumbago with sciatica, unspecified side: Secondary | ICD-10-CM | POA: Diagnosis not present

## 2015-05-17 DIAGNOSIS — I480 Paroxysmal atrial fibrillation: Secondary | ICD-10-CM

## 2015-05-17 DIAGNOSIS — E538 Deficiency of other specified B group vitamins: Secondary | ICD-10-CM

## 2015-05-17 DIAGNOSIS — Z23 Encounter for immunization: Secondary | ICD-10-CM | POA: Diagnosis not present

## 2015-05-17 DIAGNOSIS — I4891 Unspecified atrial fibrillation: Secondary | ICD-10-CM

## 2015-05-17 DIAGNOSIS — M810 Age-related osteoporosis without current pathological fracture: Secondary | ICD-10-CM

## 2015-05-17 LAB — PROTIME-INR
INR: 1.9 ratio — ABNORMAL HIGH (ref 0.8–1.0)
PROTHROMBIN TIME: 20.9 s — AB (ref 9.6–13.1)

## 2015-05-17 MED ORDER — CYANOCOBALAMIN 1000 MCG/ML IJ SOLN
1000.0000 ug | Freq: Once | INTRAMUSCULAR | Status: AC
  Administered 2015-05-17: 1000 ug via INTRAMUSCULAR

## 2015-05-17 MED ORDER — BUPRENORPHINE 5 MCG/HR TD PTWK: 5.0000 ug | MEDICATED_PATCH | TRANSDERMAL | Status: DC

## 2015-05-17 NOTE — Progress Notes (Signed)
Subjective:  Patient ID: Alyssa Luna, female    DOB: 08-17-1934  Age: 81 y.o. MRN: [id number]  CC: No chief complaint on file.   HPI Alyssa Luna presents for not feeling well, nausea x 1 year - much better on Diazepam at low dose... Pt had 3 spinal injections and a vertebroplasy. C/o LBP. Pt is on Fosamax per Dr Ashley Royalty   Outpatient Prescriptions Prior to Visit  Medication Sig Dispense Refill  . alendronate (FOSAMAX) 70 MG tablet Take 70 mg by mouth once a week. Take with a full glass of water on an empty stomach.    . carvedilol (COREG) 3.125 MG tablet TAKE 1 TABLET BY MOUTH TWICE A DAY 180 tablet 3  . Cholecalciferol 1000 UNITS tablet Take 1,000 Units by mouth daily. D-3    . Cyanocobalamin (VITAMIN B-12 IJ) Inject as directed every 30 (thirty) days.    [name] Dexlansoprazole (DEXILANT) 30 MG capsule Take 1 capsule (30 mg total) by mouth daily. 30 capsule 11  . diazepam (VALIUM) 2 MG tablet Take 1 tablet (2 mg total) by mouth every 8 (eight) hours as needed for anxiety or muscle spasms (restless legs, dizziness). 60 tablet 3  . docusate sodium (COLACE) 100 MG capsule 1 tab 2 times a day while on narcotics.  STOOL SOFTENER 60 capsule 0  . flecainide (TAMBOCOR) 50 MG tablet Take 1 and 1/2 tablets by  mouth two times daily 270 tablet 1  . hydrocortisone 2.5 % cream Apply 1 application topically 3 (three) times daily as needed (itching).    [name] losartan (COZAAR) 50 MG tablet Take 0.5 tablets (25 mg total) by mouth daily. 30 tablet 5  . nystatin (MYCOSTATIN) 100000 UNIT/ML suspension Take 5 mLs (500,000 Units total) by mouth 4 (four) times daily. Swish, hold and swallow 7-10 days 240 mL 0  . ondansetron (ZOFRAN) 4 MG tablet Take 1 tablet (4 mg total) by mouth every 8 (eight) hours as needed for nausea or vomiting. 30 tablet 0  . warfarin (COUMADIN) 3 MG tablet Take 3-4.5 mg by mouth daily. As directed    . acetaminophen (TYLENOL) 325 MG tablet Take 650 mg by mouth every 6 (six)  hours as needed.    [name] glycopyrrolate (ROBINUL) 2 MG tablet Take 1 tablet (2 mg total) by mouth every morning. (Patient not taking: Reported on 05/17/2015) 30 tablet 2   No facility-administered medications prior to visit.    ROS Review of Systems  Constitutional: Negative for chills, activity change, appetite change, fatigue and unexpected weight change.  HENT: Negative for congestion, mouth sores and sinus pressure.   Eyes: Negative for visual disturbance.  Respiratory: Negative for cough and chest tightness.   Gastrointestinal: Negative for nausea and abdominal pain.  Genitourinary: Positive for dysuria. Negative for frequency, difficulty urinating and vaginal pain.  Musculoskeletal: Positive for back pain and gait problem.  Skin: Negative for pallor and rash.  Neurological: Positive for tremors. Negative for dizziness, weakness, numbness and headaches.  Psychiatric/Behavioral: Negative for suicidal ideas, confusion and sleep disturbance.    Objective:  BP 130/64 mmHg  Pulse 62  Wt 131 lb (59.421 kg)  SpO2 96%  BP Readings from Last 3 Encounters:  05/17/15 130/64  04/30/15 122/60  04/09/15 120/62    Wt Readings from Last 3 Encounters:  05/17/15 131 lb (59.421 kg)  04/30/15 133 lb (60.328 kg)  04/09/15 129 lb (58.514 kg)    Physical Exam  Constitutional: She appears well-developed. No distress.  HENT:  Head: Normocephalic.  Right Ear: External ear normal.  Left Ear: External ear normal.  Nose: Nose normal.  Mouth/Throat: Oropharynx is clear and moist.  Eyes: Conjunctivae are normal. Pupils are equal, round, and reactive to light. Right eye exhibits no discharge. Left eye exhibits no discharge.  Neck: Normal range of motion. Neck supple. No JVD present. No tracheal deviation present. No thyromegaly present.  Cardiovascular: Normal rate, regular rhythm and normal heart sounds.   Pulmonary/Chest: No stridor. No respiratory distress. She has no wheezes.  Abdominal: Soft.  Bowel sounds are normal. She exhibits no distension and no mass. There is no tenderness. There is no rebound and no guarding.  Musculoskeletal: She exhibits tenderness. She exhibits no edema.  Lymphadenopathy:    She has no cervical adenopathy.  Neurological: She displays normal reflexes. No cranial nerve deficit. She exhibits normal muscle tone. Coordination normal.  Skin: No rash noted. No erythema.  Psychiatric: She has a normal mood and affect. Her behavior is normal. Judgment and thought content normal.  Using a cane LS spine tender   Lab Results  Component Value Date   WBC 3.9* 03/05/2015   HGB 12.1 03/05/2015   HCT 37.3 03/05/2015   PLT 216 03/05/2015   GLUCOSE 78 04/30/2015   CHOL 189 10/30/2014   TRIG 151.0* 10/30/2014   HDL 41.30 10/30/2014   LDLDIRECT 178.0 09/24/2011   LDLCALC 118* 10/30/2014   ALT 16 02/01/2015   AST 20 02/01/2015   NA 141 04/30/2015   K 4.0 04/30/2015   CL 105 04/30/2015   CREATININE 0.68 04/30/2015   BUN 10 04/30/2015   CO2 30 04/30/2015   TSH 1.991 02/01/2015   INR 1.9* 05/17/2015    Ir Radiologist Eval & Mgmt  03/20/2015   EXAM: ESTABLISHED PATIENT OFFICE VISIT  CHIEF COMPLAINT: Low back pain.  Current Pain Level: 1-10  HISTORY OF PRESENT ILLNESS: Patient is a 81 year old, right handed lady who presents for follow-up to a vertebral body augmentation at L1 approximately 2 weeks ago.  She is accompanied by her husband.  Patient reports mild to modest improvement in her pain in the low back subsequent to the procedure.  She continues to maintain that there has been only about 10-15% improvement since the procedure.  She reports the pain in the same region, the low back region, usually with standing but not with walking or sitting down. She is able to lay and sleep at night better than that she could before.  She is ambulating better though she has to use a walker and a cane.  Her appetite is normal.  Her weight is steady.  She denies any recent chills  or fever or rigors. She denies any radiating pain into her lower extremities or in to her lower thoracic or upper cervical spine.  She denies any symptoms of UTI such as dysuria, frequency. hematuria or polyuria.  Her past medical history, surgical history, social history, family history are as per our previous H and P at the time of the procedure 2 weeks ago unchanged.  Present Medications:  Flecainide.  Carvedilol.  Probiotics.  Valium.  Allergies are Cipro, Flagyl, milk, dust mites, dog dander, shrimp, gold.  Her review of systems essentially unremarkable except for as mentioned above.  PHYSICAL EXAMINATION: Appears in no acute distress. Affect normal. Neurologically intact. Has no centralized tenderness in the thoracolumbar region to the sacral area. Does have mild to moderate focal tenderness paraspinal regions at the level of L4-L5.  ASSESSMENT AND PLAN: The  patient has been advised to continue with conservative management in terms of using her walker and/or cane all the time. Also she has been advised to be aware of not stooping bending or lifting anything above 10 lb for another couple of weeks. She has been advised to continue with heating pads in the lumbar region and to continue taking her Tylenol as indicated.  Questions were answered to her satisfaction.  Plan is to re-evaluate in 2 weeks. Should her symptoms not show any signs of getting better, further evaluation with an MRI scan of the lumbosacral spine may be needed.   Electronically Signed   By: Luanne Bras M.D.   On: 03/19/2015 14:04    Assessment & Plan:   Diagnoses and all orders for this visit:  Osteoporosis  Midline low back pain with sciatica, sciatica laterality unspecified  Paroxysmal atrial fibrillation -     Protime-INR; Future  B12 deficiency -     cyanocobalamin ((VITAMIN B-12)) injection 1,000 mcg; Inject 1 mL (1,000 mcg total) into the muscle once.  Need for influenza vaccination -     Flu Vaccine QUAD 36+ mos  IM  Other orders -     buprenorphine (BUTRANS) 5 MCG/HR PTWK patch; Place 1 patch (5 mcg total) onto the skin once a week.   I have discontinued Ms. Goddard's acetaminophen and glycopyrrolate. I am also having her start on buprenorphine. Additionally, I am having her maintain her Cholecalciferol, docusate sodium, ondansetron, Cyanocobalamin (VITAMIN B-12 IJ), warfarin, Dexlansoprazole, hydrocortisone, losartan, diazepam, nystatin, alendronate, carvedilol, flecainide, celecoxib, and amoxicillin. We administered cyanocobalamin.  Meds ordered this encounter  Medications  . celecoxib (CELEBREX) 100 MG capsule    Sig: TAKE 1 CAPSULE BY MOUTH EVERYDAY WITH FOOD AS NEEDED FOR PAIN AND SWELLING    Refill:  2  . amoxicillin (AMOXIL) 500 MG capsule    Sig: TAKE 4 CAPSULES BY MOUTH ONE HOUR PRIOR TO DENTAL WORK    Refill:  0  . buprenorphine (BUTRANS) 5 MCG/HR PTWK patch    Sig: Place 1 patch (5 mcg total) onto the skin once a week.    Dispense:  4 patch    Refill:  2  . cyanocobalamin ((VITAMIN B-12)) injection 1,000 mcg    Sig:      Follow-up: Return in about 6 weeks (around 06/28/2015) for a follow-up visit.  Walker Kehr, MD

## 2015-05-17 NOTE — Progress Notes (Signed)
Pre visit review using our clinic review tool, if applicable. No additional management support is needed unless otherwise documented below in the visit note. 

## 2015-05-17 NOTE — Assessment & Plan Note (Signed)
Eliquis is a better option w/fluctuating INR on Coumadin

## 2015-05-17 NOTE — Assessment & Plan Note (Signed)
9/16 Pt is on Fosamax per Dr Ashley Royalty

## 2015-05-17 NOTE — Assessment & Plan Note (Signed)
9/16 will try low dose Butrans

## 2015-05-21 ENCOUNTER — Telehealth: Payer: Self-pay | Admitting: Internal Medicine

## 2015-05-21 NOTE — Telephone Encounter (Signed)
Pleases follow up with patient on INR medication

## 2015-06-04 ENCOUNTER — Ambulatory Visit (INDEPENDENT_AMBULATORY_CARE_PROVIDER_SITE_OTHER): Payer: Medicare Other | Admitting: General Practice

## 2015-06-04 DIAGNOSIS — I4891 Unspecified atrial fibrillation: Secondary | ICD-10-CM

## 2015-06-04 LAB — POCT INR: INR: 1.8

## 2015-06-04 NOTE — Progress Notes (Signed)
Pre visit review using our clinic review tool, if applicable. No additional management support is needed unless otherwise documented below in the visit note. 

## 2015-06-04 NOTE — Progress Notes (Signed)
I have reviewed and agree with the plan.

## 2015-06-06 ENCOUNTER — Other Ambulatory Visit: Payer: Self-pay | Admitting: *Deleted

## 2015-06-06 MED ORDER — DEXLANSOPRAZOLE 30 MG PO CPDR: 30.0000 mg | DELAYED_RELEASE_CAPSULE | Freq: Every day | ORAL | Status: DC

## 2015-06-06 MED ORDER — CARVEDILOL 3.125 MG PO TABS
3.1250 mg | ORAL_TABLET | Freq: Two times a day (BID) | ORAL | Status: DC
Start: 2015-06-06 — End: 2016-07-08

## 2015-06-07 ENCOUNTER — Telehealth: Payer: Self-pay | Admitting: Internal Medicine

## 2015-06-07 NOTE — Telephone Encounter (Signed)
Called patient about her message. Patient was instructed by doctor at [facility] to stop Coumadin 5 days (06/19/15) prior to CT guided steroid injections (06/25/15). Patient is worried after her INR jumping up to 11 (09/18/14) like it did after her knee surgery (08/27/14). Last INR was 1.8 (06/04/15). Patient stated she wants to know what Dr. Lovena Le thinks. Informed patient that Dr. Lovena Le is out of the office until the middle of next week. Informed her I would send a message to Dr. Lovena Le and his nurse Claiborne Billings. Patient verbalized understanding and wanted Dr. Lovena Le and Claiborne Billings to know how much she appreciates them.

## 2015-06-07 NOTE — Telephone Encounter (Signed)
New Message  Pt requested to speak w/ Claiborne Billings about upcoming CT guided steroid injection at [facility]. Pt stated they do not require clearance but wanted pt to speak w/ Dr Tanna Furry office concerning stopping WArfarin. Pt coumadin is monitored by Dr Alain Marion but she was told to follow up w/ Dr Lovena Le. Please call back and discuss.

## 2015-06-10 NOTE — Telephone Encounter (Signed)
Ok to stop coumadin as recommended

## 2015-06-11 NOTE — Telephone Encounter (Signed)
Called patient about Dr. Tanna Furry response. Patient verbalized understanding.

## 2015-06-13 ENCOUNTER — Other Ambulatory Visit: Payer: Self-pay | Admitting: Specialist

## 2015-06-13 ENCOUNTER — Ambulatory Visit (INDEPENDENT_AMBULATORY_CARE_PROVIDER_SITE_OTHER): Payer: Medicare Other | Admitting: Internal Medicine

## 2015-06-13 ENCOUNTER — Encounter: Payer: Self-pay | Admitting: Internal Medicine

## 2015-06-13 VITALS — BP 126/76 | HR 61 | Temp 97.7°F | Ht 63.0 in | Wt 135.0 lb

## 2015-06-13 DIAGNOSIS — M5440 Lumbago with sciatica, unspecified side: Secondary | ICD-10-CM | POA: Diagnosis not present

## 2015-06-13 DIAGNOSIS — E538 Deficiency of other specified B group vitamins: Secondary | ICD-10-CM

## 2015-06-13 DIAGNOSIS — B370 Candidal stomatitis: Secondary | ICD-10-CM

## 2015-06-13 DIAGNOSIS — J309 Allergic rhinitis, unspecified: Secondary | ICD-10-CM | POA: Diagnosis not present

## 2015-06-13 DIAGNOSIS — G8929 Other chronic pain: Secondary | ICD-10-CM | POA: Diagnosis not present

## 2015-06-13 DIAGNOSIS — R300 Dysuria: Secondary | ICD-10-CM | POA: Diagnosis not present

## 2015-06-13 DIAGNOSIS — M546 Pain in thoracic spine: Secondary | ICD-10-CM

## 2015-06-13 LAB — POCT URINALYSIS DIPSTICK
BILIRUBIN UA: NEGATIVE
Glucose, UA: NEGATIVE
KETONES UA: NEGATIVE
Nitrite, UA: NEGATIVE
PH UA: 6.5
Protein, UA: NEGATIVE
RBC UA: 25
Spec Grav, UA: 1.02
Urobilinogen, UA: 0.2

## 2015-06-13 MED ORDER — METHYLPREDNISOLONE ACETATE 80 MG/ML IJ SUSP
80.0000 mg | Freq: Once | INTRAMUSCULAR | Status: AC
  Administered 2015-06-13: 80 mg via INTRAMUSCULAR

## 2015-06-13 MED ORDER — NITROFURANTOIN MONOHYD MACRO 100 MG PO CAPS: 100.0000 mg | ORAL_CAPSULE | Freq: Two times a day (BID) | ORAL | Status: DC

## 2015-06-13 MED ORDER — CYANOCOBALAMIN 1000 MCG/ML IJ SOLN
1000.0000 ug | Freq: Once | INTRAMUSCULAR | Status: AC
  Administered 2015-06-13: 1000 ug via INTRAMUSCULAR

## 2015-06-13 NOTE — Progress Notes (Signed)
Subjective:    Patient ID: Alyssa Luna, female    DOB: 09-30-1933, 81 y.o.   MRN: [id number]  HPI here with acute onset mod to severe ST several days today now resolved, Pt denies chest pain, increased sob or doe, wheezing, orthopnea, PND, increased LE swelling, palpitations, dizziness or syncope.  Pt denies new neurological symptoms such as new headache, or facial or extremity weakness or numbness  Incidentally with onset 2 days mild dysuria as well, but Denies urinary symptoms such as  frequency, urgency, flank pain, hematuria or n/v, fever, chills. Does have several wks ongoing nasal allergy symptoms with clearish congestion, itch and sneezing, without fever, pain, ST, cough, swelling or wheezing. Asks for b12 as she is due.  Also notes tongue burning and red, slightly swollen. Also - Pt continues to have recurring LBP without change in severity, bowel or bladder change, fever, wt loss,  worsening LE pain/numbness/weakness, gait change or falls. Past Medical History  Diagnosis Date  . Colon polyps   . Diverticulosis of colon   . GERD (gastroesophageal reflux disease)   . Osteoporosis   . TR (tricuspid regurgitation)     Mild  . Vitamin B12 deficiency   . Vitamin D deficiency   . History of breast cancer   . Hyperlipidemia   . Familial tremor   . LBP (low back pain)   . Cataract   . Atrial fibrillation ([place])     D [place]  . TIA (transient ischemic attack)   . Hemorrhoids   . Breast cancer ([place]) 1984  . Hypertension   . Dysrhythmia     atrial fib  . Anxiety     has lorazepam on hand for nervousness, pt. reports that she had a break-in to her home on 05/2014  . History of hiatal hernia   . Neuromuscular disorder ([place])     essential tremor  . Osteoarthritis     hands & back & knees   . Primary localized osteoarthritis of left knee   . Coronary artery disease   . AF (atrial fibrillation) ([place])   . Stroke [facility])    Past Surgical History  Procedure Laterality Date  .  Breast enhancement surgery    . Cataract extraction    . Total knee arthroplasty  Dec 2011    Right - Dr Noemi Chapel  . Bilateral mastectomy    . Polypectomy    . Colonoscopy    . Vaginal hysterectomy      [facility] BSO  . Oophorectomy      BSO  . Breast surgery      Bilateral mastectomy  . Augmentation mammaplasty    . Mastectomy  1984    bilateral  . Eye surgery      cataracts removed- /w iol  & blepheroplasty  . Joint replacement Right   . Total knee arthroplasty Left 08/27/2014    dr Noemi Chapel  . Total knee arthroplasty Left 08/27/2014    Procedure: LEFT TOTAL KNEE ARTHROPLASTY;  Surgeon: Lorn Junes, MD;  Location: [place];  Service: Orthopedics;  Laterality: Left;    reports that she has never smoked. She has never used smokeless tobacco. She reports that she does not drink alcohol or use illicit drugs. family history includes Breast cancer (age of onset: 78) in her mother; Colon cancer in her maternal aunt; Ovarian cancer in her maternal grandmother; Tremor in her brother and mother. There is no history of Early death or Stroke. Allergies  Allergen Reactions  .  Pravastatin Anaphylaxis  . Actonel [Risedronate Sodium]   . Augmentin [Amoxicillin-Pot Clavulanate]     rash  . Ciprofloxacin Nausea Only    GI upset   . Evista [Raloxifene]   . Flagyl [Metronidazole]   . Fosamax [Alendronate Sodium]   . Gold-Containing Drug Products     Per Dr  . Loma Sousa [Escitalopram Oxalate]     shaky  . Simvastatin     REACTION: leg cramps, weakness  . Tramadol     Watery eyes   Current Outpatient Prescriptions on File Prior to Visit  Medication Sig Dispense Refill  . alendronate (FOSAMAX) 70 MG tablet Take 70 mg by mouth once a week. Take with a full glass of water on an empty stomach.    [name] amoxicillin (AMOXIL) 500 MG capsule TAKE 4 CAPSULES BY MOUTH ONE HOUR PRIOR TO DENTAL WORK  0  . buprenorphine (BUTRANS) 5 MCG/HR PTWK patch Place 1 patch (5 mcg total) onto the skin once a week. 4 patch 2   . carvedilol (COREG) 3.125 MG tablet Take 1 tablet (3.125 mg total) by mouth 2 (two) times daily. 180 tablet 3  . celecoxib (CELEBREX) 100 MG capsule TAKE 1 CAPSULE BY MOUTH EVERYDAY WITH FOOD AS NEEDED FOR PAIN AND SWELLING  2  . Cholecalciferol 1000 UNITS tablet Take 1,000 Units by mouth daily. D-3    . Cyanocobalamin (VITAMIN B-12 IJ) Inject as directed every 30 (thirty) days.    [name] Dexlansoprazole (DEXILANT) 30 MG capsule Take 1 capsule (30 mg total) by mouth daily. 90 capsule 3  . diazepam (VALIUM) 2 MG tablet Take 1 tablet (2 mg total) by mouth every 8 (eight) hours as needed for anxiety or muscle spasms (restless legs, dizziness). 60 tablet 3  . docusate sodium (COLACE) 100 MG capsule 1 tab 2 times a day while on narcotics.  STOOL SOFTENER 60 capsule 0  . flecainide (TAMBOCOR) 50 MG tablet Take 1 and 1/2 tablets by  mouth two times daily 270 tablet 1  . hydrocortisone 2.5 % cream Apply 1 application topically 3 (three) times daily as needed (itching).    [name] losartan (COZAAR) 50 MG tablet Take 0.5 tablets (25 mg total) by mouth daily. 30 tablet 5  . nystatin (MYCOSTATIN) 100000 UNIT/ML suspension Take 5 mLs (500,000 Units total) by mouth 4 (four) times daily. Swish, hold and swallow 7-10 days 240 mL 0  . warfarin (COUMADIN) 3 MG tablet Take 3-4.5 mg by mouth daily. As directed     No current facility-administered medications on file prior to visit.   Review of Systems  Constitutional: Negative for unusual diaphoresis or night sweats HENT: Negative for ringing in ear or discharge Eyes: Negative for double vision or worsening visual disturbance.  Respiratory: Negative for choking and stridor.   Gastrointestinal: Negative for vomiting or other signifcant bowel change Genitourinary: Negative for hematuria or change in urine volume.  Musculoskeletal: Negative for other MSK pain or swelling Skin: Negative for color change and worsening wound.  Neurological: Negative for tremors and numbness  other than noted  Psychiatric/Behavioral: Negative for decreased concentration or agitation other than above       Objective:   Physical Exam BP 126/76 mmHg  Pulse 61  Temp(Src) 97.7 F (36.5 C) (Oral)  Ht 5' 3"  (1.6 m)  Wt 135 lb (61.236 kg)  BMI 23.92 kg/m2  SpO2 95% VS noted,  Constitutional: Pt appears in no significant distress HENT: Head: NCAT.  Right Ear: External ear normal.  Left Ear: External  ear normal.  Eyes: . Pupils are equal, round, and reactive to light. Conjunctivae and EOM are normal Mouth: + erythem thrush rash to tongue with slight Neck: Normal range of motion. Neck supple.  Cardiovascular: Normal rate and regular rhythm.   Pulmonary/Chest: Effort normal and breath sounds without rales or wheezing.  Abd:  Soft, ND, + BS, mild tender low mid abd without guarding or rebound Neurological: Pt is alert. Not confused , motor grossly intact Skin: Skin is warm. No rash, no LE edema Psychiatric: Pt behavior is normal. No agitation.   POCT urinalysis dipstick   Status: Final result Visible to patient: Not Released Dx: Chronic midline low back pain with ...             Ref Range 1d ago  90moago  493mogo  67m52moo      Color, UA  yellow        Clarity, UA  clear        Glucose, UA  neg  NEGATIVE      Bilirubin, UA  neg        Ketones, UA  neg        Spec Grav, UA  1.020        Blood, UA  25        pH, UA  6.5        Protein, UA  neg        Urobilinogen, UA  0.2 0.2 0.2 0.2     Nitrite, UA  neg        Leukocytes, UA Negative  moderate (2+) (           Assessment & Plan:

## 2015-06-13 NOTE — Patient Instructions (Addendum)
The urine testing was mildly abnormal today  Please take all new medication as prescribed - the antibiotic  You had the steroid shot today, for the allergies, and the B12 shot  You can also take Mucinex (or it's generic off brand) for congestion, and tylenol as needed for pain, even with the coumadin  Please re-start your thrush medication at home - the nystatin  Please continue all other medications as before, and refills have been done if requested.  Please have the pharmacy call with any other refills you may need.  Please keep your appointments with your specialists as you may have planned

## 2015-06-13 NOTE — Progress Notes (Signed)
Pre visit review using our clinic review tool, if applicable. No additional management support is needed unless otherwise documented below in the visit note. 

## 2015-06-14 ENCOUNTER — Other Ambulatory Visit: Payer: Self-pay | Admitting: Internal Medicine

## 2015-06-16 DIAGNOSIS — R300 Dysuria: Secondary | ICD-10-CM | POA: Insufficient documentation

## 2015-06-16 NOTE — Assessment & Plan Note (Signed)
For otc allegra/nasacort asd,  to f/u any worsening symptoms or concerns

## 2015-06-16 NOTE — Assessment & Plan Note (Signed)
For b12 IM today, to f/u any worsening symptoms or concerns

## 2015-06-16 NOTE — Assessment & Plan Note (Signed)
For nystatin soln asd qid,  to f/u any worsening symptoms or concerns

## 2015-06-16 NOTE — Assessment & Plan Note (Signed)
Mild to mod, Udip abnormal, for antibx course,  to f/u any worsening symptoms or concerns

## 2015-06-16 NOTE — Assessment & Plan Note (Signed)
Known lumbar disc dz, but more likely discomfort currently related to UTI - for antibx as ordered,  to f/u any worsening symptoms or concerns

## 2015-06-17 ENCOUNTER — Telehealth: Payer: Self-pay | Admitting: Internal Medicine

## 2015-06-17 DIAGNOSIS — N3001 Acute cystitis with hematuria: Secondary | ICD-10-CM

## 2015-06-17 NOTE — Telephone Encounter (Signed)
How many days did se take Nitrofurantoin for? Thx

## 2015-06-17 NOTE — Telephone Encounter (Signed)
Patient called regarding nitrofurantoin, macrocrystal-monohydrate, (MACROBID) 100 MG capsule [[id number]] . She states that it is causing nausea. She states that she has been taking ondansetron (ZOFRAN) 4 MG tablet [[id number]] to help with the nausea. She is not sure if she should be taking this. Please call patient to advise.

## 2015-06-18 ENCOUNTER — Ambulatory Visit
Admission: RE | Admit: 2015-06-18 | Discharge: 2015-06-18 | Disposition: A | Discharge status: Home / Self Care | Payer: Medicare Other | Source: Ambulatory Visit | Attending: Specialist | Admitting: Specialist

## 2015-06-18 DIAGNOSIS — M546 Pain in thoracic spine: Secondary | ICD-10-CM

## 2015-06-19 ENCOUNTER — Other Ambulatory Visit (INDEPENDENT_AMBULATORY_CARE_PROVIDER_SITE_OTHER): Payer: Medicare Other

## 2015-06-19 DIAGNOSIS — N3001 Acute cystitis with hematuria: Secondary | ICD-10-CM | POA: Diagnosis not present

## 2015-06-19 LAB — URINALYSIS, ROUTINE W REFLEX MICROSCOPIC
Bilirubin Urine: NEGATIVE
Ketones, ur: NEGATIVE
Leukocytes, UA: NEGATIVE
NITRITE: NEGATIVE
Specific Gravity, Urine: 1.02 (ref 1.000–1.030)
TOTAL PROTEIN, URINE-UPE24: NEGATIVE
Urine Glucose: NEGATIVE
Urobilinogen, UA: 0.2 (ref 0.0–1.0)
pH: 6 (ref 5.0–8.0)

## 2015-06-19 NOTE — Telephone Encounter (Signed)
Called pt she states she have taken 5 days already still have 2 days left. She states she just feel awful don't think the infection is gone.wanting to have urine recheck to see if it has cleared up. She states if he want her to do labs that would be fine. She has a f/u appt with md on 11/10...[name]

## 2015-06-19 NOTE — Telephone Encounter (Signed)
OK UA and cx Thx

## 2015-06-19 NOTE — Telephone Encounter (Signed)
Notified pt with md response. Place order for UA/Cx...[name]

## 2015-06-21 LAB — URINE CULTURE
Colony Count: NO GROWTH
ORGANISM ID, BACTERIA: NO GROWTH

## 2015-06-27 ENCOUNTER — Encounter: Payer: Self-pay | Admitting: Internal Medicine

## 2015-06-27 ENCOUNTER — Ambulatory Visit (INDEPENDENT_AMBULATORY_CARE_PROVIDER_SITE_OTHER): Payer: Medicare Other | Admitting: Internal Medicine

## 2015-06-27 VITALS — BP 154/76 | HR 63 | Resp 9 | Wt 131.0 lb

## 2015-06-27 DIAGNOSIS — M810 Age-related osteoporosis without current pathological fracture: Secondary | ICD-10-CM

## 2015-06-27 DIAGNOSIS — E538 Deficiency of other specified B group vitamins: Secondary | ICD-10-CM

## 2015-06-27 DIAGNOSIS — I480 Paroxysmal atrial fibrillation: Secondary | ICD-10-CM | POA: Diagnosis not present

## 2015-06-27 DIAGNOSIS — G8929 Other chronic pain: Secondary | ICD-10-CM

## 2015-06-27 DIAGNOSIS — R300 Dysuria: Secondary | ICD-10-CM

## 2015-06-27 DIAGNOSIS — G459 Transient cerebral ischemic attack, unspecified: Secondary | ICD-10-CM | POA: Diagnosis not present

## 2015-06-27 DIAGNOSIS — M5440 Lumbago with sciatica, unspecified side: Secondary | ICD-10-CM | POA: Diagnosis not present

## 2015-06-27 NOTE — Assessment & Plan Note (Signed)
No relapse Dr Leta Baptist

## 2015-06-27 NOTE — Progress Notes (Signed)
Pre visit review using our clinic review tool, if applicable. No additional management support is needed unless otherwise documented below in the visit note. 

## 2015-06-27 NOTE — Assessment & Plan Note (Signed)
Resolved

## 2015-06-27 NOTE — Progress Notes (Signed)
Subjective:  Patient ID: Alyssa Luna, female    DOB: 1933-12-27  Age: 81 y.o. MRN: [id number]  CC: No chief complaint on file.   HPI Alyssa Luna presents for LBP - much better after a CT guided epidural. F/o osteoporosis - on Fosamax. F/u nausea - doing good now. F/u UTI - ok. F/u GERD - on Dexilant.  Outpatient Prescriptions Prior to Visit  Medication Sig Dispense Refill  . alendronate (FOSAMAX) 70 MG tablet Take 70 mg by mouth once a week. Take with a full glass of water on an empty stomach.    [name] amoxicillin (AMOXIL) 500 MG capsule TAKE 4 CAPSULES BY MOUTH ONE HOUR PRIOR TO DENTAL WORK  0  . buprenorphine (BUTRANS) 5 MCG/HR PTWK patch Place 1 patch (5 mcg total) onto the skin once a week. 4 patch 2  . carvedilol (COREG) 3.125 MG tablet Take 1 tablet (3.125 mg total) by mouth 2 (two) times daily. 180 tablet 3  . celecoxib (CELEBREX) 100 MG capsule TAKE 1 CAPSULE BY MOUTH EVERYDAY WITH FOOD AS NEEDED FOR PAIN AND SWELLING  2  . Cholecalciferol 1000 UNITS tablet Take 1,000 Units by mouth daily. D-3    . Cyanocobalamin (VITAMIN B-12 IJ) Inject as directed every 30 (thirty) days.    [name] Dexlansoprazole (DEXILANT) 30 MG capsule Take 1 capsule (30 mg total) by mouth daily. 90 capsule 3  . diazepam (VALIUM) 2 MG tablet Take 1 tablet (2 mg total) by mouth every 8 (eight) hours as needed for anxiety or muscle spasms (restless legs, dizziness). 60 tablet 3  . docusate sodium (COLACE) 100 MG capsule 1 tab 2 times a day while on narcotics.  STOOL SOFTENER 60 capsule 0  . flecainide (TAMBOCOR) 50 MG tablet Take 1 and 1/2 tablets by  mouth two times daily 270 tablet 1  . hydrocortisone 2.5 % cream Apply 1 application topically 3 (three) times daily as needed (itching).    [name] losartan (COZAAR) 50 MG tablet Take 0.5 tablets (25 mg total) by mouth daily. 30 tablet 5  . nystatin (MYCOSTATIN) 100000 UNIT/ML suspension Take 5 mLs (500,000 Units total) by mouth 4 (four) times daily. Swish, hold and  swallow 7-10 days 240 mL 0  . ondansetron (ZOFRAN) 4 MG tablet TAKE 1 TABLET BY MOUTH EVERY 8 HOURS AS NEEDED FOR NAUSEA OR VOMITING 30 tablet 0  . warfarin (COUMADIN) 3 MG tablet Take 3-4.5 mg by mouth daily. As directed    . nitrofurantoin, macrocrystal-monohydrate, (MACROBID) 100 MG capsule Take 1 capsule (100 mg total) by mouth 2 (two) times daily. (Patient not taking: Reported on 06/27/2015) 14 capsule 0   No facility-administered medications prior to visit.    ROS Review of Systems  Constitutional: Negative for chills, activity change, appetite change, fatigue and unexpected weight change.  HENT: Negative for congestion, mouth sores and sinus pressure.   Eyes: Negative for visual disturbance.  Respiratory: Negative for cough and chest tightness.   Gastrointestinal: Negative for nausea and abdominal pain.  Genitourinary: Negative for frequency, difficulty urinating and vaginal pain.  Musculoskeletal: Positive for back pain. Negative for gait problem.  Skin: Negative for pallor and rash.  Neurological: Negative for dizziness, tremors, weakness, numbness and headaches.  Psychiatric/Behavioral: Negative for confusion and sleep disturbance.    Objective:  BP 154/76 mmHg  Pulse 63  Resp 9  Wt 131 lb (59.421 kg)  SpO2 97%  BP Readings from Last 3 Encounters:  06/27/15 154/76  06/13/15 126/76  05/17/15 130/64  Wt Readings from Last 3 Encounters:  06/27/15 131 lb (59.421 kg)  06/13/15 135 lb (61.236 kg)  05/17/15 131 lb (59.421 kg)    Physical Exam  Constitutional: She appears well-developed. No distress.  HENT:  Head: Normocephalic.  Right Ear: External ear normal.  Left Ear: External ear normal.  Nose: Nose normal.  Mouth/Throat: Oropharynx is clear and moist.  Eyes: Conjunctivae are normal. Pupils are equal, round, and reactive to light. Right eye exhibits no discharge. Left eye exhibits no discharge.  Neck: Normal range of motion. Neck supple. No JVD present. No  tracheal deviation present. No thyromegaly present.  Cardiovascular: Normal rate, regular rhythm and normal heart sounds.   Pulmonary/Chest: No stridor. No respiratory distress. She has no wheezes.  Abdominal: Soft. Bowel sounds are normal. She exhibits no distension and no mass. There is no tenderness. There is no rebound and no guarding.  Musculoskeletal: She exhibits no edema or tenderness.  Lymphadenopathy:    She has no cervical adenopathy.  Neurological: She displays normal reflexes. No cranial nerve deficit. She exhibits normal muscle tone. Coordination normal.  Skin: No rash noted. No erythema.  Psychiatric: She has a normal mood and affect. Her behavior is normal. Judgment and thought content normal.    Lab Results  Component Value Date   WBC 3.9* 03/05/2015   HGB 12.1 03/05/2015   HCT 37.3 03/05/2015   PLT 216 03/05/2015   GLUCOSE 78 04/30/2015   CHOL 189 10/30/2014   TRIG 151.0* 10/30/2014   HDL 41.30 10/30/2014   LDLDIRECT 178.0 09/24/2011   LDLCALC 118* 10/30/2014   ALT 16 02/01/2015   AST 20 02/01/2015   NA 141 04/30/2015   K 4.0 04/30/2015   CL 105 04/30/2015   CREATININE 0.68 04/30/2015   BUN 10 04/30/2015   CO2 30 04/30/2015   TSH 1.991 02/01/2015   INR 1.8 06/04/2015    Mr Thoracic Spine Wo Contrast  06/18/2015  CLINICAL DATA:  Thoracic back pain. History of prior kyphoplasty August 2016. History of breast cancer in 1984. EXAM: MRI THORACIC SPINE WITHOUT CONTRAST TECHNIQUE: Multiplanar, multisequence MR imaging of the thoracic spine was performed. No intravenous contrast was administered. COMPARISON:  None. FINDINGS: There is a chronic T11 vertebral body compression fracture with approximately 50% anterior height loss there is 3 mm of retropulsion of the inferior posterior margin of the T11 vertebral body impressing on the thecal sac. Remote L1 vertebral body compression fracture with approximately 80% height loss. There is mild marrow edema along the superior  endplate. There is methylmethacrylate within the vertebral body from prior kyphoplasty. The remainder the vertebral body heights are maintained. The alignment is anatomic. There is no acute fracture or static listhesis. The marrow signal is otherwise normal. The thoracic spinal cord is normal in size and signal. The disc spaces are maintained. There is degenerative disc disease with disc osteophyte complexes at C5-6 and C6-7 partially visualized. T1-T2: No significant disc protrusion, foraminal stenosis or central canal stenosis. T2-T3: No significant disc protrusion, foraminal stenosis or central canal stenosis. T3-T4: No significant disc protrusion, foraminal stenosis or central canal stenosis. T4-T5: No significant disc protrusion, foraminal stenosis or central canal stenosis. T5-T6: No significant disc protrusion, foraminal stenosis or central canal stenosis. T6-T7: No significant disc protrusion, foraminal stenosis or central canal stenosis. T7-T8: No significant disc protrusion, foraminal stenosis or central canal stenosis. T8-T9: No significant disc protrusion, foraminal stenosis or central canal stenosis. T9-T10: No significant disc protrusion, foraminal stenosis or central canal stenosis. T10-T11:  No significant disc protrusion, foraminal stenosis or central canal stenosis. T11-T12: No significant disc protrusion, foraminal stenosis or central canal stenosis. T12-L1: Moderate size left paracentral disc protrusion impressing on the thecal sac. No foraminal or central canal stenosis. L2-3:  Mild broad-based disc bulge. IMPRESSION: 1. Remote L1 vertebral body compression fracture with approximately 80% height loss. There is mild marrow edema along the superior endplate. There is methylmethacrylate within the vertebral body from prior kyphoplasty. 2. Chronic T11 vertebral body compression fracture with approximately 50% anterior height loss there is 3 mm of retropulsion of the inferior posterior margin of the  T11 vertebral body impressing on the thecal sac. 3. At T12-L1, there is a moderate size left paracentral disc protrusion impressing on the thecal sac. Electronically Signed   By: Kathreen Devoid   On: 06/18/2015 13:48    Assessment & Plan:   Diagnoses and all orders for this visit:  Paroxysmal atrial fibrillation ([place])  Transient cerebral ischemia, unspecified transient cerebral ischemia type  B12 deficiency  Chronic midline low back pain with sciatica, sciatica laterality unspecified  Dysuria  Osteoporosis   I have discontinued Ms. Butz's nitrofurantoin (macrocrystal-monohydrate). I am also having her maintain her Cholecalciferol, docusate sodium, Cyanocobalamin (VITAMIN B-12 IJ), warfarin, hydrocortisone, losartan, diazepam, nystatin, alendronate, flecainide, celecoxib, amoxicillin, buprenorphine, carvedilol, Dexlansoprazole, and ondansetron.  No orders of the defined types were placed in this encounter.     Follow-up: Return in about 3 months (around 09/27/2015) for a follow-up visit.  Walker Kehr, MD

## 2015-06-27 NOTE — Assessment & Plan Note (Signed)
Pt is planning to see an endocrinologist at [facility]

## 2015-06-27 NOTE — Assessment & Plan Note (Signed)
11/16 s/p Epidural at [facility]

## 2015-06-27 NOTE — Assessment & Plan Note (Signed)
On Flecanide, Coreg, On Coumadin

## 2015-06-27 NOTE — Assessment & Plan Note (Signed)
On B12 

## 2015-07-02 ENCOUNTER — Ambulatory Visit (INDEPENDENT_AMBULATORY_CARE_PROVIDER_SITE_OTHER): Payer: Medicare Other | Admitting: Internal Medicine

## 2015-07-02 ENCOUNTER — Ambulatory Visit (INDEPENDENT_AMBULATORY_CARE_PROVIDER_SITE_OTHER): Payer: Medicare Other | Admitting: General Practice

## 2015-07-02 ENCOUNTER — Encounter: Payer: Self-pay | Admitting: Internal Medicine

## 2015-07-02 VITALS — BP 130/70 | HR 64 | Ht 63.0 in | Wt 130.4 lb

## 2015-07-02 DIAGNOSIS — I10 Essential (primary) hypertension: Secondary | ICD-10-CM

## 2015-07-02 DIAGNOSIS — G459 Transient cerebral ischemic attack, unspecified: Secondary | ICD-10-CM

## 2015-07-02 DIAGNOSIS — I4891 Unspecified atrial fibrillation: Secondary | ICD-10-CM | POA: Diagnosis not present

## 2015-07-02 DIAGNOSIS — I480 Paroxysmal atrial fibrillation: Secondary | ICD-10-CM | POA: Diagnosis not present

## 2015-07-02 LAB — POCT INR: INR: 1.7

## 2015-07-02 MED ORDER — APIXABAN 5 MG PO TABS: 5.0000 mg | ORAL_TABLET | Freq: Two times a day (BID) | ORAL | Status: DC

## 2015-07-02 NOTE — Patient Instructions (Addendum)
Medication Instructions:  Your physician has recommended you make the following change in your medication:  1) STOP Coumadin 2) START Eliquis 5 mg twice a day  Labwork: None ordered  Testing/Procedures: None ordered  Follow-Up: Your physician wants you to follow-up in: 6 months with Dr. Lovena Le.  You will receive a reminder letter in the mail two months in advance. If you don't receive a letter, please call our office to schedule the follow-up appointment.   If you need a refill on your cardiac medications before your next appointment, please call your pharmacy.  Thank you for choosing [facility]!!

## 2015-07-02 NOTE — Assessment & Plan Note (Signed)
She will stop coumadin and switch to Eliquis as she has been supra and subtherapeutic despite dosing warfarin in our coumadin clinic. She will continue low dose flecainide.

## 2015-07-02 NOTE — Progress Notes (Signed)
HPI  Alyssa Luna returns today for followup. She is a pleasant 81 yo woman with a h/o PAF who has been well controlled on flecainide and a beta blocker. She has undergone knee surgery, and this was complicated by hematoma in the setting of an elevated INR. She is just now getting better. During this time she has had no symptomatic atrial fibrillation. She would like to stop warfarin. No other significant complaints today. She has had trouble with Eliquis in the past due to cost.  Allergies  Allergen Reactions  . Pravastatin Anaphylaxis  . Actonel [Risedronate Sodium] Other (See Comments)    Not known  . Augmentin [Amoxicillin-Pot Clavulanate] Rash    rash  . Ciprofloxacin Nausea Only  . Evista [Raloxifene] Other (See Comments)    UNKNOWN  . Flagyl [Metronidazole] Other (See Comments)    Not known  . Fosamax [Alendronate Sodium] Other (See Comments)    NOT KNOWN  . Gold-Containing Drug Products Other (See Comments)    Per Dr  . Loma Sousa [Escitalopram Oxalate] Other (See Comments)    shaky  . Simvastatin Other (See Comments)    REACTION: leg cramps, weakness  . Tramadol Other (See Comments)    Watery eyes     Current Outpatient Prescriptions  Medication Sig Dispense Refill  . alendronate (FOSAMAX) 70 MG tablet Take 70 mg by mouth once a week. Take with a full glass of water on an empty stomach.    [name] amoxicillin (AMOXIL) 500 MG capsule TAKE 4 CAPSULES BY MOUTH ONE HOUR PRIOR TO DENTAL WORK  0  . carvedilol (COREG) 3.125 MG tablet Take 1 tablet (3.125 mg total) by mouth 2 (two) times daily. 180 tablet 3  . celecoxib (CELEBREX) 100 MG capsule TAKE 1 CAPSULE BY MOUTH EVERYDAY WITH FOOD AS NEEDED FOR PAIN AND SWELLING  2  . Cholecalciferol 1000 UNITS tablet Take 1,000 Units by mouth daily. D-3    . Cyanocobalamin (VITAMIN B-12 IJ) Inject as directed every 30 (thirty) days.    [name] Dexlansoprazole (DEXILANT) 30 MG capsule Take 1 capsule (30 mg total) by mouth daily. 90 capsule 3  . diazepam  (VALIUM) 2 MG tablet Take 1 tablet (2 mg total) by mouth every 8 (eight) hours as needed for anxiety or muscle spasms (restless legs, dizziness). 60 tablet 3  . flecainide (TAMBOCOR) 50 MG tablet Take 1 and 1/2 tablets by  mouth two times daily (Patient taking differently: Take 1  tablet by  mouth two times daily) 270 tablet 1  . hydrocortisone 2.5 % cream Apply 1 application topically 3 (three) times daily as needed (itching).    [name] losartan (COZAAR) 50 MG tablet Take 0.5 tablets (25 mg total) by mouth daily. 30 tablet 5  . nystatin (MYCOSTATIN) 100000 UNIT/ML suspension Take 5 mLs (500,000 Units total) by mouth 4 (four) times daily. Swish, hold and swallow 7-10 days 240 mL 0  . ondansetron (ZOFRAN) 4 MG tablet TAKE 1 TABLET BY MOUTH EVERY 8 HOURS AS NEEDED FOR NAUSEA OR VOMITING 30 tablet 0  . warfarin (COUMADIN) 3 MG tablet Take 3-4.5 mg by mouth daily. As directed     No current facility-administered medications for this visit.     Past Medical History  Diagnosis Date  . Colon polyps   . Diverticulosis of colon   . GERD (gastroesophageal reflux disease)   . Osteoporosis   . TR (tricuspid regurgitation)     Mild  . Vitamin B12 deficiency   . Vitamin D deficiency   .  History of breast cancer   . Hyperlipidemia   . Familial tremor   . LBP (low back pain)   . Cataract   . Atrial fibrillation ([place])     D Karis Emig  . TIA (transient ischemic attack)   . Hemorrhoids   . Breast cancer ([place]) 1984  . Hypertension   . Dysrhythmia     atrial fib  . Anxiety     has lorazepam on hand for nervousness, pt. reports that she had a break-in to her home on 05/2014  . History of hiatal hernia   . Neuromuscular disorder ([place])     essential tremor  . Osteoarthritis     hands & back & knees   . Primary localized osteoarthritis of left knee   . Coronary artery disease   . AF (atrial fibrillation) ([place])   . Stroke ([place])     ROS:   All systems reviewed and negative except as noted in the  HPI.   Past Surgical History  Procedure Laterality Date  . Breast enhancement surgery    . Cataract extraction    . Total knee arthroplasty  Dec 2011    Right - Dr Noemi Chapel  . Bilateral mastectomy    . Polypectomy    . Colonoscopy    . Vaginal hysterectomy      [facility] BSO  . Oophorectomy      BSO  . Breast surgery      Bilateral mastectomy  . Augmentation mammaplasty    . Mastectomy  1984    bilateral  . Eye surgery      cataracts removed- /w iol  & blepheroplasty  . Joint replacement Right   . Total knee arthroplasty Left 08/27/2014    dr Noemi Chapel  . Total knee arthroplasty Left 08/27/2014    Procedure: LEFT TOTAL KNEE ARTHROPLASTY;  Surgeon: Lorn Junes, MD;  Location: [facility];  Service: Orthopedics;  Laterality: Left;     Family History  Problem Relation Age of Onset  . Early death Neg Hx   . Stroke Neg Hx   . Breast cancer Mother 13  . Tremor Mother   . Tremor Brother   . Colon cancer Maternal Aunt   . Ovarian cancer Maternal Grandmother      Social History   Social History  . Marital Status: Single    Spouse Name: N/A  . Number of Children: 0  . Years of Education: College   Occupational History  . [employer], Retired    Social History Main Topics  . Smoking status: Never Smoker   . Smokeless tobacco: Never Used  . Alcohol Use: No  . Drug Use: No  . Sexual Activity: No     Comment: HYST   Other Topics Concern  . Not on file   Social History Narrative   ** Merged History Encounter **       Regular Exercise -  NO           BP 130/70 mmHg  Pulse 64  Ht 5' 3"  (1.6 m)  Wt 130 lb 6.4 oz (59.149 kg)  BMI 23.11 kg/m2  SpO2 96%  Physical Exam:  Well appearing 81 year-old woman, NAD HEENT: Unremarkable Neck:  No JVD, no thyromegally Back:  No CVA tenderness Lungs:  Clear with no wheezes, rales, or rhonchi. HEART:  IRegular rate rhythm, no murmurs, no rubs, no clicks Abd:  soft, positive bowel sounds, no organomegally, no rebound, no  guarding Ext:  2 plus pulses, no  edema, no cyanosis, no clubbing, left knee is healing well but still swollen Skin:  No rashes no nodules Neuro:  CN II through XII intact, motor grossly intact  ECG - normal sinus rhythm  Assess/Plan:

## 2015-07-02 NOTE — Assessment & Plan Note (Signed)
She will continue her beta blocker. She will maintain a low sodium diet.

## 2015-07-02 NOTE — Progress Notes (Signed)
Pre visit review using our clinic review tool, if applicable. No additional management support is needed unless otherwise documented below in the visit note. 

## 2015-07-02 NOTE — Progress Notes (Signed)
I have reviewed and agree with the plan.

## 2015-07-02 NOTE — Assessment & Plan Note (Signed)
She has PAF and will need to remain on anti-coagulation.

## 2015-07-02 NOTE — Addendum Note (Signed)
Addended by: Stanton Kidney on: 07/02/2015 02:32 PM   Modules accepted: Orders, Medications

## 2015-07-03 ENCOUNTER — Telehealth: Payer: Self-pay

## 2015-07-03 ENCOUNTER — Ambulatory Visit (INDEPENDENT_AMBULATORY_CARE_PROVIDER_SITE_OTHER): Payer: Medicare Other | Admitting: Internal Medicine

## 2015-07-03 ENCOUNTER — Encounter: Payer: Self-pay | Admitting: Internal Medicine

## 2015-07-03 VITALS — BP 130/68 | HR 56 | Temp 98.9°F | Wt 130.0 lb

## 2015-07-03 DIAGNOSIS — I480 Paroxysmal atrial fibrillation: Secondary | ICD-10-CM | POA: Diagnosis not present

## 2015-07-03 DIAGNOSIS — B370 Candidal stomatitis: Secondary | ICD-10-CM

## 2015-07-03 MED ORDER — FLUCONAZOLE 100 MG PO TABS: ORAL_TABLET | ORAL | Status: DC

## 2015-07-03 NOTE — Assessment & Plan Note (Signed)
Pt is considering switching to Eliquis

## 2015-07-03 NOTE — Assessment & Plan Note (Signed)
Diflucan x10 d

## 2015-07-03 NOTE — Progress Notes (Signed)
Subjective:  Patient ID: Alyssa Luna, female    DOB: 1933-12-03  Age: 81 y.o. MRN: [id number]  CC: No chief complaint on file.   HPI Alyssa Luna presents for ST, mouth sores and ulcers x 1 week  Outpatient Prescriptions Prior to Visit  Medication Sig Dispense Refill  . amoxicillin (AMOXIL) 500 MG capsule TAKE 4 CAPSULES BY MOUTH ONE HOUR PRIOR TO DENTAL WORK  0  . apixaban (ELIQUIS) 5 MG TABS tablet Take 1 tablet (5 mg total) by mouth 2 (two) times daily. 60 tablet 6  . carvedilol (COREG) 3.125 MG tablet Take 1 tablet (3.125 mg total) by mouth 2 (two) times daily. 180 tablet 3  . celecoxib (CELEBREX) 100 MG capsule TAKE 1 CAPSULE BY MOUTH EVERYDAY WITH FOOD AS NEEDED FOR PAIN AND SWELLING  2  . Cholecalciferol 1000 UNITS tablet Take 1,000 Units by mouth daily. D-3    . Cyanocobalamin (VITAMIN B-12 IJ) Inject as directed every 30 (thirty) days.    [name] Dexlansoprazole (DEXILANT) 30 MG capsule Take 1 capsule (30 mg total) by mouth daily. 90 capsule 3  . diazepam (VALIUM) 2 MG tablet Take 1 tablet (2 mg total) by mouth every 8 (eight) hours as needed for anxiety or muscle spasms (restless legs, dizziness). 60 tablet 3  . flecainide (TAMBOCOR) 50 MG tablet Take 1 and 1/2 tablets by  mouth two times daily (Patient taking differently: Take 1  tablet by  mouth two times daily) 270 tablet 1  . hydrocortisone 2.5 % cream Apply 1 application topically 3 (three) times daily as needed (itching).    [name] losartan (COZAAR) 50 MG tablet Take 0.5 tablets (25 mg total) by mouth daily. 30 tablet 5  . nystatin (MYCOSTATIN) 100000 UNIT/ML suspension Take 5 mLs (500,000 Units total) by mouth 4 (four) times daily. Swish, hold and swallow 7-10 days 240 mL 0  . ondansetron (ZOFRAN) 4 MG tablet TAKE 1 TABLET BY MOUTH EVERY 8 HOURS AS NEEDED FOR NAUSEA OR VOMITING 30 tablet 0  . alendronate (FOSAMAX) 70 MG tablet Take 70 mg by mouth once a week. Take with a full glass of water on an empty stomach.     No  facility-administered medications prior to visit.    ROS Review of Systems  Constitutional: Negative for fever.  HENT: Positive for mouth sores and sore throat.   Gastrointestinal: Negative for nausea and abdominal pain.   sore mouth   Objective:  BP 130/68 mmHg  Pulse 56  Temp(Src) 98.9 F (37.2 C) (Oral)  Wt 130 lb (58.968 kg)  SpO2 95%  BP Readings from Last 3 Encounters:  07/03/15 130/68  07/02/15 130/70  06/27/15 154/76    Wt Readings from Last 3 Encounters:  07/03/15 130 lb (58.968 kg)  07/02/15 130 lb 6.4 oz (59.149 kg)  06/27/15 131 lb (59.421 kg)    Physical Exam  Pulmonary/Chest: She has no rales.  Abdominal: There is no tenderness.  Musculoskeletal: She exhibits no edema.  Skin: No rash noted.  Thrush/erythema in the oral cavity  Lab Results  Component Value Date   WBC 3.9* 03/05/2015   HGB 12.1 03/05/2015   HCT 37.3 03/05/2015   PLT 216 03/05/2015   GLUCOSE 78 04/30/2015   CHOL 189 10/30/2014   TRIG 151.0* 10/30/2014   HDL 41.30 10/30/2014   LDLDIRECT 178.0 09/24/2011   LDLCALC 118* 10/30/2014   ALT 16 02/01/2015   AST 20 02/01/2015   NA 141 04/30/2015   K 4.0 04/30/2015  CL 105 04/30/2015   CREATININE 0.68 04/30/2015   BUN 10 04/30/2015   CO2 30 04/30/2015   TSH 1.991 02/01/2015   INR 1.7 07/02/2015    Mr Thoracic Spine Wo Contrast  06/18/2015  CLINICAL DATA:  Thoracic back pain. History of prior kyphoplasty August 2016. History of breast cancer in 1984. EXAM: MRI THORACIC SPINE WITHOUT CONTRAST TECHNIQUE: Multiplanar, multisequence MR imaging of the thoracic spine was performed. No intravenous contrast was administered. COMPARISON:  None. FINDINGS: There is a chronic T11 vertebral body compression fracture with approximately 50% anterior height loss there is 3 mm of retropulsion of the inferior posterior margin of the T11 vertebral body impressing on the thecal sac. Remote L1 vertebral body compression fracture with approximately 80%  height loss. There is mild marrow edema along the superior endplate. There is methylmethacrylate within the vertebral body from prior kyphoplasty. The remainder the vertebral body heights are maintained. The alignment is anatomic. There is no acute fracture or static listhesis. The marrow signal is otherwise normal. The thoracic spinal cord is normal in size and signal. The disc spaces are maintained. There is degenerative disc disease with disc osteophyte complexes at C5-6 and C6-7 partially visualized. T1-T2: No significant disc protrusion, foraminal stenosis or central canal stenosis. T2-T3: No significant disc protrusion, foraminal stenosis or central canal stenosis. T3-T4: No significant disc protrusion, foraminal stenosis or central canal stenosis. T4-T5: No significant disc protrusion, foraminal stenosis or central canal stenosis. T5-T6: No significant disc protrusion, foraminal stenosis or central canal stenosis. T6-T7: No significant disc protrusion, foraminal stenosis or central canal stenosis. T7-T8: No significant disc protrusion, foraminal stenosis or central canal stenosis. T8-T9: No significant disc protrusion, foraminal stenosis or central canal stenosis. T9-T10: No significant disc protrusion, foraminal stenosis or central canal stenosis. T10-T11: No significant disc protrusion, foraminal stenosis or central canal stenosis. T11-T12: No significant disc protrusion, foraminal stenosis or central canal stenosis. T12-L1: Moderate size left paracentral disc protrusion impressing on the thecal sac. No foraminal or central canal stenosis. L2-3:  Mild broad-based disc bulge. IMPRESSION: 1. Remote L1 vertebral body compression fracture with approximately 80% height loss. There is mild marrow edema along the superior endplate. There is methylmethacrylate within the vertebral body from prior kyphoplasty. 2. Chronic T11 vertebral body compression fracture with approximately 50% anterior height loss there is 3  mm of retropulsion of the inferior posterior margin of the T11 vertebral body impressing on the thecal sac. 3. At T12-L1, there is a moderate size left paracentral disc protrusion impressing on the thecal sac. Electronically Signed   By: Kathreen Devoid   On: 06/18/2015 13:48    Assessment & Plan:   There are no diagnoses linked to this encounter. I am having Ms. Wirsing maintain her Cholecalciferol, Cyanocobalamin (VITAMIN B-12 IJ), hydrocortisone, losartan, diazepam, nystatin, alendronate, flecainide, celecoxib, amoxicillin, carvedilol, Dexlansoprazole, ondansetron, and apixaban.  No orders of the defined types were placed in this encounter.     Follow-up: No Follow-up on file.  Walker Kehr, MD

## 2015-07-03 NOTE — Telephone Encounter (Signed)
Prior auth for Eliquis 5 mg sent to [facility]

## 2015-07-03 NOTE — Progress Notes (Signed)
Pre visit review using our clinic review tool, if applicable. No additional management support is needed unless otherwise documented below in the visit note. 

## 2015-07-04 ENCOUNTER — Telehealth: Payer: Self-pay

## 2015-07-04 NOTE — Telephone Encounter (Signed)
Prior auth obtained for Eliquis 58m from [facility]. [id number] Good through 07/02/2016. Patient informed.

## 2015-07-16 ENCOUNTER — Ambulatory Visit (INDEPENDENT_AMBULATORY_CARE_PROVIDER_SITE_OTHER): Payer: Medicare Other | Admitting: General Practice

## 2015-07-16 ENCOUNTER — Telehealth: Payer: Self-pay | Admitting: Internal Medicine

## 2015-07-16 DIAGNOSIS — I4891 Unspecified atrial fibrillation: Secondary | ICD-10-CM | POA: Diagnosis not present

## 2015-07-16 LAB — POCT INR: INR: 2.8

## 2015-07-16 NOTE — Progress Notes (Signed)
I have reviewed and agree with the plan.

## 2015-07-16 NOTE — Telephone Encounter (Signed)
I"m sorry! I got this message at 9:25 pm Stop thrush medication. Use Maalox or Mylanta or Gaviscon qid Thx

## 2015-07-16 NOTE — Telephone Encounter (Signed)
Pt was put on medication for thrush and now her throat is sore, voice is hoarse, esophagus is burning and has nausea when she eats.  I scheduled her for Thursday. She is coming in today at 11:15 for the coumadin clinic and is wondering if you could fit her in before her appt.  Please advise

## 2015-07-16 NOTE — Progress Notes (Signed)
Pre visit review using our clinic review tool, if applicable. No additional management support is needed unless otherwise documented below in the visit note. 

## 2015-07-17 ENCOUNTER — Ambulatory Visit (INDEPENDENT_AMBULATORY_CARE_PROVIDER_SITE_OTHER): Payer: Medicare Other | Admitting: Internal Medicine

## 2015-07-17 VITALS — BP 112/60 | HR 72 | Temp 97.7°F | Ht 62.0 in | Wt 129.0 lb

## 2015-07-17 DIAGNOSIS — Z7901 Long term (current) use of anticoagulants: Secondary | ICD-10-CM | POA: Diagnosis not present

## 2015-07-17 DIAGNOSIS — M810 Age-related osteoporosis without current pathological fracture: Secondary | ICD-10-CM

## 2015-07-17 DIAGNOSIS — R110 Nausea: Secondary | ICD-10-CM | POA: Diagnosis not present

## 2015-07-17 MED ORDER — OMEPRAZOLE 20 MG PO CPDR: 40.0000 mg | DELAYED_RELEASE_CAPSULE | Freq: Every day | ORAL | Status: DC

## 2015-07-17 NOTE — Patient Instructions (Signed)
OK to stop the fosamax as you have  OK to increase the prilosec from 20 to 40 mg per day  You can also take occasional TUMS if needed  OK for prunes for fruit if you like, as this low acid, and helps with constipation  OK to take the zofran, as this is for nausea that is not related to chemotherapy as well  OK to take the Eliquis, but please coordinate stopping your coumadin with [facility]/coumadin clinic when you do this  Please make sure Dr Alain Marion considers the next step for the tongue burning  Please follow up with you Rheumatologist about the Prolia, though Dr Alain Marion can arrange this as well in this office  Please continue all other medications as before, and refills have been done if requested.  Please have the pharmacy call with any other refills you may need.  Please keep your appointments with your specialists as you may have planned

## 2015-07-17 NOTE — Progress Notes (Signed)
Subjective:    Patient ID: Alyssa Luna, female    DOB: Jan 15, 1934, 81 y.o.   MRN: [id number]  HPI  Here with c/o nausea , thinks ? Related to med for cystitis, or diflucan finished 3 days ago after tx per Dr Alain Marion, had been taken off fosamax due to GI upset, then restarted as a second trial due to the osteoporosis. Still has ST , sore tongue despite the diflucan tx.  Has had zofran rx but did not take since the information with it mentioned chemotherapy so she wasn't sure it was a good idea.  Also taking dexilant qhs, but heart raced so tried to take in the AM with the same thing. Now went back to prilosec.  Denies worsening reflux, abd pain, dysphagia, n/v, bowel change or blood., but does have bloating, belching.  Also mentions ongoing constipation with "low acid" diet, wants to go back to more fruit in the diet as this seemed less constipating than the meat and potatoes. Has been on other stable meds including coreg and flecainide since 2011 without nausea or other.  Has a rx for eliquis instead of coumadin but hesitates due to current issues. S/p ESI to back at [facility] recently, but still Pt continues to have recurring LBP without change in severity, bowel or bladder change, fever, wt loss,  worsening LE pain/numbness/weakness, gait change or falls. Past Medical History  Diagnosis Date  . Colon polyps   . Diverticulosis of colon   . GERD (gastroesophageal reflux disease)   . Osteoporosis   . TR (tricuspid regurgitation)     Mild  . Vitamin B12 deficiency   . Vitamin D deficiency   . History of breast cancer   . Hyperlipidemia   . Familial tremor   . LBP (low back pain)   . Cataract   . Atrial fibrillation ([place])     D Taylor  . TIA (transient ischemic attack)   . Hemorrhoids   . Breast cancer ([place]) 1984  . Hypertension   . Dysrhythmia     atrial fib  . Anxiety     has lorazepam on hand for nervousness, pt. reports that she had a break-in to her home on 05/2014  . History of  hiatal hernia   . Neuromuscular disorder ([place])     essential tremor  . Osteoarthritis     hands & back & knees   . Primary localized osteoarthritis of left knee   . Coronary artery disease   . AF (atrial fibrillation) ([place])   . Stroke [facility])    Past Surgical History  Procedure Laterality Date  . Breast enhancement surgery    . Cataract extraction    . Total knee arthroplasty  Dec 2011    Right - Dr Noemi Chapel  . Bilateral mastectomy    . Polypectomy    . Colonoscopy    . Vaginal hysterectomy      [facility] BSO  . Oophorectomy      BSO  . Breast surgery      Bilateral mastectomy  . Augmentation mammaplasty    . Mastectomy  1984    bilateral  . Eye surgery      cataracts removed- /w iol  & blepheroplasty  . Joint replacement Right   . Total knee arthroplasty Left 08/27/2014    dr Noemi Chapel  . Total knee arthroplasty Left 08/27/2014    Procedure: LEFT TOTAL KNEE ARTHROPLASTY;  Surgeon: Lorn Junes, MD;  Location: [facility];  Service: Orthopedics;  Laterality: Left;    reports that she has never smoked. She has never used smokeless tobacco. She reports that she does not drink alcohol or use illicit drugs. family history includes Breast cancer (age of onset: 77) in her mother; Colon cancer in her maternal aunt; Ovarian cancer in her maternal grandmother; Tremor in her brother and mother. There is no history of Early death or Stroke. Allergies  Allergen Reactions  . Pravastatin Anaphylaxis  . Actonel [Risedronate Sodium] Other (See Comments)    Not known  . Augmentin [Amoxicillin-Pot Clavulanate] Rash    rash  . Ciprofloxacin Nausea Only  . Evista [Raloxifene] Other (See Comments)    UNKNOWN  . Flagyl [Metronidazole] Other (See Comments)    Not known  . Fosamax [Alendronate Sodium] Other (See Comments)    NOT KNOWN  . Gold-Containing Drug Products Other (See Comments)    Per Dr  . Loma Sousa [Escitalopram Oxalate] Other (See Comments)    shaky  . Simvastatin Other (See Comments)     REACTION: leg cramps, weakness  . Tramadol Other (See Comments)    Watery eyes  ] Current Outpatient Prescriptions on File Prior to Visit  Medication Sig Dispense Refill  . amoxicillin (AMOXIL) 500 MG capsule TAKE 4 CAPSULES BY MOUTH ONE HOUR PRIOR TO DENTAL WORK  0  . apixaban (ELIQUIS) 5 MG TABS tablet Take 1 tablet (5 mg total) by mouth 2 (two) times daily. 60 tablet 6  . carvedilol (COREG) 3.125 MG tablet Take 1 tablet (3.125 mg total) by mouth 2 (two) times daily. 180 tablet 3  . celecoxib (CELEBREX) 100 MG capsule TAKE 1 CAPSULE BY MOUTH EVERYDAY WITH FOOD AS NEEDED FOR PAIN AND SWELLING  2  . Cyanocobalamin (VITAMIN B-12 IJ) Inject as directed every 30 (thirty) days.    . diazepam (VALIUM) 2 MG tablet Take 1 tablet (2 mg total) by mouth every 8 (eight) hours as needed for anxiety or muscle spasms (restless legs, dizziness). 60 tablet 3  . flecainide (TAMBOCOR) 50 MG tablet Take 1 and 1/2 tablets by  mouth two times daily (Patient taking differently: Take 1  tablet by  mouth two times daily) 270 tablet 1  . hydrocortisone 2.5 % cream Apply 1 application topically 3 (three) times daily as needed (itching).    [name] losartan (COZAAR) 50 MG tablet Take 0.5 tablets (25 mg total) by mouth daily. 30 tablet 5  . ondansetron (ZOFRAN) 4 MG tablet TAKE 1 TABLET BY MOUTH EVERY 8 HOURS AS NEEDED FOR NAUSEA OR VOMITING 30 tablet 0  . alendronate (FOSAMAX) 70 MG tablet Take 70 mg by mouth once a week. Take with a full glass of water on an empty stomach.    . Cholecalciferol 1000 UNITS tablet Take 1,000 Units by mouth daily. D-3    . Dexlansoprazole (DEXILANT) 30 MG capsule Take 1 capsule (30 mg total) by mouth daily. (Patient not taking: Reported on 07/17/2015) 90 capsule 3  . fluconazole (DIFLUCAN) 100 MG tablet Take 2 tabs on day#1, then 1 tab daily on Days #2-10 (Patient not taking: Reported on 07/17/2015) 11 tablet 1   No current facility-administered medications on file prior to visit.   Review of  Systems  Constitutional: Negative for unusual diaphoresis or night sweats HENT: Negative for ringing in ear or discharge Eyes: Negative for double vision or worsening visual disturbance.  Respiratory: Negative for choking and stridor.   Gastrointestinal: Negative for vomiting or other signifcant bowel change Genitourinary: Negative for hematuria or change in  urine volume.  Musculoskeletal: Negative for other MSK pain or swelling Skin: Negative for color change and worsening wound.  Neurological: Negative for tremors and numbness other than noted  Psychiatric/Behavioral: Negative for decreased concentration or agitation other than above       Objective:   Physical Exam BP 112/60 mmHg  Pulse 72  Temp(Src) 97.7 F (36.5 C) (Oral)  Ht 5' 2"  (1.575 m)  Wt 129 lb (58.514 kg)  BMI 23.59 kg/m2  SpO2 96% VS noted,  Constitutional: Pt appears in no significant distress HENT: Head: NCAT.  Right Ear: External ear normal.  Left Ear: External ear normal.  Eyes: . Pupils are equal, round, and reactive to light. Conjunctivae and EOM are normal Neck: Normal range of motion. Neck supple.  Cardiovascular: Normal rate and regular rhythm.   Pulmonary/Chest: Effort normal and breath sounds without rales or wheezing.  Abd:  Soft, NT, ND, + BS Neurological: Pt is alert. Not confused , motor grossly intact Skin: Skin is warm. No rash, no LE edema Psychiatric: Pt behavior is normal. No agitation.     Assessment & Plan:

## 2015-07-17 NOTE — Telephone Encounter (Signed)
Left detailed mess informing pt of below.  

## 2015-07-17 NOTE — Progress Notes (Signed)
Pre visit review using our clinic review tool, if applicable. No additional management support is needed unless otherwise documented below in the visit note. 

## 2015-07-18 ENCOUNTER — Ambulatory Visit: Payer: Medicare Other | Admitting: Internal Medicine

## 2015-07-18 DIAGNOSIS — Z0289 Encounter for other administrative examinations: Secondary | ICD-10-CM

## 2015-07-18 DIAGNOSIS — Z7901 Long term (current) use of anticoagulants: Secondary | ICD-10-CM | POA: Insufficient documentation

## 2015-07-18 NOTE — Assessment & Plan Note (Addendum)
?   Etiology, to d/c fosamax as appears may not be tolerating well, to increase the omeprazole generic to 20 mg x 2, TUMS prn ok, ok for zofran prn - pt reassured, f/u with Dr Alain Marion next wk as planned, ok for prunes as well for constipation if she wants fruit and better bowel habits as this may also help with nausea, o/w benign exam today, afeb, VSS, will hold on labs or imaging at this time  Note:  Total time for pt hx, exam, review of record with pt in the room, determination of diagnoses and plan for further eval and tx is > 40 min, with over 50% spent in coordination and counseling of patient

## 2015-07-18 NOTE — Assessment & Plan Note (Signed)
Pt may not be tolerating the fosamax again, suggested to stop to see if symtpoms improve, and consider prolia through this clinic or rheumatology

## 2015-07-18 NOTE — Assessment & Plan Note (Signed)
Pt reassured ok to change coumadin to eliquis as this should have no GI effects, but needs to coordinate with coumadin clinic with respect to stopping the coumadin

## 2015-07-22 ENCOUNTER — Telehealth: Payer: Self-pay | Admitting: Internal Medicine

## 2015-07-22 ENCOUNTER — Ambulatory Visit: Payer: Medicare Other | Admitting: Internal Medicine

## 2015-07-22 DIAGNOSIS — M810 Age-related osteoporosis without current pathological fracture: Secondary | ICD-10-CM

## 2015-07-22 NOTE — Telephone Encounter (Signed)
Patient called stating she has a critical situation with her spine according to her bone density and someone recommended her to endocrinologist Dr. Edmonia James or someone at his practice. His number is [phone]

## 2015-07-25 NOTE — Telephone Encounter (Signed)
Do I need to refer? Thx

## 2015-07-25 NOTE — Telephone Encounter (Signed)
Yes, please enter referral.

## 2015-07-25 NOTE — Telephone Encounter (Signed)
Ok Thx 

## 2015-07-26 ENCOUNTER — Ambulatory Visit (INDEPENDENT_AMBULATORY_CARE_PROVIDER_SITE_OTHER): Payer: Medicare Other | Admitting: Internal Medicine

## 2015-07-26 ENCOUNTER — Ambulatory Visit (INDEPENDENT_AMBULATORY_CARE_PROVIDER_SITE_OTHER): Payer: Medicare Other | Admitting: General Practice

## 2015-07-26 ENCOUNTER — Encounter: Payer: Self-pay | Admitting: Internal Medicine

## 2015-07-26 VITALS — BP 128/74 | HR 70 | Wt 130.0 lb

## 2015-07-26 DIAGNOSIS — I4891 Unspecified atrial fibrillation: Secondary | ICD-10-CM | POA: Diagnosis not present

## 2015-07-26 DIAGNOSIS — E538 Deficiency of other specified B group vitamins: Secondary | ICD-10-CM

## 2015-07-26 DIAGNOSIS — K219 Gastro-esophageal reflux disease without esophagitis: Secondary | ICD-10-CM | POA: Diagnosis not present

## 2015-07-26 LAB — POCT INR: INR: 1.9

## 2015-07-26 MED ORDER — ESOMEPRAZOLE MAGNESIUM 40 MG PO CPDR: 40.0000 mg | DELAYED_RELEASE_CAPSULE | Freq: Every day | ORAL | Status: DC

## 2015-07-26 MED ORDER — SUCRALFATE 1 GM/10ML PO SUSP: 1.0000 g | Freq: Three times a day (TID) | ORAL | Status: DC

## 2015-07-26 MED ORDER — CYANOCOBALAMIN 1000 MCG/ML IJ SOLN
1000.0000 ug | Freq: Once | INTRAMUSCULAR | Status: AC
  Administered 2015-07-26: 1000 ug via INTRAMUSCULAR

## 2015-07-26 MED ORDER — LINACLOTIDE 290 MCG PO CAPS: 290.0000 ug | ORAL_CAPSULE | Freq: Every day | ORAL | Status: DC

## 2015-07-26 NOTE — Progress Notes (Signed)
Pre visit review using our clinic review tool, if applicable. No additional management support is needed unless otherwise documented below in the visit note. 

## 2015-07-26 NOTE — Progress Notes (Signed)
Subjective:  Patient ID: Alyssa Luna, female    DOB: Apr 22, 1934  Age: 81 y.o. MRN: [id number]  CC: No chief complaint on file.   HPI LIVI MCGANN presents for esophageal burning. Pt stopped Fosamax 1 mo ago. ST and pain is better. C/o severe constipation. C/o nausea  Outpatient Prescriptions Prior to Visit  Medication Sig Dispense Refill  . amoxicillin (AMOXIL) 500 MG capsule TAKE 4 CAPSULES BY MOUTH ONE HOUR PRIOR TO DENTAL WORK  0  . carvedilol (COREG) 3.125 MG tablet Take 1 tablet (3.125 mg total) by mouth 2 (two) times daily. 180 tablet 3  . Cholecalciferol 1000 UNITS tablet Take 1,000 Units by mouth daily. D-3    . Cyanocobalamin (VITAMIN B-12 IJ) Inject as directed every 30 (thirty) days.    . diazepam (VALIUM) 2 MG tablet Take 1 tablet (2 mg total) by mouth every 8 (eight) hours as needed for anxiety or muscle spasms (restless legs, dizziness). 60 tablet 3  . flecainide (TAMBOCOR) 50 MG tablet Take 1 and 1/2 tablets by  mouth two times daily (Patient taking differently: Take 1  tablet by  mouth two times daily) 270 tablet 1  . hydrocortisone 2.5 % cream Apply 1 application topically 3 (three) times daily as needed (itching).    [name] losartan (COZAAR) 50 MG tablet Take 0.5 tablets (25 mg total) by mouth daily. 30 tablet 5  . ondansetron (ZOFRAN) 4 MG tablet TAKE 1 TABLET BY MOUTH EVERY 8 HOURS AS NEEDED FOR NAUSEA OR VOMITING 30 tablet 0  . celecoxib (CELEBREX) 100 MG capsule TAKE 1 CAPSULE BY MOUTH EVERYDAY WITH FOOD AS NEEDED FOR PAIN AND SWELLING  2  . omeprazole (PRILOSEC) 20 MG capsule Take 2 capsules (40 mg total) by mouth daily. 180 capsule 3  . apixaban (ELIQUIS) 5 MG TABS tablet Take 1 tablet (5 mg total) by mouth 2 (two) times daily. (Patient not taking: Reported on 07/26/2015) 60 tablet 6   No facility-administered medications prior to visit.    ROS Review of Systems  Constitutional: Negative for chills, activity change, appetite change, fatigue and unexpected  weight change.  HENT: Negative for congestion, mouth sores and sinus pressure.   Eyes: Negative for visual disturbance.  Respiratory: Negative for cough, chest tightness and shortness of breath.   Cardiovascular: Negative for palpitations.  Gastrointestinal: Positive for nausea. Negative for vomiting and abdominal pain.  Genitourinary: Negative for frequency, difficulty urinating and vaginal pain.  Musculoskeletal: Negative for back pain and gait problem.  Skin: Negative for pallor and rash.  Neurological: Negative for dizziness, tremors, weakness, numbness and headaches.  Psychiatric/Behavioral: Negative for confusion and sleep disturbance.  burning in the esophagus - a little better on Prilosec  Objective:  BP 128/74 mmHg  Pulse 70  Wt 130 lb (58.968 kg)  SpO2 93%  BP Readings from Last 3 Encounters:  07/26/15 128/74  07/17/15 112/60  07/03/15 130/68    Wt Readings from Last 3 Encounters:  07/26/15 130 lb (58.968 kg)  07/17/15 129 lb (58.514 kg)  07/03/15 130 lb (58.968 kg)    Physical Exam  Constitutional: She appears well-developed. No distress.  HENT:  Head: Normocephalic.  Right Ear: External ear normal.  Left Ear: External ear normal.  Nose: Nose normal.  Mouth/Throat: Oropharynx is clear and moist.  Eyes: Conjunctivae are normal. Pupils are equal, round, and reactive to light. Right eye exhibits no discharge. Left eye exhibits no discharge.  Neck: Normal range of motion. Neck supple. No JVD present.  No tracheal deviation present. No thyromegaly present.  Cardiovascular: Normal rate, regular rhythm and normal heart sounds.   Pulmonary/Chest: No stridor. No respiratory distress. She has no wheezes.  Abdominal: Soft. Bowel sounds are normal. She exhibits no distension and no mass. There is no tenderness. There is no rebound and no guarding.  Musculoskeletal: She exhibits no edema or tenderness.  Lymphadenopathy:    She has no cervical adenopathy.  Neurological: She  displays normal reflexes. No cranial nerve deficit. She exhibits normal muscle tone. Coordination normal.  Skin: No rash noted. No erythema.  Psychiatric: She has a normal mood and affect. Her behavior is normal. Judgment and thought content normal.    Lab Results  Component Value Date   WBC 3.9* 03/05/2015   HGB 12.1 03/05/2015   HCT 37.3 03/05/2015   PLT 216 03/05/2015   GLUCOSE 78 04/30/2015   CHOL 189 10/30/2014   TRIG 151.0* 10/30/2014   HDL 41.30 10/30/2014   LDLDIRECT 178.0 09/24/2011   LDLCALC 118* 10/30/2014   ALT 16 02/01/2015   AST 20 02/01/2015   NA 141 04/30/2015   K 4.0 04/30/2015   CL 105 04/30/2015   CREATININE 0.68 04/30/2015   BUN 10 04/30/2015   CO2 30 04/30/2015   TSH 1.991 02/01/2015   INR 1.9 07/26/2015    Mr Thoracic Spine Wo Contrast  06/18/2015  CLINICAL DATA:  Thoracic back pain. History of prior kyphoplasty August 2016. History of breast cancer in 1984. EXAM: MRI THORACIC SPINE WITHOUT CONTRAST TECHNIQUE: Multiplanar, multisequence MR imaging of the thoracic spine was performed. No intravenous contrast was administered. COMPARISON:  None. FINDINGS: There is a chronic T11 vertebral body compression fracture with approximately 50% anterior height loss there is 3 mm of retropulsion of the inferior posterior margin of the T11 vertebral body impressing on the thecal sac. Remote L1 vertebral body compression fracture with approximately 80% height loss. There is mild marrow edema along the superior endplate. There is methylmethacrylate within the vertebral body from prior kyphoplasty. The remainder the vertebral body heights are maintained. The alignment is anatomic. There is no acute fracture or static listhesis. The marrow signal is otherwise normal. The thoracic spinal cord is normal in size and signal. The disc spaces are maintained. There is degenerative disc disease with disc osteophyte complexes at C5-6 and C6-7 partially visualized. T1-T2: No significant disc  protrusion, foraminal stenosis or central canal stenosis. T2-T3: No significant disc protrusion, foraminal stenosis or central canal stenosis. T3-T4: No significant disc protrusion, foraminal stenosis or central canal stenosis. T4-T5: No significant disc protrusion, foraminal stenosis or central canal stenosis. T5-T6: No significant disc protrusion, foraminal stenosis or central canal stenosis. T6-T7: No significant disc protrusion, foraminal stenosis or central canal stenosis. T7-T8: No significant disc protrusion, foraminal stenosis or central canal stenosis. T8-T9: No significant disc protrusion, foraminal stenosis or central canal stenosis. T9-T10: No significant disc protrusion, foraminal stenosis or central canal stenosis. T10-T11: No significant disc protrusion, foraminal stenosis or central canal stenosis. T11-T12: No significant disc protrusion, foraminal stenosis or central canal stenosis. T12-L1: Moderate size left paracentral disc protrusion impressing on the thecal sac. No foraminal or central canal stenosis. L2-3:  Mild broad-based disc bulge. IMPRESSION: 1. Remote L1 vertebral body compression fracture with approximately 80% height loss. There is mild marrow edema along the superior endplate. There is methylmethacrylate within the vertebral body from prior kyphoplasty. 2. Chronic T11 vertebral body compression fracture with approximately 50% anterior height loss there is 3 mm of retropulsion of the  inferior posterior margin of the T11 vertebral body impressing on the thecal sac. 3. At T12-L1, there is a moderate size left paracentral disc protrusion impressing on the thecal sac. Electronically Signed   By: Kathreen Devoid   On: 06/18/2015 13:48    Assessment & Plan:   Diagnoses and all orders for this visit:  Gastroesophageal reflux disease, esophagitis presence not specified -     Ambulatory referral to Gastroenterology  B12 deficiency -     cyanocobalamin ((VITAMIN B-12)) injection 1,000  mcg; Inject 1 mL (1,000 mcg total) into the muscle once.  Other orders -     sucralfate (CARAFATE) 1 GM/10ML suspension; Take 10 mLs (1 g total) by mouth 4 (four) times daily -  with meals and at bedtime. -     esomeprazole (NEXIUM) 40 MG capsule; Take 1 capsule (40 mg total) by mouth daily. -     Linaclotide (LINZESS) 290 MCG CAPS capsule; Take 1 capsule (290 mcg total) by mouth daily.  I have discontinued Ms. Majchrzak's celecoxib and omeprazole. I am also having her start on sucralfate, esomeprazole, and Linaclotide. Additionally, I am having her maintain her Cholecalciferol, Cyanocobalamin (VITAMIN B-12 IJ), hydrocortisone, losartan, diazepam, flecainide, amoxicillin, carvedilol, ondansetron, and apixaban. We administered cyanocobalamin.  Meds ordered this encounter  Medications  . sucralfate (CARAFATE) 1 GM/10ML suspension    Sig: Take 10 mLs (1 g total) by mouth 4 (four) times daily -  with meals and at bedtime.    Dispense:  420 mL    Refill:  0  . esomeprazole (NEXIUM) 40 MG capsule    Sig: Take 1 capsule (40 mg total) by mouth daily.    Dispense:  30 capsule    Refill:  5  . Linaclotide (LINZESS) 290 MCG CAPS capsule    Sig: Take 1 capsule (290 mcg total) by mouth daily.    Dispense:  30 capsule    Refill:  11  . cyanocobalamin ((VITAMIN B-12)) injection 1,000 mcg    Sig:      Follow-up: Return in about 4 weeks (around 08/23/2015) for a follow-up visit.  Walker Kehr, MD

## 2015-07-26 NOTE — Progress Notes (Signed)
I have reviewed and agree with the plan.

## 2015-07-27 ENCOUNTER — Encounter: Payer: Self-pay | Admitting: Internal Medicine

## 2015-07-30 ENCOUNTER — Encounter: Payer: Self-pay | Admitting: Physician Assistant

## 2015-07-30 ENCOUNTER — Ambulatory Visit: Payer: Medicare Other | Admitting: Internal Medicine

## 2015-07-30 ENCOUNTER — Ambulatory Visit: Payer: Medicare Other

## 2015-07-30 ENCOUNTER — Ambulatory Visit (INDEPENDENT_AMBULATORY_CARE_PROVIDER_SITE_OTHER): Payer: Medicare Other | Admitting: Physician Assistant

## 2015-07-30 VITALS — BP 120/72 | HR 78 | Ht 62.0 in | Wt 128.4 lb

## 2015-07-30 DIAGNOSIS — K5901 Slow transit constipation: Secondary | ICD-10-CM

## 2015-07-30 DIAGNOSIS — K219 Gastro-esophageal reflux disease without esophagitis: Secondary | ICD-10-CM | POA: Diagnosis not present

## 2015-07-30 DIAGNOSIS — K146 Glossodynia: Secondary | ICD-10-CM

## 2015-07-30 MED ORDER — SUCRALFATE 1 GM/10ML PO SUSP: 1.0000 g | Freq: Three times a day (TID) | ORAL | Status: DC

## 2015-07-30 MED ORDER — MAGIC MOUTHWASH W/LIDOCAINE: 5.0000 mL | Freq: Four times a day (QID) | ORAL | Status: DC

## 2015-07-30 NOTE — Progress Notes (Signed)
Patient ID: Alyssa Luna, female   DOB: 09-23-1933, 81 y.o.   MRN: [id number]   Subjective:    Patient ID: Alyssa Luna, female    DOB: 06-26-1934, 81 y.o.   MRN: [id number]  HPI  [name]  is a pleasant 81 year old white female known to Dr.Pyrtle referred today by Dr. Alain Marion  with several GI complaints. She does have history of atrial fibrillation for which she is on Coumadin, history of diverticulosis, chronic GERD, constipation, colon polyps, and remote breast cancer. She has been having difficulty over the past several weeks with somewhat refractory GERD. She had been on Prilosec which generally works for her but started having more heartburn and indigestion. Last Friday she was started on Nexium which thus far she is tolerating and also was given a prescription for Carafate which she's not sure how to take. She is confused about several of her meds and wants to know in detail how to best take her meds. She also has a prescription for Lynn's S which she's not using is on Colace which is not working for her constipation. She has had a couple of episodes of thrush within the past year and says she still having some soreness of her tongue but does not have an altered taste and no dysphagia or odynophagia. She also mentions an increase in belching and burping over the past couple of weeks and nausea both of which seemed to have settled down since starting the Nexium. Last EGD was done in 2013 with finding of a Schatzki's ring which was dilated.  Review of Systems Pertinent positive and negative review of systems were noted in the above HPI section.  All other review of systems was otherwise negative.  Outpatient Encounter Prescriptions as of 07/30/2015  Medication Sig  . alum & mag hydroxide-simeth (MAALOX/MYLANTA) 200-200-20 MG/5ML suspension Take by mouth every 6 (six) hours as needed for indigestion or heartburn.  [name] amoxicillin (AMOXIL) 500 MG capsule TAKE 4 CAPSULES BY MOUTH ONE HOUR  PRIOR TO DENTAL WORK  . apixaban (ELIQUIS) 5 MG TABS tablet Take 1 tablet (5 mg total) by mouth 2 (two) times daily.  . Calcium & Magnesium Carbonates (MYLANTA PO) Take by mouth as needed.  . carvedilol (COREG) 3.125 MG tablet Take 1 tablet (3.125 mg total) by mouth 2 (two) times daily.  . Cholecalciferol 1000 UNITS tablet Take 1,000 Units by mouth daily. D-3  . Cyanocobalamin (VITAMIN B-12 IJ) Inject as directed every 30 (thirty) days.  . diazepam (VALIUM) 2 MG tablet Take 1 mg by mouth at bedtime.  Mariane Baumgarten Calcium (STOOL SOFTENER PO) Take 2 tablets by mouth 2 (two) times daily.  [name] esomeprazole (NEXIUM) 40 MG capsule Take 1 capsule (40 mg total) by mouth daily.  . flecainide (TAMBOCOR) 50 MG tablet Take 1 and 1/2 tablets by  mouth two times daily (Patient taking differently: Take 1  tablet by  mouth two times daily)  . hydrocortisone 2.5 % cream Apply 1 application topically 3 (three) times daily as needed (itching).  . Linaclotide (LINZESS) 290 MCG CAPS capsule Take 1 capsule (290 mcg total) by mouth daily. (Patient taking differently: Take 290 mcg by mouth daily as needed. )  . losartan (COZAAR) 50 MG tablet Take 0.5 tablets (25 mg total) by mouth daily.  . ondansetron (ZOFRAN) 4 MG tablet TAKE 1 TABLET BY MOUTH EVERY 8 HOURS AS NEEDED FOR NAUSEA OR VOMITING  . Simethicone (GAS-X PO) Take by mouth daily as needed.  . sucralfate (CARAFATE)  1 GM/10ML suspension Take 10 mLs (1 g total) by mouth 4 (four) times daily -  with meals and at bedtime.  . [DISCONTINUED] sucralfate (CARAFATE) 1 GM/10ML suspension Take 10 mLs (1 g total) by mouth 4 (four) times daily -  with meals and at bedtime.  . magic mouthwash w/lidocaine SOLN Take 5 mLs by mouth 4 (four) times daily.  . [DISCONTINUED] diazepam (VALIUM) 2 MG tablet Take 1 tablet (2 mg total) by mouth every 8 (eight) hours as needed for anxiety or muscle spasms (restless legs, dizziness). (Patient taking differently: Take 2 mg by mouth at bedtime. )    . [DISCONTINUED] magic mouthwash w/lidocaine SOLN Take 5 mLs by mouth 4 (four) times daily.   No facility-administered encounter medications on file as of 07/30/2015.   Allergies  Allergen Reactions  . Pravastatin Anaphylaxis  . Actonel [Risedronate Sodium] Other (See Comments)    Not known  . Augmentin [Amoxicillin-Pot Clavulanate] Rash    rash  . Ciprofloxacin Nausea Only  . Evista [Raloxifene] Other (See Comments)    UNKNOWN  . Flagyl [Metronidazole] Other (See Comments)    Not known  . Fosamax [Alendronate Sodium] Other (See Comments)    NOT KNOWN  . Gold-Containing Drug Products Other (See Comments)    Per Dr  . Loma Sousa [Escitalopram Oxalate] Other (See Comments)    shaky  . Simvastatin Other (See Comments)    REACTION: leg cramps, weakness  . Tramadol Other (See Comments)    Watery eyes   Patient Active Problem List   Diagnosis Date Noted  . Long term current use of anticoagulant therapy 07/18/2015  . Dysuria 06/16/2015  . Allergic rhinitis 06/13/2015  . Closed wedge compression fracture of fourth lumbar vertebra ([place])   . Thrush, oral 01/04/2015  . Itching 01/04/2015  . Hypokalemia 11/06/2014  . Fatigue 10/19/2014  . LLQ abdominal pain 10/02/2014  . Swelling of left knee joint 10/02/2014  . Nausea without vomiting 10/02/2014  . DJD (degenerative joint disease) of knee 08/27/2014  . Primary localized osteoarthritis of left knee   . Breast cancer ([place])   . GERD (gastroesophageal reflux disease)   . Preop exam for internal medicine 06/13/2014  . Anxiety state 06/13/2014  . Bradycardia 11/09/2013  . Essential hypertension 11/09/2013  . Well adult exam 05/21/2013  . Preop cardiovascular exam 05/08/2013  . Essential tremor 12/14/2012  . Right hip pain 05/10/2012  . Vertigo 03/09/2012  . Dyslipidemia 03/31/2011  . Meningioma (No Name) 02/03/2011  . TIA (transient ischemic attack) 11/13/2010  . Abnormal CT of brain 11/13/2010  . CAROTID BRUIT 07/28/2010  . Edema  05/02/2010  . Atrial fibrillation ([place]) 04/08/2010  . SYNCOPE 03/31/2010  . LOW BACK PAIN 06/05/2009  . Chronic maxillary sinusitis 01/31/2009  . EFFUSION OF JOINT OTHER SPECIFIED SITE 01/31/2009  . Diarrhea 05/31/2008  . ABNORMAL CHEST XRAY 05/15/2008  . HEMATURIA, MICROSCOPIC, HX OF 08/04/2007  . B12 deficiency 03/21/2007  . Vitamin D deficiency 03/21/2007  . Disease of tricuspid valve 03/21/2007  . DIVERTICULOSIS, COLON 03/21/2007  . OSTEOARTHRITIS 03/21/2007  . Osteoporosis 03/21/2007  . Personal history of malignant neoplasm of breast 03/21/2007  . COLONIC POLYPS, HX OF 03/21/2007   Social History   Social History  . Marital Status: Married    Spouse Name: Gwyndolyn Saxon  . Number of Children: 0  . Years of Education: College   Occupational History  . Retired   . [employer]    Social History Main Topics  . Smoking status: Never Smoker   .  Smokeless tobacco: Never Used  . Alcohol Use: No  . Drug Use: No  . Sexual Activity: No     Comment: HYST   Other Topics Concern  . Not on file   Social History Narrative   ** Merged History Encounter **       Regular Exercise -  NO          Ms. Brian's family history includes Breast cancer (age of onset: 103) in her mother; Colon cancer in her maternal aunt; Ovarian cancer in her maternal grandmother; Tremor in her brother and mother. There is no history of Early death or Stroke.      Objective:    Filed Vitals:   07/30/15 0941  BP: 120/72  Pulse: 78    Physical Exam well-developed elderly white female in no acute distress, pleasant blood pressure 120/72 pulse 78 height 5 foot 2 weight 128 HEENT ;nontraumatic normocephalic EOMI PERRLA sclera anicteric oropharynx is clear she does have some dryness and yellowish coating to back of tongue, Cardiovascular ;regular rate and rhythm with S1-S2 no murmur or gallop, Pulmonary; clear bilaterally, Abdomen ;soft nontender nondistended bowel sounds are active there is no palpable mass  or hepatosplenomegaly, Rectal; exam not done, Neuropsych; mood and affect appropriate       Assessment & Plan:   #1 81  yo female with chronic GERD with recent exacerbation  #2 chronic constipation  #3 anxiety #4 atrial fib- on Coumadin #5 diverticulosis #6 sore tongue- etiology not clear  Plan; Continue Nexium 40 mg po qam, before breakfast  Carafate 1 gm liquid between meals x 3-4 weeks Start Miralax 17 gm on 8 oz water daily in evening, may use colace prn Add daily probiotic= Align or culturelle Magic mouthwash  5 cc swish and swallow qID X 2 weeks Follow up with Dr Hilarie Fredrickson ,in 2-3 months  Most of visit spent clarifying meds and timing of dosing.  Amy S Esterwood PA-C 07/30/2015   Cc: Plotnikov, Evie Lacks, MD

## 2015-07-30 NOTE — Patient Instructions (Addendum)
We sent refills on the Carafate liquid, take 5 cc's between meals for 3 weeks.  Take a daily Probiotic, Align. We have given you a coupon. You can get this at [facility] or [place].  We also sent a prescripton for Magic mouthwash,Take 5 cc's  swish and swallow. For 14 days. We did give you a refill.  Take Miralax 17 grams in 8 oz of water daily. We have given you a coupon,

## 2015-07-31 ENCOUNTER — Ambulatory Visit: Payer: Medicare Other | Admitting: Internal Medicine

## 2015-07-31 ENCOUNTER — Telehealth: Payer: Self-pay | Admitting: Physician Assistant

## 2015-07-31 NOTE — Telephone Encounter (Signed)
She wants to switch to Nexium 20 mg BID. She feels the Nexium 40 mg caused her to have lightheadedness. She does not want a prescription because it will be generic.  She will call me again tomorrow with an update.

## 2015-08-01 ENCOUNTER — Other Ambulatory Visit: Payer: Self-pay

## 2015-08-01 MED ORDER — ESOMEPRAZOLE MAGNESIUM 20 MG PO CPDR: 20.0000 mg | DELAYED_RELEASE_CAPSULE | Freq: Two times a day (BID) | ORAL | Status: DC

## 2015-08-02 ENCOUNTER — Telehealth: Payer: Self-pay | Admitting: Internal Medicine

## 2015-08-02 MED ORDER — FAMOTIDINE 20 MG PO TABS: 20.0000 mg | ORAL_TABLET | Freq: Two times a day (BID) | ORAL | Status: DC

## 2015-08-02 NOTE — Telephone Encounter (Signed)
Pt states she was on prilosec and her PCP switched her to [place], this made her heart race. Pt saw Amy and was switched to Nexium 58m. Pt states that medication "did her in", states she had to go to bed when she took it. She started taking Nexium 290mto see if taking it BID would work better but today is the first day and she has a headache and feels bad. Thinks the nexium caused this. Pt also reports she stopped the magic mouthwash that was prescribed by Amy because it had benadryl in it and she cant take benadryl. Pt wants to know what she can take instead of nexium. Please advise as doc of the day.

## 2015-08-02 NOTE — Telephone Encounter (Signed)
She can try Pepcid 62m daily.

## 2015-08-02 NOTE — Telephone Encounter (Signed)
Pt aware and script sent to pharmacy.

## 2015-08-05 ENCOUNTER — Emergency Department (HOSPITAL_COMMUNITY)
Admission: EM | Admit: 2015-08-05 | Discharge: 2015-08-05 | Disposition: A | Discharge status: Home / Self Care | Payer: Medicare Other | Attending: Emergency Medicine | Admitting: Emergency Medicine

## 2015-08-05 ENCOUNTER — Encounter (HOSPITAL_COMMUNITY): Payer: Self-pay | Admitting: Emergency Medicine

## 2015-08-05 ENCOUNTER — Telehealth: Payer: Self-pay | Admitting: Physician Assistant

## 2015-08-05 DIAGNOSIS — F419 Anxiety disorder, unspecified: Secondary | ICD-10-CM | POA: Insufficient documentation

## 2015-08-05 DIAGNOSIS — G8929 Other chronic pain: Secondary | ICD-10-CM | POA: Diagnosis not present

## 2015-08-05 DIAGNOSIS — Z86010 Personal history of colonic polyps: Secondary | ICD-10-CM | POA: Insufficient documentation

## 2015-08-05 DIAGNOSIS — I4891 Unspecified atrial fibrillation: Secondary | ICD-10-CM | POA: Insufficient documentation

## 2015-08-05 DIAGNOSIS — I10 Essential (primary) hypertension: Secondary | ICD-10-CM | POA: Diagnosis not present

## 2015-08-05 DIAGNOSIS — I361 Nonrheumatic tricuspid (valve) insufficiency: Secondary | ICD-10-CM | POA: Diagnosis not present

## 2015-08-05 DIAGNOSIS — Z79899 Other long term (current) drug therapy: Secondary | ICD-10-CM | POA: Insufficient documentation

## 2015-08-05 DIAGNOSIS — M810 Age-related osteoporosis without current pathological fracture: Secondary | ICD-10-CM | POA: Diagnosis not present

## 2015-08-05 DIAGNOSIS — R1013 Epigastric pain: Secondary | ICD-10-CM | POA: Diagnosis present

## 2015-08-05 DIAGNOSIS — I2510 Atherosclerotic heart disease of native coronary artery without angina pectoris: Secondary | ICD-10-CM | POA: Insufficient documentation

## 2015-08-05 DIAGNOSIS — R1012 Left upper quadrant pain: Secondary | ICD-10-CM

## 2015-08-05 DIAGNOSIS — K219 Gastro-esophageal reflux disease without esophagitis: Secondary | ICD-10-CM | POA: Diagnosis not present

## 2015-08-05 DIAGNOSIS — M158 Other polyosteoarthritis: Secondary | ICD-10-CM | POA: Diagnosis not present

## 2015-08-05 DIAGNOSIS — E538 Deficiency of other specified B group vitamins: Secondary | ICD-10-CM | POA: Insufficient documentation

## 2015-08-05 DIAGNOSIS — Z8673 Personal history of transient ischemic attack (TIA), and cerebral infarction without residual deficits: Secondary | ICD-10-CM | POA: Diagnosis not present

## 2015-08-05 DIAGNOSIS — M419 Scoliosis, unspecified: Secondary | ICD-10-CM | POA: Diagnosis not present

## 2015-08-05 DIAGNOSIS — Z853 Personal history of malignant neoplasm of breast: Secondary | ICD-10-CM | POA: Insufficient documentation

## 2015-08-05 DIAGNOSIS — Z7902 Long term (current) use of antithrombotics/antiplatelets: Secondary | ICD-10-CM | POA: Diagnosis not present

## 2015-08-05 DIAGNOSIS — R251 Tremor, unspecified: Secondary | ICD-10-CM | POA: Diagnosis not present

## 2015-08-05 LAB — COMPREHENSIVE METABOLIC PANEL
ALBUMIN: 3.9 g/dL (ref 3.5–5.0)
ALK PHOS: 51 U/L (ref 38–126)
ALT: 27 U/L (ref 14–54)
ANION GAP: 5 (ref 5–15)
AST: 29 U/L (ref 15–41)
BILIRUBIN TOTAL: 0.6 mg/dL (ref 0.3–1.2)
BUN: 11 mg/dL (ref 6–20)
CALCIUM: 9.2 mg/dL (ref 8.9–10.3)
CO2: 28 mmol/L (ref 22–32)
Chloride: 108 mmol/L (ref 101–111)
Creatinine, Ser: 0.61 mg/dL (ref 0.44–1.00)
GFR calc non Af Amer: >60 mL/min (ref >60)
GLUCOSE: 106 mg/dL — AB (ref 65–99)
Potassium: 3.9 mmol/L (ref 3.5–5.1)
Sodium: 141 mmol/L (ref 135–145)
TOTAL PROTEIN: 6.5 g/dL (ref 6.5–8.1)

## 2015-08-05 LAB — CBC
HCT: 38.2 % (ref 36.0–46.0)
HEMOGLOBIN: 12.4 g/dL (ref 12.0–15.0)
MCH: 27.7 pg (ref 26.0–34.0)
MCHC: 32.5 g/dL (ref 30.0–36.0)
MCV: 85.5 fL (ref 78.0–100.0)
PLATELETS: 264 10*3/uL (ref 150–400)
RBC: 4.47 MIL/uL (ref 3.87–5.11)
RDW: 13.7 % (ref 11.5–15.5)
WBC: 4.3 10*3/uL (ref 4.0–10.5)

## 2015-08-05 NOTE — ED Notes (Signed)
Per pt, states she has been having GI problems on and off for awhile-also having left groin pain-states carafate not working

## 2015-08-05 NOTE — ED Provider Notes (Signed)
CSN: [id number]     Arrival date & time 08/05/15  1442 History   First MD Initiated Contact with Patient 08/05/15 1600     Chief Complaint  Patient presents with  . Nausea     (Consider location/radiation/quality/duration/timing/severity/associated sxs/prior Treatment) HPI Comments: Patient here complaining of intermittent left arm burning which last for seconds which began today. Patient has no cervical disc disease is confirmed recently with an MRI. She denies any headache or visual problems. No bowel or bladder dysfunction. Has chronic lower extremity neuropathy. No cardiac sounding complaints such as chest pain, shortness of breath, diaphoresis. Nothing makes her left arm burning better or worse. No treatment use was prior to arrival.  Patient also complains of chronic epigastric abdominal pain which she is currently being seen by GI for. Review of those records in the chart show that they have tried her on multiple medications for her GERD without success. She denies any black or bloody stools. No weakness appreciated.  The history is provided by the patient.    Past Medical History  Diagnosis Date  . Colon polyps   . Diverticulosis of colon   . GERD (gastroesophageal reflux disease)   . Osteoporosis   . TR (tricuspid regurgitation)     Mild  . Vitamin B12 deficiency   . Vitamin D deficiency   . History of breast cancer   . Hyperlipidemia   . Familial tremor   . LBP (low back pain)   . Cataract   . Atrial fibrillation ([place])     D Taylor  . TIA (transient ischemic attack)   . Hemorrhoids   . Breast cancer ([place]) 1984  . Hypertension   . Dysrhythmia     atrial fib  . Anxiety     has lorazepam on hand for nervousness, pt. reports that she had a break-in to her home on 05/2014  . History of hiatal hernia   . Neuromuscular disorder ([place])     essential tremor  . Osteoarthritis     hands & back & knees   . Primary localized osteoarthritis of left knee   . Coronary artery  disease   . AF (atrial fibrillation) ([place])   . Stroke ([place])   . Scoliosis    Past Surgical History  Procedure Laterality Date  . Breast enhancement surgery    . Total knee arthroplasty  Dec 2011    Right - Dr Noemi Chapel  . Bilateral mastectomy    . Polypectomy    . Colonoscopy    . Vaginal hysterectomy      LAVH BSO  . Oophorectomy      BSO  . Breast surgery      Bilateral mastectomy  . Augmentation mammaplasty    . Mastectomy  1984    bilateral  . Eye surgery      cataracts removed- /w iol  & blepheroplasty  . Joint replacement Right   . Total knee arthroplasty Left 08/27/2014    dr Noemi Chapel  . Total knee arthroplasty Left 08/27/2014    Procedure: LEFT TOTAL KNEE ARTHROPLASTY;  Surgeon: Lorn Junes, MD;  Location: [facility];  Service: Orthopedics;  Laterality: Left;  [name]     Family History  Problem Relation Age of Onset  . Early death Neg Hx   . Stroke Neg Hx   . Breast cancer Mother 23  . Tremor Mother   . Tremor Brother   . Colon cancer Maternal Aunt   . Ovarian cancer Maternal Grandmother  Social History  Substance Use Topics  . Smoking status: Never Smoker   . Smokeless tobacco: Never Used  . Alcohol Use: No   OB History    Gravida Para Term Preterm AB TAB SAB Ectopic Multiple Living   0 0 0 0 0 0 0 0       Review of Systems  All other systems reviewed and are negative.     Allergies  Pravastatin; Actonel; Augmentin; Ciprofloxacin; Evista; Flagyl; Fosamax; Gold-containing drug products; Lexapro; Simvastatin; and Tramadol  Home Medications   Prior to Admission medications   Medication Sig Start Date End Date Taking? Authorizing Provider  alum & mag hydroxide-simeth (MAALOX/MYLANTA) 200-200-20 MG/5ML suspension Take by mouth every 6 (six) hours as needed for indigestion or heartburn.    Historical Provider, MD  amoxicillin (AMOXIL) 500 MG capsule TAKE 4 CAPSULES BY MOUTH ONE HOUR PRIOR TO DENTAL WORK 04/30/15   Historical Provider, MD  apixaban  (ELIQUIS) 5 MG TABS tablet Take 1 tablet (5 mg total) by mouth 2 (two) times daily. 07/02/15   Evans Lance, MD  Calcium & Magnesium Carbonates (MYLANTA PO) Take by mouth as needed.    Historical Provider, MD  carvedilol (COREG) 3.125 MG tablet Take 1 tablet (3.125 mg total) by mouth 2 (two) times daily. 06/06/15   Aleksei Plotnikov V, MD  Cholecalciferol 1000 UNITS tablet Take 1,000 Units by mouth daily. D-3    Historical Provider, MD  Cyanocobalamin (VITAMIN B-12 IJ) Inject as directed every 30 (thirty) days.    Historical Provider, MD  diazepam (VALIUM) 2 MG tablet Take 1 mg by mouth at bedtime.    Historical Provider, MD  Docusate Calcium (STOOL SOFTENER PO) Take 2 tablets by mouth 2 (two) times daily.    Historical Provider, MD  esomeprazole (NEXIUM) 20 MG capsule Take 1 capsule (20 mg total) by mouth 2 (two) times daily before a meal. 08/01/15   Mauri Pole, MD  famotidine (PEPCID) 20 MG tablet Take 1 tablet (20 mg total) by mouth 2 (two) times daily. 08/02/15   Mauri Pole, MD  flecainide (TAMBOCOR) 50 MG tablet Take 1 and 1/2 tablets by  mouth two times daily Patient taking differently: Take 1  tablet by  mouth two times daily 05/15/15   Evans Lance, MD  hydrocortisone 2.5 % cream Apply 1 application topically 3 (three) times daily as needed (itching).    Historical Provider, MD  Linaclotide Rolan Lipa) 290 MCG CAPS capsule Take 1 capsule (290 mcg total) by mouth daily. Patient taking differently: Take 290 mcg by mouth daily as needed.  07/26/15   Aleksei Plotnikov V, MD  losartan (COZAAR) 50 MG tablet Take 0.5 tablets (25 mg total) by mouth daily. 03/15/15   Aleksei Plotnikov V, MD  magic mouthwash w/lidocaine SOLN Take 5 mLs by mouth 4 (four) times daily. 07/30/15   Amy S Esterwood, PA-C  ondansetron (ZOFRAN) 4 MG tablet TAKE 1 TABLET BY MOUTH EVERY 8 HOURS AS NEEDED FOR NAUSEA OR VOMITING 06/14/15   Aleksei Plotnikov V, MD  Simethicone (GAS-X PO) Take by mouth daily as  needed.    Historical Provider, MD  sucralfate (CARAFATE) 1 GM/10ML suspension Take 10 mLs (1 g total) by mouth 4 (four) times daily -  with meals and at bedtime. 07/30/15   Amy S Esterwood, PA-C   BP 139/61 mmHg  Pulse 72  Temp(Src) 98 F (36.7 C) (Oral)  Resp 18  SpO2 100% Physical Exam  Constitutional: She is oriented to  person, place, and time. She appears well-developed and well-nourished.  Non-toxic appearance. No distress.  HENT:  Head: Normocephalic and atraumatic.  Eyes: Conjunctivae, EOM and lids are normal. Pupils are equal, round, and reactive to light.  Neck: Normal range of motion. Neck supple. No tracheal deviation present. No thyroid mass present.  Cardiovascular: Normal rate, regular rhythm and normal heart sounds.  Exam reveals no gallop.   No murmur heard. Pulmonary/Chest: Effort normal and breath sounds normal. No stridor. No respiratory distress. She has no decreased breath sounds. She has no wheezes. She has no rhonchi. She has no rales.  Abdominal: Soft. Normal appearance and bowel sounds are normal. She exhibits no distension. There is no tenderness. There is no rigidity, no rebound, no guarding and no CVA tenderness.  Musculoskeletal: Normal range of motion. She exhibits no edema or tenderness.  Neurological: She is alert and oriented to person, place, and time. She displays tremor. No cranial nerve deficit or sensory deficit. GCS eye subscore is 4. GCS verbal subscore is 5. GCS motor subscore is 6.  Patient has history of essential tremor  Skin: Skin is warm and dry. No abrasion and no rash noted.  Psychiatric: She has a normal mood and affect. Her speech is normal and behavior is normal.  Nursing note and vitals reviewed.   ED Course  Procedures (including critical care time) Labs Review Labs Reviewed  COMPREHENSIVE METABOLIC PANEL - Abnormal; Notable for the following:    Glucose, Bld 106 (*)    All other components within normal limits  CBC    Imaging  Review No results found. I have personally reviewed and evaluated these images and lab results as part of my medical decision-making.   EKG Interpretation None      MDM   Final diagnoses:  None    His labs reviewed with her and without acute findings. Have referred the patient back to her GI doctor. Her neurological exam is normal and I did think that this represents TIA or CVA. She is stable for discharge    Lacretia Leigh, MD 08/05/15 [phone]

## 2015-08-05 NOTE — Discharge Instructions (Signed)
Call your gastrointestinal doctor to schedule a follow-up appointment

## 2015-08-06 ENCOUNTER — Telehealth: Payer: Self-pay | Admitting: Internal Medicine

## 2015-08-06 ENCOUNTER — Other Ambulatory Visit: Payer: Self-pay | Admitting: Internal Medicine

## 2015-08-06 MED ORDER — FAMOTIDINE 20 MG PO TABS: 20.0000 mg | ORAL_TABLET | Freq: Two times a day (BID) | ORAL | Status: DC

## 2015-08-06 NOTE — Telephone Encounter (Signed)
Pt wanted to let you know her coumadin went from 1.9 to 2.8  Between 12/9 to 12/19. She is wondering what she should do  Best number is [phone]

## 2015-08-06 NOTE — Telephone Encounter (Signed)
Vaughan Basta, looks like you've been talking to this patient.Marland KitchenMarland Kitchen

## 2015-08-06 NOTE — Telephone Encounter (Signed)
Pt states carafate has not been helping. Pepcid refilled and sent to pharmacy.

## 2015-08-07 ENCOUNTER — Telehealth: Payer: Self-pay | Admitting: Internal Medicine

## 2015-08-07 ENCOUNTER — Ambulatory Visit (INDEPENDENT_AMBULATORY_CARE_PROVIDER_SITE_OTHER): Payer: Medicare Other | Admitting: General Practice

## 2015-08-07 NOTE — Telephone Encounter (Signed)
Spoke to the patient and she is having trouble with the thrush. She also wasn't sure the Carafate was helping but said she will continue to take it.  She asked me to send a refill if the pharmacy doesn't have one for her.  She also asked for a refill of the Nystatin.  She said she is nauseated and complained that the ondansetron makes her constipated.  I urged her to take the ondansetron if nauseated. I urged her he can take Miralax daily for constipation.Nicoletta Ba PA had me give this information to her with the patient instructions.

## 2015-08-08 ENCOUNTER — Other Ambulatory Visit: Payer: Self-pay | Admitting: Internal Medicine

## 2015-08-08 ENCOUNTER — Telehealth: Payer: Self-pay | Admitting: Internal Medicine

## 2015-08-08 ENCOUNTER — Telehealth: Payer: Self-pay | Admitting: Diagnostic Neuroimaging

## 2015-08-08 DIAGNOSIS — M5410 Radiculopathy, site unspecified: Secondary | ICD-10-CM

## 2015-08-08 NOTE — Telephone Encounter (Signed)
Ok Thx 

## 2015-08-08 NOTE — Telephone Encounter (Signed)
Pt is having some pain in her leg and is wanting to see her Neurologist Dr. Leta Baptist at [facility]. They have an opening for her on the 28th but they need a referral.

## 2015-08-08 NOTE — Telephone Encounter (Signed)
I called the patient. She has had numbness and tingling in both of her feet/legs for a while now. She has been to several doctors and she went to the ED 12/19. Stroke was ruled out. They advised she follow up with her neurologist. I advised that we do need a new referral since it is a new problem, but went ahead and made an appointment for 12/28 as that was the only opening Dr. Leta Baptist has for a while. Patient will make sure her PCP (who has already seen her for this problem) sends the referral before then.

## 2015-08-08 NOTE — Telephone Encounter (Signed)
Patient called and she is having numbness and tingling in her feet and would like to talk to the doctor or his nurse, pt was last seen in August for tremors. Pt is very upset she was told by whoever answered the phone that she w/need a new referral and pt stated that Dr. Leta Baptist told her that he could see her anytime she needed. Please call pt/

## 2015-08-13 ENCOUNTER — Ambulatory Visit (INDEPENDENT_AMBULATORY_CARE_PROVIDER_SITE_OTHER): Payer: Medicare Other | Admitting: General Practice

## 2015-08-13 DIAGNOSIS — I4891 Unspecified atrial fibrillation: Secondary | ICD-10-CM

## 2015-08-13 LAB — POCT INR: INR: 2.2

## 2015-08-13 NOTE — Progress Notes (Signed)
Pre visit review using our clinic review tool, if applicable. No additional management support is needed unless otherwise documented below in the visit note. 

## 2015-08-14 ENCOUNTER — Encounter: Payer: Self-pay | Admitting: Diagnostic Neuroimaging

## 2015-08-14 ENCOUNTER — Ambulatory Visit (INDEPENDENT_AMBULATORY_CARE_PROVIDER_SITE_OTHER): Payer: Medicare Other | Admitting: Diagnostic Neuroimaging

## 2015-08-14 VITALS — BP 141/69 | HR 68 | Ht 62.0 in | Wt 127.8 lb

## 2015-08-14 DIAGNOSIS — M5416 Radiculopathy, lumbar region: Secondary | ICD-10-CM

## 2015-08-14 DIAGNOSIS — M4126 Other idiopathic scoliosis, lumbar region: Secondary | ICD-10-CM

## 2015-08-14 DIAGNOSIS — M419 Scoliosis, unspecified: Secondary | ICD-10-CM
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
## 2015-08-22 ENCOUNTER — Telehealth: Payer: Self-pay | Admitting: Internal Medicine

## 2015-08-22 NOTE — Telephone Encounter (Signed)
Pt called stating that she would like to get the prolia injection. She has an appt tomorrow and I let her know we would have to get verification from insurance because she wanted to get tomorrow. Can you please start on that

## 2015-08-23 ENCOUNTER — Ambulatory Visit (INDEPENDENT_AMBULATORY_CARE_PROVIDER_SITE_OTHER): Payer: Medicare Other | Admitting: Internal Medicine

## 2015-08-23 ENCOUNTER — Encounter: Payer: Self-pay | Admitting: Internal Medicine

## 2015-08-23 VITALS — BP 140/84 | HR 57 | Wt 126.0 lb

## 2015-08-23 DIAGNOSIS — E538 Deficiency of other specified B group vitamins: Secondary | ICD-10-CM

## 2015-08-23 DIAGNOSIS — E559 Vitamin D deficiency, unspecified: Secondary | ICD-10-CM

## 2015-08-23 DIAGNOSIS — I10 Essential (primary) hypertension: Secondary | ICD-10-CM | POA: Diagnosis not present

## 2015-08-23 DIAGNOSIS — M810 Age-related osteoporosis without current pathological fracture: Secondary | ICD-10-CM | POA: Diagnosis not present

## 2015-08-23 DIAGNOSIS — I480 Paroxysmal atrial fibrillation: Secondary | ICD-10-CM | POA: Diagnosis not present

## 2015-08-23 MED ORDER — MIRTAZAPINE 15 MG PO TABS: 15.0000 mg | ORAL_TABLET | Freq: Every day | ORAL | Status: DC

## 2015-08-23 MED ORDER — DENOSUMAB 60 MG/ML ~~LOC~~ SOLN: 60.0000 mg | SUBCUTANEOUS | Status: DC

## 2015-08-23 MED ORDER — TRAMADOL HCL 50 MG PO TABS: 0.5000 mg | ORAL_TABLET | Freq: Four times a day (QID) | ORAL | Status: DC | PRN

## 2015-08-23 NOTE — Assessment & Plan Note (Signed)
On Vit D 

## 2015-08-23 NOTE — Assessment & Plan Note (Signed)
On B12 

## 2015-08-23 NOTE — Assessment & Plan Note (Signed)
On Coreg

## 2015-08-23 NOTE — Assessment & Plan Note (Signed)
1/17 start Staples

## 2015-08-23 NOTE — Progress Notes (Signed)
Pre visit review using our clinic review tool, if applicable. No additional management support is needed unless otherwise documented below in the visit note. 

## 2015-08-23 NOTE — Assessment & Plan Note (Signed)
Pt has not started Eliquis yet

## 2015-08-23 NOTE — Progress Notes (Signed)
Subjective:  Patient ID: Alyssa Luna, female    DOB: 10/01/1933  Age: 82 y.o. MRN: [id number]  CC: No chief complaint on file.   HPI Alyssa Luna presents for LBP, GERD, anxiety, osteoporosis f/u. Carafate did not help. Pt has seen a neurologist - Dr Leta Baptist. She was told it is a pinched nerve in the back...  Outpatient Prescriptions Prior to Visit  Medication Sig Dispense Refill  . alum & mag hydroxide-simeth (MAALOX/MYLANTA) 200-200-20 MG/5ML suspension Take by mouth every 6 (six) hours as needed for indigestion or heartburn.    [name] amoxicillin (AMOXIL) 500 MG capsule TAKE 4 CAPSULES BY MOUTH ONE HOUR PRIOR TO DENTAL WORK  0  . Calcium & Magnesium Carbonates (MYLANTA PO) Take by mouth as needed.    . carvedilol (COREG) 3.125 MG tablet Take 1 tablet (3.125 mg total) by mouth 2 (two) times daily. 180 tablet 3  . Cholecalciferol 1000 UNITS tablet Take 1,000 Units by mouth daily. D-3    . Cyanocobalamin (VITAMIN B-12 IJ) Inject as directed every 30 (thirty) days.    . diazepam (VALIUM) 2 MG tablet Take 1 mg by mouth at bedtime.    . famotidine (PEPCID) 20 MG tablet Take 1 tablet (20 mg total) by mouth 2 (two) times daily. 60 tablet 3  . flecainide (TAMBOCOR) 50 MG tablet Take 1 and 1/2 tablets by  mouth two times daily (Patient taking differently: Take 1  tablet by  mouth two times daily) 270 tablet 1  . hydrocortisone 2.5 % cream Apply 1 application topically 3 (three) times daily as needed (itching).    [name] losartan (COZAAR) 50 MG tablet Take 0.5 tablets (25 mg total) by mouth daily. 30 tablet 5  . Simethicone (GAS-X PO) Take by mouth daily as needed.    . warfarin (COUMADIN) 3 MG tablet 3 mg.    [name] apixaban (ELIQUIS) 5 MG TABS tablet Take 1 tablet (5 mg total) by mouth 2 (two) times daily. (Patient not taking: Reported on 08/23/2015) 60 tablet 6  . Docusate Calcium (STOOL SOFTENER PO) Take 2 tablets by mouth 2 (two) times daily. Reported on 08/23/2015    . Linaclotide (LINZESS) 290  MCG CAPS capsule Take 1 capsule (290 mcg total) by mouth daily. (Patient not taking: Reported on 08/23/2015) 30 capsule 11  . ondansetron (ZOFRAN) 4 MG tablet TAKE 1 TABLET BY MOUTH EVERY 8 HOURS AS NEEDED FOR NAUSEA OR VOMITING (Patient not taking: Reported on 08/23/2015) 30 tablet 0   No facility-administered medications prior to visit.    ROS Review of Systems  Constitutional: Negative for chills, activity change, appetite change, fatigue and unexpected weight change.  HENT: Positive for sore throat and voice change. Negative for congestion, mouth sores and sinus pressure.   Eyes: Negative for visual disturbance.  Respiratory: Negative for cough and chest tightness.   Gastrointestinal: Positive for abdominal pain. Negative for nausea.  Genitourinary: Negative for frequency, difficulty urinating and vaginal pain.  Musculoskeletal: Negative for back pain and gait problem.  Skin: Negative for pallor and rash.  Neurological: Positive for numbness. Negative for dizziness, tremors, weakness and headaches.  Psychiatric/Behavioral: Positive for sleep disturbance. Negative for confusion. The patient is nervous/anxious.     Objective:  BP 140/84 mmHg  Pulse 57  Wt 126 lb (57.153 kg)  SpO2 97%  BP Readings from Last 3 Encounters:  08/23/15 140/84  08/14/15 141/69  08/05/15 126/65    Wt Readings from Last 3 Encounters:  08/23/15 126 lb (57.153  kg)  08/14/15 127 lb 12.8 oz (57.97 kg)  07/30/15 128 lb 6.4 oz (58.242 kg)    Physical Exam  Constitutional: She appears well-developed. No distress.  HENT:  Head: Normocephalic.  Right Ear: External ear normal.  Left Ear: External ear normal.  Nose: Nose normal.  Mouth/Throat: Oropharynx is clear and moist.  Eyes: Conjunctivae are normal. Pupils are equal, round, and reactive to light. Right eye exhibits no discharge. Left eye exhibits no discharge.  Neck: Normal range of motion. Neck supple. No JVD present. No tracheal deviation present. No  thyromegaly present.  Cardiovascular: Normal rate, regular rhythm and normal heart sounds.   Pulmonary/Chest: No stridor. No respiratory distress. She has no wheezes.  Abdominal: Soft. Bowel sounds are normal. She exhibits no distension and no mass. There is no tenderness. There is no rebound and no guarding.  Musculoskeletal: She exhibits no edema or tenderness.  Lymphadenopathy:    She has no cervical adenopathy.  Neurological: She displays normal reflexes. No cranial nerve deficit. She exhibits normal muscle tone. Coordination normal.  Skin: No rash noted. No erythema.  Psychiatric: She has a normal mood and affect. Her behavior is normal. Judgment and thought content normal.    Lab Results  Component Value Date   WBC 4.3 08/05/2015   HGB 12.4 08/05/2015   HCT 38.2 08/05/2015   PLT 264 08/05/2015   GLUCOSE 106* 08/05/2015   CHOL 189 10/30/2014   TRIG 151.0* 10/30/2014   HDL 41.30 10/30/2014   LDLDIRECT 178.0 09/24/2011   LDLCALC 118* 10/30/2014   ALT 27 08/05/2015   AST 29 08/05/2015   NA 141 08/05/2015   K 3.9 08/05/2015   CL 108 08/05/2015   CREATININE 0.61 08/05/2015   BUN 11 08/05/2015   CO2 28 08/05/2015   TSH 1.991 02/01/2015   INR 2.2 08/13/2015    No results found.  Assessment & Plan:   Diagnoses and all orders for this visit:  Essential hypertension -     VITAMIN D 25 Hydroxy (Vit-D Deficiency, Fractures); Future  Paroxysmal atrial fibrillation (HCC)  B12 deficiency  Osteoporosis -     VITAMIN D 25 Hydroxy (Vit-D Deficiency, Fractures); Future -     Basic metabolic panel; Future  Vitamin D deficiency -     VITAMIN D 25 Hydroxy (Vit-D Deficiency, Fractures); Future  Other orders -     denosumab (PROLIA) 60 MG/ML SOLN injection; Inject 60 mg into the skin every 6 (six) months. Administer in upper arm, thigh, or abdomen -     mirtazapine (REMERON) 15 MG tablet; Take 1 tablet (15 mg total) by mouth at bedtime.  I am having Ms. Shifflett start on  denosumab and mirtazapine. I am also having her maintain her Cholecalciferol, Cyanocobalamin (VITAMIN B-12 IJ), hydrocortisone, losartan, flecainide, amoxicillin, carvedilol, ondansetron, apixaban, Linaclotide, diazepam, Docusate Calcium (STOOL SOFTENER PO), alum & mag hydroxide-simeth, Calcium & Magnesium Carbonates (MYLANTA PO), Simethicone (GAS-X PO), famotidine, and warfarin.  Meds ordered this encounter  Medications  . denosumab (PROLIA) 60 MG/ML SOLN injection    Sig: Inject 60 mg into the skin every 6 (six) months. Administer in upper arm, thigh, or abdomen    Dispense:  1.8 mL    Refill:  6  . mirtazapine (REMERON) 15 MG tablet    Sig: Take 1 tablet (15 mg total) by mouth at bedtime.    Dispense:  30 tablet    Refill:  11     Follow-up: Return in about 6 weeks (around 10/04/2015) for  a follow-up visit.  Walker Kehr, MD

## 2015-08-27 ENCOUNTER — Telehealth: Payer: Self-pay | Admitting: Physician Assistant

## 2015-08-27 ENCOUNTER — Telehealth: Payer: Self-pay | Admitting: Internal Medicine

## 2015-08-27 NOTE — Telephone Encounter (Signed)
Pt informed

## 2015-08-27 NOTE — Telephone Encounter (Signed)
I have electronically submitted pt's info for [employer] insurance verification and will notify you once I have a response. Thank you. °

## 2015-08-27 NOTE — Telephone Encounter (Signed)
I have done this and entered another phone note w/today's date.  Please refer to it. Thank you.

## 2015-08-28 NOTE — Telephone Encounter (Signed)
She is off Carafate. She is continuing to have nausea in the mornings. She will take Pepto-Bismol for this, but it doesn't sound like that helps very much and she is worried about an interaction with her Coumadin. No new medications. This is a chronic problem. She is again encouraged to take the Zofran at bedtime which she has not been doing. She is not having constipation since starting Mira lax. She asks if she needs to keep the appointment with Dr Hilarie Fredrickson. Encouraged to do so as these concerns have not been successfully resolved over the telephone.

## 2015-08-29 NOTE — Telephone Encounter (Signed)
Alyssa Luna (?not sure how to spell last name) left mssg on my v/m that they sent incorrect verification and would be sending correct info.  I have not seen the incorrect info yet.

## 2015-09-01 ENCOUNTER — Emergency Department (HOSPITAL_COMMUNITY)
Admission: EM | Admit: 2015-09-01 | Discharge: 2015-09-02 | Disposition: A | Discharge status: Home / Self Care | Payer: Medicare Other | Attending: Emergency Medicine | Admitting: Emergency Medicine

## 2015-09-01 ENCOUNTER — Emergency Department (HOSPITAL_COMMUNITY): Payer: Medicare Other

## 2015-09-01 ENCOUNTER — Encounter (HOSPITAL_COMMUNITY): Payer: Self-pay | Admitting: Emergency Medicine

## 2015-09-01 DIAGNOSIS — R109 Unspecified abdominal pain: Secondary | ICD-10-CM

## 2015-09-01 DIAGNOSIS — F419 Anxiety disorder, unspecified: Secondary | ICD-10-CM | POA: Diagnosis not present

## 2015-09-01 DIAGNOSIS — Z86010 Personal history of colonic polyps: Secondary | ICD-10-CM | POA: Diagnosis not present

## 2015-09-01 DIAGNOSIS — E538 Deficiency of other specified B group vitamins: Secondary | ICD-10-CM | POA: Insufficient documentation

## 2015-09-01 DIAGNOSIS — I4891 Unspecified atrial fibrillation: Secondary | ICD-10-CM | POA: Diagnosis not present

## 2015-09-01 DIAGNOSIS — M419 Scoliosis, unspecified: Secondary | ICD-10-CM | POA: Insufficient documentation

## 2015-09-01 DIAGNOSIS — Z79899 Other long term (current) drug therapy: Secondary | ICD-10-CM | POA: Diagnosis not present

## 2015-09-01 DIAGNOSIS — R110 Nausea: Secondary | ICD-10-CM | POA: Diagnosis not present

## 2015-09-01 DIAGNOSIS — I10 Essential (primary) hypertension: Secondary | ICD-10-CM | POA: Diagnosis not present

## 2015-09-01 DIAGNOSIS — K219 Gastro-esophageal reflux disease without esophagitis: Secondary | ICD-10-CM | POA: Diagnosis not present

## 2015-09-01 DIAGNOSIS — R1032 Left lower quadrant pain: Secondary | ICD-10-CM | POA: Insufficient documentation

## 2015-09-01 DIAGNOSIS — I2510 Atherosclerotic heart disease of native coronary artery without angina pectoris: Secondary | ICD-10-CM | POA: Diagnosis not present

## 2015-09-01 DIAGNOSIS — Z7901 Long term (current) use of anticoagulants: Secondary | ICD-10-CM | POA: Diagnosis not present

## 2015-09-01 DIAGNOSIS — Z853 Personal history of malignant neoplasm of breast: Secondary | ICD-10-CM | POA: Insufficient documentation

## 2015-09-01 DIAGNOSIS — Z8639 Personal history of other endocrine, nutritional and metabolic disease: Secondary | ICD-10-CM | POA: Insufficient documentation

## 2015-09-01 DIAGNOSIS — Z8673 Personal history of transient ischemic attack (TIA), and cerebral infarction without residual deficits: Secondary | ICD-10-CM | POA: Insufficient documentation

## 2015-09-01 DIAGNOSIS — Z8669 Personal history of other diseases of the nervous system and sense organs: Secondary | ICD-10-CM | POA: Insufficient documentation

## 2015-09-01 LAB — CBC
HEMATOCRIT: 39.8 % (ref 36.0–46.0)
Hemoglobin: 13.1 g/dL (ref 12.0–15.0)
MCH: 28.2 pg (ref 26.0–34.0)
MCHC: 32.9 g/dL (ref 30.0–36.0)
MCV: 85.8 fL (ref 78.0–100.0)
PLATELETS: 222 10*3/uL (ref 150–400)
RBC: 4.64 MIL/uL (ref 3.87–5.11)
RDW: 13.8 % (ref 11.5–15.5)
WBC: 4.8 10*3/uL (ref 4.0–10.5)

## 2015-09-01 LAB — URINALYSIS, ROUTINE W REFLEX MICROSCOPIC
Bilirubin Urine: NEGATIVE
GLUCOSE, UA: NEGATIVE mg/dL
Ketones, ur: NEGATIVE mg/dL
LEUKOCYTES UA: NEGATIVE
NITRITE: NEGATIVE
PROTEIN: NEGATIVE mg/dL
Specific Gravity, Urine: 1.003 — ABNORMAL LOW (ref 1.005–1.030)
pH: 6.5 (ref 5.0–8.0)

## 2015-09-01 LAB — COMPREHENSIVE METABOLIC PANEL
ALBUMIN: 4 g/dL (ref 3.5–5.0)
ALT: 19 U/L (ref 14–54)
AST: 24 U/L (ref 15–41)
Alkaline Phosphatase: 53 U/L (ref 38–126)
Anion gap: 10 (ref 5–15)
BUN: 13 mg/dL (ref 6–20)
CHLORIDE: 102 mmol/L (ref 101–111)
CO2: 28 mmol/L (ref 22–32)
CREATININE: 0.74 mg/dL (ref 0.44–1.00)
Calcium: 9.7 mg/dL (ref 8.9–10.3)
GFR calc Af Amer: >60 mL/min (ref >60)
Glucose, Bld: 109 mg/dL — ABNORMAL HIGH (ref 65–99)
POTASSIUM: 4.1 mmol/L (ref 3.5–5.1)
SODIUM: 140 mmol/L (ref 135–145)
TOTAL PROTEIN: 6.7 g/dL (ref 6.5–8.1)
Total Bilirubin: 0.9 mg/dL (ref 0.3–1.2)

## 2015-09-01 LAB — URINE MICROSCOPIC-ADD ON: BACTERIA UA: NONE SEEN

## 2015-09-01 LAB — LIPASE, BLOOD: LIPASE: 41 U/L (ref 11–51)

## 2015-09-01 MED ORDER — PROMETHAZINE HCL 25 MG/ML IJ SOLN
12.5000 mg | Freq: Once | INTRAMUSCULAR | Status: AC
  Administered 2015-09-01: 12.5 mg via INTRAVENOUS
  Filled 2015-09-01: qty 1

## 2015-09-01 MED ORDER — IOHEXOL 300 MG/ML  SOLN
100.0000 mL | Freq: Once | INTRAMUSCULAR | Status: AC | PRN
  Administered 2015-09-01: 100 mL via INTRAVENOUS

## 2015-09-01 MED ORDER — SODIUM CHLORIDE 0.9 % IV BOLUS (SEPSIS)
500.0000 mL | Freq: Once | INTRAVENOUS | Status: AC
  Administered 2015-09-01: 500 mL via INTRAVENOUS

## 2015-09-01 NOTE — ED Provider Notes (Signed)
CSN: [id number]     Arrival date & time 09/01/15  1800 History   First MD Initiated Contact with Patient 09/01/15 2115     Chief Complaint  Patient presents with  . Nausea  . Constipation    HPI   Alyssa Luna is a 82 y.o. female with a PMH of HTN, CAD, PAF on coumadin, colon polyps, diverticulosis, GERD, anxiety who presents to the ED with intermittent left-sided abdominal pain and nausea. She states she has experienced these symptoms for the past month, however reports her symptoms worsened this week. She denies exacerbating or alleviating factors. She was prescribed zofran by her gastroenterologist, however she states this makes her feel constipated. She states she takes miralax for constipation, and had 2 normal bowel movements today. She denies hematochezia or melena. She denies fever, chills, chest pain, shortness of breath, vomiting, diarrhea, dysuria, urgency, frequency. She states she felt "off today" and wanted to come to the ED to be evaluated.   Past Medical History  Diagnosis Date  . Colon polyps   . Diverticulosis of colon   . GERD (gastroesophageal reflux disease)   . Osteoporosis   . TR (tricuspid regurgitation)     Mild  . Vitamin B12 deficiency   . Vitamin D deficiency   . History of breast cancer   . Hyperlipidemia   . Familial tremor   . LBP (low back pain)   . Cataract   . Atrial fibrillation ([place])     D Taylor  . TIA (transient ischemic attack)   . Hemorrhoids   . Breast cancer ([place]) 1984  . Hypertension   . Dysrhythmia     atrial fib  . Anxiety     has lorazepam on hand for nervousness, pt. reports that she had a break-in to her home on 05/2014  . History of hiatal hernia   . Neuromuscular disorder ([place])     essential tremor  . Osteoarthritis     hands & back & knees   . Primary localized osteoarthritis of left knee   . Coronary artery disease   . AF (atrial fibrillation) ([place])   . Stroke ([place])   . Scoliosis    Past Surgical History   Procedure Laterality Date  . Breast enhancement surgery    . Total knee arthroplasty  Dec 2011    Right - Dr Noemi Chapel  . Bilateral mastectomy    . Polypectomy    . Colonoscopy    . Vaginal hysterectomy      [facility] BSO  . Oophorectomy      BSO  . Breast surgery      Bilateral mastectomy  . Augmentation mammaplasty    . Mastectomy  1984    bilateral  . Eye surgery      cataracts removed- /w iol  & blepheroplasty  . Joint replacement Right   . Total knee arthroplasty Left 08/27/2014    dr Noemi Chapel  . Total knee arthroplasty Left 08/27/2014    Procedure: LEFT TOTAL KNEE ARTHROPLASTY;  Surgeon: Lorn Junes, MD;  Location: [facility];  Service: Orthopedics;  Laterality: Left;  [name]     Family History  Problem Relation Age of Onset  . Early death Neg Hx   . Stroke Neg Hx   . Breast cancer Mother 57  . Tremor Mother   . Tremor Brother   . Colon cancer Maternal Aunt   . Ovarian cancer Maternal Grandmother    Social History  Substance Use Topics  .  Smoking status: Never Smoker   . Smokeless tobacco: Never Used  . Alcohol Use: No   OB History    Gravida Para Term Preterm AB TAB SAB Ectopic Multiple Living   0 0 0 0 0 0 0 0        Review of Systems  Constitutional: Negative for fever and chills.  Respiratory: Negative for shortness of breath.   Cardiovascular: Negative for chest pain.  Gastrointestinal: Positive for nausea, abdominal pain and constipation. Negative for vomiting, diarrhea and blood in stool.  Genitourinary: Negative for dysuria, urgency and frequency.  All other systems reviewed and are negative.     Allergies  Pravastatin; Actonel; Augmentin; Ciprofloxacin; Evista; Flagyl; Fosamax; Gold-containing drug products; Lexapro; Simvastatin; and Tramadol  Home Medications   Prior to Admission medications   Medication Sig Start Date End Date Taking? Authorizing Provider  alum & mag hydroxide-simeth (MAALOX/MYLANTA) 200-200-20 MG/5ML suspension Take by  mouth every 6 (six) hours as needed for indigestion or heartburn.   Yes Historical Provider, MD  amoxicillin (AMOXIL) 500 MG capsule TAKE 4 CAPSULES BY MOUTH ONE HOUR PRIOR TO DENTAL WORK 04/30/15  Yes Historical Provider, MD  carvedilol (COREG) 3.125 MG tablet Take 1 tablet (3.125 mg total) by mouth 2 (two) times daily. 06/06/15  Yes Aleksei Plotnikov V, MD  Cholecalciferol 1000 UNITS tablet Take 1,000 Units by mouth daily. D-3   Yes Historical Provider, MD  Cyanocobalamin (VITAMIN B-12 IJ) Inject as directed every 30 (thirty) days.   Yes Historical Provider, MD  diazepam (VALIUM) 2 MG tablet Take 1 mg by mouth at bedtime.   Yes Historical Provider, MD  famotidine (PEPCID) 20 MG tablet Take 1 tablet (20 mg total) by mouth 2 (two) times daily. Patient taking differently: Take 20 mg by mouth at bedtime.  08/06/15  Yes Mauri Pole, MD  flecainide (TAMBOCOR) 50 MG tablet Take 1 and 1/2 tablets by  mouth two times daily Patient taking differently: Take 50 mg by mouth twice daily 05/15/15  Yes Evans Lance, MD  hydrocortisone 2.5 % cream Apply 1 application topically 3 (three) times daily as needed (itching).   Yes Historical Provider, MD  losartan (COZAAR) 50 MG tablet Take 0.5 tablets (25 mg total) by mouth daily. Patient taking differently: Take 25 mg by mouth daily as needed (BP over 150).  03/15/15  Yes Aleksei Plotnikov V, MD  ondansetron (ZOFRAN) 4 MG tablet TAKE 1 TABLET BY MOUTH EVERY 8 HOURS AS NEEDED FOR NAUSEA OR VOMITING 06/14/15  Yes Aleksei Plotnikov V, MD  Simethicone (GAS-X PO) Take 1 tablet by mouth daily as needed (flatulence). Reported on 09/01/2015   Yes Historical Provider, MD  warfarin (COUMADIN) 3 MG tablet Take 3-6 mg by mouth daily. Take 6 mg everyday except Monday take 3 mg 06/15/12  Yes Historical Provider, MD  apixaban (ELIQUIS) 5 MG TABS tablet Take 1 tablet (5 mg total) by mouth 2 (two) times daily. Patient not taking: Reported on 08/23/2015 07/02/15   Evans Lance, MD   denosumab (PROLIA) 60 MG/ML SOLN injection Inject 60 mg into the skin every 6 (six) months. Administer in upper arm, thigh, or abdomen Patient not taking: Reported on 09/01/2015 08/23/15   Cassandria Anger, MD  Linaclotide (LINZESS) 290 MCG CAPS capsule Take 1 capsule (290 mcg total) by mouth daily. Patient not taking: Reported on 08/23/2015 07/26/15   Lew Dawes V, MD  mirtazapine (REMERON) 15 MG tablet Take 1 tablet (15 mg total) by mouth at bedtime. Patient  not taking: Reported on 09/01/2015 08/23/15   Aleksei Plotnikov V, MD  sucralfate (CARAFATE) 1 GM/10ML suspension Take 10 mLs (1 g total) by mouth 4 (four) times daily -  with meals and at bedtime. 09/02/15   Marella Chimes, PA-C  traMADol (ULTRAM) 50 MG tablet Take 0.5 tablets (25 mg total) by mouth every 6 (six) hours as needed for moderate pain or severe pain. Patient not taking: Reported on 09/01/2015 08/23/15   Aleksei Plotnikov V, MD    BP 138/64 mmHg  Pulse 67  Temp(Src) 97.9 F (36.6 C) (Oral)  Resp 16  SpO2 98% Physical Exam  Constitutional: She is oriented to person, place, and time. She appears well-developed and well-nourished. No distress.  HENT:  Head: Normocephalic and atraumatic.  Right Ear: External ear normal.  Left Ear: External ear normal.  Nose: Nose normal.  Mouth/Throat: Uvula is midline, oropharynx is clear and moist and mucous membranes are normal.  Eyes: Conjunctivae, EOM and lids are normal. Pupils are equal, round, and reactive to light. Right eye exhibits no discharge. Left eye exhibits no discharge. No scleral icterus.  Neck: Normal range of motion. Neck supple.  Cardiovascular: Normal rate, regular rhythm, normal heart sounds, intact distal pulses and normal pulses.   Pulmonary/Chest: Effort normal and breath sounds normal. No respiratory distress. She has no wheezes. She has no rales.  Abdominal: Soft. Normal appearance and bowel sounds are normal. She exhibits no distension and no mass. There  is tenderness. There is no rigidity, no rebound and no guarding.  Tenderness to palpation in left lower quadrant. No rebound, guarding, or masses.  Musculoskeletal: Normal range of motion. She exhibits no edema or tenderness.  Neurological: She is alert and oriented to person, place, and time. She has normal strength. No sensory deficit.  Skin: Skin is warm, dry and intact. No rash noted. She is not diaphoretic. No erythema. No pallor.  Psychiatric: She has a normal mood and affect. Her speech is normal and behavior is normal.  Nursing note and vitals reviewed.   ED Course  Procedures (including critical care time)  Labs Review Labs Reviewed  COMPREHENSIVE METABOLIC PANEL - Abnormal; Notable for the following:    Glucose, Bld 109 (*)    All other components within normal limits  URINALYSIS, ROUTINE W REFLEX MICROSCOPIC (NOT AT [facility]) - Abnormal; Notable for the following:    Specific Gravity, Urine 1.003 (*)    Hgb urine dipstick TRACE (*)    All other components within normal limits  URINE MICROSCOPIC-ADD ON - Abnormal; Notable for the following:    Squamous Epithelial / LPF 0-5 (*)    All other components within normal limits  LIPASE, BLOOD  CBC    Imaging Review Ct Abdomen Pelvis W Contrast  09/02/2015  CLINICAL DATA:  Chronic left flank pain for several months. Nausea and constipation. Initial encounter. EXAM: CT ABDOMEN AND PELVIS WITH CONTRAST TECHNIQUE: Multidetector CT imaging of the abdomen and pelvis was performed using the standard protocol following bolus administration of intravenous contrast. CONTRAST:  159m OMNIPAQUE IOHEXOL 300 MG/ML  SOLN COMPARISON:  CT of the abdomen and pelvis from 05/03/2015 FINDINGS: The visualized lung bases are clear. Bilateral breast implants are partially imaged. Small hypodensities within the liver are nonspecific, measuring up to 6 mm. A few calcified granulomata are suggested within the liver. The spleen is unremarkable in appearance. The  gallbladder is within normal limits. The pancreas and adrenal glands are unremarkable. The kidneys are unremarkable in appearance. There  is no evidence of hydronephrosis. No renal or ureteral stones are seen. No perinephric stranding is appreciated. No free fluid is identified. The small bowel is unremarkable in appearance. The stomach is within normal limits. No acute vascular abnormalities are seen. The appendix is normal in caliber, without evidence of appendicitis. Contrast progresses to the level of the ascending colon. Scattered diverticulosis is noted along the sigmoid colon, without evidence of diverticulitis. The bladder is mildly distended and grossly unremarkable. The patient is status post hysterectomy. No suspicious adnexal masses are seen. No inguinal lymphadenopathy is seen. No acute osseous abnormalities are identified. There is chronic compression deformity of vertebral body L1, with associated vertebroplasty. Mild multilevel disc space narrowing and vacuum phenomenon are noted along the mid lumbar spine. IMPRESSION: 1. No acute abnormality seen to explain the patient's symptoms. 2. Scattered diverticulosis along the sigmoid colon, without evidence of diverticulitis. 3. Nonspecific small hypodensities within the liver, measuring up to 6 mm. 4. Chronic compression deformity of vertebral body L1, with changes of vertebroplasty. Mild degenerative change along the lumbar spine. Electronically Signed   By: Garald Balding M.D.   On: 09/02/2015 00:41     I have personally reviewed and evaluated these images and lab results as part of my medical decision-making.   EKG Interpretation   Date/Time:  Sunday September 01 2015 18:15:07 EST Ventricular Rate:  56 PR Interval:  184 QRS Duration: 78 QT Interval:  452 QTC Calculation: 436 R Axis:   84 Text Interpretation:  Sinus rhythm Probable left atrial enlargement  Borderline right axis deviation Nonspecific T abnormalities, lateral leads  Confirmed  by Christy Gentles  MD, Elenore Rota ([id number]) on 09/02/2015 12:17:51 AM      MDM   Final diagnoses:  Abdominal pain  Nausea    82 year old female presents with abdominal pain and nausea. Per record review, patient has been evaluated by gastroenterology for history of GERD.  Patient is afebrile. Vital signs stable. Abdomen soft, nondistended, with mild tenderness to palpation in left lower quadrant. No rebound, guarding, or masses. No CVA tenderness.   CBC negative for leukocytosis or anemia. CMP unremarkable. Lipase within normal limits. UA negative for infection. Patient given phenergan for nausea, and reports symptom resolution. CT abdomen pelvis pending. Patient discussed with and seen by Dr. Tomi Bamberger.  Imaging negative for acute abnormality, reveals scattered diverticulosis without evidence of diverticulitis. On reassessment of patient, she reports symptom improvement. Patient is nontoxic and well-appearing, feel she is stable for discharge at this time. Will refill carafate prescription. Patient to follow-up with GI for further evaluation and management. Return precautions discussed. Patient verbalizes her understanding and is in agreement with plan.  BP 138/64 mmHg  Pulse 67  Temp(Src) 97.9 F (36.6 C) (Oral)  Resp 16  SpO2 98%     Marella Chimes, PA-C 09/02/15 0056  Dorie Rank, MD 09/03/15 1027

## 2015-09-01 NOTE — ED Notes (Addendum)
Pt states that she "wants her heart checked" because she has been feeling "off" today.  C/o lt flank pain x several months.  States hx of afib and felt her heart flutter last night.  Nauseated and has been taking miralax for constipation.

## 2015-09-01 NOTE — ED Notes (Signed)
Patient transported to CT 

## 2015-09-02 MED ORDER — SUCRALFATE 1 GM/10ML PO SUSP: 1.0000 g | Freq: Three times a day (TID) | ORAL | Status: DC

## 2015-09-02 NOTE — Discharge Instructions (Signed)
1. Medications: carafate, usual home medications 2. Treatment: rest, drink plenty of fluids 3. Follow Up: please followup with your gastroenterologist for discussion of your diagnoses and further evaluation after today's visit; if you do not have a primary care doctor use the resource guide provided to find one; please return to the ER for severe pain, persistent vomiting, new or worsening symptoms   Abdominal Pain, Adult Many things can cause abdominal pain. Usually, abdominal pain is not caused by a disease and will improve without treatment. It can often be observed and treated at home. Your health care provider will do a physical exam and possibly order blood tests and X-rays to help determine the seriousness of your pain. However, in many cases, more time must pass before a clear cause of the pain can be found. Before that point, your health care provider may not know if you need more testing or further treatment. HOME CARE INSTRUCTIONS Monitor your abdominal pain for any changes. The following actions may help to alleviate any discomfort you are experiencing:  Only take over-the-counter or prescription medicines as directed by your health care provider.  Do not take laxatives unless directed to do so by your health care provider.  Try a clear liquid diet (broth, tea, or water) as directed by your health care provider. Slowly move to a bland diet as tolerated. SEEK MEDICAL CARE IF:  You have unexplained abdominal pain.  You have abdominal pain associated with nausea or diarrhea.  You have pain when you urinate or have a bowel movement.  You experience abdominal pain that wakes you in the night.  You have abdominal pain that is worsened or improved by eating food.  You have abdominal pain that is worsened with eating fatty foods.  You have a fever. SEEK IMMEDIATE MEDICAL CARE IF:  Your pain does not go away within 2 hours.  You keep throwing up (vomiting).  Your pain is felt  only in portions of the abdomen, such as the right side or the left lower portion of the abdomen.  You pass bloody or black tarry stools. MAKE SURE YOU:  Understand these instructions.  Will watch your condition.  Will get help right away if you are not doing well or get worse.   This information is not intended to replace advice given to you by your health care provider. Make sure you discuss any questions you have with your health care provider.   Document Released: 05/13/2005 Document Revised: 04/24/2015 Document Reviewed: 04/12/2013 Elsevier Interactive Patient Education [employer].

## 2015-09-03 ENCOUNTER — Ambulatory Visit (INDEPENDENT_AMBULATORY_CARE_PROVIDER_SITE_OTHER): Payer: Medicare Other | Admitting: General Practice

## 2015-09-03 ENCOUNTER — Ambulatory Visit: Payer: Medicare Other

## 2015-09-03 DIAGNOSIS — Z5181 Encounter for therapeutic drug level monitoring: Secondary | ICD-10-CM | POA: Diagnosis not present

## 2015-09-03 DIAGNOSIS — I4891 Unspecified atrial fibrillation: Secondary | ICD-10-CM

## 2015-09-03 LAB — POCT INR: INR: 3.4

## 2015-09-03 NOTE — Progress Notes (Signed)
Pre visit review using our clinic review tool, if applicable. No additional management support is needed unless otherwise documented below in the visit note.  INR is high today.  Held one dose of medication and then asked patient to continue same dosage.  Patient states she has been nauseated and hasn't wanted to eat.   Re-check in 3 weeks.

## 2015-09-03 NOTE — Progress Notes (Signed)
I have reviewed and agree with the plan.

## 2015-09-11 ENCOUNTER — Encounter: Payer: Self-pay | Admitting: *Deleted

## 2015-09-13 NOTE — Telephone Encounter (Signed)
Is asking to follow up with insurance company

## 2015-09-18 ENCOUNTER — Other Ambulatory Visit (INDEPENDENT_AMBULATORY_CARE_PROVIDER_SITE_OTHER): Payer: Medicare Other

## 2015-09-18 ENCOUNTER — Encounter: Payer: Self-pay | Admitting: Internal Medicine

## 2015-09-18 ENCOUNTER — Ambulatory Visit (INDEPENDENT_AMBULATORY_CARE_PROVIDER_SITE_OTHER): Payer: Medicare Other | Admitting: Internal Medicine

## 2015-09-18 VITALS — BP 128/70 | HR 74 | Wt 127.0 lb

## 2015-09-18 DIAGNOSIS — S32040S Wedge compression fracture of fourth lumbar vertebra, sequela: Secondary | ICD-10-CM

## 2015-09-18 DIAGNOSIS — E538 Deficiency of other specified B group vitamins: Secondary | ICD-10-CM | POA: Diagnosis not present

## 2015-09-18 DIAGNOSIS — M810 Age-related osteoporosis without current pathological fracture: Secondary | ICD-10-CM

## 2015-09-18 DIAGNOSIS — E559 Vitamin D deficiency, unspecified: Secondary | ICD-10-CM

## 2015-09-18 DIAGNOSIS — I10 Essential (primary) hypertension: Secondary | ICD-10-CM | POA: Diagnosis not present

## 2015-09-18 LAB — BASIC METABOLIC PANEL
BUN: 13 mg/dL (ref 6–23)
CALCIUM: 9.4 mg/dL (ref 8.4–10.5)
CHLORIDE: 104 meq/L (ref 96–112)
CO2: 29 meq/L (ref 19–32)
CREATININE: 0.7 mg/dL (ref 0.40–1.20)
GFR: 85.57 mL/min (ref >60.00)
Glucose, Bld: 89 mg/dL (ref 70–99)
Potassium: 3.9 mEq/L (ref 3.5–5.1)
Sodium: 140 mEq/L (ref 135–145)

## 2015-09-18 LAB — VITAMIN D 25 HYDROXY (VIT D DEFICIENCY, FRACTURES): VITD: 32.53 ng/mL (ref 30.00–100.00)

## 2015-09-18 MED ORDER — DENOSUMAB 60 MG/ML ~~LOC~~ SOLN: 60.0000 mg | SUBCUTANEOUS | Status: AC

## 2015-09-18 MED ORDER — FLECAINIDE ACETATE 50 MG PO TABS: 25.0000 mg | ORAL_TABLET | Freq: Once | ORAL | Status: DC

## 2015-09-18 MED ORDER — CYANOCOBALAMIN 1000 MCG/ML IJ SOLN
1000.0000 ug | Freq: Once | INTRAMUSCULAR | Status: AC
  Administered 2015-09-18: 1000 ug via INTRAMUSCULAR

## 2015-09-18 NOTE — Assessment & Plan Note (Signed)
1/17 pt must have Prolia Vit D, Ca

## 2015-09-18 NOTE — Progress Notes (Signed)
Pre visit review using our clinic review tool, if applicable. No additional management support is needed unless otherwise documented below in the visit note. 

## 2015-09-18 NOTE — Assessment & Plan Note (Signed)
Corrected Vit D

## 2015-09-18 NOTE — Progress Notes (Signed)
Subjective:  Patient ID: Alyssa Luna, female    DOB: 1934/06/13  Age: 82 y.o. MRN: [id number]  CC: No chief complaint on file.   HPI Alyssa Luna presents for osteoporosis, compr fx, LBP f/u. Prolia would be the best options. BP is 128/80 in am, then would drop to 90  Outpatient Prescriptions Prior to Visit  Medication Sig Dispense Refill  . alum & mag hydroxide-simeth (MAALOX/MYLANTA) 200-200-20 MG/5ML suspension Take by mouth every 6 (six) hours as needed for indigestion or heartburn.    [name] amoxicillin (AMOXIL) 500 MG capsule TAKE 4 CAPSULES BY MOUTH ONE HOUR PRIOR TO DENTAL WORK  0  . carvedilol (COREG) 3.125 MG tablet Take 1 tablet (3.125 mg total) by mouth 2 (two) times daily. 180 tablet 3  . Cholecalciferol 1000 UNITS tablet Take 1,000 Units by mouth daily. D-3    . Cyanocobalamin (VITAMIN B-12 IJ) Inject as directed every 30 (thirty) days.    . diazepam (VALIUM) 2 MG tablet Take 1 mg by mouth at bedtime.    . famotidine (PEPCID) 20 MG tablet Take 1 tablet (20 mg total) by mouth 2 (two) times daily. (Patient taking differently: Take 20 mg by mouth at bedtime. ) 60 tablet 3  . hydrocortisone 2.5 % cream Apply 1 application topically 3 (three) times daily as needed (itching).    [name] losartan (COZAAR) 50 MG tablet Take 0.5 tablets (25 mg total) by mouth daily. (Patient taking differently: Take 25 mg by mouth daily as needed (BP over 150). ) 30 tablet 5  . ondansetron (ZOFRAN) 4 MG tablet TAKE 1 TABLET BY MOUTH EVERY 8 HOURS AS NEEDED FOR NAUSEA OR VOMITING 30 tablet 0  . Simethicone (GAS-X PO) Take 1 tablet by mouth daily as needed (flatulence). Reported on 09/01/2015    . traMADol (ULTRAM) 50 MG tablet Take 0.5 tablets (25 mg total) by mouth every 6 (six) hours as needed for moderate pain or severe pain. 60 tablet 3  . warfarin (COUMADIN) 3 MG tablet Take 3-6 mg by mouth daily. Take 6 mg everyday except Monday take 3 mg    . flecainide (TAMBOCOR) 50 MG tablet Take 1 and 1/2  tablets by  mouth two times daily (Patient taking differently: Take 50 mg by mouth twice daily) 270 tablet 1  . apixaban (ELIQUIS) 5 MG TABS tablet Take 1 tablet (5 mg total) by mouth 2 (two) times daily. (Patient not taking: Reported on 09/18/2015) 60 tablet 6  . Linaclotide (LINZESS) 290 MCG CAPS capsule Take 1 capsule (290 mcg total) by mouth daily. (Patient not taking: Reported on 09/18/2015) 30 capsule 11  . mirtazapine (REMERON) 15 MG tablet Take 1 tablet (15 mg total) by mouth at bedtime. (Patient not taking: Reported on 09/18/2015) 30 tablet 11  . sucralfate (CARAFATE) 1 GM/10ML suspension Take 10 mLs (1 g total) by mouth 4 (four) times daily -  with meals and at bedtime. (Patient not taking: Reported on 09/18/2015) 420 mL 0  . denosumab (PROLIA) 60 MG/ML SOLN injection Inject 60 mg into the skin every 6 (six) months. Administer in upper arm, thigh, or abdomen (Patient not taking: Reported on 09/18/2015) 1.8 mL 6   No facility-administered medications prior to visit.    ROS Review of Systems  Constitutional: Negative for chills, activity change, appetite change, fatigue and unexpected weight change.  HENT: Negative for congestion, mouth sores and sinus pressure.   Eyes: Negative for visual disturbance.  Respiratory: Negative for cough and chest tightness.  Gastrointestinal: Negative for nausea and abdominal pain.  Genitourinary: Negative for frequency, difficulty urinating and vaginal pain.  Musculoskeletal: Negative for back pain and gait problem.  Skin: Negative for pallor and rash.  Neurological: Negative for dizziness, tremors, weakness, numbness and headaches.  Psychiatric/Behavioral: Negative for suicidal ideas, confusion and sleep disturbance.    Objective:  BP 128/70 mmHg  Pulse 74  Wt 127 lb (57.607 kg)  SpO2 97%  BP Readings from Last 3 Encounters:  09/18/15 128/70  09/02/15 120/77  08/23/15 140/84    Wt Readings from Last 3 Encounters:  09/18/15 127 lb (57.607 kg)    08/23/15 126 lb (57.153 kg)  08/14/15 127 lb 12.8 oz (57.97 kg)    Physical Exam  Constitutional: She appears well-developed. No distress.  HENT:  Head: Normocephalic.  Right Ear: External ear normal.  Left Ear: External ear normal.  Nose: Nose normal.  Mouth/Throat: Oropharynx is clear and moist.  Eyes: Conjunctivae are normal. Pupils are equal, round, and reactive to light. Right eye exhibits no discharge. Left eye exhibits no discharge.  Neck: Normal range of motion. Neck supple. No JVD present. No tracheal deviation present. No thyromegaly present.  Cardiovascular: Normal rate, regular rhythm and normal heart sounds.   Pulmonary/Chest: No stridor. No respiratory distress. She has no wheezes.  Abdominal: Soft. Bowel sounds are normal. She exhibits no distension and no mass. There is no tenderness. There is no rebound and no guarding.  Musculoskeletal: She exhibits tenderness. She exhibits no edema.  Lymphadenopathy:    She has no cervical adenopathy.  Neurological: She displays normal reflexes. No cranial nerve deficit. She exhibits normal muscle tone. Coordination abnormal.  Skin: No rash noted. No erythema.  Psychiatric: She has a normal mood and affect. Her behavior is normal. Judgment and thought content normal.  LS is tender Using a walker  Lab Results  Component Value Date   WBC 4.8 09/01/2015   HGB 13.1 09/01/2015   HCT 39.8 09/01/2015   PLT 222 09/01/2015   GLUCOSE 89 09/18/2015   CHOL 189 10/30/2014   TRIG 151.0* 10/30/2014   HDL 41.30 10/30/2014   LDLDIRECT 178.0 09/24/2011   LDLCALC 118* 10/30/2014   ALT 19 09/01/2015   AST 24 09/01/2015   NA 140 09/18/2015   K 3.9 09/18/2015   CL 104 09/18/2015   CREATININE 0.70 09/18/2015   BUN 13 09/18/2015   CO2 29 09/18/2015   TSH 1.991 02/01/2015   INR 3.4 09/03/2015    Ct Abdomen Pelvis W Contrast  09/02/2015  CLINICAL DATA:  Chronic left flank pain for several months. Nausea and constipation. Initial  encounter. EXAM: CT ABDOMEN AND PELVIS WITH CONTRAST TECHNIQUE: Multidetector CT imaging of the abdomen and pelvis was performed using the standard protocol following bolus administration of intravenous contrast. CONTRAST:  154m OMNIPAQUE IOHEXOL 300 MG/ML  SOLN COMPARISON:  CT of the abdomen and pelvis from 05/03/2015 FINDINGS: The visualized lung bases are clear. Bilateral breast implants are partially imaged. Small hypodensities within the liver are nonspecific, measuring up to 6 mm. A few calcified granulomata are suggested within the liver. The spleen is unremarkable in appearance. The gallbladder is within normal limits. The pancreas and adrenal glands are unremarkable. The kidneys are unremarkable in appearance. There is no evidence of hydronephrosis. No renal or ureteral stones are seen. No perinephric stranding is appreciated. No free fluid is identified. The small bowel is unremarkable in appearance. The stomach is within normal limits. No acute vascular abnormalities are seen. The appendix  is normal in caliber, without evidence of appendicitis. Contrast progresses to the level of the ascending colon. Scattered diverticulosis is noted along the sigmoid colon, without evidence of diverticulitis. The bladder is mildly distended and grossly unremarkable. The patient is status post hysterectomy. No suspicious adnexal masses are seen. No inguinal lymphadenopathy is seen. No acute osseous abnormalities are identified. There is chronic compression deformity of vertebral body L1, with associated vertebroplasty. Mild multilevel disc space narrowing and vacuum phenomenon are noted along the mid lumbar spine. IMPRESSION: 1. No acute abnormality seen to explain the patient's symptoms. 2. Scattered diverticulosis along the sigmoid colon, without evidence of diverticulitis. 3. Nonspecific small hypodensities within the liver, measuring up to 6 mm. 4. Chronic compression deformity of vertebral body L1, with changes of  vertebroplasty. Mild degenerative change along the lumbar spine. Electronically Signed   By: Garald Balding M.D.   On: 09/02/2015 00:41    Assessment & Plan:   Diagnoses and all orders for this visit:  Osteoporosis -     Basic metabolic panel; Future -     VITAMIN D 25 Hydroxy (Vit-D Deficiency, Fractures); Future  Essential hypertension  Vitamin D deficiency -     VITAMIN D 25 Hydroxy (Vit-D Deficiency, Fractures); Future  Closed wedge compression fracture of fourth lumbar vertebra, sequela  B12 deficiency -     cyanocobalamin ((VITAMIN B-12)) injection 1,000 mcg; Inject 1 mL (1,000 mcg total) into the muscle once.  Other orders -     denosumab (PROLIA) 60 MG/ML SOLN injection; Inject 60 mg into the skin every 6 (six) months. Administer in upper arm, thigh, or abdomen Unable to tolerate oral meds Dx severe osteoporosis -     flecainide (TAMBOCOR) 50 MG tablet; Take 0.5 tablets (25 mg total) by mouth once.  I have changed Ms. Candella's denosumab and flecainide. I am also having her maintain her Cholecalciferol, Cyanocobalamin (VITAMIN B-12 IJ), hydrocortisone, losartan, amoxicillin, carvedilol, ondansetron, apixaban, Linaclotide, diazepam, alum & mag hydroxide-simeth, Simethicone (GAS-X PO), famotidine, warfarin, mirtazapine, traMADol, and sucralfate. We administered cyanocobalamin.  Meds ordered this encounter  Medications  . denosumab (PROLIA) 60 MG/ML SOLN injection    Sig: Inject 60 mg into the skin every 6 (six) months. Administer in upper arm, thigh, or abdomen Unable to tolerate oral meds Dx severe osteoporosis    Dispense:  1.8 mL    Refill:  6  . flecainide (TAMBOCOR) 50 MG tablet    Sig: Take 0.5 tablets (25 mg total) by mouth once.    Dispense:  45 tablet    Refill:  3  . cyanocobalamin ((VITAMIN B-12)) injection 1,000 mcg    Sig:      Follow-up: Return in about 6 weeks (around 10/30/2015) for a follow-up visit.  Walker Kehr, MD

## 2015-09-18 NOTE — Assessment & Plan Note (Signed)
1/17 pt must have Prolia

## 2015-09-18 NOTE — Patient Instructions (Addendum)
Read about Strontium for osteoporosis Chair exercises - improve your cor muscle strength   Use over-the-counter  "cold" medicines  such as " Delsym" or" Robitussin" cough syrup varietis for cough.  You can use plain "Tylenol" or "Advil" for fever, chills and achyness. Use Halls or Ricola cough drops.   "Common cold" symptoms are usually triggered by a virus.  The antibiotics are usually not necessary. On average, a" viral cold" illness would take 4-7 days to resolve.

## 2015-09-18 NOTE — Telephone Encounter (Signed)
Pt was told by insurance that this was denied. She plans on fighting this and wants to talk with you first.  Can you please give her a call at [phone]

## 2015-09-18 NOTE — Assessment & Plan Note (Signed)
Chronic  On Coreg at hs Losartan 1/2 tab prn

## 2015-09-19 ENCOUNTER — Other Ambulatory Visit: Payer: Self-pay | Admitting: Internal Medicine

## 2015-09-19 MED ORDER — CHOLECALCIFEROL 25 MCG (1000 UT) PO TABS
2000.0000 [IU] | ORAL_TABLET | Freq: Every day | ORAL | Status: DC
Start: 2015-09-19 — End: 2016-07-13

## 2015-09-20 NOTE — Telephone Encounter (Signed)
I'm not sure why they told her that?  Possibly there was a miscommunication some where [employer] are funny about codes sometimes, so maybe that's where the breakdown was).  I have a verification that states w/out an OV her estimated responsibility will be a $15 co-pay; w/out an OV her estimated responsibility will be $65.  Please make pt aware this is an estimate and we will not know an exact amt until her insurance has paid. I have sent a copy of the summary of benefits to be scanned into her chart.  Once pt rec's injection, please send me actual injection date so I can update the Prolia portal.  If you have any questions, please let me know.  Thank you.

## 2015-09-20 NOTE — Telephone Encounter (Signed)
Pt has been informed regarding the prolia shot. She has an appt on 3/13 and she would like to get the injection then.

## 2015-09-23 NOTE — Telephone Encounter (Signed)
Ok Thx 

## 2015-09-26 ENCOUNTER — Telehealth: Payer: Self-pay

## 2015-09-26 NOTE — Telephone Encounter (Signed)
PCP has order Prolia for this pt. Can you start the PA for this please?

## 2015-09-27 ENCOUNTER — Encounter: Payer: Self-pay | Admitting: Internal Medicine

## 2015-09-27 ENCOUNTER — Ambulatory Visit (INDEPENDENT_AMBULATORY_CARE_PROVIDER_SITE_OTHER): Payer: Medicare Other | Admitting: General Practice

## 2015-09-27 ENCOUNTER — Ambulatory Visit (INDEPENDENT_AMBULATORY_CARE_PROVIDER_SITE_OTHER): Payer: Medicare Other | Admitting: Internal Medicine

## 2015-09-27 VITALS — BP 122/62 | HR 56 | Ht 62.0 in | Wt 128.0 lb

## 2015-09-27 DIAGNOSIS — M792 Neuralgia and neuritis, unspecified: Secondary | ICD-10-CM

## 2015-09-27 DIAGNOSIS — R109 Unspecified abdominal pain: Secondary | ICD-10-CM

## 2015-09-27 DIAGNOSIS — K5730 Diverticulosis of large intestine without perforation or abscess without bleeding: Secondary | ICD-10-CM

## 2015-09-27 DIAGNOSIS — Z5181 Encounter for therapeutic drug level monitoring: Secondary | ICD-10-CM | POA: Diagnosis not present

## 2015-09-27 DIAGNOSIS — K219 Gastro-esophageal reflux disease without esophagitis: Secondary | ICD-10-CM | POA: Diagnosis not present

## 2015-09-27 DIAGNOSIS — K224 Dyskinesia of esophagus: Secondary | ICD-10-CM

## 2015-09-27 DIAGNOSIS — I4891 Unspecified atrial fibrillation: Secondary | ICD-10-CM | POA: Diagnosis not present

## 2015-09-27 LAB — POCT INR: INR: 2.5

## 2015-09-27 NOTE — Patient Instructions (Signed)
Continue Gabapentin three times daily  Continue increased fiber intake.  Take your pepcid 20 mg every evening.  Please follow up with Dr Hilarie Fredrickson in 3-4 months.

## 2015-09-27 NOTE — Progress Notes (Signed)
Subjective:    Patient ID: Alyssa Luna, female    DOB: 08-27-1933, 82 y.o.   MRN: [id number]  HPI Alyssa Luna is an 82 year old female with a past medical history of GERD, hiatal hernia, esophageal dysmotility, severe diverticulosis, atrial fibrillation on Coumadin, kyphosis, disc disease and history of vertebral fracture who is here for follow-up. She also has a remote history of breast cancer. She was seen by Nicoletta Ba, PA-C on 07/30/2015 to evaluate exacerbation of GERD and also constipation. She states that her GERD exacerbation improved and she feels that this was related to Fosamax. She states "it about killed me". She has been taking famotidine 20 mg in the evening. When she uses this in the morning she feels somewhat dizzy. She was on Nexium for some time but stopped this several weeks after she felt better after her visit with Amy. She does have issues with dysphagia which comes and goes both solids and liquids. Right now she feels that this is considerably better. One of her biggest complaint is achy left middle and lower quadrant abdominal pain over the last year. At times it has been severe enough and she just feels "sick". He can be associated with nausea but not vomiting. She reports her appetite has been very good and she wants T. She is now no longer constipated and is eating prunes in the evening and has increased fruit in her diet. She is avoiding foods like cheese. She's had no diarrhea. She's having 2-3 bowel movements a day which occasionally requires straining. She denies blood in her stool or melena. She was given Zofran at her last office visit and this caused severe constipation. She tried MiraLAX but is now off of this because it caused perianal burning with bowel movement.  She went to the ER on 09/03/2015 to evaluate this left-sided abdominal pain and nausea. CT was done which was unremarkable for acute abnormality. It did show diverticulosis in the left colon  without diverticulitis. Labs are unremarkable.  She had been previously prescribed gabapentin in June 2016 by orthopedics and also by Dr. Alain Marion.  She was told her left-sided abdominal pain may be radiating referred pain from her vertebral fractures and "nerve pain". She was given 100 mg gabapentin to take 3 times a day. She decided to start this yesterday evening took one dose yesterday evening and this morning in her left middle and lower quadrant abdominal pain is gone. She is shocked but very happy.  Upper endoscopy and colonoscopy reviewed today including with the patient. Upper endoscopy was performed on 06/30/2012. This showed a 5 cm hiatal hernia and a Schatzki's ring which was widely open. Dilation was performed to 15 mm with balloon. The mucosa of the stomach and small bowel appeared normal. Colonoscopy performed on the same day showed a 5 mm ascending colon polyp removed with a cold snare severe diverticulosis in the ascending and left colon. Small internal hemorrhoids were seen.  Review of Systems As per history of present illness, otherwise negative  Current Medications, Allergies, Past Medical History, Past Surgical History, Family History and Social History were reviewed in [employer] record.     Objective:   Physical Exam BP 122/62 mmHg  Pulse 56  Ht 5' 2"  (1.575 m)  Wt 128 lb (58.06 kg)  BMI 23.41 kg/m2 Constitutional: Well-developed, elderly and well-nourished. No distress. HEENT: Normocephalic and atraumatic. Oropharynx is clear and moist. No oropharyngeal exudate. Conjunctivae are normal.  No scleral icterus. Neck: Neck supple.  Trachea midline. Cardiovascular: Normal rate, regular rhythm and intact distal pulses. No M/R/G Pulmonary/chest: Effort normal and breath sounds normal. No wheezing, rales or rhonchi. Abdominal: Soft, mild left middle and lower quadrant discomfort without rebound or guarding, nondistended. Bowel sounds active throughout.    Extremities: no clubbing, cyanosis, or edema Neurological: Alert and oriented to person place and time. Skin: Skin is warm and dry.  Psychiatric: Normal mood and affect. Behavior is normal.  CBC    Component Value Date/Time   WBC 4.8 09/01/2015 1946   RBC 4.64 09/01/2015 1946   HGB 13.1 09/01/2015 1946   HCT 39.8 09/01/2015 1946   PLT 222 09/01/2015 1946   MCV 85.8 09/01/2015 1946   MCH 28.2 09/01/2015 1946   MCHC 32.9 09/01/2015 1946   RDW 13.8 09/01/2015 1946   LYMPHSABS 1.6 02/01/2015 1630   MONOABS 0.3 02/01/2015 1630   EOSABS 0.1 02/01/2015 1630   BASOSABS 0.0 02/01/2015 1630    CMP     Component Value Date/Time   NA 140 09/18/2015 1543   K 3.9 09/18/2015 1543   CL 104 09/18/2015 1543   CO2 29 09/18/2015 1543   GLUCOSE 89 09/18/2015 1543   BUN 13 09/18/2015 1543   CREATININE 0.70 09/18/2015 1543   CALCIUM 9.4 09/18/2015 1543   PROT 6.7 09/01/2015 1946   ALBUMIN 4.0 09/01/2015 1946   AST 24 09/01/2015 1946   ALT 19 09/01/2015 1946   ALKPHOS 53 09/01/2015 1946   BILITOT 0.9 09/01/2015 1946   GFRNONAA >60 09/01/2015 1946   GFRAA >60 09/01/2015 1946    H. pylori breath test negative in the summer 2016   ESOPHOGRAM / BARIUM SWALLOW / BARIUM TABLET STUDY   TECHNIQUE: Combined double contrast and single contrast examination performed using effervescent crystals, thick barium liquid, and thin barium liquid. The patient was observed with fluoroscopy swallowing a 13 mm barium sulphate tablet.   FLUOROSCOPY TIME:  Fluoroscopy Time:  4 minutes and 3 seconds   Number of Acquired Images:  0   COMPARISON:  Abdominal pelvic CT of 05/17/2008   FINDINGS: Hypopharyngeal portion of the exam demonstrates flash penetration, including on series 3. Stasis of contrast within the piriform sinuses and vallecula.   Double contrast evaluation of the esophagus demonstrates no mucosal abnormality.   Evaluation of primary peristalsis demonstrates mild to  moderate esophageal dysmotility, with an incomplete primary peristaltic wave and contrast stasis in the lower thoracic esophagus.   Fall caliber evaluation of the esophagus demonstrates a small hiatal hernia. An area of persistent mild narrowing/ underdistention at the gastroesophageal junction. Example series 24 on series 37.   A 13 mm barium tablet passes promptly.   IMPRESSION: 1. Small hiatal hernia. 2. Esophageal dysmotility, likely presbyesophagus. 3. Mild narrowing/underdistention at the gastroesophageal junction. Cannot exclude mild peptic type stricture. Given prompt passage of a 13 mm barium tablet, this is of indeterminate clinical significance. 4. Flash penetration with swallowing. Piriform sinus and vallecular post swallow stasis. These findings may put the patient at risk for aspiration. If this is a clinical concern, consider speech pathology evaluation.     Electronically Signed   By: Abigail Miyamoto M.D.   On: 01/10/2015 11:06   CT ABDOMEN AND PELVIS WITH CONTRAST   TECHNIQUE: Multidetector CT imaging of the abdomen and pelvis was performed using the standard protocol following bolus administration of intravenous contrast.   CONTRAST:  193m OMNIPAQUE IOHEXOL 300 MG/ML  SOLN   COMPARISON:  CT of the abdomen and pelvis from  05/03/2015   FINDINGS: The visualized lung bases are clear. Bilateral breast implants are partially imaged.   Small hypodensities within the liver are nonspecific, measuring up to 6 mm. A few calcified granulomata are suggested within the liver. The spleen is unremarkable in appearance. The gallbladder is within normal limits. The pancreas and adrenal glands are unremarkable.   The kidneys are unremarkable in appearance. There is no evidence of hydronephrosis. No renal or ureteral stones are seen. No perinephric stranding is appreciated.   No free fluid is identified. The small bowel is unremarkable in appearance. The stomach is  within normal limits. No acute vascular abnormalities are seen.   The appendix is normal in caliber, without evidence of appendicitis. Contrast progresses to the level of the ascending colon. Scattered diverticulosis is noted along the sigmoid colon, without evidence of diverticulitis.   The bladder is mildly distended and grossly unremarkable. The patient is status post hysterectomy. No suspicious adnexal masses are seen. No inguinal lymphadenopathy is seen.   No acute osseous abnormalities are identified. There is chronic compression deformity of vertebral body L1, with associated vertebroplasty. Mild multilevel disc space narrowing and vacuum phenomenon are noted along the mid lumbar spine.   IMPRESSION: 1. No acute abnormality seen to explain the patient's symptoms. 2. Scattered diverticulosis along the sigmoid colon, without evidence of diverticulitis. 3. Nonspecific small hypodensities within the liver, measuring up to 6 mm. 4. Chronic compression deformity of vertebral body L1, with changes of vertebroplasty. Mild degenerative change along the lumbar spine.     Electronically Signed   By: Garald Balding M.D.   On: 09/02/2015 00:41     Assessment & Plan:  82 year old female with a past medical history of GERD, hiatal hernia, esophageal dysmotility, severe diverticulosis, atrial fibrillation on Coumadin, kyphosis, disc disease and history of vertebral fracture who is here for follow-up.  1. Left-sided abd pain -- her left-sided abdominal pain could be neuropathic type pain from her spine and disc disease. Other possibility includes segmental colitis associated with diverticulosis. Recent scan did not show diverticulitis. Pain has resolved after 2 doses of gabapentin which is very reassuring and would suggest a neuropathic type pain. I've encouraged that she use this medication as prescribed 100 mg 3 times a day. If pain stays resolved she can continue this medication. If it  comes back consider treatment with Lialda for possible segmental colitis. I have encouraged her to continue high-fiber diet and prunes to ensure regular bowel movements. No evidence of constipation recently. She is up-to-date with colonoscopy and likely will not require further surveillance based on age  47. GERD and esophageal dysmotility -- reflux has improved significantly. She is tolerating famotidine 20 mg each evening and will continue this dose. We discussed esophageal dysmotility at length today and how this has no great treatment. Controlling reflux can help dysmotility to an extent. Dysmotility felt secondary to presbyesophagus. Small bites, chew well, drink fluids after solid food to help with esophageal clearance. Remain upright 90 minutes after eating.  Office follow-up in 3-4 months, sooner if necessary If pain returns may consider Lialda without office visit first 25 minutes spent with the patient today. Greater than 50% was spent in counseling and coordination of care with the patient

## 2015-09-27 NOTE — Progress Notes (Signed)
Pre visit review using our clinic review tool, if applicable. No additional management support is needed unless otherwise documented below in the visit note. 

## 2015-09-27 NOTE — Progress Notes (Signed)
I have reviewed and agree with the plan.

## 2015-09-30 ENCOUNTER — Ambulatory Visit: Payer: Medicare Other | Admitting: Internal Medicine

## 2015-10-02 NOTE — Telephone Encounter (Signed)
This has already been taken care of and verification rec'd back.  Please see original telephone note dated 08/27/2015.  Thank you.

## 2015-10-02 NOTE — Telephone Encounter (Signed)
Correction on cost: w/out an OV estimated co-pay will be $15; WITH an OV estimated co-pay will be $65.

## 2015-10-04 ENCOUNTER — Ambulatory Visit: Payer: Medicare Other | Admitting: Internal Medicine

## 2015-10-14 ENCOUNTER — Telehealth: Payer: Self-pay | Admitting: Internal Medicine

## 2015-10-14 NOTE — Telephone Encounter (Signed)
Pt states that her left sided abdominal pain is still bothering her. Pt also states she is having a lot of belching and wants to know if there are any tests that can be done for this. Please advise.

## 2015-10-14 NOTE — Telephone Encounter (Signed)
Given her left-sided abdominal pain, known diverticulosis in the left colon, bloating and belching -- I recommend treatment with metronidazole 250 mg 3 times a day 10 days for possible bacterial overgrowth and possible low-grade diverticular inflammation. Have her call us after 10 days to let me know if she is better and if not consider trial of Lialda

## 2015-10-15 NOTE — Telephone Encounter (Signed)
Left message for pt to call back  °

## 2015-10-15 NOTE — Telephone Encounter (Signed)
Pt has allergy listed of Augmentin, Flagyl, and Cipro. Please advise what you would like to give her now.

## 2015-10-16 MED ORDER — RIFAXIMIN 550 MG PO TABS
550.0000 mg | ORAL_TABLET | Freq: Three times a day (TID) | ORAL | Status: DC
Start: 2015-10-16 — End: 2015-12-27

## 2015-10-16 NOTE — Telephone Encounter (Signed)
Rifaximin 550 mg tid x 10 days

## 2015-10-16 NOTE — Telephone Encounter (Signed)
Script sent to [facility] and pt aware.

## 2015-10-18 ENCOUNTER — Ambulatory Visit (INDEPENDENT_AMBULATORY_CARE_PROVIDER_SITE_OTHER): Payer: Medicare Other | Admitting: General Practice

## 2015-10-18 DIAGNOSIS — Z5181 Encounter for therapeutic drug level monitoring: Secondary | ICD-10-CM

## 2015-10-18 LAB — POCT INR: INR: 1.7

## 2015-10-18 NOTE — Progress Notes (Signed)
Pre visit review using our clinic review tool, if applicable. No additional management support is needed unless otherwise documented below in the visit note. 

## 2015-10-18 NOTE — Progress Notes (Signed)
I have reviewed and agree with the plan.

## 2015-10-22 ENCOUNTER — Ambulatory Visit (INDEPENDENT_AMBULATORY_CARE_PROVIDER_SITE_OTHER): Payer: Medicare Other | Admitting: *Deleted

## 2015-10-22 DIAGNOSIS — E538 Deficiency of other specified B group vitamins: Secondary | ICD-10-CM

## 2015-10-22 MED ORDER — CYANOCOBALAMIN 1000 MCG/ML IJ SOLN
1000.0000 ug | Freq: Once | INTRAMUSCULAR | Status: AC
  Administered 2015-10-22: 1000 ug via INTRAMUSCULAR

## 2015-10-23 ENCOUNTER — Telehealth: Payer: Self-pay | Admitting: Internal Medicine

## 2015-10-23 NOTE — Telephone Encounter (Signed)
Left message for pt to call back  °

## 2015-10-23 NOTE — Telephone Encounter (Signed)
Alyssa Luna has been denied by insurance. What would you suggest for pt now with all of her allergies? Please advise.

## 2015-10-23 NOTE — Telephone Encounter (Signed)
Please ask patient if she is allergic to PCN, specially amoxicillin. She has listed allergy to Augmentin, which contains amoxicillin, but sometimes it is the clavulanic acid causing allergy Has she taken another penicillin without difficulty?

## 2015-10-28 ENCOUNTER — Ambulatory Visit (INDEPENDENT_AMBULATORY_CARE_PROVIDER_SITE_OTHER): Payer: Medicare Other | Admitting: Internal Medicine

## 2015-10-28 ENCOUNTER — Encounter: Payer: Self-pay | Admitting: Internal Medicine

## 2015-10-28 VITALS — BP 134/80 | HR 62 | Wt 131.0 lb

## 2015-10-28 DIAGNOSIS — E538 Deficiency of other specified B group vitamins: Secondary | ICD-10-CM | POA: Diagnosis not present

## 2015-10-28 DIAGNOSIS — M5440 Lumbago with sciatica, unspecified side: Secondary | ICD-10-CM

## 2015-10-28 DIAGNOSIS — I10 Essential (primary) hypertension: Secondary | ICD-10-CM | POA: Diagnosis not present

## 2015-10-28 DIAGNOSIS — E559 Vitamin D deficiency, unspecified: Secondary | ICD-10-CM

## 2015-10-28 DIAGNOSIS — G8929 Other chronic pain: Secondary | ICD-10-CM

## 2015-10-28 DIAGNOSIS — I480 Paroxysmal atrial fibrillation: Secondary | ICD-10-CM | POA: Diagnosis not present

## 2015-10-28 DIAGNOSIS — R110 Nausea: Secondary | ICD-10-CM

## 2015-10-28 DIAGNOSIS — G459 Transient cerebral ischemic attack, unspecified: Secondary | ICD-10-CM | POA: Diagnosis not present

## 2015-10-28 MED ORDER — GABAPENTIN 100 MG PO CAPS: 100.0000 mg | ORAL_CAPSULE | Freq: Three times a day (TID) | ORAL | Status: DC

## 2015-10-28 MED ORDER — PANCRELIPASE (LIP-PROT-AMYL) 36000-114000 UNITS PO CPEP: 36000.0000 [IU] | ORAL_CAPSULE | Freq: Three times a day (TID) | ORAL | Status: DC

## 2015-10-28 NOTE — Assessment & Plan Note (Addendum)
No relapse Dr Leonides Schanz at [facility], [place], [place]

## 2015-10-28 NOTE — Progress Notes (Signed)
Subjective:  Patient ID: Alyssa Luna, female    DOB: [birth year]  Age: 81 y.o. MRN: [id number]  CC: No chief complaint on file.   HPI Alyssa Luna presents for nausea, gas issue f/u - no change.  F/u GERD, HTN, A fib, anticoagulation Pt is better on Gabapentin  Outpatient Prescriptions Prior to Visit  Medication Sig Dispense Refill  . alum & mag hydroxide-simeth (MAALOX/MYLANTA) 200-200-20 MG/5ML suspension Take by mouth every 6 (six) hours as needed for indigestion or heartburn.    [name] amoxicillin (AMOXIL) 500 MG capsule Reported on 09/27/2015  0  . bismuth subsalicylate (PEPTO BISMOL) 262 MG/15ML suspension Take 30 mLs by mouth as needed.    . carvedilol (COREG) 3.125 MG tablet Take 1 tablet (3.125 mg total) by mouth 2 (two) times daily. 180 tablet 3  . Cholecalciferol 1000 units tablet Take 2 tablets (2,000 Units total) by mouth daily. D-3 100 tablet 11  . Cyanocobalamin (VITAMIN B-12 IJ) Inject as directed every 30 (thirty) days.    [name] denosumab (PROLIA) 60 MG/ML SOLN injection Inject 60 mg into the skin every 6 (six) months. Administer in upper arm, thigh, or abdomen Unable to tolerate oral meds Dx severe osteoporosis 1.8 mL 6  . famotidine (PEPCID) 20 MG tablet Take 1 tablet (20 mg total) by mouth 2 (two) times daily. (Patient taking differently: Take 20 mg by mouth at bedtime. ) 60 tablet 3  . flecainide (TAMBOCOR) 50 MG tablet Take 0.5 tablets (25 mg total) by mouth once. 45 tablet 3  . hydrocortisone 2.5 % cream Apply 1 application topically 3 (three) times daily as needed (itching).    [name] losartan (COZAAR) 50 MG tablet Take 0.5 tablets (25 mg total) by mouth daily. (Patient taking differently: Take 25 mg by mouth daily as needed (BP over 150). ) 30 tablet 5  . Simethicone (GAS-X PO) Take 1 tablet by mouth daily as needed (flatulence). Reported on 09/01/2015    . GABAPENTIN PO Take by mouth as needed.    . diazepam (VALIUM) 2 MG tablet Take 1 mg by mouth at bedtime.  Reported on 10/28/2015    . rifaximin (XIFAXAN) 550 MG TABS tablet Take 1 tablet (550 mg total) by mouth 3 (three) times daily. (Patient not taking: Reported on 10/28/2015) 30 tablet 0  . traMADol (ULTRAM) 50 MG tablet Take 0.5 tablets (25 mg total) by mouth every 6 (six) hours as needed for moderate pain or severe pain. (Patient not taking: Reported on 10/28/2015) 60 tablet 3  . warfarin (COUMADIN) 3 MG tablet Take 3-6 mg by mouth daily. Reported on 10/28/2015    . apixaban (ELIQUIS) 5 MG TABS tablet Take 1 tablet (5 mg total) by mouth 2 (two) times daily. (Patient not taking: Reported on 10/28/2015) 60 tablet 6   No facility-administered medications prior to visit.    ROS Review of Systems  Constitutional: Negative for chills, activity change, appetite change, fatigue and unexpected weight change.  HENT: Negative for congestion, mouth sores and sinus pressure.   Eyes: Negative for visual disturbance.  Respiratory: Negative for cough and chest tightness.   Gastrointestinal: Negative for nausea and abdominal pain.  Genitourinary: Negative for frequency, decreased urine volume, difficulty urinating and vaginal pain.  Musculoskeletal: Negative for back pain and gait problem.  Skin: Negative for pallor, rash and wound.  Neurological: Negative for dizziness, tremors, weakness, numbness and headaches.  Psychiatric/Behavioral: Positive for sleep disturbance. Negative for suicidal ideas and confusion. The patient is nervous/anxious.  Objective:  BP 134/80 mmHg  Pulse 62  Wt 131 lb (59.421 kg)  SpO2 97%  BP Readings from Last 3 Encounters:  10/28/15 134/80  09/27/15 122/62  09/18/15 128/70    Wt Readings from Last 3 Encounters:  10/28/15 131 lb (59.421 kg)  09/27/15 128 lb (58.06 kg)  09/18/15 127 lb (57.607 kg)    Physical Exam  Constitutional: She appears well-developed. No distress.  HENT:  Head: Normocephalic.  Right Ear: External ear normal.  Left Ear: External ear normal.    Nose: Nose normal.  Mouth/Throat: Oropharynx is clear and moist.  Eyes: Conjunctivae are normal. Pupils are equal, round, and reactive to light. Right eye exhibits no discharge. Left eye exhibits no discharge.  Neck: Normal range of motion. Neck supple. No JVD present. No tracheal deviation present. No thyromegaly present.  Cardiovascular: Normal rate, regular rhythm and normal heart sounds.   Pulmonary/Chest: No stridor. No respiratory distress. She has no wheezes.  Abdominal: Soft. Bowel sounds are normal. She exhibits no distension and no mass. There is no tenderness. There is no rebound and no guarding.  Musculoskeletal: She exhibits no edema or tenderness.  Lymphadenopathy:    She has no cervical adenopathy.  Neurological: She displays normal reflexes. No cranial nerve deficit. She exhibits normal muscle tone. Coordination normal.  Skin: No rash noted. No erythema.  Psychiatric: She has a normal mood and affect. Her behavior is normal. Judgment and thought content normal.  NAD abd NT  Lab Results  Component Value Date   WBC 4.8 09/01/2015   HGB 13.1 09/01/2015   HCT 39.8 09/01/2015   PLT 222 09/01/2015   GLUCOSE 89 09/18/2015   CHOL 189 10/30/2014   TRIG 151.0* 10/30/2014   HDL 41.30 10/30/2014   LDLDIRECT 178.0 09/24/2011   LDLCALC 118* 10/30/2014   ALT 19 09/01/2015   AST 24 09/01/2015   NA 140 09/18/2015   K 3.9 09/18/2015   CL 104 09/18/2015   CREATININE 0.70 09/18/2015   BUN 13 09/18/2015   CO2 29 09/18/2015   TSH 1.991 02/01/2015   INR 1.7 10/18/2015    Ct Abdomen Pelvis W Contrast  09/02/2015  CLINICAL DATA:  Chronic left flank pain for several months. Nausea and constipation. Initial encounter. EXAM: CT ABDOMEN AND PELVIS WITH CONTRAST TECHNIQUE: Multidetector CT imaging of the abdomen and pelvis was performed using the standard protocol following bolus administration of intravenous contrast. CONTRAST:  163m OMNIPAQUE IOHEXOL 300 MG/ML  SOLN COMPARISON:  CT of  the abdomen and pelvis from 05/03/2015 FINDINGS: The visualized lung bases are clear. Bilateral breast implants are partially imaged. Small hypodensities within the liver are nonspecific, measuring up to 6 mm. A few calcified granulomata are suggested within the liver. The spleen is unremarkable in appearance. The gallbladder is within normal limits. The pancreas and adrenal glands are unremarkable. The kidneys are unremarkable in appearance. There is no evidence of hydronephrosis. No renal or ureteral stones are seen. No perinephric stranding is appreciated. No free fluid is identified. The small bowel is unremarkable in appearance. The stomach is within normal limits. No acute vascular abnormalities are seen. The appendix is normal in caliber, without evidence of appendicitis. Contrast progresses to the level of the ascending colon. Scattered diverticulosis is noted along the sigmoid colon, without evidence of diverticulitis. The bladder is mildly distended and grossly unremarkable. The patient is status post hysterectomy. No suspicious adnexal masses are seen. No inguinal lymphadenopathy is seen. No acute osseous abnormalities are identified. There is  chronic compression deformity of vertebral body L1, with associated vertebroplasty. Mild multilevel disc space narrowing and vacuum phenomenon are noted along the mid lumbar spine. IMPRESSION: 1. No acute abnormality seen to explain the patient's symptoms. 2. Scattered diverticulosis along the sigmoid colon, without evidence of diverticulitis. 3. Nonspecific small hypodensities within the liver, measuring up to 6 mm. 4. Chronic compression deformity of vertebral body L1, with changes of vertebroplasty. Mild degenerative change along the lumbar spine. Electronically Signed   By: Garald Balding M.D.   On: 09/02/2015 00:41    Assessment & Plan:   There are no diagnoses linked to this encounter. I have discontinued Ms. Sally's apixaban and GABAPENTIN PO. I am  also having her maintain her Cyanocobalamin (VITAMIN B-12 IJ), hydrocortisone, losartan, amoxicillin, carvedilol, diazepam, alum & mag hydroxide-simeth, Simethicone (GAS-X PO), famotidine, warfarin, traMADol, denosumab, flecainide, Cholecalciferol, bismuth subsalicylate, rifaximin, Rivaroxaban, and gabapentin.  Meds ordered this encounter  Medications  . Rivaroxaban (XARELTO) 15 MG TABS tablet    Sig: Take 1 tablet by mouth daily.  [name] gabapentin (NEURONTIN) 100 MG capsule    Sig: Take 1 capsule by mouth 3 (three) times daily.    Refill:  2     Follow-up: No Follow-up on file.  Walker Kehr, MD

## 2015-10-28 NOTE — Assessment & Plan Note (Signed)
On B12 

## 2015-10-28 NOTE — Assessment & Plan Note (Addendum)
Some better  Try Creon w/meals

## 2015-10-28 NOTE — Assessment & Plan Note (Signed)
Dr Leonides Schanz at [facility], [place], [place]

## 2015-10-28 NOTE — Assessment & Plan Note (Signed)
On Vit D 

## 2015-10-28 NOTE — Assessment & Plan Note (Signed)
3/17 on Gabapentin

## 2015-10-28 NOTE — Progress Notes (Signed)
Pre visit review using our clinic review tool, if applicable. No additional management support is needed unless otherwise documented below in the visit note. 

## 2015-10-28 NOTE — Telephone Encounter (Signed)
Left message for pt to call back  °

## 2015-10-28 NOTE — Assessment & Plan Note (Signed)
Chronic  On Coreg at hs Losartan 1/2 tab prn

## 2015-10-30 ENCOUNTER — Ambulatory Visit: Payer: Self-pay | Admitting: General Practice

## 2015-10-30 DIAGNOSIS — Z5181 Encounter for therapeutic drug level monitoring: Secondary | ICD-10-CM

## 2015-10-30 NOTE — Telephone Encounter (Signed)
Spoke with pt and she states she is some better. States PCP prescribed Creon yesterday for her burping, belching, and bloating. States she does feel some better. Does not think she needs antibiotic now.

## 2015-11-01 ENCOUNTER — Ambulatory Visit: Payer: Medicare Other

## 2015-11-05 ENCOUNTER — Ambulatory Visit: Payer: Medicare Other

## 2015-11-13 ENCOUNTER — Ambulatory Visit (INDEPENDENT_AMBULATORY_CARE_PROVIDER_SITE_OTHER): Payer: Medicare Other | Admitting: *Deleted

## 2015-11-13 DIAGNOSIS — M810 Age-related osteoporosis without current pathological fracture: Secondary | ICD-10-CM

## 2015-11-13 MED ORDER — DENOSUMAB 60 MG/ML ~~LOC~~ SOLN
60.0000 mg | Freq: Once | SUBCUTANEOUS | Status: AC
  Administered 2015-11-13: 60 mg via SUBCUTANEOUS

## 2015-11-15 ENCOUNTER — Ambulatory Visit: Payer: Medicare Other

## 2015-11-26 ENCOUNTER — Ambulatory Visit (INDEPENDENT_AMBULATORY_CARE_PROVIDER_SITE_OTHER): Payer: Medicare Other | Admitting: Internal Medicine

## 2015-11-26 ENCOUNTER — Encounter: Payer: Self-pay | Admitting: Internal Medicine

## 2015-11-26 VITALS — BP 124/70 | HR 61 | Temp 97.7°F | Wt 132.0 lb

## 2015-11-26 DIAGNOSIS — I10 Essential (primary) hypertension: Secondary | ICD-10-CM | POA: Diagnosis not present

## 2015-11-26 DIAGNOSIS — K219 Gastro-esophageal reflux disease without esophagitis: Secondary | ICD-10-CM

## 2015-11-26 DIAGNOSIS — R1032 Left lower quadrant pain: Secondary | ICD-10-CM

## 2015-11-26 DIAGNOSIS — R110 Nausea: Secondary | ICD-10-CM

## 2015-11-26 DIAGNOSIS — E538 Deficiency of other specified B group vitamins: Secondary | ICD-10-CM

## 2015-11-26 MED ORDER — CYANOCOBALAMIN 1000 MCG/ML IJ SOLN
1000.0000 ug | Freq: Once | INTRAMUSCULAR | Status: AC
  Administered 2015-11-26: 1000 ug via INTRAMUSCULAR

## 2015-11-26 NOTE — Assessment & Plan Note (Signed)
Losartan 1/2 tab prn

## 2015-11-26 NOTE — Patient Instructions (Addendum)
Try Metamucil daily for your left sided abdominal pain  Use a foot stool to rest your feet when you are on the toilet pooping

## 2015-11-26 NOTE — Progress Notes (Signed)
Subjective:  Patient ID: Alyssa Luna, female    DOB: 1934/01/22  Age: 82 y.o. MRN: [id number]  CC: No chief complaint on file.   HPI IDALY VERRET presents for chronic abd pain in the LLQ at times w/nausea x 1 year, dysphagia w/large pills. Pt had a CT scan Pt saw Dr Hilarie Fredrickson.  Outpatient Prescriptions Prior to Visit  Medication Sig Dispense Refill  . alum & mag hydroxide-simeth (MAALOX/MYLANTA) 200-200-20 MG/5ML suspension Take by mouth every 6 (six) hours as needed for indigestion or heartburn.    . bismuth subsalicylate (PEPTO BISMOL) 262 MG/15ML suspension Take 30 mLs by mouth as needed.    . carvedilol (COREG) 3.125 MG tablet Take 1 tablet (3.125 mg total) by mouth 2 (two) times daily. 180 tablet 3  . Cholecalciferol 1000 units tablet Take 2 tablets (2,000 Units total) by mouth daily. D-3 100 tablet 11  . Cyanocobalamin (VITAMIN B-12 IJ) Inject as directed every 30 (thirty) days.    [name] denosumab (PROLIA) 60 MG/ML SOLN injection Inject 60 mg into the skin every 6 (six) months. Administer in upper arm, thigh, or abdomen Unable to tolerate oral meds Dx severe osteoporosis 1.8 mL 6  . diazepam (VALIUM) 2 MG tablet Take 1 mg by mouth at bedtime. Reported on 10/28/2015    . famotidine (PEPCID) 20 MG tablet Take 1 tablet (20 mg total) by mouth 2 (two) times daily. (Patient taking differently: Take 20 mg by mouth at bedtime. ) 60 tablet 3  . flecainide (TAMBOCOR) 50 MG tablet Take 0.5 tablets (25 mg total) by mouth once. 45 tablet 3  . gabapentin (NEURONTIN) 100 MG capsule Take 1 capsule (100 mg total) by mouth 3 (three) times daily. 90 capsule 11  . hydrocortisone 2.5 % cream Apply 1 application topically 3 (three) times daily as needed (itching).    [name] losartan (COZAAR) 50 MG tablet Take 0.5 tablets (25 mg total) by mouth daily. (Patient taking differently: Take 25 mg by mouth daily as needed (BP over 150). ) 30 tablet 5  . rifaximin (XIFAXAN) 550 MG TABS tablet Take 1 tablet (550 mg  total) by mouth 3 (three) times daily. 30 tablet 0  . Rivaroxaban (XARELTO) 15 MG TABS tablet Take 1 tablet by mouth daily.    . Simethicone (GAS-X PO) Take 1 tablet by mouth daily as needed (flatulence). Reported on 09/01/2015    . traMADol (ULTRAM) 50 MG tablet Take 0.5 tablets (25 mg total) by mouth every 6 (six) hours as needed for moderate pain or severe pain. 60 tablet 3  . lipase/protease/amylase (CREON) 36000 UNITS CPEP capsule Take 1 capsule (36,000 Units total) by mouth 3 (three) times daily before meals. (Patient not taking: Reported on 11/26/2015) 90 capsule 3  . amoxicillin (AMOXIL) 500 MG capsule Reported on 11/26/2015  0   No facility-administered medications prior to visit.    ROS Review of Systems  Constitutional: Negative for chills, activity change, appetite change, fatigue and unexpected weight change.  HENT: Negative for congestion, mouth sores and sinus pressure.   Eyes: Negative for visual disturbance.  Respiratory: Negative for cough and chest tightness.   Cardiovascular: Negative for leg swelling.  Gastrointestinal: Positive for nausea, abdominal pain and constipation. Negative for diarrhea, blood in stool and anal bleeding.  Genitourinary: Negative for frequency, difficulty urinating and vaginal pain.  Musculoskeletal: Negative for back pain and gait problem.  Skin: Negative for pallor and rash.  Neurological: Positive for tremors. Negative for dizziness, weakness, numbness and headaches.  Psychiatric/Behavioral: Negative for confusion and sleep disturbance. The patient is nervous/anxious.     Objective:  BP 124/70 mmHg  Pulse 61  Temp(Src) 97.7 F (36.5 C) (Oral)  Wt 132 lb (59.875 kg)  SpO2 98%  BP Readings from Last 3 Encounters:  11/26/15 124/70  10/28/15 134/80  09/27/15 122/62    Wt Readings from Last 3 Encounters:  11/26/15 132 lb (59.875 kg)  10/28/15 131 lb (59.421 kg)  09/27/15 128 lb (58.06 kg)    Physical Exam  Constitutional: She  appears well-developed. No distress.  HENT:  Head: Normocephalic.  Right Ear: External ear normal.  Left Ear: External ear normal.  Nose: Nose normal.  Mouth/Throat: Oropharynx is clear and moist.  Eyes: Conjunctivae are normal. Pupils are equal, round, and reactive to light. Right eye exhibits no discharge. Left eye exhibits no discharge.  Neck: Normal range of motion. Neck supple. No JVD present. No tracheal deviation present. No thyromegaly present.  Cardiovascular: Normal rate, regular rhythm and normal heart sounds.   Pulmonary/Chest: No stridor. No respiratory distress. She has no wheezes.  Abdominal: Soft. Bowel sounds are normal. She exhibits no distension and no mass. There is tenderness. There is no rebound and no guarding.  Musculoskeletal: She exhibits no edema or tenderness.  Lymphadenopathy:    She has no cervical adenopathy.  Neurological: She displays normal reflexes. No cranial nerve deficit. She exhibits normal muscle tone. Coordination normal.  Skin: No rash noted. No erythema.  Psychiatric: She has a normal mood and affect. Her behavior is normal. Judgment and thought content normal.  abd L side - mildly sensitive - not ender Kyphotic posture could be contributing to the abd pain  Lab Results  Component Value Date   WBC 4.8 09/01/2015   HGB 13.1 09/01/2015   HCT 39.8 09/01/2015   PLT 222 09/01/2015   GLUCOSE 89 09/18/2015   CHOL 189 10/30/2014   TRIG 151.0* 10/30/2014   HDL 41.30 10/30/2014   LDLDIRECT 178.0 09/24/2011   LDLCALC 118* 10/30/2014   ALT 19 09/01/2015   AST 24 09/01/2015   NA 140 09/18/2015   K 3.9 09/18/2015   CL 104 09/18/2015   CREATININE 0.70 09/18/2015   BUN 13 09/18/2015   CO2 29 09/18/2015   TSH 1.991 02/01/2015   INR 1.7 10/18/2015    Ct Abdomen Pelvis W Contrast  09/02/2015  CLINICAL DATA:  Chronic left flank pain for several months. Nausea and constipation. Initial encounter. EXAM: CT ABDOMEN AND PELVIS WITH CONTRAST TECHNIQUE:  Multidetector CT imaging of the abdomen and pelvis was performed using the standard protocol following bolus administration of intravenous contrast. CONTRAST:  117m OMNIPAQUE IOHEXOL 300 MG/ML  SOLN COMPARISON:  CT of the abdomen and pelvis from 05/03/2015 FINDINGS: The visualized lung bases are clear. Bilateral breast implants are partially imaged. Small hypodensities within the liver are nonspecific, measuring up to 6 mm. A few calcified granulomata are suggested within the liver. The spleen is unremarkable in appearance. The gallbladder is within normal limits. The pancreas and adrenal glands are unremarkable. The kidneys are unremarkable in appearance. There is no evidence of hydronephrosis. No renal or ureteral stones are seen. No perinephric stranding is appreciated. No free fluid is identified. The small bowel is unremarkable in appearance. The stomach is within normal limits. No acute vascular abnormalities are seen. The appendix is normal in caliber, without evidence of appendicitis. Contrast progresses to the level of the ascending colon. Scattered diverticulosis is noted along the sigmoid colon, without evidence  of diverticulitis. The bladder is mildly distended and grossly unremarkable. The patient is status post hysterectomy. No suspicious adnexal masses are seen. No inguinal lymphadenopathy is seen. No acute osseous abnormalities are identified. There is chronic compression deformity of vertebral body L1, with associated vertebroplasty. Mild multilevel disc space narrowing and vacuum phenomenon are noted along the mid lumbar spine. IMPRESSION: 1. No acute abnormality seen to explain the patient's symptoms. 2. Scattered diverticulosis along the sigmoid colon, without evidence of diverticulitis. 3. Nonspecific small hypodensities within the liver, measuring up to 6 mm. 4. Chronic compression deformity of vertebral body L1, with changes of vertebroplasty. Mild degenerative change along the lumbar spine.  Electronically Signed   By: Garald Balding M.D.   On: 09/02/2015 00:41    Assessment & Plan:   Diagnoses and all orders for this visit:  Essential hypertension  Gastroesophageal reflux disease without esophagitis  LLQ abdominal pain  Nausea without vomiting  B12 deficiency -     cyanocobalamin ((VITAMIN B-12)) injection 1,000 mcg; Inject 1 mL (1,000 mcg total) into the muscle once.  Other orders -     Cancel: Tdap vaccine greater than or equal to 7yo IM  I have discontinued Ms. Braddy's amoxicillin. I am also having her maintain her Cyanocobalamin (VITAMIN B-12 IJ), hydrocortisone, losartan, carvedilol, diazepam, alum & mag hydroxide-simeth, Simethicone (GAS-X PO), famotidine, traMADol, denosumab, flecainide, Cholecalciferol, bismuth subsalicylate, rifaximin, Rivaroxaban, gabapentin, and lipase/protease/amylase. We administered cyanocobalamin.  Meds ordered this encounter  Medications  . cyanocobalamin ((VITAMIN B-12)) injection 1,000 mcg    Sig:      Follow-up: Return in about 6 weeks (around 01/07/2016) for a follow-up visit.  Walker Kehr, MD

## 2015-11-26 NOTE — Assessment & Plan Note (Signed)
Dr Hilarie Fredrickson On Pepcid

## 2015-11-26 NOTE — Progress Notes (Signed)
Pre visit review using our clinic review tool, if applicable. No additional management support is needed unless otherwise documented below in the visit note. 

## 2015-11-26 NOTE — Assessment & Plan Note (Signed)
resolved 

## 2015-11-26 NOTE — Assessment & Plan Note (Addendum)
2016 Recurrent ?etiology - diverticulosis, ischemia, IBS (?). Abd CT - diverticulosis BM makes pain disappear, sometimes it makes it worse Xifaxan - not covered Dr Hilarie Fredrickson Kyphotic posture could be contributing to the abd pain The pt may go to [facility] to get another opinion Use a foot stool to rest your feet when you are on the toilet pooping

## 2015-12-03 ENCOUNTER — Other Ambulatory Visit: Payer: Self-pay | Admitting: Internal Medicine

## 2015-12-15 DIAGNOSIS — M810 Age-related osteoporosis without current pathological fracture: Secondary | ICD-10-CM | POA: Insufficient documentation

## 2015-12-20 ENCOUNTER — Other Ambulatory Visit: Payer: Self-pay | Admitting: Gastroenterology

## 2015-12-27 ENCOUNTER — Encounter: Payer: Self-pay | Admitting: Internal Medicine

## 2015-12-27 ENCOUNTER — Other Ambulatory Visit (INDEPENDENT_AMBULATORY_CARE_PROVIDER_SITE_OTHER): Payer: Medicare Other

## 2015-12-27 ENCOUNTER — Ambulatory Visit (INDEPENDENT_AMBULATORY_CARE_PROVIDER_SITE_OTHER): Payer: Medicare Other | Admitting: Internal Medicine

## 2015-12-27 ENCOUNTER — Ambulatory Visit: Payer: Medicare Other | Admitting: Internal Medicine

## 2015-12-27 VITALS — BP 128/86 | HR 61 | Temp 97.9°F | Resp 14 | Ht 61.5 in | Wt 135.0 lb

## 2015-12-27 DIAGNOSIS — M810 Age-related osteoporosis without current pathological fracture: Secondary | ICD-10-CM

## 2015-12-27 DIAGNOSIS — K219 Gastro-esophageal reflux disease without esophagitis: Secondary | ICD-10-CM

## 2015-12-27 DIAGNOSIS — M150 Primary generalized (osteo)arthritis: Secondary | ICD-10-CM

## 2015-12-27 DIAGNOSIS — E538 Deficiency of other specified B group vitamins: Secondary | ICD-10-CM | POA: Diagnosis not present

## 2015-12-27 DIAGNOSIS — I480 Paroxysmal atrial fibrillation: Secondary | ICD-10-CM

## 2015-12-27 DIAGNOSIS — M159 Polyosteoarthritis, unspecified: Secondary | ICD-10-CM

## 2015-12-27 DIAGNOSIS — R600 Localized edema: Secondary | ICD-10-CM

## 2015-12-27 DIAGNOSIS — I10 Essential (primary) hypertension: Secondary | ICD-10-CM

## 2015-12-27 LAB — URINALYSIS
BILIRUBIN URINE: NEGATIVE
KETONES UR: NEGATIVE
Leukocytes, UA: NEGATIVE
Nitrite: NEGATIVE
PH: 6.5 (ref 5.0–8.0)
Specific Gravity, Urine: 1.01 (ref 1.000–1.030)
TOTAL PROTEIN, URINE-UPE24: NEGATIVE
URINE GLUCOSE: NEGATIVE
Urobilinogen, UA: 0.2 (ref 0.0–1.0)

## 2015-12-27 LAB — BASIC METABOLIC PANEL
BUN: 13 mg/dL (ref 6–23)
CHLORIDE: 106 meq/L (ref 96–112)
CO2: 27 mEq/L (ref 19–32)
CREATININE: 0.65 mg/dL (ref 0.40–1.20)
Calcium: 9.1 mg/dL (ref 8.4–10.5)
GFR: 93.15 mL/min (ref >60.00)
Glucose, Bld: 86 mg/dL (ref 70–99)
Potassium: 4.1 mEq/L (ref 3.5–5.1)
Sodium: 140 mEq/L (ref 135–145)

## 2015-12-27 LAB — SEDIMENTATION RATE: SED RATE: 14 mm/h (ref 0–22)

## 2015-12-27 MED ORDER — RABEPRAZOLE SODIUM 20 MG PO TBEC: 20.0000 mg | DELAYED_RELEASE_TABLET | Freq: Every day | ORAL | Status: DC

## 2015-12-27 MED ORDER — FLECAINIDE ACETATE 50 MG PO TABS: 50.0000 mg | ORAL_TABLET | Freq: Two times a day (BID) | ORAL | Status: DC

## 2015-12-27 MED ORDER — COLCHICINE 0.6 MG PO TABS: 0.6000 mg | ORAL_TABLET | Freq: Every day | ORAL | Status: DC

## 2015-12-27 MED ORDER — CYANOCOBALAMIN 1000 MCG/ML IJ SOLN
1000.0000 ug | Freq: Once | INTRAMUSCULAR | Status: AC
  Administered 2015-12-27: 1000 ug via INTRAMUSCULAR

## 2015-12-27 NOTE — Progress Notes (Signed)
Pre visit review using our clinic review tool, if applicable. No additional management support is needed unless otherwise documented below in the visit note. 

## 2015-12-27 NOTE — Assessment & Plan Note (Signed)
[place]

## 2015-12-27 NOTE — Progress Notes (Signed)
Subjective:  Patient ID: Alyssa Luna, female    DOB: 05/09/1934  Age: 82 y.o. MRN: [id number]  CC: No chief complaint on file.   HPI Alyssa Luna presents for ankle swelling x 3 weeks. C/o leg burning, feet hurt F/u anticoagulation, GERD. Pepcid is not helping  Outpatient Prescriptions Prior to Visit  Medication Sig Dispense Refill  . alum & mag hydroxide-simeth (MAALOX/MYLANTA) 200-200-20 MG/5ML suspension Take by mouth every 6 (six) hours as needed for indigestion or heartburn.    . bismuth subsalicylate (PEPTO BISMOL) 262 MG/15ML suspension Take 30 mLs by mouth as needed.    . carvedilol (COREG) 3.125 MG tablet Take 1 tablet (3.125 mg total) by mouth 2 (two) times daily. 180 tablet 3  . Cholecalciferol 1000 units tablet Take 2 tablets (2,000 Units total) by mouth daily. D-3 100 tablet 11  . Cyanocobalamin (VITAMIN B-12 IJ) Inject as directed every 30 (thirty) days.    [name] denosumab (PROLIA) 60 MG/ML SOLN injection Inject 60 mg into the skin every 6 (six) months. Administer in upper arm, thigh, or abdomen Unable to tolerate oral meds Dx severe osteoporosis 1.8 mL 6  . famotidine (PEPCID) 20 MG tablet TAKE 1 TABLET (20 MG TOTAL) BY MOUTH 2 (TWO) TIMES DAILY. 60 tablet 3  . flecainide (TAMBOCOR) 50 MG tablet Take 0.5 tablets (25 mg total) by mouth once. 45 tablet 3  . gabapentin (NEURONTIN) 100 MG capsule Take 1 capsule (100 mg total) by mouth 3 (three) times daily. 90 capsule 11  . hydrocortisone 2.5 % cream Apply 1 application topically 3 (three) times daily as needed (itching).    [name] losartan (COZAAR) 50 MG tablet Take 0.5 tablets (25 mg total) by mouth daily. (Patient taking differently: Take 25 mg by mouth daily as needed (BP over 150). ) 30 tablet 5  . Rivaroxaban (XARELTO) 15 MG TABS tablet Take 1 tablet by mouth daily.    . Simethicone (GAS-X PO) Take 1 tablet by mouth daily as needed (flatulence). Reported on 09/01/2015    . diazepam (VALIUM) 2 MG tablet Take 1 mg by  mouth at bedtime. Reported on 12/27/2015    . lipase/protease/amylase (CREON) 36000 UNITS CPEP capsule Take 1 capsule (36,000 Units total) by mouth 3 (three) times daily before meals. (Patient not taking: Reported on 12/27/2015) 90 capsule 3  . nystatin (MYCOSTATIN) 100000 UNIT/ML suspension SWISH,HOLD AND SWALLOW ONE TEASPOONFUL FOUR TIMES A DAY (Patient not taking: Reported on 12/27/2015) 240 mL 0  . rifaximin (XIFAXAN) 550 MG TABS tablet Take 1 tablet (550 mg total) by mouth 3 (three) times daily. (Patient not taking: Reported on 12/27/2015) 30 tablet 0  . traMADol (ULTRAM) 50 MG tablet Take 0.5 tablets (25 mg total) by mouth every 6 (six) hours as needed for moderate pain or severe pain. (Patient not taking: Reported on 12/27/2015) 60 tablet 3   No facility-administered medications prior to visit.    ROS Review of Systems  Objective:  BP 128/86 mmHg  Pulse 61  Temp(Src) 97.9 F (36.6 C) (Oral)  Resp 14  Ht 5' 1.5" (1.562 m)  Wt 135 lb (61.236 kg)  BMI 25.10 kg/m2  SpO2 98%  BP Readings from Last 3 Encounters:  12/27/15 128/86  11/26/15 124/70  10/28/15 134/80    Wt Readings from Last 3 Encounters:  12/27/15 135 lb (61.236 kg)  11/26/15 132 lb (59.875 kg)  10/28/15 131 lb (59.421 kg)    Physical Exam  Lab Results  Component Value Date  WBC 4.8 09/01/2015   HGB 13.1 09/01/2015   HCT 39.8 09/01/2015   PLT 222 09/01/2015   GLUCOSE 89 09/18/2015   CHOL 189 10/30/2014   TRIG 151.0* 10/30/2014   HDL 41.30 10/30/2014   LDLDIRECT 178.0 09/24/2011   LDLCALC 118* 10/30/2014   ALT 19 09/01/2015   AST 24 09/01/2015   NA 140 09/18/2015   K 3.9 09/18/2015   CL 104 09/18/2015   CREATININE 0.70 09/18/2015   BUN 13 09/18/2015   CO2 29 09/18/2015   TSH 1.991 02/01/2015   INR 1.7 10/18/2015    Ct Abdomen Pelvis W Contrast  09/02/2015  CLINICAL DATA:  Chronic left flank pain for several months. Nausea and constipation. Initial encounter. EXAM: CT ABDOMEN AND PELVIS WITH  CONTRAST TECHNIQUE: Multidetector CT imaging of the abdomen and pelvis was performed using the standard protocol following bolus administration of intravenous contrast. CONTRAST:  119m OMNIPAQUE IOHEXOL 300 MG/ML  SOLN COMPARISON:  CT of the abdomen and pelvis from 05/03/2015 FINDINGS: The visualized lung bases are clear. Bilateral breast implants are partially imaged. Small hypodensities within the liver are nonspecific, measuring up to 6 mm. A few calcified granulomata are suggested within the liver. The spleen is unremarkable in appearance. The gallbladder is within normal limits. The pancreas and adrenal glands are unremarkable. The kidneys are unremarkable in appearance. There is no evidence of hydronephrosis. No renal or ureteral stones are seen. No perinephric stranding is appreciated. No free fluid is identified. The small bowel is unremarkable in appearance. The stomach is within normal limits. No acute vascular abnormalities are seen. The appendix is normal in caliber, without evidence of appendicitis. Contrast progresses to the level of the ascending colon. Scattered diverticulosis is noted along the sigmoid colon, without evidence of diverticulitis. The bladder is mildly distended and grossly unremarkable. The patient is status post hysterectomy. No suspicious adnexal masses are seen. No inguinal lymphadenopathy is seen. No acute osseous abnormalities are identified. There is chronic compression deformity of vertebral body L1, with associated vertebroplasty. Mild multilevel disc space narrowing and vacuum phenomenon are noted along the mid lumbar spine. IMPRESSION: 1. No acute abnormality seen to explain the patient's symptoms. 2. Scattered diverticulosis along the sigmoid colon, without evidence of diverticulitis. 3. Nonspecific small hypodensities within the liver, measuring up to 6 mm. 4. Chronic compression deformity of vertebral body L1, with changes of vertebroplasty. Mild degenerative change  along the lumbar spine. Electronically Signed   By: Alyssa BaldingM.D.   On: 09/02/2015 00:41    Assessment & Plan:   There are no diagnoses linked to this encounter. I am having Alyssa Luna maintain her Cyanocobalamin (VITAMIN B-12 IJ), hydrocortisone, losartan, carvedilol, diazepam, alum & mag hydroxide-simeth, Simethicone (GAS-X PO), traMADol, denosumab, flecainide, Cholecalciferol, bismuth subsalicylate, rifaximin, Rivaroxaban, gabapentin, lipase/protease/amylase, nystatin, and famotidine.  No orders of the defined types were placed in this encounter.     Follow-up: No Follow-up on file.  AWalker Kehr MD

## 2015-12-27 NOTE — Assessment & Plan Note (Signed)
R/o pseudogout - empiric Colchicine 1 a day

## 2015-12-27 NOTE — Assessment & Plan Note (Signed)
On Coreg at hs Losartan 1/2 tab prn

## 2015-12-27 NOTE — Assessment & Plan Note (Signed)
On Flecanide, Coreg, On Xarelto

## 2015-12-27 NOTE — Assessment & Plan Note (Signed)
Compression socks, support Could be aggravated by [facility]

## 2016-01-07 ENCOUNTER — Ambulatory Visit: Payer: Medicare Other | Admitting: Internal Medicine

## 2016-01-07 ENCOUNTER — Telehealth: Payer: Self-pay | Admitting: *Deleted

## 2016-01-07 NOTE — Telephone Encounter (Signed)
No show letter mailed to patient.

## 2016-01-16 ENCOUNTER — Encounter: Payer: Self-pay | Admitting: *Deleted

## 2016-01-16 ENCOUNTER — Ambulatory Visit (INDEPENDENT_AMBULATORY_CARE_PROVIDER_SITE_OTHER): Payer: Medicare Other | Admitting: Internal Medicine

## 2016-01-16 VITALS — BP 130/70 | HR 64 | Temp 98.2°F | Wt 134.0 lb

## 2016-01-16 DIAGNOSIS — I480 Paroxysmal atrial fibrillation: Secondary | ICD-10-CM

## 2016-01-16 DIAGNOSIS — I10 Essential (primary) hypertension: Secondary | ICD-10-CM | POA: Diagnosis not present

## 2016-01-16 DIAGNOSIS — R600 Localized edema: Secondary | ICD-10-CM | POA: Diagnosis not present

## 2016-01-16 MED ORDER — VASCULERA PO TABS: 1.0000 | ORAL_TABLET | Freq: Every day | ORAL | Status: DC

## 2016-01-16 NOTE — Assessment & Plan Note (Signed)
LLE edema - poss chronic venous insufficiency On Xarelto Vasculera info given Legs wedge Will sch [place]

## 2016-01-16 NOTE — Assessment & Plan Note (Signed)
On Xarelto 

## 2016-01-16 NOTE — Assessment & Plan Note (Signed)
On Coreg at hs Losartan 1/2 tab prn

## 2016-01-16 NOTE — Progress Notes (Signed)
Pre visit review using our clinic review tool, if applicable. No additional management support is needed unless otherwise documented below in the visit note. 

## 2016-01-16 NOTE — Patient Instructions (Addendum)
Legs wedge   Venous Stasis or Chronic Venous Insufficiency Chronic venous insufficiency, also called venous stasis, is a condition that affects the veins in the legs. The condition prevents blood from being pumped through these veins effectively. Blood may no longer be pumped effectively from the legs back to the heart. This condition can range from mild to severe. With proper treatment, you should be able to continue with an active life. CAUSES  Chronic venous insufficiency occurs when the vein walls become stretched, weakened, or damaged or when valves within the vein are damaged. Some common causes of this include:  High blood pressure inside the veins (venous hypertension).  Increased blood pressure in the leg veins from long periods of sitting or standing.  A blood clot that blocks blood flow in a vein (deep vein thrombosis).  Inflammation of a superficial vein (phlebitis) that causes a blood clot to form. RISK FACTORS Various things can make you more likely to develop chronic venous insufficiency, including:  Family history of this condition.  Obesity.  Pregnancy.  Sedentary lifestyle.  Smoking.  Jobs requiring long periods of standing or sitting in one place.  Being a certain age. Women in their 30s and 39s and men in their 37s are more likely to develop this condition. SIGNS AND SYMPTOMS  Symptoms may include:   Varicose veins.  Skin breakdown or ulcers.  Reddened or discolored skin on the leg.  Brown, smooth, tight, and painful skin just above the ankle, usually on the inside surface (lipodermatosclerosis).  Swelling. DIAGNOSIS  To diagnose this condition, your health care provider will take a medical history and do a physical exam. The following tests may be ordered to confirm the diagnosis:  Duplex ultrasound--A procedure that produces a picture of a blood vessel and nearby organs and also provides information on blood flow through the blood  vessel.  Plethysmography--A procedure that tests blood flow.  A venogram, or venography--A procedure used to look at the veins using X-ray and dye. TREATMENT The goals of treatment are to help you return to an active life and to minimize pain or disability. Treatment will depend on the severity of the condition. Medical procedures may be needed for severe cases. Treatment options may include:   Use of compression stockings. These can help with symptoms and lower the chances of the problem getting worse, but they do not cure the problem.  Sclerotherapy--A procedure involving an injection of a material that "dissolves" the damaged veins. Other veins in the network of blood vessels take over the function of the damaged veins.  Surgery to remove the vein or cut off blood flow through the vein (vein stripping or laser ablation surgery).  Surgery to repair a valve. HOME CARE INSTRUCTIONS   Wear compression stockings as directed by your health care provider.  Only take over-the-counter or prescription medicines for pain, discomfort, or fever as directed by your health care provider.  Follow up with your health care provider as directed. SEEK MEDICAL CARE IF:   You have redness, swelling, or increasing pain in the affected area.  You see a red streak or line that extends up or down from the affected area.  You have a breakdown or loss of skin in the affected area, even if the breakdown is small.  You have an injury to the affected area. SEEK IMMEDIATE MEDICAL CARE IF:   You have an injury and open wound in the affected area.  Your pain is severe and does not improve with  medicine.  You have sudden numbness or weakness in the foot or ankle below the affected area, or you have trouble moving your foot or ankle.  You have a fever or persistent symptoms for more than 2-3 days.  You have a fever and your symptoms suddenly get worse. MAKE SURE YOU:   Understand these  instructions.  Will watch your condition.  Will get help right away if you are not doing well or get worse.   This information is not intended to replace advice given to you by your health care provider. Make sure you discuss any questions you have with your health care provider.   Document Released: 12/07/2006 Document Revised: 05/24/2013 Document Reviewed: 04/10/2013 Elsevier Interactive Patient Education [employer].

## 2016-01-16 NOTE — Progress Notes (Signed)
Subjective:  Patient ID: Alyssa Luna, female    DOB: 25-Jan-1934  Age: 82 y.o. MRN: [id number]  CC: Leg Swelling and Generalized Body Aches   HPI Alyssa Luna presents for LLE mild pain and swelling x 1 month. No injury. She is not able to put on compr stockings due to her LBP  Outpatient Prescriptions Prior to Visit  Medication Sig Dispense Refill  . alum & mag hydroxide-simeth (MAALOX/MYLANTA) 200-200-20 MG/5ML suspension Take by mouth every 6 (six) hours as needed for indigestion or heartburn.    . bismuth subsalicylate (PEPTO BISMOL) 262 MG/15ML suspension Take 30 mLs by mouth as needed.    . carvedilol (COREG) 3.125 MG tablet Take 1 tablet (3.125 mg total) by mouth 2 (two) times daily. 180 tablet 3  . Cholecalciferol 1000 units tablet Take 2 tablets (2,000 Units total) by mouth daily. D-3 100 tablet 11  . colchicine 0.6 MG tablet Take 1 tablet (0.6 mg total) by mouth daily. 90 tablet 3  . Cyanocobalamin (VITAMIN B-12 IJ) Inject as directed every 30 (thirty) days.    [name] denosumab (PROLIA) 60 MG/ML SOLN injection Inject 60 mg into the skin every 6 (six) months. Administer in upper arm, thigh, or abdomen Unable to tolerate oral meds Dx severe osteoporosis 1.8 mL 6  . diazepam (VALIUM) 2 MG tablet Take 1 mg by mouth at bedtime. Reported on 12/27/2015    . flecainide (TAMBOCOR) 50 MG tablet Take 1 tablet (50 mg total) by mouth 2 (two) times daily. 180 tablet 0  . gabapentin (NEURONTIN) 100 MG capsule Take 1 capsule (100 mg total) by mouth 3 (three) times daily. 90 capsule 11  . hydrocortisone 2.5 % cream Apply 1 application topically 3 (three) times daily as needed (itching).    [name] lipase/protease/amylase (CREON) 36000 UNITS CPEP capsule Take 1 capsule (36,000 Units total) by mouth 3 (three) times daily before meals. 90 capsule 3  . losartan (COZAAR) 50 MG tablet Take 0.5 tablets (25 mg total) by mouth daily. (Patient taking differently: Take 25 mg by mouth daily as needed (BP over  150). ) 30 tablet 5  . nystatin (MYCOSTATIN) 100000 UNIT/ML suspension SWISH,HOLD AND SWALLOW ONE TEASPOONFUL FOUR TIMES A DAY 240 mL 0  . RABEprazole (ACIPHEX) 20 MG tablet Take 1 tablet (20 mg total) by mouth daily. 90 tablet 3  . Rivaroxaban (XARELTO) 15 MG TABS tablet Take 1 tablet by mouth daily.    . Simethicone (GAS-X PO) Take 1 tablet by mouth daily as needed (flatulence). Reported on 09/01/2015    . traMADol (ULTRAM) 50 MG tablet Take 0.5 tablets (25 mg total) by mouth every 6 (six) hours as needed for moderate pain or severe pain. 60 tablet 3   No facility-administered medications prior to visit.    ROS Review of Systems  Constitutional: Negative for chills, activity change, appetite change, fatigue and unexpected weight change.  HENT: Negative for congestion, mouth sores and sinus pressure.   Eyes: Negative for visual disturbance.  Respiratory: Negative for cough, chest tightness and shortness of breath.   Gastrointestinal: Negative for nausea and abdominal pain.  Genitourinary: Negative for frequency, difficulty urinating and vaginal pain.  Musculoskeletal: Positive for back pain, arthralgias and gait problem.  Skin: Positive for color change. Negative for pallor and rash.  Neurological: Negative for dizziness, tremors, weakness, numbness and headaches.  Psychiatric/Behavioral: Negative for confusion and sleep disturbance.    Objective:  BP 130/70 mmHg  Pulse 64  Temp(Src) 98.2 F (36.8 C) (  Oral)  Wt 134 lb (60.782 kg)  SpO2 97%  BP Readings from Last 3 Encounters:  01/16/16 130/70  12/27/15 128/86  11/26/15 124/70    Wt Readings from Last 3 Encounters:  01/16/16 134 lb (60.782 kg)  12/27/15 135 lb (61.236 kg)  11/26/15 132 lb (59.875 kg)    Physical Exam  Constitutional: She appears well-developed. No distress.  HENT:  Head: Normocephalic.  Right Ear: External ear normal.  Left Ear: External ear normal.  Nose: Nose normal.  Mouth/Throat: Oropharynx is  clear and moist.  Eyes: Conjunctivae are normal. Pupils are equal, round, and reactive to light. Right eye exhibits no discharge. Left eye exhibits no discharge.  Neck: Normal range of motion. Neck supple. No JVD present. No tracheal deviation present. No thyromegaly present.  Cardiovascular: Normal rate, regular rhythm and normal heart sounds.   Pulmonary/Chest: No stridor. No respiratory distress. She has no wheezes.  Abdominal: Soft. Bowel sounds are normal. She exhibits no distension and no mass. There is no tenderness. There is no rebound and no guarding.  Musculoskeletal: She exhibits edema and tenderness.  Lymphadenopathy:    She has no cervical adenopathy.  Neurological: She displays normal reflexes. No cranial nerve deficit. She exhibits normal muscle tone. Coordination normal.  Skin: No rash noted. No erythema.  Psychiatric: She has a normal mood and affect. Her behavior is normal. Judgment and thought content normal.  LLE w/some induration distally, discolored, trace edema Cane  Lab Results  Component Value Date   WBC 4.8 09/01/2015   HGB 13.1 09/01/2015   HCT 39.8 09/01/2015   PLT 222 09/01/2015   GLUCOSE 86 12/27/2015   CHOL 189 10/30/2014   TRIG 151.0* 10/30/2014   HDL 41.30 10/30/2014   LDLDIRECT 178.0 09/24/2011   LDLCALC 118* 10/30/2014   ALT 19 09/01/2015   AST 24 09/01/2015   NA 140 12/27/2015   K 4.1 12/27/2015   CL 106 12/27/2015   CREATININE 0.65 12/27/2015   BUN 13 12/27/2015   CO2 27 12/27/2015   TSH 1.991 02/01/2015   INR 1.7 10/18/2015    Ct Abdomen Pelvis W Contrast  09/02/2015  CLINICAL DATA:  Chronic left flank pain for several months. Nausea and constipation. Initial encounter. EXAM: CT ABDOMEN AND PELVIS WITH CONTRAST TECHNIQUE: Multidetector CT imaging of the abdomen and pelvis was performed using the standard protocol following bolus administration of intravenous contrast. CONTRAST:  139m OMNIPAQUE IOHEXOL 300 MG/ML  SOLN COMPARISON:  CT of  the abdomen and pelvis from 05/03/2015 FINDINGS: The visualized lung bases are clear. Bilateral breast implants are partially imaged. Small hypodensities within the liver are nonspecific, measuring up to 6 mm. A few calcified granulomata are suggested within the liver. The spleen is unremarkable in appearance. The gallbladder is within normal limits. The pancreas and adrenal glands are unremarkable. The kidneys are unremarkable in appearance. There is no evidence of hydronephrosis. No renal or ureteral stones are seen. No perinephric stranding is appreciated. No free fluid is identified. The small bowel is unremarkable in appearance. The stomach is within normal limits. No acute vascular abnormalities are seen. The appendix is normal in caliber, without evidence of appendicitis. Contrast progresses to the level of the ascending colon. Scattered diverticulosis is noted along the sigmoid colon, without evidence of diverticulitis. The bladder is mildly distended and grossly unremarkable. The patient is status post hysterectomy. No suspicious adnexal masses are seen. No inguinal lymphadenopathy is seen. No acute osseous abnormalities are identified. There is chronic compression deformity of  vertebral body L1, with associated vertebroplasty. Mild multilevel disc space narrowing and vacuum phenomenon are noted along the mid lumbar spine. IMPRESSION: 1. No acute abnormality seen to explain the patient's symptoms. 2. Scattered diverticulosis along the sigmoid colon, without evidence of diverticulitis. 3. Nonspecific small hypodensities within the liver, measuring up to 6 mm. 4. Chronic compression deformity of vertebral body L1, with changes of vertebroplasty. Mild degenerative change along the lumbar spine. Electronically Signed   By: Garald Balding M.D.   On: 09/02/2015 00:41    Assessment & Plan:   There are no diagnoses linked to this encounter. I am having Ms. Galbreath maintain her Cyanocobalamin (VITAMIN B-12  IJ), hydrocortisone, losartan, carvedilol, diazepam, alum & mag hydroxide-simeth, Simethicone (GAS-X PO), traMADol, denosumab, Cholecalciferol, bismuth subsalicylate, Rivaroxaban, gabapentin, lipase/protease/amylase, nystatin, flecainide, RABEprazole, colchicine, and famotidine.  Meds ordered this encounter  Medications  . famotidine (PEPCID) 20 MG tablet    Sig: Take 1 tablet by mouth 2 (two) times daily.    Refill:  3     Follow-up: No Follow-up on file.  Walker Kehr, MD

## 2016-01-17 ENCOUNTER — Ambulatory Visit (INDEPENDENT_AMBULATORY_CARE_PROVIDER_SITE_OTHER): Payer: Medicare Other | Admitting: Internal Medicine

## 2016-01-17 ENCOUNTER — Encounter: Payer: Self-pay | Admitting: Internal Medicine

## 2016-01-17 VITALS — BP 120/80 | HR 80 | Ht 62.0 in | Wt 134.8 lb

## 2016-01-17 DIAGNOSIS — R109 Unspecified abdominal pain: Secondary | ICD-10-CM

## 2016-01-17 DIAGNOSIS — K219 Gastro-esophageal reflux disease without esophagitis: Secondary | ICD-10-CM | POA: Diagnosis not present

## 2016-01-17 MED ORDER — RABEPRAZOLE SODIUM 20 MG PO TBEC: 20.0000 mg | DELAYED_RELEASE_TABLET | Freq: Two times a day (BID) | ORAL | Status: DC

## 2016-01-17 MED ORDER — SUCRALFATE 1 GM/10ML PO SUSP: 1.0000 g | Freq: Three times a day (TID) | ORAL | Status: DC

## 2016-01-17 NOTE — Progress Notes (Signed)
Subjective:    Patient ID: Alyssa Luna, female    DOB: 1934/07/05, 82 y.o.   MRN: [id number]  HPI Alyssa Luna is an 82 year old female with a history of GERD, hiatal hernia, esophageal dysmotility, severe diverticulosis, atrial fibrillation on Xarelto, kyphosis and scoliosis, remote breast cancer who is here for follow-up. She was last seen in February 2017. At the time of her last visit she was feeling overall well, particular from a left-sided abdominal pain standpoint and also GERD.  She reports over the last several weeks to 6 weeks she has been struggling with worsening burning in her esophagus and morning nausea. She is not having regurgitation of food or fluid in the back of her mouth, but is having heartburn. Worse with acidic foods. He's also noticed hoarseness. She's tried changing the timing of her Pepcid but despite Pepcid 20 mg twice a day symptoms have persisted. Dr. Alain Marion prescribed AcipHex but she has not tried this yet. She reports it has affected her appetite because she does not want to experience the postprandial burning chest discomfort. She denies dysphagia and odynophagia. Early morning nausea is a problem particularly after she takes her flecainide and carvedilol. She reports after taking these 2 medication she feels "terrible" for 3 hours. She is going to discuss flecainide with Dr. Leonides Schanz when she sees her next week in follow-up. Her weight has been stable in fact up 6 pounds since February.  Her left-sided abdominal pain has gradually improved. She reports it is significantly better right now. She has increased gabapentin to 4 times per day and her pain has gradually subsided. She's also started Benefiber and this is helped her tremendously. She reports a daily bowel movement which is soft and formed. No blood in her stool or melena.  She also endorses burning on her tongue which she reports her neurologist has told her this was related to her neuropathy.     Review of Systems As per history of present illness, otherwise negative   Current Medications, Allergies, Past Medical History, Past Surgical History, Family History and Social History were reviewed in [employer] record.     Objective:   Physical Exam BP 120/80 mmHg  Pulse 80  Ht 5' 2"  (1.575 m)  Wt 134 lb 12.8 oz (61.145 kg)  BMI 24.65 kg/m2 Constitutional: Well-developed and well-nourished. No distress. HEENT: Normocephalic and atraumatic. Oropharynx is clear and moist. No oropharyngeal exudate. No evidence of candida. Conjunctivae are normal.  No scleral icterus. Neck: Neck supple. Trachea midline. Cardiovascular: Normal rate, regular rhythm and intact distal pulses.  Pulmonary/chest: Effort normal and breath sounds normal. No wheezing, rales or rhonchi. Abdominal: Soft, nontender, nondistended. Bowel sounds active throughout. There are no masses palpable.  Extremities: no clubbing, cyanosis, trace pretib edema Neurological: Alert and oriented to person place and time. Skin: Skin is warm and dry.  Psychiatric: Normal mood and affect. Behavior is normal.  CBC    Component Value Date/Time   WBC 4.8 09/01/2015 1946   RBC 4.64 09/01/2015 1946   HGB 13.1 09/01/2015 1946   HCT 39.8 09/01/2015 1946   PLT 222 09/01/2015 1946   MCV 85.8 09/01/2015 1946   MCH 28.2 09/01/2015 1946   MCHC 32.9 09/01/2015 1946   RDW 13.8 09/01/2015 1946   LYMPHSABS 1.6 02/01/2015 1630   MONOABS 0.3 02/01/2015 1630   EOSABS 0.1 02/01/2015 1630   BASOSABS 0.0 02/01/2015 1630   CMP     Component Value Date/Time   NA 140  12/27/2015 1150   K 4.1 12/27/2015 1150   CL 106 12/27/2015 1150   CO2 27 12/27/2015 1150   GLUCOSE 86 12/27/2015 1150   BUN 13 12/27/2015 1150   CREATININE 0.65 12/27/2015 1150   CALCIUM 9.1 12/27/2015 1150   PROT 6.7 09/01/2015 1946   ALBUMIN 4.0 09/01/2015 1946   AST 24 09/01/2015 1946   ALT 19 09/01/2015 1946   ALKPHOS 53 09/01/2015 1946    BILITOT 0.9 09/01/2015 1946   GFRNONAA >60 09/01/2015 1946   GFRAA >60 09/01/2015 1946    CT abd/pelvis -- Jan 2017 -- reviewed    Assessment & Plan:  82 year old female with a history of GERD, hiatal hernia, esophageal dysmotility, severe diverticulosis, atrial fibrillation on Xarelto, kyphosis and scoliosis, remote breast cancer who is here for follow-up.  1. Heartburn /GERD  -- this is her biggest complaint today despite Pepcid 20 mg twice a day. She is known to have a large hiatal hernia. She also had an upper endoscopy in November 2013 which showed the hiatal hernia to be 5 cm with a Schatzki's ring. Otherwise exam was unremarkable. I recommended that she begin the AcipHex previously prescribed, but to use this 20 mg twice daily before meals. She can still use Pepcid 20 mg twice daily on an as-needed basis. I'm adding sucralfate liquid 1 g before meals and at bedtime. She can stop the sucralfate after a few weeks if symptoms improve. If symptoms fail to improve, I have recommended upper endoscopy. I asked that she call me around June 20, and if symptoms not significant early better we will proceed upper endoscopy. She voices understanding. GERD diet recommended. Remain upright after eating for at least an hour.   2. Left-sided abdominal pain  -- gradually improving, and significant early better overall. Likely multifactorial. Gabapentin seems to have helped the pain. It is also possible she was having colonic spasm in the setting of diverticulosis. In addition of Benefiber has her bowel movements regular without problems. Continue Benefiber 1 tablespoon daily.   3 month follow-up, sooner if necessary  25 minutes spent with the patient today. Greater than 50% was spent in counseling and coordination of care with the patient

## 2016-01-17 NOTE — Patient Instructions (Addendum)
We have sent the following medications to your pharmacy for you to pick up at your convenience: Aciphex 20 mg twice daily before meals Carafate 10 ml three times daily before meals and at bedtime (until burping stops).  You may purchase Pepcid 20 mg over the counter and take 1 tablet twice daily as needed.  Continue Benefiber.  If your symptoms of reflux are not significantly better by 02-04-16, let us know. We may need to schedule you for endoscopy.  Please follow up with Dr Hilarie Fredrickson Friday, 04/17/16 @ 1:30 pm.  If you are age 82 or older, your body mass index should be between 23-30. Your Body mass index is 24.65 kg/(m^2). If this is out of the aforementioned range listed, please consider follow up with your Primary Care Provider.  If you are age 25 or younger, your body mass index should be between 19-25. Your Body mass index is 24.65 kg/(m^2). If this is out of the aformentioned range listed, please consider follow up with your Primary Care Provider.

## 2016-01-21 ENCOUNTER — Ambulatory Visit (HOSPITAL_COMMUNITY)
Admission: RE | Admit: 2016-01-21 | Discharge: 2016-01-21 | Disposition: A | Discharge status: Home / Self Care | Payer: Medicare Other | Source: Ambulatory Visit | Attending: Internal Medicine | Admitting: Internal Medicine

## 2016-01-21 ENCOUNTER — Encounter: Payer: Self-pay | Admitting: Internal Medicine

## 2016-01-21 DIAGNOSIS — F419 Anxiety disorder, unspecified: Secondary | ICD-10-CM | POA: Diagnosis not present

## 2016-01-21 DIAGNOSIS — I2510 Atherosclerotic heart disease of native coronary artery without angina pectoris: Secondary | ICD-10-CM | POA: Insufficient documentation

## 2016-01-21 DIAGNOSIS — I10 Essential (primary) hypertension: Secondary | ICD-10-CM | POA: Insufficient documentation

## 2016-01-21 DIAGNOSIS — R600 Localized edema: Secondary | ICD-10-CM | POA: Insufficient documentation

## 2016-01-21 DIAGNOSIS — E785 Hyperlipidemia, unspecified: Secondary | ICD-10-CM | POA: Insufficient documentation

## 2016-01-21 DIAGNOSIS — K219 Gastro-esophageal reflux disease without esophagitis: Secondary | ICD-10-CM | POA: Diagnosis not present

## 2016-02-04 ENCOUNTER — Encounter: Payer: Self-pay | Admitting: Internal Medicine

## 2016-02-04 ENCOUNTER — Ambulatory Visit (INDEPENDENT_AMBULATORY_CARE_PROVIDER_SITE_OTHER): Payer: Medicare Other | Admitting: Internal Medicine

## 2016-02-04 VITALS — BP 118/50 | HR 73 | Wt 135.0 lb

## 2016-02-04 DIAGNOSIS — I10 Essential (primary) hypertension: Secondary | ICD-10-CM

## 2016-02-04 DIAGNOSIS — K219 Gastro-esophageal reflux disease without esophagitis: Secondary | ICD-10-CM

## 2016-02-04 DIAGNOSIS — E538 Deficiency of other specified B group vitamins: Secondary | ICD-10-CM | POA: Diagnosis not present

## 2016-02-04 DIAGNOSIS — R600 Localized edema: Secondary | ICD-10-CM

## 2016-02-04 DIAGNOSIS — G459 Transient cerebral ischemic attack, unspecified: Secondary | ICD-10-CM | POA: Diagnosis not present

## 2016-02-04 DIAGNOSIS — B351 Tinea unguium: Secondary | ICD-10-CM

## 2016-02-04 DIAGNOSIS — I480 Paroxysmal atrial fibrillation: Secondary | ICD-10-CM

## 2016-02-04 DIAGNOSIS — M810 Age-related osteoporosis without current pathological fracture: Secondary | ICD-10-CM

## 2016-02-04 DIAGNOSIS — E876 Hypokalemia: Secondary | ICD-10-CM

## 2016-02-04 MED ORDER — CYANOCOBALAMIN 1000 MCG/ML IJ SOLN
1000.0000 ug | Freq: Once | INTRAMUSCULAR | Status: AC
  Administered 2016-02-04: 1000 ug via INTRAMUSCULAR

## 2016-02-04 MED ORDER — PANTOPRAZOLE SODIUM 40 MG PO TBEC: 40.0000 mg | DELAYED_RELEASE_TABLET | Freq: Two times a day (BID) | ORAL | Status: DC

## 2016-02-04 MED ORDER — CICLOPIROX 8 % EX SOLN: Freq: Every day | CUTANEOUS | Status: DC

## 2016-02-04 NOTE — Assessment & Plan Note (Signed)
On Prolia

## 2016-02-04 NOTE — Assessment & Plan Note (Signed)
On Coreg at hs Losartan prn

## 2016-02-04 NOTE — Assessment & Plan Note (Signed)
No relapse 

## 2016-02-04 NOTE — Progress Notes (Signed)
Pre visit review using our clinic review tool, if applicable. No additional management support is needed unless otherwise documented below in the visit note. 

## 2016-02-04 NOTE — Assessment & Plan Note (Signed)
Monitor Labs

## 2016-02-04 NOTE — Progress Notes (Signed)
Subjective:  Patient ID: Alyssa Luna, female    DOB: 1933/10/03  Age: 82 y.o. MRN: [id number]  CC: No chief complaint on file.   HPI Alyssa Luna presents for leg edema, abd discomfort, HTN f/u. The pt saw a cardiologist at [place]: she is on Flecainide, however this Rx is being tapered off, GERD is worse Pt stopped Aciphex  Outpatient Prescriptions Prior to Visit  Medication Sig Dispense Refill  . alum & mag hydroxide-simeth (MAALOX/MYLANTA) 200-200-20 MG/5ML suspension Take by mouth every 6 (six) hours as needed for indigestion or heartburn.    . carvedilol (COREG) 3.125 MG tablet Take 1 tablet (3.125 mg total) by mouth 2 (two) times daily. 180 tablet 3  . Cholecalciferol 1000 units tablet Take 2 tablets (2,000 Units total) by mouth daily. D-3 100 tablet 11  . Cyanocobalamin (VITAMIN B-12 IJ) Inject as directed every 30 (thirty) days.    [name] denosumab (PROLIA) 60 MG/ML SOLN injection Inject 60 mg into the skin every 6 (six) months. Administer in upper arm, thigh, or abdomen Unable to tolerate oral meds Dx severe osteoporosis 1.8 mL 6  . Dietary Management Product (VASCULERA) TABS Take 1 tablet by mouth daily. 30 tablet 11  . famotidine (PEPCID) 20 MG tablet Take 1 tablet by mouth 2 (two) times daily.  3  . flecainide (TAMBOCOR) 50 MG tablet Take 1 tablet (50 mg total) by mouth 2 (two) times daily. 180 tablet 0  . gabapentin (NEURONTIN) 100 MG capsule Take 1 capsule (100 mg total) by mouth 3 (three) times daily. 90 capsule 11  . hydrocortisone 2.5 % cream Apply 1 application topically 3 (three) times daily as needed (itching).    [name] losartan (COZAAR) 50 MG tablet Take 0.5 tablets (25 mg total) by mouth daily. (Patient taking differently: Take 25 mg by mouth daily as needed (BP over 150). ) 30 tablet 5  . Rivaroxaban (XARELTO) 15 MG TABS tablet Take 1 tablet by mouth daily.    . Simethicone (GAS-X PO) Take 1 tablet by mouth daily as needed (flatulence). Reported on 09/01/2015    .  RABEprazole (ACIPHEX) 20 MG tablet Take 1 tablet (20 mg total) by mouth 2 (two) times daily before a meal. (Patient not taking: Reported on 02/04/2016) 60 tablet 3  . sucralfate (CARAFATE) 1 GM/10ML suspension Take 10 mLs (1 g total) by mouth 4 (four) times daily -  before meals and at bedtime. (Patient not taking: Reported on 02/04/2016) 1200 mL 1   No facility-administered medications prior to visit.    ROS Review of Systems  Constitutional: Positive for fatigue. Negative for chills, activity change, appetite change and unexpected weight change.  HENT: Negative for congestion, mouth sores and sinus pressure.   Eyes: Negative for visual disturbance.  Respiratory: Negative for cough and chest tightness.   Cardiovascular: Positive for leg swelling. Negative for chest pain.  Gastrointestinal: Negative for nausea and abdominal pain.  Genitourinary: Negative for frequency, difficulty urinating and vaginal pain.  Musculoskeletal: Negative for back pain and gait problem.  Skin: Negative for pallor and rash.  Neurological: Positive for weakness. Negative for dizziness, tremors, numbness and headaches.  Psychiatric/Behavioral: Negative for confusion, sleep disturbance and dysphoric mood. The patient is nervous/anxious.     Objective:  BP 118/50 mmHg  Pulse 73  Wt 135 lb (61.236 kg)  SpO2 95%  BP Readings from Last 3 Encounters:  02/04/16 118/50  01/17/16 120/80  01/16/16 130/70    Wt Readings from Last 3 Encounters:  02/04/16 135 lb (61.236 kg)  01/17/16 134 lb 12.8 oz (61.145 kg)  01/16/16 134 lb (60.782 kg)    Physical Exam  Constitutional: She appears well-developed. No distress.  HENT:  Head: Normocephalic.  Right Ear: External ear normal.  Left Ear: External ear normal.  Nose: Nose normal.  Mouth/Throat: Oropharynx is clear and moist.  Eyes: Conjunctivae are normal. Pupils are equal, round, and reactive to light. Right eye exhibits no discharge. Left eye exhibits no  discharge.  Neck: Normal range of motion. Neck supple. No JVD present. No tracheal deviation present. No thyromegaly present.  Cardiovascular: Normal rate, regular rhythm and normal heart sounds.   Pulmonary/Chest: No stridor. No respiratory distress. She has no wheezes.  Abdominal: Soft. Bowel sounds are normal. She exhibits no distension and no mass. There is no tenderness. There is no rebound and no guarding.  Musculoskeletal: She exhibits edema. She exhibits no tenderness.  Lymphadenopathy:    She has no cervical adenopathy.  Neurological: She displays normal reflexes. No cranial nerve deficit. She exhibits normal muscle tone. Coordination normal.  Skin: No rash noted. No erythema.  Psychiatric: She has a normal mood and affect. Her behavior is normal. Judgment and thought content normal.  LEs w/trace edema  Lab Results  Component Value Date   WBC 4.8 09/01/2015   HGB 13.1 09/01/2015   HCT 39.8 09/01/2015   PLT 222 09/01/2015   GLUCOSE 86 12/27/2015   CHOL 189 10/30/2014   TRIG 151.0* 10/30/2014   HDL 41.30 10/30/2014   LDLDIRECT 178.0 09/24/2011   LDLCALC 118* 10/30/2014   ALT 19 09/01/2015   AST 24 09/01/2015   NA 140 12/27/2015   K 4.1 12/27/2015   CL 106 12/27/2015   CREATININE 0.65 12/27/2015   BUN 13 12/27/2015   CO2 27 12/27/2015   TSH 1.991 02/01/2015   INR 1.7 10/18/2015    No results found.  Assessment & Plan:   Diagnoses and all orders for this visit:  Paroxysmal atrial fibrillation ([place])  Essential hypertension  Transient cerebral ischemia, unspecified transient cerebral ischemia type  Localized edema  Hypokalemia  B12 deficiency  I am having [name] maintain her Cyanocobalamin (VITAMIN B-12 IJ), hydrocortisone, losartan, carvedilol, alum & mag hydroxide-simeth, Simethicone (GAS-X PO), denosumab, Cholecalciferol, Rivaroxaban, gabapentin, flecainide, famotidine, VASCULERA, RABEprazole, and sucralfate.  No orders of the defined types were  placed in this encounter.     Follow-up: Return in about 2 months (around 04/05/2016) for a follow-up visit.  Walker Kehr, MD

## 2016-02-04 NOTE — Assessment & Plan Note (Signed)
On Coreg, Xarelto She is on Flecainide, however this Rx is being tapered off

## 2016-02-04 NOTE — Assessment & Plan Note (Signed)
Ven doppler (-) B CT abd (-) 1/17 Chronic  Compression socks, support Could be aggravated by Flecanide Venous insufficiency

## 2016-02-04 NOTE — Assessment & Plan Note (Signed)
On Pepcid - bid

## 2016-02-04 NOTE — Assessment & Plan Note (Signed)
[place]

## 2016-02-04 NOTE — Assessment & Plan Note (Signed)
B12 inj

## 2016-02-05 ENCOUNTER — Telehealth: Payer: Self-pay | Admitting: *Deleted

## 2016-02-05 MED ORDER — VASCULERA PO TABS: 1.0000 | ORAL_TABLET | Freq: Every day | ORAL | Status: DC

## 2016-02-05 NOTE — Telephone Encounter (Signed)
Notified pt rx fax to # given...Alyssa Luna

## 2016-02-05 NOTE — Telephone Encounter (Signed)
Received call pt states MD rx Vasclera she wuill be getting this medicvation fromt he company. They are needing rx fax to them @ [phone]...[name]

## 2016-02-07 ENCOUNTER — Other Ambulatory Visit: Payer: Self-pay | Admitting: Internal Medicine

## 2016-03-02 ENCOUNTER — Ambulatory Visit: Payer: Medicare Other | Admitting: Internal Medicine

## 2016-03-17 ENCOUNTER — Ambulatory Visit (INDEPENDENT_AMBULATORY_CARE_PROVIDER_SITE_OTHER): Payer: Medicare Other | Admitting: *Deleted

## 2016-03-17 ENCOUNTER — Telehealth: Payer: Self-pay | Admitting: *Deleted

## 2016-03-17 DIAGNOSIS — E538 Deficiency of other specified B group vitamins: Secondary | ICD-10-CM

## 2016-03-17 MED ORDER — CYANOCOBALAMIN 1000 MCG/ML IJ SOLN
1000.0000 ug | Freq: Once | INTRAMUSCULAR | Status: AC
  Administered 2016-03-17: 1000 ug via INTRAMUSCULAR

## 2016-03-17 NOTE — Telephone Encounter (Signed)
Note placed in prolia book on patients chart to schedule 2nd week in October after insurance is verified

## 2016-03-17 NOTE — Telephone Encounter (Signed)
Pt came in today to get her B12 inj wanting to let md/nurse know that she will be due for her Prolia inj Sept 29, but she is going on vacation the 1st week in Oct so she want to get her prolia the sec week in Oct. Inform pt will send msg to our team leader who does the prolia, and once she check insurance she will give her a call to set up appt...[name]

## 2016-03-27 ENCOUNTER — Ambulatory Visit (INDEPENDENT_AMBULATORY_CARE_PROVIDER_SITE_OTHER): Payer: Medicare Other | Admitting: Internal Medicine

## 2016-03-27 ENCOUNTER — Encounter: Payer: Self-pay | Admitting: Internal Medicine

## 2016-03-27 DIAGNOSIS — K146 Glossodynia: Secondary | ICD-10-CM | POA: Diagnosis not present

## 2016-03-27 DIAGNOSIS — M5416 Radiculopathy, lumbar region: Secondary | ICD-10-CM | POA: Diagnosis not present

## 2016-03-27 DIAGNOSIS — S32040S Wedge compression fracture of fourth lumbar vertebra, sequela: Secondary | ICD-10-CM

## 2016-03-27 DIAGNOSIS — R5382 Chronic fatigue, unspecified: Secondary | ICD-10-CM | POA: Diagnosis not present

## 2016-03-27 DIAGNOSIS — R600 Localized edema: Secondary | ICD-10-CM

## 2016-03-27 MED ORDER — GABAPENTIN 100 MG PO CAPS: 100.0000 mg | ORAL_CAPSULE | Freq: Four times a day (QID) | ORAL | 5 refills | Status: DC

## 2016-03-27 NOTE — Assessment & Plan Note (Signed)
On steroid shots - Dr Owens Shark Gabapentin

## 2016-03-27 NOTE — Assessment & Plan Note (Signed)
On Prolia Due in Oct

## 2016-03-27 NOTE — Progress Notes (Signed)
Pre visit review using our clinic review tool, if applicable. No additional management support is needed unless otherwise documented below in the visit note. 

## 2016-03-27 NOTE — Assessment & Plan Note (Signed)
Doing fair

## 2016-03-27 NOTE — Patient Instructions (Signed)
GERD foam wedge: ContourSleep Side Sleeper Bed Wedge

## 2016-03-27 NOTE — Progress Notes (Signed)
Subjective:  Patient ID: Alyssa Luna, female    DOB: 04-02-1934  Age: 82 y.o. MRN: [id number]  CC: Heartburn and Numbness (Left leg. Edema has resolved. )   HPI Alyssa Luna presents for GERD - not better, leg swelling - better on Vasculera; knee pain; osteoporosis. She had 2 steroid injections  Outpatient Medications Prior to Visit  Medication Sig Dispense Refill  . alum & mag hydroxide-simeth (MAALOX/MYLANTA) 200-200-20 MG/5ML suspension Take by mouth every 6 (six) hours as needed for indigestion or heartburn.    . carvedilol (COREG) 3.125 MG tablet Take 1 tablet (3.125 mg total) by mouth 2 (two) times daily. 180 tablet 3  . Cholecalciferol 1000 units tablet Take 2 tablets (2,000 Units total) by mouth daily. D-3 100 tablet 11  . ciclopirox (PENLAC) 8 % solution Apply topically at bedtime. Apply over nail and surrounding skin. Apply daily over previous coat. After seven (7) days, may remove with alcohol and continue cycle. 6.6 mL 0  . Cyanocobalamin (VITAMIN B-12 IJ) Inject as directed every 30 (thirty) days.    [name] denosumab (PROLIA) 60 MG/ML SOLN injection Inject 60 mg into the skin every 6 (six) months. Administer in upper arm, thigh, or abdomen Unable to tolerate oral meds Dx severe osteoporosis 1.8 mL 6  . Dietary Management Product (VASCULERA) TABS Take 1 tablet by mouth daily. 30 tablet 11  . famotidine (PEPCID) 20 MG tablet Take 1 tablet by mouth 2 (two) times daily.  3  . flecainide (TAMBOCOR) 50 MG tablet Take 1 and 1/2 tablets by  mouth two times daily 270 tablet 0  . gabapentin (NEURONTIN) 100 MG capsule Take 1 capsule (100 mg total) by mouth 3 (three) times daily. 90 capsule 11  . hydrocortisone 2.5 % cream Apply 1 application topically 3 (three) times daily as needed (itching).    [name] losartan (COZAAR) 50 MG tablet Take 0.5 tablets (25 mg total) by mouth daily. (Patient taking differently: Take 25 mg by mouth daily as needed (BP over 150). ) 30 tablet 5  .  pantoprazole (PROTONIX) 40 MG tablet Take 1 tablet (40 mg total) by mouth 2 (two) times daily. 60 tablet 5  . Rivaroxaban (XARELTO) 15 MG TABS tablet Take 1 tablet by mouth daily.    . Simethicone (GAS-X PO) Take 1 tablet by mouth daily as needed (flatulence). Reported on 09/01/2015    . sucralfate (CARAFATE) 1 GM/10ML suspension Take 10 mLs (1 g total) by mouth 4 (four) times daily -  before meals and at bedtime. (Patient not taking: Reported on 03/27/2016) 1200 mL 1   No facility-administered medications prior to visit.     ROS Review of Systems  Constitutional: Positive for fatigue. Negative for activity change, appetite change, chills and unexpected weight change.  HENT: Negative for congestion, mouth sores and sinus pressure.   Eyes: Negative for visual disturbance.  Respiratory: Negative for cough and chest tightness.   Gastrointestinal: Negative for abdominal pain and nausea.  Genitourinary: Negative for difficulty urinating, frequency and vaginal pain.  Musculoskeletal: Positive for arthralgias and back pain. Negative for gait problem.  Skin: Negative for pallor and rash.  Neurological: Positive for weakness. Negative for dizziness, tremors, numbness and headaches.  Psychiatric/Behavioral: Positive for sleep disturbance. Negative for confusion.    Objective:  BP 130/74   Pulse (!) 58   Wt 132 lb (59.9 kg)   SpO2 96%   BMI 24.14 kg/m   BP Readings from Last 3 Encounters:  03/27/16 130/74  02/04/16 (!) 118/50  01/17/16 120/80    Wt Readings from Last 3 Encounters:  03/27/16 132 lb (59.9 kg)  02/04/16 135 lb (61.2 kg)  01/17/16 134 lb 12.8 oz (61.1 kg)    Physical Exam  Constitutional: She appears well-developed. No distress.  HENT:  Head: Normocephalic.  Right Ear: External ear normal.  Left Ear: External ear normal.  Nose: Nose normal.  Mouth/Throat: Oropharynx is clear and moist.  Eyes: Conjunctivae are normal. Pupils are equal, round, and reactive to light.  Right eye exhibits no discharge. Left eye exhibits no discharge.  Neck: Normal range of motion. Neck supple. No JVD present. No tracheal deviation present. No thyromegaly present.  Cardiovascular: Normal rate, regular rhythm and normal heart sounds.   Pulmonary/Chest: No stridor. No respiratory distress. She has no wheezes.  Abdominal: Soft. Bowel sounds are normal. She exhibits no distension and no mass. There is no tenderness. There is no rebound and no guarding.  Musculoskeletal: She exhibits tenderness. She exhibits no edema.  Lymphadenopathy:    She has no cervical adenopathy.  Neurological: She displays normal reflexes. No cranial nerve deficit. She exhibits normal muscle tone. Coordination normal.  Skin: No rash noted. No erythema.  Psychiatric: She has a normal mood and affect. Her behavior is normal. Judgment and thought content normal.  Thor kyphosis  Lab Results  Component Value Date   WBC 4.8 09/01/2015   HGB 13.1 09/01/2015   HCT 39.8 09/01/2015   PLT 222 09/01/2015   GLUCOSE 86 12/27/2015   CHOL 189 10/30/2014   TRIG 151.0 (H) 10/30/2014   HDL 41.30 10/30/2014   LDLDIRECT 178.0 09/24/2011   LDLCALC 118 (H) 10/30/2014   ALT 19 09/01/2015   AST 24 09/01/2015   NA 140 12/27/2015   K 4.1 12/27/2015   CL 106 12/27/2015   CREATININE 0.65 12/27/2015   BUN 13 12/27/2015   CO2 27 12/27/2015   TSH 1.991 02/01/2015   INR 1.7 10/18/2015    No results found.  Assessment & Plan:   There are no diagnoses linked to this encounter. I am having Ms. Punch maintain her Cyanocobalamin (VITAMIN B-12 IJ), hydrocortisone, losartan, carvedilol, alum & mag hydroxide-simeth, Simethicone (GAS-X PO), denosumab, Cholecalciferol, Rivaroxaban, gabapentin, famotidine, sucralfate, ciclopirox, pantoprazole, VASCULERA, and flecainide.  No orders of the defined types were placed in this encounter.    Follow-up: No Follow-up on file.  Walker Kehr, MD

## 2016-03-27 NOTE — Assessment & Plan Note (Signed)
Better on [employer]

## 2016-03-27 NOTE — Assessment & Plan Note (Addendum)
2016 Worse Increase - Gabapentin prn Mirapex option was discussed

## 2016-04-10 ENCOUNTER — Telehealth: Payer: Self-pay | Admitting: *Deleted

## 2016-04-10 MED ORDER — GABAPENTIN 100 MG PO CAPS: 100.0000 mg | ORAL_CAPSULE | Freq: Four times a day (QID) | ORAL | 5 refills | Status: DC

## 2016-04-10 NOTE — Telephone Encounter (Signed)
Left msg on triage stating she tried to get refill on her Gabapentin but [facility] told her it was too soon. She states she takes med four times a day. The rx that [facility] has is not correct they have three times a day. Requsting updated script to be sent to [facility]. MD sent rx on 8/11 [facility] to [facility].../lmb

## 2016-04-14 ENCOUNTER — Ambulatory Visit: Payer: Medicare Other | Admitting: Internal Medicine

## 2016-04-17 ENCOUNTER — Ambulatory Visit (INDEPENDENT_AMBULATORY_CARE_PROVIDER_SITE_OTHER): Payer: Medicare Other | Admitting: Internal Medicine

## 2016-04-17 ENCOUNTER — Encounter: Payer: Self-pay | Admitting: Internal Medicine

## 2016-04-17 VITALS — BP 118/66 | HR 74 | Ht 64.0 in | Wt 135.0 lb

## 2016-04-17 DIAGNOSIS — K219 Gastro-esophageal reflux disease without esophagitis: Secondary | ICD-10-CM

## 2016-04-17 NOTE — Patient Instructions (Addendum)
Please continue Pepcid 20 mg twice daily.  Continue Benefiber daily.  Follow a GERD diet/lifestyle (see info below).  Follow up in 1 year, sooner if necessary.  If you are age 80 or older, your body mass index should be between 23-30. Your Body mass index is 23.17 kg/m. If this is out of the aforementioned range listed, please consider follow up with your Primary Care Provider.  If you are age 11 or younger, your body mass index should be between 19-25. Your Body mass index is 23.17 kg/m. If this is out of the aformentioned range listed, please consider follow up with your Primary Care Provider.   Food Choices for Gastroesophageal Reflux Disease, Adult When you have gastroesophageal reflux disease (GERD), the foods you eat and your eating habits are very important. Choosing the right foods can help ease the discomfort of GERD. WHAT GENERAL GUIDELINES DO I NEED TO FOLLOW?  Choose fruits, vegetables, whole grains, low-fat dairy products, and low-fat meat, fish, and poultry.  Limit fats such as oils, salad dressings, butter, nuts, and avocado.  Keep a food diary to identify foods that cause symptoms.  Avoid foods that cause reflux. These may be different for different people.  Eat frequent small meals instead of three large meals each day.  Eat your meals slowly, in a relaxed setting.  Limit fried foods.  Cook foods using methods other than frying.  Avoid drinking alcohol.  Avoid drinking large amounts of liquids with your meals.  Avoid bending over or lying down until 2-3 hours after eating. WHAT FOODS ARE NOT RECOMMENDED? The following are some foods and drinks that may worsen your symptoms: Vegetables Tomatoes. Tomato juice. Tomato and spaghetti sauce. Chili peppers. Onion and garlic. Horseradish. Fruits Oranges, grapefruit, and lemon (fruit and juice). Meats High-fat meats, fish, and poultry. This includes hot dogs, ribs, ham, sausage, salami, and bacon. Dairy Whole  milk and chocolate milk. Sour cream. Cream. Butter. Ice cream. Cream cheese.  Beverages Coffee and tea, with or without caffeine. Carbonated beverages or energy drinks. Condiments Hot sauce. Barbecue sauce.  Sweets/Desserts Chocolate and cocoa. Donuts. Peppermint and spearmint. Fats and Oils High-fat foods, including [place] fries and potato chips. Other Vinegar. Strong spices, such as black pepper, white pepper, red pepper, cayenne, curry powder, cloves, ginger, and chili powder. The items listed above may not be a complete list of foods and beverages to avoid. Contact your dietitian for more information.   This information is not intended to replace advice given to you by your health care provider. Make sure you discuss any questions you have with your health care provider.   Document Released: 08/03/2005 Document Revised: 08/24/2014 Document Reviewed: 06/07/2013 Elsevier Interactive Patient Education [employer].

## 2016-04-17 NOTE — Progress Notes (Signed)
Subjective:    Patient ID: Alyssa Luna, female    DOB: 1933-09-19, 82 y.o.   MRN: [id number]  HPI Alyssa Luna is an 82 year old female with history of GERD, hiatal hernia, esophageal dysmotility, severe diverticulosis, A. fib on Xarelto who is here for follow-up. She was last seen on 01/17/2016 to evaluate heartburn. We prescribed AcipHex which she felt may have been "making me worse". She stuck with the Pepcid 20 mg twice daily and she reports she is considerably better. Her heartburn, regurgitation, belching and burping have resolved. She is able to eat more fruits and vegetables. She will occasionally have mild heartburn but this is considerably better. When she takes her flecainide and carvedilol each morning she has about 2 hours of feeling poorly. She denies chest pain and shortness of breath. Her left-sided abdominal pain has resolved. She is using Benefiber daily and bowel movements have been regular.  Review of Systems As per history of present illness, otherwise negative  Current Medications, Allergies, Past Medical History, Past Surgical History, Family History and Social History were reviewed in [employer] record.     Objective:   Physical Exam BP 118/66   Pulse 74   Ht 5' 4"  (1.626 m)   Wt 135 lb (61.2 kg)   BMI 23.17 kg/m  Constitutional: Well-developed and well-nourished Elderly female in No distress. HEENT: Normocephalic and atraumatic. Oropharynx is clear and moist. No oropharyngeal exudate. Conjunctivae are normal.  No scleral icterus. Neck: Neck supple. Trachea midline. Cardiovascular: Normal rate, regular rhythm and intact distal pulses. No M/R/G Pulmonary/chest: Effort normal and breath sounds normal. No wheezing, rales or rhonchi. Abdominal: Soft, nontender, nondistended. Bowel sounds active throughout.  Extremities: no clubbing, cyanosis, or edema Neurological: Alert and oriented to person place and time. Skin: Skin is warm and  dry.  Psychiatric: Normal mood and affect. Behavior is normal.  CBC    Component Value Date/Time   WBC 4.8 09/01/2015 1946   RBC 4.64 09/01/2015 1946   HGB 13.1 09/01/2015 1946   HCT 39.8 09/01/2015 1946   PLT 222 09/01/2015 1946   MCV 85.8 09/01/2015 1946   MCH 28.2 09/01/2015 1946   MCHC 32.9 09/01/2015 1946   RDW 13.8 09/01/2015 1946   LYMPHSABS 1.6 02/01/2015 1630   MONOABS 0.3 02/01/2015 1630   EOSABS 0.1 02/01/2015 1630   BASOSABS 0.0 02/01/2015 1630   CMP     Component Value Date/Time   NA 140 12/27/2015 1150   K 4.1 12/27/2015 1150   CL 106 12/27/2015 1150   CO2 27 12/27/2015 1150   GLUCOSE 86 12/27/2015 1150   BUN 13 12/27/2015 1150   CREATININE 0.65 12/27/2015 1150   CALCIUM 9.1 12/27/2015 1150   PROT 6.7 09/01/2015 1946   ALBUMIN 4.0 09/01/2015 1946   AST 24 09/01/2015 1946   ALT 19 09/01/2015 1946   ALKPHOS 53 09/01/2015 1946   BILITOT 0.9 09/01/2015 1946   GFRNONAA >60 09/01/2015 1946   GFRAA >60 09/01/2015 1946      Assessment & Plan:  82 year old female with history of GERD, hiatal hernia, esophageal dysmotility, severe diverticulosis, A. fib on Xarelto who is here for follow-up  1. GERD -- She has improved on famotidine 20 mg twice daily. I have recommended she continue this dose. Her 2 hours of feeling poorly in the morning are felt most likely related to flecainide rather than carvedilol or AcipHex/other PPI. For now though given improvement in symptoms have recommended she continue GERD diet and  twice a day famotidine. She is advised to eat small more frequent meals given her reflux, large hiatal hernia. Remain upright after eating. She is already sleeping on a wedge at night which helps.  2. Left-sided abdominal pain -- completely resolved. No further workup at present  25 minutes spent with the patient today. Greater than 50% was spent in counseling and coordination of care with the patient

## 2016-04-21 ENCOUNTER — Telehealth: Payer: Self-pay

## 2016-04-21 NOTE — Telephone Encounter (Signed)
Patient due prolia injection on or after 05/15/16---I have sumitted for insurance verification---waiting for summary of benefits

## 2016-04-27 ENCOUNTER — Telehealth: Payer: Self-pay | Admitting: Internal Medicine

## 2016-04-27 NOTE — Telephone Encounter (Signed)
[facility] - Client [facility]  Patient Name: Alyssa Luna  DOB: 11-03-1933    Initial Comment Caller states she had a Tick Bite, she was told she may have [facility] [place] Fever by the tests done at the ER. Body aches, and flu like symptoms.   Nurse Assessment  Nurse: Wynetta Emery, RN, Baker Janus Date/Time Eilene Ghazi Time): 04/27/2016 8:54:33 AM  Confirm and document reason for call. If symptomatic, describe symptoms. You must click the next button to save text entered. ---Johnney Ou was seen in ED on Monday and face numb tests done and went home went back next day to do more tests to rule out stroke; blood work showed she was negative for [employer] and has s/sx of [facility] Fever f/u with MD  Has the patient traveled out of the country within the last 30 days? ---No  Does the patient have any new or worsening symptoms? ---Yes  Will a triage be completed? ---Yes  Related visit to physician within the last 2 weeks? ---Yes  Does the PT have any chronic conditions? (i.e. diabetes, asthma, etc.) ---Unknown  Is this a behavioral health or substance abuse call? ---No     Guidelines    Guideline Title Affirmed Question Affirmed Notes  Tick Bite [1] 2 to 14 days following tick bite AND [2] fever AND [3] no rash or headache    Final Disposition User   See Physician within [place], RN, Baker Janus    Comments  nOTE no availability to get in to see MD today or tomorrow; wants to be placed on cancellation list if we have one; has a scheduled appt on Wednesday advised if she gets worse call back or go to ED.   Referrals  REFERRED TO PCP OFFICE   Disagree/Comply: Comply

## 2016-04-29 ENCOUNTER — Ambulatory Visit (INDEPENDENT_AMBULATORY_CARE_PROVIDER_SITE_OTHER): Payer: Medicare Other | Admitting: Internal Medicine

## 2016-04-29 ENCOUNTER — Encounter: Payer: Self-pay | Admitting: Internal Medicine

## 2016-04-29 ENCOUNTER — Other Ambulatory Visit (INDEPENDENT_AMBULATORY_CARE_PROVIDER_SITE_OTHER): Payer: Medicare Other

## 2016-04-29 DIAGNOSIS — E538 Deficiency of other specified B group vitamins: Secondary | ICD-10-CM

## 2016-04-29 DIAGNOSIS — R5382 Chronic fatigue, unspecified: Secondary | ICD-10-CM

## 2016-04-29 DIAGNOSIS — A770 Spotted fever due to Rickettsia rickettsii: Secondary | ICD-10-CM | POA: Diagnosis not present

## 2016-04-29 LAB — BASIC METABOLIC PANEL
BUN: 13 mg/dL (ref 6–23)
CALCIUM: 9.1 mg/dL (ref 8.4–10.5)
CHLORIDE: 104 meq/L (ref 96–112)
CO2: 31 meq/L (ref 19–32)
CREATININE: 0.69 mg/dL (ref 0.40–1.20)
GFR: 86.87 mL/min (ref >60.00)
GLUCOSE: 80 mg/dL (ref 70–99)
Potassium: 4.2 mEq/L (ref 3.5–5.1)
Sodium: 138 mEq/L (ref 135–145)

## 2016-04-29 LAB — URINALYSIS, ROUTINE W REFLEX MICROSCOPIC
Bilirubin Urine: NEGATIVE
KETONES UR: NEGATIVE
Nitrite: NEGATIVE
SPECIFIC GRAVITY, URINE: 1.02 (ref 1.000–1.030)
Total Protein, Urine: NEGATIVE
Urine Glucose: NEGATIVE
Urobilinogen, UA: 0.2 (ref 0.0–1.0)
pH: 6 (ref 5.0–8.0)

## 2016-04-29 LAB — CBC WITH DIFFERENTIAL/PLATELET
BASOS ABS: 0 10*3/uL (ref 0.0–0.1)
Basophils Relative: 0.8 % (ref 0.0–3.0)
EOS ABS: 0.2 10*3/uL (ref 0.0–0.7)
EOS PCT: 3.1 % (ref 0.0–5.0)
HCT: 39.2 % (ref 36.0–46.0)
HEMOGLOBIN: 13.2 g/dL (ref 12.0–15.0)
LYMPHS ABS: 1.7 10*3/uL (ref 0.7–4.0)
Lymphocytes Relative: 34 % (ref 12.0–46.0)
MCHC: 33.7 g/dL (ref 30.0–36.0)
MCV: 85.1 fl (ref 78.0–100.0)
MONO ABS: 0.6 10*3/uL (ref 0.1–1.0)
Monocytes Relative: 11.9 % (ref 3.0–12.0)
NEUTROS PCT: 50.2 % (ref 43.0–77.0)
Neutro Abs: 2.5 10*3/uL (ref 1.4–7.7)
Platelets: 244 10*3/uL (ref 150.0–400.0)
RBC: 4.61 Mil/uL (ref 3.87–5.11)
RDW: 14.2 % (ref 11.5–15.5)
WBC: 5 10*3/uL (ref 4.0–10.5)

## 2016-04-29 LAB — HEPATIC FUNCTION PANEL
ALBUMIN: 4 g/dL (ref 3.5–5.2)
ALK PHOS: 56 U/L (ref 39–117)
ALT: 16 U/L (ref 0–35)
AST: 20 U/L (ref 0–37)
BILIRUBIN DIRECT: 0.1 mg/dL (ref 0.0–0.3)
BILIRUBIN TOTAL: 0.5 mg/dL (ref 0.2–1.2)
Total Protein: 6.8 g/dL (ref 6.0–8.3)

## 2016-04-29 LAB — SEDIMENTATION RATE: SED RATE: 8 mm/h (ref 0–30)

## 2016-04-29 MED ORDER — DOXYCYCLINE HYCLATE 100 MG PO TABS: 100.0000 mg | ORAL_TABLET | Freq: Two times a day (BID) | ORAL | 0 refills | Status: DC

## 2016-04-29 MED ORDER — CYANOCOBALAMIN 1000 MCG/ML IJ SOLN
1000.0000 ug | Freq: Once | INTRAMUSCULAR | Status: AC
  Administered 2016-04-29: 1000 ug via INTRAMUSCULAR

## 2016-04-29 MED ORDER — DOXYCYCLINE HYCLATE 100 MG PO TABS
100.0000 mg | ORAL_TABLET | Freq: Two times a day (BID) | ORAL | 0 refills | Status: DC
Start: 2016-04-29 — End: 2016-04-29

## 2016-04-29 NOTE — Progress Notes (Signed)
Pre visit review using our clinic review tool, if applicable. No additional management support is needed unless otherwise documented below in the visit note. 

## 2016-04-29 NOTE — Assessment & Plan Note (Signed)
ER visit for face numbness and leg weakness: RMSF IFA was 1:320; Lyme serology was (-). No abx was given. Labs today

## 2016-04-29 NOTE — Assessment & Plan Note (Signed)
Possible ER visit for face numbness and leg weakness: RMSF IFA was 1:320; Lyme serology was (-). No abx was given. Doxy x 2 weeks Repeat labs

## 2016-04-29 NOTE — Progress Notes (Signed)
Subjective:  Patient ID: Alyssa Luna, female    DOB: 1934/04/17  Age: 82 y.o. MRN: [id number]  CC: Tick Removal (8/18 or 8/19 See 04/21/16 Care Everywhere encounters from [facility]); Extremity Weakness; and Dizziness   HPI Alyssa Luna presents for a possible RMSF exposure from ticks (819/17). 04/20/16 ER visit for face numbness and leg weakness: RMSF IFA was 1:320; Lyme serology was (-). No abx was given.  Outpatient Medications Prior to Visit  Medication Sig Dispense Refill  . alum & mag hydroxide-simeth (MAALOX/MYLANTA) 200-200-20 MG/5ML suspension Take by mouth every 6 (six) hours as needed for indigestion or heartburn.    . carvedilol (COREG) 3.125 MG tablet Take 1 tablet (3.125 mg total) by mouth 2 (two) times daily. 180 tablet 3  . Cholecalciferol 1000 units tablet Take 2 tablets (2,000 Units total) by mouth daily. D-3 100 tablet 11  . Cyanocobalamin (VITAMIN B-12 IJ) Inject as directed every 30 (thirty) days.    [name] denosumab (PROLIA) 60 MG/ML SOLN injection Inject 60 mg into the skin every 6 (six) months. Administer in upper arm, thigh, or abdomen Unable to tolerate oral meds Dx severe osteoporosis 1.8 mL 6  . famotidine (PEPCID) 20 MG tablet Take 1 tablet by mouth 2 (two) times daily.  3  . flecainide (TAMBOCOR) 50 MG tablet Take 1 and 1/2 tablets by  mouth two times daily 270 tablet 0  . gabapentin (NEURONTIN) 100 MG capsule Take 1-2 capsules (100-200 mg total) by mouth 4 (four) times daily. 240 capsule 5  . hydrocortisone 2.5 % cream Apply 1 application topically 3 (three) times daily as needed (itching).    [name] losartan (COZAAR) 50 MG tablet Take 0.5 tablets (25 mg total) by mouth daily. (Patient taking differently: Take 25 mg by mouth daily as needed (BP over 150). ) 30 tablet 5  . Rivaroxaban (XARELTO) 15 MG TABS tablet Take 1 tablet by mouth daily.    . Simethicone (GAS-X PO) Take 1 tablet by mouth daily as needed (flatulence). Reported on 09/01/2015    . ciclopirox (PENLAC) 8  % solution Apply topically at bedtime. Apply over nail and surrounding skin. Apply daily over previous coat. After seven (7) days, may remove with alcohol and continue cycle. (Patient not taking: Reported on 04/29/2016) 6.6 mL 0  . Dietary Management Product (VASCULERA) TABS Take 1 tablet by mouth daily. (Patient not taking: Reported on 04/29/2016) 30 tablet 11   No facility-administered medications prior to visit.     ROS Review of Systems  Constitutional: Positive for fatigue. Negative for activity change, appetite change, chills and unexpected weight change.  HENT: Negative for congestion, mouth sores and sinus pressure.   Eyes: Negative for visual disturbance.  Respiratory: Negative for cough and chest tightness.   Gastrointestinal: Negative for abdominal pain and nausea.  Genitourinary: Negative for difficulty urinating, frequency and vaginal pain.  Musculoskeletal: Positive for arthralgias and myalgias. Negative for back pain and gait problem.  Skin: Negative for pallor and rash.  Neurological: Positive for weakness. Negative for dizziness, tremors, numbness and headaches.  Psychiatric/Behavioral: Negative for confusion and sleep disturbance.    Objective:  BP 120/78   Pulse 72   Temp 98.2 F (36.8 C) (Oral)   Wt 133 lb (60.3 kg)   SpO2 96%   BMI 22.83 kg/m   BP Readings from Last 3 Encounters:  04/29/16 120/78  04/17/16 118/66  03/27/16 130/74    Wt Readings from Last 3 Encounters:  04/29/16 133 lb (60.3 kg)  04/17/16 135 lb (61.2 kg)  03/27/16 132 lb (59.9 kg)    Physical Exam  Constitutional: She appears well-developed. No distress.  HENT:  Head: Normocephalic.  Right Ear: External ear normal.  Left Ear: External ear normal.  Nose: Nose normal.  Mouth/Throat: Oropharynx is clear and moist.  Eyes: Conjunctivae are normal. Pupils are equal, round, and reactive to light. Right eye exhibits no discharge. Left eye exhibits no discharge.  Neck: Normal range of  motion. Neck supple. No JVD present. No tracheal deviation present. No thyromegaly present.  Cardiovascular: Normal rate, regular rhythm and normal heart sounds.   Pulmonary/Chest: No stridor. No respiratory distress. She has no wheezes.  Abdominal: Soft. Bowel sounds are normal. She exhibits no distension and no mass. There is no tenderness. There is no rebound and no guarding.  Musculoskeletal: She exhibits tenderness. She exhibits no edema.  Lymphadenopathy:    She has no cervical adenopathy.  Neurological: She displays normal reflexes. No cranial nerve deficit. She exhibits normal muscle tone. Coordination abnormal.  Skin: No rash noted. No erythema.  Psychiatric: She has a normal mood and affect. Her behavior is normal. Judgment and thought content normal.    Lab Results  Component Value Date   WBC 4.8 09/01/2015   HGB 13.1 09/01/2015   HCT 39.8 09/01/2015   PLT 222 09/01/2015   GLUCOSE 86 12/27/2015   CHOL 189 10/30/2014   TRIG 151.0 (H) 10/30/2014   HDL 41.30 10/30/2014   LDLDIRECT 178.0 09/24/2011   LDLCALC 118 (H) 10/30/2014   ALT 19 09/01/2015   AST 24 09/01/2015   NA 140 12/27/2015   K 4.1 12/27/2015   CL 106 12/27/2015   CREATININE 0.65 12/27/2015   BUN 13 12/27/2015   CO2 27 12/27/2015   TSH 1.991 02/01/2015   INR 1.7 10/18/2015    No results found.  Assessment & Plan:   There are no diagnoses linked to this encounter. I am having Alyssa Luna maintain her Cyanocobalamin (VITAMIN B-12 IJ), hydrocortisone, losartan, carvedilol, alum & mag hydroxide-simeth, Simethicone (GAS-X PO), denosumab, Cholecalciferol, Rivaroxaban, famotidine, ciclopirox, VASCULERA, flecainide, gabapentin, and Wheat Dextrin (BENEFIBER PO).  Meds ordered this encounter  Medications  . Wheat Dextrin (BENEFIBER PO)    Sig: Take by mouth.     Follow-up: No Follow-up on file.  Walker Kehr, MD

## 2016-05-06 ENCOUNTER — Other Ambulatory Visit: Payer: Self-pay | Admitting: Gastroenterology

## 2016-05-08 ENCOUNTER — Telehealth: Payer: Self-pay | Admitting: Internal Medicine

## 2016-05-08 NOTE — Telephone Encounter (Signed)
PLEASE NOTE: All timestamps contained within this report are represented as [place] Standard Time. CONFIDENTIALTY NOTICE: This fax transmission is intended only for the addressee. It contains information that is legally privileged, confidential or otherwise protected from use or disclosure. If you are not the intended recipient, you are strictly prohibited from reviewing, disclosing, copying using or disseminating any of this information or taking any action in reliance on or regarding this information. If you have received this fax in error, please notify us immediately by telephone so that we can arrange for its return to [place]. Phone: [phone], Toll-Free: [phone], Fax: [phone] Page: 1 of 1 Call Id: [id number] [place] Patient Name: Alyssa Luna DOB: [birth year] Initial Comment Caller states she's on Doxycline, for [facility] [place] spotted fever. She may be getting a yeast infection. She tested positive for the fever. Has questions about the tests. Nurse Assessment Nurse: Carlis Abbott, RN, Estill Bamberg Date/Time (Eastern Time): 05/08/2016 2:49:48 PM Confirm and document reason for call. If symptomatic, describe symptoms. You must click the next button to save text entered. ---Caller states she's on Doxycline, for [facility] spotted fever. She may be getting a yeast infection. She tested positive for the fever. Has questions about the tests. She is having vaginal itching and irritation. She has some vaginal discharge. This started yesterday. Denies fever, or other sx. Has the patient traveled out of the country within the last 30 days? ---No Does the patient have any new or worsening symptoms? ---Yes Will a triage be completed? ---Yes Related visit to physician within the last 2 weeks? ---Yes Does the PT have any chronic conditions? (i.e. diabetes, asthma, etc.) ---Yes List chronic conditions. ---a fib and  pinched nerves in her back Is this a behavioral health or substance abuse call? ---No Guidelines Guideline Title Affirmed Question Affirmed Notes Vaginal Discharge Symptoms of a vaginal yeast infection (i.e., white, thick, cottage-cheese-like, itchy, not bad smelling discharge) Final Disposition User [place], RN, Estill Bamberg Comments caller says that she has used Monistat before and she just wants to try using that at this point for the sx. Disagree/Comply: Comply

## 2016-05-08 NOTE — Telephone Encounter (Signed)
OK Monistat Thx

## 2016-05-11 ENCOUNTER — Other Ambulatory Visit: Payer: Self-pay | Admitting: Gastroenterology

## 2016-05-16 ENCOUNTER — Ambulatory Visit (HOSPITAL_COMMUNITY)
Admission: EM | Admit: 2016-05-16 | Discharge: 2016-05-16 | Disposition: A | Discharge status: Home / Self Care | Payer: Medicare Other | Attending: Family Medicine | Admitting: Family Medicine

## 2016-05-16 ENCOUNTER — Encounter (HOSPITAL_COMMUNITY): Payer: Self-pay | Admitting: *Deleted

## 2016-05-16 DIAGNOSIS — B373 Candidiasis of vulva and vagina: Secondary | ICD-10-CM

## 2016-05-16 DIAGNOSIS — T887XXA Unspecified adverse effect of drug or medicament, initial encounter: Secondary | ICD-10-CM | POA: Diagnosis not present

## 2016-05-16 DIAGNOSIS — T50905A Adverse effect of unspecified drugs, medicaments and biological substances, initial encounter: Secondary | ICD-10-CM

## 2016-05-16 DIAGNOSIS — B3731 Acute candidiasis of vulva and vagina: Secondary | ICD-10-CM

## 2016-05-16 LAB — POCT URINALYSIS DIP (DEVICE)
Bilirubin Urine: NEGATIVE
Glucose, UA: NEGATIVE mg/dL
HGB URINE DIPSTICK: NEGATIVE
Ketones, ur: NEGATIVE mg/dL
Leukocytes, UA: NEGATIVE
NITRITE: NEGATIVE
PH: 6.5 (ref 5.0–8.0)
PROTEIN: NEGATIVE mg/dL
Specific Gravity, Urine: 1.02 (ref 1.005–1.030)
UROBILINOGEN UA: 0.2 mg/dL (ref 0.0–1.0)

## 2016-05-16 MED ORDER — TERCONAZOLE 80 MG VA SUPP: 80.0000 mg | Freq: Every day | VAGINAL | 0 refills | Status: DC

## 2016-05-16 MED ORDER — GI COCKTAIL ~~LOC~~
ORAL | Status: AC
  Filled 2016-05-16: qty 30

## 2016-05-16 MED ORDER — GI COCKTAIL ~~LOC~~
30.0000 mL | Freq: Once | ORAL | Status: AC
  Administered 2016-05-16: 30 mL via ORAL

## 2016-05-16 MED ORDER — FLUCONAZOLE 150 MG PO TABS: 150.0000 mg | ORAL_TABLET | Freq: Once | ORAL | 1 refills | Status: AC

## 2016-05-16 NOTE — ED Triage Notes (Signed)
Pt   Reports    Symptoms  Of  Burning  When    She  Urinated  As   Well as  A  Whitish  Vag   Discharge   She  Reports     Recently  Finished  A  Course  Of  doxycline    For a  Pos   Tick test      She  Also  Reports  Some   Epigastric   Pain
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
## 2016-05-20 ENCOUNTER — Telehealth: Payer: Self-pay | Admitting: Internal Medicine

## 2016-05-20 DIAGNOSIS — R5382 Chronic fatigue, unspecified: Secondary | ICD-10-CM

## 2016-05-20 DIAGNOSIS — A770 Spotted fever due to Rickettsia rickettsii: Secondary | ICD-10-CM

## 2016-05-20 NOTE — Telephone Encounter (Signed)
Pt called in and said that she finished her round of meds and she is still not feeling well.  She needs to know what she should do?    Best  [phone]

## 2016-05-22 NOTE — Telephone Encounter (Signed)
She states she completed the meds that PCP gave her Doxycycline. She still c/o left leg numbness, left ear itches like crazy and her throat and esophagus feels sore and then goes away.   She states additional testing needed to be done per [facility] re: her tick bites.

## 2016-05-22 NOTE — Telephone Encounter (Signed)
Patient is calling again about all this. Can you please follow up with her. Thank you.

## 2016-05-22 NOTE — Telephone Encounter (Signed)
Pt informed

## 2016-05-22 NOTE — Telephone Encounter (Signed)
Come for blood test on Mon Tylenol, Benadryl prn Thx

## 2016-05-25 ENCOUNTER — Telehealth: Payer: Self-pay

## 2016-05-25 ENCOUNTER — Other Ambulatory Visit: Payer: Medicare Other

## 2016-05-25 DIAGNOSIS — R5382 Chronic fatigue, unspecified: Secondary | ICD-10-CM

## 2016-05-25 DIAGNOSIS — A770 Spotted fever due to Rickettsia rickettsii: Secondary | ICD-10-CM

## 2016-05-25 NOTE — Telephone Encounter (Signed)
Patient wanted me to call her for reminder of prolia injection due around 2nd week in October---I  Have left message on both cell and home phone with that reminder---estimated $85 copay for prolia injection---can schedule nurse visit or talk with Laniqua Torrens if any questions

## 2016-05-27 LAB — LYME ABY, WSTRN BLT IGG & IGM W/BANDS
B BURGDORFERI IGG ABS (IB): NEGATIVE
B burgdorferi IgM Abs (IB): NEGATIVE
LYME DISEASE 18 KD IGG: NONREACTIVE
LYME DISEASE 23 KD IGG: NONREACTIVE
LYME DISEASE 23 KD IGM: NONREACTIVE
LYME DISEASE 39 KD IGG: NONREACTIVE
LYME DISEASE 39 KD IGM: NONREACTIVE
Lyme Disease 28 kD IgG: NONREACTIVE
Lyme Disease 30 kD IgG: NONREACTIVE
Lyme Disease 41 kD IgG: REACTIVE — AB
Lyme Disease 41 kD IgM: NONREACTIVE
Lyme Disease 45 kD IgG: NONREACTIVE
Lyme Disease 58 kD IgG: NONREACTIVE
Lyme Disease 66 kD IgG: NONREACTIVE
Lyme Disease 93 kD IgG: NONREACTIVE

## 2016-05-28 LAB — RMSF, IGG, IFA: RMSF, IGG, IFA: 1:64 {titer} — ABNORMAL HIGH

## 2016-05-28 LAB — ROCKY MTN SPOTTED FVR AB, IGM-BLOOD: RMSF IGM: 0.26 {index} (ref 0.00–0.89)

## 2016-05-28 LAB — ROCKY MTN SPOTTED FVR AB, IGG-BLOOD: RMSF IGG: POSITIVE — AB

## 2016-06-05 ENCOUNTER — Ambulatory Visit (INDEPENDENT_AMBULATORY_CARE_PROVIDER_SITE_OTHER): Payer: Medicare Other | Admitting: Internal Medicine

## 2016-06-05 ENCOUNTER — Encounter: Payer: Self-pay | Admitting: Internal Medicine

## 2016-06-05 DIAGNOSIS — F411 Generalized anxiety disorder: Secondary | ICD-10-CM | POA: Diagnosis not present

## 2016-06-05 DIAGNOSIS — A770 Spotted fever due to Rickettsia rickettsii: Secondary | ICD-10-CM

## 2016-06-05 DIAGNOSIS — E538 Deficiency of other specified B group vitamins: Secondary | ICD-10-CM

## 2016-06-05 DIAGNOSIS — R42 Dizziness and giddiness: Secondary | ICD-10-CM

## 2016-06-05 MED ORDER — CLONAZEPAM 0.125 MG PO TBDP
0.1250 mg | ORAL_TABLET | Freq: Two times a day (BID) | ORAL | 3 refills | Status: DC | PRN
Start: 2016-06-05 — End: 2016-11-04

## 2016-06-05 MED ORDER — CYANOCOBALAMIN 1000 MCG/ML IJ SOLN
1000.0000 ug | Freq: Once | INTRAMUSCULAR | Status: AC
  Administered 2016-06-05: 1000 ug via INTRAMUSCULAR

## 2016-06-05 MED ORDER — CLONAZEPAM 0.125 MG PO TBDP: 0.1250 mg | ORAL_TABLET | Freq: Two times a day (BID) | ORAL | 3 refills | Status: DC | PRN

## 2016-06-05 NOTE — Assessment & Plan Note (Signed)
9/17 - possible  ER visit for face numbness and leg weakness: RMSF IFA was 1:320; Lyme serology was (-). No abx was given - possible RMSF and Lyme exposure in August 2017. Blood tests were ordered at the [place] at [facility] when she presented with face numbness. Her RMSF titer came back (+) 1:320. C/o not feeling well for a long time. The pt is very worried about possible effects of Lyme on her heart and general health. She finished Doxy in spite of side effects. RMSF, IGG, IFA Neg <1:64 1:64     B burgdorferi IgG Abs (IB) Negative   Lyme Disease 18 kD IgG Non Reactive   Lyme Disease 23 kD IgG Non Reactive   Lyme Disease 28 kD IgG Non Reactive   Lyme Disease 30 kD IgG Non Reactive   Lyme Disease 39 kD IgG Non Reactive   Lyme Disease 41 kD IgG Reactive    Lyme Disease 45 kD IgG Non Reactive   Lyme Disease 58 kD IgG Non Reactive   Lyme Disease 66 kD IgG Non Reactive   Lyme Disease 93 kD IgG Non Reactive     ID consult

## 2016-06-05 NOTE — Assessment & Plan Note (Addendum)
9/17 - possible  ER visit for face numbness and leg weakness: RMSF IFA was 1:320; Lyme serology was (-). No abx was given - possible RMSF and Lyme exposure in August 2017. Blood tests were ordered at the [facility] when she presented with face numbness. Her RMSF titer came back (+) 1:320. C/o not feeling well for a long time. The pt is very worried about possible effects of Lyme on her heart and general health. She finished Doxy in spite of side effects. RMSF, IGG, IFA Neg <1:64 1:64     B burgdorferi IgG Abs (IB) Negative   Lyme Disease 18 kD IgG Non Reactive   Lyme Disease 23 kD IgG Non Reactive   Lyme Disease 28 kD IgG Non Reactive   Lyme Disease 30 kD IgG Non Reactive   Lyme Disease 39 kD IgG Non Reactive   Lyme Disease 41 kD IgG Reactive    Lyme Disease 45 kD IgG Non Reactive   Lyme Disease 58 kD IgG Non Reactive   Lyme Disease 66 kD IgG Non Reactive   Lyme Disease 93 kD IgG Non Reactive     ID consult

## 2016-06-05 NOTE — Assessment & Plan Note (Signed)
On B12 

## 2016-06-05 NOTE — Progress Notes (Signed)
Pre visit review using our clinic review tool, if applicable. No additional management support is needed unless otherwise documented below in the visit note. 

## 2016-06-05 NOTE — Addendum Note (Signed)
Addended by: Karle Barr on: 06/05/2016 05:39 PM   Modules accepted: Orders

## 2016-06-05 NOTE — Progress Notes (Signed)
Subjective:  Patient ID: Alyssa Luna, female    DOB: [birth year]  Age: 82 y.o. MRN: [id number]  CC: No chief complaint on file.   HPI Alyssa Luna presents for a possible RMSF and Lyme exposure in August 2017. Blood tests were ordered at the [facility] when she presented with face numbness. Her RMSF titer came back (+) 1:320. C/o not feeling well for a long time. The pt is very worried about possible effects of Lyme on her heart and general health. She finished Doxy in spite of side effects.   RMSF, IGG, IFA Neg <1:64 1:64     B burgdorferi IgG Abs (IB) Negative   Lyme Disease 18 kD IgG Non Reactive   Lyme Disease 23 kD IgG Non Reactive   Lyme Disease 28 kD IgG Non Reactive   Lyme Disease 30 kD IgG Non Reactive   Lyme Disease 39 kD IgG Non Reactive   Lyme Disease 41 kD IgG Reactive    Lyme Disease 45 kD IgG Non Reactive   Lyme Disease 58 kD IgG Non Reactive   Lyme Disease 66 kD IgG Non Reactive   Lyme Disease 93 kD IgG Non Reactive      Outpatient Medications Prior to Visit  Medication Sig Dispense Refill  . alum & mag hydroxide-simeth (MAALOX/MYLANTA) 200-200-20 MG/5ML suspension Take by mouth every 6 (six) hours as needed for indigestion or heartburn.    . carvedilol (COREG) 3.125 MG tablet Take 1 tablet (3.125 mg total) by mouth 2 (two) times daily. 180 tablet 3  . Cyanocobalamin (VITAMIN B-12 IJ) Inject as directed every 30 (thirty) days.    [name] denosumab (PROLIA) 60 MG/ML SOLN injection Inject 60 mg into the skin every 6 (six) months. Administer in upper arm, thigh, or abdomen Unable to tolerate oral meds Dx severe osteoporosis 1.8 mL 6  . Dietary Management Product (VASCULERA) TABS Take 1 tablet by mouth daily. 30 tablet 11  . famotidine (PEPCID) 20 MG tablet Take 1 tablet by mouth 2 (two) times daily.  3  . famotidine (PEPCID) 20 MG tablet TAKE 1 TABLET (20 MG TOTAL) BY MOUTH 2 (TWO) TIMES DAILY. 60 tablet 3  . famotidine (PEPCID) 20 MG  tablet TAKE 1 TABLET (20 MG TOTAL) BY MOUTH 2 (TWO) TIMES DAILY. 60 tablet 3  . gabapentin (NEURONTIN) 100 MG capsule Take 1-2 capsules (100-200 mg total) by mouth 4 (four) times daily. 240 capsule 5  . hydrocortisone 2.5 % cream Apply 1 application topically 3 (three) times daily as needed (itching).    [name] losartan (COZAAR) 50 MG tablet Take 0.5 tablets (25 mg total) by mouth daily. (Patient taking differently: Take 25 mg by mouth daily as needed (BP over 150). ) 30 tablet 5  . Rivaroxaban (XARELTO) 15 MG TABS tablet Take 1 tablet by mouth daily.    . Simethicone (GAS-X PO) Take 1 tablet by mouth daily as needed (flatulence). Reported on 09/01/2015    . Wheat Dextrin (BENEFIBER PO) Take by mouth.    . Cholecalciferol 1000 units tablet Take 2 tablets (2,000 Units total) by mouth daily. D-3 (Patient not taking: Reported on 06/05/2016) 100 tablet 11  . ciclopirox (PENLAC) 8 % solution Apply topically at bedtime. Apply over nail and surrounding skin. Apply daily over previous coat. After seven (7) days, may remove with alcohol and continue cycle. (Patient not taking: Reported on 06/05/2016) 6.6 mL 0  . flecainide (TAMBOCOR) 50 MG tablet Take 1 and 1/2 tablets  by  mouth two times daily (Patient not taking: Reported on 06/05/2016) 270 tablet 0  . doxycycline (VIBRA-TABS) 100 MG tablet Take 1 tablet (100 mg total) by mouth 2 (two) times daily. (Patient not taking: Reported on 06/05/2016) 28 tablet 0  . terconazole (TERAZOL 3) 80 MG vaginal suppository Place 1 suppository (80 mg total) vaginally at bedtime. (Patient not taking: Reported on 06/05/2016) 3 suppository 0   No facility-administered medications prior to visit.     ROS Review of Systems  Constitutional: Positive for fatigue. Negative for activity change, appetite change, chills and unexpected weight change.  HENT: Negative for congestion, mouth sores and sinus pressure.   Eyes: Negative for visual disturbance.  Respiratory: Negative for cough  and chest tightness.   Gastrointestinal: Positive for nausea. Negative for abdominal pain.  Genitourinary: Negative for difficulty urinating, frequency and vaginal pain.  Musculoskeletal: Positive for gait problem. Negative for back pain.  Skin: Negative for pallor and rash.  Neurological: Positive for dizziness, light-headedness and numbness. Negative for tremors, weakness and headaches.  Psychiatric/Behavioral: Positive for dysphoric mood. Negative for confusion, sleep disturbance and suicidal ideas. The patient is nervous/anxious.     Objective:  BP (!) 160/80   Pulse (!) 57   Temp 98 F (36.7 C) (Oral)   Wt 132 lb (59.9 kg)   SpO2 96%   BMI 22.66 kg/m   BP Readings from Last 3 Encounters:  06/05/16 (!) 160/80  05/16/16 146/77  04/29/16 120/78    Wt Readings from Last 3 Encounters:  06/05/16 132 lb (59.9 kg)  04/29/16 133 lb (60.3 kg)  04/17/16 135 lb (61.2 kg)    Physical Exam  Constitutional: She appears well-developed. No distress.  HENT:  Head: Normocephalic.  Right Ear: External ear normal.  Left Ear: External ear normal.  Nose: Nose normal.  Mouth/Throat: Oropharynx is clear and moist.  Eyes: Conjunctivae are normal. Pupils are equal, round, and reactive to light. Right eye exhibits no discharge. Left eye exhibits no discharge.  Neck: Normal range of motion. Neck supple. No JVD present. No tracheal deviation present. No thyromegaly present.  Cardiovascular: Normal rate, regular rhythm and normal heart sounds.   Pulmonary/Chest: No stridor. No respiratory distress. She has no wheezes.  Abdominal: Soft. Bowel sounds are normal. She exhibits no distension and no mass. There is no tenderness. There is no rebound and no guarding.  Musculoskeletal: She exhibits tenderness. She exhibits no edema.  Lymphadenopathy:    She has no cervical adenopathy.  Neurological: She displays normal reflexes. No cranial nerve deficit. She exhibits normal muscle tone. Coordination  abnormal.  Skin: No rash noted. No erythema.  Psychiatric: She has a normal mood and affect. Her behavior is normal. Judgment and thought content normal.  Walker  Lab Results  Component Value Date   WBC 5.0 04/29/2016   HGB 13.2 04/29/2016   HCT 39.2 04/29/2016   PLT 244.0 04/29/2016   GLUCOSE 80 04/29/2016   CHOL 189 10/30/2014   TRIG 151.0 (H) 10/30/2014   HDL 41.30 10/30/2014   LDLDIRECT 178.0 09/24/2011   LDLCALC 118 (H) 10/30/2014   ALT 16 04/29/2016   AST 20 04/29/2016   NA 138 04/29/2016   K 4.2 04/29/2016   CL 104 04/29/2016   CREATININE 0.69 04/29/2016   BUN 13 04/29/2016   CO2 31 04/29/2016   TSH 1.991 02/01/2015   INR 1.7 10/18/2015    No results found.  Assessment & Plan:   There are no diagnoses linked to this  encounter. I have discontinued Ms. Swayze's doxycycline and terconazole. I am also having her maintain her Cyanocobalamin (VITAMIN B-12 IJ), hydrocortisone, losartan, carvedilol, alum & mag hydroxide-simeth, Simethicone (GAS-X PO), denosumab, Cholecalciferol, Rivaroxaban, famotidine, ciclopirox, VASCULERA, flecainide, gabapentin, Wheat Dextrin (BENEFIBER PO), famotidine, and famotidine.  No orders of the defined types were placed in this encounter.    Follow-up: No Follow-up on file.  Walker Kehr, MD

## 2016-06-05 NOTE — Assessment & Plan Note (Signed)
10/17 chronic - psychology ref was offered - declined Low dose Clonazepam  Potential benefits of a long term benzodiazepines  use as well as potential risks  and complications were explained to the patient and were aknowledged.

## 2016-06-13 ENCOUNTER — Other Ambulatory Visit: Payer: Self-pay | Admitting: Internal Medicine

## 2016-06-16 ENCOUNTER — Telehealth: Payer: Self-pay | Admitting: Internal Medicine

## 2016-06-16 NOTE — Telephone Encounter (Signed)
Pt informed

## 2016-06-16 NOTE — Telephone Encounter (Signed)
Alyssa Luna,  Please inform patient that our Infectious Disease specialist has reviewed her labs and said that she does not need an appointment with them because she does not have Lyme disease and because RMSF does not cause chronic disease. Thx

## 2016-06-23 ENCOUNTER — Emergency Department (HOSPITAL_COMMUNITY)
Admission: EM | Admit: 2016-06-23 | Discharge: 2016-06-23 | Disposition: A | Discharge status: Home / Self Care | Payer: Medicare Other | Attending: Emergency Medicine | Admitting: Emergency Medicine

## 2016-06-23 ENCOUNTER — Encounter (HOSPITAL_COMMUNITY): Payer: Self-pay | Admitting: *Deleted

## 2016-06-23 ENCOUNTER — Emergency Department (HOSPITAL_COMMUNITY): Payer: Medicare Other

## 2016-06-23 DIAGNOSIS — I10 Essential (primary) hypertension: Secondary | ICD-10-CM | POA: Diagnosis not present

## 2016-06-23 DIAGNOSIS — Z7901 Long term (current) use of anticoagulants: Secondary | ICD-10-CM | POA: Diagnosis not present

## 2016-06-23 DIAGNOSIS — Z8673 Personal history of transient ischemic attack (TIA), and cerebral infarction without residual deficits: Secondary | ICD-10-CM | POA: Diagnosis not present

## 2016-06-23 DIAGNOSIS — Z853 Personal history of malignant neoplasm of breast: Secondary | ICD-10-CM | POA: Insufficient documentation

## 2016-06-23 DIAGNOSIS — I2510 Atherosclerotic heart disease of native coronary artery without angina pectoris: Secondary | ICD-10-CM | POA: Diagnosis not present

## 2016-06-23 DIAGNOSIS — Z79899 Other long term (current) drug therapy: Secondary | ICD-10-CM | POA: Insufficient documentation

## 2016-06-23 DIAGNOSIS — Z96653 Presence of artificial knee joint, bilateral: Secondary | ICD-10-CM | POA: Insufficient documentation

## 2016-06-23 DIAGNOSIS — I480 Paroxysmal atrial fibrillation: Secondary | ICD-10-CM | POA: Diagnosis not present

## 2016-06-23 DIAGNOSIS — R079 Chest pain, unspecified: Secondary | ICD-10-CM | POA: Diagnosis present

## 2016-06-23 LAB — BASIC METABOLIC PANEL
ANION GAP: 7 (ref 5–15)
BUN: 9 mg/dL (ref 6–20)
CO2: 25 mmol/L (ref 22–32)
Calcium: 9.6 mg/dL (ref 8.9–10.3)
Chloride: 109 mmol/L (ref 101–111)
Creatinine, Ser: 0.7 mg/dL (ref 0.44–1.00)
GFR calc non Af Amer: >60 mL/min (ref >60)
Glucose, Bld: 90 mg/dL (ref 65–99)
Potassium: 4 mmol/L (ref 3.5–5.1)
SODIUM: 141 mmol/L (ref 135–145)

## 2016-06-23 LAB — I-STAT TROPONIN, ED: Troponin i, poc: 0 ng/mL (ref 0.00–0.08)

## 2016-06-23 LAB — CBC
HEMATOCRIT: 38.7 % (ref 36.0–46.0)
Hemoglobin: 12.9 g/dL (ref 12.0–15.0)
MCH: 29 pg (ref 26.0–34.0)
MCHC: 33.3 g/dL (ref 30.0–36.0)
MCV: 87 fL (ref 78.0–100.0)
Platelets: 214 10*3/uL (ref 150–400)
RBC: 4.45 MIL/uL (ref 3.87–5.11)
RDW: 13.5 % (ref 11.5–15.5)
WBC: 5.1 10*3/uL (ref 4.0–10.5)
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
## 2016-06-23 NOTE — ED Triage Notes (Signed)
Pt reports central chest pain and generalized weakness that has been intermittent for 2 days. Pt also has a hiatal hernia and thinks that that may have contributed to the pain.

## 2016-06-24 ENCOUNTER — Telehealth (HOSPITAL_COMMUNITY): Payer: Self-pay | Admitting: *Deleted

## 2016-06-24 ENCOUNTER — Encounter (HOSPITAL_COMMUNITY): Payer: Self-pay | Admitting: *Deleted

## 2016-06-24 NOTE — Telephone Encounter (Signed)
Pt showed up on ED -afib report, but will follow up with [facility] Cardiologist 06/26/16 per EPIC notes.  Will not bring pt into afib clinic at this time.

## 2016-06-30 ENCOUNTER — Ambulatory Visit (INDEPENDENT_AMBULATORY_CARE_PROVIDER_SITE_OTHER): Payer: Medicare Other

## 2016-06-30 ENCOUNTER — Telehealth: Payer: Self-pay | Admitting: Internal Medicine

## 2016-06-30 DIAGNOSIS — Z23 Encounter for immunization: Secondary | ICD-10-CM

## 2016-06-30 NOTE — Telephone Encounter (Signed)
Spoke with pt and she states she was diagnosed with [place] spotted fever in Sept and had to take doxycyline for several weeks. States she is having burning in her stomach again. Pt states she took a dose of carafate last night and it seemed to help. Pt wanted to know if she should come in and be seen. Discussed with pt that she could resume the carafate qid and try that for a week. Pt to call back if this does not help. She verbalized understanding.

## 2016-07-01 ENCOUNTER — Ambulatory Visit: Payer: Medicare Other | Admitting: Internal Medicine

## 2016-07-08 ENCOUNTER — Other Ambulatory Visit: Payer: Self-pay | Admitting: Internal Medicine

## 2016-07-13 ENCOUNTER — Encounter: Payer: Self-pay | Admitting: Family

## 2016-07-13 ENCOUNTER — Ambulatory Visit (INDEPENDENT_AMBULATORY_CARE_PROVIDER_SITE_OTHER): Payer: Medicare Other | Admitting: Family

## 2016-07-13 DIAGNOSIS — J069 Acute upper respiratory infection, unspecified: Secondary | ICD-10-CM

## 2016-07-13 MED ORDER — AZITHROMYCIN 250 MG PO TABS: ORAL_TABLET | ORAL | 0 refills | Status: DC

## 2016-07-13 NOTE — Assessment & Plan Note (Signed)
Symptoms and exam consistent with acute upper respiratory infection. Start azithromycin. Continue over-the-counter medications as needed for symptom relief and supportive care. Pain currently experience appears consistent with neuropathic origin. Patient has significant concerns regarding possible [facility] spotted fever residual effects. Advised to follow-up with PCP if symptoms do not improve.

## 2016-07-13 NOTE — Patient Instructions (Signed)
Thank you for choosing [facility].  SUMMARY AND INSTRUCTIONS:  For the achyness try ice, moist heat, stretching and exercise.   Medication:  Your prescription(s) have been submitted to your pharmacy or been printed and provided for you. Please take as directed and contact our office if you believe you are having problem(s) with the medication(s) or have any questions.  Follow up:  If your symptoms worsen or fail to improve, please contact our office for further instruction, or in case of emergency go directly to the emergency room at the closest medical facility.   General Recommendations:    Please drink plenty of fluids.  Get plenty of rest   Sleep in humidified air  Use saline nasal sprays  Netti pot   OTC Medications:  Decongestants - helps relieve congestion   Flonase (generic fluticasone) or Nasacort (generic triamcinolone) - please make sure to use the "cross-over" technique at a 45 degree angle towards the opposite eye as opposed to straight up the nasal passageway.   Sudafed (generic pseudoephedrine - Note this is the one that is available behind the pharmacy counter); Products with phenylephrine (-PE) may also be used but is often not as effective as pseudoephedrine.   If you have HIGH BLOOD PRESSURE - Coricidin HBP; AVOID any product that is -D as this contains pseudoephedrine which may increase your blood pressure.  Afrin (oxymetazoline) every 6-8 hours for up to 3 days.   Allergies - helps relieve runny nose, itchy eyes and sneezing   Claritin (generic loratidine), Allegra (fexofenidine), or Zyrtec (generic cyrterizine) for runny nose. These medications should not cause drowsiness.  Note - Benadryl (generic diphenhydramine) may be used however may cause drowsiness  Cough -   Delsym or Robitussin (generic dextromethorphan)  Expectorants - helps loosen mucus to ease removal   Mucinex (generic guaifenesin) as directed on the  package.  Headaches / General Aches   Tylenol (generic acetaminophen) - DO NOT EXCEED 3 grams (3,000 mg) in a 24 hour time period  Advil/Motrin (generic ibuprofen)   Sore Throat -   Salt water gargle   Chloraseptic (generic benzocaine) spray or lozenges / Sucrets (generic dyclonine)
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
## 2016-08-05 ENCOUNTER — Encounter: Payer: Self-pay | Admitting: Internal Medicine

## 2016-08-05 ENCOUNTER — Ambulatory Visit (INDEPENDENT_AMBULATORY_CARE_PROVIDER_SITE_OTHER): Payer: Medicare Other | Admitting: Internal Medicine

## 2016-08-05 DIAGNOSIS — M25462 Effusion, left knee: Secondary | ICD-10-CM

## 2016-08-05 DIAGNOSIS — E538 Deficiency of other specified B group vitamins: Secondary | ICD-10-CM | POA: Diagnosis not present

## 2016-08-05 DIAGNOSIS — G8929 Other chronic pain: Secondary | ICD-10-CM

## 2016-08-05 DIAGNOSIS — M5440 Lumbago with sciatica, unspecified side: Secondary | ICD-10-CM

## 2016-08-05 DIAGNOSIS — R110 Nausea: Secondary | ICD-10-CM

## 2016-08-05 DIAGNOSIS — M810 Age-related osteoporosis without current pathological fracture: Secondary | ICD-10-CM

## 2016-08-05 MED ORDER — PREGABALIN 50 MG PO CAPS: 50.0000 mg | ORAL_CAPSULE | Freq: Two times a day (BID) | ORAL | 3 refills | Status: DC

## 2016-08-05 MED ORDER — CYANOCOBALAMIN 1000 MCG/ML IJ SOLN
1000.0000 ug | Freq: Once | INTRAMUSCULAR | Status: AC
  Administered 2016-08-05: 1000 ug via INTRAMUSCULAR

## 2016-08-05 NOTE — Progress Notes (Signed)
Pre visit review using our clinic review tool, if applicable. No additional management support is needed unless otherwise documented below in the visit note. 

## 2016-08-05 NOTE — Assessment & Plan Note (Signed)
Resolved

## 2016-08-05 NOTE — Progress Notes (Signed)
Subjective:  Patient ID: Alyssa Luna, female    DOB: September 07, 1933  Age: 82 y.o. MRN: [id number]  CC: No chief complaint on file.   HPI [name] presents for episodic fatigue, fogginess and arthralgias   Outpatient Medications Prior to Visit  Medication Sig Dispense Refill  . alum & mag hydroxide-simeth (MAALOX/MYLANTA) 200-200-20 MG/5ML suspension Take 15 mLs by mouth every 6 (six) hours as needed for indigestion or heartburn.     [name] azithromycin (ZITHROMAX) 250 MG tablet Take 2 tablets by mouth for 1 day and then 1 tablet by mouth daily for 4 days. 6 tablet 0  . carvedilol (COREG) 3.125 MG tablet TAKE 1 TABLET BY MOUTH TWICE A DAY 180 tablet 4  . ciclopirox (PENLAC) 8 % solution Apply topically at bedtime. Apply over nail and surrounding skin. Apply daily over previous coat. After seven (7) days, may remove with alcohol and continue cycle. 6.6 mL 0  . clonazepam (KLONOPIN) 0.125 MG disintegrating tablet Take 1 tablet (0.125 mg total) by mouth 2 (two) times daily as needed for seizure. (Patient taking differently: Take 0.125 mg by mouth 2 (two) times daily as needed (for anxiety). ) 60 tablet 3  . Cyanocobalamin (VITAMIN B-12 IJ) Inject as directed every 30 (thirty) days.    [name] denosumab (PROLIA) 60 MG/ML SOLN injection Inject 60 mg into the skin every 6 (six) months. Administer in upper arm, thigh, or abdomen Unable to tolerate oral meds Dx severe osteoporosis 1.8 mL 6  . famotidine (PEPCID) 20 MG tablet TAKE 1 TABLET (20 MG TOTAL) BY MOUTH 2 (TWO) TIMES DAILY. 60 tablet 3  . gabapentin (NEURONTIN) 100 MG capsule Take 1-2 capsules (100-200 mg total) by mouth 4 (four) times daily. (Patient taking differently: Take 100 mg by mouth 4 (four) times daily. ) 240 capsule 5  . hydrocortisone 2.5 % cream Apply 1 application topically 3 (three) times daily as needed (itching).    . Rivaroxaban (XARELTO) 15 MG TABS tablet Take 15 mg by mouth daily.     . Simethicone (GAS-X PO) Take 1  tablet by mouth daily as needed (flatulence). Reported on 09/01/2015    . Wheat Dextrin (BENEFIBER PO) Take by mouth. Mix 2 teaspoonsful in 6-8 ounces of water and drink once daily     No facility-administered medications prior to visit.     ROS Review of Systems  Constitutional: Positive for fatigue. Negative for activity change, appetite change, chills and unexpected weight change.  HENT: Negative for congestion, mouth sores and sinus pressure.   Eyes: Negative for visual disturbance.  Respiratory: Negative for cough and chest tightness.   Gastrointestinal: Negative for abdominal pain and nausea.  Genitourinary: Negative for difficulty urinating, frequency and vaginal pain.  Musculoskeletal: Positive for arthralgias and back pain. Negative for gait problem.  Skin: Negative for pallor and rash.  Neurological: Negative for dizziness, tremors, weakness, numbness and headaches.  Psychiatric/Behavioral: Negative for confusion and sleep disturbance. The patient is nervous/anxious.     Objective:  BP (!) 150/90   Pulse 66   Wt 136 lb (61.7 kg)   SpO2 96%   BMI 24.09 kg/m   BP Readings from Last 3 Encounters:  08/05/16 (!) 150/90  07/13/16 (!) 154/84  06/23/16 136/64    Wt Readings from Last 3 Encounters:  08/05/16 136 lb (61.7 kg)  07/13/16 136 lb (61.7 kg)  06/23/16 131 lb (59.4 kg)    Physical Exam  Constitutional: She appears well-developed. No distress.  HENT:  Head: Normocephalic.  Right Ear: External ear normal.  Left Ear: External ear normal.  Nose: Nose normal.  Mouth/Throat: Oropharynx is clear and moist.  Eyes: Conjunctivae are normal. Pupils are equal, round, and reactive to light. Right eye exhibits no discharge. Left eye exhibits no discharge.  Neck: Normal range of motion. Neck supple. No JVD present. No tracheal deviation present. No thyromegaly present.  Cardiovascular: Normal rate, regular rhythm and normal heart sounds.   Pulmonary/Chest: No stridor. No  respiratory distress. She has no wheezes.  Abdominal: Soft. Bowel sounds are normal. She exhibits no distension and no mass. There is no tenderness. There is no rebound and no guarding.  Musculoskeletal: She exhibits tenderness. She exhibits no edema.  Lymphadenopathy:    She has no cervical adenopathy.  Neurological: She displays normal reflexes. No cranial nerve deficit. She exhibits normal muscle tone. Coordination abnormal.  Skin: No rash noted. No erythema.  Psychiatric: She has a normal mood and affect. Her behavior is normal. Judgment and thought content normal.    Lab Results  Component Value Date   WBC 5.1 06/23/2016   HGB 12.9 06/23/2016   HCT 38.7 06/23/2016   PLT 214 06/23/2016   GLUCOSE 90 06/23/2016   CHOL 189 10/30/2014   TRIG 151.0 (H) 10/30/2014   HDL 41.30 10/30/2014   LDLDIRECT 178.0 09/24/2011   LDLCALC 118 (H) 10/30/2014   ALT 16 04/29/2016   AST 20 04/29/2016   NA 141 06/23/2016   K 4.0 06/23/2016   CL 109 06/23/2016   CREATININE 0.70 06/23/2016   BUN 9 06/23/2016   CO2 25 06/23/2016   TSH 1.991 02/01/2015   INR 1.7 10/18/2015    Dg Chest 2 View  Result Date: 06/23/2016 CLINICAL DATA:  Centralized chest pain and shortness breath. Chills. Atrial fibrillation. EXAM: CHEST  2 VIEW COMPARISON:  Two-view chest x-ray 02/01/2015 FINDINGS: Heart size is normal. Atherosclerotic calcifications are present at the aortic arch. A moderate-sized hiatal hernia is again seen. Emphysematous changes are noted. Mild bibasilar airspace disease likely reflects atelectasis. No edema or effusion is present. Bilateral breast implants are noted. Surgical clips are present at the left axilla. Moderate osteopenia is present. Exaggerated kyphosis is noted. Remote thoracolumbar compression fractures are stable. Augmentation is noted at the L1 fracture. IMPRESSION: 1. No acute cardiopulmonary disease or significant interval change. 2. Emphysema. 3. Moderate-sized hiatal hernia. 4. Stable  thoraco lumbar compression fractures. Electronically Signed   By: San Morelle M.D.   On: 06/23/2016 16:29    Assessment & Plan:   Diagnoses and all orders for this visit:  B12 deficiency -     cyanocobalamin ((VITAMIN B-12)) injection 1,000 mcg; Inject 1 mL (1,000 mcg total) into the muscle once.  Other orders -     pregabalin (LYRICA) 50 MG capsule; Take 1 capsule (50 mg total) by mouth 2 (two) times daily.   I am having Ms. Busta start on pregabalin. I am also having her maintain her Cyanocobalamin (VITAMIN B-12 IJ), hydrocortisone, alum & mag hydroxide-simeth, Simethicone (GAS-X PO), denosumab, Rivaroxaban, ciclopirox, gabapentin, Wheat Dextrin (BENEFIBER PO), famotidine, clonazepam, carvedilol, and azithromycin. We administered cyanocobalamin.  Meds ordered this encounter  Medications  . pregabalin (LYRICA) 50 MG capsule    Sig: Take 1 capsule (50 mg total) by mouth 2 (two) times daily.    Dispense:  60 capsule    Refill:  3  . cyanocobalamin ((VITAMIN B-12)) injection 1,000 mcg     Follow-up: No Follow-up on file.  Alex Plotnikov,  MD

## 2016-08-05 NOTE — Assessment & Plan Note (Signed)
On B12 

## 2016-08-05 NOTE — Assessment & Plan Note (Signed)
Gabapentin  We can try Lyrica at low dose instead

## 2016-08-05 NOTE — Assessment & Plan Note (Signed)
Pt would like to start Prolia in January 2018

## 2016-08-13 ENCOUNTER — Encounter (HOSPITAL_COMMUNITY): Payer: Self-pay | Admitting: Emergency Medicine

## 2016-08-13 ENCOUNTER — Ambulatory Visit (HOSPITAL_COMMUNITY)
Admission: EM | Admit: 2016-08-13 | Discharge: 2016-08-13 | Disposition: A | Discharge status: Home / Self Care | Payer: Medicare Other | Attending: Family Medicine | Admitting: Family Medicine

## 2016-08-13 DIAGNOSIS — J00 Acute nasopharyngitis [common cold]: Secondary | ICD-10-CM

## 2016-08-13 MED ORDER — IPRATROPIUM BROMIDE 0.06 % NA SOLN
2.0000 | Freq: Four times a day (QID) | NASAL | 0 refills | Status: DC
Start: 2016-08-13 — End: 2017-01-27

## 2016-08-13 NOTE — ED Triage Notes (Signed)
Pt c/o cold sx onset abd pain associated w/nauseas, BA, ST  Dx in September w/[facility] Spotted Fever  Reports she saw PCP last week and was given Lyrica for poss fibromyalgia   A&O x4... NAD

## 2016-08-13 NOTE — ED Provider Notes (Signed)
CSN: [id number]     Arrival date & time 08/13/16  1201 History   First MD Initiated Contact with Patient 08/13/16 1257     Chief Complaint  Patient presents with  . URI   (Consider location/radiation/quality/duration/timing/severity/associated sxs/prior Treatment) Patient has c/o uri sx's.   The history is provided by the patient.  URI  Presenting symptoms: congestion, cough and sore throat   Severity:  Moderate Onset quality:  Sudden Timing:  Constant Progression:  Unchanged Chronicity:  New Relieved by:  Nothing Worsened by:  Nothing   Past Medical History:  Diagnosis Date  . AF (atrial fibrillation) ([place])   . Anxiety    has lorazepam on hand for nervousness, pt. reports that she had a break-in to her home on 05/2014  . Atrial fibrillation ([place])    D Taylor  . Breast cancer ([place]) 1984  . Cataract   . Colon polyps   . Coronary artery disease   . Diverticulosis   . Diverticulosis of colon   . Dysrhythmia    atrial fib  . Esophageal dysmotility   . Familial tremor   . GERD (gastroesophageal reflux disease)   . Hemorrhoids   . Hiatal hernia   . History of breast cancer   . History of hiatal hernia   . Hyperlipidemia   . Hypertension   . Internal hemorrhoids   . LBP (low back pain)   . Neuromuscular disorder ([place])    essential tremor  . Osteoarthritis    hands & back & knees   . Osteoporosis   . Primary localized osteoarthritis of left knee   . Renal cyst   . Schatzki's ring   . Scoliosis   . Stroke ([place])   . TIA (transient ischemic attack)   . TR (tricuspid regurgitation)    Mild  . Vitamin B12 deficiency   . Vitamin D deficiency    Past Surgical History:  Procedure Laterality Date  . AUGMENTATION MAMMAPLASTY    . bilateral mastectomy    . BREAST ENHANCEMENT SURGERY    . BREAST SURGERY     Bilateral mastectomy  . COLONOSCOPY    . EYE SURGERY     cataracts removed- /w iol  & blepheroplasty  . JOINT REPLACEMENT Right   . kytoplasy    .  MASTECTOMY  1984   bilateral  . OOPHORECTOMY     BSO  . POLYPECTOMY    . TOTAL KNEE ARTHROPLASTY  Dec 2011   Right - Dr Noemi Chapel  . TOTAL KNEE ARTHROPLASTY Left 08/27/2014   dr Noemi Chapel  . TOTAL KNEE ARTHROPLASTY Left 08/27/2014   Procedure: LEFT TOTAL KNEE ARTHROPLASTY;  Surgeon: Lorn Junes, MD;  Location: [place];  Service: Orthopedics;  Laterality: Left;  [name] VAGINAL HYSTERECTOMY     LAVH BSO   Family History  Problem Relation Age of Onset  . Breast cancer Mother 65  . Tremor Mother   . Tremor Brother   . Colon cancer Maternal Aunt   . Ovarian cancer Maternal Grandmother   . Early death Neg Hx   . Stroke Neg Hx    Social History  Substance Use Topics  . Smoking status: Never Smoker  . Smokeless tobacco: Never Used  . Alcohol use No   OB History    Gravida Para Term Preterm AB Living   0 0 0 0 0     SAB TAB Ectopic Multiple Live Births   0 0 0  Review of Systems  Constitutional: Negative.   HENT: Positive for congestion and sore throat.   Eyes: Negative.   Respiratory: Positive for cough.   Cardiovascular: Negative.   Gastrointestinal: Negative.   Endocrine: Negative.   Genitourinary: Negative.   Musculoskeletal: Negative.   Allergic/Immunologic: Negative.   Neurological: Negative.   Hematological: Negative.   Psychiatric/Behavioral: Negative.     Allergies  Pravastatin; Colchicine; Actonel [risedronate sodium]; Alendronate; Augmentin [amoxicillin-pot clavulanate]; Ciprofloxacin; Evista [raloxifene]; Flagyl [metronidazole]; Fosamax [alendronate sodium]; Gold-containing drug products; Lexapro [escitalopram oxalate]; Risedronate; Simvastatin; and Tramadol  Home Medications   Prior to Admission medications   Medication Sig Start Date End Date Taking? Authorizing Provider  carvedilol (COREG) 3.125 MG tablet TAKE 1 TABLET BY MOUTH TWICE A DAY 07/08/16  Yes Evie Lacks Plotnikov, MD  Cyanocobalamin (VITAMIN B-12 IJ) Inject as directed every 30 (thirty) days.    Yes Historical Provider, MD  famotidine (PEPCID) 20 MG tablet TAKE 1 TABLET (20 MG TOTAL) BY MOUTH 2 (TWO) TIMES DAILY. 05/12/16  Yes Jerene Bears, MD  gabapentin (NEURONTIN) 100 MG capsule Take 1-2 capsules (100-200 mg total) by mouth 4 (four) times daily. Patient taking differently: Take 100 mg by mouth 4 (four) times daily.  04/10/16  Yes Cassandria Anger, MD  Rivaroxaban (XARELTO) 15 MG TABS tablet Take 15 mg by mouth daily.  10/21/15  Yes Historical Provider, MD  alum & mag hydroxide-simeth (MAALOX/MYLANTA) 200-200-20 MG/5ML suspension Take 15 mLs by mouth every 6 (six) hours as needed for indigestion or heartburn.     Historical Provider, MD  azithromycin (ZITHROMAX) 250 MG tablet Take 2 tablets by mouth for 1 day and then 1 tablet by mouth daily for 4 days. 07/13/16   Golden Circle, FNP  ciclopirox (PENLAC) 8 % solution Apply topically at bedtime. Apply over nail and surrounding skin. Apply daily over previous coat. After seven (7) days, may remove with alcohol and continue cycle. 02/04/16   Evie Lacks Plotnikov, MD  clonazepam (KLONOPIN) 0.125 MG disintegrating tablet Take 1 tablet (0.125 mg total) by mouth 2 (two) times daily as needed for seizure. Patient taking differently: Take 0.125 mg by mouth 2 (two) times daily as needed (for anxiety).  06/05/16   Evie Lacks Plotnikov, MD  denosumab (PROLIA) 60 MG/ML SOLN injection Inject 60 mg into the skin every 6 (six) months. Administer in upper arm, thigh, or abdomen Unable to tolerate oral meds Dx severe osteoporosis 09/18/15   Cassandria Anger, MD  hydrocortisone 2.5 % cream Apply 1 application topically 3 (three) times daily as needed (itching).    Historical Provider, MD  ipratropium (ATROVENT) 0.06 % nasal spray Place 2 sprays into both nostrils 4 (four) times daily. 08/13/16   Lysbeth Penner, FNP  pregabalin (LYRICA) 50 MG capsule Take 1 capsule (50 mg total) by mouth 2 (two) times daily. 08/05/16   Evie Lacks Plotnikov, MD  Simethicone  (GAS-X PO) Take 1 tablet by mouth daily as needed (flatulence). Reported on 09/01/2015    Historical Provider, MD  Wheat Dextrin (BENEFIBER PO) Take by mouth. Mix 2 teaspoonsful in 6-8 ounces of water and drink once daily    Historical Provider, MD   Meds Ordered and Administered this Visit  Medications - No data to display  BP 146/62 (BP Location: Right Arm)   Pulse (!) 56   Temp 97.7 F (36.5 C) (Oral)   Resp 18   SpO2 100%  No data found.   Physical Exam  Constitutional: She appears well-developed  and well-nourished.  HENT:  Head: Normocephalic and atraumatic.  Right Ear: External ear normal.  Left Ear: External ear normal.  Mouth/Throat: Oropharynx is clear and moist.  Eyes: Conjunctivae and EOM are normal. Pupils are equal, round, and reactive to light.  Neck: Normal range of motion. Neck supple.  Cardiovascular: Normal rate, regular rhythm and normal heart sounds.   Pulmonary/Chest: Effort normal.  Abdominal: Soft. Bowel sounds are normal.  Nursing note and vitals reviewed.   Urgent Care Course   Clinical Course     Procedures (including critical care time)  Labs Review Labs Reviewed - No data to display  Imaging Review No results found.   Visual Acuity Review  Right Eye Distance:   Left Eye Distance:   Bilateral Distance:    Right Eye Near:   Left Eye Near:    Bilateral Near:         MDM   1. Acute nasopharyngitis    Ipratropium nasal spray 0.06%  2 sprays per nostril qid prn #77m  Push po fluids, rest, tylenol and motrin otc prn as directed for fever, arthralgias, and myalgias.  Follow up prn if sx's continue or persist.    WLysbeth Penner FNP 08/13/16 1426

## 2016-08-13 NOTE — Discharge Instructions (Signed)
Please follow up with Dr. Alain Marion with aches and pains.

## 2016-08-14 ENCOUNTER — Ambulatory Visit: Payer: Medicare Other | Admitting: Internal Medicine

## 2016-08-28 ENCOUNTER — Ambulatory Visit: Payer: Medicare Other

## 2016-08-29 ENCOUNTER — Other Ambulatory Visit: Payer: Self-pay | Admitting: Internal Medicine

## 2016-09-04 ENCOUNTER — Encounter: Payer: Self-pay | Admitting: Internal Medicine

## 2016-09-07 ENCOUNTER — Ambulatory Visit (INDEPENDENT_AMBULATORY_CARE_PROVIDER_SITE_OTHER): Payer: Medicare Other

## 2016-09-07 DIAGNOSIS — E538 Deficiency of other specified B group vitamins: Secondary | ICD-10-CM

## 2016-09-07 DIAGNOSIS — M810 Age-related osteoporosis without current pathological fracture: Secondary | ICD-10-CM

## 2016-09-07 MED ORDER — DENOSUMAB 60 MG/ML ~~LOC~~ SOLN
60.0000 mg | Freq: Once | SUBCUTANEOUS | Status: AC
  Administered 2016-09-07: 60 mg via SUBCUTANEOUS

## 2016-09-07 MED ORDER — CYANOCOBALAMIN 1000 MCG/ML IJ SOLN
1000.0000 ug | Freq: Once | INTRAMUSCULAR | Status: AC
  Administered 2016-09-07: 1000 ug via INTRAMUSCULAR

## 2016-09-11 ENCOUNTER — Ambulatory Visit (INDEPENDENT_AMBULATORY_CARE_PROVIDER_SITE_OTHER): Payer: Medicare Other | Admitting: Internal Medicine

## 2016-09-11 ENCOUNTER — Encounter: Payer: Self-pay | Admitting: Internal Medicine

## 2016-09-11 DIAGNOSIS — M810 Age-related osteoporosis without current pathological fracture: Secondary | ICD-10-CM | POA: Diagnosis not present

## 2016-09-11 DIAGNOSIS — M159 Polyosteoarthritis, unspecified: Secondary | ICD-10-CM

## 2016-09-11 DIAGNOSIS — M150 Primary generalized (osteo)arthritis: Secondary | ICD-10-CM | POA: Diagnosis not present

## 2016-09-11 DIAGNOSIS — I10 Essential (primary) hypertension: Secondary | ICD-10-CM

## 2016-09-11 NOTE — Progress Notes (Signed)
Subjective:  Patient ID: Alyssa Luna, female    DOB: Dec 25, 1933  Age: 83 y.o. MRN: [id number]  CC: Edema (Bilateral legs and left forearm) and Generalized Body Aches   HPI NICOLLE HEWARD presents for OA, HTN, osteopenia f/u. C/o fatigue after Coreg. Cat bites legs  Outpatient Medications Prior to Visit  Medication Sig Dispense Refill  . alum & mag hydroxide-simeth (MAALOX/MYLANTA) 200-200-20 MG/5ML suspension Take 15 mLs by mouth every 6 (six) hours as needed for indigestion or heartburn.     . carvedilol (COREG) 3.125 MG tablet TAKE 1 TABLET BY MOUTH TWICE A DAY 180 tablet 4  . ciclopirox (PENLAC) 8 % solution Apply topically at bedtime. Apply over nail and surrounding skin. Apply daily over previous coat. After seven (7) days, may remove with alcohol and continue cycle. 6.6 mL 0  . clonazepam (KLONOPIN) 0.125 MG disintegrating tablet Take 1 tablet (0.125 mg total) by mouth 2 (two) times daily as needed for seizure. (Patient taking differently: Take 0.125 mg by mouth 2 (two) times daily as needed (for anxiety). ) 60 tablet 3  . Cyanocobalamin (VITAMIN B-12 IJ) Inject as directed every 30 (thirty) days.    [name] denosumab (PROLIA) 60 MG/ML SOLN injection Inject 60 mg into the skin every 6 (six) months. Administer in upper arm, thigh, or abdomen Unable to tolerate oral meds Dx severe osteoporosis 1.8 mL 6  . famotidine (PEPCID) 20 MG tablet TAKE 1 TABLET (20 MG TOTAL) BY MOUTH 2 (TWO) TIMES DAILY. 60 tablet 3  . gabapentin (NEURONTIN) 100 MG capsule Take 1-2 capsules (100-200 mg total) by mouth 4 (four) times daily. (Patient taking differently: Take 100 mg by mouth 4 (four) times daily. ) 240 capsule 5  . hydrocortisone 2.5 % cream Apply 1 application topically 3 (three) times daily as needed (itching).    [name] ipratropium (ATROVENT) 0.06 % nasal spray Place 2 sprays into both nostrils 4 (four) times daily. 15 mL 0  . Rivaroxaban (XARELTO) 15 MG TABS tablet Take 15 mg by mouth daily.     .  Simethicone (GAS-X PO) Take 1 tablet by mouth daily as needed (flatulence). Reported on 09/01/2015    . Wheat Dextrin (BENEFIBER PO) Take by mouth. Mix 2 teaspoonsful in 6-8 ounces of water and drink once daily    . azithromycin (ZITHROMAX) 250 MG tablet Take 2 tablets by mouth for 1 day and then 1 tablet by mouth daily for 4 days. 6 tablet 0  . gabapentin (NEURONTIN) 100 MG capsule TAKE 1 TO 2 CAPSULES BY  MOUTH 4 TIMES DAILY 720 capsule 1  . pregabalin (LYRICA) 50 MG capsule Take 1 capsule (50 mg total) by mouth 2 (two) times daily. (Patient not taking: Reported on 09/11/2016) 60 capsule 3   No facility-administered medications prior to visit.     ROS Review of Systems  Constitutional: Positive for fatigue. Negative for activity change, appetite change, chills and unexpected weight change.  HENT: Negative for congestion, mouth sores and sinus pressure.   Eyes: Negative for visual disturbance.  Respiratory: Negative for cough and chest tightness.   Gastrointestinal: Negative for abdominal pain and nausea.  Genitourinary: Negative for difficulty urinating, frequency and vaginal pain.  Musculoskeletal: Positive for arthralgias. Negative for back pain and gait problem.  Skin: Negative for pallor and rash.  Neurological: Negative for dizziness, tremors, weakness, numbness and headaches.  Psychiatric/Behavioral: Negative for confusion, sleep disturbance and suicidal ideas. The patient is nervous/anxious.     Objective:  BP 140/72  Pulse 60   Wt 138 lb (62.6 kg)   SpO2 98%   BMI 24.45 kg/m   BP Readings from Last 3 Encounters:  09/11/16 140/72  08/13/16 146/62  08/05/16 (!) 150/90    Wt Readings from Last 3 Encounters:  09/11/16 138 lb (62.6 kg)  08/05/16 136 lb (61.7 kg)  07/13/16 136 lb (61.7 kg)    Physical Exam  Constitutional: She appears well-developed. No distress.  HENT:  Head: Normocephalic.  Right Ear: External ear normal.  Left Ear: External ear normal.  Nose:  Nose normal.  Mouth/Throat: Oropharynx is clear and moist.  Eyes: Conjunctivae are normal. Pupils are equal, round, and reactive to light. Right eye exhibits no discharge. Left eye exhibits no discharge.  Neck: Normal range of motion. Neck supple. No JVD present. No tracheal deviation present. No thyromegaly present.  Cardiovascular: Normal rate, regular rhythm and normal heart sounds.   Pulmonary/Chest: No stridor. No respiratory distress. She has no wheezes.  Abdominal: Soft. Bowel sounds are normal. She exhibits no distension and no mass. There is no tenderness. There is no rebound and no guarding.  Musculoskeletal: She exhibits no edema.  Lymphadenopathy:    She has no cervical adenopathy.  Neurological: She displays normal reflexes. No cranial nerve deficit. She exhibits normal muscle tone. Coordination abnormal.  Skin: No rash noted. No erythema.  Psychiatric: She has a normal mood and affect. Her behavior is normal. Judgment and thought content normal.  Walker Knees tender Trace edema - no infected bites   Lab Results  Component Value Date   WBC 5.1 06/23/2016   HGB 12.9 06/23/2016   HCT 38.7 06/23/2016   PLT 214 06/23/2016   GLUCOSE 90 06/23/2016   CHOL 189 10/30/2014   TRIG 151.0 (H) 10/30/2014   HDL 41.30 10/30/2014   LDLDIRECT 178.0 09/24/2011   LDLCALC 118 (H) 10/30/2014   ALT 16 04/29/2016   AST 20 04/29/2016   NA 141 06/23/2016   K 4.0 06/23/2016   CL 109 06/23/2016   CREATININE 0.70 06/23/2016   BUN 9 06/23/2016   CO2 25 06/23/2016   TSH 1.991 02/01/2015   INR 1.7 10/18/2015    No results found.  Assessment & Plan:   There are no diagnoses linked to this encounter. I have discontinued Ms. Mccollom's azithromycin. I am also having her maintain her Cyanocobalamin (VITAMIN B-12 IJ), hydrocortisone, alum & mag hydroxide-simeth, Simethicone (GAS-X PO), denosumab, Rivaroxaban, ciclopirox, gabapentin, Wheat Dextrin (BENEFIBER PO), famotidine, clonazepam,  carvedilol, pregabalin, and ipratropium.  No orders of the defined types were placed in this encounter.    Follow-up: No Follow-up on file.  Walker Kehr, MD

## 2016-09-11 NOTE — Patient Instructions (Signed)
Try Carvedilol 1/2 tab twice a day We can try BYSTOLIC instead

## 2016-09-11 NOTE — Assessment & Plan Note (Signed)
Chronic pain - Gabapentin

## 2016-09-11 NOTE — Progress Notes (Signed)
Pre visit review using our clinic review tool, if applicable. No additional management support is needed unless otherwise documented below in the visit note. 

## 2016-09-11 NOTE — Assessment & Plan Note (Signed)
On Coreg bid - try 1/2 bid due to fatigue We can try BYSTOLIC instead

## 2016-10-24 ENCOUNTER — Ambulatory Visit (INDEPENDENT_AMBULATORY_CARE_PROVIDER_SITE_OTHER): Payer: Medicare Other | Admitting: Family

## 2016-10-24 ENCOUNTER — Encounter: Payer: Self-pay | Admitting: Family

## 2016-10-24 VITALS — BP 120/64 | HR 59 | Temp 97.7°F | Ht 63.0 in | Wt 134.0 lb

## 2016-10-24 DIAGNOSIS — J309 Allergic rhinitis, unspecified: Secondary | ICD-10-CM | POA: Diagnosis not present

## 2016-10-24 DIAGNOSIS — I480 Paroxysmal atrial fibrillation: Secondary | ICD-10-CM

## 2016-10-24 NOTE — Progress Notes (Signed)
Subjective:    Patient ID: Alyssa Luna, female    DOB: 10-12-1933, 83 y.o.   MRN: [id number]  CC: Alyssa Luna is a 83 y.o. female who presents today for an acute visit.    HPI: CC: sinus congestion, cough x one day, worsening.   Endorses chest tightness with cough, clear runny nasal congestion, sore throat ( resolved), . Tried claritin with some improvement.   No CP, fever, palpitations. Not taking atrovent nasal spray - stopped because of  Nasal bleeding.   H/o afib. Recently hospitalized for afib. On xarelto.  No lung disease or h/o PNA.     HISTORY:  Past Medical History:  Diagnosis Date  . AF (atrial fibrillation) ([place])   . Anxiety    has lorazepam on hand for nervousness, pt. reports that she had a break-in to her home on 05/2014  . Atrial fibrillation ([place])    D Taylor  . Breast cancer ([place]) 1984  . Cataract   . Colon polyps   . Coronary artery disease   . Diverticulosis   . Diverticulosis of colon   . Dysrhythmia    atrial fib  . Esophageal dysmotility   . Familial tremor   . GERD (gastroesophageal reflux disease)   . Hemorrhoids   . Hiatal hernia   . History of breast cancer   . History of hiatal hernia   . Hyperlipidemia   . Hypertension   . Internal hemorrhoids   . LBP (low back pain)   . Neuromuscular disorder ([place])    essential tremor  . Osteoarthritis    hands & back & knees   . Osteoporosis   . Primary localized osteoarthritis of left knee   . Renal cyst   . Schatzki's ring   . Scoliosis   . Stroke ([place])   . TIA (transient ischemic attack)   . TR (tricuspid regurgitation)    Mild  . Vitamin B12 deficiency   . Vitamin D deficiency    Past Surgical History:  Procedure Laterality Date  . AUGMENTATION MAMMAPLASTY    . bilateral mastectomy    . BREAST ENHANCEMENT SURGERY    . BREAST SURGERY     Bilateral mastectomy  . COLONOSCOPY    . EYE SURGERY     cataracts removed- /w iol  & blepheroplasty  . JOINT REPLACEMENT Right     . kytoplasy    . MASTECTOMY  1984   bilateral  . OOPHORECTOMY     BSO  . POLYPECTOMY    . TOTAL KNEE ARTHROPLASTY  Dec 2011   Right - Dr Noemi Chapel  . TOTAL KNEE ARTHROPLASTY Left 08/27/2014   dr Noemi Chapel  . TOTAL KNEE ARTHROPLASTY Left 08/27/2014   Procedure: LEFT TOTAL KNEE ARTHROPLASTY;  Surgeon: Lorn Junes, MD;  Location: [place];  Service: Orthopedics;  Laterality: Left;  [name] VAGINAL HYSTERECTOMY     LAVH BSO   Family History  Problem Relation Age of Onset  . Breast cancer Mother 65  . Tremor Mother   . Tremor Brother   . Colon cancer Maternal Aunt   . Ovarian cancer Maternal Grandmother   . Early death Neg Hx   . Stroke Neg Hx     Allergies: Pravastatin; Acetaminophen; Colchicine; Actonel [risedronate sodium]; Alendronate; Augmentin [amoxicillin-pot clavulanate]; Ciprofloxacin; Evista [raloxifene]; Flagyl [metronidazole]; Fosamax [alendronate sodium]; Gold-containing drug products; Lexapro [escitalopram oxalate]; Risedronate; Simvastatin; and Tramadol Current Outpatient Prescriptions on File Prior to Visit  Medication Sig Dispense Refill  . alum &  mag hydroxide-simeth (MAALOX/MYLANTA) 200-200-20 MG/5ML suspension Take 15 mLs by mouth every 6 (six) hours as needed for indigestion or heartburn.     . carvedilol (COREG) 3.125 MG tablet TAKE 1 TABLET BY MOUTH TWICE A DAY 180 tablet 4  . ciclopirox (PENLAC) 8 % solution Apply topically at bedtime. Apply over nail and surrounding skin. Apply daily over previous coat. After seven (7) days, may remove with alcohol and continue cycle. 6.6 mL 0  . clonazepam (KLONOPIN) 0.125 MG disintegrating tablet Take 1 tablet (0.125 mg total) by mouth 2 (two) times daily as needed for seizure. (Patient taking differently: Take 0.125 mg by mouth 2 (two) times daily as needed (for anxiety). ) 60 tablet 3  . Cyanocobalamin (VITAMIN B-12 IJ) Inject as directed every 30 (thirty) days.    [name] denosumab (PROLIA) 60 MG/ML SOLN injection Inject 60 mg into the skin  every 6 (six) months. Administer in upper arm, thigh, or abdomen Unable to tolerate oral meds Dx severe osteoporosis 1.8 mL 6  . famotidine (PEPCID) 20 MG tablet TAKE 1 TABLET (20 MG TOTAL) BY MOUTH 2 (TWO) TIMES DAILY. 60 tablet 3  . hydrocortisone 2.5 % cream Apply 1 application topically 3 (three) times daily as needed (itching).    [name] ipratropium (ATROVENT) 0.06 % nasal spray Place 2 sprays into both nostrils 4 (four) times daily. 15 mL 0  . Rivaroxaban (XARELTO) 15 MG TABS tablet Take 15 mg by mouth daily.     . Simethicone (GAS-X PO) Take 1 tablet by mouth daily as needed (flatulence). Reported on 09/01/2015    . Wheat Dextrin (BENEFIBER PO) Take by mouth. Mix 2 teaspoonsful in 6-8 ounces of water and drink once daily    . pregabalin (LYRICA) 50 MG capsule Take 1 capsule (50 mg total) by mouth 2 (two) times daily. (Patient not taking: Reported on 10/24/2016) 60 capsule 3   No current facility-administered medications on file prior to visit.     Social History  Substance Use Topics  . Smoking status: Never Smoker  . Smokeless tobacco: Never Used  . Alcohol use No    Review of Systems  Constitutional: Negative for chills and fever.  HENT: Positive for congestion and rhinorrhea. Negative for sinus pain, sinus pressure and sore throat.   Respiratory: Positive for cough and chest tightness. Negative for shortness of breath and wheezing.   Cardiovascular: Negative for chest pain and palpitations.  Gastrointestinal: Negative for nausea and vomiting.      Objective:    BP 120/64 (BP Location: Right Arm, Patient Position: Sitting, Cuff Size: Normal)   Pulse (!) 59   Temp 97.7 F (36.5 C) (Oral)   Ht 5' 3"  (1.6 m)   Wt 134 lb (60.8 kg)   SpO2 95%   BMI 23.74 kg/m    Physical Exam  Constitutional: She appears well-developed and well-nourished.  HENT:  Head: Normocephalic and atraumatic.  Right Ear: Hearing, tympanic membrane, external ear and ear canal normal. No drainage,  swelling or tenderness. No foreign bodies. Tympanic membrane is not erythematous and not bulging. No middle ear effusion. No decreased hearing is noted.  Left Ear: Hearing, tympanic membrane, external ear and ear canal normal. No drainage, swelling or tenderness. No foreign bodies. Tympanic membrane is not erythematous and not bulging.  No middle ear effusion. No decreased hearing is noted.  Nose: Nose normal. No rhinorrhea. Right sinus exhibits no maxillary sinus tenderness and no frontal sinus tenderness. Left sinus exhibits no maxillary sinus tenderness and  no frontal sinus tenderness.  Mouth/Throat: Uvula is midline, oropharynx is clear and moist and mucous membranes are normal. No oropharyngeal exudate, posterior oropharyngeal edema, posterior oropharyngeal erythema or tonsillar abscesses.  Eyes: Conjunctivae are normal.  Cardiovascular: Regular rhythm, normal heart sounds and normal pulses.   Pulmonary/Chest: Effort normal and breath sounds normal. She has no wheezes. She has no rhonchi. She has no rales.  Lymphadenopathy:       Head (right side): No submental, no submandibular, no tonsillar, no preauricular, no posterior auricular and no occipital adenopathy present.       Head (left side): No submental, no submandibular, no tonsillar, no preauricular, no posterior auricular and no occipital adenopathy present.    She has no cervical adenopathy.  Neurological: She is alert.  Skin: Skin is warm and dry.  Psychiatric: She has a normal mood and affect. Her speech is normal and behavior is normal. Thought content normal.  Vitals reviewed.      Assessment & Plan:   Problem List Items Addressed This Visit      Cardiovascular and Mediastinum   Atrial fibrillation ([place])    Clinically stable. Rate controlled.      Relevant Medications   amiodarone (PACERONE) 200 MG tablet (Start on 11/01/2016)     Respiratory   Allergic rhinitis - Primary    Afebrile. No acute distress. Offered  reassurance. Advised plain claritin and nasal saline spray alternating with atrovent. Return precautions given.            I am having Ms. Reber maintain her Cyanocobalamin (VITAMIN B-12 IJ), hydrocortisone, alum & mag hydroxide-simeth, Simethicone (GAS-X PO), denosumab, Rivaroxaban, ciclopirox, Wheat Dextrin (BENEFIBER PO), famotidine, clonazepam, carvedilol, pregabalin, ipratropium, and amiodarone.   Meds ordered this encounter  Medications  . amiodarone (PACERONE) 200 MG tablet    Sig: Take by mouth.    Return precautions given.   Risks, benefits, and alternatives of the medications and treatment plan prescribed today were discussed, and patient expressed understanding.   Education regarding symptom management and diagnosis given to patient on AVS.  Continue to follow with Walker Kehr, MD for routine health maintenance.   Karle Starch and I agreed with plan.   Mable Paris, FNP

## 2016-10-24 NOTE — Patient Instructions (Addendum)
Stay on  PLAIN claritin Try atrovent nasal spray every 3 rd day and use normal saline nasal spray twice per day in between Let us know if you are not better  '

## 2016-10-24 NOTE — Assessment & Plan Note (Signed)
Afebrile. No acute distress. Offered reassurance. Advised plain claritin and nasal saline spray alternating with atrovent. Return precautions given.

## 2016-10-24 NOTE — Progress Notes (Signed)
Pre visit review using our clinic review tool, if applicable. No additional management support is needed unless otherwise documented below in the visit note. 

## 2016-10-24 NOTE — Assessment & Plan Note (Signed)
Clinically stable. Rate controlled.

## 2016-11-04 ENCOUNTER — Encounter: Payer: Self-pay | Admitting: Internal Medicine

## 2016-11-04 ENCOUNTER — Other Ambulatory Visit (INDEPENDENT_AMBULATORY_CARE_PROVIDER_SITE_OTHER): Payer: Medicare Other

## 2016-11-04 ENCOUNTER — Ambulatory Visit (INDEPENDENT_AMBULATORY_CARE_PROVIDER_SITE_OTHER): Payer: Medicare Other | Admitting: Internal Medicine

## 2016-11-04 VITALS — BP 114/74 | HR 59 | Temp 97.5°F | Resp 16 | Ht 63.0 in | Wt 134.8 lb

## 2016-11-04 DIAGNOSIS — I480 Paroxysmal atrial fibrillation: Secondary | ICD-10-CM

## 2016-11-04 DIAGNOSIS — E559 Vitamin D deficiency, unspecified: Secondary | ICD-10-CM | POA: Diagnosis not present

## 2016-11-04 DIAGNOSIS — I10 Essential (primary) hypertension: Secondary | ICD-10-CM

## 2016-11-04 DIAGNOSIS — E538 Deficiency of other specified B group vitamins: Secondary | ICD-10-CM | POA: Diagnosis not present

## 2016-11-04 DIAGNOSIS — G459 Transient cerebral ischemic attack, unspecified: Secondary | ICD-10-CM | POA: Diagnosis not present

## 2016-11-04 DIAGNOSIS — M810 Age-related osteoporosis without current pathological fracture: Secondary | ICD-10-CM

## 2016-11-04 LAB — HEPATIC FUNCTION PANEL
ALBUMIN: 4 g/dL (ref 3.5–5.2)
ALK PHOS: 45 U/L (ref 39–117)
ALT: 14 U/L (ref 0–35)
AST: 21 U/L (ref 0–37)
Bilirubin, Direct: 0.1 mg/dL (ref 0.0–0.3)
Total Bilirubin: 0.5 mg/dL (ref 0.2–1.2)
Total Protein: 6.6 g/dL (ref 6.0–8.3)

## 2016-11-04 LAB — BASIC METABOLIC PANEL
BUN: 12 mg/dL (ref 6–23)
CO2: 27 meq/L (ref 19–32)
Calcium: 9.2 mg/dL (ref 8.4–10.5)
Chloride: 106 mEq/L (ref 96–112)
Creatinine, Ser: 0.8 mg/dL (ref 0.40–1.20)
GFR: 73.14 mL/min (ref >60.00)
GLUCOSE: 76 mg/dL (ref 70–99)
POTASSIUM: 4.2 meq/L (ref 3.5–5.1)
SODIUM: 138 meq/L (ref 135–145)

## 2016-11-04 LAB — TSH: TSH: 4.44 u[IU]/mL (ref 0.35–4.50)

## 2016-11-04 LAB — MAGNESIUM: Magnesium: 2.2 mg/dL (ref 1.5–2.5)

## 2016-11-04 MED ORDER — CYANOCOBALAMIN 1000 MCG/ML IJ SOLN
1000.0000 ug | Freq: Once | INTRAMUSCULAR | Status: AC
  Administered 2016-11-04: 1000 ug via INTRAMUSCULAR

## 2016-11-04 NOTE — Assessment & Plan Note (Signed)
On Amiodarone and Xarelto

## 2016-11-04 NOTE — Progress Notes (Signed)
Pre-visit discussion using our clinic review tool. No additional management support is needed unless otherwise documented below in the visit note.  

## 2016-11-04 NOTE — Assessment & Plan Note (Signed)
On Vit D 

## 2016-11-04 NOTE — Assessment & Plan Note (Signed)
NAS diet 

## 2016-11-04 NOTE — Progress Notes (Signed)
Subjective:  Patient ID: Alyssa Luna, female    DOB: 1934-04-12  Age: 83 y.o. MRN: [id number]  CC: Hospitalization Follow-up (Afib, )   HPI   Alyssa Luna presents for A fib, B12 def, GERD f/u The pt was hospitalized at [facility] x 2 d -- on Amiodarone now  Outpatient Medications Prior to Visit  Medication Sig Dispense Refill  . alum & mag hydroxide-simeth (MAALOX/MYLANTA) 200-200-20 MG/5ML suspension Take 15 mLs by mouth every 6 (six) hours as needed for indigestion or heartburn.     [name] amiodarone (PACERONE) 200 MG tablet Take by mouth.    . Cyanocobalamin (VITAMIN B-12 IJ) Inject as directed every 30 (thirty) days.    [name] denosumab (PROLIA) 60 MG/ML SOLN injection Inject 60 mg into the skin every 6 (six) months. Administer in upper arm, thigh, or abdomen Unable to tolerate oral meds Dx severe osteoporosis 1.8 mL 6  . famotidine (PEPCID) 20 MG tablet TAKE 1 TABLET (20 MG TOTAL) BY MOUTH 2 (TWO) TIMES DAILY. 60 tablet 3  . hydrocortisone 2.5 % cream Apply 1 application topically 3 (three) times daily as needed (itching).    [name] ipratropium (ATROVENT) 0.06 % nasal spray Place 2 sprays into both nostrils 4 (four) times daily. 15 mL 0  . Rivaroxaban (XARELTO) 15 MG TABS tablet Take 15 mg by mouth daily.     . Simethicone (GAS-X PO) Take 1 tablet by mouth daily as needed (flatulence). Reported on 09/01/2015    . Wheat Dextrin (BENEFIBER PO) Take by mouth. Mix 2 teaspoonsful in 6-8 ounces of water and drink once daily    . clonazepam (KLONOPIN) 0.125 MG disintegrating tablet Take 1 tablet (0.125 mg total) by mouth 2 (two) times daily as needed for seizure. (Patient taking differently: Take 0.125 mg by mouth 2 (two) times daily as needed (for anxiety). ) 60 tablet 3  . pregabalin (LYRICA) 50 MG capsule Take 1 capsule (50 mg total) by mouth 2 (two) times daily. 60 capsule 3  . ciclopirox (PENLAC) 8 % solution Apply topically at bedtime. Apply over nail and surrounding skin. Apply daily over  previous coat. After seven (7) days, may remove with alcohol and continue cycle. (Patient not taking: Reported on 11/04/2016) 6.6 mL 0  . carvedilol (COREG) 3.125 MG tablet TAKE 1 TABLET BY MOUTH TWICE A DAY 180 tablet 4   No facility-administered medications prior to visit.     ROS Review of Systems  Constitutional: Positive for fatigue. Negative for activity change, appetite change, chills and unexpected weight change.  HENT: Negative for congestion, mouth sores and sinus pressure.   Eyes: Negative for visual disturbance.  Respiratory: Negative for cough and chest tightness.   Gastrointestinal: Negative for abdominal pain and nausea.  Genitourinary: Negative for difficulty urinating, frequency and vaginal pain.  Musculoskeletal: Positive for gait problem. Negative for back pain.  Skin: Negative for pallor and rash.  Neurological: Negative for dizziness, tremors, weakness, numbness and headaches.  Psychiatric/Behavioral: Negative for confusion, sleep disturbance and suicidal ideas. The patient is not nervous/anxious.   less tired  Objective:  BP 114/74   Pulse (!) 59   Temp 97.5 F (36.4 C) (Oral)   Resp 16   Ht 5' 3"  (1.6 m)   Wt 134 lb 12 oz (61.1 kg)   SpO2 97%   BMI 23.87 kg/m   BP Readings from Last 3 Encounters:  11/04/16 114/74  10/24/16 120/64  09/11/16 140/72    Wt Readings from Last 3 Encounters:  11/04/16 134 lb 12 oz (61.1 kg)  10/24/16 134 lb (60.8 kg)  09/11/16 138 lb (62.6 kg)    Physical Exam  Constitutional: She appears well-developed. No distress.  HENT:  Head: Normocephalic.  Right Ear: External ear normal.  Left Ear: External ear normal.  Nose: Nose normal.  Mouth/Throat: Oropharynx is clear and moist.  Eyes: Conjunctivae are normal. Pupils are equal, round, and reactive to light. Right eye exhibits no discharge. Left eye exhibits no discharge.  Neck: Normal range of motion. Neck supple. No JVD present. No tracheal deviation present. No  thyromegaly present.  Cardiovascular: Normal rate, regular rhythm and normal heart sounds.   Pulmonary/Chest: No stridor. No respiratory distress. She has no wheezes.  Abdominal: Soft. Bowel sounds are normal. She exhibits no distension and no mass. There is no tenderness. There is no rebound and no guarding.  Musculoskeletal: She exhibits tenderness. She exhibits no edema.  Lymphadenopathy:    She has no cervical adenopathy.  Neurological: She displays normal reflexes. No cranial nerve deficit. She exhibits normal muscle tone. Coordination abnormal.  Skin: No rash noted. No erythema.  Psychiatric: She has a normal mood and affect. Her behavior is normal. Judgment and thought content normal.  walker S brady - RRR  Lab Results  Component Value Date   WBC 5.1 06/23/2016   HGB 12.9 06/23/2016   HCT 38.7 06/23/2016   PLT 214 06/23/2016   GLUCOSE 90 06/23/2016   CHOL 189 10/30/2014   TRIG 151.0 (H) 10/30/2014   HDL 41.30 10/30/2014   LDLDIRECT 178.0 09/24/2011   LDLCALC 118 (H) 10/30/2014   ALT 16 04/29/2016   AST 20 04/29/2016   NA 141 06/23/2016   K 4.0 06/23/2016   CL 109 06/23/2016   CREATININE 0.70 06/23/2016   BUN 9 06/23/2016   CO2 25 06/23/2016   TSH 1.991 02/01/2015   INR 1.7 10/18/2015    No results found.  Assessment & Plan:   Alyssa Luna was seen today for hospitalization follow-up.  Diagnoses and all orders for this visit:  Vitamin B 12 deficiency -     cyanocobalamin ((VITAMIN B-12)) injection 1,000 mcg; Inject 1 mL (1,000 mcg total) into the muscle once.   I have discontinued Alyssa Luna's clonazepam, carvedilol, and pregabalin. I am also having her maintain her Cyanocobalamin (VITAMIN B-12 IJ), hydrocortisone, alum & mag hydroxide-simeth, Simethicone (GAS-X PO), denosumab, Rivaroxaban, ciclopirox, Wheat Dextrin (BENEFIBER PO), famotidine, ipratropium, and amiodarone. We will continue to administer cyanocobalamin.  Meds ordered this encounter  Medications  .  cyanocobalamin ((VITAMIN B-12)) injection 1,000 mcg     Follow-up: No Follow-up on file.  Walker Kehr, MD

## 2016-11-04 NOTE — Assessment & Plan Note (Signed)
Xarelto

## 2016-11-04 NOTE — Assessment & Plan Note (Signed)
On Prolia

## 2016-11-09 ENCOUNTER — Ambulatory Visit: Payer: Medicare Other | Admitting: Internal Medicine

## 2016-11-23 ENCOUNTER — Telehealth: Payer: Self-pay

## 2016-11-23 ENCOUNTER — Ambulatory Visit: Payer: Medicare Other

## 2016-11-23 VITALS — BP 145/77

## 2016-11-23 DIAGNOSIS — I10 Essential (primary) hypertension: Secondary | ICD-10-CM

## 2016-11-23 NOTE — Telephone Encounter (Signed)
Routing to dr plotnikov----patient has fungus on 3 toes on the left foot and 2 toes on right foot-, should she make appt with you or go to podiatrist---she also has problems with fungal creams interacting with some of her medications, too----[please advise, I will call her back---can you send medication to pharm or do you want to see her?---ok to send message on mychart

## 2016-11-23 NOTE — Telephone Encounter (Signed)
Cream is ok - Use OTC Lamisil as directed Thx

## 2016-11-24 NOTE — Telephone Encounter (Signed)
Advised patient of dr plotnikovs note/instructions---patient repeated back for understanding

## 2016-12-08 ENCOUNTER — Encounter: Payer: Self-pay | Admitting: Internal Medicine

## 2016-12-08 ENCOUNTER — Ambulatory Visit (INDEPENDENT_AMBULATORY_CARE_PROVIDER_SITE_OTHER): Payer: Medicare Other | Admitting: Internal Medicine

## 2016-12-08 DIAGNOSIS — G8929 Other chronic pain: Secondary | ICD-10-CM

## 2016-12-08 DIAGNOSIS — I10 Essential (primary) hypertension: Secondary | ICD-10-CM

## 2016-12-08 DIAGNOSIS — I480 Paroxysmal atrial fibrillation: Secondary | ICD-10-CM | POA: Diagnosis not present

## 2016-12-08 DIAGNOSIS — G250 Essential tremor: Secondary | ICD-10-CM

## 2016-12-08 DIAGNOSIS — R110 Nausea: Secondary | ICD-10-CM

## 2016-12-08 DIAGNOSIS — K219 Gastro-esophageal reflux disease without esophagitis: Secondary | ICD-10-CM

## 2016-12-08 DIAGNOSIS — E538 Deficiency of other specified B group vitamins: Secondary | ICD-10-CM | POA: Diagnosis not present

## 2016-12-08 DIAGNOSIS — L600 Ingrowing nail: Secondary | ICD-10-CM

## 2016-12-08 DIAGNOSIS — M5440 Lumbago with sciatica, unspecified side: Secondary | ICD-10-CM

## 2016-12-08 MED ORDER — CYANOCOBALAMIN 1000 MCG/ML IJ SOLN
1000.0000 ug | Freq: Once | INTRAMUSCULAR | Status: AC
  Administered 2016-12-08: 1000 ug via INTRAMUSCULAR

## 2016-12-08 MED ORDER — GABAPENTIN 100 MG PO CAPS: ORAL_CAPSULE | ORAL | 5 refills | Status: DC

## 2016-12-08 NOTE — Progress Notes (Signed)
Pre visit review using our clinic review tool, if applicable. No additional management support is needed unless otherwise documented below in the visit note. 

## 2016-12-08 NOTE — Assessment & Plan Note (Signed)
F/u w/Dr Hilarie Fredrickson

## 2016-12-08 NOTE — Assessment & Plan Note (Signed)
B12 inj today

## 2016-12-08 NOTE — Assessment & Plan Note (Signed)
Pacerone, Xarelto

## 2016-12-08 NOTE — Assessment & Plan Note (Signed)
Podiatry ref

## 2016-12-08 NOTE — Assessment & Plan Note (Signed)
Chronic - f/u w/GI

## 2016-12-08 NOTE — Addendum Note (Signed)
Addended by: Karren Cobble on: 12/08/2016 01:42 PM   Modules accepted: Orders

## 2016-12-08 NOTE — Progress Notes (Signed)
Subjective:  Patient ID: Alyssa Luna, female    DOB: May 16, 1934  Age: 83 y.o. MRN: [id number]  CC: No chief complaint on file.   HPI CODA FILLER presents for LBP/neuropathic pain, A fib, B12 def f/u. C/o nausea, bloating.... not new  Outpatient Medications Prior to Visit  Medication Sig Dispense Refill  . alum & mag hydroxide-simeth (MAALOX/MYLANTA) 200-200-20 MG/5ML suspension Take 15 mLs by mouth every 6 (six) hours as needed for indigestion or heartburn.     [name] amiodarone (PACERONE) 200 MG tablet Take by mouth.    . Cyanocobalamin (VITAMIN B-12 IJ) Inject as directed every 30 (thirty) days.    [name] denosumab (PROLIA) 60 MG/ML SOLN injection Inject 60 mg into the skin every 6 (six) months. Administer in upper arm, thigh, or abdomen Unable to tolerate oral meds Dx severe osteoporosis 1.8 mL 6  . famotidine (PEPCID) 20 MG tablet TAKE 1 TABLET (20 MG TOTAL) BY MOUTH 2 (TWO) TIMES DAILY. 60 tablet 3  . hydrocortisone 2.5 % cream Apply 1 application topically 3 (three) times daily as needed (itching).    [name] ipratropium (ATROVENT) 0.06 % nasal spray Place 2 sprays into both nostrils 4 (four) times daily. 15 mL 0  . Rivaroxaban (XARELTO) 15 MG TABS tablet Take 15 mg by mouth daily.     . Simethicone (GAS-X PO) Take 1 tablet by mouth daily as needed (flatulence). Reported on 09/01/2015    . Wheat Dextrin (BENEFIBER PO) Take by mouth. Mix 2 teaspoonsful in 6-8 ounces of water and drink once daily    . ciclopirox (PENLAC) 8 % solution Apply topically at bedtime. Apply over nail and surrounding skin. Apply daily over previous coat. After seven (7) days, may remove with alcohol and continue cycle. (Patient not taking: Reported on 11/04/2016) 6.6 mL 0   No facility-administered medications prior to visit.     ROS Review of Systems  Constitutional: Positive for fatigue. Negative for activity change, appetite change, chills and unexpected weight change.  HENT: Negative for congestion, mouth  sores and sinus pressure.   Eyes: Negative for visual disturbance.  Respiratory: Negative for cough and chest tightness.   Cardiovascular: Positive for leg swelling.  Gastrointestinal: Negative for abdominal pain and nausea.  Genitourinary: Negative for difficulty urinating, frequency and vaginal pain.  Musculoskeletal: Positive for arthralgias. Negative for back pain and gait problem.  Skin: Negative for pallor and rash.  Neurological: Negative for dizziness, tremors, weakness, numbness and headaches.  Psychiatric/Behavioral: Negative for confusion, sleep disturbance and suicidal ideas.    Objective:  BP (!) 142/80 (BP Location: Right Arm, Patient Position: Sitting, Cuff Size: Normal)   Pulse (!) 57   Temp 97.7 F (36.5 C) (Oral)   Ht 5' 3"  (1.6 m)   Wt 138 lb 1.3 oz (62.6 kg)   SpO2 97%   BMI 24.46 kg/m   BP Readings from Last 3 Encounters:  12/08/16 (!) 142/80  11/23/16 (!) 145/77  11/04/16 114/74    Wt Readings from Last 3 Encounters:  12/08/16 138 lb 1.3 oz (62.6 kg)  11/04/16 134 lb 12 oz (61.1 kg)  10/24/16 134 lb (60.8 kg)    Physical Exam  Constitutional: She appears well-developed. No distress.  HENT:  Head: Normocephalic.  Right Ear: External ear normal.  Left Ear: External ear normal.  Nose: Nose normal.  Mouth/Throat: Oropharynx is clear and moist.  Eyes: Conjunctivae are normal. Pupils are equal, round, and reactive to light. Right eye exhibits no discharge. Left eye  exhibits no discharge.  Neck: Normal range of motion. Neck supple. No JVD present. No tracheal deviation present. No thyromegaly present.  Cardiovascular: Normal rate, regular rhythm and normal heart sounds.   Pulmonary/Chest: No stridor. No respiratory distress. She has no wheezes.  Abdominal: Soft. Bowel sounds are normal. She exhibits no distension and no mass. There is no tenderness. There is no rebound and no guarding.  Musculoskeletal: She exhibits no edema or tenderness.    Lymphadenopathy:    She has no cervical adenopathy.  Neurological: She displays normal reflexes. No cranial nerve deficit. She exhibits normal muscle tone. Coordination abnormal.  Skin: No rash noted. No erythema.  Psychiatric: She has a normal mood and affect. Her behavior is normal. Judgment and thought content normal.  mild tremor R foot ingrown toenail  Lab Results  Component Value Date   WBC 5.1 06/23/2016   HGB 12.9 06/23/2016   HCT 38.7 06/23/2016   PLT 214 06/23/2016   GLUCOSE 76 11/04/2016   CHOL 189 10/30/2014   TRIG 151.0 (H) 10/30/2014   HDL 41.30 10/30/2014   LDLDIRECT 178.0 09/24/2011   LDLCALC 118 (H) 10/30/2014   ALT 14 11/04/2016   AST 21 11/04/2016   NA 138 11/04/2016   K 4.2 11/04/2016   CL 106 11/04/2016   CREATININE 0.80 11/04/2016   BUN 12 11/04/2016   CO2 27 11/04/2016   TSH 4.44 11/04/2016   INR 1.7 10/18/2015    No results found.  Assessment & Plan:   There are no diagnoses linked to this encounter. I am having Ms. Furuta maintain her Cyanocobalamin (VITAMIN B-12 IJ), hydrocortisone, alum & mag hydroxide-simeth, Simethicone (GAS-X PO), denosumab, Rivaroxaban, ciclopirox, Wheat Dextrin (BENEFIBER PO), famotidine, ipratropium, and amiodarone.  No orders of the defined types were placed in this encounter.    Follow-up: No Follow-up on file.  Walker Kehr, MD

## 2016-12-08 NOTE — Assessment & Plan Note (Signed)
We are watching  Diazepam prn

## 2016-12-08 NOTE — Assessment & Plan Note (Signed)
On Gabapentin

## 2016-12-08 NOTE — Assessment & Plan Note (Signed)
Off Rx 

## 2016-12-31 ENCOUNTER — Ambulatory Visit: Payer: Medicare Other | Admitting: Podiatry

## 2017-01-04 ENCOUNTER — Ambulatory Visit (INDEPENDENT_AMBULATORY_CARE_PROVIDER_SITE_OTHER): Payer: Medicare Other | Admitting: Podiatry

## 2017-01-04 ENCOUNTER — Encounter: Payer: Self-pay | Admitting: Podiatry

## 2017-01-04 DIAGNOSIS — B351 Tinea unguium: Secondary | ICD-10-CM

## 2017-01-04 DIAGNOSIS — M79604 Pain in right leg: Secondary | ICD-10-CM | POA: Diagnosis not present

## 2017-01-04 DIAGNOSIS — L600 Ingrowing nail: Secondary | ICD-10-CM

## 2017-01-04 DIAGNOSIS — M79605 Pain in left leg: Secondary | ICD-10-CM

## 2017-01-04 NOTE — Patient Instructions (Addendum)

## 2017-01-06 NOTE — Progress Notes (Signed)
Subjective:    Patient ID: Alyssa Luna, female   DOB: 83 y.o.   MRN: [id number]   HPI patient presents with a painful ingrown toenail the left big toe and also nail disease 1 through 5 both feet that are thick she cannot cut and it becomes painful    Review of Systems  All other systems reviewed and are negative.       Objective:  Physical Exam  Constitutional: She is oriented to person, place, and time.  Cardiovascular: Intact distal pulses.   Musculoskeletal: Normal range of motion.  Neurological: She is alert and oriented to person, place, and time.  Skin: Skin is warm and dry.  Nursing note and vitals reviewed.  neurovascular status was found to be intact with muscle strength adequate. Patient's found to have incurvation of the left hallux medial border that's painful when pressed with slight distal redness and no active drainage. All nails are thick and yellow brittle and painful when pressed dorsally and impossible for her to cut     Assessment:   Ingrown toenail deformity left hallux medial border and mycotic nail infection with pain 1-5 both feet      Plan:    H&P and both conditions discussed. I've recommended removal of the nail border and explained risk of procedure. Today I infiltrated the left hallux 60 Milligan times like Marcaine mixture remove the border exposed matrix and applied phenol 3 applications 30 seconds followed by alcohol lavaged sterile dressing. I then debrided all nails on both feet with no iatrogenic bleeding noted

## 2017-01-13 ENCOUNTER — Ambulatory Visit (INDEPENDENT_AMBULATORY_CARE_PROVIDER_SITE_OTHER): Payer: Medicare Other

## 2017-01-13 DIAGNOSIS — E538 Deficiency of other specified B group vitamins: Secondary | ICD-10-CM | POA: Diagnosis not present

## 2017-01-13 MED ORDER — CYANOCOBALAMIN 1000 MCG/ML IJ SOLN
1000.0000 ug | Freq: Once | INTRAMUSCULAR | Status: AC
  Administered 2017-01-13: 1000 ug via INTRAMUSCULAR

## 2017-01-13 NOTE — Progress Notes (Addendum)
Spoke with Dr.Plot and he wants her to continue to get b12 injections, stated its a chronic issue that it is fine that she continues to get injections  I was available to supervise the injection. Purnell Shoemaker, MD

## 2017-01-24 ENCOUNTER — Other Ambulatory Visit: Payer: Self-pay | Admitting: Internal Medicine

## 2017-01-27 ENCOUNTER — Other Ambulatory Visit (INDEPENDENT_AMBULATORY_CARE_PROVIDER_SITE_OTHER): Payer: Medicare Other

## 2017-01-27 ENCOUNTER — Ambulatory Visit (INDEPENDENT_AMBULATORY_CARE_PROVIDER_SITE_OTHER): Payer: Medicare Other | Admitting: Internal Medicine

## 2017-01-27 ENCOUNTER — Encounter: Payer: Self-pay | Admitting: Internal Medicine

## 2017-01-27 VITALS — BP 140/80 | HR 67 | Ht 63.0 in | Wt 136.0 lb

## 2017-01-27 DIAGNOSIS — W57XXXA Bitten or stung by nonvenomous insect and other nonvenomous arthropods, initial encounter: Secondary | ICD-10-CM | POA: Insufficient documentation

## 2017-01-27 DIAGNOSIS — F411 Generalized anxiety disorder: Secondary | ICD-10-CM

## 2017-01-27 DIAGNOSIS — Z91018 Allergy to other foods: Secondary | ICD-10-CM

## 2017-01-27 DIAGNOSIS — S70261A Insect bite (nonvenomous), right hip, initial encounter: Secondary | ICD-10-CM

## 2017-01-27 DIAGNOSIS — R0600 Dyspnea, unspecified: Secondary | ICD-10-CM

## 2017-01-27 LAB — CBC WITH DIFFERENTIAL/PLATELET
Basophils Absolute: 0.1 10*3/uL (ref 0.0–0.1)
Basophils Relative: 1.7 % (ref 0.0–3.0)
Eosinophils Absolute: 0.1 10*3/uL (ref 0.0–0.7)
Eosinophils Relative: 2 % (ref 0.0–5.0)
HCT: 39.5 % (ref 36.0–46.0)
Hemoglobin: 12.9 g/dL (ref 12.0–15.0)
Lymphocytes Relative: 39.9 % (ref 12.0–46.0)
Lymphs Abs: 1.6 10*3/uL (ref 0.7–4.0)
MCHC: 32.8 g/dL (ref 30.0–36.0)
MCV: 86.7 fl (ref 78.0–100.0)
Monocytes Absolute: 0.5 10*3/uL (ref 0.1–1.0)
Monocytes Relative: 11.5 % (ref 3.0–12.0)
Neutro Abs: 1.8 10*3/uL (ref 1.4–7.7)
Neutrophils Relative %: 44.9 % (ref 43.0–77.0)
Platelets: 260 10*3/uL (ref 150.0–400.0)
RBC: 4.55 Mil/uL (ref 3.87–5.11)
RDW: 14 % (ref 11.5–15.5)
WBC: 4 10*3/uL (ref 4.0–10.5)

## 2017-01-27 LAB — BASIC METABOLIC PANEL
BUN: 11 mg/dL (ref 6–23)
CHLORIDE: 106 meq/L (ref 96–112)
CO2: 29 meq/L (ref 19–32)
CREATININE: 0.85 mg/dL (ref 0.40–1.20)
Calcium: 9.5 mg/dL (ref 8.4–10.5)
GFR: 68.16 mL/min (ref >60.00)
Glucose, Bld: 91 mg/dL (ref 70–99)
POTASSIUM: 4.8 meq/L (ref 3.5–5.1)
Sodium: 140 mEq/L (ref 135–145)

## 2017-01-27 LAB — HEPATIC FUNCTION PANEL
ALT: 21 U/L (ref 0–35)
AST: 28 U/L (ref 0–37)
Albumin: 4.2 g/dL (ref 3.5–5.2)
Alkaline Phosphatase: 54 U/L (ref 39–117)
BILIRUBIN DIRECT: 0.1 mg/dL (ref 0.0–0.3)
TOTAL PROTEIN: 7 g/dL (ref 6.0–8.3)
Total Bilirubin: 0.8 mg/dL (ref 0.2–1.2)

## 2017-01-27 NOTE — Patient Instructions (Signed)
Please continue all other medications as before, and refills have been done if requested.  Please have the pharmacy call with any other refills you may need.  Please keep your appointments with your specialists as you may have planned  Please go to the LAB in the Basement (turn left off the elevator) for the tests to be done today - to check for tick bite related disease, and food allergies, as well as kidney, liver and Blood counts  You will be contacted by phone if any changes need to be made immediately.  Otherwise, you will receive a letter about your results with an explanation, but please check with MyChart first.  Please remember to sign up for MyChart if you have not done so, as this will be important to you in the future with finding out test results, communicating by private email, and scheduling acute appointments online when needed.  We can refer to allergy if you like at any time.
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
## 2017-01-27 NOTE — Assessment & Plan Note (Signed)
I see no evidence of angioedema, rash or other allergy related or hypersensitivity issue though she is adamant about this and very nervous; I tried to reassure her that a newspaper article mentioning new food allergy is due to tick bite is likely not correct, and should not be concern for further evaluation or tx, though I did order food allergy panel

## 2017-01-27 NOTE — Assessment & Plan Note (Signed)
Quite significant today situational most likely, ok to cont same tx but f/u with PCP if concerns continue

## 2017-01-27 NOTE — Assessment & Plan Note (Addendum)
Exam benign , ok for serologies but would hold on antibx tx at this time as afeb and benign exam

## 2017-01-27 NOTE — Assessment & Plan Note (Addendum)
Exam benign, sat ok, does have some kyphosis thoracic, suspect some restrictive disease, o/w no evidence for pna, chf or PE; it is doubtful cxr would be helpful, I would watch with expectant management

## 2017-02-01 ENCOUNTER — Encounter: Payer: Self-pay | Admitting: Internal Medicine

## 2017-02-01 ENCOUNTER — Ambulatory Visit (INDEPENDENT_AMBULATORY_CARE_PROVIDER_SITE_OTHER): Payer: Medicare Other | Admitting: Internal Medicine

## 2017-02-01 VITALS — BP 130/64 | HR 60 | Ht 61.25 in | Wt 139.0 lb

## 2017-02-01 DIAGNOSIS — E739 Lactose intolerance, unspecified: Secondary | ICD-10-CM | POA: Diagnosis not present

## 2017-02-01 DIAGNOSIS — K5900 Constipation, unspecified: Secondary | ICD-10-CM

## 2017-02-01 DIAGNOSIS — K219 Gastro-esophageal reflux disease without esophagitis: Secondary | ICD-10-CM

## 2017-02-01 DIAGNOSIS — W57XXXS Bitten or stung by nonvenomous insect and other nonvenomous arthropods, sequela: Secondary | ICD-10-CM | POA: Diagnosis not present

## 2017-02-01 MED ORDER — FAMOTIDINE 20 MG PO TABS: 20.0000 mg | ORAL_TABLET | Freq: Three times a day (TID) | ORAL | 5 refills | Status: DC

## 2017-02-01 NOTE — Progress Notes (Signed)
Subjective:    Patient ID: Alyssa Luna, female    DOB: 29-Nov-1933, 83 y.o.   MRN: [id number]  HPI Alyssa Luna is an 83 year old female with a history of GERD, hiatal hernia, esophageal dysmotility, severe colonic diverticulosis, atrial fibrillation on Xarelto who is here for follow-up. She is here alone today and was last seen on 04/17/2016.  She reports that she has been doing fairly well though she was bitten by a tick in late May 2018. She identified this as a [place] tick by the white dot on the back of the tick. Since this time she has been concerned because she read an article about meat allergy developing after bite from the [facility] Star tick due to exposure to alpha-gal.  For this reason she has made an appointment with [facility] allergy and immunology scheduled for 05/01/2017. Since this time she has avoided all meat products.  She also stopped lactose and all milk products and her abdominal bloating has improved dramatically.  She is not having heartburn but has noticed some throat clearing as well as hoarseness. She is using Pepcid 20 mg twice a day. She denies dysphagia. Denies abdominal pain. Bowel habits have been regular on her Benefiber.   Review of Systems As per history of present illness, otherwise negative  Current Medications, Allergies, Past Medical History, Past Surgical History, Family History and Social History were reviewed in [employer] record.     Objective:   Physical Exam BP 130/64 (BP Location: Left Arm, Patient Position: Sitting, Cuff Size: Normal)   Pulse 60   Ht 5' 1.25" (1.556 m) Comment: height measured without shoes  Wt 139 lb (63 kg)   BMI 26.05 kg/m  Constitutional: Well-developed and well-nourished. No distress. HEENT: Normocephalic and atraumatic.  Conjunctivae are normal.  No scleral icterus. Neck: Neck supple. Trachea midline. Cardiovascular: Normal rate, regular rhythm and intact distal pulses.  Pulmonary/chest:  Effort normal and breath sounds normal. No wheezing, rales or rhonchi. Abdominal: Soft, nontender, nondistended. Bowel sounds active throughout.  Extremities: no clubbing, cyanosis, or edema Neurological: Alert and oriented to person place and time. Skin: Skin is warm and dry. Psychiatric: Normal mood and affect. Behavior is normal.  CBC    Component Value Date/Time   WBC 4.0 01/27/2017 1153   RBC 4.55 01/27/2017 1153   HGB 12.9 01/27/2017 1153   HCT 39.5 01/27/2017 1153   PLT 260.0 01/27/2017 1153   MCV 86.7 01/27/2017 1153   MCH 29.0 06/23/2016 1535   MCHC 32.8 01/27/2017 1153   RDW 14.0 01/27/2017 1153   LYMPHSABS 1.6 01/27/2017 1153   MONOABS 0.5 01/27/2017 1153   EOSABS 0.1 01/27/2017 1153   BASOSABS 0.1 01/27/2017 1153   CMP     Component Value Date/Time   NA 140 01/27/2017 1153   K 4.8 01/27/2017 1153   CL 106 01/27/2017 1153   CO2 29 01/27/2017 1153   GLUCOSE 91 01/27/2017 1153   BUN 11 01/27/2017 1153   CREATININE 0.85 01/27/2017 1153   CALCIUM 9.5 01/27/2017 1153   PROT 7.0 01/27/2017 1153   ALBUMIN 4.2 01/27/2017 1153   AST 28 01/27/2017 1153   ALT 21 01/27/2017 1153   ALKPHOS 54 01/27/2017 1153   BILITOT 0.8 01/27/2017 1153   GFRNONAA >60 06/23/2016 1535   GFRAA >60 06/23/2016 1535       Assessment & Plan:  83 year old female with a history of GERD, hiatal hernia, esophageal dysmotility, severe colonic diverticulosis, atrial fibrillation on Xarelto who  is here for follow-up.  1. GERD/LPR -- some hoarseness and throat clearing symptom could be secondary to uncontrolled reflux. Postnasal drip also possible. Trial of increased Pepcid 20 mg 3 times a day. We have avoided PPI due to relative inefficacy and risk given her osteoporosis. Continue GERD precautions. If not improving after a month and hoarseness persists consider ENT referral.  2. Abdominal bloating -- improved with lactose avoidance. Suspect element of lactose intolerance. I advise she continue to  avoid lactose products.  3. Left-sided abdominal pain/mild constipation -- improved completely with Benefiber. Continue Benefiber daily.  4. [place] tick bite -- we discussed that meat allergy is possible due to alpha-gal though overall rare. She will be seen at [facility] allergy and immunology to this regard. No evidence of tick borne illness present  25 minutes spent with the patient today. Greater than 50% was spent in counseling and coordination of care with the patient

## 2017-02-01 NOTE — Patient Instructions (Signed)
We have sent the following medications to your pharmacy for you to pick up at your convenience: Pepcid three times daily  Continue Benefiber  Avoid lactose  Follow up with Dr Hilarie Fredrickson in 6 months.  If you are age 81 or older, your body mass index should be between 23-30. Your Body mass index is 26.05 kg/m. If this is out of the aforementioned range listed, please consider follow up with your Primary Care Provider.  If you are age 6 or younger, your body mass index should be between 19-25. Your Body mass index is 26.05 kg/m. If this is out of the aformentioned range listed, please consider follow up with your Primary Care Provider.

## 2017-02-10 ENCOUNTER — Ambulatory Visit: Payer: Medicare Other | Admitting: Internal Medicine

## 2017-02-23 ENCOUNTER — Other Ambulatory Visit: Payer: Self-pay | Admitting: Internal Medicine

## 2017-02-23 ENCOUNTER — Telehealth: Payer: Self-pay | Admitting: General Practice

## 2017-02-23 NOTE — Telephone Encounter (Signed)
Patient has appointment at [facility] in regards to tick bite in September.  Pt wants to wait and get Prolia injection after that appointment.

## 2017-02-26 ENCOUNTER — Encounter: Payer: Self-pay | Admitting: Internal Medicine

## 2017-02-26 ENCOUNTER — Ambulatory Visit (INDEPENDENT_AMBULATORY_CARE_PROVIDER_SITE_OTHER): Payer: Medicare Other | Admitting: Internal Medicine

## 2017-02-26 DIAGNOSIS — W57XXXS Bitten or stung by nonvenomous insect and other nonvenomous arthropods, sequela: Secondary | ICD-10-CM | POA: Diagnosis not present

## 2017-02-26 DIAGNOSIS — Z91018 Allergy to other foods: Secondary | ICD-10-CM | POA: Diagnosis not present

## 2017-02-26 DIAGNOSIS — M810 Age-related osteoporosis without current pathological fracture: Secondary | ICD-10-CM | POA: Diagnosis not present

## 2017-02-26 DIAGNOSIS — M2550 Pain in unspecified joint: Secondary | ICD-10-CM | POA: Insufficient documentation

## 2017-02-26 DIAGNOSIS — E538 Deficiency of other specified B group vitamins: Secondary | ICD-10-CM

## 2017-02-26 MED ORDER — CYANOCOBALAMIN 1000 MCG/ML IJ SOLN
1000.0000 ug | Freq: Once | INTRAMUSCULAR | Status: AC
  Administered 2017-02-26: 1000 ug via INTRAMUSCULAR

## 2017-02-26 NOTE — Assessment & Plan Note (Addendum)
a [place] tick bite x several times and is worried about beef allergy now; she made an appt w/Dr Quenton Fetter at [facility] Her blood test IgE (+) beef, milk. On a diet now

## 2017-02-26 NOTE — Assessment & Plan Note (Addendum)
7/18 a [place] star tick bite x several times and is worried about beef allergy now; she made an appt w/Dr Jacob Moores at [facility]. Her blood test IgE (+) beef, milk. Vegeterian diet, no milk

## 2017-02-26 NOTE — Assessment & Plan Note (Addendum)
7/18 a [place] star tick bite x several times and is worried about beef allergy now; she made an appt w/Dr Jacob Moores at [facility]. Her blood test IgE (+) beef, milk.  Diet discussed

## 2017-02-26 NOTE — Progress Notes (Signed)
Subjective:  Patient ID: Alyssa Luna, female    DOB: 1934/05/13  Age: 83 y.o. MRN: [id number]  CC: No chief complaint on file.   HPI Alyssa Luna presents for a [place] star tick bite x several times and is worried about beef allergy now; she made an appt w/Dr Nicki Reaper Commins at [facility]. Her blood test IgE (+) beef, milk.  C/o arthralgias, B12 def, nausea f/u  Outpatient Medications Prior to Visit  Medication Sig Dispense Refill  . alum & mag hydroxide-simeth (MAALOX/MYLANTA) 200-200-20 MG/5ML suspension Take 15 mLs by mouth every 6 (six) hours as needed for indigestion or heartburn.     [name] amiodarone (PACERONE) 200 MG tablet Take by mouth.    . ciclopirox (PENLAC) 8 % solution Apply topically at bedtime. Apply over nail and surrounding skin. Apply daily over previous coat. After seven (7) days, may remove with alcohol and continue cycle. 6.6 mL 0  . Cyanocobalamin (VITAMIN B-12 IJ) Inject as directed every 30 (thirty) days.    [name] denosumab (PROLIA) 60 MG/ML SOLN injection Inject 60 mg into the skin every 6 (six) months. Administer in upper arm, thigh, or abdomen Unable to tolerate oral meds Dx severe osteoporosis 1.8 mL 6  . famotidine (PEPCID) 20 MG tablet Take 1 tablet (20 mg total) by mouth 3 (three) times daily. 90 tablet 5  . gabapentin (NEURONTIN) 100 MG capsule Take 1 to 2 capsules by mouth 4 times daily 180 capsule 5  . hydrocortisone 2.5 % cream Apply 1 application topically 3 (three) times daily as needed (itching).    . Rivaroxaban (XARELTO) 15 MG TABS tablet Take 15 mg by mouth daily.     . Simethicone (GAS-X PO) Take 1 tablet by mouth daily as needed (flatulence). Reported on 09/01/2015    . Wheat Dextrin (BENEFIBER PO) Take by mouth. Mix 2 teaspoonsful in 6-8 ounces of water and drink once daily     No facility-administered medications prior to visit.     ROS Review of Systems  Constitutional: Negative for activity change, appetite change, chills, fatigue and unexpected  weight change.  HENT: Negative for congestion, mouth sores and sinus pressure.   Eyes: Negative for visual disturbance.  Respiratory: Negative for cough and chest tightness.   Gastrointestinal: Negative for abdominal pain and nausea.  Genitourinary: Negative for difficulty urinating, frequency and vaginal pain.  Musculoskeletal: Positive for arthralgias and back pain. Negative for gait problem.  Skin: Negative for pallor and rash.  Neurological: Negative for dizziness, tremors, weakness, numbness and headaches.  Psychiatric/Behavioral: Negative for confusion and sleep disturbance.    Objective:  BP 128/68 (BP Location: Left Arm, Patient Position: Sitting, Cuff Size: Normal)   Pulse 60   Temp 98 F (36.7 C) (Oral)   Ht 5' 3"  (1.6 m)   Wt 136 lb (61.7 kg)   SpO2 98%   BMI 24.09 kg/m   BP Readings from Last 3 Encounters:  02/26/17 128/68  02/01/17 130/64  01/27/17 140/80    Wt Readings from Last 3 Encounters:  02/26/17 136 lb (61.7 kg)  02/01/17 139 lb (63 kg)  01/27/17 136 lb (61.7 kg)    Physical Exam  Constitutional: She appears well-developed. No distress.  HENT:  Head: Normocephalic.  Right Ear: External ear normal.  Left Ear: External ear normal.  Nose: Nose normal.  Mouth/Throat: Oropharynx is clear and moist.  Eyes: Pupils are equal, round, and reactive to light. Conjunctivae are normal. Right eye exhibits no discharge. Left eye exhibits  no discharge.  Neck: Normal range of motion. Neck supple. No JVD present. No tracheal deviation present. No thyromegaly present.  Cardiovascular: Normal rate, regular rhythm and normal heart sounds.   Pulmonary/Chest: No stridor. No respiratory distress. She has no wheezes.  Abdominal: Soft. Bowel sounds are normal. She exhibits no distension and no mass. There is no tenderness. There is no rebound and no guarding.  Musculoskeletal: She exhibits tenderness. She exhibits no edema.  Lymphadenopathy:    She has no cervical  adenopathy.  Neurological: She displays normal reflexes. No cranial nerve deficit. She exhibits normal muscle tone. Coordination normal.  Skin: No rash noted. No erythema.  Psychiatric: She has a normal mood and affect. Her behavior is normal. Judgment and thought content normal.    Lab Results  Component Value Date   WBC 4.0 01/27/2017   HGB 12.9 01/27/2017   HCT 39.5 01/27/2017   PLT 260.0 01/27/2017   GLUCOSE 91 01/27/2017   CHOL 189 10/30/2014   TRIG 151.0 (H) 10/30/2014   HDL 41.30 10/30/2014   LDLDIRECT 178.0 09/24/2011   LDLCALC 118 (H) 10/30/2014   ALT 21 01/27/2017   AST 28 01/27/2017   NA 140 01/27/2017   K 4.8 01/27/2017   CL 106 01/27/2017   CREATININE 0.85 01/27/2017   BUN 11 01/27/2017   CO2 29 01/27/2017   TSH 4.44 11/04/2016   INR 1.7 10/18/2015    No results found.  Assessment & Plan:   There are no diagnoses linked to this encounter. I am having Ms. Vasek maintain her Cyanocobalamin (VITAMIN B-12 IJ), hydrocortisone, alum & mag hydroxide-simeth, Simethicone (GAS-X PO), denosumab, Rivaroxaban, ciclopirox, Wheat Dextrin (BENEFIBER PO), amiodarone, gabapentin, and famotidine.  No orders of the defined types were placed in this encounter.    Follow-up: No Follow-up on file.  Walker Kehr, MD

## 2017-02-26 NOTE — Patient Instructions (Signed)
Vegeterian diet, no milk

## 2017-02-26 NOTE — Assessment & Plan Note (Signed)
Prolia, Vit d

## 2017-02-26 NOTE — Assessment & Plan Note (Signed)
On B12 

## 2017-02-26 NOTE — Addendum Note (Signed)
Addended by: Karren Cobble on: 02/26/2017 08:34 AM   Modules accepted: Orders

## 2017-03-03 ENCOUNTER — Telehealth: Payer: Self-pay

## 2017-03-03 NOTE — Telephone Encounter (Signed)
Patient wants to complete seeing a doctor at [place] (prob close to sept/2018)before she gets next injection

## 2017-03-05 ENCOUNTER — Telehealth: Payer: Self-pay | Admitting: Internal Medicine

## 2017-03-05 ENCOUNTER — Ambulatory Visit: Payer: Medicare Other | Admitting: Internal Medicine

## 2017-03-05 NOTE — Telephone Encounter (Signed)
Patient still having problems with hoarseness, do you have someone specific in mind to refer her to in ENT?

## 2017-03-08 ENCOUNTER — Other Ambulatory Visit: Payer: Self-pay

## 2017-03-08 NOTE — Telephone Encounter (Signed)
Left message for patient that we will make a referral to [facility] for continued hoarseness, information faxed to: [phone].

## 2017-03-08 NOTE — Telephone Encounter (Signed)
Dr. Lucia Gaskins  Or  [facility] ENT

## 2017-03-09 ENCOUNTER — Ambulatory Visit: Payer: Medicare Other | Admitting: Internal Medicine

## 2017-03-17 DIAGNOSIS — R490 Dysphonia: Secondary | ICD-10-CM | POA: Insufficient documentation

## 2017-03-25 ENCOUNTER — Ambulatory Visit (INDEPENDENT_AMBULATORY_CARE_PROVIDER_SITE_OTHER): Payer: Medicare Other | Admitting: Internal Medicine

## 2017-03-25 ENCOUNTER — Encounter: Payer: Self-pay | Admitting: Internal Medicine

## 2017-03-25 DIAGNOSIS — M5416 Radiculopathy, lumbar region: Secondary | ICD-10-CM | POA: Diagnosis not present

## 2017-03-25 DIAGNOSIS — M810 Age-related osteoporosis without current pathological fracture: Secondary | ICD-10-CM | POA: Diagnosis not present

## 2017-03-25 DIAGNOSIS — I480 Paroxysmal atrial fibrillation: Secondary | ICD-10-CM | POA: Diagnosis not present

## 2017-03-25 DIAGNOSIS — F411 Generalized anxiety disorder: Secondary | ICD-10-CM | POA: Diagnosis not present

## 2017-03-25 DIAGNOSIS — E538 Deficiency of other specified B group vitamins: Secondary | ICD-10-CM | POA: Diagnosis not present

## 2017-03-25 MED ORDER — PREGABALIN 25 MG PO CAPS: 25.0000 mg | ORAL_CAPSULE | Freq: Two times a day (BID) | ORAL | 5 refills | Status: DC

## 2017-03-25 MED ORDER — PREGABALIN 50 MG PO CAPS: 50.0000 mg | ORAL_CAPSULE | Freq: Two times a day (BID) | ORAL | 5 refills | Status: DC

## 2017-03-25 MED ORDER — CYANOCOBALAMIN 1000 MCG/ML IJ SOLN
1000.0000 ug | Freq: Once | INTRAMUSCULAR | Status: AC
  Administered 2017-03-25: 1000 ug via INTRAMUSCULAR

## 2017-03-25 NOTE — Progress Notes (Signed)
Subjective:  Patient ID: Alyssa Luna, female    DOB: 21-Aug-1933  Age: 83 y.o. MRN: [id number]  CC: No chief complaint on file.   HPI Alyssa Luna presents for tick bites, GERD, dry mouth - saw Dr Redmond Baseman (ENT).  C/o meat allergy that followed tick bites. She believes that Gabapentin has a gelatin shell that may irritate her since she has allergy to beef now  Outpatient Medications Prior to Visit  Medication Sig Dispense Refill  . alum & mag hydroxide-simeth (MAALOX/MYLANTA) 200-200-20 MG/5ML suspension Take 15 mLs by mouth every 6 (six) hours as needed for indigestion or heartburn.     [name] amiodarone (PACERONE) 200 MG tablet Take by mouth.    . ciclopirox (PENLAC) 8 % solution Apply topically at bedtime. Apply over nail and surrounding skin. Apply daily over previous coat. After seven (7) days, may remove with alcohol and continue cycle. 6.6 mL 0  . Cyanocobalamin (VITAMIN B-12 IJ) Inject as directed every 30 (thirty) days.    [name] denosumab (PROLIA) 60 MG/ML SOLN injection Inject 60 mg into the skin every 6 (six) months. Administer in upper arm, thigh, or abdomen Unable to tolerate oral meds Dx severe osteoporosis 1.8 mL 6  . famotidine (PEPCID) 20 MG tablet Take 1 tablet (20 mg total) by mouth 3 (three) times daily. 90 tablet 5  . gabapentin (NEURONTIN) 100 MG capsule Take 1 to 2 capsules by mouth 4 times daily 180 capsule 5  . hydrocortisone 2.5 % cream Apply 1 application topically 3 (three) times daily as needed (itching).    . Rivaroxaban (XARELTO) 15 MG TABS tablet Take 15 mg by mouth daily.     . Simethicone (GAS-X PO) Take 1 tablet by mouth daily as needed (flatulence). Reported on 09/01/2015    . Wheat Dextrin (BENEFIBER PO) Take by mouth. Mix 2 teaspoonsful in 6-8 ounces of water and drink once daily     No facility-administered medications prior to visit.     ROS Review of Systems  Constitutional: Positive for fatigue. Negative for activity change, appetite change,  chills and unexpected weight change.  HENT: Negative for congestion, mouth sores and sinus pressure.   Eyes: Negative for visual disturbance.  Respiratory: Negative for cough and chest tightness.   Gastrointestinal: Negative for abdominal pain and nausea.  Genitourinary: Negative for difficulty urinating, frequency and vaginal pain.  Musculoskeletal: Positive for arthralgias. Negative for back pain and gait problem.  Skin: Negative for pallor and rash.  Neurological: Positive for weakness. Negative for dizziness, tremors, numbness and headaches.  Psychiatric/Behavioral: Negative for confusion and sleep disturbance.    Objective:  BP 128/70 (BP Location: Left Arm, Patient Position: Sitting, Cuff Size: Normal)   Pulse (!) 53   Temp (!) 97.5 F (36.4 C) (Oral)   Ht 5' 3"  (1.6 m)   Wt 134 lb (60.8 kg)   SpO2 97%   BMI 23.74 kg/m   BP Readings from Last 3 Encounters:  03/25/17 128/70  02/26/17 128/68  02/01/17 130/64    Wt Readings from Last 3 Encounters:  03/25/17 134 lb (60.8 kg)  02/26/17 136 lb (61.7 kg)  02/01/17 139 lb (63 kg)    Physical Exam  Constitutional: She appears well-developed. No distress.  HENT:  Head: Normocephalic.  Right Ear: External ear normal.  Left Ear: External ear normal.  Nose: Nose normal.  Mouth/Throat: Oropharynx is clear and moist.  Eyes: Pupils are equal, round, and reactive to light. Conjunctivae are normal. Right eye exhibits  no discharge. Left eye exhibits no discharge.  Neck: Normal range of motion. Neck supple. No JVD present. No tracheal deviation present. No thyromegaly present.  Cardiovascular: Normal rate, regular rhythm and normal heart sounds.   Pulmonary/Chest: No stridor. No respiratory distress. She has no wheezes.  Abdominal: Soft. Bowel sounds are normal. She exhibits no distension and no mass. There is no tenderness. There is no rebound and no guarding.  Musculoskeletal: She exhibits edema and tenderness.  Lymphadenopathy:      She has no cervical adenopathy.  Neurological: She displays normal reflexes. No cranial nerve deficit. She exhibits normal muscle tone. Coordination abnormal.  Skin: No rash noted. No erythema.  Psychiatric: She has a normal mood and affect. Her behavior is normal. Judgment and thought content normal.    Lab Results  Component Value Date   WBC 4.0 01/27/2017   HGB 12.9 01/27/2017   HCT 39.5 01/27/2017   PLT 260.0 01/27/2017   GLUCOSE 91 01/27/2017   CHOL 189 10/30/2014   TRIG 151.0 (H) 10/30/2014   HDL 41.30 10/30/2014   LDLDIRECT 178.0 09/24/2011   LDLCALC 118 (H) 10/30/2014   ALT 21 01/27/2017   AST 28 01/27/2017   NA 140 01/27/2017   K 4.8 01/27/2017   CL 106 01/27/2017   CREATININE 0.85 01/27/2017   BUN 11 01/27/2017   CO2 29 01/27/2017   TSH 4.44 11/04/2016   INR 1.7 10/18/2015    No results found.  Assessment & Plan:   There are no diagnoses linked to this encounter. I am having Ms. Leu maintain her Cyanocobalamin (VITAMIN B-12 IJ), hydrocortisone, alum & mag hydroxide-simeth, Simethicone (GAS-X PO), denosumab, Rivaroxaban, ciclopirox, Wheat Dextrin (BENEFIBER PO), amiodarone, gabapentin, and famotidine.  No orders of the defined types were placed in this encounter.    Follow-up: No Follow-up on file.  Walker Kehr, MD

## 2017-03-25 NOTE — Assessment & Plan Note (Signed)
Low dose Clonazepam  Potential benefits of a long term benzodiazepines  use as well as potential risks  and complications were explained to the patient and were aknowledged.

## 2017-03-25 NOTE — Assessment & Plan Note (Signed)
Pt delayed the inj due to joint pain

## 2017-03-25 NOTE — Addendum Note (Signed)
Addended by: Karren Cobble on: 03/25/2017 12:05 PM   Modules accepted: Orders

## 2017-03-25 NOTE — Assessment & Plan Note (Signed)
B 12

## 2017-03-25 NOTE — Assessment & Plan Note (Signed)
Gabapentin - d/c.  She believes that Gabapentin has a gelatin shell that may irritate her since she has allergy to beef now. Try Lyrica

## 2017-03-25 NOTE — Assessment & Plan Note (Signed)
Xarelto

## 2017-04-05 ENCOUNTER — Ambulatory Visit (INDEPENDENT_AMBULATORY_CARE_PROVIDER_SITE_OTHER): Payer: Medicare Other | Admitting: Podiatry

## 2017-04-05 DIAGNOSIS — B351 Tinea unguium: Secondary | ICD-10-CM

## 2017-04-05 DIAGNOSIS — M79609 Pain in unspecified limb: Secondary | ICD-10-CM | POA: Diagnosis not present

## 2017-04-05 DIAGNOSIS — L600 Ingrowing nail: Secondary | ICD-10-CM | POA: Diagnosis not present

## 2017-04-05 MED ORDER — NONFORMULARY OR COMPOUNDED ITEM: 1.0000 g | Freq: Every day | 11 refills | Status: DC

## 2017-04-07 NOTE — Progress Notes (Signed)
Subjective:    Patient ID: Alyssa Luna, female   DOB: 83 y.o.   MRN: [id number]   HPI patient presents with elongated nailbeds 1-5 both feet that been thick yellow brittle and painful when palpated    ROS      Objective:  Physical Exam neurovascular status intact with thick yellow brittle nailbeds 1-5 both feet that are painful     Assessment:    Mycotic nail infection with pain 1-5 both feet     Plan:    Debridement painful nailbeds 1-5 both feet with no iatrogenic bleeding noted

## 2017-04-09 ENCOUNTER — Ambulatory Visit (INDEPENDENT_AMBULATORY_CARE_PROVIDER_SITE_OTHER): Payer: Medicare Other | Admitting: Internal Medicine

## 2017-04-09 ENCOUNTER — Encounter: Payer: Self-pay | Admitting: Internal Medicine

## 2017-04-09 DIAGNOSIS — F411 Generalized anxiety disorder: Secondary | ICD-10-CM | POA: Diagnosis not present

## 2017-04-09 DIAGNOSIS — E538 Deficiency of other specified B group vitamins: Secondary | ICD-10-CM

## 2017-04-09 DIAGNOSIS — R197 Diarrhea, unspecified: Secondary | ICD-10-CM

## 2017-04-09 DIAGNOSIS — M2550 Pain in unspecified joint: Secondary | ICD-10-CM

## 2017-04-09 DIAGNOSIS — J069 Acute upper respiratory infection, unspecified: Secondary | ICD-10-CM | POA: Diagnosis not present

## 2017-04-09 MED ORDER — AZITHROMYCIN 250 MG PO TABS: ORAL_TABLET | ORAL | 0 refills | Status: DC

## 2017-04-09 MED ORDER — PSYLLIUM 58.6 % PO POWD: ORAL | 12 refills | Status: DC

## 2017-04-09 NOTE — Assessment & Plan Note (Signed)
Use metamucil

## 2017-04-09 NOTE — Assessment & Plan Note (Signed)
Low dose clonazepam

## 2017-04-09 NOTE — Assessment & Plan Note (Signed)
On B12 

## 2017-04-09 NOTE — Progress Notes (Signed)
Subjective:  Patient ID: Alyssa Luna, female    DOB: [birth year]  Age: 83 y.o. MRN: [id number]  CC: No chief complaint on file.   HPI [name] presents for ST and sinus drainage. C/o bloody nasal d/c x 1-2 weeks Pt saw Dr Redmond Baseman, ENT 2 weeks ago, saw Dr Hilarie Fredrickson - GI  Outpatient Medications Prior to Visit  Medication Sig Dispense Refill  . alum & mag hydroxide-simeth (MAALOX/MYLANTA) 200-200-20 MG/5ML suspension Take 15 mLs by mouth every 6 (six) hours as needed for indigestion or heartburn.     [name] amiodarone (PACERONE) 200 MG tablet Take by mouth.    . ciclopirox (PENLAC) 8 % solution Apply topically at bedtime. Apply over nail and surrounding skin. Apply daily over previous coat. After seven (7) days, may remove with alcohol and continue cycle. 6.6 mL 0  . Cyanocobalamin (VITAMIN B-12 IJ) Inject as directed every 30 (thirty) days.    [name] denosumab (PROLIA) 60 MG/ML SOLN injection Inject 60 mg into the skin every 6 (six) months. Administer in upper arm, thigh, or abdomen Unable to tolerate oral meds Dx severe osteoporosis 1.8 mL 6  . famotidine (PEPCID) 20 MG tablet Take 1 tablet (20 mg total) by mouth 3 (three) times daily. 90 tablet 5  . hydrocortisone 2.5 % cream Apply 1 application topically 3 (three) times daily as needed (itching).    . NONFORMULARY OR COMPOUNDED ITEM Apply 1-2 g topically daily. [employer] Nail lacquer: Fluconazole 2%, Terbinafine 1%, DMSO 120 each 11  . pregabalin (LYRICA) 50 MG capsule Take 1 capsule (50 mg total) by mouth 2 (two) times daily. 60 capsule 5  . Rivaroxaban (XARELTO) 15 MG TABS tablet Take 15 mg by mouth daily.     . Simethicone (GAS-X PO) Take 1 tablet by mouth daily as needed (flatulence). Reported on 09/01/2015    . Wheat Dextrin (BENEFIBER PO) Take by mouth. Mix 2 teaspoonsful in 6-8 ounces of water and drink once daily     No facility-administered medications prior to visit.     ROS Review of Systems  Constitutional: Positive for  chills and fatigue. Negative for activity change, appetite change and unexpected weight change.  HENT: Positive for congestion, postnasal drip, rhinorrhea, sinus pressure and sore throat. Negative for mouth sores.   Eyes: Negative for visual disturbance.  Respiratory: Negative for cough and chest tightness.   Gastrointestinal: Negative for abdominal pain and nausea.  Genitourinary: Negative for difficulty urinating, frequency and vaginal pain.  Musculoskeletal: Positive for arthralgias, back pain, gait problem and myalgias.  Skin: Negative for pallor and rash.  Neurological: Positive for dizziness. Negative for tremors, weakness, numbness and headaches.  Psychiatric/Behavioral: Negative for confusion and sleep disturbance.    Objective:  BP (!) 152/78 (BP Location: Left Arm, Patient Position: Sitting, Cuff Size: Normal)   Pulse (!) 53   Temp 97.8 F (36.6 C) (Oral)   Ht 5' 3"  (1.6 m)   Wt 133 lb (60.3 kg)   SpO2 98%   BMI 23.56 kg/m   BP Readings from Last 3 Encounters:  04/09/17 (!) 152/78  03/25/17 128/70  02/26/17 128/68    Wt Readings from Last 3 Encounters:  04/09/17 133 lb (60.3 kg)  03/25/17 134 lb (60.8 kg)  02/26/17 136 lb (61.7 kg)    Physical Exam  Constitutional: She appears well-developed. No distress.  HENT:  Head: Normocephalic.  Right Ear: External ear normal.  Left Ear: External ear normal.  Nose: Nose normal.  Mouth/Throat: Oropharynx is  clear and moist.  Eyes: Pupils are equal, round, and reactive to light. Conjunctivae are normal. Right eye exhibits no discharge. Left eye exhibits no discharge.  Neck: Normal range of motion. Neck supple. No JVD present. No tracheal deviation present. No thyromegaly present.  Cardiovascular: Normal rate, regular rhythm and normal heart sounds.   Pulmonary/Chest: No stridor. No respiratory distress. She has no wheezes.  Abdominal: Soft. Bowel sounds are normal. She exhibits no distension and no mass. There is no  tenderness. There is no rebound and no guarding.  Musculoskeletal: She exhibits tenderness. She exhibits no edema.  Lymphadenopathy:    She has no cervical adenopathy.  Neurological: She displays normal reflexes. No cranial nerve deficit. She exhibits normal muscle tone. Coordination normal.  Skin: No rash noted. No erythema.  Psychiatric: She has a normal mood and affect. Her behavior is normal. Judgment and thought content normal.  eryth throat  Lab Results  Component Value Date   WBC 4.0 01/27/2017   HGB 12.9 01/27/2017   HCT 39.5 01/27/2017   PLT 260.0 01/27/2017   GLUCOSE 91 01/27/2017   CHOL 189 10/30/2014   TRIG 151.0 (H) 10/30/2014   HDL 41.30 10/30/2014   LDLDIRECT 178.0 09/24/2011   LDLCALC 118 (H) 10/30/2014   ALT 21 01/27/2017   AST 28 01/27/2017   NA 140 01/27/2017   K 4.8 01/27/2017   CL 106 01/27/2017   CREATININE 0.85 01/27/2017   BUN 11 01/27/2017   CO2 29 01/27/2017   TSH 4.44 11/04/2016   INR 1.7 10/18/2015    No results found.  Assessment & Plan:   There are no diagnoses linked to this encounter. I am having Ms. Martindelcampo maintain her Cyanocobalamin (VITAMIN B-12 IJ), hydrocortisone, alum & mag hydroxide-simeth, Simethicone (GAS-X PO), denosumab, Rivaroxaban, ciclopirox, Wheat Dextrin (BENEFIBER PO), amiodarone, famotidine, pregabalin, and NONFORMULARY OR COMPOUNDED ITEM.  No orders of the defined types were placed in this encounter.    Follow-up: No Follow-up on file.  Walker Kehr, MD

## 2017-04-09 NOTE — Assessment & Plan Note (Signed)
D/c benefiber

## 2017-04-09 NOTE — Assessment & Plan Note (Signed)
zpac if worse

## 2017-04-28 DIAGNOSIS — T7840XA Allergy, unspecified, initial encounter: Secondary | ICD-10-CM | POA: Insufficient documentation

## 2017-05-04 ENCOUNTER — Ambulatory Visit (INDEPENDENT_AMBULATORY_CARE_PROVIDER_SITE_OTHER): Payer: Medicare Other | Admitting: Internal Medicine

## 2017-05-04 ENCOUNTER — Encounter: Payer: Self-pay | Admitting: Internal Medicine

## 2017-05-04 VITALS — BP 142/78 | HR 52 | Temp 97.5°F | Ht 63.0 in | Wt 133.0 lb

## 2017-05-04 DIAGNOSIS — E538 Deficiency of other specified B group vitamins: Secondary | ICD-10-CM | POA: Diagnosis not present

## 2017-05-04 DIAGNOSIS — F411 Generalized anxiety disorder: Secondary | ICD-10-CM | POA: Diagnosis not present

## 2017-05-04 DIAGNOSIS — I10 Essential (primary) hypertension: Secondary | ICD-10-CM

## 2017-05-04 DIAGNOSIS — E559 Vitamin D deficiency, unspecified: Secondary | ICD-10-CM | POA: Diagnosis not present

## 2017-05-04 DIAGNOSIS — Z23 Encounter for immunization: Secondary | ICD-10-CM | POA: Diagnosis not present

## 2017-05-04 DIAGNOSIS — W57XXXS Bitten or stung by nonvenomous insect and other nonvenomous arthropods, sequela: Secondary | ICD-10-CM

## 2017-05-04 MED ORDER — CYANOCOBALAMIN 1000 MCG/ML IJ SOLN
1000.0000 ug | Freq: Once | INTRAMUSCULAR | Status: AC
Start: 2017-05-04 — End: 2017-05-04
  Administered 2017-05-04: 1000 ug via INTRAMUSCULAR

## 2017-05-04 NOTE — Assessment & Plan Note (Addendum)
Doing better.   

## 2017-05-04 NOTE — Progress Notes (Signed)
Subjective:  Patient ID: Alyssa Luna, female    DOB: 11/01/1933  Age: 83 y.o. MRN: [id number]  CC: No chief complaint on file.   HPI Alyssa Luna presents for URI sx's x 1 week F/u allergy to meats - s/p [facility] eval: on Zyrtec C/o nausea  Outpatient Medications Prior to Visit  Medication Sig Dispense Refill  . alum & mag hydroxide-simeth (MAALOX/MYLANTA) 200-200-20 MG/5ML suspension Take 15 mLs by mouth every 6 (six) hours as needed for indigestion or heartburn.     [name] amiodarone (PACERONE) 200 MG tablet Take by mouth.    [name] azithromycin (ZITHROMAX Z-PAK) 250 MG tablet As directed 6 each 0  . Cyanocobalamin (VITAMIN B-12 IJ) Inject as directed every 30 (thirty) days.    [name] denosumab (PROLIA) 60 MG/ML SOLN injection Inject 60 mg into the skin every 6 (six) months. Administer in upper arm, thigh, or abdomen Unable to tolerate oral meds Dx severe osteoporosis 1.8 mL 6  . famotidine (PEPCID) 20 MG tablet Take 1 tablet (20 mg total) by mouth 3 (three) times daily. 90 tablet 5  . hydrocortisone 2.5 % cream Apply 1 application topically 3 (three) times daily as needed (itching).    . NONFORMULARY OR COMPOUNDED ITEM Apply 1-2 g topically daily. [employer] Nail lacquer: Fluconazole 2%, Terbinafine 1%, DMSO 120 each 11  . psyllium (METAMUCIL SMOOTH TEXTURE) 58.6 % powder Use 1-3 times a day 283 g 12  . Rivaroxaban (XARELTO) 15 MG TABS tablet Take 15 mg by mouth daily.     . Simethicone (GAS-X PO) Take 1 tablet by mouth daily as needed (flatulence). Reported on 09/01/2015    . ciclopirox (PENLAC) 8 % solution Apply topically at bedtime. Apply over nail and surrounding skin. Apply daily over previous coat. After seven (7) days, may remove with alcohol and continue cycle. (Patient not taking: Reported on 05/04/2017) 6.6 mL 0   No facility-administered medications prior to visit.     ROS Review of Systems  Constitutional: Positive for fatigue. Negative for activity change, appetite change,  chills and unexpected weight change.  HENT: Positive for congestion, postnasal drip and sinus pressure. Negative for mouth sores.   Eyes: Negative for visual disturbance.  Respiratory: Negative for cough and chest tightness.   Gastrointestinal: Positive for nausea. Negative for abdominal pain.  Genitourinary: Negative for difficulty urinating, frequency and vaginal pain.  Musculoskeletal: Negative for back pain and gait problem.  Skin: Negative for pallor and rash.  Neurological: Negative for dizziness, tremors, weakness, numbness and headaches.  Psychiatric/Behavioral: Negative for confusion and sleep disturbance. The patient is nervous/anxious.     Objective:  BP (!) 142/78 (BP Location: Right Arm, Patient Position: Sitting, Cuff Size: Normal)   Pulse (!) 52   Temp (!) 97.5 F (36.4 C) (Oral)   Ht 5' 3"  (1.6 m)   Wt 133 lb (60.3 kg)   SpO2 98%   BMI 23.56 kg/m   BP Readings from Last 3 Encounters:  05/04/17 (!) 142/78  04/09/17 (!) 152/78  03/25/17 128/70    Wt Readings from Last 3 Encounters:  05/04/17 133 lb (60.3 kg)  04/09/17 133 lb (60.3 kg)  03/25/17 134 lb (60.8 kg)    Physical Exam  Constitutional: She appears well-developed. No distress.  HENT:  Head: Normocephalic.  Right Ear: External ear normal.  Left Ear: External ear normal.  Nose: Nose normal.  Mouth/Throat: Oropharynx is clear and moist.  Eyes: Pupils are equal, round, and reactive to light. Conjunctivae are normal.  Right eye exhibits no discharge. Left eye exhibits no discharge.  Neck: Normal range of motion. Neck supple. No JVD present. No tracheal deviation present. No thyromegaly present.  Cardiovascular: Normal rate, regular rhythm and normal heart sounds.   Pulmonary/Chest: No stridor. No respiratory distress. She has no wheezes.  Abdominal: Soft. Bowel sounds are normal. She exhibits no distension and no mass. There is no tenderness. There is no rebound and no guarding.  Musculoskeletal: She  exhibits tenderness. She exhibits no edema.  Lymphadenopathy:    She has no cervical adenopathy.  Neurological: She displays normal reflexes. No cranial nerve deficit. She exhibits normal muscle tone. Coordination normal.  Skin: No rash noted. No erythema.  Psychiatric: She has a normal mood and affect. Her behavior is normal. Judgment and thought content normal.  swollen nasal mucosa  Lab Results  Component Value Date   WBC 4.0 01/27/2017   HGB 12.9 01/27/2017   HCT 39.5 01/27/2017   PLT 260.0 01/27/2017   GLUCOSE 91 01/27/2017   CHOL 189 10/30/2014   TRIG 151.0 (H) 10/30/2014   HDL 41.30 10/30/2014   LDLDIRECT 178.0 09/24/2011   LDLCALC 118 (H) 10/30/2014   ALT 21 01/27/2017   AST 28 01/27/2017   NA 140 01/27/2017   K 4.8 01/27/2017   CL 106 01/27/2017   CREATININE 0.85 01/27/2017   BUN 11 01/27/2017   CO2 29 01/27/2017   TSH 4.44 11/04/2016   INR 1.7 10/18/2015    No results found.  Assessment & Plan:   There are no diagnoses linked to this encounter. I have discontinued Ms. Seyfried's ciclopirox. I am also having her maintain her Cyanocobalamin (VITAMIN B-12 IJ), hydrocortisone, alum & mag hydroxide-simeth, Simethicone (GAS-X PO), denosumab, Rivaroxaban, amiodarone, famotidine, NONFORMULARY OR COMPOUNDED ITEM, psyllium, azithromycin, and Cetirizine HCl (ZYRTEC PO).  Meds ordered this encounter  Medications  . Cetirizine HCl (ZYRTEC PO)    Sig: Take by mouth.     Follow-up: No Follow-up on file.  Walker Kehr, MD

## 2017-05-04 NOTE — Addendum Note (Signed)
Addended by: Karren Cobble on: 05/04/2017 11:35 AM   Modules accepted: Orders

## 2017-05-04 NOTE — Patient Instructions (Signed)
Use [employer] Peroxicare tooth paste

## 2017-05-04 NOTE — Assessment & Plan Note (Signed)
Losartan 1/2 tab prn - stopped

## 2017-05-04 NOTE — Assessment & Plan Note (Signed)
On B12 

## 2017-05-04 NOTE — Assessment & Plan Note (Signed)
s/p a [place] tick bite x several times and is worried about beef allergy now; she had an appt w/Dr Quenton Fetter at [facility]. Her blood test IgE (+) beef, milk. On Zyrtec

## 2017-05-04 NOTE — Assessment & Plan Note (Signed)
On Vit D 

## 2017-05-05 ENCOUNTER — Telehealth: Payer: Self-pay | Admitting: Internal Medicine

## 2017-05-05 NOTE — Telephone Encounter (Signed)
Pt called checking on a prescription that was suppose to be called in to [facility] in [facility]. She said that it was for liquid Gabapentin.

## 2017-05-05 NOTE — Telephone Encounter (Signed)
Please advise 

## 2017-05-06 MED ORDER — GABAPENTIN 300 MG/6ML PO SOLN: 100.0000 mg | Freq: Two times a day (BID) | ORAL | 3 refills | Status: DC | PRN

## 2017-05-06 NOTE — Telephone Encounter (Signed)
Done. Thx.

## 2017-06-04 ENCOUNTER — Ambulatory Visit (INDEPENDENT_AMBULATORY_CARE_PROVIDER_SITE_OTHER): Payer: Medicare Other

## 2017-06-04 ENCOUNTER — Ambulatory Visit: Payer: Medicare Other | Admitting: Internal Medicine

## 2017-06-04 DIAGNOSIS — M810 Age-related osteoporosis without current pathological fracture: Secondary | ICD-10-CM

## 2017-06-04 MED ORDER — DENOSUMAB 60 MG/ML ~~LOC~~ SOLN
60.0000 mg | Freq: Once | SUBCUTANEOUS | Status: AC
  Administered 2017-06-04: 60 mg via SUBCUTANEOUS

## 2017-06-04 NOTE — Progress Notes (Signed)
prolia Injection given.   Binnie Rail, MD
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
## 2017-06-09 NOTE — Progress Notes (Signed)
Pre visit review using our clinic review tool, if applicable. No additional management support is needed unless otherwise documented below in the visit note. 

## 2017-06-10 ENCOUNTER — Ambulatory Visit (INDEPENDENT_AMBULATORY_CARE_PROVIDER_SITE_OTHER): Payer: Medicare Other | Admitting: *Deleted

## 2017-06-10 VITALS — BP 132/64 | HR 50 | Resp 20 | Ht 63.0 in | Wt 134.0 lb

## 2017-06-10 DIAGNOSIS — Z0000 Encounter for general adult medical examination without abnormal findings: Secondary | ICD-10-CM | POA: Diagnosis not present

## 2017-06-10 NOTE — Patient Instructions (Signed)
Continue doing brain stimulating activities (puzzles, reading, adult coloring books, staying active) to keep memory sharp.   Continue to eat heart healthy diet (full of fruits, vegetables, whole grains, lean protein, water--limit salt, fat, and sugar intake) and increase physical activity as tolerated.   Alyssa Luna , Thank you for taking time to come for your Medicare Wellness Visit. I appreciate your ongoing commitment to your health goals. Please review the following plan we discussed and let me know if I can assist you in the future.   These are the goals we discussed: Goals    . Stay as active and as independent as possible          Continue to plant my flowers, enjoy life, and travel with my husband and friends.       This is a list of the screening recommended for you and due dates:  Health Maintenance  Topic Date Due  . Tetanus Vaccine  01/16/2017  . Flu Shot  Completed  . DEXA scan (bone density measurement)  Completed  . Pneumonia vaccines  Completed

## 2017-06-14 NOTE — Progress Notes (Signed)
Medical screening examination/treatment/procedure(s) were performed by the [employer], RN. As primary care provider I was immediately available for consulation/collaboration. I agree with above documentation. Wilfred Lacy, AGNP-C

## 2017-06-22 ENCOUNTER — Other Ambulatory Visit (INDEPENDENT_AMBULATORY_CARE_PROVIDER_SITE_OTHER): Payer: Medicare Other

## 2017-06-22 ENCOUNTER — Ambulatory Visit: Payer: Medicare Other | Admitting: Internal Medicine

## 2017-06-22 ENCOUNTER — Encounter: Payer: Self-pay | Admitting: Internal Medicine

## 2017-06-22 DIAGNOSIS — G8929 Other chronic pain: Secondary | ICD-10-CM | POA: Diagnosis not present

## 2017-06-22 DIAGNOSIS — S32040S Wedge compression fracture of fourth lumbar vertebra, sequela: Secondary | ICD-10-CM

## 2017-06-22 DIAGNOSIS — E559 Vitamin D deficiency, unspecified: Secondary | ICD-10-CM | POA: Diagnosis not present

## 2017-06-22 DIAGNOSIS — E538 Deficiency of other specified B group vitamins: Secondary | ICD-10-CM | POA: Diagnosis not present

## 2017-06-22 DIAGNOSIS — M5440 Lumbago with sciatica, unspecified side: Secondary | ICD-10-CM

## 2017-06-22 LAB — CBC WITH DIFFERENTIAL/PLATELET
BASOS PCT: 1.4 % (ref 0.0–3.0)
Basophils Absolute: 0.1 10*3/uL (ref 0.0–0.1)
EOS ABS: 0.1 10*3/uL (ref 0.0–0.7)
EOS PCT: 2.2 % (ref 0.0–5.0)
HEMATOCRIT: 39.7 % (ref 36.0–46.0)
HEMOGLOBIN: 12.9 g/dL (ref 12.0–15.0)
LYMPHS PCT: 42 % (ref 12.0–46.0)
Lymphs Abs: 1.8 10*3/uL (ref 0.7–4.0)
MCHC: 32.6 g/dL (ref 30.0–36.0)
MCV: 89.4 fl (ref 78.0–100.0)
MONO ABS: 0.5 10*3/uL (ref 0.1–1.0)
Monocytes Relative: 11 % (ref 3.0–12.0)
NEUTROS ABS: 1.9 10*3/uL (ref 1.4–7.7)
NEUTROS PCT: 43.4 % (ref 43.0–77.0)
PLATELETS: 224 10*3/uL (ref 150.0–400.0)
RBC: 4.44 Mil/uL (ref 3.87–5.11)
RDW: 14.8 % (ref 11.5–15.5)
WBC: 4.3 10*3/uL (ref 4.0–10.5)

## 2017-06-22 LAB — BASIC METABOLIC PANEL
BUN: 13 mg/dL (ref 6–23)
CO2: 30 mEq/L (ref 19–32)
CREATININE: 0.79 mg/dL (ref 0.40–1.20)
Calcium: 9.1 mg/dL (ref 8.4–10.5)
Chloride: 107 mEq/L (ref 96–112)
GFR: 74.09 mL/min (ref >60.00)
Glucose, Bld: 88 mg/dL (ref 70–99)
Potassium: 4.7 mEq/L (ref 3.5–5.1)
Sodium: 141 mEq/L (ref 135–145)

## 2017-06-22 LAB — URINALYSIS
Bilirubin Urine: NEGATIVE
KETONES UR: NEGATIVE
Leukocytes, UA: NEGATIVE
Nitrite: NEGATIVE
PH: 6.5 (ref 5.0–8.0)
SPECIFIC GRAVITY, URINE: 1.01 (ref 1.000–1.030)
Total Protein, Urine: NEGATIVE
URINE GLUCOSE: NEGATIVE
UROBILINOGEN UA: 0.2 (ref 0.0–1.0)

## 2017-06-22 LAB — HEPATIC FUNCTION PANEL
ALT: 26 U/L (ref 0–35)
AST: 39 U/L — ABNORMAL HIGH (ref 0–37)
Albumin: 4.1 g/dL (ref 3.5–5.2)
Alkaline Phosphatase: 63 U/L (ref 39–117)
BILIRUBIN DIRECT: 0.1 mg/dL (ref 0.0–0.3)
BILIRUBIN TOTAL: 0.6 mg/dL (ref 0.2–1.2)
Total Protein: 7.1 g/dL (ref 6.0–8.3)

## 2017-06-22 LAB — VITAMIN D 25 HYDROXY (VIT D DEFICIENCY, FRACTURES): VITD: 29.31 ng/mL — AB (ref 30.00–100.00)

## 2017-06-22 LAB — TSH: TSH: 3.13 u[IU]/mL (ref 0.35–4.50)

## 2017-06-22 MED ORDER — ZOSTER VAC RECOMB ADJUVANTED 50 MCG/0.5ML IM SUSR: 0.5000 mL | Freq: Once | INTRAMUSCULAR | 1 refills | Status: AC

## 2017-06-22 MED ORDER — CYANOCOBALAMIN 1000 MCG/ML IJ SOLN
1000.0000 ug | Freq: Once | INTRAMUSCULAR | Status: AC
  Administered 2017-06-22: 1000 ug via INTRAMUSCULAR

## 2017-06-22 NOTE — Assessment & Plan Note (Signed)
On Vit D 

## 2017-06-22 NOTE — Progress Notes (Signed)
Subjective:  Patient ID: Alyssa Luna, female    DOB: [birth year]  Age: 83 y.o. MRN: [id number]  CC: No chief complaint on file.   HPI Alyssa Luna presents for beef allergy post tick bite - on diet C/o fatigue F/u B12 def    Outpatient Medications Prior to Visit  Medication Sig Dispense Refill  . alum & mag hydroxide-simeth (MAALOX/MYLANTA) 200-200-20 MG/5ML suspension Take 15 mLs by mouth every 6 (six) hours as needed for indigestion or heartburn.     [name] amiodarone (PACERONE) 200 MG tablet Take by mouth.    . Cetirizine HCl (ZYRTEC PO) Take by mouth.    . Cyanocobalamin (VITAMIN B-12 IJ) Inject as directed every 30 (thirty) days.    [name] denosumab (PROLIA) 60 MG/ML SOLN injection Inject 60 mg into the skin every 6 (six) months. Administer in upper arm, thigh, or abdomen Unable to tolerate oral meds Dx severe osteoporosis 1.8 mL 6  . famotidine (PEPCID) 20 MG tablet Take 1 tablet (20 mg total) by mouth 3 (three) times daily. 90 tablet 5  . Gabapentin 300 MG/6ML SOLN Take 100 mg by mouth 2 (two) times daily as needed. (Patient taking differently: Take 5 mLs 2 (two) times daily as needed by mouth. ) 470 mL 3  . hydrocortisone 2.5 % cream Apply 1 application topically 3 (three) times daily as needed (itching).    . Rivaroxaban (XARELTO) 15 MG TABS tablet Take 15 mg by mouth daily.     . Simethicone (GAS-X PO) Take 1 tablet by mouth daily as needed (flatulence). Reported on 09/01/2015    . Wheat Dextrin (BENEFIBER PO) Take 1 tablet by mouth daily.     No facility-administered medications prior to visit.     ROS Review of Systems  Constitutional: Positive for fatigue. Negative for activity change, appetite change, chills and unexpected weight change.  HENT: Negative for congestion, mouth sores and sinus pressure.   Eyes: Negative for visual disturbance.  Respiratory: Negative for cough and chest tightness.   Gastrointestinal: Negative for abdominal pain and nausea.    Genitourinary: Negative for difficulty urinating, frequency and vaginal pain.  Musculoskeletal: Positive for arthralgias and back pain. Negative for gait problem.  Skin: Negative for pallor and rash.  Neurological: Negative for dizziness, tremors, weakness, numbness and headaches.  Psychiatric/Behavioral: Positive for sleep disturbance. Negative for confusion.    Objective:  BP 128/72 (BP Location: Left Arm, Patient Position: Sitting, Cuff Size: Normal)   Pulse (!) 51   Temp 97.9 F (36.6 C) (Oral)   Ht 5' 3"  (1.6 m)   Wt 134 lb (60.8 kg)   SpO2 98%   BMI 23.74 kg/m   BP Readings from Last 3 Encounters:  06/22/17 128/72  06/10/17 132/64  05/04/17 (!) 142/78    Wt Readings from Last 3 Encounters:  06/22/17 134 lb (60.8 kg)  06/10/17 134 lb (60.8 kg)  05/04/17 133 lb (60.3 kg)    Physical Exam  Constitutional: She appears well-developed. No distress.  HENT:  Head: Normocephalic.  Right Ear: External ear normal.  Left Ear: External ear normal.  Nose: Nose normal.  Mouth/Throat: Oropharynx is clear and moist.  Eyes: Conjunctivae are normal. Pupils are equal, round, and reactive to light. Right eye exhibits no discharge. Left eye exhibits no discharge.  Neck: Normal range of motion. Neck supple. No JVD present. No tracheal deviation present. No thyromegaly present.  Cardiovascular: Normal rate, regular rhythm and normal heart sounds.  Pulmonary/Chest: No stridor. No respiratory  distress. She has no wheezes.  Abdominal: Soft. Bowel sounds are normal. She exhibits no distension and no mass. There is no tenderness. There is no rebound and no guarding.  Musculoskeletal: She exhibits tenderness. She exhibits no edema.  Lymphadenopathy:    She has no cervical adenopathy.  Neurological: She displays normal reflexes. No cranial nerve deficit. She exhibits normal muscle tone. Coordination normal.  Skin: No rash noted. No erythema.  Psychiatric: She has a normal mood and affect.  Her behavior is normal. Judgment and thought content normal.    Lab Results  Component Value Date   WBC 4.0 01/27/2017   HGB 12.9 01/27/2017   HCT 39.5 01/27/2017   PLT 260.0 01/27/2017   GLUCOSE 91 01/27/2017   CHOL 189 10/30/2014   TRIG 151.0 (H) 10/30/2014   HDL 41.30 10/30/2014   LDLDIRECT 178.0 09/24/2011   LDLCALC 118 (H) 10/30/2014   ALT 21 01/27/2017   AST 28 01/27/2017   NA 140 01/27/2017   K 4.8 01/27/2017   CL 106 01/27/2017   CREATININE 0.85 01/27/2017   BUN 11 01/27/2017   CO2 29 01/27/2017   TSH 4.44 11/04/2016   INR 1.7 10/18/2015    No results found.  Assessment & Plan:   There are no diagnoses linked to this encounter. I am having Javier Glazier. Jim maintain her Cyanocobalamin (VITAMIN B-12 IJ), hydrocortisone, alum & mag hydroxide-simeth, Simethicone (GAS-X PO), denosumab, Rivaroxaban, amiodarone, famotidine, Cetirizine HCl (ZYRTEC PO), Gabapentin, and Wheat Dextrin (BENEFIBER PO).  No orders of the defined types were placed in this encounter.    Follow-up: No Follow-up on file.  Walker Kehr, MD

## 2017-06-22 NOTE — Assessment & Plan Note (Signed)
Liquid Gabapentin

## 2017-06-22 NOTE — Assessment & Plan Note (Signed)
On Prolia

## 2017-06-22 NOTE — Addendum Note (Signed)
Addended by: Karren Cobble on: 06/22/2017 02:34 PM   Modules accepted: Orders

## 2017-06-22 NOTE — Assessment & Plan Note (Signed)
On B12 

## 2017-07-07 ENCOUNTER — Ambulatory Visit: Payer: Medicare Other | Admitting: Podiatry

## 2017-07-14 ENCOUNTER — Ambulatory Visit: Payer: Medicare Other | Admitting: Podiatry

## 2017-07-14 ENCOUNTER — Encounter: Payer: Self-pay | Admitting: Podiatry

## 2017-07-14 DIAGNOSIS — M79609 Pain in unspecified limb: Secondary | ICD-10-CM

## 2017-07-14 DIAGNOSIS — D689 Coagulation defect, unspecified: Secondary | ICD-10-CM

## 2017-07-14 DIAGNOSIS — B351 Tinea unguium: Secondary | ICD-10-CM

## 2017-07-14 NOTE — Progress Notes (Signed)
Subjective:    Patient ID: Alyssa Luna, female   DOB: 83 y.o.   MRN: [id number]   HPI patient presents with elongated nailbeds 1-5 both feet that are thick yellow and painful    ROS      Objective:  Physical Exam neurovascular status intact with yellow brittle nailbeds that are painful 1-5 both feet     Assessment:   Mycotic nail infections with pain 1-5 both feet      Plan:     Debris painful nailbeds 1-5 both feet with no iatrogenic bleeding noted

## 2017-08-20 ENCOUNTER — Encounter: Payer: Self-pay | Admitting: Internal Medicine

## 2017-09-10 ENCOUNTER — Ambulatory Visit (INDEPENDENT_AMBULATORY_CARE_PROVIDER_SITE_OTHER): Payer: Medicare Other | Admitting: *Deleted

## 2017-09-10 ENCOUNTER — Telehealth: Payer: Self-pay | Admitting: *Deleted

## 2017-09-10 DIAGNOSIS — M810 Age-related osteoporosis without current pathological fracture: Secondary | ICD-10-CM

## 2017-09-10 DIAGNOSIS — E538 Deficiency of other specified B group vitamins: Secondary | ICD-10-CM

## 2017-09-10 MED ORDER — CYANOCOBALAMIN 1000 MCG/ML IJ SOLN
1000.0000 ug | Freq: Once | INTRAMUSCULAR | Status: AC
  Administered 2017-09-10: 1000 ug via INTRAMUSCULAR

## 2017-09-10 NOTE — Telephone Encounter (Signed)
Pt came to get her monthly B12 injection. She states she received a call from [facility] stating it is time for her to have her bone density. Its been over two years needing referral for bone density to be sent to [facility]...[name]

## 2017-09-13 NOTE — Telephone Encounter (Signed)
Ok pls order Thx

## 2017-09-13 NOTE — Telephone Encounter (Signed)
Notified pt order has been placed.Marland KitchenJohny Luna

## 2017-09-22 ENCOUNTER — Ambulatory Visit: Payer: Medicare Other | Admitting: Internal Medicine

## 2017-09-22 ENCOUNTER — Encounter: Payer: Self-pay | Admitting: Internal Medicine

## 2017-09-22 DIAGNOSIS — F411 Generalized anxiety disorder: Secondary | ICD-10-CM | POA: Diagnosis not present

## 2017-09-22 DIAGNOSIS — M5440 Lumbago with sciatica, unspecified side: Secondary | ICD-10-CM

## 2017-09-22 DIAGNOSIS — I10 Essential (primary) hypertension: Secondary | ICD-10-CM

## 2017-09-22 DIAGNOSIS — G8929 Other chronic pain: Secondary | ICD-10-CM | POA: Diagnosis not present

## 2017-09-22 DIAGNOSIS — E538 Deficiency of other specified B group vitamins: Secondary | ICD-10-CM

## 2017-09-22 DIAGNOSIS — M810 Age-related osteoporosis without current pathological fracture: Secondary | ICD-10-CM

## 2017-09-22 NOTE — Assessment & Plan Note (Signed)
Clonazepam prn

## 2017-09-22 NOTE — Patient Instructions (Signed)
You can use over-the-counter  "cold" medicines  such as "Afrin" nasal spray for nasal congestion as directed. Use " Delsym" or" Robitussin" cough syrup varietis for cough.  You can use plain Theraflu for fever, chills and achyness. Use Halls or Ricola cough drops.   "Common cold" symptoms are usually triggered by a virus.  The antibiotics are usually not necessary. On average, a" viral cold" illness would take 5-7 days to resolve.   Please, make an appointment if you are not better or if you're worse.

## 2017-09-22 NOTE — Assessment & Plan Note (Signed)
Gabapentin po

## 2017-09-22 NOTE — Assessment & Plan Note (Signed)
Prolia due 3/29

## 2017-09-22 NOTE — Assessment & Plan Note (Signed)
Off Rx 

## 2017-09-22 NOTE — Assessment & Plan Note (Signed)
B 12

## 2017-09-22 NOTE — Progress Notes (Signed)
Subjective:  Patient ID: Alyssa Luna, female    DOB: [birth year]  Age: 84 y.o. MRN: [id number]  CC: No chief complaint on file.   HPI Alyssa Luna presents for sinus congestion and cough, runny nose x4-5 d F/u A fib. F/u osteoporosis  Outpatient Medications Prior to Visit  Medication Sig Dispense Refill  . alum & mag hydroxide-simeth (MAALOX/MYLANTA) 200-200-20 MG/5ML suspension Take 15 mLs by mouth every 6 (six) hours as needed for indigestion or heartburn.     [name] amiodarone (PACERONE) 200 MG tablet Take by mouth.    . Cetirizine HCl (ZYRTEC PO) Take by mouth.    . Cyanocobalamin (VITAMIN B-12 IJ) Inject as directed every 30 (thirty) days.    [name] denosumab (PROLIA) 60 MG/ML SOLN injection Inject 60 mg into the skin every 6 (six) months. Administer in upper arm, thigh, or abdomen Unable to tolerate oral meds Dx severe osteoporosis 1.8 mL 6  . famotidine (PEPCID) 20 MG tablet Take 1 tablet (20 mg total) by mouth 3 (three) times daily. 90 tablet 5  . Gabapentin 300 MG/6ML SOLN Take 100 mg by mouth 2 (two) times daily as needed. (Patient taking differently: Take 5 mLs 2 (two) times daily as needed by mouth. ) 470 mL 3  . hydrocortisone 2.5 % cream Apply 1 application topically 3 (three) times daily as needed (itching).    . Rivaroxaban (XARELTO) 15 MG TABS tablet Take 15 mg by mouth daily.     . Simethicone (GAS-X PO) Take 1 tablet by mouth daily as needed (flatulence). Reported on 09/01/2015    . Wheat Dextrin (BENEFIBER PO) Take 1 tablet by mouth daily.     No facility-administered medications prior to visit.     ROS Review of Systems  Constitutional: Negative for activity change, appetite change, chills, fatigue and unexpected weight change.  HENT: Positive for congestion and rhinorrhea. Negative for mouth sores and sinus pressure.   Eyes: Negative for visual disturbance.  Respiratory: Positive for cough. Negative for chest tightness and shortness of breath.     Gastrointestinal: Negative for abdominal pain and nausea.  Genitourinary: Negative for difficulty urinating, frequency and vaginal pain.  Musculoskeletal: Negative for back pain and gait problem.  Skin: Negative for pallor and rash.  Neurological: Negative for dizziness, tremors, weakness, numbness and headaches.  Psychiatric/Behavioral: Negative for confusion and sleep disturbance.    Objective:  BP 122/68 (BP Location: Left Arm, Patient Position: Sitting, Cuff Size: Normal)   Pulse (!) 54   Temp 97.8 F (36.6 C) (Oral)   Ht 5' 3"  (1.6 m)   Wt 131 lb (59.4 kg)   SpO2 97%   BMI 23.21 kg/m   BP Readings from Last 3 Encounters:  09/22/17 122/68  06/22/17 128/72  06/10/17 132/64    Wt Readings from Last 3 Encounters:  09/22/17 131 lb (59.4 kg)  06/22/17 134 lb (60.8 kg)  06/10/17 134 lb (60.8 kg)    Physical Exam  Constitutional: She appears well-developed. No distress.  HENT:  Head: Normocephalic.  Right Ear: External ear normal.  Left Ear: External ear normal.  Nose: Nose normal.  Mouth/Throat: Oropharynx is clear and moist.  Eyes: Conjunctivae are normal. Pupils are equal, round, and reactive to light. Right eye exhibits no discharge. Left eye exhibits no discharge.  Neck: Normal range of motion. Neck supple. No JVD present. No tracheal deviation present. No thyromegaly present.  Cardiovascular: Normal rate, regular rhythm and normal heart sounds.  Pulmonary/Chest: No stridor. No respiratory  distress. She has no wheezes.  Abdominal: Soft. Bowel sounds are normal. She exhibits no distension and no mass. There is no tenderness. There is no rebound and no guarding.  Musculoskeletal: She exhibits no edema or tenderness.  Lymphadenopathy:    She has no cervical adenopathy.  Neurological: She displays normal reflexes. No cranial nerve deficit. She exhibits normal muscle tone. Coordination normal.  Skin: No rash noted. No erythema.  Psychiatric: She has a normal mood and  affect. Her behavior is normal. Judgment and thought content normal.  eryth nasal mucosa  Lab Results  Component Value Date   WBC 4.3 06/22/2017   HGB 12.9 06/22/2017   HCT 39.7 06/22/2017   PLT 224.0 06/22/2017   GLUCOSE 88 06/22/2017   CHOL 189 10/30/2014   TRIG 151.0 (H) 10/30/2014   HDL 41.30 10/30/2014   LDLDIRECT 178.0 09/24/2011   LDLCALC 118 (H) 10/30/2014   ALT 26 06/22/2017   AST 39 (H) 06/22/2017   NA 141 06/22/2017   K 4.7 06/22/2017   CL 107 06/22/2017   CREATININE 0.79 06/22/2017   BUN 13 06/22/2017   CO2 30 06/22/2017   TSH 3.13 06/22/2017   INR 1.7 10/18/2015    No results found.  Assessment & Plan:   There are no diagnoses linked to this encounter. I am having Alyssa Luna. Givans maintain her Cyanocobalamin (VITAMIN B-12 IJ), hydrocortisone, alum & mag hydroxide-simeth, Simethicone (GAS-X PO), denosumab, Rivaroxaban, amiodarone, famotidine, Cetirizine HCl (ZYRTEC PO), Gabapentin, and Wheat Dextrin (BENEFIBER PO).  No orders of the defined types were placed in this encounter.    Follow-up: No Follow-up on file.  Walker Kehr, MD

## 2017-09-28 LAB — HM DEXA SCAN

## 2017-10-12 ENCOUNTER — Ambulatory Visit (INDEPENDENT_AMBULATORY_CARE_PROVIDER_SITE_OTHER): Payer: Medicare Other | Admitting: *Deleted

## 2017-10-12 DIAGNOSIS — E538 Deficiency of other specified B group vitamins: Secondary | ICD-10-CM | POA: Diagnosis not present

## 2017-10-12 MED ORDER — CYANOCOBALAMIN 1000 MCG/ML IJ SOLN
1000.0000 ug | Freq: Once | INTRAMUSCULAR | Status: AC
  Administered 2017-10-12: 1000 ug via INTRAMUSCULAR

## 2017-10-12 NOTE — Progress Notes (Signed)
cya

## 2017-10-14 ENCOUNTER — Encounter: Payer: Self-pay | Admitting: Internal Medicine

## 2017-10-14 NOTE — Progress Notes (Signed)
Outside notes received. Information abstracted. Notes sent to scan.  

## 2017-10-15 ENCOUNTER — Ambulatory Visit (INDEPENDENT_AMBULATORY_CARE_PROVIDER_SITE_OTHER): Payer: Medicare Other | Admitting: Podiatry

## 2017-10-15 DIAGNOSIS — D689 Coagulation defect, unspecified: Secondary | ICD-10-CM | POA: Diagnosis not present

## 2017-10-15 DIAGNOSIS — M79609 Pain in unspecified limb: Secondary | ICD-10-CM

## 2017-10-15 DIAGNOSIS — B351 Tinea unguium: Secondary | ICD-10-CM | POA: Diagnosis not present

## 2017-10-15 NOTE — Progress Notes (Signed)
Subjective: 84 y.o. returns the office today for painful, elongated, thickened toenails which he cannot trim herself. Denies any redness or drainage around the nails. Denies any acute changes since last appointment and no new complaints today. Denies any systemic complaints such as fevers, chills, nausea, vomiting.   PCP: Alyssa Luna, Alyssa Lacks, MD  She is on Xarelto   Objective: AAO 3, NAD DP/PT pulses palpable, CRT less than 3 seconds Nails hypertrophic, dystrophic, elongated, brittle, discolored 10. There is tenderness overlying the nails 1-5 bilaterally. There is no surrounding erythema or drainage along the nail sites. No open lesions or pre-ulcerative lesions are identified. No other areas of tenderness bilateral lower extremities. No overlying edema, erythema, increased warmth. No pain with calf compression, swelling, warmth, erythema.  Assessment: Patient presents with symptomatic onychomycosis  Plan: -Treatment options including alternatives, risks, complications were discussed -Nails sharply debrided 10 without complication/bleeding. -Discussed daily foot inspection. If there are any changes, to call the office immediately.  -Follow-up in 3 months or sooner if any problems are to arise. In the meantime, encouraged to call the office with any questions, concerns, changes symptoms.  Alyssa Luna, DPM

## 2017-10-18 ENCOUNTER — Encounter: Payer: Self-pay | Admitting: Internal Medicine

## 2017-11-09 ENCOUNTER — Encounter: Payer: Self-pay | Admitting: Internal Medicine

## 2017-11-09 ENCOUNTER — Ambulatory Visit: Payer: Medicare Other | Admitting: Internal Medicine

## 2017-11-09 ENCOUNTER — Ambulatory Visit: Payer: Medicare Other

## 2017-11-09 VITALS — BP 108/60 | HR 74 | Ht 62.0 in | Wt 131.5 lb

## 2017-11-09 DIAGNOSIS — K219 Gastro-esophageal reflux disease without esophagitis: Secondary | ICD-10-CM

## 2017-11-09 DIAGNOSIS — Z91018 Allergy to other foods: Secondary | ICD-10-CM

## 2017-11-09 DIAGNOSIS — K5730 Diverticulosis of large intestine without perforation or abscess without bleeding: Secondary | ICD-10-CM | POA: Diagnosis not present

## 2017-11-09 DIAGNOSIS — R109 Unspecified abdominal pain: Secondary | ICD-10-CM

## 2017-11-09 DIAGNOSIS — R140 Abdominal distension (gaseous): Secondary | ICD-10-CM | POA: Diagnosis not present

## 2017-11-09 MED ORDER — FAMOTIDINE 20 MG PO TABS: 20.0000 mg | ORAL_TABLET | Freq: Two times a day (BID) | ORAL | 5 refills | Status: DC

## 2017-11-09 NOTE — Progress Notes (Signed)
   Subjective:    Patient ID: Alyssa Luna, female    DOB: Nov 17, 1933, 83 y.o.   MRN: [id number]  HPI Alyssa Luna is an 84 year old female with a history of GERD, hiatal hernia, esophageal dysmotility, severe colonic diverticulosis, alpha gal disorder, atrial fibrillation on Xarelto who is here for follow-up.  She was last seen in June 2018.  She reports she was formally diagnosed with alpha gal disease after seeing a specialist at [facility].  She was able to identify being bitten by a [place] tick several times in the last few years.  She has continued to strictly avoid meat products coming from "animals with 4 legs".  She is also strictly avoiding dairy products.  She is able to tolerate chicken and fish.  She states when she eats any meat products or dairy products she develops abdominal pain, more prevalent in the left abdomen, loose stools with urgency and abdominal bloating.  And since avoiding such foods her abdominal symptoms have improved dramatically.  She also will feel burning in her throat and heartburn if she eats the above-mentioned foods.  Bowel movements are mostly regular with Benefiber which she has found very helpful.  She still has intermittent episodes of mild left-sided abdominal discomfort and at times fecal urgency and bloating.  Again the symptoms are much improved with a dietary modifications.  She has continued Pepcid 20 mg twice daily.  Review of Systems As per HPI, otherwise negative  Current Medications, Allergies, Past Medical History, Past Surgical History, Family History and Social History were reviewed in [employer] record.     Objective:   Physical Exam BP 108/60   Pulse 74   Ht 5' 2"  (1.575 m)   Wt 131 lb 8 oz (59.6 kg)   BMI 24.05 kg/m  Constitutional: Well-developed and well-nourished. No distress. HEENT: Normocephalic and atraumatic. Conjunctivae are normal.  No scleral icterus. Neck: Neck supple. Trachea  midline. Cardiovascular: Normal rate, regular rhythm and intact distal pulses.  Pulmonary/chest: Effort normal and breath sounds normal. No wheezing, rales or rhonchi. Abdominal: Soft, nontender, nondistended. Bowel sounds active throughout. Extremities: no clubbing, cyanosis, or edema Neurological: Alert and oriented to person place and time. Skin: Skin is warm and dry. Psychiatric: Normal mood and affect. Behavior is normal.     Assessment & Plan:  84 year old female with a history of GERD, hiatal hernia, esophageal dysmotility, severe colonic diverticulosis, alpha gal disorder, atrial fibrillation on Xarelto who is here for follow-up.  1.  Left-sided abdominal pain/bloating --still intermittently an issue for her though certainly better since avoiding meat products after diagnosis of alpha gal.  I am suspicious for other additional food allergies which may be triggering some of her symptoms.  She will be referred to allergy for food allergy testing.  Also very likely related to known left-sided colonic diverticulosis.  I am suspicious for symptomatic diverticular disease rather than diverticulitis.  Previous CT scan was reassuring.  Continue Benefiber daily.  I am going to try her on align 1 capsule daily as a probiotic.  She will open the capsule and sprinkle contents as she has been told to avoid gelatin capsules with alpha gal.  2.  GERD/LPR --continue Pepcid 20 mg twice daily  40-monthfollow-up, sooner if needed 25 minutes spent with the patient today. Greater than 50% was spent in counseling and coordination of care with the patient

## 2017-11-09 NOTE — Patient Instructions (Signed)
You have been scheduled with [facility] for 12/01/17 with dr Donneta Romberg at 1:45pm. Hold your Pepcid 3 days prior to your appointment.  Address: [address], [place], [place] [address]  Phone: [phone]  We have given you samples of Align. This puts good bacteria back into your colon. You should take 1 capsule by mouth once daily. If this works well for you, it can be purchased over the counter.  We have sent  medications to your pharmacy for you to pick up at your convenience:  Normal BMI (Body Mass Index- based on height and weight) is between 23 and 30. Your BMI today is Body mass index is 24.05 kg/m. [name] Please consider follow up  regarding your BMI with your Primary Care Provider.

## 2017-11-12 ENCOUNTER — Ambulatory Visit (INDEPENDENT_AMBULATORY_CARE_PROVIDER_SITE_OTHER): Payer: Medicare Other | Admitting: Emergency Medicine

## 2017-11-12 DIAGNOSIS — E538 Deficiency of other specified B group vitamins: Secondary | ICD-10-CM | POA: Diagnosis not present

## 2017-11-12 MED ORDER — CYANOCOBALAMIN 1000 MCG/ML IJ SOLN
1000.0000 ug | Freq: Once | INTRAMUSCULAR | Status: AC
  Administered 2017-11-12: 1000 ug via INTRAMUSCULAR

## 2017-11-12 NOTE — Progress Notes (Signed)
Medical treatment/procedure(s) were performed by non-physician practitioner and as supervising physician I was immediately available for consultation/collaboration. I agree with above. Elizabeth A Crawford, MD  

## 2017-11-21 ENCOUNTER — Other Ambulatory Visit: Payer: Self-pay | Admitting: Internal Medicine

## 2017-11-22 ENCOUNTER — Encounter (HOSPITAL_COMMUNITY): Payer: Self-pay | Admitting: Emergency Medicine

## 2017-11-22 ENCOUNTER — Ambulatory Visit (HOSPITAL_COMMUNITY)
Admission: EM | Admit: 2017-11-22 | Discharge: 2017-11-22 | Disposition: A | Discharge status: Home / Self Care | Payer: Medicare Other | Attending: Family Medicine | Admitting: Family Medicine

## 2017-11-22 DIAGNOSIS — J069 Acute upper respiratory infection, unspecified: Secondary | ICD-10-CM

## 2017-11-22 LAB — POCT URINALYSIS DIP (DEVICE)
Bilirubin Urine: NEGATIVE
GLUCOSE, UA: NEGATIVE mg/dL
KETONES UR: NEGATIVE mg/dL
LEUKOCYTES UA: NEGATIVE
Nitrite: NEGATIVE
Protein, ur: NEGATIVE mg/dL
SPECIFIC GRAVITY, URINE: 1.015 (ref 1.005–1.030)
UROBILINOGEN UA: 0.2 mg/dL (ref 0.0–1.0)
pH: 8.5 — ABNORMAL HIGH (ref 5.0–8.0)

## 2017-11-22 MED ORDER — DOXYCYCLINE HYCLATE 100 MG PO TABS: 100.0000 mg | ORAL_TABLET | Freq: Two times a day (BID) | ORAL | 0 refills | Status: DC
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
## 2017-11-22 NOTE — ED Triage Notes (Signed)
Pt here with URI sx and sore throat

## 2017-11-22 NOTE — Discharge Instructions (Addendum)
Your urine does not show indications of kidney infection at this time. Push fluids to ensure adequate hydration and keep secretions thin.  Tylenol and/or ibuprofen as needed for pain or fevers.  Complete course of antibiotics.  Please follow up with your primary care provider for recheck of symptoms in the next week. If develop worsening of pain, weakness, fevers, cough, shortness of breath or otherwise worsening please return or go to the Er.

## 2017-11-24 ENCOUNTER — Telehealth: Payer: Self-pay | Admitting: Internal Medicine

## 2017-11-24 NOTE — Telephone Encounter (Signed)
Called patient regarding Prolia injection. Insurance has been verified and patient is responsible for a $65 copay at the time of the injection. Patient said that she would like to wait until she has recovered from the current illness that she has before having this injection. She will be here to see Dr Alain Marion on Friday and will discuss this with him then.

## 2017-11-26 ENCOUNTER — Encounter: Payer: Self-pay | Admitting: Internal Medicine

## 2017-11-26 ENCOUNTER — Ambulatory Visit: Payer: Medicare Other | Admitting: Internal Medicine

## 2017-11-26 DIAGNOSIS — I482 Chronic atrial fibrillation, unspecified: Secondary | ICD-10-CM

## 2017-11-26 DIAGNOSIS — J069 Acute upper respiratory infection, unspecified: Secondary | ICD-10-CM | POA: Diagnosis not present

## 2017-11-26 DIAGNOSIS — J309 Allergic rhinitis, unspecified: Secondary | ICD-10-CM | POA: Diagnosis not present

## 2017-11-26 MED ORDER — MONTELUKAST SODIUM 10 MG PO TABS: 10.0000 mg | ORAL_TABLET | Freq: Every day | ORAL | 11 refills | Status: DC

## 2017-11-26 NOTE — Assessment & Plan Note (Signed)
finising Doxy

## 2017-11-26 NOTE — Assessment & Plan Note (Signed)
Xarelto

## 2017-11-26 NOTE — Progress Notes (Signed)
Subjective:  Patient ID: CYNAI SKEENS, female    DOB: [birth year]  Age: 84 y.o. MRN: [id number]  CC: No chief complaint on file.   HPI REECE MCBROOM presents for allergies, URI - on Doxy. F/u GERD, A fib. Doxy caused nausea.  Outpatient Medications Prior to Visit  Medication Sig Dispense Refill  . Cetirizine HCl (ZYRTEC PO) Take by mouth.    . Cyanocobalamin (VITAMIN B-12 IJ) Inject as directed every 30 (thirty) days.    [name] denosumab (PROLIA) 60 MG/ML SOLN injection Inject 60 mg into the skin every 6 (six) months. Administer in upper arm, thigh, or abdomen Unable to tolerate oral meds Dx severe osteoporosis 1.8 mL 6  . diphenhydrAMINE HCl (BENADRYL ALLERGY CHILDRENS PO) Take by mouth at bedtime as needed. Takes at night when itching    . doxycycline (VIBRA-TABS) 100 MG tablet Take 1 tablet (100 mg total) by mouth 2 (two) times daily for 7 days. 14 tablet 0  . famotidine (PEPCID) 20 MG tablet Take 1 tablet (20 mg total) by mouth 2 (two) times daily. 60 tablet 5  . Gabapentin 300 MG/6ML SOLN Take 100 mg by mouth 2 (two) times daily as needed. (Patient taking differently: Take 5 mLs 2 (two) times daily as needed by mouth. ) 470 mL 3  . hydrocortisone 2.5 % cream Apply 1 application topically 3 (three) times daily as needed (itching).    [name] ibuprofen (ADVIL) 200 MG tablet Take 200 mg by mouth every 6 (six) hours as needed. Takes 1/2 tablet prn for headache    . Rivaroxaban (XARELTO) 15 MG TABS tablet Take 15 mg by mouth daily.     . Simethicone (GAS-X PO) Take 1 tablet by mouth daily as needed (flatulence). Reported on 09/01/2015    . Wheat Dextrin (BENEFIBER PO) Take 1 tablet by mouth daily.    [name] amiodarone (PACERONE) 200 MG tablet Take by mouth.    . famotidine (PEPCID) 20 MG tablet Take 1 tablet (20 mg total) by mouth 3 (three) times daily. 90 tablet 5   No facility-administered medications prior to visit.     ROS Review of Systems  Constitutional: Positive for chills and  fatigue. Negative for activity change, appetite change and unexpected weight change.  HENT: Positive for congestion, postnasal drip, rhinorrhea, sinus pressure, sinus pain, sore throat and voice change. Negative for mouth sores.   Eyes: Negative for visual disturbance.  Respiratory: Negative for cough and chest tightness.   Gastrointestinal: Negative for abdominal pain and nausea.  Genitourinary: Negative for difficulty urinating, frequency and vaginal pain.  Musculoskeletal: Positive for arthralgias and gait problem. Negative for back pain.  Skin: Negative for pallor and rash.  Neurological: Negative for dizziness, tremors, weakness, numbness and headaches.  Psychiatric/Behavioral: Negative for confusion and sleep disturbance.    Objective:  BP 110/68 (BP Location: Left Arm, Patient Position: Sitting, Cuff Size: Normal)   Pulse (!) 57   Temp 97.9 F (36.6 C) (Oral)   Ht 5' 2"  (1.575 m)   Wt 128 lb (58.1 kg)   SpO2 97%   BMI 23.41 kg/m   BP Readings from Last 3 Encounters:  11/26/17 110/68  11/22/17 (!) 151/74  11/09/17 108/60    Wt Readings from Last 3 Encounters:  11/26/17 128 lb (58.1 kg)  11/09/17 131 lb 8 oz (59.6 kg)  09/22/17 131 lb (59.4 kg)    Physical Exam  Constitutional: She appears well-developed. No distress.  HENT:  Head: Normocephalic.  Right Ear: External  ear normal.  Left Ear: External ear normal.  Mouth/Throat: Oropharynx is clear and moist.  Eyes: Pupils are equal, round, and reactive to light. Conjunctivae are normal. Right eye exhibits no discharge. Left eye exhibits no discharge.  Neck: Normal range of motion. Neck supple. No JVD present. No tracheal deviation present. No thyromegaly present.  Cardiovascular: Normal rate, regular rhythm and normal heart sounds.  Pulmonary/Chest: No stridor. No respiratory distress. She has no wheezes.  Abdominal: Soft. Bowel sounds are normal. She exhibits no distension and no mass. There is no tenderness. There is  no rebound and no guarding.  Musculoskeletal: She exhibits no edema or tenderness.  Lymphadenopathy:    She has no cervical adenopathy.  Neurological: She displays normal reflexes. No cranial nerve deficit. She exhibits normal muscle tone. Coordination normal.  Skin: No rash noted. No erythema.  Psychiatric: She has a normal mood and affect. Her behavior is normal. Judgment and thought content normal.  hoarse eryth nasal lining  Lab Results  Component Value Date   WBC 4.3 06/22/2017   HGB 12.9 06/22/2017   HCT 39.7 06/22/2017   PLT 224.0 06/22/2017   GLUCOSE 88 06/22/2017   CHOL 189 10/30/2014   TRIG 151.0 (H) 10/30/2014   HDL 41.30 10/30/2014   LDLDIRECT 178.0 09/24/2011   LDLCALC 118 (H) 10/30/2014   ALT 26 06/22/2017   AST 39 (H) 06/22/2017   NA 141 06/22/2017   K 4.7 06/22/2017   CL 107 06/22/2017   CREATININE 0.79 06/22/2017   BUN 13 06/22/2017   CO2 30 06/22/2017   TSH 3.13 06/22/2017   INR 1.7 10/18/2015    No results found.  Assessment & Plan:   There are no diagnoses linked to this encounter. I am having Javier Glazier. Frasier maintain her Cyanocobalamin (VITAMIN B-12 IJ), hydrocortisone, Simethicone (GAS-X PO), denosumab, Rivaroxaban, amiodarone, Cetirizine HCl (ZYRTEC PO), Gabapentin, Wheat Dextrin (BENEFIBER PO), diphenhydrAMINE HCl (BENADRYL ALLERGY CHILDRENS PO), ibuprofen, famotidine, and doxycycline.  No orders of the defined types were placed in this encounter.    Follow-up: No follow-ups on file.  Walker Kehr, MD

## 2017-11-26 NOTE — Patient Instructions (Signed)
Try Afrin nasal spray x4-7 days

## 2017-11-26 NOTE — Assessment & Plan Note (Signed)
Worse finising Doxy Zyrtec Afin prn Flonase Add Singulair

## 2017-11-29 ENCOUNTER — Telehealth: Payer: Self-pay | Admitting: Internal Medicine

## 2017-11-29 DIAGNOSIS — R059 Cough, unspecified: Secondary | ICD-10-CM

## 2017-11-29 DIAGNOSIS — R05 Cough: Secondary | ICD-10-CM

## 2017-11-29 NOTE — Telephone Encounter (Signed)
Ok CXR Dx cough Thx

## 2017-11-29 NOTE — Telephone Encounter (Signed)
Please advise 

## 2017-11-29 NOTE — Telephone Encounter (Signed)
Copied from [place] [phone]. Topic: Inquiry >> Nov 29, 2017 12:35 PM Margot Ables wrote: Reason for CRM: Pt was seen 11/26/17 with Dr. Alain Marion. She has been using the Afrin nasal spray and montelukast. Pt states she is feeling worse and is concerned it is moving into her chest. She is worried about pneumonia and asking about getting a chest xray or antibiotics. Last night she coughed up green but today cannot get anything up. Pt requesting a call back.

## 2017-12-02 ENCOUNTER — Ambulatory Visit (INDEPENDENT_AMBULATORY_CARE_PROVIDER_SITE_OTHER)
Admission: RE | Admit: 2017-12-02 | Discharge: 2017-12-02 | Disposition: A | Discharge status: Home / Self Care | Payer: Medicare Other | Source: Ambulatory Visit | Attending: Internal Medicine | Admitting: Internal Medicine

## 2017-12-02 DIAGNOSIS — R059 Cough, unspecified: Secondary | ICD-10-CM

## 2017-12-02 DIAGNOSIS — R05 Cough: Secondary | ICD-10-CM | POA: Diagnosis not present

## 2017-12-02 NOTE — Telephone Encounter (Signed)
Can the chest xray be ordered.

## 2017-12-02 NOTE — Telephone Encounter (Signed)
Pt notified x-ray ordered

## 2017-12-02 NOTE — Addendum Note (Signed)
Addended by: Karren Cobble on: 12/02/2017 09:54 AM   Modules accepted: Orders

## 2017-12-04 ENCOUNTER — Encounter: Payer: Self-pay | Admitting: Internal Medicine

## 2017-12-06 ENCOUNTER — Encounter: Payer: Self-pay | Admitting: Nurse Practitioner

## 2017-12-06 ENCOUNTER — Ambulatory Visit: Payer: Medicare Other | Admitting: Nurse Practitioner

## 2017-12-06 VITALS — BP 132/68 | HR 63 | Temp 98.8°F | Resp 18 | Ht 62.0 in | Wt 127.0 lb

## 2017-12-06 DIAGNOSIS — R05 Cough: Secondary | ICD-10-CM

## 2017-12-06 DIAGNOSIS — R059 Cough, unspecified: Secondary | ICD-10-CM

## 2017-12-06 NOTE — Patient Instructions (Addendum)
For your cough, please start singulair. It should be safe to take the singulair with your amiodarone.  Please let us know if your cough continues with the singulair, we may need to send you to a pulmonologist for further evaluation.   Cough, Adult A cough helps to clear your throat and lungs. A cough may last only 2-3 weeks (acute), or it may last longer than 8 weeks (chronic). Many different things can cause a cough. A cough may be a sign of an illness or another medical condition. Follow these instructions at home:  Pay attention to any changes in your cough.  Take medicines only as told by your doctor. ? If you were prescribed an antibiotic medicine, take it as told by your doctor. Do not stop taking it even if you start to feel better. ? Talk with your doctor before you try using a cough medicine.  Drink enough fluid to keep your pee (urine) clear or pale yellow.  If the air is dry, use a cold steam vaporizer or humidifier in your home.  Stay away from things that make you cough at work or at home.  If your cough is worse at night, try using extra pillows to raise your head up higher while you sleep.  Do not smoke, and try not to be around smoke. If you need help quitting, ask your doctor.  Do not have caffeine.  Do not drink alcohol.  Rest as needed. Contact a doctor if:  You have new problems (symptoms).  You cough up yellow fluid (pus).  Your cough does not get better after 2-3 weeks, or your cough gets worse.  Medicine does not help your cough and you are not sleeping well.  You have pain that gets worse or pain that is not helped with medicine.  You have a fever.  You are losing weight and you do not know why.  You have night sweats. Get help right away if:  You cough up blood.  You have trouble breathing.  Your heartbeat is very fast. This information is not intended to replace advice given to you by your health care provider. Make sure you discuss any  questions you have with your health care provider. Document Released: 04/16/2011 Document Revised: 01/09/2016 Document Reviewed: 10/10/2014 Elsevier Interactive Patient Education  [employer].

## 2017-12-06 NOTE — Progress Notes (Signed)
Name: Alyssa Luna   MRN: [id number]    DOB: 22-Jun-1934   Date:12/06/2017       Progress Note  Subjective  Chief Complaint  Chief Complaint  Patient presents with  . Cough    cough for a week, still not feelin better,     HPI  Cough-  This is a new problem The cough began about 1 week ago- she describes as productive cough with white sputum, worse during the day. She feels her nose running and a dripping down the back of her throat which then makes her cough She was given course of doxycycline by [facility] about 2 weeks ago for URI which she completed as instructed. She then saw her PCP on 12/02/17 with the following CXR reading       IMPRESSION:      Mild bibasilar atelectasis. No edema or consolidation. Stable      cardiac silhouette. There is a hiatal hernia. There are wedge      compression fractures in the lower thoracic spine, stable. She was instructed to start Zyrtec, flonase, afrin and singulair. She did not notice any relief with the zytrec, afrin or flonase but has been taking claritin which seems to help some She did not start the montelukast due to concern that it may interact with her amiodarone  She c/o feeling "achy all over," ear pressure, sneezing. She denies fevers, shortness of breath, wheezing, heartburn, acid regurgitation, edema.   Patient Active Problem List   Diagnosis Date Noted  . Arthralgia 02/26/2017  . Dyspnea 01/27/2017  . Tick bite 01/27/2017  . Food allergy 01/27/2017  . Ingrown toenail 12/08/2016  . Acute upper respiratory infection 07/13/2016  . RMSF [facility] spotted fever) 04/29/2016  . 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 Onychomycosis 02/04/2016  . Lumbar radiculopathy 08/14/2015  . Lumbar scoliosis 08/14/2015  . Long term current use of anticoagulant therapy 07/18/2015  . Dysuria 06/16/2015  . Allergic rhinitis 06/13/2015  . Closed wedge compression fracture of fourth lumbar vertebra ([place])   . Thrush, oral 01/04/2015  .  Itching 01/04/2015  . Hypokalemia 11/06/2014  . Fatigue 10/19/2014  . LLQ abdominal pain 10/02/2014  . Swelling of left knee joint 10/02/2014  . Nausea without vomiting 10/02/2014  . DJD (degenerative joint disease) of knee 08/27/2014  . Primary localized osteoarthritis of left knee   . Breast cancer ([place])   . GERD (gastroesophageal reflux disease)   . Preop exam for internal medicine 06/13/2014  . Anxiety state 06/13/2014  . Bradycardia 11/09/2013  . Essential hypertension 11/09/2013  . Encounter for therapeutic drug monitoring 09/08/2013  . Well adult exam 05/21/2013  . Preop cardiovascular exam 05/08/2013  . Essential tremor 12/14/2012  . Right hip pain 05/10/2012  . Vertigo 03/09/2012  . Dyslipidemia 03/31/2011  . Meningioma ([place]) 02/03/2011  . TIA (transient ischemic attack) 11/13/2010  . Abnormal CT of brain 11/13/2010  . CAROTID BRUIT 07/28/2010  . Edema 05/02/2010  . Atrial fibrillation ([place]) 04/08/2010  . SYNCOPE 03/31/2010  . LOW BACK PAIN 06/05/2009  . Chronic maxillary sinusitis 01/31/2009  . EFFUSION OF JOINT OTHER SPECIFIED SITE 01/31/2009  . Diarrhea 05/31/2008  . ABNORMAL CHEST XRAY 05/15/2008  . HEMATURIA, MICROSCOPIC, HX OF 08/04/2007  . B12 deficiency 03/21/2007  . Vitamin D deficiency 03/21/2007  . Disease of tricuspid valve 03/21/2007  . DIVERTICULOSIS, COLON 03/21/2007  . Osteoarthritis 03/21/2007  . Osteoporosis 03/21/2007  . Personal history of malignant neoplasm of breast 03/21/2007  . COLONIC POLYPS,  HX OF 03/21/2007    Social History   Tobacco Use  . Smoking status: Never Smoker  . Smokeless tobacco: Never Used  Substance Use Topics  . Alcohol use: No    Alcohol/week: 0.0 oz     Current Outpatient Medications:  .  Cetirizine HCl (ZYRTEC PO), Take by mouth., Disp: , Rfl:  .  Cyanocobalamin (VITAMIN B-12 IJ), Inject as directed every 30 (thirty) days., Disp: , Rfl:  .  denosumab (PROLIA) 60 MG/ML SOLN injection, Inject 60 mg into the  skin every 6 (six) months. Administer in upper arm, thigh, or abdomen Unable to tolerate oral meds Dx severe osteoporosis, Disp: 1.8 mL, Rfl: 6 .  diphenhydrAMINE HCl (BENADRYL ALLERGY CHILDRENS PO), Take by mouth at bedtime as needed. Takes at night when itching, Disp: , Rfl:  .  famotidine (PEPCID) 20 MG tablet, Take 1 tablet (20 mg total) by mouth 2 (two) times daily., Disp: 60 tablet, Rfl: 5 .  Gabapentin 300 MG/6ML SOLN, Take 100 mg by mouth 2 (two) times daily as needed. (Patient taking differently: Take 5 mLs 2 (two) times daily as needed by mouth. ), Disp: 470 mL, Rfl: 3 .  hydrocortisone 2.5 % cream, Apply 1 application topically 3 (three) times daily as needed (itching)., Disp: , Rfl:  .  ibuprofen (ADVIL) 200 MG tablet, Take 200 mg by mouth every 6 (six) hours as needed. Takes 1/2 tablet prn for headache, Disp: , Rfl:  .  montelukast (SINGULAIR) 10 MG tablet, Take 1 tablet (10 mg total) by mouth daily., Disp: 30 tablet, Rfl: 11 .  Rivaroxaban (XARELTO) 15 MG TABS tablet, Take 15 mg by mouth daily. , Disp: , Rfl:  .  Simethicone (GAS-X PO), Take 1 tablet by mouth daily as needed (flatulence). Reported on 09/01/2015, Disp: , Rfl:  .  Wheat Dextrin (BENEFIBER PO), Take 1 tablet by mouth daily., Disp: , Rfl:  .  amiodarone (PACERONE) 200 MG tablet, Take by mouth., Disp: , Rfl:   Allergies  Allergen Reactions  . Pravastatin Anaphylaxis and Other (See Comments)    Legs ache  . Acetaminophen Other (See Comments)  . Colchicine Other (See Comments)  . Doxycycline     Nausea - able to take w/food  . Tikosyn [Dofetilide] Other (See Comments)    Unknown reaction and was told never to take it due to problems during sleep   . Actonel [Risedronate Sodium] Other (See Comments)    Not known  . Alendronate Other (See Comments)    NOT KNOWN  . Augmentin [Amoxicillin-Pot Clavulanate] Rash    Has patient had a PCN reaction causing immediate rash, facial/tongue/throat swelling, SOB or lightheadedness  with hypotension: Yes Has patient had a PCN reaction causing severe rash involving mucus membranes or skin necrosis:No Has patient had a PCN reaction that required hospitalization: No Has patient had a PCN reaction occurring within the last 10 years: No If all of the above answers are "NO", then may proceed with Cephalosporin use.   . Ciprofloxacin Nausea Only  . Evista [Raloxifene] Other (See Comments)    Unknown reaction  . Flagyl [Metronidazole] Other (See Comments)    Not known  . Fosamax [Alendronate Sodium] Other (See Comments)    NOT KNOWN  . Gold-Containing Drug Products Other (See Comments)    Per Dr  . Loma Sousa [Escitalopram Oxalate] Other (See Comments)    shaky  . Risedronate Other (See Comments)    Unknown reaction  . Simvastatin Other (See Comments)  REACTION: leg cramps, weakness  . Tramadol Other (See Comments)    Mental disturbance changes    ROS  No other specific complaints in a complete review of systems (except as listed in HPI above).  Objective  Vitals:   12/06/17 1033  BP: 132/68  Pulse: 63  Resp: 18  Temp: 98.8 F (37.1 C)  TempSrc: Oral  SpO2: 94%  Weight: 127 lb (57.6 kg)  Height: 5' 2"  (1.575 m)    Body mass index is 23.23 kg/m.  Nursing Note and Vital Signs reviewed.  Physical Exam  Constitutional: Patient appears well-developed and well-nourished. No distress. Ambulatory with walker. HEENT: head atraumatic, normocephalic, pupils equal and reactive to light, EOM's intact, TM's without erythema or bulging, no maxillary or frontal sinus tenderness , neck supple without lymphadenopathy, oropharynx pink and moist without exudate Cardiovascular: Normal rate, regular rhythm, S1/S2 present.  No BLE edema. Distal pulses intact Pulmonary/Chest: Effort normal and breath sounds clear. No respiratory distress or retractions. Skin: warm and dry. No rash noted. No erythema. Psychiatric: Patient has a normal mood and affect. behavior is normal.  Judgment and thought content normal.  Assessment & Plan  Cough Start singulair-dosing and side effects discussed Consider referral to pulmonology if cough perists F/u for new or worsening symptoms -Red flags and when to present for emergency care or RTC including fever >101.83F, chest pain, shortness of breath, new/worsening/un-resolving symptoms, reviewed with patient at time of visit. Follow up and care instructions discussed and provided in AVS.

## 2017-12-07 ENCOUNTER — Other Ambulatory Visit: Payer: Self-pay | Admitting: Internal Medicine

## 2017-12-09 ENCOUNTER — Encounter: Payer: Self-pay | Admitting: Internal Medicine

## 2017-12-10 ENCOUNTER — Other Ambulatory Visit: Payer: Self-pay | Admitting: Internal Medicine

## 2017-12-14 ENCOUNTER — Ambulatory Visit (INDEPENDENT_AMBULATORY_CARE_PROVIDER_SITE_OTHER): Payer: Medicare Other

## 2017-12-14 DIAGNOSIS — E538 Deficiency of other specified B group vitamins: Secondary | ICD-10-CM | POA: Diagnosis not present

## 2017-12-14 MED ORDER — CYANOCOBALAMIN 1000 MCG/ML IJ SOLN
1000.0000 ug | Freq: Once | INTRAMUSCULAR | Status: AC
  Administered 2017-12-14: 1000 ug via INTRAMUSCULAR

## 2017-12-31 ENCOUNTER — Telehealth: Payer: Self-pay

## 2017-12-31 NOTE — Telephone Encounter (Signed)
Patient having dental sx on June 6th---she will wait about 4 months and then resume prolia injections---pt to call back to reschedule--ok to transfer to tamara,RN at [facility] office

## 2018-01-17 ENCOUNTER — Ambulatory Visit: Payer: Medicare Other | Admitting: Podiatry

## 2018-01-17 ENCOUNTER — Encounter: Payer: Self-pay | Admitting: Podiatry

## 2018-01-17 DIAGNOSIS — D689 Coagulation defect, unspecified: Secondary | ICD-10-CM

## 2018-01-17 DIAGNOSIS — B351 Tinea unguium: Secondary | ICD-10-CM

## 2018-01-17 DIAGNOSIS — M79676 Pain in unspecified toe(s): Secondary | ICD-10-CM | POA: Diagnosis not present

## 2018-01-17 DIAGNOSIS — M79609 Pain in unspecified limb: Principal | ICD-10-CM

## 2018-01-18 ENCOUNTER — Telehealth: Payer: Self-pay

## 2018-01-18 ENCOUNTER — Other Ambulatory Visit: Payer: Self-pay

## 2018-01-18 ENCOUNTER — Ambulatory Visit (INDEPENDENT_AMBULATORY_CARE_PROVIDER_SITE_OTHER): Payer: Medicare Other

## 2018-01-18 DIAGNOSIS — E538 Deficiency of other specified B group vitamins: Secondary | ICD-10-CM

## 2018-01-18 MED ORDER — CYANOCOBALAMIN 1000 MCG/ML IJ SOLN
1000.0000 ug | Freq: Once | INTRAMUSCULAR | Status: AC
  Administered 2018-01-18: 1000 ug via INTRAMUSCULAR

## 2018-01-18 MED ORDER — ZOSTER VAC RECOMB ADJUVANTED 50 MCG/0.5ML IM SUSR: 0.5000 mL | Freq: Once | INTRAMUSCULAR | 1 refills | Status: AC

## 2018-01-19 NOTE — Progress Notes (Signed)
Subjective: 84 y.o. returns the office today for painful, elongated, thickened toenails which he cannot trim herself. Denies any redness or drainage around the nails. Denies any acute changes since last appointment and no new complaints today. Denies any systemic complaints such as fevers, chills, nausea, vomiting.   PCP: Plotnikov, Evie Lacks, MD  She is on Xarelto   Objective: AAO 3, NAD DP/PT pulses palpable, CRT less than 3 seconds Nails hypertrophic, dystrophic, elongated, brittle, discolored 10. There is tenderness overlying the nails 1-5 bilaterally. There is no surrounding erythema or drainage along the nail sites. No open lesions or pre-ulcerative lesions are identified. No other areas of tenderness bilateral lower extremities. No overlying edema, erythema, increased warmth. No pain with calf compression, swelling, warmth, erythema. No acute changes  Assessment: Patient presents with symptomatic onychomycosis  Plan: -Treatment options including alternatives, risks, complications were discussed -Nails sharply debrided 10 without complication/bleeding. -Discussed daily foot inspection. If there are any changes, to call the office immediately.  -Follow-up in 3 months or sooner if any problems are to arise. In the meantime, encouraged to call the office with any questions, concerns, changes symptoms.  Celesta Gentile, DPM

## 2018-01-20 MED FILL — SHINGRIX VIAL KIT: 50 | 1 days supply | Qty: 1 | Fill #0

## 2018-01-21 NOTE — Telephone Encounter (Signed)
error 

## 2018-02-01 ENCOUNTER — Telehealth: Payer: Self-pay | Admitting: Internal Medicine

## 2018-02-01 NOTE — Telephone Encounter (Signed)
Spoke with Alyssa Luna. They wanted to clarify that the patient is fine to have her procedure prior to the Prolia injection. Patient was given the injection back on 06/04/17. Per [name], the medication is no longer in your system after 6 months so the patient would be cleared for the procedure based on these findings. Alyssa Luna asked for written documentation stating this for her chart. Letter written waiting for physician signature and will be faxed over to their office as soon as possible.

## 2018-02-01 NOTE — Telephone Encounter (Signed)
Copied from [place] [id number]. Topic: Quick Communication - See Telephone Encounter >> Feb 01, 2018  2:51 PM Vernona Rieger wrote: CRM for notification. See Telephone encounter for: 02/01/18.   Marlowe Kays, from [place] would like a call back from [place] regarding the proila. Call back @ [phone]

## 2018-02-25 ENCOUNTER — Other Ambulatory Visit (INDEPENDENT_AMBULATORY_CARE_PROVIDER_SITE_OTHER): Payer: Medicare Other

## 2018-02-25 ENCOUNTER — Ambulatory Visit: Payer: Medicare Other | Admitting: Internal Medicine

## 2018-02-25 ENCOUNTER — Encounter: Payer: Self-pay | Admitting: Internal Medicine

## 2018-02-25 VITALS — BP 126/70 | HR 48 | Temp 97.9°F | Ht 62.0 in | Wt 128.0 lb

## 2018-02-25 DIAGNOSIS — I10 Essential (primary) hypertension: Secondary | ICD-10-CM

## 2018-02-25 DIAGNOSIS — E785 Hyperlipidemia, unspecified: Secondary | ICD-10-CM

## 2018-02-25 DIAGNOSIS — E538 Deficiency of other specified B group vitamins: Secondary | ICD-10-CM | POA: Diagnosis not present

## 2018-02-25 DIAGNOSIS — F411 Generalized anxiety disorder: Secondary | ICD-10-CM

## 2018-02-25 DIAGNOSIS — G8929 Other chronic pain: Secondary | ICD-10-CM

## 2018-02-25 DIAGNOSIS — I482 Chronic atrial fibrillation, unspecified: Secondary | ICD-10-CM

## 2018-02-25 DIAGNOSIS — M5440 Lumbago with sciatica, unspecified side: Secondary | ICD-10-CM | POA: Diagnosis not present

## 2018-02-25 DIAGNOSIS — G250 Essential tremor: Secondary | ICD-10-CM | POA: Diagnosis not present

## 2018-02-25 LAB — BASIC METABOLIC PANEL
BUN: 13 mg/dL (ref 6–23)
CHLORIDE: 106 meq/L (ref 96–112)
CO2: 28 mEq/L (ref 19–32)
CREATININE: 0.84 mg/dL (ref 0.40–1.20)
Calcium: 9 mg/dL (ref 8.4–10.5)
GFR: 68.91 mL/min (ref >60.00)
Glucose, Bld: 95 mg/dL (ref 70–99)
Potassium: 3.8 mEq/L (ref 3.5–5.1)
Sodium: 141 mEq/L (ref 135–145)

## 2018-02-25 LAB — LIPID PANEL
CHOLESTEROL: 160 mg/dL (ref 0–200)
HDL: 42.9 mg/dL (ref >39.00)
LDL CALC: 94 mg/dL (ref 0–99)
NonHDL: 117.46
TRIGLYCERIDES: 119 mg/dL (ref 0.0–149.0)
Total CHOL/HDL Ratio: 4
VLDL: 23.8 mg/dL (ref 0.0–40.0)

## 2018-02-25 MED ORDER — GABAPENTIN 300 MG/6ML PO SOLN: 6.0000 mL | Freq: Two times a day (BID) | ORAL | 5 refills | Status: DC | PRN

## 2018-02-25 MED ORDER — CYANOCOBALAMIN 1000 MCG/ML IJ SOLN
1000.0000 ug | Freq: Once | INTRAMUSCULAR | Status: AC
  Administered 2018-02-25: 1000 ug via INTRAMUSCULAR

## 2018-02-25 NOTE — Assessment & Plan Note (Signed)
Gabapentin should help

## 2018-02-25 NOTE — Progress Notes (Signed)
Subjective:  Patient ID: Alyssa Luna, female    DOB: [birth year]  Age: 84 y.o. MRN: [id number]  CC: No chief complaint on file.   HPI Alyssa Luna presents for meat allergy, osteoporosis  Outpatient Medications Prior to Visit  Medication Sig Dispense Refill  . Cetirizine HCl (ZYRTEC PO) Take by mouth.    . Cyanocobalamin (VITAMIN B-12 IJ) Inject as directed every 30 (thirty) days.    [name] denosumab (PROLIA) 60 MG/ML SOLN injection Inject 60 mg into the skin every 6 (six) months. Administer in upper arm, thigh, or abdomen Unable to tolerate oral meds Dx severe osteoporosis 1.8 mL 6  . diphenhydrAMINE HCl (BENADRYL ALLERGY CHILDRENS PO) Take by mouth at bedtime as needed. Takes at night when itching    . famotidine (PEPCID) 20 MG tablet Take 1 tablet (20 mg total) by mouth 2 (two) times daily. 60 tablet 5  . Gabapentin 300 MG/6ML SOLN Take 100 mg by mouth 2 (two) times daily as needed. (Patient taking differently: Take 5 mLs 2 (two) times daily as needed by mouth. ) 470 mL 3  . hydrocortisone 2.5 % cream Apply 1 application topically 3 (three) times daily as needed (itching).    . montelukast (SINGULAIR) 10 MG tablet Take 1 tablet (10 mg total) by mouth daily. 30 tablet 11  . Rivaroxaban (XARELTO) 15 MG TABS tablet Take 15 mg by mouth daily.     . Simethicone (GAS-X PO) Take 1 tablet by mouth daily as needed (flatulence). Reported on 09/01/2015    . Wheat Dextrin (BENEFIBER PO) Take 1 tablet by mouth daily.    [name] amiodarone (PACERONE) 200 MG tablet Take by mouth.    [name] ibuprofen (ADVIL) 200 MG tablet Take 200 mg by mouth every 6 (six) hours as needed. Takes 1/2 tablet prn for headache     No facility-administered medications prior to visit.     ROS: Review of Systems  Constitutional: Positive for fatigue. Negative for activity change, appetite change, chills and unexpected weight change.  HENT: Negative for congestion, mouth sores and sinus pressure.   Eyes: Negative for visual  disturbance.  Respiratory: Negative for cough and chest tightness.   Gastrointestinal: Negative for abdominal pain and nausea.  Genitourinary: Negative for difficulty urinating, frequency and vaginal pain.  Musculoskeletal: Positive for arthralgias. Negative for back pain and gait problem.  Skin: Negative for pallor and rash.  Neurological: Negative for dizziness, tremors, weakness, numbness and headaches.  Psychiatric/Behavioral: Negative for confusion and sleep disturbance. The patient is nervous/anxious.     Objective:  BP 126/70 (BP Location: Left Arm, Patient Position: Sitting, Cuff Size: Normal)   Pulse (!) 48   Temp 97.9 F (36.6 C) (Oral)   Ht 5' 2"  (1.575 m)   Wt 128 lb (58.1 kg)   SpO2 98%   BMI 23.41 kg/m   BP Readings from Last 3 Encounters:  02/25/18 126/70  12/06/17 132/68  11/26/17 110/68    Wt Readings from Last 3 Encounters:  02/25/18 128 lb (58.1 kg)  12/06/17 127 lb (57.6 kg)  11/26/17 128 lb (58.1 kg)    Physical Exam  Constitutional: She appears well-developed. No distress.  HENT:  Head: Normocephalic.  Right Ear: External ear normal.  Left Ear: External ear normal.  Nose: Nose normal.  Mouth/Throat: Oropharynx is clear and moist.  Eyes: Pupils are equal, round, and reactive to light. Conjunctivae are normal. Right eye exhibits no discharge. Left eye exhibits no discharge.  Neck: Normal range of  motion. Neck supple. No JVD present. No tracheal deviation present. No thyromegaly present.  Cardiovascular: Normal rate, regular rhythm and normal heart sounds.  Pulmonary/Chest: No stridor. No respiratory distress. She has no wheezes.  Abdominal: Soft. Bowel sounds are normal. She exhibits no distension and no mass. There is no tenderness. There is no rebound and no guarding.  Musculoskeletal: She exhibits tenderness. She exhibits no edema.  Lymphadenopathy:    She has no cervical adenopathy.  Neurological: She displays normal reflexes. No cranial nerve  deficit. She exhibits normal muscle tone. Coordination normal.  Skin: No rash noted. No erythema.  Psychiatric: She has a normal mood and affect. Her behavior is normal. Judgment and thought content normal.  tremor  Lab Results  Component Value Date   WBC 4.3 06/22/2017   HGB 12.9 06/22/2017   HCT 39.7 06/22/2017   PLT 224.0 06/22/2017   GLUCOSE 88 06/22/2017   CHOL 189 10/30/2014   TRIG 151.0 (H) 10/30/2014   HDL 41.30 10/30/2014   LDLDIRECT 178.0 09/24/2011   LDLCALC 118 (H) 10/30/2014   ALT 26 06/22/2017   AST 39 (H) 06/22/2017   NA 141 06/22/2017   K 4.7 06/22/2017   CL 107 06/22/2017   CREATININE 0.79 06/22/2017   BUN 13 06/22/2017   CO2 30 06/22/2017   TSH 3.13 06/22/2017   INR 1.7 10/18/2015    Dg Chest 2 View  Result Date: 12/02/2017 CLINICAL DATA:  Cough and congestion with fever. History of breast carcinoma EXAM: CHEST - 2 VIEW COMPARISON:  Chest radiograph August 06, 2016 and chest CT September 24, 2016 FINDINGS: There is mild bibasilar atelectasis. There is no edema or consolidation. Heart size and pulmonary vascularity are normal. There is a focal hiatal hernia. There is postoperative change in the left axillary region. There are breast implants bilaterally. There are compression fractures in the lower thoracic spine, stable. IMPRESSION: Mild bibasilar atelectasis. No edema or consolidation. Stable cardiac silhouette. There is a hiatal hernia. There are wedge compression fractures in the lower thoracic spine, stable. Electronically Signed   By: Lowella Grip III M.D.   On: 12/02/2017 16:15    Assessment & Plan:   There are no diagnoses linked to this encounter.   No orders of the defined types were placed in this encounter.    Follow-up: No follow-ups on file.  Walker Kehr, MD

## 2018-02-25 NOTE — Assessment & Plan Note (Signed)
Low dose Clonazepam  Potential benefits of a long term benzodiazepines  use as well as potential risks  and complications were explained to the patient and were aknowledged.

## 2018-02-25 NOTE — Assessment & Plan Note (Addendum)
Seeing doctor at [facility] - Gabapentin po

## 2018-02-25 NOTE — Assessment & Plan Note (Signed)
Xarelto

## 2018-02-25 NOTE — Addendum Note (Signed)
Addended by: Karren Cobble on: 02/25/2018 11:13 AM   Modules accepted: Orders

## 2018-02-25 NOTE — Assessment & Plan Note (Signed)
B12 inj

## 2018-02-25 NOTE — Assessment & Plan Note (Addendum)
BP Readings from Last 3 Encounters:  02/25/18 126/70  12/06/17 132/68  11/26/17 110/68   amlodipine

## 2018-03-04 ENCOUNTER — Emergency Department (HOSPITAL_COMMUNITY)
Admission: EM | Admit: 2018-03-04 | Discharge: 2018-03-04 | Disposition: A | Discharge status: Home / Self Care | Payer: Medicare Other | Attending: Emergency Medicine | Admitting: Emergency Medicine

## 2018-03-04 ENCOUNTER — Encounter (HOSPITAL_COMMUNITY): Payer: Self-pay | Admitting: Emergency Medicine

## 2018-03-04 DIAGNOSIS — R197 Diarrhea, unspecified: Secondary | ICD-10-CM | POA: Diagnosis not present

## 2018-03-04 DIAGNOSIS — Z7901 Long term (current) use of anticoagulants: Secondary | ICD-10-CM | POA: Diagnosis not present

## 2018-03-04 DIAGNOSIS — Z853 Personal history of malignant neoplasm of breast: Secondary | ICD-10-CM | POA: Insufficient documentation

## 2018-03-04 DIAGNOSIS — I2510 Atherosclerotic heart disease of native coronary artery without angina pectoris: Secondary | ICD-10-CM | POA: Insufficient documentation

## 2018-03-04 DIAGNOSIS — Z96653 Presence of artificial knee joint, bilateral: Secondary | ICD-10-CM | POA: Diagnosis not present

## 2018-03-04 DIAGNOSIS — K921 Melena: Secondary | ICD-10-CM | POA: Insufficient documentation

## 2018-03-04 DIAGNOSIS — I10 Essential (primary) hypertension: Secondary | ICD-10-CM | POA: Diagnosis not present

## 2018-03-04 DIAGNOSIS — Z8673 Personal history of transient ischemic attack (TIA), and cerebral infarction without residual deficits: Secondary | ICD-10-CM | POA: Insufficient documentation

## 2018-03-04 DIAGNOSIS — K625 Hemorrhage of anus and rectum: Secondary | ICD-10-CM | POA: Diagnosis present

## 2018-03-04 DIAGNOSIS — Z79899 Other long term (current) drug therapy: Secondary | ICD-10-CM | POA: Insufficient documentation

## 2018-03-04 LAB — COMPREHENSIVE METABOLIC PANEL
ALBUMIN: 3.8 g/dL (ref 3.5–5.0)
ALT: 33 U/L (ref 0–44)
AST: 48 U/L — AB (ref 15–41)
Alkaline Phosphatase: 77 U/L (ref 38–126)
Anion gap: 11 (ref 5–15)
BILIRUBIN TOTAL: 1.2 mg/dL (ref 0.3–1.2)
BUN: 11 mg/dL (ref 8–23)
CHLORIDE: 103 mmol/L (ref 98–111)
CO2: 25 mmol/L (ref 22–32)
CREATININE: 0.99 mg/dL (ref 0.44–1.00)
Calcium: 9.2 mg/dL (ref 8.9–10.3)
GFR calc Af Amer: 60 mL/min — ABNORMAL LOW (ref >60)
GFR calc non Af Amer: 52 mL/min — ABNORMAL LOW (ref >60)
GLUCOSE: 100 mg/dL — AB (ref 70–99)
Potassium: 3.8 mmol/L (ref 3.5–5.1)
Sodium: 139 mmol/L (ref 135–145)
Total Protein: 7.2 g/dL (ref 6.5–8.1)

## 2018-03-04 LAB — TYPE AND SCREEN
ABO/RH(D): AB POS
Antibody Screen: NEGATIVE

## 2018-03-04 LAB — CBC
HEMATOCRIT: 37.6 % (ref 36.0–46.0)
Hemoglobin: 12.3 g/dL (ref 12.0–15.0)
MCH: 30 pg (ref 26.0–34.0)
MCHC: 32.7 g/dL (ref 30.0–36.0)
MCV: 91.7 fL (ref 78.0–100.0)
Platelets: 210 10*3/uL (ref 150–400)
RBC: 4.1 MIL/uL (ref 3.87–5.11)
RDW: 13.6 % (ref 11.5–15.5)
WBC: 8.6 10*3/uL (ref 4.0–10.5)

## 2018-03-04 MED ORDER — LACTATED RINGERS IV BOLUS
1000.0000 mL | Freq: Once | INTRAVENOUS | Status: AC
  Administered 2018-03-04: 1000 mL via INTRAVENOUS

## 2018-03-04 NOTE — ED Notes (Addendum)
Pt attempted to provide stool but was unable to do so

## 2018-03-04 NOTE — ED Notes (Signed)
Assisted pt to bedside commode.

## 2018-03-04 NOTE — ED Notes (Signed)
Pt aware of need for stool sample

## 2018-03-04 NOTE — ED Triage Notes (Signed)
Patient complains of rectal bleeding and abdominal pain. States she was seen yesterday at an ED in [facility] for same, diagnosed with colitis after CT scan, was prescribed Augmentin and instructed to eat yogurt with active cultures to restore bacteria. Patient states she is unable to eat dairy products due to an allergy, complains of continued pain and abdominal pain. Patient alert and oriented and in no apparent distress at this time.

## 2018-03-04 NOTE — ED Notes (Addendum)
Pt states that she takes xarelto for afib

## 2018-03-04 NOTE — ED Provider Notes (Signed)
[facility] EMERGENCY DEPARTMENT Provider Note   CSN: [id number] Arrival date & time: 03/04/18  1103  History   Chief Complaint Chief Complaint  Patient presents with  . Rectal Bleeding   HPI  Patient is an 84 year old female with history of fibrillation on Xarelto, CAD, and hemorrhoids presenting to the ED for abdominal pain and bloody diarrhea. She states symptoms began last night with LLQ abdominal pain and diarrhea which has had bright red blood in it. Diarrhea has also had some mucoid quality. Pain is improved after bowel movements. No vomiting or dysuria. No recent travel. She has completed 2 courses of amoxicillin in the last several weeks for dental procedures. No other recent illness. She was seen at outside ED [facility] last night where CT showed descending colitis and sigmoid diverticulosis. She was started on Augmentin and discharged home. Today, her diarrhea has improved with last BM about 6 hours ago and it has remained bloody. Pain is unchanged. She states she presents today primarily because she was advised to be seen for recheck either with PCP or ED today, and her PCP was unavailable.  Past Medical History:  Diagnosis Date  . AF (atrial fibrillation) ([place])   . Anxiety    has lorazepam on hand for nervousness, pt. reports that she had a break-in to her home on 05/2014  . Atrial fibrillation ([place])    D Taylor  . Breast cancer ([place]) 1984  . Cataract   . Colon polyps   . Coronary artery disease   . Diverticulosis   . Diverticulosis of colon   . Dysrhythmia    atrial fib  . Esophageal dysmotility   . Familial tremor   . GERD (gastroesophageal reflux disease)   . Hemorrhoids   . Hiatal hernia   . History of breast cancer   . History of hiatal hernia   . Hyperlipidemia   . Hypertension   . Internal hemorrhoids   . LBP (low back pain)   . Meningioma ([place])   . Neuromuscular disorder ([place])    essential tremor  . Osteoarthritis    hands & back &  knees   . Osteoporosis   . Primary localized osteoarthritis of left knee   . Renal cyst   . [facility] spotted fever   . Schatzki's ring   . Scoliosis   . Stroke ([place])   . TIA (transient ischemic attack)   . TR (tricuspid regurgitation)    Mild  . Vitamin B12 deficiency   . Vitamin D deficiency     Patient Active Problem List   Diagnosis Date Noted  . Arthralgia 02/26/2017  . Dyspnea 01/27/2017  . Tick bite 01/27/2017  . Food allergy 01/27/2017  . Ingrown toenail 12/08/2016  . Acute upper respiratory infection 07/13/2016  . RMSF [facility] spotted fever) 04/29/2016  . 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 Onychomycosis 02/04/2016  . Lumbar radiculopathy 08/14/2015  . Lumbar scoliosis 08/14/2015  . Long term current use of anticoagulant therapy 07/18/2015  . Dysuria 06/16/2015  . Allergic rhinitis 06/13/2015  . Closed wedge compression fracture of fourth lumbar vertebra ([place])   . Thrush, oral 01/04/2015  . Itching 01/04/2015  . Hypokalemia 11/06/2014  . Fatigue 10/19/2014  . LLQ abdominal pain 10/02/2014  . Swelling of left knee joint 10/02/2014  . Nausea without vomiting 10/02/2014  . DJD (degenerative joint disease) of knee 08/27/2014  . Primary localized osteoarthritis of left knee   . Breast cancer ([place])   .  GERD (gastroesophageal reflux disease)   . Preop exam for internal medicine 06/13/2014  . Anxiety state 06/13/2014  . Bradycardia 11/09/2013  . Essential hypertension 11/09/2013  . Encounter for therapeutic drug monitoring 09/08/2013  . Well adult exam 05/21/2013  . Preop cardiovascular exam 05/08/2013  . Essential tremor 12/14/2012  . Right hip pain 05/10/2012  . Vertigo 03/09/2012  . Dyslipidemia 03/31/2011  . Meningioma ([place]) 02/03/2011  . TIA (transient ischemic attack) 11/13/2010  . Abnormal CT of brain 11/13/2010  . CAROTID BRUIT 07/28/2010  . Edema 05/02/2010  . Atrial fibrillation ([place]) 04/08/2010  . SYNCOPE 03/31/2010  . LOW BACK  PAIN 06/05/2009  . Chronic maxillary sinusitis 01/31/2009  . EFFUSION OF JOINT OTHER SPECIFIED SITE 01/31/2009  . Diarrhea 05/31/2008  . ABNORMAL CHEST XRAY 05/15/2008  . HEMATURIA, MICROSCOPIC, HX OF 08/04/2007  . B12 deficiency 03/21/2007  . Vitamin D deficiency 03/21/2007  . Disease of tricuspid valve 03/21/2007  . DIVERTICULOSIS, COLON 03/21/2007  . Osteoarthritis 03/21/2007  . Osteoporosis 03/21/2007  . Personal history of malignant neoplasm of breast 03/21/2007  . COLONIC POLYPS, HX OF 03/21/2007    Past Surgical History:  Procedure Laterality Date  . AUGMENTATION MAMMAPLASTY    . bilateral mastectomy    . BREAST ENHANCEMENT SURGERY    . BREAST SURGERY     Bilateral mastectomy  . COLONOSCOPY    . EYE SURGERY     cataracts removed- /w iol  & blepheroplasty  . JOINT REPLACEMENT Right   . kytoplasy    . MASTECTOMY  1984   bilateral  . OOPHORECTOMY     BSO  . POLYPECTOMY    . TOTAL KNEE ARTHROPLASTY  Dec 2011   Right - Dr Noemi Chapel  . TOTAL KNEE ARTHROPLASTY Left 08/27/2014   dr Noemi Chapel  . TOTAL KNEE ARTHROPLASTY Left 08/27/2014   Procedure: LEFT TOTAL KNEE ARTHROPLASTY;  Surgeon: Lorn Junes, MD;  Location: [facility];  Service: Orthopedics;  Laterality: Left;  [name] VAGINAL HYSTERECTOMY     LAVH BSO     OB History    Gravida  0   Para  0   Term  0   Preterm  0   AB  0   Living        SAB  0   TAB  0   Ectopic  0   Multiple      Live Births               Home Medications    Prior to Admission medications   Medication Sig Start Date End Date Taking? Authorizing Provider  amiodarone (PACERONE) 200 MG tablet Take 200 mg by mouth daily.  11/01/16 03/04/18 Yes [provider]  amLODipine (NORVASC) 5 MG tablet Take 5 mg by mouth daily. 02/20/18  Yes [provider]  amoxicillin (AMOXIL) 500 MG capsule Take 2,000 mg by mouth See admin instructions. 1 hour prior to dental procedures 02/28/18  Yes [provider]  cetirizine  (ZYRTEC) 10 MG tablet Take 10 mg by mouth daily as needed for allergies.   Yes [provider]  Cyanocobalamin (VITAMIN B-12 IJ) Inject as directed every 30 (thirty) days.   Yes [provider]  denosumab (PROLIA) 60 MG/ML SOLN injection Inject 60 mg into the skin every 6 (six) months. Administer in upper arm, thigh, or abdomen Unable to tolerate oral meds Dx severe osteoporosis 09/18/15  Yes Plotnikov, Evie Lacks, MD  diphenhydrAMINE HCl (BENADRYL ALLERGY CHILDRENS PO) Take by mouth at  bedtime as needed. Takes at night when itching   Yes [provider]  famotidine (PEPCID) 20 MG tablet Take 1 tablet (20 mg total) by mouth 2 (two) times daily. 11/09/17  Yes Pyrtle, Lajuan Lines, MD  gabapentin (NEURONTIN) 250 MG/5ML solution Take 100-300 mg by mouth See admin instructions. 356m twice daily as needed for pain. May take an additional 1060mat bedtime   Yes [provider]  hydrocortisone 2.5 % cream Apply 1 application topically 3 (three) times daily as needed (itching).   Yes [provider]  montelukast (SINGULAIR) 10 MG tablet Take 1 tablet (10 mg total) by mouth daily. 11/26/17 11/26/18 Yes Plotnikov, AlEvie LacksMD  Rivaroxaban (XARELTO) 15 MG TABS tablet Take 15 mg by mouth daily.  10/21/15  Yes [provider]  Simethicone (GAS-X PO) Take 1 tablet by mouth daily as needed (flatulence).    Yes [provider]  Wheat Dextrin (BENEFIBER PO) Take 1 tablet by mouth daily.   Yes [provider]  amoxicillin-clavulanate (AUGMENTIN) 875-125 MG tablet Take 1 tablet by mouth 2 (two) times daily. For 7 days 03/04/18 03/14/18  [provider]  Gabapentin 300 MG/6ML SOLN Take 6 mLs by mouth 2 (two) times daily as needed. Take additional 2 ml at bedtime Patient not taking: Reported on 03/04/2018 02/25/18   Plotnikov, AlEvie LacksMD    Family History Family History  Problem Relation Age of Onset  . Breast cancer Mother [id number]. Tremor Mother   .  Tremor Brother   . Colon cancer Maternal Aunt   . Ovarian cancer Maternal Grandmother   . Early death Neg Hx   . Stroke Neg Hx     Social History Social History   Tobacco Use  . Smoking status: Never Smoker  . Smokeless tobacco: Never Used  Substance Use Topics  . Alcohol use: No    Alcohol/week: 0.0 oz  . Drug use: No     Allergies   Beef extract; Pravastatin; Acetaminophen; Colchicine; Doxycycline; Shellfish allergy; Tikosyn [dofetilide]; Actonel [risedronate sodium]; Alendronate; Augmentin [amoxicillin-pot clavulanate]; Ciprofloxacin; Evista [raloxifene]; Flagyl [metronidazole]; Fosamax [alendronate sodium]; Gold-containing drug products; Lexapro [escitalopram oxalate]; Risedronate; Simvastatin; and Tramadol   Review of Systems Review of Systems  Constitutional: Negative for fever.  HENT: Negative for congestion and sore throat.   Eyes: Negative for visual disturbance.  Respiratory: Negative for shortness of breath.   Cardiovascular: Negative for chest pain.  Gastrointestinal: Positive for abdominal pain, blood in stool and diarrhea. Negative for vomiting.  Genitourinary: Negative for dysuria.  Musculoskeletal: Negative for neck pain.  Neurological: Negative for syncope.  All other systems reviewed and are negative.    Physical Exam Updated Vital Signs BP (!) 126/48   Pulse (!) 56   Temp 97.8 F (36.6 C) (Oral)   Resp 13   Ht 5' 2"  (1.575 m)   Wt 58.1 kg (128 lb)   SpO2 94%   BMI 23.41 kg/m   Physical Exam  Constitutional: She is oriented to person, place, and time. No distress.  HENT:  Head: Normocephalic and atraumatic.  Mouth/Throat: Oropharynx is clear and moist.  Eyes: Pupils are equal, round, and reactive to light. Conjunctivae are normal.  Neck: Neck supple. No tracheal deviation present.  Cardiovascular: Normal rate, regular rhythm, normal heart sounds and intact distal pulses.  No murmur heard. Pulmonary/Chest: Effort normal and breath sounds  normal. No stridor. No respiratory distress. She has no wheezes. She has no rales.  Abdominal: Soft. She exhibits  no distension and no mass. There is tenderness (moderate, well-localized, LLQ). There is no guarding.  Musculoskeletal: She exhibits no edema or deformity.  Neurological: She is alert and oriented to person, place, and time.  Skin: Skin is warm and dry.  Psychiatric: She has a normal mood and affect. Her behavior is normal.  Nursing note and vitals reviewed.    ED Treatments / Results  Labs (all labs ordered are listed, but only abnormal results are displayed) Labs Reviewed  COMPREHENSIVE METABOLIC PANEL - Abnormal; Notable for the following components:      Result Value   Glucose, Bld 100 (*)    AST 48 (*)    GFR calc non Af Amer 52 (*)    GFR calc Af Amer 60 (*)    All other components within normal limits  GASTROINTESTINAL PANEL BY PCR, STOOL (REPLACES STOOL CULTURE)  CBC  TYPE AND SCREEN   Medications Ordered in ED Medications  lactated ringers bolus 1,000 mL (0 mLs Intravenous Stopped 03/04/18 1803)    Initial Impression / Assessment and Plan / ED Course  I have reviewed the triage vital signs and the nursing notes.  Pertinent labs & imaging results that were available during my care of the patient were reviewed by me and considered in my medical decision making (see chart for details).  Patient is an 84 year old female presenting to the ED for recheck of bloody diarrhea as above. Clinical picture was consistent with improving infectious colitis. Report of outside CT reviewed in Care Everywhere. Her symptoms have improved today and she had no bowel movement despite attempting to provide stool sample during her nine-hour ED stay here. Her vital signs remain normal. Screening labs were reassuring no significant change in her renal function. Hemoglobin remains normal. She is tolerating her PO antibiotic. Also considered C. difficile colitis given her recent anabolic  exposure but with improvement in symptoms today, felt less likely. Do not feel repeat imaging warranted. Doubt significant diverticular bleed. Will discharge home with PCP follow-up to consider further stool studies if symptoms do not improve.  Patient informed of all ED findings. I advised that she continue her antibiotic. Return precautions and follow up plan reviewed. All questions answered.   Final Clinical Impressions(s) / ED Diagnoses   Final diagnoses:  Hematochezia  Diarrhea of presumed infectious origin     Prescilla Sours, MD 03/04/18 2348    Tegeler, Gwenyth Allegra, MD 03/05/18 [id number]

## 2018-03-07 ENCOUNTER — Encounter (INDEPENDENT_AMBULATORY_CARE_PROVIDER_SITE_OTHER): Payer: Self-pay | Admitting: Specialist

## 2018-03-07 ENCOUNTER — Ambulatory Visit (INDEPENDENT_AMBULATORY_CARE_PROVIDER_SITE_OTHER): Payer: Medicare Other | Admitting: Specialist

## 2018-03-07 VITALS — BP 122/62 | HR 52 | Ht 62.0 in | Wt 128.0 lb

## 2018-03-07 DIAGNOSIS — M4156 Other secondary scoliosis, lumbar region: Secondary | ICD-10-CM | POA: Diagnosis not present

## 2018-03-07 DIAGNOSIS — M810 Age-related osteoporosis without current pathological fracture: Secondary | ICD-10-CM

## 2018-03-07 DIAGNOSIS — M5136 Other intervertebral disc degeneration, lumbar region: Secondary | ICD-10-CM | POA: Diagnosis not present

## 2018-03-07 DIAGNOSIS — M51369 Other intervertebral disc degeneration, lumbar region without mention of lumbar back pain or lower extremity pain: Secondary | ICD-10-CM

## 2018-03-07 MED ORDER — BACLOFEN 10 MG PO TABS: 10.0000 mg | ORAL_TABLET | Freq: Three times a day (TID) | ORAL | 0 refills | Status: DC

## 2018-03-07 MED ORDER — TRAMADOL HCL 50 MG PO TABS: 100.0000 mg | ORAL_TABLET | Freq: Four times a day (QID) | ORAL | 0 refills | Status: AC | PRN

## 2018-03-07 NOTE — Progress Notes (Signed)
Office Visit Note   Patient: Alyssa Luna           Date of Birth: 09/30/1933           MRN: [id number] Visit Date: 03/07/2018              Requested by: Cassandria Anger, MD [place], [place] [address] PCP: Cassandria Anger, MD   Assessment & Plan: Visit Diagnoses:  1. Age related osteoporosis, unspecified pathological fracture presence   2. Other secondary scoliosis, lumbar region      Plan:Avoid frequent bending and stooping  No lifting greater than 5-10 lbs. May use ice or moist heat for pain. Weight loss is of benefit. Handicap license is approved. No arthritis medication except tylenol Take tramadol 1-2 tablets every 6 hours as needed for pain. Go to [facility] to be fitted with a soft brace.   Follow-Up Instructions: Return in about 1 month (around 04/04/2018).   Orders:  No orders of the defined types were placed in this encounter.  No orders of the defined types were placed in this encounter.     Procedures: No procedures performed   Clinical Data: No additional findings.   Subjective: Chief Complaint  Patient presents with  . Lower Back - Pain    84 year old female with worsening back pain since starting amirodarone last year, she had colitis with bleeding per rectum last week. The bleeding has stopped, seen in the ER last week with  GI bleeding and a CT scan of the abdomen was done. She is having pian with bending and stooping and sitting for long periods. She is crooked in her spine and is squirming. Last night with pain even with lying in bed. She has tried aspercreame and tylenol. She has used a heating pad and read that she should not use a heating pain with the aspercreame. Not tried ice, The back pain has been fairly significant since last Thursday 4 days ago. It did get better on Saturday 2 days ago and then yesterday it is worse. She has trouble getting upright, once up she is pretty good. She has not grocery shopped, she can  walk and normally does not go alone shopping, her husband helps with shopping. The next day the back started hurting, she went to the ER Thursday with the BRBPR and spent 5 hours in the ER at [facility]. She was seen in [facility] Friday but was there about 4.5 hours. That Friday the testing did not show blood loss that was of concern. She is to call her primary care MD today. Saturday she was not as bad and yesterday,  9-10 the last day. Moving increased the pain. No falls or lifting recently. No leg pain numbness or tingling. The pain  Is transverse at the thoracolumbar junction. Does have pain with coughing or sneezing. Any stooping or bending and lifting is with excruciating pain. Does have a History  Of osteoporosis and sees Dr. Christen Bame for Prolia injections every 6 months now for the last 2 years. Last bone density done 3-4 months ago it had improved to -3.2 from -3.4   Review of Systems  Constitutional: Positive for activity change, appetite change, diaphoresis and unexpected weight change.  HENT: Negative for congestion, dental problem, drooling, ear discharge, ear pain, facial swelling, hearing loss, mouth sores, nosebleeds, postnasal drip, rhinorrhea, sinus pressure, sinus pain, sneezing, sore throat, tinnitus, trouble swallowing and voice change.   Eyes: Positive for visual disturbance.  Respiratory: Positive for shortness of breath. Negative for apnea, cough, choking, chest tightness, wheezing and stridor.   Cardiovascular: Positive for palpitations. Negative for chest pain and leg swelling.  Gastrointestinal: Positive for abdominal pain, blood in stool and diarrhea. Negative for abdominal distention and constipation.  Endocrine: Positive for polyuria. Negative for cold intolerance, heat intolerance, polydipsia and polyphagia.  Genitourinary: Positive for flank pain. Negative for difficulty urinating, dyspareunia, dysuria, enuresis, frequency, genital sores, hematuria and menstrual  problem.  Musculoskeletal: Positive for back pain. Negative for arthralgias, gait problem, joint swelling, myalgias, neck pain and neck stiffness.  Skin: Negative for color change, pallor, rash and wound.  Allergic/Immunologic: Positive for food allergies. Negative for environmental allergies.  Neurological: Negative for dizziness, tremors, seizures, syncope, facial asymmetry, speech difficulty, weakness, light-headedness, numbness and headaches.  Hematological: Negative for adenopathy. Does not bruise/bleed easily.  Psychiatric/Behavioral: Positive for sleep disturbance. Negative for agitation, behavioral problems, confusion, decreased concentration, dysphoric mood, hallucinations, self-injury and suicidal ideas. The patient is not nervous/anxious and is not hyperactive.      Objective: Vital Signs: BP 122/62   Pulse (!) 52   Ht 5' 2"  (1.575 m)   Wt 128 lb (58.1 kg)   BMI 23.41 kg/m   Physical Exam  Constitutional: She is oriented to person, place, and time. She appears well-developed and well-nourished.  HENT:  Head: Normocephalic and atraumatic.  Eyes: Pupils are equal, round, and reactive to light. EOM are normal.  Neck: Normal range of motion. Neck supple.  Pulmonary/Chest: Effort normal and breath sounds normal.  Abdominal: Soft. Bowel sounds are normal.  Neurological: She is alert and oriented to person, place, and time.  Skin: Skin is warm and dry.  Psychiatric: She has a normal mood and affect. Her behavior is normal. Judgment and thought content normal.    Back Exam   Tenderness  The patient is experiencing tenderness in the lumbar.  Range of Motion  Extension: abnormal  Flexion: abnormal  Lateral bend right: abnormal  Lateral bend left: abnormal  Rotation right: abnormal  Rotation left: abnormal   Muscle Strength  Right Quadriceps:  5/5  Left Quadriceps:  5/5  Right Hamstrings:  5/5  Left Hamstrings:  5/5   Tests  Straight leg raise right:  negative Straight leg raise left: negative  Reflexes  Patellar: normal Achilles: normal Babinski's sign: normal   Other  Toe walk: normal Heel walk: normal Sensation: normal Gait: normal  Erythema: no back redness Scars: absent      Specialty Comments:  No specialty comments available.  Imaging: No results found.   PMFS History: Patient Active Problem List   Diagnosis Date Noted  . Arthralgia 02/26/2017  . Dyspnea 01/27/2017  . Tick bite 01/27/2017  . Food allergy 01/27/2017  . Ingrown toenail 12/08/2016  . Acute upper respiratory infection 07/13/2016  . RMSF [facility] spotted fever) 04/29/2016  . 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 Onychomycosis 02/04/2016  . Lumbar radiculopathy 08/14/2015  . Lumbar scoliosis 08/14/2015  . Long term current use of anticoagulant therapy 07/18/2015  . Dysuria 06/16/2015  . Allergic rhinitis 06/13/2015  . Closed wedge compression fracture of fourth lumbar vertebra ([place])   . Thrush, oral 01/04/2015  . Itching 01/04/2015  . Hypokalemia 11/06/2014  . Fatigue 10/19/2014  . LLQ abdominal pain 10/02/2014  . Swelling of left knee joint 10/02/2014  . Nausea without vomiting 10/02/2014  . DJD (degenerative joint disease) of knee 08/27/2014  . Primary localized osteoarthritis of left knee   .  Breast cancer ([place])   . GERD (gastroesophageal reflux disease)   . Preop exam for internal medicine 06/13/2014  . Anxiety state 06/13/2014  . Bradycardia 11/09/2013  . Essential hypertension 11/09/2013  . Encounter for therapeutic drug monitoring 09/08/2013  . Well adult exam 05/21/2013  . Preop cardiovascular exam 05/08/2013  . Essential tremor 12/14/2012  . Right hip pain 05/10/2012  . Vertigo 03/09/2012  . Dyslipidemia 03/31/2011  . Meningioma ([place]) 02/03/2011  . TIA (transient ischemic attack) 11/13/2010  . Abnormal CT of brain 11/13/2010  . CAROTID BRUIT 07/28/2010  . Edema 05/02/2010  . Atrial fibrillation ([place])  04/08/2010  . SYNCOPE 03/31/2010  . LOW BACK PAIN 06/05/2009  . Chronic maxillary sinusitis 01/31/2009  . EFFUSION OF JOINT OTHER SPECIFIED SITE 01/31/2009  . Diarrhea 05/31/2008  . ABNORMAL CHEST XRAY 05/15/2008  . HEMATURIA, MICROSCOPIC, HX OF 08/04/2007  . B12 deficiency 03/21/2007  . Vitamin D deficiency 03/21/2007  . Disease of tricuspid valve 03/21/2007  . DIVERTICULOSIS, COLON 03/21/2007  . Osteoarthritis 03/21/2007  . Osteoporosis 03/21/2007  . Personal history of malignant neoplasm of breast 03/21/2007  . COLONIC POLYPS, HX OF 03/21/2007   Past Medical History:  Diagnosis Date  . AF (atrial fibrillation) ([place])   . Anxiety    has lorazepam on hand for nervousness, pt. reports that she had a break-in to her home on 05/2014  . Atrial fibrillation ([place])    D Taylor  . Breast cancer ([place]) 1984  . Cataract   . Colon polyps   . Coronary artery disease   . Diverticulosis   . Diverticulosis of colon   . Dysrhythmia    atrial fib  . Esophageal dysmotility   . Familial tremor   . GERD (gastroesophageal reflux disease)   . Hemorrhoids   . Hiatal hernia   . History of breast cancer   . History of hiatal hernia   . Hyperlipidemia   . Hypertension   . Internal hemorrhoids   . LBP (low back pain)   . Meningioma ([place])   . Neuromuscular disorder ([place])    essential tremor  . Osteoarthritis    hands & back & knees   . Osteoporosis   . Primary localized osteoarthritis of left knee   . Renal cyst   . [facility] spotted fever   . Schatzki's ring   . Scoliosis   . Stroke ([place])   . TIA (transient ischemic attack)   . TR (tricuspid regurgitation)    Mild  . Vitamin B12 deficiency   . Vitamin D deficiency     Family History  Problem Relation Age of Onset  . Breast cancer Mother 90  . Tremor Mother   . Tremor Brother   . Colon cancer Maternal Aunt   . Ovarian cancer Maternal Grandmother   . Early death Neg Hx   . Stroke Neg Hx     Past Surgical History:   Procedure Laterality Date  . AUGMENTATION MAMMAPLASTY    . bilateral mastectomy    . BREAST ENHANCEMENT SURGERY    . BREAST SURGERY     Bilateral mastectomy  . COLONOSCOPY    . EYE SURGERY     cataracts removed- /w iol  & blepheroplasty  . JOINT REPLACEMENT Right   . kytoplasy    . MASTECTOMY  1984   bilateral  . OOPHORECTOMY     BSO  . POLYPECTOMY    . TOTAL KNEE ARTHROPLASTY  Dec 2011   Right - Dr Noemi Chapel  .  TOTAL KNEE ARTHROPLASTY Left 08/27/2014   dr Noemi Chapel  . TOTAL KNEE ARTHROPLASTY Left 08/27/2014   Procedure: LEFT TOTAL KNEE ARTHROPLASTY;  Surgeon: Lorn Junes, MD;  Location: [facility];  Service: Orthopedics;  Laterality: Left;  [name] VAGINAL HYSTERECTOMY     LAVH BSO   Social History   Occupational History  . Occupation: Retired    Fish farm manager: RETIRED  . Occupation: potter  Tobacco Use  . Smoking status: Never Smoker  . Smokeless tobacco: Never Used  Substance and Sexual Activity  . Alcohol use: No    Alcohol/week: 0.0 oz  . Drug use: No  . Sexual activity: Never    Birth control/protection: Surgical, Post-menopausal    Comment: HYST

## 2018-03-07 NOTE — Patient Instructions (Addendum)
Avoid frequent bending and stooping  No lifting greater than 5-10 lbs. May use ice or moist heat for pain. Weight loss is of benefit. Handicap license is approved. No arthritis medication except tylenol Take tramadol 1-2 tablets every 6 hours as needed for pain. Go to [facility] to be fitted with a soft brace.

## 2018-03-08 ENCOUNTER — Telehealth (INDEPENDENT_AMBULATORY_CARE_PROVIDER_SITE_OTHER): Payer: Self-pay | Admitting: Specialist

## 2018-03-08 NOTE — Telephone Encounter (Signed)
I called and patient was concerned that she was going to run out of meds,  Advised that we she gets low to call her pharmacy and they will send [place] a request to refill. She states that she understands

## 2018-03-08 NOTE — Telephone Encounter (Signed)
Pt called left VM would like to request med for next appt

## 2018-03-14 ENCOUNTER — Encounter: Payer: Self-pay | Admitting: Internal Medicine

## 2018-03-14 ENCOUNTER — Ambulatory Visit: Payer: Medicare Other | Admitting: Internal Medicine

## 2018-03-14 ENCOUNTER — Other Ambulatory Visit: Payer: Medicare Other

## 2018-03-14 DIAGNOSIS — K529 Noninfective gastroenteritis and colitis, unspecified: Secondary | ICD-10-CM | POA: Insufficient documentation

## 2018-03-14 DIAGNOSIS — E559 Vitamin D deficiency, unspecified: Secondary | ICD-10-CM | POA: Diagnosis not present

## 2018-03-14 DIAGNOSIS — K5904 Chronic idiopathic constipation: Secondary | ICD-10-CM | POA: Diagnosis not present

## 2018-03-14 DIAGNOSIS — E538 Deficiency of other specified B group vitamins: Secondary | ICD-10-CM | POA: Diagnosis not present

## 2018-03-14 DIAGNOSIS — S32040S Wedge compression fracture of fourth lumbar vertebra, sequela: Secondary | ICD-10-CM

## 2018-03-14 DIAGNOSIS — K5900 Constipation, unspecified: Secondary | ICD-10-CM | POA: Insufficient documentation

## 2018-03-14 MED ORDER — PLECANATIDE 3 MG PO TABS: 3.0000 mg | ORAL_TABLET | Freq: Every day | ORAL | 11 refills | Status: DC

## 2018-03-14 NOTE — Assessment & Plan Note (Signed)
We discussed a [facility] ER f/u on 03/04/18 for abd pain and blood in stool sx's. She was dx'd w/colitis. Last colon 2013 Dr Hilarie Fredrickson. The pt went to [facility] ER w/diarrhea after that   CT IMPRESSION:  1. Colitis of the distal segment of the colon. No bowel obstruction. 2. Sigmoid diverticulosis. 3. Mild periportal edema. 4. Moderate-sized hiatal hernia.

## 2018-03-14 NOTE — Assessment & Plan Note (Signed)
Tramadol prn per Dr Louanne Skye

## 2018-03-14 NOTE — Assessment & Plan Note (Signed)
On Vit D 

## 2018-03-14 NOTE — Assessment & Plan Note (Signed)
Trulance

## 2018-03-14 NOTE — Assessment & Plan Note (Signed)
On B12 

## 2018-03-14 NOTE — Progress Notes (Signed)
Subjective:  Patient ID: Alyssa Luna, female    DOB: Dec 30, 1933  Age: 84 y.o. MRN: [id number]  CC: No chief complaint on file.   HPI Alyssa Luna presents for [facility] ER f/u on 03/04/18 for abd pain and blood in stool sx's. She was dx'd w/colitis. Last colon 2013 Dr Hilarie Fredrickson. The pt went to [facility] ER w/diarrhea C/o LBP - Tramadol  CT abd (03/04/18): 1. Colitis of the distal segment of the colon. No bowel obstruction. 2. Sigmoid diverticulosis. 3. Mild periportal edema. 4. Moderate-sized hiatal hernia.   Electronically Signed By: Gary Fleet M.D. On: 03/04/2018 03:27  Result Narrative  CLINICAL DATA:84 year old female with abdominal pain and diarrhea.  EXAM: CT ABDOMEN AND PELVIS WITH CONTRAST  TECHNIQUE: Multidetector CT imaging of the abdomen and pelvis was performed using the standard protocol following bolus administration of intravenous contrast.  CONTRAST:75 cc Omnipaque 300  COMPARISON:CT of the abdomen pelvis dated 09/24/2016  FINDINGS: Lower chest: Bibasilar dependent atelectasis/scarring. There is coronary vascular calcification.  No intra-abdominal free air or free fluid.  Hepatobiliary: The liver is unremarkable. There is mild periportal edema. Clinical correlation is recommended. Subcentimeter hepatic hypodense lesions are too small to characterize. The gallbladder is unremarkable.  Pancreas: Unremarkable. No pancreatic ductal dilatation or surrounding inflammatory changes.  Spleen: Normal in size without focal abnormality.  Adrenals/Urinary Tract: The adrenal glands are unremarkable. There is a 15 mm left renal upper pole cyst. There is no hydronephrosis on either side. There is symmetric enhancement and excretion of contrast by both kidneys. The visualized ureters and urinary bladder appear unremarkable.  Stomach/Bowel: There is sigmoid diverticulosis. There is inflammatory changes and thickening of the distal colon  involving the descending and sigmoid colon most consistent with colitis. There is moderate stool throughout the colon. There is no bowel obstruction. There is a moderate size hiatal hernia. The appendix is not visualized with certainty. No inflammatory changes identified in the right lower quadrant.  Vascular/Lymphatic: Mild aortoiliac atherosclerotic disease. The abdominal aorta and IVC are otherwise unremarkable. No portal venous gas. There is no adenopathy.  Reproductive: Hysterectomy.No pelvic mass.  Other: Bilateral breast implants partially visualized.  Musculoskeletal: Osteopenia with multilevel degenerative changes of the spine and scoliosis. Old T1 compression fracture with near complete loss of vertebral body height and vertebroplasty. This is similar to prior CT. Old T11 compression fracture. No acute osseous pathology.  Other Result Information  Interface, Rad Results In - 03/04/2018  3:29 AM EDT CLINICAL DATA:  84 year old female with abdominal pain and diarrhea.  EXAM: CT ABDOMEN AND PELVIS WITH CONTRAST  TECHNIQUE: Multidetector CT imaging of the abdomen and pelvis was performed using the standard protocol following bolus administration of intravenous contrast.  CONTRAST:  75 cc Omnipaque 300  COMPARISON:  CT of the abdomen pelvis dated 09/24/2016  FINDINGS: Lower chest: Bibasilar dependent atelectasis/scarring. There is coronary vascular calcification.  No intra-abdominal free air or free fluid.  Hepatobiliary: The liver is unremarkable. There is mild periportal edema. Clinical correlation is recommended. Subcentimeter hepatic hypodense lesions are too small to characterize. The gallbladder is unremarkable.  Pancreas: Unremarkable. No pancreatic ductal dilatation or surrounding inflammatory changes.  Spleen: Normal in size without focal abnormality.  Adrenals/Urinary Tract: The adrenal glands are unremarkable. There is a 15 mm left renal upper  pole cyst. There is no hydronephrosis on either side. There is symmetric enhancement and excretion of contrast by both kidneys. The visualized ureters and urinary bladder appear unremarkable.  Stomach/Bowel: There is sigmoid diverticulosis. There is inflammatory  changes and thickening of the distal colon involving the descending and sigmoid colon most consistent with colitis. There is moderate stool throughout the colon. There is no bowel obstruction. There is a moderate size hiatal hernia. The appendix is not visualized with certainty. No inflammatory changes identified in the right lower quadrant.  Vascular/Lymphatic: Mild aortoiliac atherosclerotic disease. The abdominal aorta and IVC are otherwise unremarkable. No portal venous gas. There is no adenopathy.  Reproductive: Hysterectomy.  No pelvic mass.  Other: Bilateral breast implants partially visualized.  Musculoskeletal: Osteopenia with multilevel degenerative changes of the spine and scoliosis. Old T1 compression fracture with near complete loss of vertebral body height and vertebroplasty. This is similar to prior CT. Old T11 compression fracture. No acute osseous pathology.  IMPRESSION: 1. Colitis of the distal segment of the colon. No bowel obstruction. 2. Sigmoid diverticulosis. 3. Mild periportal edema. 4. Moderate-sized hiatal hernia.   Electronically Signed   By: Anner Crete M.D.   On: 03/04/2018 03:27     Outpatient Medications Prior to Visit  Medication Sig Dispense Refill  . amLODipine (NORVASC) 5 MG tablet Take 5 mg by mouth daily.  11  . amoxicillin (AMOXIL) 500 MG capsule Take 2,000 mg by mouth See admin instructions. 1 hour prior to dental procedures  0  . amoxicillin-clavulanate (AUGMENTIN) 875-125 MG tablet Take 1 tablet by mouth 2 (two) times daily. For 7 days    . baclofen (LIORESAL) 10 MG tablet Take 1 tablet (10 mg total) by mouth 3 (three) times daily. 30 each 0  . cetirizine (ZYRTEC) 10  MG tablet Take 10 mg by mouth daily as needed for allergies.    . Cyanocobalamin (VITAMIN B-12 IJ) Inject as directed every 30 (thirty) days.    [name] denosumab (PROLIA) 60 MG/ML SOLN injection Inject 60 mg into the skin every 6 (six) months. Administer in upper arm, thigh, or abdomen Unable to tolerate oral meds Dx severe osteoporosis 1.8 mL 6  . diphenhydrAMINE HCl (BENADRYL ALLERGY CHILDRENS PO) Take by mouth at bedtime as needed. Takes at night when itching    . famotidine (PEPCID) 20 MG tablet Take 1 tablet (20 mg total) by mouth 2 (two) times daily. 60 tablet 5  . gabapentin (NEURONTIN) 250 MG/5ML solution Take 100-300 mg by mouth See admin instructions. 354m twice daily as needed for pain. May take an additional 1058mat bedtime    . Gabapentin 300 MG/6ML SOLN Take 6 mLs by mouth 2 (two) times daily as needed. Take additional 2 ml at bedtime 470 mL 5  . hydrocortisone 2.5 % cream Apply 1 application topically 3 (three) times daily as needed (itching).    . montelukast (SINGULAIR) 10 MG tablet Take 1 tablet (10 mg total) by mouth daily. 30 tablet 11  . Rivaroxaban (XARELTO) 15 MG TABS tablet Take 15 mg by mouth daily.     . Simethicone (GAS-X PO) Take 1 tablet by mouth daily as needed (flatulence).     . traMADol (ULTRAM) 50 MG tablet Take 2 tablets (100 mg total) by mouth every 6 (six) hours as needed for up to 7 days for moderate pain. 40 tablet 0  . Wheat Dextrin (BENEFIBER PO) Take 1 tablet by mouth daily.    . Marland Kitchenmiodarone (PACERONE) 200 MG tablet Take 200 mg by mouth daily.      No facility-administered medications prior to visit.     ROS: Review of Systems  Constitutional: Positive for fatigue. Negative for activity change, appetite change, chills and unexpected  weight change.  HENT: Negative for congestion, mouth sores and sinus pressure.   Eyes: Negative for visual disturbance.  Respiratory: Negative for cough and chest tightness.   Gastrointestinal: Negative for abdominal pain  and nausea.  Genitourinary: Negative for difficulty urinating, frequency and vaginal pain.  Musculoskeletal: Positive for arthralgias and gait problem. Negative for back pain.  Skin: Negative for pallor and rash.  Neurological: Negative for dizziness, tremors, weakness, numbness and headaches.  Psychiatric/Behavioral: Negative for confusion and sleep disturbance.    Objective:  BP 126/70 (BP Location: Left Arm, Patient Position: Sitting, Cuff Size: Normal)   Pulse (!) 54   Temp 98.2 F (36.8 C) (Oral)   Ht 5' 2"  (1.575 m)   Wt 129 lb (58.5 kg)   SpO2 95%   BMI 23.59 kg/m   BP Readings from Last 3 Encounters:  03/14/18 126/70  03/07/18 122/62  03/04/18 (!) 126/48    Wt Readings from Last 3 Encounters:  03/14/18 129 lb (58.5 kg)  03/07/18 128 lb (58.1 kg)  03/04/18 128 lb (58.1 kg)    Physical Exam  Constitutional: She appears well-developed. No distress.  HENT:  Head: Normocephalic.  Right Ear: External ear normal.  Left Ear: External ear normal.  Nose: Nose normal.  Mouth/Throat: Oropharynx is clear and moist.  Eyes: Pupils are equal, round, and reactive to light. Conjunctivae are normal. Right eye exhibits no discharge. Left eye exhibits no discharge.  Neck: Normal range of motion. Neck supple. No JVD present. No tracheal deviation present. No thyromegaly present.  Cardiovascular: Normal rate, regular rhythm and normal heart sounds.  Pulmonary/Chest: No stridor. No respiratory distress. She has no wheezes.  Abdominal: Soft. Bowel sounds are normal. She exhibits no distension and no mass. There is no tenderness. There is no rebound and no guarding.  Musculoskeletal: She exhibits no edema or tenderness.  Lymphadenopathy:    She has no cervical adenopathy.  Neurological: She displays normal reflexes. No cranial nerve deficit. She exhibits normal muscle tone. Coordination normal.  Skin: No rash noted. No erythema.  Psychiatric: She has a normal mood and affect. Her  behavior is normal. Judgment and thought content normal.    Lab Results  Component Value Date   WBC 8.6 03/04/2018   HGB 12.3 03/04/2018   HCT 37.6 03/04/2018   PLT 210 03/04/2018   GLUCOSE 100 (H) 03/04/2018   CHOL 160 02/25/2018   TRIG 119.0 02/25/2018   HDL 42.90 02/25/2018   LDLDIRECT 178.0 09/24/2011   LDLCALC 94 02/25/2018   ALT 33 03/04/2018   AST 48 (H) 03/04/2018   NA 139 03/04/2018   K 3.8 03/04/2018   CL 103 03/04/2018   CREATININE 0.99 03/04/2018   BUN 11 03/04/2018   CO2 25 03/04/2018   TSH 3.13 06/22/2017   INR 1.7 10/18/2015    No results found.  Assessment & Plan:   There are no diagnoses linked to this encounter.   No orders of the defined types were placed in this encounter.    Follow-up: No follow-ups on file.  Walker Kehr, MD

## 2018-03-15 ENCOUNTER — Other Ambulatory Visit: Payer: Medicare Other

## 2018-03-15 DIAGNOSIS — K529 Noninfective gastroenteritis and colitis, unspecified: Secondary | ICD-10-CM

## 2018-03-17 LAB — CLOSTRIDIUM DIFFICILE EIA: C difficile Toxins A+B, EIA: NEGATIVE

## 2018-03-17 LAB — SPECIMEN STATUS REPORT

## 2018-03-25 ENCOUNTER — Ambulatory Visit: Payer: Medicare Other | Admitting: Internal Medicine

## 2018-03-25 ENCOUNTER — Encounter: Payer: Self-pay | Admitting: Physician Assistant

## 2018-03-25 ENCOUNTER — Ambulatory Visit: Payer: Medicare Other | Admitting: Physician Assistant

## 2018-03-25 VITALS — BP 128/76 | HR 68 | Ht 62.0 in | Wt 121.2 lb

## 2018-03-25 DIAGNOSIS — K50119 Crohn's disease of large intestine with unspecified complications: Secondary | ICD-10-CM

## 2018-03-25 DIAGNOSIS — K5730 Diverticulosis of large intestine without perforation or abscess without bleeding: Secondary | ICD-10-CM | POA: Diagnosis not present

## 2018-03-25 DIAGNOSIS — K219 Gastro-esophageal reflux disease without esophagitis: Secondary | ICD-10-CM | POA: Diagnosis not present

## 2018-03-25 DIAGNOSIS — K589 Irritable bowel syndrome without diarrhea: Secondary | ICD-10-CM

## 2018-03-25 MED FILL — SHINGRIX VIAL KIT: 50 | 1 days supply | Qty: 1 | Fill #1

## 2018-03-25 NOTE — Progress Notes (Addendum)
Subjective:    Patient ID: Alyssa Luna, female    DOB: [birth year], 84 y.o.   MRN: [id number]  HPI Alyssa Luna is a pleasant 84 year old white female, known to Dr. Hilarie Fredrickson.  She has history of atrial fibrillation, is on chronic Xarelto, history of TIAs, chronic GERD, hypertension, diverticulosis and history of adenomatous colon polyps. She was last seen in the office in March 2019 with complaints of left-sided abdominal bloating and discomfort.  Felt she may have symptomatic diverticular disease.  She is also been diagnosed with meat allergy secondary to alpha gal.  She says she feels much better over the past year since she has been completely avoiding all meat and meat products.  Her brain fog has cleared.  She occasionally still has some GI symptoms but those have improved as well. She comes in today after a recent ER visit 03/03/2018 to [facility] in [place] after she had developed acute severe sharp crampy abdominal pain followed by diarrhea and then diarrhea mixed with bright red blood.  She says she felt horrible at onset of symptoms with some diaphoresis, nausea and says she felt like she might "pass out". She was evaluated in the emergency room at [facility] and I did have CT of the abdomen and pelvis which showed an acute colitis of the descending colon and sigmoid colon mild sigmoid diverticulosis without diverticulitis, there was some mild periportal edema and a moderate hiatal hernia. Labs were reviewed from that ER visit WBC 8.6 hemoglobin 12.3.  She was given a prescription for Augmentin, and asked to follow-up with GI or her PCP.  Was also suggested she may want to be seen in the emergency room in [place].  She was evaluated in [facility] ER as well with no changes to regimen.  She saw Dr. Alain Marion on 03/14/2018.  At that time she was still having some diarrhea.  The rectal bleeding only really lasted for 1 day.  She was tested for C. difficile and this was negative.  Currently  her primary symptoms are mild nausea which she notices early in the mornings, she has not had any vomiting.  To needs to have some lower abdominal bloating.  She had one episode of diarrhea on Monday and Tuesday this week, no bowel movement Wednesday and then took a trulance sample and had a normal bowel movement today. She had also taken a fairly recent course of amoxicillin after some dental work.  Review of Systems Pertinent positive and negative review of systems were noted in the above HPI section.  All other review of systems was otherwise negative.  Outpatient Encounter Medications as of 03/25/2018  Medication Sig  . amLODipine (NORVASC) 5 MG tablet Take 5 mg by mouth daily.  [name] amoxicillin (AMOXIL) 500 MG capsule Take 2,000 mg by mouth See admin instructions. 1 hour prior to dental procedures  . baclofen (LIORESAL) 10 MG tablet Take 1 tablet (10 mg total) by mouth 3 (three) times daily.  . cetirizine (ZYRTEC) 10 MG tablet Take 10 mg by mouth daily as needed for allergies.  . Cholecalciferol (D3 DOTS) 2000 units TBDP Take by mouth daily.  . Cyanocobalamin (VITAMIN B-12 IJ) Inject as directed every 30 (thirty) days.  [name] denosumab (PROLIA) 60 MG/ML SOLN injection Inject 60 mg into the skin every 6 (six) months. Administer in upper arm, thigh, or abdomen Unable to tolerate oral meds Dx severe osteoporosis  . diphenhydrAMINE HCl (BENADRYL ALLERGY CHILDRENS PO) Take by mouth at bedtime as needed. Takes  at night when itching  . famotidine (PEPCID) 20 MG tablet Take 1 tablet (20 mg total) by mouth 2 (two) times daily.  [name] gabapentin (NEURONTIN) 250 MG/5ML solution Take 100-300 mg by mouth See admin instructions. 342m twice daily as needed for pain. May take an additional 103mat bedtime  . Gabapentin 300 MG/6ML SOLN Take 6 mLs by mouth 2 (two) times daily as needed. Take additional 2 ml at bedtime  . hydrocortisone 2.5 % cream Apply 1 application topically 3 (three) times daily as needed (itching).    . montelukast (SINGULAIR) 10 MG tablet Take 1 tablet (10 mg total) by mouth daily.  . Marland Kitchenlecanatide (TRULANCE) 3 MG TABS Take 3 mg by mouth daily. For constipation  . Rivaroxaban (XARELTO) 15 MG TABS tablet Take 15 mg by mouth daily.   . Simethicone (GAS-X PO) Take 1 tablet by mouth daily as needed (flatulence).   . Wheat Dextrin (BENEFIBER PO) Take 1 tablet by mouth daily.  . Marland Kitchenmiodarone (PACERONE) 200 MG tablet Take 200 mg by mouth daily.    No facility-administered encounter medications on file as of 03/25/2018.    Allergies  Allergen Reactions  . Beef Extract Other (See Comments)    Alpha-gal allergy testing positive June 2018  . Pravastatin Anaphylaxis and Other (See Comments)    Legs ache  . Acetaminophen Other (See Comments)  . Colchicine Other (See Comments)  . Doxycycline Other (See Comments)    Nausea - able to take w/food  . Shellfish Allergy Other (See Comments)    Unknown  . Tikosyn [Dofetilide] Other (See Comments)    Unknown reaction and was told never to take it due to problems during sleep   . Actonel [Risedronate Sodium] Other (See Comments)    Not known  . Alendronate Other (See Comments)    NOT KNOWN  . Augmentin [Amoxicillin-Pot Clavulanate] Rash    Has patient had a PCN reaction causing immediate rash, facial/tongue/throat swelling, SOB or lightheadedness with hypotension: Yes Has patient had a PCN reaction causing severe rash involving mucus membranes or skin necrosis:No Has patient had a PCN reaction that required hospitalization: No Has patient had a PCN reaction occurring within the last 10 years: No If all of the above answers are "NO", then may proceed with Cephalosporin use.   . Ciprofloxacin Nausea Only  . Evista [Raloxifene] Other (See Comments)    Unknown reaction  . Flagyl [Metronidazole] Other (See Comments)    Not known  . Fosamax [Alendronate Sodium] Other (See Comments)    NOT KNOWN  . Gold-Containing Drug Products Other (See Comments)     Per Dr  . LeLoma SousaEscitalopram Oxalate] Other (See Comments)    shaky  . Risedronate Other (See Comments)    Unknown reaction  . Simvastatin Other (See Comments)    REACTION: leg cramps, weakness  . Tramadol Other (See Comments)    Mental disturbance changes   Patient Active Problem List   Diagnosis Date Noted  . Colitis, acute 03/14/2018  . Constipation 03/14/2018  . Arthralgia 02/26/2017  . Dyspnea 01/27/2017  . Tick bite 01/27/2017  . Food allergy 01/27/2017  . Ingrown toenail 12/08/2016  . Acute upper respiratory infection 07/13/2016  . [facility] 04/29/2016  . 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 Onychomycosis 02/04/2016  . Lumbar radiculopathy 08/14/2015  . Lumbar scoliosis 08/14/2015  . Long term current use of anticoagulant therapy 07/18/2015  . Dysuria 06/16/2015  . Allergic rhinitis 06/13/2015  . Closed wedge compression  fracture of fourth lumbar vertebra ([place])   . Thrush, oral 01/04/2015  . Itching 01/04/2015  . Hypokalemia 11/06/2014  . Fatigue 10/19/2014  . LLQ abdominal pain 10/02/2014  . Swelling of left knee joint 10/02/2014  . Nausea without vomiting 10/02/2014  . DJD (degenerative joint disease) of knee 08/27/2014  . Primary localized osteoarthritis of left knee   . Breast cancer ([place])   . GERD (gastroesophageal reflux disease)   . Preop exam for internal medicine 06/13/2014  . Anxiety state 06/13/2014  . Bradycardia 11/09/2013  . Essential hypertension 11/09/2013  . Encounter for therapeutic drug monitoring 09/08/2013  . Well adult exam 05/21/2013  . Preop cardiovascular exam 05/08/2013  . Essential tremor 12/14/2012  . Right hip pain 05/10/2012  . Vertigo 03/09/2012  . Dyslipidemia 03/31/2011  . Meningioma ([place]) 02/03/2011  . TIA (transient ischemic attack) 11/13/2010  . Abnormal CT of brain 11/13/2010  . CAROTID BRUIT 07/28/2010  . Edema 05/02/2010  . Atrial fibrillation ([place]) 04/08/2010  . SYNCOPE 03/31/2010  .  LOW BACK PAIN 06/05/2009  . Chronic maxillary sinusitis 01/31/2009  . EFFUSION OF JOINT OTHER SPECIFIED SITE 01/31/2009  . Diarrhea 05/31/2008  . ABNORMAL CHEST XRAY 05/15/2008  . HEMATURIA, MICROSCOPIC, HX OF 08/04/2007  . B12 deficiency 03/21/2007  . Vitamin D deficiency 03/21/2007  . Disease of tricuspid valve 03/21/2007  . DIVERTICULOSIS, COLON 03/21/2007  . Osteoarthritis 03/21/2007  . Osteoporosis 03/21/2007  . Personal history of malignant neoplasm of breast 03/21/2007  . COLONIC POLYPS, HX OF 03/21/2007   Social History   Socioeconomic History  . Marital status: Married    Spouse name: Gwyndolyn Saxon  . Number of children: 0  . Years of education: College  . Highest education level: Not on file  Occupational History  . Occupation: Retired    Fish farm manager: RETIRED  . Occupation: Brewing technologist  Social Needs  . Financial resource strain: Not on file  . Food insecurity:    Worry: Not on file    Inability: Not on file  . Transportation needs:    Medical: Not on file    Non-medical: Not on file  Tobacco Use  . Smoking status: Never Smoker  . Smokeless tobacco: Never Used  Substance and Sexual Activity  . Alcohol use: No    Alcohol/week: 0.0 standard drinks  . Drug use: No  . Sexual activity: Never    Birth control/protection: Surgical, Post-menopausal    Comment: HYST  Lifestyle  . Physical activity:    Days per week: Not on file    Minutes per session: Not on file  . Stress: Not on file  Relationships  . Social connections:    Talks on phone: Not on file    Gets together: Not on file    Attends religious service: Not on file    Active member of club or organization: Not on file    Attends meetings of clubs or organizations: Not on file    Relationship status: Not on file  . Intimate partner violence:    Fear of current or ex partner: Not on file    Emotionally abused: Not on file    Physically abused: Not on file    Forced sexual activity: Not on file  Other Topics  Concern  . Not on file  Social History Narrative   ** Merged History Encounter **       Regular Exercise -  NO       Ms. Messenger's family history includes Breast cancer (age of  onset: 8) in her mother; Colon cancer in her maternal aunt; Ovarian cancer in her maternal grandmother; Tremor in her brother and mother.      Objective:    Vitals:   03/25/18 1040  BP: 128/76  Pulse: 68    Physical Exam; developed elderly white female in no acute distress, pleasant, frail-appearing ambulates with a walker.  Blood pressure 128/76 pulse 68, height 5 foot 2, weight 121 BMI 22.1.  HEENT; nontraumatic normocephalic EOMI PERRLA sclera anicteric, Buccal mucosa moist, Cardiovascular; regular rate and rhythm with S1-S2 no murmur rub or gallop, Pulmonary; clear bilaterally, Abdomen ;soft nontender nondistended bowel sounds are active there is no palpable mass or hepatosplenomegaly, Rectal ;exam not done, Extremities; no clubbing cyanosis or edema skin warm and dry, Neuro psych; alert and oriented, grossly nonfocal mood and affect appropriate       Assessment & Plan:   #99 84 year old white female with symptomatic diverticulosis, meat allergy secondary to Alpha gal complicated by GI times with exposure, and recent episode of what sounds like a segmental ischemic colitis on 03/03/2018.  She is for the most part recovered from the acute segmental colitis.  She had been given a course of Augmentin which she completed and also had another recent course of amoxicillin with dental work.  Diarrhea has resolved, no rectal bleeding after the first 24 hours.  She has some residual vague nausea and had loose stools 2 days this week, she has not had any ongoing diarrhea.  She continues to have lower abdominal bloating.  #2 chronic GERD-stable #3 history of atrial fibrillation-on Xarelto #4 history of adenomatous colon polyps #5 hypertension #6 history of TIA  Plan; Segmental ischemic colitis was discussed  with the patient, including natural course of healing.  I do not think she needs any additional treatment for this. She will continue Pepcid 20 mg p.o. twice daily Continue Benefiber 1 to 2 tablespoons daily and a large glass of water juice MiraLAX 17 g daily and 8 ounces of water on a as needed basis Stop Tru Lance-she was given samples by her PCP. We considered a trial of IBgard however this contains gelatin so she will not tolerate I am hopeful she will have continued improvement over the next several weeks after recent insult with segmental colitis and 2 courses of antibiotics. She will follow-up with Dr. Hilarie Fredrickson or myself on an as-needed basis.  Lynisha Osuch S Menelik Mcfarren PA-C 03/25/2018   Cc: Plotnikov, Evie Lacks, MD   Addendum: Reviewed and agree with management. Pyrtle, Lajuan Lines, MD

## 2018-03-25 NOTE — Patient Instructions (Addendum)
Take 1 serving of Benefiber daily. Take Miralax as needed.  17 grams in 8 oz of water.  Stop the Trulance.  Continue Pepcid 20 mg twice daily.   Follow up with our office as needed. Amy Esterwood PA or Dr. Zenovia Jarred.   Normal BMI (Body Mass Index- based on height and weight) is between 23 and 30. Your BMI today is Body mass index is 22.18 kg/m. [name] Please consider follow up  regarding your BMI with your Primary Care Provider.

## 2018-03-31 ENCOUNTER — Ambulatory Visit (INDEPENDENT_AMBULATORY_CARE_PROVIDER_SITE_OTHER): Payer: Medicare Other | Admitting: Specialist

## 2018-03-31 ENCOUNTER — Encounter (INDEPENDENT_AMBULATORY_CARE_PROVIDER_SITE_OTHER): Payer: Self-pay | Admitting: Specialist

## 2018-03-31 VITALS — BP 129/66 | HR 52 | Ht 62.0 in | Wt 128.0 lb

## 2018-03-31 DIAGNOSIS — M8000XD Age-related osteoporosis with current pathological fracture, unspecified site, subsequent encounter for fracture with routine healing: Secondary | ICD-10-CM

## 2018-03-31 DIAGNOSIS — M48061 Spinal stenosis, lumbar region without neurogenic claudication: Secondary | ICD-10-CM | POA: Diagnosis not present

## 2018-03-31 NOTE — Patient Instructions (Addendum)
Avoid frequent bending and stooping  No lifting greater than 10 lbs. May use ice or moist heat for pain. Weight loss is of benefit. Handicap license is approved.  Vitamin D 2,000 units per day Calcium 1500 mg per day, 274m is in each portion of dairy product per day.  Calcium citrate as opposed to calcium carbonate is better absorbed by our  Body, but since you have an allergy to many dairy products, you should take two supplements per day at least.    I think you are safe to travel this fall.
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
## 2018-04-11 ENCOUNTER — Encounter: Payer: Self-pay | Admitting: Internal Medicine

## 2018-04-11 ENCOUNTER — Ambulatory Visit: Payer: Medicare Other | Admitting: Internal Medicine

## 2018-04-11 DIAGNOSIS — E538 Deficiency of other specified B group vitamins: Secondary | ICD-10-CM | POA: Diagnosis not present

## 2018-04-11 DIAGNOSIS — I482 Chronic atrial fibrillation, unspecified: Secondary | ICD-10-CM

## 2018-04-11 DIAGNOSIS — R5382 Chronic fatigue, unspecified: Secondary | ICD-10-CM

## 2018-04-11 DIAGNOSIS — E559 Vitamin D deficiency, unspecified: Secondary | ICD-10-CM | POA: Diagnosis not present

## 2018-04-11 DIAGNOSIS — I10 Essential (primary) hypertension: Secondary | ICD-10-CM

## 2018-04-11 MED ORDER — CYANOCOBALAMIN 1000 MCG/ML IJ SOLN
1000.0000 ug | Freq: Once | INTRAMUSCULAR | Status: AC
  Administered 2018-04-11: 1000 ug via INTRAMUSCULAR

## 2018-04-11 NOTE — Assessment & Plan Note (Signed)
CFS - better

## 2018-04-11 NOTE — Addendum Note (Signed)
Addended by: Karren Cobble on: 04/11/2018 03:41 PM   Modules accepted: Orders

## 2018-04-11 NOTE — Assessment & Plan Note (Signed)
On vit D 

## 2018-04-11 NOTE — Assessment & Plan Note (Signed)
On B12 

## 2018-04-11 NOTE — Progress Notes (Signed)
Subjective:  Patient ID: Alyssa Luna, female    DOB: 25-Nov-1933  Age: 84 y.o. MRN: [id number]  CC: No chief complaint on file.   HPI Alyssa Luna presents for osteoporosis, HTN, B12 inj f/u C/o LBP and thor back pain - chronic compr fx per Dr Alyssa Luna Pt denies memory issues  Outpatient Medications Prior to Visit  Medication Sig Dispense Refill  . amiodarone (PACERONE) 200 MG tablet Take 200 mg by mouth daily.     [name] amLODipine (NORVASC) 5 MG tablet Take 5 mg by mouth daily.  11  . amoxicillin (AMOXIL) 500 MG capsule Take 2,000 mg by mouth See admin instructions. 1 hour prior to dental procedures  0  . baclofen (LIORESAL) 10 MG tablet Take 1 tablet (10 mg total) by mouth 3 (three) times daily. 30 each 0  . cetirizine (ZYRTEC) 10 MG tablet Take 10 mg by mouth daily as needed for allergies.    . Cholecalciferol (D3 DOTS) 2000 units TBDP Take by mouth daily.    . Cyanocobalamin (VITAMIN B-12 IJ) Inject as directed every 30 (thirty) days.    [name] denosumab (PROLIA) 60 MG/ML SOLN injection Inject 60 mg into the skin every 6 (six) months. Administer in upper arm, thigh, or abdomen Unable to tolerate oral meds Dx severe osteoporosis 1.8 mL 6  . diphenhydrAMINE HCl (BENADRYL ALLERGY CHILDRENS PO) Take by mouth at bedtime as needed. Takes at night when itching    . famotidine (PEPCID) 20 MG tablet Take 1 tablet (20 mg total) by mouth 2 (two) times daily. 60 tablet 5  . gabapentin (NEURONTIN) 250 MG/5ML solution Take 100-300 mg by mouth See admin instructions. 327m twice daily as needed for pain. May take an additional 1069mat bedtime    . Gabapentin 300 MG/6ML SOLN Take 6 mLs by mouth 2 (two) times daily as needed. Take additional 2 ml at bedtime 470 mL 5  . hydrocortisone 2.5 % cream Apply 1 application topically 3 (three) times daily as needed (itching).    . montelukast (SINGULAIR) 10 MG tablet Take 1 tablet (10 mg total) by mouth daily. 30 tablet 11  . Plecanatide (TRULANCE) 3 MG TABS  Take 3 mg by mouth daily. For constipation 30 tablet 11  . Rivaroxaban (XARELTO) 15 MG TABS tablet Take 15 mg by mouth daily.     . Simethicone (GAS-X PO) Take 1 tablet by mouth daily as needed (flatulence).     . [place] Dextrin (BENEFIBER PO) Take 1 tablet by mouth daily.     No facility-administered medications prior to visit.     ROS: Review of Systems  Constitutional: Positive for fatigue. Negative for activity change, appetite change, chills and unexpected weight change.  HENT: Negative for congestion, mouth sores and sinus pressure.   Eyes: Negative for visual disturbance.  Respiratory: Negative for cough and chest tightness.   Gastrointestinal: Negative for abdominal pain and nausea.  Genitourinary: Negative for difficulty urinating, frequency and vaginal pain.  Musculoskeletal: Positive for arthralgias, back pain and gait problem.  Skin: Negative for pallor and rash.  Neurological: Negative for dizziness, tremors, weakness, numbness and headaches.  Psychiatric/Behavioral: Negative for confusion and sleep disturbance. The patient is nervous/anxious.     Objective:  BP 128/76 (BP Location: Left Arm, Patient Position: Sitting, Cuff Size: Normal)   Pulse (!) 54   Temp 97.9 F (36.6 C) (Oral)   Ht 5' 2"  (1.575 m)   Wt 121 lb (54.9 kg)   SpO2 97%  BMI 22.13 kg/m   BP Readings from Last 3 Encounters:  04/11/18 128/76  03/31/18 129/66  03/25/18 128/76    Wt Readings from Last 3 Encounters:  04/11/18 121 lb (54.9 kg)  03/31/18 128 lb (58.1 kg)  03/25/18 121 lb 4 oz (55 kg)    Physical Exam  Constitutional: She appears well-developed. No distress.  HENT:  Head: Normocephalic.  Right Ear: External ear normal.  Left Ear: External ear normal.  Nose: Nose normal.  Mouth/Throat: Oropharynx is clear and moist.  Eyes: Pupils are equal, round, and reactive to light. Conjunctivae are normal. Right eye exhibits no discharge. Left eye exhibits no discharge.  Neck: Normal  range of motion. Neck supple. No JVD present. No tracheal deviation present. No thyromegaly present.  Cardiovascular: Normal rate, regular rhythm and normal heart sounds.  Pulmonary/Chest: No stridor. No respiratory distress. She has no wheezes.  Abdominal: Soft. Bowel sounds are normal. She exhibits no distension and no mass. There is no tenderness. There is no rebound and no guarding.  Musculoskeletal: She exhibits no edema or tenderness.  Lymphadenopathy:    She has no cervical adenopathy.  Neurological: She displays normal reflexes. No cranial nerve deficit. She exhibits normal muscle tone. Coordination normal.  Skin: No rash noted. No erythema.  Psychiatric: She has a normal mood and affect. Her behavior is normal. Judgment and thought content normal.  walker Painful spine   Lab Results  Component Value Date   WBC 8.6 03/04/2018   HGB 12.3 03/04/2018   HCT 37.6 03/04/2018   PLT 210 03/04/2018   GLUCOSE 100 (H) 03/04/2018   CHOL 160 02/25/2018   TRIG 119.0 02/25/2018   HDL 42.90 02/25/2018   LDLDIRECT 178.0 09/24/2011   LDLCALC 94 02/25/2018   ALT 33 03/04/2018   AST 48 (H) 03/04/2018   NA 139 03/04/2018   K 3.8 03/04/2018   CL 103 03/04/2018   CREATININE 0.99 03/04/2018   BUN 11 03/04/2018   CO2 25 03/04/2018   TSH 3.13 06/22/2017   INR 1.7 10/18/2015    No results found.  Assessment & Plan:   There are no diagnoses linked to this encounter.   No orders of the defined types were placed in this encounter.    Follow-up: No follow-ups on file.  Walker Kehr, MD

## 2018-04-11 NOTE — Assessment & Plan Note (Signed)
Xarelto Pacerone

## 2018-04-11 NOTE — Assessment & Plan Note (Signed)
Amlodipine.

## 2018-04-19 ENCOUNTER — Ambulatory Visit: Payer: Medicare Other | Admitting: Podiatry

## 2018-04-19 ENCOUNTER — Encounter: Payer: Self-pay | Admitting: Podiatry

## 2018-04-19 DIAGNOSIS — M79609 Pain in unspecified limb: Principal | ICD-10-CM

## 2018-04-19 DIAGNOSIS — D689 Coagulation defect, unspecified: Secondary | ICD-10-CM

## 2018-04-19 DIAGNOSIS — Q828 Other specified congenital malformations of skin: Secondary | ICD-10-CM

## 2018-04-19 DIAGNOSIS — M79676 Pain in unspecified toe(s): Secondary | ICD-10-CM | POA: Diagnosis not present

## 2018-04-19 DIAGNOSIS — L97311 Non-pressure chronic ulcer of right ankle limited to breakdown of skin: Secondary | ICD-10-CM

## 2018-04-19 DIAGNOSIS — B351 Tinea unguium: Secondary | ICD-10-CM

## 2018-04-19 MED ORDER — CLINDAMYCIN HCL 300 MG PO CAPS: 300.0000 mg | ORAL_CAPSULE | Freq: Three times a day (TID) | ORAL | 0 refills | Status: DC

## 2018-04-19 MED ORDER — MUPIROCIN 2 % EX OINT: 1.0000 "application " | TOPICAL_OINTMENT | Freq: Two times a day (BID) | CUTANEOUS | 2 refills | Status: DC

## 2018-04-20 ENCOUNTER — Telehealth: Payer: Self-pay | Admitting: Podiatry

## 2018-04-20 ENCOUNTER — Telehealth: Payer: Self-pay | Admitting: Internal Medicine

## 2018-04-20 MED ORDER — AMOXICILLIN-POT CLAVULANATE 875-125 MG PO TABS: 1.0000 | ORAL_TABLET | Freq: Two times a day (BID) | ORAL | 0 refills | Status: DC

## 2018-04-20 NOTE — Telephone Encounter (Signed)
I saw Dr. Jacqualyn Posey yesterday and he gave me a prescription for clindamycin in a capsule. I didn't think to tell him but I have this alpha-gal tick allergy and I'm allergic to bee's, pork, and anything that's a 4 legged animal. That means that I cannot take the clindamycin in a capsule. The pharmacist told me to take it out and put it in applesauce and my mouth has burned all day, so I only took 1 pill. I was wondering if there is something else I can take in a tablet form? I think I've taken Augmentin before for a dental procedure I had about 2 months ago. Also, I was recently diagnosed with a mild form of colitis. If you could please call me back at [phone] and I can better explain this to you. I will be in [facility] tomorrow afternoon for a dentist appointment and with the nurse at [facility] on [address]. Thank you.

## 2018-04-20 NOTE — Telephone Encounter (Signed)
Please advise 

## 2018-04-20 NOTE — Telephone Encounter (Signed)
What is Clindamycin for? Thx

## 2018-04-20 NOTE — Telephone Encounter (Signed)
Please order augmetin 864m BID x 10 days. Thanks.

## 2018-04-20 NOTE — Addendum Note (Signed)
Addended by: Harriett Sine D on: 04/20/2018 04:15 PM   Modules accepted: Orders

## 2018-04-20 NOTE — Telephone Encounter (Signed)
Copied from [place] [id number]. Topic: General - Other >> Apr 20, 2018 11:50 AM Ivar Drape wrote: Reason for CRM:   Patient has an appointment tomorrow for an Prolia injection, but she has started taking the antibiotic Clindamycin.  She wants to know if she should still come in for the Prolia Injection with taking Clindamycin.  Please call and let her know.

## 2018-04-20 NOTE — Telephone Encounter (Signed)
I informed pt of Dr. Leigh Aurora change of medication to Augmentin.

## 2018-04-21 ENCOUNTER — Ambulatory Visit: Payer: Medicare Other

## 2018-04-21 NOTE — Telephone Encounter (Signed)
I called pt- she states she is not actually taking the clindamycin, but it was prescribed by Dr. Jacqualyn Posey for a skin abrasion on her LE. She states the abx was changed to Augmentin which she has not started yet.   I spoke to Dr. Alain Marion- Pt informed He wants her to wait to receive Prolia injection 7 days after she completes Augmentin. 04/21/18 Prolia injection rescheduled to 05/09/18 @ 3:20. Pt is aware and verbalized understanding.

## 2018-04-22 ENCOUNTER — Ambulatory Visit: Payer: Medicare Other

## 2018-04-23 NOTE — Progress Notes (Signed)
Subjective: 84 y.o. returns the office today for painful, elongated, thickened toenails which he cannot trim herself. Denies any redness or drainage around the nails.  She also states that over the last 2 weeks she has developed a wound on the lateral ankle she points to.  She is been putting Vaseline on the area but she has noticed some mild swelling or redness to the area but denies any drainage or pus.  Also she has corns in both of her fifth toe that she was scratched under very painful with pressure in shoes.  Denies any acute changes since last appointment and no new complaints today. Denies any systemic complaints such as fevers, chills, nausea, vomiting.   PCP: Plotnikov, Evie Lacks, MD  She is on Xarelto   Objective: AAO 3, NAD DP/PT pulses palpable, CRT less than 3 seconds Nails hypertrophic, dystrophic, elongated, brittle, discolored 10. There is tenderness overlying the nails 1-5 bilaterally. There is no surrounding erythema or drainage along the nail sites. Hyperkeratotic lesions bilateral fifth toes.  Upon debridement there is no underlying ulceration, drainage or any clinical signs of infection noted today. Superficial wound present to the lateral aspect of the ankle and there is localized edema erythema tracking to the area but there is no ascending cellulitis.  There is no fluctuation or crepitation or any signs of infection noted otherwise. No open lesions or pre-ulcerative lesions are identified. No other areas of tenderness bilateral lower extremities. No overlying edema, erythema, increased warmth. No pain with calf compression, swelling, warmth, erythema. No acute changes  Assessment: Patient presents with symptomatic onychomycosis, hyperkeratotic lesions bilateral fifth toes, right ankle superficial wound with mild swelling  Plan: -Treatment options including alternatives, risks, complications were discussed -Nails sharply debrided 10 without  complication/bleeding. -Hyperkeratotic lesion sharply debrided x2 without any complications or bleeding.  -For the small wound on the lateral ankle.  Continue antibiotic ointment dressing changes daily.  Will start antibiotics.  Should call me had an issue minimize refilled Augmentin for her. -Follow-up as scheduled for wound check or sooner if any problems are to arise. In the meantime, encouraged to call the office with any questions, concerns, changes symptoms.  Celesta Gentile, DPM

## 2018-04-26 ENCOUNTER — Ambulatory Visit: Payer: Medicare Other

## 2018-04-27 ENCOUNTER — Encounter (HOSPITAL_COMMUNITY): Payer: Self-pay | Admitting: Emergency Medicine

## 2018-04-27 ENCOUNTER — Emergency Department (HOSPITAL_COMMUNITY)
Admission: EM | Admit: 2018-04-27 | Discharge: 2018-04-27 | Disposition: A | Discharge status: Home / Self Care | Payer: Medicare Other | Attending: Emergency Medicine | Admitting: Emergency Medicine

## 2018-04-27 ENCOUNTER — Ambulatory Visit: Payer: Medicare Other

## 2018-04-27 ENCOUNTER — Emergency Department (HOSPITAL_COMMUNITY): Payer: Medicare Other

## 2018-04-27 DIAGNOSIS — I2510 Atherosclerotic heart disease of native coronary artery without angina pectoris: Secondary | ICD-10-CM | POA: Diagnosis not present

## 2018-04-27 DIAGNOSIS — R1032 Left lower quadrant pain: Secondary | ICD-10-CM | POA: Insufficient documentation

## 2018-04-27 DIAGNOSIS — Z79899 Other long term (current) drug therapy: Secondary | ICD-10-CM | POA: Diagnosis not present

## 2018-04-27 DIAGNOSIS — I10 Essential (primary) hypertension: Secondary | ICD-10-CM | POA: Diagnosis not present

## 2018-04-27 LAB — URINALYSIS, ROUTINE W REFLEX MICROSCOPIC
Bilirubin Urine: NEGATIVE
GLUCOSE, UA: NEGATIVE mg/dL
KETONES UR: NEGATIVE mg/dL
LEUKOCYTES UA: NEGATIVE
NITRITE: NEGATIVE
PH: 7 (ref 5.0–8.0)
PROTEIN: NEGATIVE mg/dL
Specific Gravity, Urine: 1.011 (ref 1.005–1.030)

## 2018-04-27 LAB — COMPREHENSIVE METABOLIC PANEL
ALT: 29 U/L (ref 0–44)
ANION GAP: 9 (ref 5–15)
AST: 49 U/L — ABNORMAL HIGH (ref 15–41)
Albumin: 3.6 g/dL (ref 3.5–5.0)
Alkaline Phosphatase: 70 U/L (ref 38–126)
BUN: 11 mg/dL (ref 8–23)
CHLORIDE: 104 mmol/L (ref 98–111)
CO2: 28 mmol/L (ref 22–32)
Calcium: 8.9 mg/dL (ref 8.9–10.3)
Creatinine, Ser: 1.02 mg/dL — ABNORMAL HIGH (ref 0.44–1.00)
GFR, EST AFRICAN AMERICAN: 58 mL/min — AB (ref >60)
GFR, EST NON AFRICAN AMERICAN: 50 mL/min — AB (ref >60)
Glucose, Bld: 104 mg/dL — ABNORMAL HIGH (ref 70–99)
POTASSIUM: 3.9 mmol/L (ref 3.5–5.1)
Sodium: 141 mmol/L (ref 135–145)
TOTAL PROTEIN: 6.5 g/dL (ref 6.5–8.1)
Total Bilirubin: 0.9 mg/dL (ref 0.3–1.2)

## 2018-04-27 LAB — CBC
HEMATOCRIT: 39.2 % (ref 36.0–46.0)
HEMOGLOBIN: 12.1 g/dL (ref 12.0–15.0)
MCH: 28.3 pg (ref 26.0–34.0)
MCHC: 30.9 g/dL (ref 30.0–36.0)
MCV: 91.8 fL (ref 78.0–100.0)
Platelets: 215 10*3/uL (ref 150–400)
RBC: 4.27 MIL/uL (ref 3.87–5.11)
RDW: 13.1 % (ref 11.5–15.5)
WBC: 5.3 10*3/uL (ref 4.0–10.5)

## 2018-04-27 LAB — LIPASE, BLOOD: Lipase: 49 U/L (ref 11–51)

## 2018-04-27 MED ORDER — CYCLOBENZAPRINE HCL 10 MG PO TABS
5.0000 mg | ORAL_TABLET | Freq: Once | ORAL | Status: AC
  Administered 2018-04-27: 5 mg via ORAL
  Filled 2018-04-27: qty 1

## 2018-04-27 MED ORDER — CYCLOBENZAPRINE HCL 5 MG PO TABS: 5.0000 mg | ORAL_TABLET | Freq: Two times a day (BID) | ORAL | 0 refills | Status: DC | PRN

## 2018-04-27 NOTE — ED Notes (Signed)
Patient Alert and oriented to baseline. Stable and ambulatory to baseline. Patient verbalized understanding of the discharge instructions.  Patient belongings were taken by the patient.   

## 2018-04-27 NOTE — Discharge Instructions (Addendum)
You were seen today for abdominal pain.  Again your work-up is largely reassuring.  Given your symptoms, it could be related to a pulled muscle.  If you develop a rash, shingles is also a possibility.  Follow-up closely with your primary doctor.  You may take Flexeril but I recommend that you take this at night before bedtime.

## 2018-04-27 NOTE — ED Notes (Signed)
ED Provider at bedside. 
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
## 2018-04-27 NOTE — ED Triage Notes (Signed)
Pt reports intermittent LLQ abd pain X several weeks. Pt went to [facility] ED 3 days ago and has an unremarkable workup. Pt states she continues to have severe "cramping"

## 2018-05-03 ENCOUNTER — Ambulatory Visit: Payer: Medicare Other | Admitting: Podiatry

## 2018-05-03 ENCOUNTER — Encounter: Payer: Self-pay | Admitting: Podiatry

## 2018-05-03 DIAGNOSIS — L97311 Non-pressure chronic ulcer of right ankle limited to breakdown of skin: Secondary | ICD-10-CM

## 2018-05-03 NOTE — Patient Instructions (Signed)
Continue antibiotic ointment dressing changes 2 times a day. Wash with soap and water and dry thoroughly.  If not healed in the next 2 weeks, let me know Monitor for any signs/symptoms of infection. Call the office immediately if any occur or go directly to the emergency room. Call with any questions/concerns.

## 2018-05-04 NOTE — Progress Notes (Signed)
Subjective: 84 year old female presents the office today for follow-up evaluation of a small wound to the lateral aspect of the right ankle and the distal fibula.  She is been using the antibiotic ointment daily.  She could not tolerate the Augmentin that she did stop this.  Overall she feels that the wound is doing much better and she is very pleased that it is healing.  Denies any drainage or pus coming from the area no pain. Denies any systemic complaints such as fevers, chills, nausea, vomiting. No acute changes since last appointment, and no other complaints at this time.   Objective: AAO x3, NAD DP/PT pulses palpable bilaterally, CRT less than 3 seconds Along the lateral malleolus there is a small superficial wound present however the area is starting to scab over.  There is decreased edema and erythema to the area and there is no drainage or pus.  There is no pain.  No fluctuation or crepitation.  No malodor. No open lesions or pre-ulcerative lesions.  No pain with calf compression, swelling, warmth, erythema  Assessment: Healing wound right ankle  Plan: -All treatment options discussed with the patient including all alternatives, risks, complications.  -Overall the wound appears to be doing much better.  I want her to continue with antibiotic ointment dressing changes twice a day.  Monitoring signs or symptoms of infection.  The area is not healed the next 2 weeks to let me know.  Otherwise I will see her back for routine care. -Patient encouraged to call the office with any questions, concerns, change in symptoms.   Trula Slade DPM

## 2018-05-09 ENCOUNTER — Ambulatory Visit (INDEPENDENT_AMBULATORY_CARE_PROVIDER_SITE_OTHER): Payer: Medicare Other | Admitting: Emergency Medicine

## 2018-05-09 ENCOUNTER — Telehealth: Payer: Self-pay | Admitting: Emergency Medicine

## 2018-05-09 DIAGNOSIS — M810 Age-related osteoporosis without current pathological fracture: Secondary | ICD-10-CM

## 2018-05-09 DIAGNOSIS — E538 Deficiency of other specified B group vitamins: Secondary | ICD-10-CM | POA: Diagnosis not present

## 2018-05-09 MED ORDER — CYANOCOBALAMIN 1000 MCG/ML IJ SOLN
1000.0000 ug | Freq: Once | INTRAMUSCULAR | Status: AC
  Administered 2018-05-09: 1000 ug via INTRAMUSCULAR

## 2018-05-09 MED ORDER — TRAMADOL HCL 50 MG PO TABS: 25.0000 mg | ORAL_TABLET | Freq: Four times a day (QID) | ORAL | 0 refills | Status: DC | PRN

## 2018-05-09 MED ORDER — DENOSUMAB 60 MG/ML ~~LOC~~ SOSY
60.0000 mg | PREFILLED_SYRINGE | Freq: Once | SUBCUTANEOUS | Status: AC
  Administered 2018-05-09: 60 mg via SUBCUTANEOUS

## 2018-05-09 NOTE — Telephone Encounter (Signed)
Ok thx.

## 2018-05-09 NOTE — Telephone Encounter (Signed)
Pt came in for nurse visit today and asked if she could have a weeks RX for Tramadol to take with her traveling next week to [place]. Please send to [facility] on file. Have Inez Catalina contact pt if not okay.  Thanks

## 2018-05-10 NOTE — Telephone Encounter (Signed)
Called pt no answer LMOM MD sent rx to [facility]../lmb

## 2018-05-12 ENCOUNTER — Ambulatory Visit (INDEPENDENT_AMBULATORY_CARE_PROVIDER_SITE_OTHER): Payer: Medicare Other

## 2018-05-12 DIAGNOSIS — Z23 Encounter for immunization: Secondary | ICD-10-CM | POA: Diagnosis not present

## 2018-05-13 ENCOUNTER — Ambulatory Visit: Payer: Medicare Other

## 2018-05-17 ENCOUNTER — Ambulatory Visit: Payer: Medicare Other | Admitting: Podiatry

## 2018-05-17 DIAGNOSIS — L97311 Non-pressure chronic ulcer of right ankle limited to breakdown of skin: Secondary | ICD-10-CM

## 2018-05-17 NOTE — Progress Notes (Signed)
Subjective: 84 year old female presents the office today for follow-up evaluation of a small wound to the lateral aspect of the right ankle and the distal fibula.  She states that it is doing well and almost healed but is going out of town and wants to have it checked. Denies any drainage, redness, pus, red streaks, swelling. Denies any systemic complaints such as fevers, chills, nausea, vomiting. No acute changes since last appointment, and no other complaints at this time.   Objective: AAO x3, NAD DP/PT pulses palpable bilaterally, CRT less than 3 seconds Along the lateral malleolus there is a a very small superficial wound present and it is almost scabbed over. There is no surrounding edema, erythema and there is no drainage or pus.  There is no pain.  No fluctuation or crepitation.  No malodor. No open lesions or pre-ulcerative lesions.  No pain with calf compression, swelling, warmth, erythema  Assessment: Healing wound right ankle  Plan: -All treatment options discussed with the patient including all alternatives, risks, complications.  -Overall the wound appears to be doing much better. Continue with antibiotic ointment daily but a dry bandage at night to allow it to dry. Monitor for any clinical signs or symptoms of infection and directed to call the office immediately should any occur or go to the ER. -RTC as scheduled for routine care but if not healed in the next 2 weeks then to let me know   Trula Slade DPM

## 2018-05-18 ENCOUNTER — Ambulatory Visit: Payer: Medicare Other | Admitting: Internal Medicine

## 2018-05-28 ENCOUNTER — Other Ambulatory Visit: Payer: Self-pay | Admitting: Internal Medicine

## 2018-05-30 ENCOUNTER — Encounter: Payer: Self-pay | Admitting: Family

## 2018-05-30 ENCOUNTER — Ambulatory Visit: Payer: Medicare Other | Admitting: Internal Medicine

## 2018-05-30 ENCOUNTER — Ambulatory Visit: Payer: Medicare Other | Admitting: Family

## 2018-05-30 ENCOUNTER — Ambulatory Visit: Payer: Self-pay | Admitting: *Deleted

## 2018-05-30 VITALS — BP 126/68 | HR 55 | Temp 97.5°F | Ht 62.0 in | Wt 121.0 lb

## 2018-05-30 DIAGNOSIS — H6992 Unspecified Eustachian tube disorder, left ear: Secondary | ICD-10-CM

## 2018-05-30 DIAGNOSIS — H6982 Other specified disorders of Eustachian tube, left ear: Secondary | ICD-10-CM

## 2018-05-30 DIAGNOSIS — T50905A Adverse effect of unspecified drugs, medicaments and biological substances, initial encounter: Secondary | ICD-10-CM | POA: Diagnosis not present

## 2018-05-30 DIAGNOSIS — L658 Other specified nonscarring hair loss: Secondary | ICD-10-CM

## 2018-05-30 DIAGNOSIS — R600 Localized edema: Secondary | ICD-10-CM

## 2018-05-30 MED ORDER — FLUTICASONE PROPIONATE 50 MCG/ACT NA SUSP: 2.0000 | Freq: Every day | NASAL | 6 refills | Status: DC

## 2018-05-30 MED ORDER — TELMISARTAN 40 MG PO TABS: 40.0000 mg | ORAL_TABLET | Freq: Every day | ORAL | 0 refills | Status: DC
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
## 2018-05-30 NOTE — Telephone Encounter (Addendum)
Contacted pt regarding symptoms; she says that it is not new; R>L; she says the swelling is half way down her calf to her foot; she says that she has swelling all the time but this is different; the pt reports that her legs are red due to amiodarone but they really get red when they swell; this has been going on for past couple of weeks; also the pt has an appointment; pt has previously scheduled appointment with Jodi Mourning, [facility] 05/30/18 regarding ear issue; spoke with [place] and she states that if time permits, Jodi Mourning may be able to address this ongoing leg swelling; if not, the pt will have to schedule another appointment for this issue; the pt verbalizes understanding; will route to office for notification of this encounter.  Reason for Disposition . [1] MODERATE leg swelling (e.g., swelling extends up to knees) AND [2] new onset or worsening  Answer Assessment - Initial Assessment Questions 1. ONSET: "When did the swelling start?" (e.g., minutes, hours, days)     A couple of weeks ago 2. LOCATION: "What part of the leg is swollen?"  "Are both legs swollen or just one leg?"     Halfway down calf to foot 3. SEVERITY: "How bad is the swelling?" (e.g., localized; mild, moderate, severe)  - Localized - small area of swelling localized to one leg  - MILD pedal edema - swelling limited to foot and ankle, pitting edema < 1/4 inch (6 mm) deep, rest and elevation eliminate most or all swelling  - MODERATE edema - swelling of lower leg to knee, pitting edema > 1/4 inch (6 mm) deep, rest and elevation only partially reduce swelling  - SEVERE edema - swelling extends above knee, facial or hand swelling present      Moderate 4. REDNESS: "Does the swelling look red or infected?"     redness 5. PAIN: "Is the swelling painful to touch?" If so, ask: "How painful is it?"   (Scale 1-10; mild, moderate or severe)     Pain when they swell 6. FEVER: "Do you have a fever?" If so, ask: "What is it, how  was it measured, and when did it start?"      no 7. CAUSE: "What do you think is causing the leg swelling?"     ongoing 8. MEDICAL HISTORY: "Do you have a history of heart failure, kidney disease, liver failure, or cancer?"     afib 9. RECURRENT SYMPTOM: "Have you had leg swelling before?" If so, ask: "When was the last time?" "What happened that time?"     Ongoing 10. OTHER SYMPTOMS: "Do you have any other symptoms?" (e.g., chest pain, difficulty breathing)       no 11. PREGNANCY: "Is there any chance you are pregnant?" "When was your last menstrual period?"       no  Protocols used: LEG SWELLING AND EDEMA-A-AH

## 2018-05-30 NOTE — Patient Instructions (Signed)
Eustachian Tube Dysfunction The eustachian tube connects the middle ear to the back of the nose. It regulates air pressure in the middle ear by allowing air to move between the ear and nose. It also helps to drain fluid from the middle ear space. When the eustachian tube does not function properly, air pressure, fluid, or both can build up in the middle ear. Eustachian tube dysfunction can affect one or both ears. What are the causes? This condition happens when the eustachian tube becomes blocked or cannot open normally. This may result from:  Ear infections.  Colds and other upper respiratory infections.  Allergies.  Irritation, such as from cigarette smoke or acid from the stomach coming up into the esophagus (gastroesophageal reflux).  Sudden changes in air pressure, such as from descending in an airplane.  Abnormal growths in the nose or throat, such as nasal polyps, tumors, or enlarged tissue at the back of the throat (adenoids).  What increases the risk? This condition may be more likely to develop in people who smoke and people who are overweight. Eustachian tube dysfunction may also be more likely to develop in children, especially children who have:  Certain birth defects of the mouth, such as cleft palate.  Large tonsils and adenoids.  What are the signs or symptoms? Symptoms of this condition may include:  A feeling of fullness in the ear.  Ear pain.  Clicking or popping noises in the ear.  Ringing in the ear.  Hearing loss.  Loss of balance.  Symptoms may get worse when the air pressure around you changes, such as when you travel to an area of high elevation or fly on an airplane. How is this diagnosed? This condition may be diagnosed based on:  Your symptoms.  A physical exam of your ear, nose, and throat.  Tests, such as those that measure: ? The movement of your eardrum (tympanogram). ? Your hearing (audiometry).  How is this treated? Treatment  depends on the cause and severity of your condition. If your symptoms are mild, you may be able to relieve your symptoms by moving air into ("popping") your ears. If you have symptoms of fluid in your ears, treatment may include:  Decongestants.  Antihistamines.  Nasal sprays or ear drops that contain medicines that reduce swelling (steroids).  In some cases, you may need to have a procedure to drain the fluid in your eardrum (myringotomy). In this procedure, a small tube is placed in the eardrum to:  Drain the fluid.  Restore the air in the middle ear space.  Follow these instructions at home:  Take over-the-counter and prescription medicines only as told by your health care provider.  Use techniques to help pop your ears as recommended by your health care provider. These may include: ? Chewing gum. ? Yawning. ? Frequent, forceful swallowing. ? Closing your mouth, holding your nose closed, and gently blowing as if you are trying to blow air out of your nose.  Do not do any of the following until your health care provider approves: ? Travel to high altitudes. ? Fly in airplanes. ? Work in a pressurized cabin or room. ? Scuba dive.  Keep your ears dry. Dry your ears completely after showering or bathing.  Do not smoke.  Keep all follow-up visits as told by your health care provider. This is important. Contact a health care provider if:  Your symptoms do not go away after treatment.  Your symptoms come back after treatment.  You are   unable to pop your ears.  You have: ? A fever. ? Pain in your ear. ? Pain in your head or neck. ? Fluid draining from your ear.  Your hearing suddenly changes.  You become very dizzy.  You lose your balance. This information is not intended to replace advice given to you by your health care provider. Make sure you discuss any questions you have with your health care provider. Document Released: 08/30/2015 Document Revised: 01/09/2016  Document Reviewed: 08/22/2014 Elsevier Interactive Patient Education  2018 Elsevier Inc.  

## 2018-06-03 ENCOUNTER — Other Ambulatory Visit: Payer: Self-pay | Admitting: Internal Medicine

## 2018-06-03 ENCOUNTER — Ambulatory Visit: Payer: Self-pay

## 2018-06-03 MED ORDER — AMLODIPINE BESYLATE 5 MG PO TABS: 5.0000 mg | ORAL_TABLET | Freq: Every day | ORAL | 0 refills | Status: DC

## 2018-06-03 NOTE — Addendum Note (Signed)
Addended by: Karren Cobble on: 06/03/2018 04:24 PM   Modules accepted: Orders

## 2018-06-03 NOTE — Telephone Encounter (Signed)
Pt. Called to report the Micardis "was not working - still dizzy." Request to go back on Amlodipine. Wants enough called in to [place], until she can see her cardiologist at [facility]. Pt. Not available until after 2:00 p.m. Please advise pt.

## 2018-06-03 NOTE — Telephone Encounter (Signed)
Spoke with pt ans she stated she stoped the Micardis because of how awful it was making her feel and restarted the amlodipine and needs a short supply until she hears back from cardiology.  30 day RX sent

## 2018-06-07 ENCOUNTER — Ambulatory Visit: Payer: Medicare Other | Admitting: Internal Medicine

## 2018-06-13 ENCOUNTER — Encounter: Payer: Self-pay | Admitting: Internal Medicine

## 2018-06-13 ENCOUNTER — Other Ambulatory Visit: Payer: Self-pay | Admitting: Internal Medicine

## 2018-06-13 ENCOUNTER — Ambulatory Visit: Payer: Medicare Other | Admitting: Internal Medicine

## 2018-06-13 VITALS — BP 114/66 | HR 54 | Temp 98.0°F | Ht 62.0 in | Wt 120.0 lb

## 2018-06-13 DIAGNOSIS — H9202 Otalgia, left ear: Secondary | ICD-10-CM | POA: Insufficient documentation

## 2018-06-13 DIAGNOSIS — G250 Essential tremor: Secondary | ICD-10-CM

## 2018-06-13 DIAGNOSIS — D329 Benign neoplasm of meninges, unspecified: Secondary | ICD-10-CM

## 2018-06-13 MED ORDER — GABAPENTIN 300 MG/6ML PO SOLN: 6.0000 mL | Freq: Two times a day (BID) | ORAL | 5 refills | Status: DC | PRN

## 2018-06-13 MED ORDER — NEOMYCIN-POLYMYXIN-HC 3.5-10000-1 OT SOLN: 3.0000 [drp] | Freq: Three times a day (TID) | OTIC | 3 refills | Status: DC

## 2018-06-13 NOTE — Assessment & Plan Note (Signed)
ENT ref Cortisporin otic L ear with two 1 mm blood blisters (dry) on the posterior wall

## 2018-06-13 NOTE — Progress Notes (Signed)
Subjective:  Patient ID: Alyssa Luna, female    DOB: March 11, 1934  Age: 84 y.o. MRN: [id number]  CC: No chief complaint on file.   HPI JUHI LAGRANGE presents for L ear popping which started after a flight from [place] x 2 weeks C/o tremors  Outpatient Medications Prior to Visit  Medication Sig Dispense Refill  . amLODipine (NORVASC) 5 MG tablet Take 1 tablet (5 mg total) by mouth daily. 30 tablet 0  . cetirizine (ZYRTEC) 10 MG tablet Take 10 mg by mouth daily as needed for allergies.    . Cholecalciferol (D3 DOTS) 2000 units TBDP Take by mouth daily.    . Cyanocobalamin (VITAMIN B-12 IJ) Inject as directed every 30 (thirty) days.    . cyclobenzaprine (FLEXERIL) 5 MG tablet Take 1 tablet (5 mg total) by mouth 2 (two) times daily as needed for muscle spasms. 10 tablet 0  . denosumab (PROLIA) 60 MG/ML SOLN injection Inject 60 mg into the skin every 6 (six) months. Administer in upper arm, thigh, or abdomen Unable to tolerate oral meds Dx severe osteoporosis 1.8 mL 6  . diphenhydrAMINE HCl (BENADRYL ALLERGY CHILDRENS PO) Take by mouth at bedtime as needed. Takes at night when itching    . famotidine (PEPCID) 20 MG tablet TAKE 1 TABLET BY MOUTH TWICE A DAY 180 tablet 0  . fluticasone (FLONASE) 50 MCG/ACT nasal spray Place 2 sprays into both nostrils daily. 16 g 6  . gabapentin (NEURONTIN) 250 MG/5ML solution Take 100-300 mg by mouth See admin instructions. 325m twice daily as needed for pain. May take an additional 10110mat bedtime    . Gabapentin 300 MG/6ML SOLN Take 6 mLs by mouth 2 (two) times daily as needed. Take additional 2 ml at bedtime 470 mL 5  . hydrocortisone 2.5 % cream Apply 1 application topically 3 (three) times daily as needed (itching).    . montelukast (SINGULAIR) 10 MG tablet Take 1 tablet (10 mg total) by mouth daily. 30 tablet 11  . mupirocin ointment (BACTROBAN) 2 % Apply 1 application topically 2 (two) times daily. 30 g 2  . Rivaroxaban (XARELTO) 15 MG TABS  tablet Take 15 mg by mouth daily.     . Simethicone (GAS-X PO) Take 1 tablet by mouth daily as needed (flatulence).     . Marland Kitchenelmisartan (MICARDIS) 40 MG tablet Take 1 tablet (40 mg total) by mouth daily. 30 tablet 0  . traMADol (ULTRAM) 50 MG tablet Take 0.5-1 tablets (25-50 mg total) by mouth every 6 (six) hours as needed for severe pain. 20 tablet 0  . Wheat Dextrin (BENEFIBER PO) Take 1 tablet by mouth daily.    . Marland Kitchenmiodarone (PACERONE) 200 MG tablet Take 200 mg by mouth daily.     . Marland Kitchenicyclomine (BENTYL) 20 MG tablet TAKE 1 TABLET BY MOUTH EVERY 6 HOURS AS NEEDED FOR ABDOMINAL CRAMPING  0   No facility-administered medications prior to visit.     ROS: Review of Systems  Constitutional: Negative for activity change, appetite change, chills, fatigue and unexpected weight change.  HENT: Positive for ear discharge, ear pain, postnasal drip and rhinorrhea. Negative for congestion, mouth sores and sinus pressure.   Eyes: Negative for visual disturbance.  Respiratory: Negative for cough and chest tightness.   Gastrointestinal: Negative for abdominal pain and nausea.  Genitourinary: Negative for difficulty urinating, frequency and vaginal pain.  Musculoskeletal: Negative for back pain and gait problem.  Skin: Negative for pallor and rash.  Neurological: Negative for dizziness,  tremors, weakness, numbness and headaches.  Psychiatric/Behavioral: Negative for confusion and sleep disturbance.    Objective:  BP 114/66 (BP Location: Left Arm, Patient Position: Sitting, Cuff Size: Normal)   Pulse (!) 54   Temp 98 F (36.7 C) (Oral)   Ht 5' 2"  (1.575 m)   Wt 120 lb (54.4 kg)   SpO2 96%   BMI 21.95 kg/m   BP Readings from Last 3 Encounters:  06/13/18 114/66  05/30/18 126/68  04/27/18 (!) 141/70    Wt Readings from Last 3 Encounters:  06/13/18 120 lb (54.4 kg)  05/30/18 121 lb 0.6 oz (54.9 kg)  04/27/18 115 lb (52.2 kg)    Physical Exam  Constitutional: She appears well-developed. No  distress.  HENT:  Head: Normocephalic.  Right Ear: External ear normal.  Left Ear: External ear normal.  Nose: Nose normal.  Mouth/Throat: Oropharynx is clear and moist.  Eyes: Pupils are equal, round, and reactive to light. Conjunctivae are normal. Right eye exhibits no discharge. Left eye exhibits no discharge.  Neck: Normal range of motion. Neck supple. No JVD present. No tracheal deviation present. No thyromegaly present.  Cardiovascular: Normal rate, regular rhythm and normal heart sounds.  Pulmonary/Chest: No stridor. No respiratory distress. She has no wheezes.  Abdominal: Soft. Bowel sounds are normal. She exhibits no distension and no mass. There is no tenderness. There is no rebound and no guarding.  Musculoskeletal: She exhibits no edema or tenderness.  Lymphadenopathy:    She has no cervical adenopathy.  Neurological: She displays normal reflexes. No cranial nerve deficit. She exhibits normal muscle tone. Coordination abnormal.  Skin: No rash noted. No erythema.  Psychiatric: She has a normal mood and affect. Her behavior is normal. Judgment and thought content normal.  tremor L ear with two 1 mm blood blisters (dry) on the posterior wall   Lab Results  Component Value Date   WBC 5.3 04/27/2018   HGB 12.1 04/27/2018   HCT 39.2 04/27/2018   PLT 215 04/27/2018   GLUCOSE 104 (H) 04/27/2018   CHOL 160 02/25/2018   TRIG 119.0 02/25/2018   HDL 42.90 02/25/2018   LDLDIRECT 178.0 09/24/2011   LDLCALC 94 02/25/2018   ALT 29 04/27/2018   AST 49 (H) 04/27/2018   NA 141 04/27/2018   K 3.9 04/27/2018   CL 104 04/27/2018   CREATININE 1.02 (H) 04/27/2018   BUN 11 04/27/2018   CO2 28 04/27/2018   TSH 3.13 06/22/2017   INR 1.7 10/18/2015    Dg Abdomen Acute W/chest  Result Date: 04/27/2018 CLINICAL DATA:  Intermittent left-sided abdominal pain for 1 week. Nausea. EXAM: DG ABDOMEN ACUTE W/ 1V CHEST COMPARISON:  CT 3 days prior 04/24/2018 at [facility]. FINDINGS: The  cardiomediastinal contours are normal. Moderate hiatal hernia. The lungs are clear. Surgical clips in the left axilla. There is no free intra-abdominal air. No dilated bowel loops to suggest obstruction. Small to moderate volume of stool throughout the colon. Pelvic phleboliths. No radiopaque calculi. Scoliotic curvature in the thoracolumbar spine. Vertebral augmentation at the thoracolumbar junction. No acute osseous abnormalities are seen. IMPRESSION: 1. Normal bowel gas pattern. Small to moderate colonic stool burden. Note: Patient had abdominal CT 3 [facility] prior [facility] demonstrating no acute abnormality. 2. Moderate hiatal hernia. 3.  No acute pulmonary process. Electronically Signed   By: Keith Rake M.D.   On: 04/27/2018 05:40    Assessment & Plan:   There are no diagnoses linked to this encounter.  No orders of the defined types were placed in this encounter.    Follow-up: No follow-ups on file.  Walker Kehr, MD

## 2018-06-13 NOTE — Assessment & Plan Note (Signed)
Pt had an MRI recently

## 2018-06-13 NOTE — Assessment & Plan Note (Signed)
Discussed. The pt is going to get a consult at [facility] re: gamma knife option

## 2018-06-14 ENCOUNTER — Ambulatory Visit: Payer: Medicare Other

## 2018-06-15 ENCOUNTER — Encounter: Payer: Self-pay | Admitting: Internal Medicine

## 2018-06-15 ENCOUNTER — Ambulatory Visit: Payer: Medicare Other | Admitting: Internal Medicine

## 2018-06-15 VITALS — BP 110/62 | HR 74 | Ht 62.0 in | Wt 121.5 lb

## 2018-06-15 DIAGNOSIS — K5730 Diverticulosis of large intestine without perforation or abscess without bleeding: Secondary | ICD-10-CM

## 2018-06-15 DIAGNOSIS — K219 Gastro-esophageal reflux disease without esophagitis: Secondary | ICD-10-CM | POA: Diagnosis not present

## 2018-06-15 DIAGNOSIS — Z91018 Allergy to other foods: Secondary | ICD-10-CM | POA: Diagnosis not present

## 2018-06-15 DIAGNOSIS — K589 Irritable bowel syndrome without diarrhea: Secondary | ICD-10-CM

## 2018-06-15 DIAGNOSIS — K449 Diaphragmatic hernia without obstruction or gangrene: Secondary | ICD-10-CM

## 2018-06-15 MED ORDER — FAMOTIDINE 20 MG PO TABS: 20.0000 mg | ORAL_TABLET | Freq: Two times a day (BID) | ORAL | 1 refills | Status: DC

## 2018-06-15 NOTE — Patient Instructions (Signed)
Please follow up with Dr Hilarie Fredrickson in 6 months.  We have sent the following medications to your pharmacy for you to pick up at your convenience: Pepcid 20 mg twice daily  Please purchase the following medications over the counter and take as directed: Benefiber daily  If you are age 82 or older, your body mass index should be between 23-30. Your Body mass index is 22.22 kg/m. If this is out of the aforementioned range listed, please consider follow up with your Primary Care Provider.  If you are age 29 or younger, your body mass index should be between 19-25. Your Body mass index is 22.22 kg/m. If this is out of the aformentioned range listed, please consider follow up with your Primary Care Provider.

## 2018-06-15 NOTE — Progress Notes (Signed)
Subjective:    Patient ID: Alyssa Luna, female    DOB: 03/15/1934, 84 y.o.   MRN: [id number]  HPI Alyssa Luna is an 84 year old female with a history of GERD, hiatal hernia, esophageal dysmotility, severe colonic diverticulosis, alpha gal disorder, atrial fibrillation on Xarelto, scoliosis, hypertension who is here for follow-up.  She was last seen on 03/25/2018 by Nicoletta Ba, PA-C  That point she was getting over an episode of what sounds like ischemic, segmental, colitis.  She had developed fairly sudden onset crampy abdominal pain with loose stool and one episode of rectal bleeding.  This has resolved completely.  Today she is feeling well.  She returned about a month ago from a trip to [place] which she thoroughly enjoyed.  Reflux seems mostly controlled on famotidine 20 mg twice daily.  She still drinks some citric acid in the form of orange juice and enjoys some fruits containing citric acid.  She states that these cause some burning discomfort in her esophagus and stomach but not enough to stop the food she enjoys.  No dysphagia or odynophagia.  She is strictly avoiding animal products including gelatin and with this her symptoms have improved tremendously particular abdominal pain, bloating and loose stool.  She is having regular bowel movements on Benefiber 2 to 3 teaspoons daily.  Was having issues with nausea but this has resolved after stopping amlodipine.  She continues on telmisartan, and amiodarone.  He had a noncontrasted CT scan of the abdomen pelvis formed in the [facility].  I reviewed this result.  It showed colonic diverticulosis without diverticulitis.  Review of Systems As per HPI, otherwise negative    Objective:   Physical Exam BP 110/62   Pulse 74   Ht 5' 2"  (1.575 m)   Wt 121 lb 8 oz (55.1 kg)   BMI 22.22 kg/m  Constitutional: Well-developed and well-nourished. No distress. HEENT: Normocephalic and atraumatic.  Conjunctivae are normal.  No  scleral icterus. Neck: Neck supple. Trachea midline. Cardiovascular: Regular, 2/6 systolic ejection murmur Pulmonary/chest: Effort normal and breath sounds normal. No wheezing, rales or rhonchi. Abdominal: Soft, nontender, nondistended. Bowel sounds active throughout.  Extremities: no clubbing, cyanosis, right greater than left lower extremity edema, 1+ on the right Neurological: Alert and oriented to person place and time. Skin: Skin is warm and dry. Psychiatric: Normal mood and affect. Behavior is normal.  CT ABDOMEN AND PELVIS WITHOUT CONTRAST  TECHNIQUE: Multidetector CT imaging of the abdomen and pelvis was performed following the standard protocol without IV contrast.  COMPARISON:  03/04/2018  FINDINGS: Lower chest: No acute findings.  Hepatobiliary: No masses visualized on this unenhanced exam. A few tiny sub-cm low-attenuation lesions remains stable most likely represent tiny cysts. Diffusely increased attenuation of hepatic parenchyma is again demonstrated, which is likely due to amiodarone use or possibly heavy metal deposition. A few tiny calcified gallstones are noted, however there is no evidence of cholecystitis or biliary obstruction.  Pancreas: No mass or inflammatory process visualized on this unenhanced exam.  Spleen:  Within normal limits in size.  Adrenals/Urinary tract: Stable small fluid attenuation cyst in upper pole of the left kidney. No evidence of nephrolithiasis or hydronephrosis.  Stomach/Bowel: No acute findings. Moderate size hiatal hernia again seen. Previously seen left colonic wall thickening is no longer visualized. Mild diverticulosis of the sigmoid colon, without evidence of diverticulitis.  Vascular/Lymphatic: No pathologically enlarged lymph nodes identified. No evidence of abdominal aortic aneurysm. Aortic atherosclerosis.  Other: Prior hysterectomy noted. Adnexal  regions are unremarkable in appearance.  Musculoskeletal: No  suspicious bone lesions identified. Old L1 vertebral compression fracture and vertebroplasty again noted.  IMPRESSION: No acute findings.  Stable moderate size hiatal hernia.  Colonic diverticulosis, without radiographic evidence of diverticulitis.  Cholelithiasis.  No radiographic evidence of cholecystitis.   Electronically Signed   By: Earle Gell M.D.   On: 04/24/2018 10:12  CBC    Component Value Date/Time   WBC 5.3 04/27/2018 0341   RBC 4.27 04/27/2018 0341   HGB 12.1 04/27/2018 0341   HCT 39.2 04/27/2018 0341   PLT 215 04/27/2018 0341   MCV 91.8 04/27/2018 0341   MCH 28.3 04/27/2018 0341   MCHC 30.9 04/27/2018 0341   RDW 13.1 04/27/2018 0341   LYMPHSABS 1.8 06/22/2017 1423   MONOABS 0.5 06/22/2017 1423   EOSABS 0.1 06/22/2017 1423   BASOSABS 0.1 06/22/2017 1423   CMP     Component Value Date/Time   NA 141 04/27/2018 0341   K 3.9 04/27/2018 0341   CL 104 04/27/2018 0341   CO2 28 04/27/2018 0341   GLUCOSE 104 (H) 04/27/2018 0341   BUN 11 04/27/2018 0341   CREATININE 1.02 (H) 04/27/2018 0341   CALCIUM 8.9 04/27/2018 0341   PROT 6.5 04/27/2018 0341   ALBUMIN 3.6 04/27/2018 0341   AST 49 (H) 04/27/2018 0341   ALT 29 04/27/2018 0341   ALKPHOS 70 04/27/2018 0341   BILITOT 0.9 04/27/2018 0341   GFRNONAA 50 (L) 04/27/2018 0341   GFRAA 58 (L) 04/27/2018 0341        Assessment & Plan:  84 year old female with a history of GERD, hiatal hernia, esophageal dysmotility, severe colonic diverticulosis, alpha gal disorder, atrial fibrillation on Xarelto, scoliosis, hypertension who is here for follow-up.   1.  GERD with hiatal hernia --we discussed symptoms today which I feel are well controlled.  She will continue famotidine 20 mg twice daily.  H2 blocker felt preferred for her over PPI given her osteoporosis/bone disease  2.  Alpha gal --continue strict beef-free diet.  She avoids consuming any animals with "4 legs".  3.  Diverticulosis/history of ischemic  colitis --symptoms have improved and she is doing well from a bowel movement standpoint on Benefiber.  Continue Benefiber 2 to 3 teaspoons daily.  She is not needing MiraLAX at this time.  Diarrhea has resolved.  Follow-up in about 6 months, sooner if necessary 25 minutes spent with the patient today. Greater than 50% was spent in counseling and coordination of care with the patient

## 2018-06-16 ENCOUNTER — Ambulatory Visit (INDEPENDENT_AMBULATORY_CARE_PROVIDER_SITE_OTHER): Payer: Medicare Other | Admitting: *Deleted

## 2018-06-16 VITALS — BP 128/63 | HR 64 | Resp 17 | Ht 62.0 in | Wt 121.0 lb

## 2018-06-16 DIAGNOSIS — Z0000 Encounter for general adult medical examination without abnormal findings: Secondary | ICD-10-CM

## 2018-06-16 DIAGNOSIS — E538 Deficiency of other specified B group vitamins: Secondary | ICD-10-CM

## 2018-06-16 MED ORDER — CYANOCOBALAMIN 1000 MCG/ML IJ SOLN
1000.0000 ug | Freq: Once | INTRAMUSCULAR | Status: AC
  Administered 2018-06-16: 1000 ug via INTRAMUSCULAR

## 2018-06-16 NOTE — Patient Instructions (Addendum)
Continue doing brain stimulating activities (puzzles, reading, adult coloring books, staying active) to keep memory sharp.   Continue to eat heart healthy diet (full of fruits, vegetables, whole grains, lean protein, water--limit salt, fat, and sugar intake) and increase physical activity as tolerated.   Alyssa Luna , Thank you for taking time to come for your Medicare Wellness Visit. I appreciate your ongoing commitment to your health goals. Please review the following plan we discussed and let me know if I can assist you in the future.   These are the goals we discussed: Goals    . Patient Stated     Stay as active, healthy, and as independent as possible.    . Stay as active and as independent as possible     Continue to plant my flowers, enjoy life, and travel with my husband and friends.       This is a list of the screening recommended for you and due dates:  Health Maintenance  Topic Date Due  . Tetanus Vaccine  01/16/2017  . Flu Shot  Completed  . DEXA scan (bone density measurement)  Completed  . Pneumonia vaccines  Completed    Health Maintenance, Female Adopting a healthy lifestyle and getting preventive care can go a long way to promote health and wellness. Talk with your health care provider about what schedule of regular examinations is right for you. This is a good chance for you to check in with your provider about disease prevention and staying healthy. In between checkups, there are plenty of things you can do on your own. Experts have done a lot of research about which lifestyle changes and preventive measures are most likely to keep you healthy. Ask your health care provider for more information. Weight and diet Eat a healthy diet  Be sure to include plenty of vegetables, fruits, low-fat dairy products, and lean protein.  Do not eat a lot of foods high in solid fats, added sugars, or salt.  Get regular exercise. This is one of the most important things you  can do for your health. ? Most adults should exercise for at least 150 minutes each week. The exercise should increase your heart rate and make you sweat (moderate-intensity exercise). ? Most adults should also do strengthening exercises at least twice a week. This is in addition to the moderate-intensity exercise.  Maintain a healthy weight  Body mass index (BMI) is a measurement that can be used to identify possible weight problems. It estimates body fat based on height and weight. Your health care provider can help determine your BMI and help you achieve or maintain a healthy weight.  For females 49 years of age and older: ? A BMI below 18.5 is considered underweight. ? A BMI of 18.5 to 24.9 is normal. ? A BMI of 25 to 29.9 is considered overweight. ? A BMI of 30 and above is considered obese.  Watch levels of cholesterol and blood lipids  You should start having your blood tested for lipids and cholesterol at 82 years of age, then have this test every 5 years.  You may need to have your cholesterol levels checked more often if: ? Your lipid or cholesterol levels are high. ? You are older than 82 years of age. ? You are at high risk for heart disease.  Cancer screening Lung Cancer  Lung cancer screening is recommended for adults 77-68 years old who are at high risk for lung cancer because of a history of  smoking.  A yearly low-dose CT scan of the lungs is recommended for people who: ? Currently smoke. ? Have quit within the past 15 years. ? Have at least a 30-pack-year history of smoking. A pack year is smoking an average of one pack of cigarettes a day for 1 year.  Yearly screening should continue until it has been 15 years since you quit.  Yearly screening should stop if you develop a health problem that would prevent you from having lung cancer treatment.  Breast Cancer  Practice breast self-awareness. This means understanding how your breasts normally appear and  feel.  It also means doing regular breast self-exams. Let your health care provider know about any changes, no matter how small.  If you are in your 20s or 30s, you should have a clinical breast exam (CBE) by a health care provider every 1-3 years as part of a regular health exam.  If you are 88 or older, have a CBE every year. Also consider having a breast X-ray (mammogram) every year.  If you have a family history of breast cancer, talk to your health care provider about genetic screening.  If you are at high risk for breast cancer, talk to your health care provider about having an MRI and a mammogram every year.  Breast cancer gene (BRCA) assessment is recommended for women who have family members with BRCA-related cancers. BRCA-related cancers include: ? Breast. ? Ovarian. ? Tubal. ? Peritoneal cancers.  Results of the assessment will determine the need for genetic counseling and BRCA1 and BRCA2 testing.  Cervical Cancer Your health care provider may recommend that you be screened regularly for cancer of the pelvic organs (ovaries, uterus, and vagina). This screening involves a pelvic examination, including checking for microscopic changes to the surface of your cervix (Pap test). You may be encouraged to have this screening done every 3 years, beginning at age 16.  For women ages 77-65, health care providers may recommend pelvic exams and Pap testing every 3 years, or they may recommend the Pap and pelvic exam, combined with testing for human papilloma virus (HPV), every 5 years. Some types of HPV increase your risk of cervical cancer. Testing for HPV may also be done on women of any age with unclear Pap test results.  Other health care providers may not recommend any screening for nonpregnant women who are considered low risk for pelvic cancer and who do not have symptoms. Ask your health care provider if a screening pelvic exam is right for you.  If you have had past treatment for  cervical cancer or a condition that could lead to cancer, you need Pap tests and screening for cancer for at least 20 years after your treatment. If Pap tests have been discontinued, your risk factors (such as having a new sexual partner) need to be reassessed to determine if screening should resume. Some women have medical problems that increase the chance of getting cervical cancer. In these cases, your health care provider may recommend more frequent screening and Pap tests.  Colorectal Cancer  This type of cancer can be detected and often prevented.  Routine colorectal cancer screening usually begins at 82 years of age and continues through 82 years of age.  Your health care provider may recommend screening at an earlier age if you have risk factors for colon cancer.  Your health care provider may also recommend using home test kits to check for hidden blood in the stool.  A small camera at  the end of a tube can be used to examine your colon directly (sigmoidoscopy or colonoscopy). This is done to check for the earliest forms of colorectal cancer.  Routine screening usually begins at age 27.  Direct examination of the colon should be repeated every 5-10 years through 82 years of age. However, you may need to be screened more often if early forms of precancerous polyps or small growths are found.  Skin Cancer  Check your skin from head to toe regularly.  Tell your health care provider about any new moles or changes in moles, especially if there is a change in a mole's shape or color.  Also tell your health care provider if you have a mole that is larger than the size of a pencil eraser.  Always use sunscreen. Apply sunscreen liberally and repeatedly throughout the day.  Protect yourself by wearing long sleeves, pants, a wide-brimmed hat, and sunglasses whenever you are outside.  Heart disease, diabetes, and high blood pressure  High blood pressure causes heart disease and increases  the risk of stroke. High blood pressure is more likely to develop in: ? People who have blood pressure in the high end of the normal range (130-139/85-89 mm Hg). ? People who are overweight or obese. ? People who are African American.  If you are 69-59 years of age, have your blood pressure checked every 3-5 years. If you are 30 years of age or older, have your blood pressure checked every year. You should have your blood pressure measured twice-once when you are at a hospital or clinic, and once when you are not at a hospital or clinic. Record the average of the two measurements. To check your blood pressure when you are not at a hospital or clinic, you can use: ? An automated blood pressure machine at a pharmacy. ? A home blood pressure monitor.  If you are between 66 years and 44 years old, ask your health care provider if you should take aspirin to prevent strokes.  Have regular diabetes screenings. This involves taking a blood sample to check your fasting blood sugar level. ? If you are at a normal weight and have a low risk for diabetes, have this test once every three years after 82 years of age. ? If you are overweight and have a high risk for diabetes, consider being tested at a younger age or more often. Preventing infection Hepatitis B  If you have a higher risk for hepatitis B, you should be screened for this virus. You are considered at high risk for hepatitis B if: ? You were born in a country where hepatitis B is common. Ask your health care provider which countries are considered high risk. ? Your parents were born in a high-risk country, and you have not been immunized against hepatitis B (hepatitis B vaccine). ? You have HIV or AIDS. ? You use needles to inject street drugs. ? You live with someone who has hepatitis B. ? You have had sex with someone who has hepatitis B. ? You get hemodialysis treatment. ? You take certain medicines for conditions, including cancer, organ  transplantation, and autoimmune conditions.  Hepatitis C  Blood testing is recommended for: ? Everyone born from [birth year] through 1963. ? Anyone with known risk factors for hepatitis C.  Sexually transmitted infections (STIs)  You should be screened for sexually transmitted infections (STIs) including gonorrhea and chlamydia if: ? You are sexually active and are younger than 82 years of age. ?  You are older than 82 years of age and your health care provider tells you that you are at risk for this type of infection. ? Your sexual activity has changed since you were last screened and you are at an increased risk for chlamydia or gonorrhea. Ask your health care provider if you are at risk.  If you do not have HIV, but are at risk, it may be recommended that you take a prescription medicine daily to prevent HIV infection. This is called pre-exposure prophylaxis (PrEP). You are considered at risk if: ? You are sexually active and do not regularly use condoms or know the HIV status of your partner(s). ? You take drugs by injection. ? You are sexually active with a partner who has HIV.  Talk with your health care provider about whether you are at high risk of being infected with HIV. If you choose to begin PrEP, you should first be tested for HIV. You should then be tested every 3 months for as long as you are taking PrEP. Pregnancy  If you are premenopausal and you may become pregnant, ask your health care provider about preconception counseling.  If you may become pregnant, take 400 to 800 micrograms (mcg) of folic acid every day.  If you want to prevent pregnancy, talk to your health care provider about birth control (contraception). Osteoporosis and menopause  Osteoporosis is a disease in which the bones lose minerals and strength with aging. This can result in serious bone fractures. Your risk for osteoporosis can be identified using a bone density scan.  If you are 59 years of age or  older, or if you are at risk for osteoporosis and fractures, ask your health care provider if you should be screened.  Ask your health care provider whether you should take a calcium or vitamin D supplement to lower your risk for osteoporosis.  Menopause may have certain physical symptoms and risks.  Hormone replacement therapy may reduce some of these symptoms and risks. Talk to your health care provider about whether hormone replacement therapy is right for you. Follow these instructions at home:  Schedule regular health, dental, and eye exams.  Stay current with your immunizations.  Do not use any tobacco products including cigarettes, chewing tobacco, or electronic cigarettes.  If you are pregnant, do not drink alcohol.  If you are breastfeeding, limit how much and how often you drink alcohol.  Limit alcohol intake to no more than 1 drink per day for nonpregnant women. One drink equals 12 ounces of beer, 5 ounces of Taylen Osorto, or 1 ounces of hard liquor.  Do not use street drugs.  Do not share needles.  Ask your health care provider for help if you need support or information about quitting drugs.  Tell your health care provider if you often feel depressed.  Tell your health care provider if you have ever been abused or do not feel safe at home. This information is not intended to replace advice given to you by your health care provider. Make sure you discuss any questions you have with your health care provider. Document Released: 02/16/2011 Document Revised: 01/09/2016 Document Reviewed: 05/07/2015 Elsevier Interactive Patient Education  [employer].
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
## 2018-06-24 ENCOUNTER — Other Ambulatory Visit: Payer: Self-pay

## 2018-06-24 NOTE — Telephone Encounter (Signed)
Pt would like to know if she can switch from the liquid to the tablets. Please advise

## 2018-06-26 ENCOUNTER — Other Ambulatory Visit: Payer: Self-pay | Admitting: Internal Medicine

## 2018-06-28 ENCOUNTER — Ambulatory Visit: Payer: Medicare Other | Admitting: Podiatry

## 2018-06-28 ENCOUNTER — Other Ambulatory Visit: Payer: Self-pay | Admitting: Internal Medicine

## 2018-06-28 DIAGNOSIS — D689 Coagulation defect, unspecified: Secondary | ICD-10-CM | POA: Diagnosis not present

## 2018-06-28 DIAGNOSIS — B351 Tinea unguium: Secondary | ICD-10-CM | POA: Diagnosis not present

## 2018-06-28 DIAGNOSIS — M79609 Pain in unspecified limb: Secondary | ICD-10-CM | POA: Diagnosis not present

## 2018-06-28 DIAGNOSIS — Q828 Other specified congenital malformations of skin: Secondary | ICD-10-CM

## 2018-06-28 MED ORDER — GABAPENTIN 300 MG/6ML PO SOLN: 6.0000 mL | Freq: Two times a day (BID) | ORAL | 5 refills | Status: DC | PRN

## 2018-06-28 NOTE — Progress Notes (Signed)
Subjective: 84 y.o. returns the office today for painful, elongated, thickened toenails which she cannot trim herself as well as for painful corns to her 5th toes. Denies any redness or drainage around the nails. The wound on the right ankle is doing well she denies any drainage more from the area.  And started to scab over.  Denies any acute changes since last appointment and no new complaints today. Denies any systemic complaints such as fevers, chills, nausea, vomiting.   PCP: Plotnikov, Evie Lacks, MD  Objective: AAO 3, NAD DP/PT pulses palpable, CRT less than 3 seconds Nails hypertrophic, dystrophic, elongated, brittle, discolored 10. There is tenderness overlying the nails 1-5 bilaterally. There is no surrounding erythema or drainage along the nail sites. Hyperkeratotic lesions bilateral fifth toes.  Upon debridement no underlying ulceration drainage or any signs of infection. Hammertoes Scab overlying the previously the lateral malleolus.  No edema, erythema, drainage approximately appears to be healed. No open lesions or pre-ulcerative lesions are identified. No other areas of tenderness bilateral lower extremities. No overlying edema, erythema, increased warmth. No pain with calf compression, swelling, warmth, erythema.  Assessment: Patient presents with symptomatic onychomycosis; hyperkeratotic lesions Plan: -Treatment options including alternatives, risks, complications were discussed -Nails sharply debrided 10 without complication/bleeding. -Hyperkeratotic lesion sharply debrided x2 without any complications or bleeding offloading pads were dispensed. -Wound on the lateral ankle is healed.  Discussed moisturizer daily. -Discussed daily foot inspection. If there are any changes, to call the office immediately.  -Follow-up in 3 months or sooner if any problems are to arise. In the meantime, encouraged to call the office with any questions, concerns, changes symptoms.  Celesta Gentile, DPM

## 2018-07-06 ENCOUNTER — Telehealth: Payer: Self-pay | Admitting: Podiatry

## 2018-07-06 NOTE — Telephone Encounter (Signed)
Yes, I will be here tomorrow and we can get them for her.

## 2018-07-06 NOTE — Telephone Encounter (Signed)
Pt states Dr. Jacqualyn Posey had said he would give her pads after trimming her toenails and she did not receive them. I told pt if she would stop by and ask for me I would help with the pads.

## 2018-07-06 NOTE — Telephone Encounter (Signed)
Pt was seen 11/12 and has 2 hard spots on both little toes. Dr. suggested pads or something to help with treating. Pt left appt and did not get the pads. States she will be in [place] tomorrow and was wondering if she could come by to pick some up.  Please give her a call

## 2018-07-07 DIAGNOSIS — H6982 Other specified disorders of Eustachian tube, left ear: Secondary | ICD-10-CM | POA: Insufficient documentation

## 2018-07-18 ENCOUNTER — Ambulatory Visit: Payer: Medicare Other | Admitting: Internal Medicine

## 2018-07-18 ENCOUNTER — Encounter: Payer: Self-pay | Admitting: Internal Medicine

## 2018-07-18 DIAGNOSIS — G459 Transient cerebral ischemic attack, unspecified: Secondary | ICD-10-CM

## 2018-07-18 DIAGNOSIS — M2550 Pain in unspecified joint: Secondary | ICD-10-CM

## 2018-07-18 DIAGNOSIS — F411 Generalized anxiety disorder: Secondary | ICD-10-CM

## 2018-07-18 DIAGNOSIS — I4891 Unspecified atrial fibrillation: Secondary | ICD-10-CM

## 2018-07-18 DIAGNOSIS — I10 Essential (primary) hypertension: Secondary | ICD-10-CM

## 2018-07-18 DIAGNOSIS — E538 Deficiency of other specified B group vitamins: Secondary | ICD-10-CM | POA: Diagnosis not present

## 2018-07-18 DIAGNOSIS — R0600 Dyspnea, unspecified: Secondary | ICD-10-CM

## 2018-07-18 DIAGNOSIS — K219 Gastro-esophageal reflux disease without esophagitis: Secondary | ICD-10-CM

## 2018-07-18 MED ORDER — CYANOCOBALAMIN 1000 MCG/ML IJ SOLN
1000.0000 ug | Freq: Once | INTRAMUSCULAR | Status: AC
  Administered 2018-07-18: 1000 ug via INTRAMUSCULAR

## 2018-07-18 NOTE — Progress Notes (Signed)
Subjective:  Patient ID: Alyssa Luna, female    DOB: 1934-07-28  Age: 84 y.o. MRN: [id number]  CC: No chief complaint on file.   HPI Alyssa Luna presents for fatigue, HH, OA and B12 def f/u  Outpatient Medications Prior to Visit  Medication Sig Dispense Refill  . cetirizine (ZYRTEC) 10 MG tablet Take 10 mg by mouth daily as needed for allergies.    . Cholecalciferol (D3 DOTS) 2000 units TBDP Take by mouth daily.    . Cyanocobalamin (VITAMIN B-12 IJ) Inject as directed every 30 (thirty) days.    [name] denosumab (PROLIA) 60 MG/ML SOLN injection Inject 60 mg into the skin every 6 (six) months. Administer in upper arm, thigh, or abdomen Unable to tolerate oral meds Dx severe osteoporosis 1.8 mL 6  . diphenhydrAMINE HCl (BENADRYL ALLERGY CHILDRENS PO) Take by mouth at bedtime as needed. Takes at night when itching    . famotidine (PEPCID) 20 MG tablet Take 1 tablet (20 mg total) by mouth 2 (two) times daily. 180 tablet 1  . Gabapentin 300 MG/6ML SOLN Take 6 mLs by mouth 2 (two) times daily as needed. Take additional 2 ml at bedtime 470 mL 5  . hydrocortisone 2.5 % cream Apply 1 application topically 3 (three) times daily as needed (itching).    . montelukast (SINGULAIR) 10 MG tablet Take 1 tablet (10 mg total) by mouth daily. 30 tablet 11  . Rivaroxaban (XARELTO) 15 MG TABS tablet Take 15 mg by mouth daily.     . Simethicone (GAS-X PO) Take 1 tablet by mouth daily as needed (flatulence).     . Wheat Dextrin (BENEFIBER PO) Take 1 tablet by mouth daily.    [name] amiodarone (PACERONE) 200 MG tablet Take 200 mg by mouth daily.     [name] amLODipine (NORVASC) 5 MG tablet TAKE 1 TABLET BY MOUTH EVERY DAY 30 tablet 5  . neomycin-polymyxin-hydrocortisone (CORTISPORIN) OTIC solution Place 3 drops into the left ear 3 (three) times daily. 10 mL 3  . telmisartan (MICARDIS) 40 MG tablet Take 1 tablet (40 mg total) by mouth daily. 30 tablet 0   No facility-administered medications prior to visit.      ROS: Review of Systems  Constitutional: Negative for activity change, appetite change, chills, fatigue and unexpected weight change.  HENT: Negative for congestion, mouth sores and sinus pressure.   Eyes: Negative for visual disturbance.  Respiratory: Negative for cough and chest tightness.   Gastrointestinal: Negative for abdominal pain and nausea.  Genitourinary: Negative for difficulty urinating, frequency and vaginal pain.  Musculoskeletal: Positive for arthralgias, back pain and gait problem.  Skin: Negative for pallor and rash.  Neurological: Negative for dizziness, tremors, weakness, numbness and headaches.  Psychiatric/Behavioral: Negative for confusion, sleep disturbance and suicidal ideas. The patient is nervous/anxious.     Objective:  BP 136/74 (BP Location: Left Arm, Patient Position: Sitting, Cuff Size: Normal)   Pulse (!) 52   Temp 98 F (36.7 C) (Oral)   Ht 5' 2"  (1.575 m)   Wt 122 lb (55.3 kg)   SpO2 96%   BMI 22.31 kg/m   BP Readings from Last 3 Encounters:  07/18/18 136/74  06/16/18 128/63  06/15/18 110/62    Wt Readings from Last 3 Encounters:  07/18/18 122 lb (55.3 kg)  06/16/18 121 lb (54.9 kg)  06/15/18 121 lb 8 oz (55.1 kg)    Physical Exam  Constitutional: She appears well-developed. No distress.  HENT:  Head: Normocephalic.  Right  Ear: External ear normal.  Left Ear: External ear normal.  Nose: Nose normal.  Mouth/Throat: Oropharynx is clear and moist.  Eyes: Pupils are equal, round, and reactive to light. Conjunctivae are normal. Right eye exhibits no discharge. Left eye exhibits no discharge.  Neck: Normal range of motion. Neck supple. No JVD present. No tracheal deviation present. No thyromegaly present.  Cardiovascular: Normal rate, regular rhythm and normal heart sounds.  Pulmonary/Chest: No stridor. No respiratory distress. She has no wheezes.  Abdominal: Soft. Bowel sounds are normal. She exhibits no distension and no mass. There  is no tenderness. There is no rebound and no guarding.  Musculoskeletal: She exhibits tenderness. She exhibits no edema.  Lymphadenopathy:    She has no cervical adenopathy.  Neurological: She displays normal reflexes. No cranial nerve deficit. She exhibits normal muscle tone. Coordination normal.  Skin: No rash noted. No erythema.  Psychiatric: She has a normal mood and affect. Her behavior is normal. Judgment and thought content normal.  knees w/pain  Lab Results  Component Value Date   WBC 5.3 04/27/2018   HGB 12.1 04/27/2018   HCT 39.2 04/27/2018   PLT 215 04/27/2018   GLUCOSE 104 (H) 04/27/2018   CHOL 160 02/25/2018   TRIG 119.0 02/25/2018   HDL 42.90 02/25/2018   LDLDIRECT 178.0 09/24/2011   LDLCALC 94 02/25/2018   ALT 29 04/27/2018   AST 49 (H) 04/27/2018   NA 141 04/27/2018   K 3.9 04/27/2018   CL 104 04/27/2018   CREATININE 1.02 (H) 04/27/2018   BUN 11 04/27/2018   CO2 28 04/27/2018   TSH 3.13 06/22/2017   INR 1.7 10/18/2015    Dg Abdomen Acute W/chest  Result Date: 04/27/2018 CLINICAL DATA:  Intermittent left-sided abdominal pain for 1 week. Nausea. EXAM: DG ABDOMEN ACUTE W/ 1V CHEST COMPARISON:  CT 3 days prior 04/24/2018 at [facility]. FINDINGS: The cardiomediastinal contours are normal. Moderate hiatal hernia. The lungs are clear. Surgical clips in the left axilla. There is no free intra-abdominal air. No dilated bowel loops to suggest obstruction. Small to moderate volume of stool throughout the colon. Pelvic phleboliths. No radiopaque calculi. Scoliotic curvature in the thoracolumbar spine. Vertebral augmentation at the thoracolumbar junction. No acute osseous abnormalities are seen. IMPRESSION: 1. Normal bowel gas pattern. Small to moderate colonic stool burden. Note: Patient had abdominal CT 3 days prior [facility] demonstrating no acute abnormality. 2. Moderate hiatal hernia. 3.  No acute pulmonary process. Electronically Signed   By: Keith Rake M.D.   On: 04/27/2018 05:40    Assessment & Plan:   There are no diagnoses linked to this encounter.   No orders of the defined types were placed in this encounter.    Follow-up: No follow-ups on file.  Walker Kehr, MD

## 2018-07-18 NOTE — Assessment & Plan Note (Signed)
Chronic sx's

## 2018-07-18 NOTE — Assessment & Plan Note (Addendum)
Mild and Chronic CT Ca scoring test was offered

## 2018-07-18 NOTE — Assessment & Plan Note (Addendum)
Chronic  On NAS diet

## 2018-07-18 NOTE — Assessment & Plan Note (Signed)
Off meds

## 2018-07-18 NOTE — Patient Instructions (Addendum)
Google "Best robot vaccumes"  Trekking poles/pole for walking/support (?)  Cardiac CT calcium scoring test $150   Computed tomography, more commonly known as a CT or CAT scan, is a diagnostic medical imaging test. Like traditional x-rays, it produces multiple images or pictures of the inside of the body. The cross-sectional images generated during a CT scan can be reformatted in multiple planes. They can even generate three-dimensional images. These images can be viewed on a computer monitor, printed on film or by a 3D printer, or transferred to a CD or DVD. CT images of internal organs, bones, soft tissue and blood vessels provide greater detail than traditional x-rays, particularly of soft tissues and blood vessels. A cardiac CT scan for coronary calcium is a non-invasive way of obtaining information about the presence, location and extent of calcified plaque in the coronary arteries-the vessels that supply oxygen-containing blood to the heart muscle. Calcified plaque results when there is a build-up of fat and other substances under the inner layer of the artery. This material can calcify which signals the presence of atherosclerosis, a disease of the vessel wall, also called coronary artery disease (CAD). People with this disease have an increased risk for heart attacks. In addition, over time, progression of plaque build up (CAD) can narrow the arteries or even close off blood flow to the heart. The result may be chest pain, sometimes called "angina," or a heart attack. Because calcium is a marker of CAD, the amount of calcium detected on a cardiac CT scan is a helpful prognostic tool. The findings on cardiac CT are expressed as a calcium score. Another name for this test is coronary artery calcium scoring.  What are some common uses of the procedure? The goal of cardiac CT scan for calcium scoring is to determine if CAD is present and to what extent, even if there are no symptoms. It is a  screening study that may be recommended by a physician for patients with risk factors for CAD but no clinical symptoms. The major risk factors for CAD are: . high blood cholesterol levels  . family history of heart attacks  . diabetes  . high blood pressure  . cigarette smoking  . overweight or obese  . physical inactivity   A negative cardiac CT scan for calcium scoring shows no calcification within the coronary arteries. This suggests that CAD is absent or so minimal it cannot be seen by this technique. The chance of having a heart attack over the next two to five years is very low under these circumstances. A positive test means that CAD is present, regardless of whether or not the patient is experiencing any symptoms. The amount of calcification-expressed as the calcium score-may help to predict the likelihood of a myocardial infarction (heart attack) in the coming years and helps your medical doctor or cardiologist decide whether the patient may need to take preventive medicine or undertake other measures such as diet and exercise to lower the risk for heart attack. The extent of CAD is graded according to your calcium score:  Calcium Score  Presence of CAD  0 No evidence of CAD   1-10 Minimal evidence of CAD  11-100 Mild evidence of CAD  101-400 Moderate evidence of CAD  Over 400 Extensive evidence of CAD

## 2018-07-18 NOTE — Assessment & Plan Note (Signed)
On B12 

## 2018-07-18 NOTE — Assessment & Plan Note (Signed)
Xarelto

## 2018-07-18 NOTE — Assessment & Plan Note (Signed)
On Xarelto Pacerone

## 2018-07-18 NOTE — Assessment & Plan Note (Signed)
Pepcid?

## 2018-09-05 ENCOUNTER — Ambulatory Visit: Payer: Self-pay | Admitting: *Deleted

## 2018-09-05 NOTE — Telephone Encounter (Signed)
  Reason for Disposition . [1] Caller requesting NON-URGENT health information AND [2] PCP's office is the best resource  Answer Assessment - Initial Assessment Questions 1. REASON FOR CALL or QUESTION: "What is your reason for calling today?" or "How can I best help you?" or "What question do you have that I can help answer?"     Can prolia be taken on the same day as a dental cleaning>  Protocols used: INFORMATION ONLY CALL-A-AH

## 2018-09-05 NOTE — Telephone Encounter (Signed)
Tried to reach patient several times on her cell phone [phone] able to reach her, goes to voicemail---I need to know exactly what kind of cleaning she is having at dentist before I can advise if ok---I will try to contact patient tomorrow 09/06/17 to discuss

## 2018-09-05 NOTE — Telephone Encounter (Signed)
Will route to office for provider review.

## 2018-09-05 NOTE — Telephone Encounter (Signed)
Patient question based on triage note: Can prolia be taken on the same day as a dental cleaning? Please advise.

## 2018-09-06 NOTE — Telephone Encounter (Signed)
Tried to reach patient again this morning, she is not at home, no answer on cell phone---left message for patient to call back and be transferred to [place] office

## 2018-09-06 NOTE — Telephone Encounter (Signed)
Patient is having a normal regular dental cleaning on 09/09/18, patient advised that this should not interfere with prolia, we only need to be concerned if the cleaning or dental work is invasive into gum/jawline----if patient wants to wait additional month before getting prolia (normally due again in feb/2020), she can wait if this makes her more comfortable---I will call patient with summary of benefits closer to February to discuss

## 2018-09-13 ENCOUNTER — Ambulatory Visit (INDEPENDENT_AMBULATORY_CARE_PROVIDER_SITE_OTHER): Payer: Medicare Other

## 2018-09-13 DIAGNOSIS — E538 Deficiency of other specified B group vitamins: Secondary | ICD-10-CM

## 2018-09-13 MED ORDER — CYANOCOBALAMIN 1000 MCG/ML IJ SOLN
1000.0000 ug | Freq: Once | INTRAMUSCULAR | Status: AC
  Administered 2018-09-13: 1000 ug via INTRAMUSCULAR

## 2018-09-20 NOTE — Progress Notes (Addendum)
I will Talk to him today about it. Thank you    Medical screening examination/treatment/procedure(s) were performed by non-physician practitioner and as supervising physician I was immediately available for consultation/collaboration. I agree with above. Lew Dawes, MD

## 2018-09-29 ENCOUNTER — Encounter: Payer: Self-pay | Admitting: Family Medicine

## 2018-09-29 ENCOUNTER — Ambulatory Visit: Payer: Medicare Other | Admitting: Podiatry

## 2018-09-29 ENCOUNTER — Ambulatory Visit: Payer: Medicare Other | Admitting: Family Medicine

## 2018-09-29 VITALS — BP 126/70 | HR 65 | Temp 97.6°F | Ht 62.0 in | Wt 126.0 lb

## 2018-09-29 DIAGNOSIS — Q828 Other specified congenital malformations of skin: Secondary | ICD-10-CM

## 2018-09-29 DIAGNOSIS — M79676 Pain in unspecified toe(s): Secondary | ICD-10-CM | POA: Diagnosis not present

## 2018-09-29 DIAGNOSIS — D689 Coagulation defect, unspecified: Secondary | ICD-10-CM

## 2018-09-29 DIAGNOSIS — Z9229 Personal history of other drug therapy: Secondary | ICD-10-CM

## 2018-09-29 DIAGNOSIS — J0100 Acute maxillary sinusitis, unspecified: Secondary | ICD-10-CM | POA: Diagnosis not present

## 2018-09-29 DIAGNOSIS — B351 Tinea unguium: Secondary | ICD-10-CM | POA: Diagnosis not present

## 2018-09-29 DIAGNOSIS — M79609 Pain in unspecified limb: Principal | ICD-10-CM

## 2018-09-29 MED ORDER — CEFDINIR 250 MG/5ML PO SUSR: 300.0000 mg | Freq: Two times a day (BID) | ORAL | 0 refills | Status: DC

## 2018-09-29 NOTE — Patient Instructions (Signed)
Onychomycosis/Fungal Toenails  WHAT IS IT? An infection that lies within the keratin of your nail plate that is caused by a fungus.  WHY ME? Fungal infections affect all ages, sexes, races, and creeds.  There may be many factors that predispose you to a fungal infection such as age, coexisting medical conditions such as diabetes, or an autoimmune disease; stress, medications, fatigue, genetics, etc.  Bottom line: fungus thrives in a warm, moist environment and your shoes offer such a location.  IS IT CONTAGIOUS? Theoretically, yes.  You do not want to share shoes, nail clippers or files with someone who has fungal toenails.  Walking around barefoot in the same room or sleeping in the same bed is unlikely to transfer the organism.  It is important to realize, however, that fungus can spread easily from one nail to the next on the same foot.  HOW DO WE TREAT THIS?  There are several ways to treat this condition.  Treatment may depend on many factors such as age, medications, pregnancy, liver and kidney conditions, etc.  It is best to ask your doctor which options are available to you.  1. No treatment.   Unlike many other medical concerns, you can live with this condition.  However for many people this can be a painful condition and may lead to ingrown toenails or a bacterial infection.  It is recommended that you keep the nails cut short to help reduce the amount of fungal nail. 2. Topical treatment.  These range from herbal remedies to prescription strength nail lacquers.  About 40-50% effective, topicals require twice daily application for approximately 9 to 12 months or until an entirely new nail has grown out.  The most effective topicals are medical grade medications available through physicians offices. 3. Oral antifungal medications.  With an 80-90% cure rate, the most common oral medication requires 3 to 4 months of therapy and stays in your system for a year as the new nail grows out.  Oral  antifungal medications do require blood work to make sure it is a safe drug for you.  A liver function panel will be performed prior to starting the medication and after the first month of treatment.  It is important to have the blood work performed to avoid any harmful side effects.  In general, this medication safe but blood work is required. 4. Laser Therapy.  This treatment is performed by applying a specialized laser to the affected nail plate.  This therapy is noninvasive, fast, and non-painful.  It is not covered by insurance and is therefore, out of pocket.  The results have been very good with a 80-95% cure rate.  [facility] is the only practice in the area to offer this therapy. Permanent Nail Avulsion.  Removing the entire nail so that a new nail will not grow back.Corns and Calluses Corns are small areas of thickened skin that occur on the top, sides, or tip of a toe. They contain a cone-shaped core with a point that can press on a nerve below. This causes pain.  Calluses are areas of thickened skin that can occur anywhere on the body, including the hands, fingers, palms, soles of the feet, and heels. Calluses are usually larger than corns. What are the causes? Corns and calluses are caused by rubbing (friction) or pressure, such as from shoes that are too tight or do not fit properly. What increases the risk? Corns are more likely to develop in people who have misshapen   toes (toe deformities), such as hammer toes. Calluses can occur with friction to any area of the skin. They are more likely to develop in people who:  Work with their hands.  Wear shoes that fit poorly, are too tight, or are high-heeled.  Have toe deformities. What are the signs or symptoms? Symptoms of a corn or callus include:  A hard growth on the skin.  Pain or tenderness under the skin.  Redness and swelling.  Increased discomfort while wearing tight-fitting shoes, if your feet are affected. If a  corn or callus becomes infected, symptoms may include:  Redness and swelling that gets worse.  Pain.  Fluid, blood, or pus draining from the corn or callus. How is this diagnosed? Corns and calluses may be diagnosed based on your symptoms, your medical history, and a physical exam. How is this treated? Treatment for corns and calluses may include:  Removing the cause of the friction or pressure. This may involve: ? Changing your shoes. ? Wearing shoe inserts (orthotics) or other protective layers in your shoes, such as a corn pad. ? Wearing gloves.  Applying medicine to the skin (topical medicine) to help soften skin in the hardened, thickened areas.  Removing layers of dead skin with a file to reduce the size of the corn or callus.  Removing the corn or callus with a scalpel or laser.  Taking antibiotic medicines, if your corn or callus is infected.  Having surgery, if a toe deformity is the cause. Follow these instructions at home:   Take over-the-counter and prescription medicines only as told by your health care provider.  If you were prescribed an antibiotic, take it as told by your health care provider. Do not stop taking it even if your condition starts to improve.  Wear shoes that fit well. Avoid wearing high-heeled shoes and shoes that are too tight or too loose.  Wear any padding, protective layers, gloves, or orthotics as told by your health care provider.  Soak your hands or feet and then use a file or pumice stone to soften your corn or callus. Do this as told by your health care provider.  Check your corn or callus every day for symptoms of infection. Contact a health care provider if you:  Notice that your symptoms do not improve with treatment.  Have redness or swelling that gets worse.  Notice that your corn or callus becomes painful.  Have fluid, blood, or pus coming from your corn or callus.  Have new symptoms. Summary  Corns are small areas of  thickened skin that occur on the top, sides, or tip of a toe.  Calluses are areas of thickened skin that can occur anywhere on the body, including the hands, fingers, palms, and soles of the feet. Calluses are usually larger than corns.  Corns and calluses are caused by rubbing (friction) or pressure, such as from shoes that are too tight or do not fit properly.  Treatment may include wearing any padding, protective layers, gloves, or orthotics as told by your health care provider. This information is not intended to replace advice given to you by your health care provider. Make sure you discuss any questions you have with your health care provider. Document Released: 05/09/2004 Document Revised: 06/16/2017 Document Reviewed: 06/16/2017 Elsevier Interactive Patient Education  2019 Elsevier Inc[employer]  

## 2018-09-29 NOTE — Progress Notes (Signed)
Patient ID: HAWLEY PAVIA, female   DOB: May 24, 1934, 85 y.o.   MRN: [id number]  PCP: Cassandria Anger, MD  Subjective:  Alyssa Luna is a 85 y.o. year old very pleasant female patient who presents with symptoms including nasal congestion, sinus pressure,  mild sore throat, mild dry cough has been present  -started: 2 weeks, symptoms are not improving -previous treatments: Zyrtec has provided limited benefit -sick contacts/travel/risks: denies flu exposure. No recent sick contact exposure.  -Hx of: alpha gal allergy post tick bite  ROS-denies fever, SOB, NVD, tooth pain  Pertinent Past Medical History- Atrial fibrillation, TIA, HTN, allergic rhinitis, GERD  Medications- reviewed  Current Outpatient Medications  Medication Sig Dispense Refill  . amiodarone (PACERONE) 200 MG tablet Take 200 mg by mouth daily.    . cetirizine (ZYRTEC) 10 MG tablet Take 10 mg by mouth daily as needed for allergies.    . Cholecalciferol (D3 DOTS) 2000 units TBDP Take by mouth daily.    . Cyanocobalamin (VITAMIN B-12 IJ) Inject as directed every 30 (thirty) days.    [name] denosumab (PROLIA) 60 MG/ML SOLN injection Inject 60 mg into the skin every 6 (six) months. Administer in upper arm, thigh, or abdomen Unable to tolerate oral meds Dx severe osteoporosis 1.8 mL 6  . diphenhydrAMINE HCl (BENADRYL ALLERGY CHILDRENS PO) Take by mouth at bedtime as needed. Takes at night when itching    . famotidine (PEPCID) 20 MG tablet Take 1 tablet (20 mg total) by mouth 2 (two) times daily. 180 tablet 1  . gabapentin (NEURONTIN) 600 MG tablet TAKE 1/2 TABLET BY MOUTH 4 TIMES A DAY    . montelukast (SINGULAIR) 10 MG tablet Take 1 tablet (10 mg total) by mouth daily. 30 tablet 11  . Rivaroxaban (XARELTO) 15 MG TABS tablet Take 15 mg by mouth daily.     . Simethicone (GAS-X PO) Take 1 tablet by mouth daily as needed (flatulence).     . Wheat Dextrin (BENEFIBER PO) Take 1 tablet by mouth daily.    [name] amiodarone (PACERONE)  200 MG tablet Take 200 mg by mouth daily.     . Gabapentin 300 MG/6ML SOLN Take 6 mLs by mouth 2 (two) times daily as needed. Take additional 2 ml at bedtime (Patient not taking: Reported on 09/29/2018) 470 mL 5  . hydrocortisone 2.5 % cream Apply 1 application topically 3 (three) times daily as needed (itching).     No current facility-administered medications for this visit.     Objective: BP 126/70 (BP Location: Left Arm, Patient Position: Sitting, Cuff Size: Normal)   Pulse 65   Temp 97.6 F (36.4 C) (Oral)   Ht 5' 2"  (1.575 m)   Wt 126 lb 0.6 oz (57.2 kg)   SpO2 96%   BMI 23.05 kg/m  Gen: NAD, resting comfortably HEENT: Turbinates erythematous, TMs normal bilaterally, pharynx mildly erythematous with no exudate or edema, +maxiallary  sinus tenderness CV: Normal rate, regular rhythm, and normal heart sounds Lungs: CTAB no crackles, wheeze, rhonchi Abdomen: soft/nontender/nondistended/normal bowel sounds. No rebound or guarding.  Ext: no edema Skin: warm, dry, no rash Neuro: grossly normal, moves all extremities  Assessment/Plan: 1. Acute maxillary sinusitis, recurrence not specified Exam and history are most consistent with sinusitis. Many drug allergies present, some related to capsule form per patient due to alpha gal allergy. She has successfully taken PCN family before however she reports that she does not feel well when taking this class of drugs. Opted to avoid  azithromycin due to amiodarone use and initiated cefdinir in liquid form to avoid capsule formulation.  Advised patient on supportive measures:  Get rest, drink plenty of fluids, and follow up if fever >100, if symptoms worsen or if symptoms are not improved in 3 to 4 days. Patient verbalizes understanding.    - cefdinir (OMNICEF) 250 MG/5ML suspension; Take 6 mLs (300 mg total) by mouth 2 (two) times daily for 7 days.  Dispense: 84 mL; Refill: 0   Finally, we reviewed reasons to return to care including if symptoms  worsen or persist or new concerns arise- once again particularly shortness of breath or fever.   Laurita Quint, FNP

## 2018-09-29 NOTE — Patient Instructions (Signed)
It was a pleasure to meet you today.  Please take antibiotic as directed.  Nasal saline rinses can be helpful.  Please continue Zyrtec.  Please drink water, rest, and follow up if symptoms do not improve in 3 to 4 days, worsen, or you develop a fever >101.   Sinusitis, Adult Sinusitis is soreness and swelling (inflammation) of your sinuses. Sinuses are hollow spaces in the bones around your face. They are located:  Around your eyes.  In the middle of your forehead.  Behind your nose.  In your cheekbones. Your sinuses and nasal passages are lined with a fluid called mucus. Mucus drains out of your sinuses. Swelling can trap mucus in your sinuses. This lets germs (bacteria, virus, or fungus) grow, which leads to infection. Most of the time, this condition is caused by a virus. What are the causes? This condition is caused by:  Allergies.  Asthma.  Germs.  Things that block your nose or sinuses.  Growths in the nose (nasal polyps).  Chemicals or irritants in the air.  Fungus (rare). What increases the risk? You are more likely to develop this condition if:  You have a weak body defense system (immune system).  You do a lot of swimming or diving.  You use nasal sprays too much.  You smoke. What are the signs or symptoms? The main symptoms of this condition are pain and a feeling of pressure around the sinuses. Other symptoms include:  Stuffy nose (congestion).  Runny nose (drainage).  Swelling and warmth in the sinuses.  Headache.  Toothache.  A cough that may get worse at night.  Mucus that collects in the throat or the back of the nose (postnasal drip).  Being unable to smell and taste.  Being very tired (fatigue).  A fever.  Sore throat.  Bad breath. How is this diagnosed? This condition is diagnosed based on:  Your symptoms.  Your medical history.  A physical exam.  Tests to find out if your condition is short-term (acute) or  long-term (chronic). Your doctor may: ? Check your nose for growths (polyps). ? Check your sinuses using a tool that has a light (endoscope). ? Check for allergies or germs. ? Do imaging tests, such as an MRI or CT scan. How is this treated? Treatment for this condition depends on the cause and whether it is short-term or long-term.  If caused by a virus, your symptoms should go away on their own within 10 days. You may be given medicines to relieve symptoms. They include: ? Medicines that shrink swollen tissue in the nose. ? Medicines that treat allergies (antihistamines). ? A spray that treats swelling of the nostrils. ? Rinses that help get rid of thick mucus in your nose (nasal saline washes).  If caused by bacteria, your doctor may wait to see if you will get better without treatment. You may be given antibiotic medicine if you have: ? A very bad infection. ? A weak body defense system.  If caused by growths in the nose, you may need to have surgery. Follow these instructions at home: Medicines  Take, use, or apply over-the-counter and prescription medicines only as told by your doctor. These may include nasal sprays.  If you were prescribed an antibiotic medicine, take it as told by your doctor. Do not stop taking the antibiotic even if you start to feel better. Hydrate and humidify   Drink enough water to keep your pee (urine) pale yellow.  Use a cool mist  humidifier to keep the humidity level in your home above 50%.  Breathe in steam for 10-15 minutes, 3-4 times a day, or as told by your doctor. You can do this in the bathroom while a hot shower is running.  Try not to spend time in cool or dry air. Rest  Rest as much as you can.  Sleep with your head raised (elevated).  Make sure you get enough sleep each night. General instructions   Put a warm, moist washcloth on your face 3-4 times a day, or as often as told by your doctor. This will help with  discomfort.  Wash your hands often with soap and water. If there is no soap and water, use hand sanitizer.  Do not smoke. Avoid being around people who are smoking (secondhand smoke).  Keep all follow-up visits as told by your doctor. This is important. Contact a doctor if:  You have a fever.  Your symptoms get worse.  Your symptoms do not get better within 10 days. Get help right away if:  You have a very bad headache.  You cannot stop throwing up (vomiting).  You have very bad pain or swelling around your face or eyes.  You have trouble seeing.  You feel confused.  Your neck is stiff.  You have trouble breathing. Summary  Sinusitis is swelling of your sinuses. Sinuses are hollow spaces in the bones around your face.  This condition is caused by tissues in your nose that become inflamed or swollen. This traps germs. These can lead to infection.  If you were prescribed an antibiotic medicine, take it as told by your doctor. Do not stop taking it even if you start to feel better.  Keep all follow-up visits as told by your doctor. This is important. This information is not intended to replace advice given to you by your health care provider. Make sure you discuss any questions you have with your health care provider. Document Released: 01/20/2008 Document Revised: 01/03/2018 Document Reviewed: 01/03/2018 Elsevier Interactive Patient Education  2019 [employer].

## 2018-09-30 ENCOUNTER — Other Ambulatory Visit: Payer: Self-pay | Admitting: Internal Medicine

## 2018-10-03 ENCOUNTER — Encounter: Payer: Self-pay | Admitting: Podiatry

## 2018-10-03 NOTE — Progress Notes (Signed)
Subjective: Alyssa Luna presents today with painful, thick toenails 1-5 b/l that she cannot cut and which interfere with daily activities.  Pain is aggravated when wearing enclosed shoe gear.  Plotnikov, Evie Lacks, MD is her PCP.  Patient is on blood thinner, Xarelto.  Patient states digital pad given to her on last visit was not helpful.   Current Outpatient Medications:  .  amiodarone (PACERONE) 200 MG tablet, Take 200 mg by mouth daily., Disp: , Rfl:  .  cefdinir (OMNICEF) 250 MG/5ML suspension, Take 6 mLs (300 mg total) by mouth 2 (two) times daily for 7 days., Disp: 84 mL, Rfl: 0 .  cetirizine (ZYRTEC) 10 MG tablet, Take 10 mg by mouth daily as needed for allergies., Disp: , Rfl:  .  Cholecalciferol (D3 DOTS) 2000 units TBDP, Take by mouth daily., Disp: , Rfl:  .  Cyanocobalamin (VITAMIN B-12 IJ), Inject as directed every 30 (thirty) days., Disp: , Rfl:  .  denosumab (PROLIA) 60 MG/ML SOLN injection, Inject 60 mg into the skin every 6 (six) months. Administer in upper arm, thigh, or abdomen Unable to tolerate oral meds Dx severe osteoporosis, Disp: 1.8 mL, Rfl: 6 .  diphenhydrAMINE HCl (BENADRYL ALLERGY CHILDRENS PO), Take by mouth at bedtime as needed. Takes at night when itching, Disp: , Rfl:  .  famotidine (PEPCID) 20 MG tablet, Take 1 tablet (20 mg total) by mouth 2 (two) times daily., Disp: 180 tablet, Rfl: 1 .  gabapentin (NEURONTIN) 600 MG tablet, TAKE 1/2 TABLET BY MOUTH 4 TIMES A DAY, Disp: , Rfl:  .  Gabapentin 300 MG/6ML SOLN, Take 6 mLs by mouth 2 (two) times daily as needed. Take additional 2 ml at bedtime, Disp: 470 mL, Rfl: 5 .  hydrocortisone 2.5 % cream, Apply 1 application topically 3 (three) times daily as needed (itching)., Disp: , Rfl:  .  Rivaroxaban (XARELTO) 15 MG TABS tablet, Take 15 mg by mouth daily. , Disp: , Rfl:  .  Simethicone (GAS-X PO), Take 1 tablet by mouth daily as needed (flatulence). , Disp: , Rfl:  .  Wheat Dextrin (BENEFIBER PO), Take 1  tablet by mouth daily., Disp: , Rfl:  .  amiodarone (PACERONE) 200 MG tablet, Take 200 mg by mouth daily. , Disp: , Rfl:  .  montelukast (SINGULAIR) 10 MG tablet, TAKE 1 TABLET BY MOUTH EVERY DAY, Disp: 90 tablet, Rfl: 1  Allergies  Allergen Reactions  . Beef Extract Other (See Comments)    Alpha-gal allergy testing positive June 2018  . Galactose Nausea Only    Per patient, cannot have alpha-gal since tic bite episode.  . Pravastatin Anaphylaxis and Other (See Comments)    Legs ache  . Acetaminophen Other (See Comments)  . Colchicine Other (See Comments)  . Doxycycline Other (See Comments)    Nausea - able to take w/food  . Shellfish Allergy Other (See Comments)    Unknown  . Tikosyn [Dofetilide] Other (See Comments)    Unknown reaction and was told never to take it due to problems during sleep   . Actonel [Risedronate Sodium] Other (See Comments)    Not known  . Alendronate Other (See Comments)    NOT KNOWN  . Augmentin [Amoxicillin-Pot Clavulanate] Rash    Has patient had a PCN reaction causing immediate rash, facial/tongue/throat swelling, SOB or lightheadedness with hypotension: Yes Has patient had a PCN reaction causing severe rash involving mucus membranes or skin necrosis:No Has patient had a PCN reaction that required hospitalization: No  Has patient had a PCN reaction occurring within the last 10 years: No If all of the above answers are "NO", then may proceed with Cephalosporin use.   . Ciprofloxacin Nausea Only  . Evista [Raloxifene] Other (See Comments)    Unknown reaction  . Flagyl [Metronidazole] Other (See Comments)    Not known  . Fosamax [Alendronate Sodium] Other (See Comments)    NOT KNOWN  . Gold-Containing Drug Products Other (See Comments)    Per Dr  . Loma Sousa [Escitalopram Oxalate] Other (See Comments)    shaky  . Risedronate Other (See Comments)    Unknown reaction  . Simvastatin Other (See Comments)    REACTION: leg cramps, weakness  . Tramadol  Other (See Comments)    Mental disturbance changes    Objective:  Vascular Examination: Capillary refill time less than 3 seconds x 10 digits  Dorsalis pedis and Posterior tibial pulses palpable b/l  Digital hair present x 10 digits  Skin temperature gradient WNL b/l  Dermatological Examination: Skin with normal turgor, texture and tone b/l  Toenails 1-5 b/l discolored, thick, dystrophic with subungual debris and pain with palpation to nailbeds due to thickness of nails.  Hyperkeratotic lesion noted dorsal aspect of the right fifth digit PIPJ.  There is exquisite tenderness to palpation of the area.  There is no erythema, no edema, no drainage, no flocculence noted.  There is no underlying wound.  Musculoskeletal: Muscle strength 5/5 to all LE muscle groups bilaterally.  Hammertoe right fifth digit with corn formation.  No pain, crepitus or joint limitation noted with ROM.   Neurological: Sensation intact with 10 gram monofilament.  Vibratory sensation intact.  Assessment: Painful onychomycosis toenails 1-5 b/l   Plan: 1. Toenails 1-5 b/l were debrided in length and girth without iatrogenic bleeding. 2. Hyperkeratotic lesion right fifth digit pared without incident.  Area gently smoothed with burr. 3. Patient to continue soft, supportive shoe gear.  We discussed change in shoe gear on today.  I recommended Arcopedicos which have a very soft upper and would not rub nor irritate her toes.  Patient states she will go to [employer] and give them a try. 4. Avoid self trimming due to use of blood thinner. 5. Patient to report any pedal injuries to medical professional immediately. 6. Follow up 10 weeks. 7. Patient/POA to call should there be a concern in the interim.

## 2018-10-14 ENCOUNTER — Ambulatory Visit: Payer: Medicare Other

## 2018-10-18 ENCOUNTER — Ambulatory Visit: Payer: Medicare Other | Admitting: Internal Medicine

## 2018-10-18 ENCOUNTER — Other Ambulatory Visit (INDEPENDENT_AMBULATORY_CARE_PROVIDER_SITE_OTHER): Payer: Medicare Other

## 2018-10-18 ENCOUNTER — Encounter: Payer: Self-pay | Admitting: Internal Medicine

## 2018-10-18 VITALS — BP 126/64 | HR 64 | Temp 97.8°F | Ht 62.0 in | Wt 127.0 lb

## 2018-10-18 DIAGNOSIS — F4321 Adjustment disorder with depressed mood: Secondary | ICD-10-CM | POA: Diagnosis not present

## 2018-10-18 DIAGNOSIS — I4891 Unspecified atrial fibrillation: Secondary | ICD-10-CM | POA: Diagnosis not present

## 2018-10-18 DIAGNOSIS — R490 Dysphonia: Secondary | ICD-10-CM

## 2018-10-18 DIAGNOSIS — E559 Vitamin D deficiency, unspecified: Secondary | ICD-10-CM | POA: Diagnosis not present

## 2018-10-18 DIAGNOSIS — E538 Deficiency of other specified B group vitamins: Secondary | ICD-10-CM | POA: Diagnosis not present

## 2018-10-18 DIAGNOSIS — F4320 Adjustment disorder, unspecified: Secondary | ICD-10-CM | POA: Insufficient documentation

## 2018-10-18 LAB — HEPATIC FUNCTION PANEL
ALT: 29 U/L (ref 0–35)
AST: 40 U/L — ABNORMAL HIGH (ref 0–37)
Albumin: 4 g/dL (ref 3.5–5.2)
Alkaline Phosphatase: 58 U/L (ref 39–117)
BILIRUBIN DIRECT: 0.1 mg/dL (ref 0.0–0.3)
BILIRUBIN TOTAL: 0.5 mg/dL (ref 0.2–1.2)
TOTAL PROTEIN: 6.6 g/dL (ref 6.0–8.3)

## 2018-10-18 LAB — BASIC METABOLIC PANEL
BUN: 14 mg/dL (ref 6–23)
CALCIUM: 9.1 mg/dL (ref 8.4–10.5)
CO2: 25 mEq/L (ref 19–32)
Chloride: 107 mEq/L (ref 96–112)
Creatinine, Ser: 0.88 mg/dL (ref 0.40–1.20)
GFR: 61.35 mL/min (ref >60.00)
Glucose, Bld: 85 mg/dL (ref 70–99)
Potassium: 4.7 mEq/L (ref 3.5–5.1)
Sodium: 143 mEq/L (ref 135–145)

## 2018-10-18 MED ORDER — CYANOCOBALAMIN 1000 MCG/ML IJ SOLN
1000.0000 ug | Freq: Once | INTRAMUSCULAR | Status: AC
  Administered 2018-10-18: 1000 ug via INTRAMUSCULAR

## 2018-10-18 NOTE — Assessment & Plan Note (Signed)
Cont w/Pepcid

## 2018-10-18 NOTE — Addendum Note (Signed)
Addended by: Karren Cobble on: 10/18/2018 02:54 PM   Modules accepted: Orders

## 2018-10-18 NOTE — Assessment & Plan Note (Signed)
Xarelto

## 2018-10-18 NOTE — Progress Notes (Signed)
Subjective:  Patient ID: Alyssa Luna, female    DOB: 06/01/1934  Age: 85 y.o. MRN: [id number]  CC: No chief complaint on file.   HPI Alyssa Luna presents for B12 def, allergies, osteoporosis f/u. [place].  Husband died in 10/21/2018 - pancreatitis  Outpatient Medications Prior to Visit  Medication Sig Dispense Refill  . amiodarone (PACERONE) 200 MG tablet Take 200 mg by mouth daily.    . cetirizine (ZYRTEC) 10 MG tablet Take 10 mg by mouth daily as needed for allergies.    . Cholecalciferol (D3 DOTS) 2000 units TBDP Take by mouth daily.    . Cyanocobalamin (VITAMIN B-12 IJ) Inject as directed every 30 (thirty) days.    [name] denosumab (PROLIA) 60 MG/ML SOLN injection Inject 60 mg into the skin every 6 (six) months. Administer in upper arm, thigh, or abdomen Unable to tolerate oral meds Dx severe osteoporosis 1.8 mL 6  . diphenhydrAMINE HCl (BENADRYL ALLERGY CHILDRENS PO) Take by mouth at bedtime as needed. Takes at night when itching    . famotidine (PEPCID) 20 MG tablet Take 1 tablet (20 mg total) by mouth 2 (two) times daily. 180 tablet 1  . gabapentin (NEURONTIN) 600 MG tablet TAKE 1/2 TABLET BY MOUTH 4 TIMES A DAY    . Gabapentin 300 MG/6ML SOLN Take 6 mLs by mouth 2 (two) times daily as needed. Take additional 2 ml at bedtime 470 mL 5  . hydrocortisone 2.5 % cream Apply 1 application topically 3 (three) times daily as needed (itching).    . montelukast (SINGULAIR) 10 MG tablet TAKE 1 TABLET BY MOUTH EVERY DAY 90 tablet 1  . Rivaroxaban (XARELTO) 15 MG TABS tablet Take 15 mg by mouth daily.     . Simethicone (GAS-X PO) Take 1 tablet by mouth daily as needed (flatulence).     . Wheat Dextrin (BENEFIBER PO) Take 1 tablet by mouth daily.    [name] amiodarone (PACERONE) 200 MG tablet Take 200 mg by mouth daily.      No facility-administered medications prior to visit.     ROS: Review of Systems  Constitutional: Negative for activity change, appetite change,  chills, fatigue and unexpected weight change.  HENT: Negative for congestion, mouth sores and sinus pressure.   Eyes: Negative for visual disturbance.  Respiratory: Negative for cough and chest tightness.   Gastrointestinal: Negative for abdominal pain and nausea.  Genitourinary: Negative for difficulty urinating, frequency and vaginal pain.  Musculoskeletal: Negative for back pain and gait problem.  Skin: Negative for pallor and rash.  Neurological: Negative for dizziness, tremors, weakness, numbness and headaches.  Psychiatric/Behavioral: Positive for dysphoric mood. Negative for confusion, sleep disturbance and suicidal ideas. The patient is nervous/anxious.     Objective:  BP 126/64 (BP Location: Left Arm, Patient Position: Sitting, Cuff Size: Normal)   Pulse 64   Temp 97.8 F (36.6 C) (Oral)   Ht 5' 2"  (1.575 m)   Wt 127 lb (57.6 kg)   SpO2 97%   BMI 23.23 kg/m   BP Readings from Last 3 Encounters:  10/18/18 126/64  09/29/18 126/70  07/18/18 136/74    Wt Readings from Last 3 Encounters:  10/18/18 127 lb (57.6 kg)  09/29/18 126 lb 0.6 oz (57.2 kg)  07/18/18 122 lb (55.3 kg)    Physical Exam Constitutional:      General: She is not in acute distress.    Appearance: She is well-developed. She is not toxic-appearing.  HENT:     Head: Normocephalic.     Right Ear: External ear normal.     Left Ear: External ear normal.     Nose: Nose normal.  Eyes:     General:        Right eye: No discharge.        Left eye: No discharge.     Conjunctiva/sclera: Conjunctivae normal.     Pupils: Pupils are equal, round, and reactive to light.  Neck:     Musculoskeletal: Normal range of motion and neck supple.     Thyroid: No thyromegaly.     Vascular: No JVD.     Trachea: No tracheal deviation.  Cardiovascular:     Rate and Rhythm: Normal rate and regular rhythm.     Heart sounds: Normal heart sounds.  Pulmonary:     Effort: No respiratory distress.     Breath sounds: No  stridor. No wheezing.  Abdominal:     General: Bowel sounds are normal. There is no distension.     Palpations: Abdomen is soft. There is no mass.     Tenderness: There is no abdominal tenderness. There is no guarding or rebound.  Musculoskeletal:        General: No tenderness.  Lymphadenopathy:     Cervical: No cervical adenopathy.  Skin:    Findings: No erythema or rash.  Neurological:     Cranial Nerves: No cranial nerve deficit.     Motor: No abnormal muscle tone.     Coordination: Coordination normal.     Deep Tendon Reflexes: Reflexes normal.  Psychiatric:        Behavior: Behavior normal.        Thought Content: Thought content normal.        Judgment: Judgment normal.     Lab Results  Component Value Date   WBC 5.3 04/27/2018   HGB 12.1 04/27/2018   HCT 39.2 04/27/2018   PLT 215 04/27/2018   GLUCOSE 104 (H) 04/27/2018   CHOL 160 02/25/2018   TRIG 119.0 02/25/2018   HDL 42.90 02/25/2018   LDLDIRECT 178.0 09/24/2011   LDLCALC 94 02/25/2018   ALT 29 04/27/2018   AST 49 (H) 04/27/2018   NA 141 04/27/2018   K 3.9 04/27/2018   CL 104 04/27/2018   CREATININE 1.02 (H) 04/27/2018   BUN 11 04/27/2018   CO2 28 04/27/2018   TSH 3.13 06/22/2017   INR 1.7 10/18/2015    Dg Abdomen Acute W/chest  Result Date: 04/27/2018 CLINICAL DATA:  Intermittent left-sided abdominal pain for 1 week. Nausea. EXAM: DG ABDOMEN ACUTE W/ 1V CHEST COMPARISON:  CT 3 days prior 04/24/2018 at [facility]. FINDINGS: The cardiomediastinal contours are normal. Moderate hiatal hernia. The lungs are clear. Surgical clips in the left axilla. There is no free intra-abdominal air. No dilated bowel loops to suggest obstruction. Small to moderate volume of stool throughout the colon. Pelvic phleboliths. No radiopaque calculi. Scoliotic curvature in the thoracolumbar spine. Vertebral augmentation at the thoracolumbar junction. No acute osseous abnormalities are seen. IMPRESSION: 1. Normal bowel gas  pattern. Small to moderate colonic stool burden. Note: Patient had abdominal CT 3 days prior at [facility] demonstrating no acute abnormality. 2. Moderate hiatal hernia. 3.  No acute pulmonary process. Electronically Signed   By: Keith Rake M.D.   On: 04/27/2018 05:40    Assessment & Plan:   There are no diagnoses linked to this encounter.   No orders of the defined types were  placed in this encounter.    Follow-up: No follow-ups on file.  Walker Kehr, MD

## 2018-10-18 NOTE — Assessment & Plan Note (Signed)
Husband died in Feb 2020 - pancreatitis Discussed

## 2018-10-18 NOTE — Assessment & Plan Note (Signed)
Vit D 

## 2018-11-30 ENCOUNTER — Ambulatory Visit (INDEPENDENT_AMBULATORY_CARE_PROVIDER_SITE_OTHER): Payer: Medicare Other | Admitting: Specialist

## 2018-12-06 ENCOUNTER — Telehealth: Payer: Self-pay | Admitting: Internal Medicine

## 2018-12-06 NOTE — Telephone Encounter (Signed)
Insurance has been submitted and verified for Prolia. Patient is responsible for a $0 copay. Due anytime. Left message for patient to call back to schedule.  Please transfer to the office to be scheduled.  ** These are currently being given on Friday and we can go out to the car if the patient prefers**

## 2018-12-07 ENCOUNTER — Telehealth: Payer: Self-pay

## 2018-12-07 ENCOUNTER — Encounter: Payer: Self-pay | Admitting: *Deleted

## 2018-12-07 NOTE — Telephone Encounter (Signed)
Copied from [place] [id number]. Topic: General - Other >> Dec 07, 2018 11:41 AM Carolyn Stare wrote:  Pt returned call about her prolia injection, she lvm on [facility] appt line,tried to reach office answering machine came on,pt said she would like to have this injection Friday mid morning and she prefer to have it done at her car. She said if she does not answer just leave her a time and she will show up  I have talked with patient and scheduled her for Friday, 4/24 at 11am to get prolia injection in parking lot.  Patient is aware that she will be billed for any copay if needed---patient is driving a silver lexus SUV

## 2018-12-08 ENCOUNTER — Other Ambulatory Visit: Payer: Self-pay

## 2018-12-08 ENCOUNTER — Encounter: Payer: Self-pay | Admitting: Internal Medicine

## 2018-12-08 ENCOUNTER — Ambulatory Visit: Payer: Medicare Other | Admitting: Podiatry

## 2018-12-08 ENCOUNTER — Ambulatory Visit (INDEPENDENT_AMBULATORY_CARE_PROVIDER_SITE_OTHER): Payer: Medicare Other | Admitting: Internal Medicine

## 2018-12-08 VITALS — Ht 62.0 in | Wt 124.0 lb

## 2018-12-08 DIAGNOSIS — K449 Diaphragmatic hernia without obstruction or gangrene: Secondary | ICD-10-CM | POA: Diagnosis not present

## 2018-12-08 DIAGNOSIS — K5730 Diverticulosis of large intestine without perforation or abscess without bleeding: Secondary | ICD-10-CM

## 2018-12-08 DIAGNOSIS — Z91018 Allergy to other foods: Secondary | ICD-10-CM

## 2018-12-08 DIAGNOSIS — K219 Gastro-esophageal reflux disease without esophagitis: Secondary | ICD-10-CM

## 2018-12-08 DIAGNOSIS — K224 Dyskinesia of esophagus: Secondary | ICD-10-CM

## 2018-12-08 NOTE — Progress Notes (Signed)
   Subjective:    Patient ID: Alyssa Luna, female    DOB: 06-Sep-1933, 85 y.o.   MRN: [id number]  This service was provided via telemedicine.  Telephone call, audio only The patient was located at home  the provider was located in [place] office  the patient did consent to this telephone visit and is aware of possible charges through their insurance for this visit.   The persons participating in this telemedicine service were patient and I Time spent on call:  14 min   HPI Alyssa Luna is an 85 year old female with a history of GERD, hiatal hernia, esophageal dysmotility, severe colonic diverticulosis, alpha gal disorder, A. fib on Xarelto, hypertension and scoliosis here for follow-up.  Seen virtually in the setting of COVID-19.  Last seen in the office on 06/15/2018.  At the time of her last visit her GERD symptoms were felt to be well controlled and she continued famotidine 20 mg twice daily.  She was getting over a bout of ischemic colitis and was doing well on Benefiber.  Today feels "about the same".  Usual sore throat and hoarseness.  Citric acid causes a problem (OJ, dill pickle).  Sinus drainage on Zyrtec.  Using the famotidine 20 mg BID.  Not much heartburn, but having "GERD".  GERD for her is regurgitation during and after eating.  BMs are "fine".  Using Benefiber and having morning BMs.  No diarrhea or lower abd pain.  No bleeding.     Review of Systems As per HPI, otherwise negative  Current Medications, Allergies, Past Medical History, Past Surgical History, Family History and Social History were reviewed in [employer] record.     Objective:   Physical Exam No examination, virtual visit     Assessment & Plan:   85 year old female with a history of GERD, hiatal hernia, esophageal dysmotility, severe colonic diverticulosis, alpha gal disorder, A. fib on Xarelto, hypertension and scoliosis here for follow-up.  1.  GERD with hiatal  hernia/LPR symptoms --GERD is likely not completely controlled contributing to LPR symptoms but also regurgitation and exacerbating esophageal dysmotility.  Hiatal hernia also involved in her symptoms.  We will try PPI for more potent acid reduction --Omeprazole 40 mg 30 minutes before first meal; change famotidine to 20 mg in the evening --GERD diet  2.  History of severe diverticulosis and ischemic colitis --doing well recently on Benefiber without recurrent diarrhea.  Continue Benefiber daily  3.  Alpha gal allergy --she will continue avoiding beef and pork and dairy products.  Repeat telephone visit in 6 weeks for checkup  15 minutes today. Greater than 50% was spent in counseling and coordination of care with the patient

## 2018-12-09 ENCOUNTER — Ambulatory Visit (INDEPENDENT_AMBULATORY_CARE_PROVIDER_SITE_OTHER): Payer: Medicare Other

## 2018-12-09 ENCOUNTER — Ambulatory Visit: Payer: Medicare Other | Admitting: Podiatry

## 2018-12-09 DIAGNOSIS — M810 Age-related osteoporosis without current pathological fracture: Secondary | ICD-10-CM | POA: Diagnosis not present

## 2018-12-09 DIAGNOSIS — E538 Deficiency of other specified B group vitamins: Secondary | ICD-10-CM | POA: Diagnosis not present

## 2018-12-09 MED ORDER — DENOSUMAB 60 MG/ML ~~LOC~~ SOSY
60.0000 mg | PREFILLED_SYRINGE | Freq: Once | SUBCUTANEOUS | Status: AC
  Administered 2018-12-09: 60 mg via SUBCUTANEOUS

## 2018-12-09 MED ORDER — CYANOCOBALAMIN 1000 MCG/ML IJ SOLN
1000.0000 ug | Freq: Once | INTRAMUSCULAR | Status: AC
  Administered 2018-12-09: 1000 ug via INTRAMUSCULAR

## 2018-12-09 NOTE — Progress Notes (Signed)
b12 Injection given.   Imogine Carvell J Dareon Nunziato, MD  

## 2018-12-12 ENCOUNTER — Other Ambulatory Visit: Payer: Self-pay

## 2018-12-12 ENCOUNTER — Ambulatory Visit: Payer: Medicare Other | Admitting: Podiatry

## 2018-12-12 VITALS — Temp 97.9°F

## 2018-12-12 DIAGNOSIS — L84 Corns and callosities: Secondary | ICD-10-CM | POA: Diagnosis not present

## 2018-12-12 DIAGNOSIS — B351 Tinea unguium: Secondary | ICD-10-CM | POA: Diagnosis not present

## 2018-12-12 DIAGNOSIS — Z9229 Personal history of other drug therapy: Secondary | ICD-10-CM | POA: Diagnosis not present

## 2018-12-12 DIAGNOSIS — M79676 Pain in unspecified toe(s): Secondary | ICD-10-CM | POA: Diagnosis not present

## 2018-12-12 DIAGNOSIS — M79609 Pain in unspecified limb: Principal | ICD-10-CM

## 2018-12-12 NOTE — Patient Instructions (Signed)

## 2018-12-13 ENCOUNTER — Ambulatory Visit: Payer: Medicare Other | Admitting: Podiatry

## 2018-12-16 ENCOUNTER — Encounter: Payer: Self-pay | Admitting: Podiatry

## 2018-12-16 NOTE — Progress Notes (Signed)
Subjective: Alyssa Luna is a 85 y.o. y.o. female who is on long term blood thinner Xarelto and presents today with painful, mycotic toenails and corn right 5th digit which both interfere with daily activities. Pain is aggravated when wearing enclosed shoe gear. Pain is relieved with periodic professional debridement.  Pt also presents with painful corn right 5th digit.  Plotnikov, Evie Lacks, MD is her PCP.    Current Outpatient Medications:  .  amiodarone (PACERONE) 200 MG tablet, Take 200 mg by mouth daily., Disp: , Rfl:  .  amLODipine (NORVASC) 5 MG tablet, TAKE 1 TABLET BY MOUTH EVERY DAY, Disp: , Rfl:  .  cetirizine (ZYRTEC) 10 MG tablet, Take 10 mg by mouth daily as needed for allergies., Disp: , Rfl:  .  Cholecalciferol (D3 DOTS) 2000 units TBDP, Take by mouth daily., Disp: , Rfl:  .  Cyanocobalamin (VITAMIN B-12 IJ), Inject as directed every 30 (thirty) days., Disp: , Rfl:  .  denosumab (PROLIA) 60 MG/ML SOLN injection, Inject 60 mg into the skin every 6 (six) months. Administer in upper arm, thigh, or abdomen Unable to tolerate oral meds Dx severe osteoporosis, Disp: 1.8 mL, Rfl: 6 .  diphenhydrAMINE HCl (BENADRYL ALLERGY CHILDRENS PO), Take by mouth at bedtime as needed. Takes at night when itching, Disp: , Rfl:  .  famotidine (PEPCID) 20 MG tablet, Take 1 tablet (20 mg total) by mouth 2 (two) times daily., Disp: 180 tablet, Rfl: 1 .  gabapentin (NEURONTIN) 600 MG tablet, TAKE 1/2 TABLET BY MOUTH 4 TIMES A DAY, Disp: , Rfl:  .  hydrocortisone 2.5 % cream, Apply 1 application topically 3 (three) times daily as needed (itching)., Disp: , Rfl:  .  montelukast (SINGULAIR) 10 MG tablet, TAKE 1 TABLET BY MOUTH EVERY DAY, Disp: 90 tablet, Rfl: 1 .  Rivaroxaban (XARELTO) 15 MG TABS tablet, Take 15 mg by mouth daily. , Disp: , Rfl:  .  Simethicone (GAS-X PO), Take 1 tablet by mouth daily as needed (flatulence). , Disp: , Rfl:  .  Wheat Dextrin (BENEFIBER PO), Take 1 tablet by mouth  daily., Disp: , Rfl:    Allergies  Allergen Reactions  . Beef Extract Other (See Comments)    Alpha-gal allergy testing positive June 2018  . Galactose Nausea Only    Per patient, cannot have alpha-gal since tic bite episode.  . Pravastatin Anaphylaxis and Other (See Comments)    Legs ache  . Acetaminophen Other (See Comments)  . Colchicine Other (See Comments)  . Doxycycline Other (See Comments)    Nausea - able to take w/food  . Shellfish Allergy Other (See Comments)    Unknown  . Tikosyn [Dofetilide] Other (See Comments)    Unknown reaction and was told never to take it due to problems during sleep   . Actonel [Risedronate Sodium] Other (See Comments)    Not known  . Alendronate Other (See Comments)    NOT KNOWN  . Augmentin [Amoxicillin-Pot Clavulanate] Rash    Has patient had a PCN reaction causing immediate rash, facial/tongue/throat swelling, SOB or lightheadedness with hypotension: Yes Has patient had a PCN reaction causing severe rash involving mucus membranes or skin necrosis:No Has patient had a PCN reaction that required hospitalization: No Has patient had a PCN reaction occurring within the last 10 years: No If all of the above answers are "NO", then may proceed with Cephalosporin use.   . Ciprofloxacin Nausea Only  . Evista [Raloxifene] Other (See Comments)    Unknown  reaction  . Flagyl [Metronidazole] Other (See Comments)    Not known  . Fosamax [Alendronate Sodium] Other (See Comments)    NOT KNOWN  . Gold-Containing Drug Products Other (See Comments)    Per Dr  . Loma Sousa [Escitalopram Oxalate] Other (See Comments)    shaky  . Risedronate Other (See Comments)    Unknown reaction  . Simvastatin Other (See Comments)    REACTION: leg cramps, weakness  . Tramadol Other (See Comments)    Mental disturbance changes     Objective: Vascular Examination: Capillary refill time immediate x 10 digits.   Dorsalis pedis pulses palpable b/l.  Posterior tibial  pulses palpable b/l.  Digital hair present x 10 digits.  Skin temperature gradient WNL.  Dermatological Examination: Skin with normal turgor, texture and tone b/l.  Toenails 1-5 b/l discolored, thick, dystrophic with subungual debris and pain with palpation to nailbeds due to thickness of nails.  Hyperkeratotic lesion dorsal aspect 5th PIPJ right foot.   No erythema, no edema, no drainage, no flocculence noted.   Musculoskeletal: Muscle strength 5/5 to all LE muscle groups.  Hammertoe right 5th digit.  Neurological: Sensation intact with 10 gram monofilament.  Vibratory sensation intact.  Assessment: Painful onychomycosis toenails 1-5 b/l in patient on blood thinner.  Corn right 5th digit  Plan: 1. Toenails 1-5 b/l were debrided in length and girth without iatrogenic bleeding. 2. Hyperkeratotic lesion(s) right 5th digit debrided utilizing sterile scalpel blade without incident. 3. Patient to continue soft, supportive shoe gear daily. 4. Patient to report any pedal injuries to medical professional immediately. 5. Avoid self trimming due to use of blood thinner. 6. Follow up 3 months. 7. Patient/POA to call should there be a concern in the interim.

## 2018-12-20 ENCOUNTER — Encounter: Payer: Self-pay | Admitting: Internal Medicine

## 2018-12-20 ENCOUNTER — Other Ambulatory Visit (INDEPENDENT_AMBULATORY_CARE_PROVIDER_SITE_OTHER): Payer: Medicare Other

## 2018-12-20 ENCOUNTER — Other Ambulatory Visit: Payer: Medicare Other

## 2018-12-20 ENCOUNTER — Ambulatory Visit (INDEPENDENT_AMBULATORY_CARE_PROVIDER_SITE_OTHER): Payer: Medicare Other | Admitting: Internal Medicine

## 2018-12-20 DIAGNOSIS — E876 Hypokalemia: Secondary | ICD-10-CM

## 2018-12-20 DIAGNOSIS — R600 Localized edema: Secondary | ICD-10-CM

## 2018-12-20 DIAGNOSIS — I4891 Unspecified atrial fibrillation: Secondary | ICD-10-CM

## 2018-12-20 DIAGNOSIS — E538 Deficiency of other specified B group vitamins: Secondary | ICD-10-CM

## 2018-12-20 LAB — BASIC METABOLIC PANEL
BUN: 14 mg/dL (ref 6–23)
CO2: 26 mEq/L (ref 19–32)
Calcium: 8.8 mg/dL (ref 8.4–10.5)
Chloride: 107 mEq/L (ref 96–112)
Creatinine, Ser: 0.97 mg/dL (ref 0.40–1.20)
GFR: 54.81 mL/min — ABNORMAL LOW (ref >60.00)
Glucose, Bld: 98 mg/dL (ref 70–99)
Potassium: 4.5 mEq/L (ref 3.5–5.1)
Sodium: 140 mEq/L (ref 135–145)

## 2018-12-20 LAB — HEPATIC FUNCTION PANEL
ALT: 56 U/L — ABNORMAL HIGH (ref 0–35)
AST: 82 U/L — ABNORMAL HIGH (ref 0–37)
Albumin: 3.9 g/dL (ref 3.5–5.2)
Alkaline Phosphatase: 69 U/L (ref 39–117)
Bilirubin, Direct: 0.1 mg/dL (ref 0.0–0.3)
Total Bilirubin: 0.6 mg/dL (ref 0.2–1.2)
Total Protein: 6.8 g/dL (ref 6.0–8.3)

## 2018-12-20 LAB — VITAMIN B12: Vitamin B-12: 1096 pg/mL — ABNORMAL HIGH (ref 211–911)

## 2018-12-20 LAB — TSH: TSH: 11.91 u[IU]/mL — ABNORMAL HIGH (ref 0.35–4.50)

## 2018-12-20 MED ORDER — POTASSIUM CHLORIDE ER 10 MEQ PO TBCR: 10.0000 meq | EXTENDED_RELEASE_TABLET | Freq: Every day | ORAL | 3 refills | Status: DC

## 2018-12-20 MED ORDER — FUROSEMIDE 20 MG PO TABS: 20.0000 mg | ORAL_TABLET | Freq: Every day | ORAL | 3 refills | Status: DC | PRN

## 2018-12-20 NOTE — Assessment & Plan Note (Signed)
Continue with B12

## 2018-12-20 NOTE — Assessment & Plan Note (Signed)
Likely the swelling is related to chronic venous insufficiency, possibly aggravated by inactivity Obtain lab work including d-dimer Start Lasix/K. Dur Wear compression socks during the day

## 2018-12-20 NOTE — Progress Notes (Signed)
Virtual Visit via Video Note  I connected with Alyssa Luna on 12/20/18 at 10:00 AM EDT by a video enabled telemedicine application and verified that I am speaking with the correct person using two identifiers.   I discussed the limitations of evaluation and management by telemedicine and the availability of in person appointments. The patient expressed understanding and agreed to proceed.  History of Present Illness: The patient is complaining of severe leg swelling left more than the right all of 7-10 days of duration.  She stopped amlodipine and a week ago-no change in swelling.  Legs feel tight.  She continues to take Xarelto.  There is no chest pain or shortness of breath.  No orthopnea.  There has been no runny nose, cough, chest pain, shortness of breath, abdominal pain, diarrhea, constipation. Old arthralgias, no skin rashes.   Observations/Objective: The patient appears to be in no acute distress, looks well.  Assessment and Plan:  See my Assessment and Plan. Follow Up Instructions:    I discussed the assessment and treatment plan with the patient. The patient was provided an opportunity to ask questions and all were answered. The patient agreed with the plan and demonstrated an understanding of the instructions.   The patient was advised to call back or seek an in-person evaluation if the symptoms worsen or if the condition fails to improve as anticipated.  I provided face-to-face time during this encounter. We were at different locations.   Walker Kehr, MD

## 2018-12-20 NOTE — Assessment & Plan Note (Signed)
Chronic.  On Xarelto.  On Pacerone

## 2018-12-20 NOTE — Assessment & Plan Note (Signed)
Will treat with potassium prophylactically while taking furosemide

## 2018-12-21 ENCOUNTER — Other Ambulatory Visit: Payer: Self-pay | Admitting: Internal Medicine

## 2018-12-21 DIAGNOSIS — R600 Localized edema: Secondary | ICD-10-CM

## 2018-12-21 LAB — D-DIMER, QUANTITATIVE: D-Dimer, Quant: 1.9 mcg/mL FEU — ABNORMAL HIGH (ref <0.50)

## 2018-12-21 MED ORDER — LEVOTHYROXINE SODIUM 25 MCG PO TABS: 25.0000 ug | ORAL_TABLET | Freq: Every day | ORAL | 5 refills | Status: DC

## 2018-12-22 ENCOUNTER — Telehealth: Payer: Self-pay | Admitting: Internal Medicine

## 2018-12-22 NOTE — Telephone Encounter (Signed)
Noted! Thank you

## 2018-12-22 NOTE — Telephone Encounter (Signed)
Copied from [place] [phone]. Topic: General - Other >> Dec 22, 2018 10:41 AM Lennox Solders wrote: Reason for CRM: Crystal with  cardio [facility] imaging on [address] is calling to let dr plotnikov know the patient does not want doppler to rule out dvt done yet . Pt has an appt with [facility]ologist tomorrow and she will see if they can do the doppler at their office if they can not she will call [facility] imaging on [address] to get the doppler  schedule

## 2018-12-22 NOTE — Telephone Encounter (Signed)
FYI

## 2018-12-29 NOTE — Telephone Encounter (Signed)
Pt notified that she should contact [facility] since they were the ones who put her on the medication. If they are unwilling to do anything I informed pt she can call back and we can schedule her to see Dr. Alain Marion early next week

## 2018-12-29 NOTE — Telephone Encounter (Signed)
Pt called and stated that she went to [facility] and they confirmed that she did not have a blood clot and put the patient on antibiotic. Pt states that her leg is still swollen and the medications does not seem to be working. Pt states that she is taking fulfamethoxaz-ole-temp ds. Pt states that she has been on the medication for 6 days. Pt would like a call back regarding. Please advise

## 2019-01-16 ENCOUNTER — Ambulatory Visit: Payer: Medicare Other | Admitting: Specialist

## 2019-01-25 ENCOUNTER — Telehealth: Payer: Self-pay

## 2019-01-25 NOTE — Telephone Encounter (Signed)
Patient called and left a VM stating that she is having a lot of back pain and would like a Rx for the pain.  Cb# is [phone].  Please advise.  Thank you.

## 2019-01-25 NOTE — Telephone Encounter (Signed)
Patient called and left a VM stating that she is having a lot of back pain and would like a Rx for the pain.  Cb# is [phone].  Please advise.  Thank you.--------Please advise, she has not been seen since 03/2018

## 2019-01-26 ENCOUNTER — Ambulatory Visit (INDEPENDENT_AMBULATORY_CARE_PROVIDER_SITE_OTHER): Payer: Medicare Other | Admitting: Specialist

## 2019-01-26 ENCOUNTER — Other Ambulatory Visit: Payer: Self-pay

## 2019-01-26 ENCOUNTER — Encounter: Payer: Self-pay | Admitting: Specialist

## 2019-01-26 ENCOUNTER — Ambulatory Visit: Payer: Self-pay

## 2019-01-26 VITALS — BP 134/64 | HR 55 | Ht 60.0 in | Wt 129.0 lb

## 2019-01-26 DIAGNOSIS — S32040A Wedge compression fracture of fourth lumbar vertebra, initial encounter for closed fracture: Secondary | ICD-10-CM

## 2019-01-26 DIAGNOSIS — M8000XA Age-related osteoporosis with current pathological fracture, unspecified site, initial encounter for fracture: Secondary | ICD-10-CM | POA: Diagnosis not present

## 2019-01-26 DIAGNOSIS — G8911 Acute pain due to trauma: Secondary | ICD-10-CM

## 2019-01-26 DIAGNOSIS — M48061 Spinal stenosis, lumbar region without neurogenic claudication: Secondary | ICD-10-CM | POA: Diagnosis not present
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
## 2019-01-26 NOTE — Addendum Note (Signed)
Addended by: Minda Ditto, Geoffery Spruce on: 01/26/2019 09:42 AM   Modules accepted: Orders

## 2019-01-26 NOTE — Patient Instructions (Addendum)
Avoid frequent bending and stooping  No lifting greater than 10 lbs. May use ice or moist heat for pain. Weight loss is of benefit. Best medication for lumbar disc disease is arthritis medications like Tylenol ES.  Exercise is important to improve your indurance and does allow people to function better inspite of back pain. Tramadol for pain, 1 every 4-6 hours.  Order MRI and assessment for kyphoplasty L1 for an new compression fracture below previous T12 fracture at risk of worsening kyphosis due to sever osteoporosis.

## 2019-01-27 ENCOUNTER — Telehealth: Payer: Self-pay | Admitting: Specialist

## 2019-01-27 NOTE — Telephone Encounter (Signed)
Patient called to state her pain is getting worst and nothing is helping. She either wants surgery moved up or call in Rx of Oxycodone.

## 2019-01-27 NOTE — Telephone Encounter (Signed)
Patient called to state her pain is getting worst and nothing is helping. She either wants surgery moved up or call in Rx of Oxycodone.  Please call patient  @ [phone]

## 2019-01-30 ENCOUNTER — Other Ambulatory Visit: Payer: Self-pay

## 2019-01-30 ENCOUNTER — Telehealth: Payer: Self-pay

## 2019-01-30 ENCOUNTER — Ambulatory Visit
Admission: RE | Admit: 2019-01-30 | Discharge: 2019-01-30 | Disposition: A | Discharge status: Home / Self Care | Payer: Medicare Other | Source: Ambulatory Visit | Attending: Specialist | Admitting: Specialist

## 2019-01-30 DIAGNOSIS — G8911 Acute pain due to trauma: Secondary | ICD-10-CM

## 2019-01-30 NOTE — Telephone Encounter (Signed)
Patient called said that Dr Louanne Skye has been working with her about getting treatment for her compression fractures. She said he told her she would need to have an MRI done before kyphoplasty could be done. She is asking if this can be expedited in anyway because she is hurting so bad. I did let her know that he is out of the office this week.  She asked for you to please call her to further discuss. [phone]

## 2019-01-30 NOTE — Telephone Encounter (Signed)
I called and advised that per notes on the refill her insurance does not require prior auth for the MRI---I gave her the number to [facility] to call and schedule her MRI, she then asked about getting an appt with Dr. Arlean Hopping, and I advised that they have the referral and I was not sure of how their sched is working with the [facility] and that I was sure they would get her as soon as they could however they need the MRI done first.

## 2019-01-31 ENCOUNTER — Telehealth: Payer: Self-pay

## 2019-01-31 NOTE — Telephone Encounter (Signed)
Patient called stating she wants someone to call her as soon as we get her MRI results, she states she is supposed to get kyphoplasty done after MRI and she is really anxious to get it done because she is in so much pain!

## 2019-02-01 ENCOUNTER — Telehealth: Payer: Self-pay

## 2019-02-01 NOTE — Telephone Encounter (Signed)
Patient left me a voicemail stating she is wanting to get set up with Dr. Estanislado Pandy asap for kyphoplasty

## 2019-02-02 NOTE — Telephone Encounter (Signed)
I called and sched her for MRI review on 6/23 @1 :30pm

## 2019-02-06 ENCOUNTER — Other Ambulatory Visit (HOSPITAL_COMMUNITY): Payer: Self-pay | Admitting: Interventional Radiology

## 2019-02-06 ENCOUNTER — Telehealth: Payer: Self-pay | Admitting: Radiology

## 2019-02-06 ENCOUNTER — Other Ambulatory Visit: Payer: Self-pay | Admitting: Student

## 2019-02-06 DIAGNOSIS — S22070A Wedge compression fracture of T9-T10 vertebra, initial encounter for closed fracture: Secondary | ICD-10-CM

## 2019-02-06 DIAGNOSIS — S32020A Wedge compression fracture of second lumbar vertebra, initial encounter for closed fracture: Secondary | ICD-10-CM

## 2019-02-06 NOTE — Telephone Encounter (Signed)
Gildardo Pounds with Dr. Arlean Hopping office called and would like to know if Dr. Louanne Skye wants a biopsy done at the time of her kyphoplasty at 0830 in the morning. Please call Caryl Pina back to advise.  [phone].

## 2019-02-06 NOTE — Telephone Encounter (Signed)
Waiting for Dr. Louanne Skye

## 2019-02-07 ENCOUNTER — Other Ambulatory Visit (HOSPITAL_COMMUNITY): Payer: Self-pay | Admitting: Interventional Radiology

## 2019-02-07 ENCOUNTER — Ambulatory Visit: Payer: Medicare Other | Admitting: Specialist

## 2019-02-07 ENCOUNTER — Ambulatory Visit (HOSPITAL_COMMUNITY)
Admission: RE | Admit: 2019-02-07 | Discharge: 2019-02-07 | Disposition: A | Discharge status: Home / Self Care | Payer: Medicare Other | Source: Ambulatory Visit | Attending: Interventional Radiology | Admitting: Interventional Radiology

## 2019-02-07 ENCOUNTER — Encounter (HOSPITAL_COMMUNITY): Payer: Self-pay

## 2019-02-07 ENCOUNTER — Other Ambulatory Visit: Payer: Self-pay

## 2019-02-07 DIAGNOSIS — M419 Scoliosis, unspecified: Secondary | ICD-10-CM | POA: Diagnosis not present

## 2019-02-07 DIAGNOSIS — E785 Hyperlipidemia, unspecified: Secondary | ICD-10-CM | POA: Insufficient documentation

## 2019-02-07 DIAGNOSIS — M8580 Other specified disorders of bone density and structure, unspecified site: Secondary | ICD-10-CM | POA: Diagnosis not present

## 2019-02-07 DIAGNOSIS — S22070A Wedge compression fracture of T9-T10 vertebra, initial encounter for closed fracture: Secondary | ICD-10-CM | POA: Diagnosis not present

## 2019-02-07 DIAGNOSIS — S32020A Wedge compression fracture of second lumbar vertebra, initial encounter for closed fracture: Secondary | ICD-10-CM | POA: Insufficient documentation

## 2019-02-07 DIAGNOSIS — I10 Essential (primary) hypertension: Secondary | ICD-10-CM | POA: Insufficient documentation

## 2019-02-07 DIAGNOSIS — G709 Myoneural disorder, unspecified: Secondary | ICD-10-CM | POA: Insufficient documentation

## 2019-02-07 DIAGNOSIS — Z7901 Long term (current) use of anticoagulants: Secondary | ICD-10-CM | POA: Diagnosis not present

## 2019-02-07 DIAGNOSIS — M810 Age-related osteoporosis without current pathological fracture: Secondary | ICD-10-CM | POA: Insufficient documentation

## 2019-02-07 DIAGNOSIS — Z8673 Personal history of transient ischemic attack (TIA), and cerebral infarction without residual deficits: Secondary | ICD-10-CM | POA: Insufficient documentation

## 2019-02-07 DIAGNOSIS — Z90722 Acquired absence of ovaries, bilateral: Secondary | ICD-10-CM | POA: Diagnosis not present

## 2019-02-07 DIAGNOSIS — I4891 Unspecified atrial fibrillation: Secondary | ICD-10-CM | POA: Diagnosis not present

## 2019-02-07 DIAGNOSIS — X58XXXA Exposure to other specified factors, initial encounter: Secondary | ICD-10-CM | POA: Insufficient documentation

## 2019-02-07 DIAGNOSIS — Z79899 Other long term (current) drug therapy: Secondary | ICD-10-CM | POA: Diagnosis not present

## 2019-02-07 DIAGNOSIS — I2510 Atherosclerotic heart disease of native coronary artery without angina pectoris: Secondary | ICD-10-CM | POA: Diagnosis not present

## 2019-02-07 DIAGNOSIS — Z9013 Acquired absence of bilateral breasts and nipples: Secondary | ICD-10-CM | POA: Insufficient documentation

## 2019-02-07 DIAGNOSIS — Z79890 Hormone replacement therapy (postmenopausal): Secondary | ICD-10-CM | POA: Diagnosis not present

## 2019-02-07 DIAGNOSIS — Z90710 Acquired absence of both cervix and uterus: Secondary | ICD-10-CM | POA: Insufficient documentation

## 2019-02-07 DIAGNOSIS — K219 Gastro-esophageal reflux disease without esophagitis: Secondary | ICD-10-CM | POA: Insufficient documentation

## 2019-02-07 DIAGNOSIS — Z96653 Presence of artificial knee joint, bilateral: Secondary | ICD-10-CM | POA: Insufficient documentation

## 2019-02-07 HISTORY — PX: IR KYPHO EA ADDL LEVEL THORACIC OR LUMBAR: IMG5520

## 2019-02-07 HISTORY — PX: IR KYPHO LUMBAR INC FX REDUCE BONE BX UNI/BIL CANNULATION INC/IMAGING: IMG5519

## 2019-02-07 LAB — URINALYSIS, COMPLETE (UACMP) WITH MICROSCOPIC
Bacteria, UA: NONE SEEN
Bilirubin Urine: NEGATIVE
Glucose, UA: NEGATIVE mg/dL
Hgb urine dipstick: NEGATIVE
Ketones, ur: NEGATIVE mg/dL
Leukocytes,Ua: NEGATIVE
Nitrite: NEGATIVE
Protein, ur: NEGATIVE mg/dL
Specific Gravity, Urine: 1.017 (ref 1.005–1.030)
pH: 6 (ref 5.0–8.0)

## 2019-02-07 LAB — CBC WITH DIFFERENTIAL/PLATELET
Abs Immature Granulocytes: 0.01 10*3/uL (ref 0.00–0.07)
Basophils Absolute: 0.1 10*3/uL (ref 0.0–0.1)
Basophils Relative: 2 %
Eosinophils Absolute: 0.2 10*3/uL (ref 0.0–0.5)
Eosinophils Relative: 4 %
HCT: 37.5 % (ref 36.0–46.0)
Hemoglobin: 11.5 g/dL — ABNORMAL LOW (ref 12.0–15.0)
Immature Granulocytes: 0 %
Lymphocytes Relative: 40 %
Lymphs Abs: 1.6 10*3/uL (ref 0.7–4.0)
MCH: 26.5 pg (ref 26.0–34.0)
MCHC: 30.7 g/dL (ref 30.0–36.0)
MCV: 86.4 fL (ref 80.0–100.0)
Monocytes Absolute: 0.5 10*3/uL (ref 0.1–1.0)
Monocytes Relative: 13 %
Neutro Abs: 1.7 10*3/uL (ref 1.7–7.7)
Neutrophils Relative %: 41 %
Platelets: 380 10*3/uL (ref 150–400)
RBC: 4.34 MIL/uL (ref 3.87–5.11)
RDW: 15.5 % (ref 11.5–15.5)
WBC: 4 10*3/uL (ref 4.0–10.5)
nRBC: 0 % (ref 0.0–0.2)

## 2019-02-07 LAB — PROTIME-INR
INR: 1.3 — ABNORMAL HIGH (ref 0.8–1.2)
Prothrombin Time: 15.7 seconds — ABNORMAL HIGH (ref 11.4–15.2)

## 2019-02-07 LAB — APTT: aPTT: 31 seconds (ref 24–36)

## 2019-02-07 MED ORDER — TOBRAMYCIN SULFATE 1.2 G IJ SOLR
INTRAMUSCULAR | Status: AC
  Filled 2019-02-07: qty 1.2

## 2019-02-07 MED ORDER — MIDAZOLAM HCL 2 MG/2ML IJ SOLN
INTRAMUSCULAR | Status: AC | PRN
  Administered 2019-02-07 (×2): 1 mg via INTRAVENOUS
  Administered 2019-02-07: 0.5 mg via INTRAVENOUS

## 2019-02-07 MED ORDER — FENTANYL CITRATE (PF) 100 MCG/2ML IJ SOLN
INTRAMUSCULAR | Status: AC | PRN
  Administered 2019-02-07 (×2): 12.5 ug via INTRAVENOUS
  Administered 2019-02-07 (×2): 25 ug via INTRAVENOUS

## 2019-02-07 MED ORDER — BUPIVACAINE HCL (PF) 0.5 % IJ SOLN
INTRAMUSCULAR | Status: AC | PRN
  Administered 2019-02-07: 50 mL

## 2019-02-07 MED ORDER — IOHEXOL 300 MG/ML  SOLN
50.0000 mL | Freq: Once | INTRAMUSCULAR | Status: AC | PRN
  Administered 2019-02-07: 30 mL

## 2019-02-07 MED ORDER — MIDAZOLAM HCL 2 MG/2ML IJ SOLN
INTRAMUSCULAR | Status: AC
  Filled 2019-02-07: qty 2

## 2019-02-07 MED ORDER — CEFAZOLIN SODIUM-DEXTROSE 2-4 GM/100ML-% IV SOLN
INTRAVENOUS | Status: AC
  Filled 2019-02-07: qty 100

## 2019-02-07 MED ORDER — FENTANYL CITRATE (PF) 100 MCG/2ML IJ SOLN
INTRAMUSCULAR | Status: AC
  Filled 2019-02-07: qty 2

## 2019-02-07 MED ORDER — SODIUM CHLORIDE 0.9 % IV SOLN
INTRAVENOUS | Status: AC
  Administered 2019-02-07: 12:00:00 via INTRAVENOUS

## 2019-02-07 MED ORDER — BUPIVACAINE HCL (PF) 0.5 % IJ SOLN
INTRAMUSCULAR | Status: AC
  Filled 2019-02-07: qty 30

## 2019-02-07 MED ORDER — SODIUM CHLORIDE 0.9 % IV SOLN
INTRAVENOUS | Status: AC | PRN
  Administered 2019-02-07: 10 mL/h via INTRAVENOUS

## 2019-02-07 MED ORDER — CEFAZOLIN SODIUM-DEXTROSE 2-4 GM/100ML-% IV SOLN
INTRAVENOUS | Status: AC | PRN
  Administered 2019-02-07: 2 g via INTRAVENOUS

## 2019-02-07 MED ORDER — TOBRAMYCIN SULFATE 1.2 G IJ SOLR
INTRAMUSCULAR | Status: AC | PRN
  Administered 2019-02-07: .01 g via TOPICAL

## 2019-02-07 NOTE — Telephone Encounter (Signed)
They were given Dr. Otho Ket cell phone number to call him.

## 2019-02-07 NOTE — Discharge Instructions (Signed)
No stooping,bending or lifting more than 10 lbs for 2 weeks. Use walker to ambulate for  2 weeks No driving for 2 weeks. 4.RTC PRN 2 to 3 weeks   Balloon Kyphoplasty, Care After Refer to this sheet in the next few weeks. These instructions provide you with information about caring for yourself after your procedure. Your health care provider may also give you more specific instructions. Your treatment has been planned according to current medical practices, but problems sometimes occur. Call your health care provider if you have any problems or questions after your procedure. What can I expect after the procedure? After your procedure, it is common to have back pain. Follow these instructions at home: Incision care  Follow instructions from your health care provider about how to take care of your incisions. Make sure you: ? Wash your hands with soap and water before you change your bandage (dressing). If soap and water are not available, use hand sanitizer. ? Change your dressing as told by your health care provider. ? Leave stitches (sutures), skin glue, or adhesive strips in place. These skin closures may need to be in place for 2 weeks or longer. If adhesive strip edges start to loosen and curl up, you may trim the loose edges. Do not remove adhesive strips completely unless your health care provider tells you to do that.  Check your incision area every day for signs of infection. Watch for: ? Redness, swelling, or pain. ? Fluid, blood, or pus.  Keep your dressing dry until your health care provider says that it can be removed. Activity   Rest your back and avoid intense physical activity for as long as told by your health care provider.  Return to your normal activities as told by your health care provider. Ask your health care provider what activities are safe for you.  Do not lift anything that is heavier than 10 lb (4.5 kg). This is about the weight of a gallon of milk.You may  need to avoid heavy lifting for several weeks. General instructions  Take over-the-counter and prescription medicines only as told by your health care provider.  If directed, apply ice to the painful area: ? Put ice in a plastic bag. ? Place a towel between your skin and the bag. ? Leave the ice on for 20 minutes, 2-3 times per day.  Do not use tobacco products, including cigarettes, chewing tobacco, or e-cigarettes. If you need help quitting, ask your health care provider.  Keep all follow-up visits as told by your health care provider. This is important. Contact a health care provider if:  You have a fever.  You have redness, swelling, or pain at the site of your incisions.  You have fluid, blood, or pus coming from your incisions.  You have pain that gets worse or does not get better with medicine.  You develop numbness or weakness in any part of your body. Get help right away if:  You have chest pain.  You have difficulty breathing.  You cannot move your legs.  You cannot control your bladder or bowel movements.  You suddenly become weak or numb on one side of your body.  You become very confused.  You have trouble speaking or understanding, or both. This information is not intended to replace advice given to you by your health care provider. Make sure you discuss any questions you have with your health care provider. Document Released: 04/24/2015 Document Revised: 01/09/2016 Document Reviewed: 11/26/2014 Elsevier Interactive Patient  Education  2019 [employer].

## 2019-02-07 NOTE — Procedures (Signed)
S/P T 10 and L2 balloon KP
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
## 2019-02-08 ENCOUNTER — Encounter (HOSPITAL_COMMUNITY): Payer: Self-pay | Admitting: Interventional Radiology

## 2019-02-09 ENCOUNTER — Ambulatory Visit: Payer: Medicare Other | Admitting: Specialist

## 2019-02-09 NOTE — Telephone Encounter (Signed)
She needs a follow up appoint to see how she responded to kyphoplasty if no improvement then we may need to consider a laminectomy. Alyssa Luna

## 2019-02-10 ENCOUNTER — Other Ambulatory Visit (INDEPENDENT_AMBULATORY_CARE_PROVIDER_SITE_OTHER): Payer: Self-pay | Admitting: Specialist

## 2019-02-10 MED ORDER — TRAMADOL HCL 50 MG PO TABS: 50.0000 mg | ORAL_TABLET | Freq: Four times a day (QID) | ORAL | 0 refills | Status: DC | PRN

## 2019-02-13 NOTE — Telephone Encounter (Signed)
Dr. Louanne Skye spoke with patient

## 2019-02-15 HISTORY — PX: OTHER SURGICAL HISTORY: SHX169

## 2019-02-16 DIAGNOSIS — D320 Benign neoplasm of cerebral meninges: Secondary | ICD-10-CM | POA: Insufficient documentation

## 2019-02-20 ENCOUNTER — Encounter: Payer: Self-pay | Admitting: Podiatry

## 2019-02-20 ENCOUNTER — Ambulatory Visit: Payer: Medicare Other | Admitting: Podiatry

## 2019-02-20 ENCOUNTER — Other Ambulatory Visit: Payer: Self-pay

## 2019-02-20 DIAGNOSIS — B351 Tinea unguium: Secondary | ICD-10-CM | POA: Diagnosis not present

## 2019-02-20 DIAGNOSIS — Z9229 Personal history of other drug therapy: Secondary | ICD-10-CM

## 2019-02-20 DIAGNOSIS — L84 Corns and callosities: Secondary | ICD-10-CM | POA: Diagnosis not present

## 2019-02-20 DIAGNOSIS — M79676 Pain in unspecified toe(s): Secondary | ICD-10-CM

## 2019-02-20 DIAGNOSIS — M79609 Pain in unspecified limb: Secondary | ICD-10-CM

## 2019-02-20 NOTE — Patient Instructions (Signed)

## 2019-02-21 NOTE — Progress Notes (Signed)
Subjective: Alyssa Luna presents today with painful, thick toenails 1-5 b/l that she cannot cut and which interfere with daily activities.  Pain is aggravated when wearing enclosed shoe gear.  Patient states she had cemented vertebrae performed at [facility] about 2 weeks ago. She is doing well from the procedure.  She also had Gamma knife performed last Tuesday to address her tremors.  She voices no new pedal concerns on today's visit.   Current Outpatient Medications:  .  acetaminophen (TYLENOL) 325 MG tablet, Take by mouth., Disp: , Rfl:  .  amiodarone (PACERONE) 200 MG tablet, Take 200 mg by mouth daily., Disp: , Rfl:  .  amLODipine (NORVASC) 5 MG tablet, TAKE 1 TABLET BY MOUTH EVERY DAY, Disp: , Rfl:  .  amoxicillin (AMOXIL) 500 MG tablet, TAKE 1 TABLET BY MOUTH EVERY 8 HOURS UNTIL FINISHED, Disp: , Rfl:  .  azithromycin (ZITHROMAX) 250 MG tablet, TAKE 2 TABLETS BY MOUTH EVERY DAY FOR 3 DAYS, Disp: , Rfl:  .  cetirizine (ZYRTEC) 10 MG tablet, Take 10 mg by mouth daily as needed for allergies., Disp: , Rfl:  .  chlorhexidine (PERIDEX) 0.12 % solution, PLEASE SEE ATTACHED FOR DETAILED DIRECTIONS, Disp: , Rfl:  .  Cholecalciferol (D3 DOTS) 2000 units TBDP, Take by mouth daily., Disp: , Rfl:  .  Cyanocobalamin (VITAMIN B-12 IJ), Inject as directed every 30 (thirty) days., Disp: , Rfl:  .  denosumab (PROLIA) 60 MG/ML SOLN injection, Inject 60 mg into the skin every 6 (six) months. Administer in upper arm, thigh, or abdomen Unable to tolerate oral meds Dx severe osteoporosis, Disp: 1.8 mL, Rfl: 6 .  diclofenac sodium (VOLTAREN) 1 % GEL, Apply topically., Disp: , Rfl:  .  diphenhydrAMINE HCl (BENADRYL ALLERGY CHILDRENS PO), Take by mouth at bedtime as needed. Takes at night when itching, Disp: , Rfl:  .  famotidine (PEPCID) 20 MG tablet, Take 1 tablet (20 mg total) by mouth 2 (two) times daily., Disp: 180 tablet, Rfl: 1 .  furosemide (LASIX) 20 MG tablet, Take 1 tablet (20 mg total) by mouth  daily as needed for edema., Disp: 30 tablet, Rfl: 3 .  gabapentin (NEURONTIN) 600 MG tablet, TAKE 1/2 TABLET BY MOUTH 4 TIMES A DAY, Disp: , Rfl:  .  hydrocortisone 2.5 % cream, Apply 1 application topically 3 (three) times daily as needed (itching)., Disp: , Rfl:  .  ibuprofen (ADVIL) 200 MG tablet, Take by mouth., Disp: , Rfl:  .  levothyroxine (SYNTHROID) 25 MCG tablet, Take 1 tablet (25 mcg total) by mouth daily before breakfast., Disp: 30 tablet, Rfl: 5 .  montelukast (SINGULAIR) 10 MG tablet, TAKE 1 TABLET BY MOUTH EVERY DAY, Disp: 90 tablet, Rfl: 1 .  potassium chloride (K-DUR) 10 MEQ tablet, Take 1 tablet (10 mEq total) by mouth daily. Take while on Furosemide, Disp: 30 tablet, Rfl: 3 .  predniSONE (STERAPRED UNI-PAK 48 TAB) 5 MG (48) TBPK tablet, See admin instructions. see package, Disp: , Rfl:  .  Rivaroxaban (XARELTO) 15 MG TABS tablet, Take 15 mg by mouth daily. , Disp: , Rfl:  .  Simethicone (GAS-X PO), Take 1 tablet by mouth daily as needed (flatulence). , Disp: , Rfl:  .  sulfamethoxazole-trimethoprim (BACTRIM DS) 800-160 MG tablet, TAKE 1 TABLET (160 MG OF TRIMETHOPRIM TOTAL) BY MOUTH ONCE DAILY FOR 14 DAYS, Disp: , Rfl:  .  traMADol (ULTRAM) 50 MG tablet, Take 1 tablet (50 mg total) by mouth every 6 (six) hours as needed., Disp:  30 tablet, Rfl: 0 .  Wheat Dextrin (BENEFIBER PO), Take 1 tablet by mouth daily., Disp: , Rfl:   Allergies  Allergen Reactions  . Beef Extract Other (See Comments)    Alpha-gal allergy testing positive June 2018  . Galactose Nausea Only    Per patient, cannot have alpha-gal since tic bite episode.  . Pravastatin Anaphylaxis and Other (See Comments)    Legs ache  . Acetaminophen Other (See Comments)  . Colchicine Other (See Comments)  . Doxycycline Other (See Comments)    Nausea - able to take w/food  . Shellfish Allergy Other (See Comments)    Unknown  . Tikosyn [Dofetilide] Other (See Comments)    Unknown reaction and was told never to take it  due to problems during sleep   . Actonel [Risedronate Sodium] Other (See Comments)    Not known  . Alendronate Other (See Comments)    NOT KNOWN  . Augmentin [Amoxicillin-Pot Clavulanate] Rash    Has patient had a PCN reaction causing immediate rash, facial/tongue/throat swelling, SOB or lightheadedness with hypotension: Yes Has patient had a PCN reaction causing severe rash involving mucus membranes or skin necrosis:No Has patient had a PCN reaction that required hospitalization: No Has patient had a PCN reaction occurring within the last 10 years: No If all of the above answers are "NO", then may proceed with Cephalosporin use.   . Ciprofloxacin Nausea Only  . Evista [Raloxifene] Other (See Comments)    Unknown reaction  . Flagyl [Metronidazole] Other (See Comments)    Not known  . Fosamax [Alendronate Sodium] Other (See Comments)    NOT KNOWN  . Gold-Containing Drug Products Other (See Comments)    Per Dr  . Loma Sousa [Escitalopram Oxalate] Other (See Comments)    shaky  . Risedronate Other (See Comments)    Unknown reaction  . Simvastatin Other (See Comments)    REACTION: leg cramps, weakness  . Tramadol Other (See Comments)    Mental disturbance changes    Objective: There were no vitals filed for this visit.  Vascular Examination: Capillary refill time immediate x 10 digits  Dorsalis pedis and Posterior tibial pulses palpable b/l  Digital hair present x 10 digits  Skin temperature gradient WNL b/l  Dermatological Examination: Skin with normal turgor, texture and tone b/l  Toenails 1-5 b/l discolored, thick, dystrophic with subungual debris and pain with palpation to nailbeds due to thickness of nails.  Hyperkeratotic lesion dorsal 5th digit PIPJ. No erythema, no edema, no drainage, no flocculence noted.  Musculoskeletal: Muscle strength 5/5 to all LE muscle groups  Hammertoe deformity right 5th digit.  No pain, crepitus or joint limitation noted with ROM.    Neurological: Sensation intact with 10 gram monofilament.  Vibratory sensation intact.  Assessment: Painful onychomycosis toenails 1-5 b/l in patient on blood thinner Corn right 5th digit  Plan: 1. Toenails 1-5 b/l were debrided in length and girth without iatrogenic bleeding. Corn(s) pared right 5th digit utilizing sterile scalpel blade without incident. Patient to continue soft, supportive shoe gear daily. Patient to report any pedal injuries to medical professional immediately. Follow up 3 months.  Patient/POA to call should there be a concern in the interim.

## 2019-02-24 ENCOUNTER — Ambulatory Visit (INDEPENDENT_AMBULATORY_CARE_PROVIDER_SITE_OTHER): Payer: Medicare Other | Admitting: Internal Medicine

## 2019-02-24 ENCOUNTER — Ambulatory Visit: Payer: Self-pay | Admitting: Internal Medicine

## 2019-02-24 ENCOUNTER — Encounter: Payer: Self-pay | Admitting: Internal Medicine

## 2019-02-24 ENCOUNTER — Other Ambulatory Visit: Payer: Self-pay

## 2019-02-24 DIAGNOSIS — R195 Other fecal abnormalities: Secondary | ICD-10-CM | POA: Diagnosis not present

## 2019-02-24 DIAGNOSIS — I10 Essential (primary) hypertension: Secondary | ICD-10-CM | POA: Diagnosis not present

## 2019-02-24 NOTE — Assessment & Plan Note (Signed)
Checking CBC and address as needed. Given symptoms of blood loss and advised if present go to ER immediately. She is on xarelto which makes GI bleeding more likely. No iron supplement or maalox/mylanta.

## 2019-02-24 NOTE — Progress Notes (Signed)
Virtual Visit via Audio Note  I connected with Alyssa Luna on 02/24/19 at  4:00 PM EDT by an audio-only enabled telemedicine application and verified that I am speaking with the correct person using two identifiers.  The patient and the provider were at separate locations throughout the entire encounter.   I discussed the limitations of evaluation and management by telemedicine and the availability of in person appointments. The patient expressed understanding and agreed to proceed.  History of Present Illness: The patient is a 85 y.o. female with visit for recent lumbar cementing 3 weeks ago. She had a brain gamma knife to help her essential tremor. Her BP is running high and 161/70 this morning. Has been similar all day. Is feeling weak recently. Is taking her amlodipine and takes this in the morning normally. Denies any change in diet recently. Started today. Has no headaches or chest pains. Denies fevers or chills. Denies diarrhea or constipation. She is also having some leg swelling which was increasing in size.. She took an antibiotic which helped it to go down. In the last two weeks she has had more swelling again. She denies pain in the leg. She has some chronic skin color change which is not different. Overall it is worsening. Has tried nothing in the last few days other than some old cream. She is also having some dark stools, she thought that this was related to eating blueberries.   Observations/Objective: Voice strong, no coughing or dyspnea during visit, A and O times 3  Assessment and Plan: See problem oriented charting  Follow Up Instructions: Needs labs, lasix 20 mg daily for 3-4 days, if dizziness, lightheadedness, chest pains go to ER sooner for evaluation since she cannot get labs until Monday.  Visit time 21 minutes: that time was spent in face to face counseling and coordination of care with the patient: counseled about as above  I discussed the assessment and treatment  plan with the patient. The patient was provided an opportunity to ask questions and all were answered. The patient agreed with the plan and demonstrated an understanding of the instructions.   The patient was advised to call back or seek an in-person evaluation if the symptoms worsen or if the condition fails to improve as anticipated.  Hoyt Koch, MD

## 2019-02-24 NOTE — Telephone Encounter (Signed)
Pt. Reports she has several symptoms she is concerned about. Her BP is elevated today - 161/70, has left leg swelling that gets worse as the day goes on, and also having black stools.She is on Xarelto. Warm transfer to [facility] in the practice for a visit.  Answer Assessment - Initial Assessment Questions 1. BLOOD PRESSURE: "What is the blood pressure?" "Did you take at least two measurements 5 minutes apart?"     161/70 2. ONSET: "When did you take your blood pressure?"     This morning 3. HOW: "How did you obtain the blood pressure?" (e.g., visiting nurse, automatic home BP monitor)     Home monitor 4. HISTORY: "Do you have a history of high blood pressure?"     Yes 5. MEDICATIONS: "Are you taking any medications for blood pressure?" "Have you missed any doses recently?"     Yes 6. OTHER SYMPTOMS: "Do you have any symptoms?" (e.g., headache, chest pain, blurred vision, difficulty breathing, weakness)     Feels weak, dark stools, left leg swelling 7. PREGNANCY: "Is there any chance you are pregnant?" "When was your last menstrual period?"     No  Protocols used: HIGH BLOOD PRESSURE-A-AH

## 2019-02-24 NOTE — Assessment & Plan Note (Signed)
BP running slightly higher than normal without symptoms. Will add lasix 3-4 days to help swelling in leg as well as BP and then stop. Taking amlodipine 5 mg daily which was not adjusted. Checking CMP on Monday after lasix.

## 2019-02-24 NOTE — Telephone Encounter (Signed)
Noted  

## 2019-02-27 ENCOUNTER — Ambulatory Visit: Payer: Medicare Other

## 2019-03-02 ENCOUNTER — Telehealth: Payer: Self-pay | Admitting: Specialist

## 2019-03-02 NOTE — Telephone Encounter (Signed)
Moved her appt to 03/09/2019 @ 145

## 2019-03-02 NOTE — Telephone Encounter (Signed)
Patient called left voicemail message needing an earlier appointment with Dr Louanne Skye. Patient asked to be put on the wait list. The number to contact patient is [phone]

## 2019-03-07 ENCOUNTER — Other Ambulatory Visit: Payer: Self-pay | Admitting: Internal Medicine

## 2019-03-09 ENCOUNTER — Encounter: Payer: Self-pay | Admitting: Specialist

## 2019-03-09 ENCOUNTER — Other Ambulatory Visit: Payer: Self-pay

## 2019-03-09 ENCOUNTER — Ambulatory Visit (INDEPENDENT_AMBULATORY_CARE_PROVIDER_SITE_OTHER): Payer: Medicare Other | Admitting: Specialist

## 2019-03-09 ENCOUNTER — Ambulatory Visit: Payer: Self-pay

## 2019-03-09 VITALS — BP 127/58 | HR 61 | Ht 60.0 in | Wt 129.0 lb

## 2019-03-09 DIAGNOSIS — M4156 Other secondary scoliosis, lumbar region: Secondary | ICD-10-CM

## 2019-03-09 DIAGNOSIS — M48061 Spinal stenosis, lumbar region without neurogenic claudication: Secondary | ICD-10-CM

## 2019-03-09 DIAGNOSIS — S32040A Wedge compression fracture of fourth lumbar vertebra, initial encounter for closed fracture: Secondary | ICD-10-CM

## 2019-03-09 MED ORDER — TIZANIDINE HCL 2 MG PO TABS: 2.0000 mg | ORAL_TABLET | Freq: Four times a day (QID) | ORAL | 0 refills | Status: DC | PRN

## 2019-03-09 NOTE — Patient Instructions (Signed)
Avoid bending, stooping and avoid lifting weights greater than 10 lbs. Avoid prolong standing and walking. Avoid frequent bending and stooping  No lifting greater than 10 lbs. May use ice or moist heat for pain. Weight loss is of benefit. Handicap license is approved. Dr. Romona Curls secretary/Assistant will call to arrange for epidural steroid injection  Discontinue tramadol and take tizanidine for pain, 2 mg tablet every 6 hours for pain and spasm.
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
## 2019-03-13 ENCOUNTER — Other Ambulatory Visit: Payer: Self-pay | Admitting: Internal Medicine

## 2019-03-17 ENCOUNTER — Other Ambulatory Visit: Payer: Self-pay | Admitting: Internal Medicine

## 2019-03-19 ENCOUNTER — Other Ambulatory Visit: Payer: Self-pay | Admitting: Internal Medicine

## 2019-03-20 ENCOUNTER — Other Ambulatory Visit: Payer: Self-pay | Admitting: Internal Medicine

## 2019-03-23 ENCOUNTER — Other Ambulatory Visit: Payer: Self-pay

## 2019-03-23 ENCOUNTER — Ambulatory Visit: Payer: Medicare Other

## 2019-03-23 ENCOUNTER — Ambulatory Visit (INDEPENDENT_AMBULATORY_CARE_PROVIDER_SITE_OTHER): Payer: Medicare Other | Admitting: Internal Medicine

## 2019-03-23 ENCOUNTER — Encounter: Payer: Self-pay | Admitting: Internal Medicine

## 2019-03-23 VITALS — BP 120/70 | HR 65 | Temp 98.7°F | Ht 60.0 in | Wt 123.0 lb

## 2019-03-23 DIAGNOSIS — G250 Essential tremor: Secondary | ICD-10-CM | POA: Diagnosis not present

## 2019-03-23 DIAGNOSIS — I872 Venous insufficiency (chronic) (peripheral): Secondary | ICD-10-CM

## 2019-03-23 DIAGNOSIS — I10 Essential (primary) hypertension: Secondary | ICD-10-CM | POA: Diagnosis not present

## 2019-03-23 DIAGNOSIS — E538 Deficiency of other specified B group vitamins: Secondary | ICD-10-CM | POA: Diagnosis not present

## 2019-03-23 DIAGNOSIS — M810 Age-related osteoporosis without current pathological fracture: Secondary | ICD-10-CM

## 2019-03-23 DIAGNOSIS — R600 Localized edema: Secondary | ICD-10-CM

## 2019-03-23 MED ORDER — CYANOCOBALAMIN 1000 MCG/ML IJ SOLN
1000.0000 ug | Freq: Once | INTRAMUSCULAR | Status: AC
  Administered 2019-03-23: 1000 ug via INTRAMUSCULAR

## 2019-03-23 MED ORDER — FAMOTIDINE 20 MG PO TABS: 20.0000 mg | ORAL_TABLET | Freq: Two times a day (BID) | ORAL | 3 refills | Status: DC

## 2019-03-23 NOTE — Patient Instructions (Signed)
Stop Amlodipine

## 2019-03-23 NOTE — Progress Notes (Signed)
Subjective:  Patient ID: Alyssa Luna, female    DOB: 10-Feb-1934  Age: 85 y.o. MRN: [id number]  CC: Leg Swelling (bilateral )   HPI CHIKITA DOGAN presents for HTN, leg edema, A fib Pt had a tremor procedure vertebroplasty LBP - having an epidural  Outpatient Medications Prior to Visit  Medication Sig Dispense Refill  . acetaminophen (TYLENOL) 325 MG tablet Take by mouth.    [name] amiodarone (PACERONE) 200 MG tablet Take 200 mg by mouth daily.    [name] amLODipine (NORVASC) 5 MG tablet TAKE 1 TABLET BY MOUTH EVERY DAY    . amoxicillin (AMOXIL) 500 MG tablet TAKE 1 TABLET BY MOUTH EVERY 8 HOURS UNTIL FINISHED    . azithromycin (ZITHROMAX) 250 MG tablet TAKE 2 TABLETS BY MOUTH EVERY DAY FOR 3 DAYS    . cetirizine (ZYRTEC) 10 MG tablet Take 10 mg by mouth daily as needed for allergies.    . chlorhexidine (PERIDEX) 0.12 % solution PLEASE SEE ATTACHED FOR DETAILED DIRECTIONS    . Cholecalciferol (D3 DOTS) 2000 units TBDP Take by mouth daily.    . Cyanocobalamin (VITAMIN B-12 IJ) Inject as directed every 30 (thirty) days.    [name] denosumab (PROLIA) 60 MG/ML SOLN injection Inject 60 mg into the skin every 6 (six) months. Administer in upper arm, thigh, or abdomen Unable to tolerate oral meds Dx severe osteoporosis 1.8 mL 6  . diclofenac sodium (VOLTAREN) 1 % GEL Apply topically.    . diphenhydrAMINE HCl (BENADRYL ALLERGY CHILDRENS PO) Take by mouth at bedtime as needed. Takes at night when itching    . famotidine (PEPCID) 20 MG tablet Take 1 tablet (20 mg total) by mouth 2 (two) times daily. 180 tablet 1  . furosemide (LASIX) 20 MG tablet TAKE 1 TABLET (20 MG TOTAL) BY MOUTH DAILY AS NEEDED FOR EDEMA. 90 tablet 1  . gabapentin (NEURONTIN) 600 MG tablet TAKE 1/2 TABLET BY MOUTH 4 TIMES A DAY    . hydrocortisone 2.5 % cream Apply 1 application topically 3 (three) times daily as needed (itching).    [name] ibuprofen (ADVIL) 200 MG tablet Take by mouth.    . levothyroxine (SYNTHROID) 25 MCG tablet  Take 1 tablet (25 mcg total) by mouth daily before breakfast. 30 tablet 5  . montelukast (SINGULAIR) 10 MG tablet TAKE 1 TABLET BY MOUTH EVERY DAY 90 tablet 1  . potassium chloride (K-DUR) 10 MEQ tablet TAKE 1 TABLET (10 MEQ TOTAL) BY MOUTH DAILY. TAKE WHILE ON FUROSEMIDE 90 tablet 1  . predniSONE (STERAPRED UNI-PAK 48 TAB) 5 MG (48) TBPK tablet See admin instructions. see package    . Rivaroxaban (XARELTO) 15 MG TABS tablet Take 15 mg by mouth daily.     . Simethicone (GAS-X PO) Take 1 tablet by mouth daily as needed (flatulence).     [name] sulfamethoxazole-trimethoprim (BACTRIM DS) 800-160 MG tablet TAKE 1 TABLET (160 MG OF TRIMETHOPRIM TOTAL) BY MOUTH ONCE DAILY FOR 14 DAYS    . tiZANidine (ZANAFLEX) 2 MG tablet Take 1 tablet (2 mg total) by mouth every 6 (six) hours as needed for muscle spasms. 30 tablet 0  . traMADol (ULTRAM) 50 MG tablet Take 1 tablet (50 mg total) by mouth every 6 (six) hours as needed. 30 tablet 0  . Wheat Dextrin (BENEFIBER PO) Take 1 tablet by mouth daily.     No facility-administered medications prior to visit.     ROS: Review of Systems  Constitutional: Negative for activity change, appetite change, chills,  fatigue and unexpected weight change.  HENT: Negative for congestion, mouth sores and sinus pressure.   Eyes: Negative for visual disturbance.  Respiratory: Negative for cough and chest tightness.   Cardiovascular: Positive for leg swelling.  Gastrointestinal: Negative for abdominal pain and nausea.  Genitourinary: Negative for difficulty urinating, frequency and vaginal pain.  Musculoskeletal: Positive for arthralgias and gait problem. Negative for back pain.  Skin: Negative for pallor and rash.  Neurological: Negative for dizziness, tremors, weakness, numbness and headaches.  Psychiatric/Behavioral: Negative for confusion, sleep disturbance and suicidal ideas.    Objective:  BP 120/70 (BP Location: Left Arm, Patient Position: Sitting, Cuff Size: Normal)    Pulse 65   Temp 98.7 F (37.1 C) (Oral)   Ht 5' (1.524 m)   Wt 123 lb (55.8 kg)   SpO2 97%   BMI 24.02 kg/m   BP Readings from Last 3 Encounters:  03/23/19 120/70  03/09/19 (!) 127/58  02/07/19 (!) 128/59    Wt Readings from Last 3 Encounters:  03/23/19 123 lb (55.8 kg)  03/09/19 129 lb (58.5 kg)  02/07/19 126 lb (57.2 kg)    Physical Exam Constitutional:      General: She is not in acute distress.    Appearance: She is well-developed.  HENT:     Head: Normocephalic.     Right Ear: External ear normal.     Left Ear: External ear normal.     Nose: Nose normal.  Eyes:     General:        Right eye: No discharge.        Left eye: No discharge.     Conjunctiva/sclera: Conjunctivae normal.     Pupils: Pupils are equal, round, and reactive to light.  Neck:     Musculoskeletal: Normal range of motion and neck supple.     Thyroid: No thyromegaly.     Vascular: No JVD.     Trachea: No tracheal deviation.  Cardiovascular:     Rate and Rhythm: Normal rate and regular rhythm.     Heart sounds: Normal heart sounds.  Pulmonary:     Effort: No respiratory distress.     Breath sounds: No stridor. No wheezing.  Abdominal:     General: Bowel sounds are normal. There is no distension.     Palpations: Abdomen is soft. There is no mass.     Tenderness: There is no abdominal tenderness. There is no guarding or rebound.  Musculoskeletal:        General: Tenderness present.     Right lower leg: Edema present.     Left lower leg: Edema present.  Lymphadenopathy:     Cervical: No cervical adenopathy.  Skin:    Findings: No erythema or rash.  Neurological:     Mental Status: She is oriented to person, place, and time.     Cranial Nerves: No cranial nerve deficit.     Motor: No abnormal muscle tone.     Coordination: Coordination normal.     Deep Tendon Reflexes: Reflexes normal.  Psychiatric:        Behavior: Behavior normal.        Thought Content: Thought content normal.         Judgment: Judgment normal.   walker  1+ edema L>R, hyperpigmentation over both distal shins and ankles LS spine w/pain  Lab Results  Component Value Date   WBC 4.0 02/07/2019   HGB 11.5 (L) 02/07/2019   HCT 37.5 02/07/2019   PLT  380 02/07/2019   GLUCOSE 98 12/20/2018   CHOL 160 02/25/2018   TRIG 119.0 02/25/2018   HDL 42.90 02/25/2018   LDLDIRECT 178.0 09/24/2011   LDLCALC 94 02/25/2018   ALT 56 (H) 12/20/2018   AST 82 (H) 12/20/2018   NA 140 12/20/2018   K 4.5 12/20/2018   CL 107 12/20/2018   CREATININE 0.97 12/20/2018   BUN 14 12/20/2018   CO2 26 12/20/2018   TSH 11.91 (H) 12/20/2018   INR 1.3 (H) 02/07/2019    Ir Kypho Lumbar Inc Fx Reduce Bone Bx Uni/bil Cannulation Inc/imaging  Result Date: 02/08/2019 INDICATION: Severe low back pain secondary to compression fractures at T10 and at L2. EXAM: BALLOON KYPHOPLASTY AT T10 AND AT L2. COMPARISON:  MRI MRA of the brain of a few weeks ago. MEDICATIONS: As antibiotic prophylaxis, Ancef 2 g IV was ordered pre-procedure and administered intravenously within 1 hour of incision. ANESTHESIA/SEDATION: Moderate (conscious) sedation was employed during this procedure. A total of Versed 2.5 mg and Fentanyl 75 mcg was administered intravenously. Moderate Sedation Time: 54 minutes. The patient's level of consciousness and vital signs were monitored continuously by radiology nursing throughout the procedure under my direct supervision. FLUOROSCOPY TIME:  Fluoroscopy Time: 21 minutes 6 seconds (6,295 mGy) COMPLICATIONS: None immediate. PROCEDURE: Following a full explanation of the procedure along with the potential associated complications, an informed witnessed consent was obtained. The patient was placed prone on the fluoroscopic table. The skin overlying the thoracolumbar region was then prepped and draped in the usual sterile fashion. The right pedicle at T10, and both pedicles at L2 were then infiltrated with 0.25% bupivacaine, followed by  the advancement of an 11-gauge Jamshidi needle through the right pedicle of T10, and both pedicles at L2 into the posterior one-third at these levels. These were then exchanged for a Kyphon advanced osteo introducer system comprised of a working cannula and a Kyphon osteo drill in the 3 pedicles. The combinations were then advanced over a Kyphon osteo bone pin until the tip of the Kyphon osteo drill was in the posterior third at T10 and at L2. At this time, the bone pins were removed. In a medial trajectory, the combination was advanced until the tip of the working cannula was inside the posterior one-third at T10, and at L2. The osteo drills were removed and a core samples sent for pathologic analysis. Through each working cannulae, a Kyphon bone biopsy device was advanced to within 5 mm of the anterior aspect of T10 and L2. Core samples from these were also sent for pathologic analysis. Through the working cannulae, a Kyphon inflatable bone tamp 20 x 3 was advanced and positioned with the distal marker 5 mm from the anterior aspect of T10 and L2. Crossing of the midline was seen on the AP projection. At this time, the balloons were expanded using contrast via a Kyphon inflation syringe device via microtubing. Inflations were continued until there was apposition with the superior and the inferior endplates into L2, and the inferior endplate at M84. At this time, methylmethacrylate mixture was reconstituted with Tobramycin in the Kyphon bone mixing device system. This was then loaded onto the Kyphon bone fillers. The balloons were deflated and removed followed by the instillation of 4-1/2 bone filler equivalents of methylmethacrylate mixture at L2, and 2 bone filler equivalent of microcatheter mixture at T10 with excellent filling in the AP and lateral projections. No extravasation was noted in the disk spaces or posteriorly into the spinal canal. No epidural  venous contamination was seen. The working cannulae and  the bone fillers were then retrieved and removed. Hemostasis was achieved at the skin entry sites. The patient tolerated the procedure well. IMPRESSION: 1. Status post fluoroscopic-guided needle placement for deep core bone biopsy at T10 and L2. 2. Status post vertebral body augmentation using balloon kyphoplasty at T10 and L2 as described without event. 3. Patient advised to check with her referring MD for results of the biopsy obtained as per the request of her referring MD. Electronically Signed   By: Luanne Bras M.D.   On: 02/07/2019 14:46   Ir Kypho Ea Addl Level Thoracic Or Lumbar  Result Date: 02/08/2019 INDICATION: Severe low back pain secondary to compression fractures at T10 and at L2. EXAM: BALLOON KYPHOPLASTY AT T10 AND AT L2. COMPARISON:  MRI MRA of the brain of a few weeks ago. MEDICATIONS: As antibiotic prophylaxis, Ancef 2 g IV was ordered pre-procedure and administered intravenously within 1 hour of incision. ANESTHESIA/SEDATION: Moderate (conscious) sedation was employed during this procedure. A total of Versed 2.5 mg and Fentanyl 75 mcg was administered intravenously. Moderate Sedation Time: 54 minutes. The patient's level of consciousness and vital signs were monitored continuously by radiology nursing throughout the procedure under my direct supervision. FLUOROSCOPY TIME:  Fluoroscopy Time: 21 minutes 6 seconds (4,627 mGy) COMPLICATIONS: None immediate. PROCEDURE: Following a full explanation of the procedure along with the potential associated complications, an informed witnessed consent was obtained. The patient was placed prone on the fluoroscopic table. The skin overlying the thoracolumbar region was then prepped and draped in the usual sterile fashion. The right pedicle at T10, and both pedicles at L2 were then infiltrated with 0.25% bupivacaine, followed by the advancement of an 11-gauge Jamshidi needle through the right pedicle of T10, and both pedicles at L2 into the posterior  one-third at these levels. These were then exchanged for a Kyphon advanced osteo introducer system comprised of a working cannula and a Kyphon osteo drill in the 3 pedicles. The combinations were then advanced over a Kyphon osteo bone pin until the tip of the Kyphon osteo drill was in the posterior third at T10 and at L2. At this time, the bone pins were removed. In a medial trajectory, the combination was advanced until the tip of the working cannula was inside the posterior one-third at T10, and at L2. The osteo drills were removed and a core samples sent for pathologic analysis. Through each working cannulae, a Kyphon bone biopsy device was advanced to within 5 mm of the anterior aspect of T10 and L2. Core samples from these were also sent for pathologic analysis. Through the working cannulae, a Kyphon inflatable bone tamp 20 x 3 was advanced and positioned with the distal marker 5 mm from the anterior aspect of T10 and L2. Crossing of the midline was seen on the AP projection. At this time, the balloons were expanded using contrast via a Kyphon inflation syringe device via microtubing. Inflations were continued until there was apposition with the superior and the inferior endplates into L2, and the inferior endplate at O35. At this time, methylmethacrylate mixture was reconstituted with Tobramycin in the Kyphon bone mixing device system. This was then loaded onto the Kyphon bone fillers. The balloons were deflated and removed followed by the instillation of 4-1/2 bone filler equivalents of methylmethacrylate mixture at L2, and 2 bone filler equivalent of microcatheter mixture at T10 with excellent filling in the AP and lateral projections. No extravasation was noted in  the disk spaces or posteriorly into the spinal canal. No epidural venous contamination was seen. The working cannulae and the bone fillers were then retrieved and removed. Hemostasis was achieved at the skin entry sites. The patient tolerated the  procedure well. IMPRESSION: 1. Status post fluoroscopic-guided needle placement for deep core bone biopsy at T10 and L2. 2. Status post vertebral body augmentation using balloon kyphoplasty at T10 and L2 as described without event. 3. Patient advised to check with her referring MD for results of the biopsy obtained as per the request of her referring MD. Electronically Signed   By: Luanne Bras M.D.   On: 02/07/2019 14:46    Assessment & Plan:   There are no diagnoses linked to this encounter.   No orders of the defined types were placed in this encounter.    Follow-up: No follow-ups on file.  Walker Kehr, MD

## 2019-03-25 ENCOUNTER — Encounter: Payer: Self-pay | Admitting: Internal Medicine

## 2019-03-25 DIAGNOSIS — I872 Venous insufficiency (chronic) (peripheral): Secondary | ICD-10-CM | POA: Insufficient documentation

## 2019-03-25 NOTE — Assessment & Plan Note (Signed)
On Prolia

## 2019-03-25 NOTE — Assessment & Plan Note (Signed)
Discontinue amlodipine due to persistent swelling and the risk of trophic ulcers

## 2019-03-25 NOTE — Assessment & Plan Note (Signed)
Discontinue amlodipine again due to swelling

## 2019-03-25 NOTE — Assessment & Plan Note (Addendum)
Discontinue amlodipine  Treat chronic venous insufficiency

## 2019-03-25 NOTE — Assessment & Plan Note (Signed)
Treated with stereotaxic surgery

## 2019-03-29 ENCOUNTER — Ambulatory Visit: Payer: Medicare Other | Admitting: Specialist

## 2019-03-31 ENCOUNTER — Encounter: Payer: Self-pay | Admitting: Sports Medicine

## 2019-03-31 ENCOUNTER — Ambulatory Visit: Payer: Medicare Other | Admitting: Sports Medicine

## 2019-03-31 ENCOUNTER — Other Ambulatory Visit: Payer: Self-pay

## 2019-03-31 ENCOUNTER — Other Ambulatory Visit: Payer: Self-pay | Admitting: Sports Medicine

## 2019-03-31 ENCOUNTER — Ambulatory Visit (INDEPENDENT_AMBULATORY_CARE_PROVIDER_SITE_OTHER): Payer: Medicare Other

## 2019-03-31 VITALS — Temp 98.5°F | Resp 16

## 2019-03-31 DIAGNOSIS — M25571 Pain in right ankle and joints of right foot: Secondary | ICD-10-CM

## 2019-03-31 DIAGNOSIS — L97319 Non-pressure chronic ulcer of right ankle with unspecified severity: Secondary | ICD-10-CM | POA: Diagnosis not present

## 2019-03-31 DIAGNOSIS — I83013 Varicose veins of right lower extremity with ulcer of ankle: Secondary | ICD-10-CM

## 2019-03-31 DIAGNOSIS — I739 Peripheral vascular disease, unspecified: Secondary | ICD-10-CM | POA: Diagnosis not present

## 2019-03-31 DIAGNOSIS — L03115 Cellulitis of right lower limb: Secondary | ICD-10-CM

## 2019-03-31 MED ORDER — MUPIROCIN 2 % EX OINT: TOPICAL_OINTMENT | CUTANEOUS | 0 refills | Status: DC
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
## 2019-04-02 LAB — WOUND CULTURE: Organism ID, Bacteria: NONE SEEN

## 2019-04-03 ENCOUNTER — Other Ambulatory Visit: Payer: Self-pay | Admitting: Sports Medicine

## 2019-04-03 DIAGNOSIS — I83013 Varicose veins of right lower extremity with ulcer of ankle: Secondary | ICD-10-CM

## 2019-04-03 DIAGNOSIS — L97319 Non-pressure chronic ulcer of right ankle with unspecified severity: Secondary | ICD-10-CM

## 2019-04-03 MED ORDER — GENTAMICIN SULFATE 0.1 % EX OINT: 1.0000 "application " | TOPICAL_OINTMENT | Freq: Three times a day (TID) | CUTANEOUS | 0 refills | Status: DC

## 2019-04-03 NOTE — Progress Notes (Signed)
Add on using Gentamicin topically since patient has many allergies to PO antibiotics -Dr. Cannon Kettle

## 2019-04-04 ENCOUNTER — Telehealth: Payer: Self-pay | Admitting: *Deleted

## 2019-04-04 ENCOUNTER — Encounter: Payer: Self-pay | Admitting: Physical Medicine and Rehabilitation

## 2019-04-04 ENCOUNTER — Ambulatory Visit: Payer: Self-pay

## 2019-04-04 ENCOUNTER — Ambulatory Visit (INDEPENDENT_AMBULATORY_CARE_PROVIDER_SITE_OTHER): Payer: Medicare Other | Admitting: Physical Medicine and Rehabilitation

## 2019-04-04 VITALS — BP 130/61 | HR 65

## 2019-04-04 DIAGNOSIS — M4805 Spinal stenosis, thoracolumbar region: Secondary | ICD-10-CM | POA: Diagnosis not present

## 2019-04-04 MED ORDER — DEXAMETHASONE SODIUM PHOSPHATE 10 MG/ML IJ SOLN
15.0000 mg | Freq: Once | INTRAMUSCULAR | Status: AC
  Administered 2019-04-04: 15 mg

## 2019-04-04 NOTE — Telephone Encounter (Signed)
I called pt and explained that Dr. Cannon Kettle had ordered an antibiotic cream due to her numerous allergies to oral medications. Pt states she just got the pharmacy call and will go to pick up now and has an appt scheduled.

## 2019-04-04 NOTE — Progress Notes (Signed)
Called Pt. And notified her of her wound cx results and the Rx that was sent to her pharmacy and pt stated understanding. I advised the pt. To give [place] a call if she has any problems or concerns.

## 2019-04-04 NOTE — Progress Notes (Signed)
 .  Numeric Pain Rating Scale and Functional Assessment Average Pain 8   In the last MONTH (on 0-10 scale) has pain interfered with the following?  1. General activity like being  able to carry out your everyday physical activities such as walking, climbing stairs, carrying groceries, or moving a chair?  Rating(7)   +Driver, +BT(xarelto, ok for inj), -Dye Allergies.

## 2019-04-04 NOTE — Telephone Encounter (Signed)
Pt called states she has a red painful spot on he leg and it hurts, she hadn't heard anything from the [place] office, except a Ivonne called and she didn't know any Ivonne.

## 2019-04-05 NOTE — Procedures (Signed)
Thoracic and lumbar Transforaminal Epidural Steroid Injection -infra neural approach with Fluoroscopic Guidance  Patient: Alyssa Luna      Date of Birth: Jan 04, 1934 MRN: [id number] PCP: Cassandria Anger, MD      Visit Date: 04/04/2019   Universal Protocol:    Date/Time: 04/04/2019  Consent Given By: the patient  Position: PRONE  Additional Comments: Vital signs were monitored before and after the procedure. Patient was prepped and draped in the usual sterile fashion. The correct patient, procedure, and site was verified.   Injection Procedure Details:  Procedure Site One Meds Administered:  Meds ordered this encounter  Medications  . dexamethasone (DECADRON) injection 15 mg    Laterality: Left  Location/Site:  T12-L1 L1-L2  Needle size: 22 G  Needle type: Spinal  Needle Placement: Transforaminal  Findings:    -Comments: Excellent flow of contrast along the nerve and into the epidural space.  Procedure Details: After squaring off the end-plates to get a true AP view, the C-arm was positioned so that an oblique view of the foramen as noted above was visualized. The target area is "low in the hole "at the superior articulating process of the facet joint for the bottom vertebral body and essentially Kambian's  triangle. The soft tissues overlying this structure were infiltrated with 2-3 ml. of 1% Lidocaine without Epinephrine.  The spinal needle was inserted toward the target using a "trajectory" view along the fluoroscope beam.  Under AP and lateral visualization, the needle was advanced so it did not puncture dura and was located close the 6 O'Clock position of the pedical in AP tracterory. Biplanar projections were used to confirm position. Aspiration was confirmed to be negative for CSF and/or blood. A 1-2 ml. volume of Isovue-250 was injected and flow of contrast was noted at each level. Radiographs were obtained for documentation purposes.   After attaining  the desired flow of contrast documented above, a 0.5 to 1.0 ml test dose of 0.25% Marcaine was injected into each respective transforaminal space.  The patient was observed for 90 seconds post injection.  After no sensory deficits were reported, and normal lower extremity motor function was noted,   the above injectate was administered so that equal amounts of the injectate were placed at each foramen (level) into the transforaminal epidural space.   Additional Comments:  The patient tolerated the procedure well Dressing: 2 x 2 sterile gauze and Band-Aid    Post-procedure details: Patient was observed during the procedure. Post-procedure instructions were reviewed.  Patient left the clinic in stable condition.

## 2019-04-05 NOTE — Progress Notes (Signed)
Alyssa Luna - 85 y.o. female MRN [id number]  Date of birth: 27-Nov-1933  Office Visit Note: Visit Date: 04/04/2019 PCP: Cassandria Anger, MD Referred by: Cassandria Anger, MD  Subjective: Chief Complaint  Patient presents with  . Middle Back - Pain  . Lower Back - Pain   HPI:  Alyssa Luna is a 85 y.o. female who comes in today At the request of Dr. Basil Dess for diagnostic and hopefully therapeutic left T12 and left L1 transforaminal epidural steroid injection.  Patient has history of scoliosis and degenerative spine with osteoporosis and multiple compression fractures.  Have seen her in the remote past through Dr. Louanne Skye.  Since have seen her and most recently she has had a compression fracture at T10 and L2 status post kyphoplasty by Dr. Estanislado Pandy.  She reports those kyphoplasty did not really seem to help much.  She has thoracolumbar pain worse with standing and ambulating.  ROS Otherwise per HPI.  Assessment & Plan: Visit Diagnoses:  1. Spinal stenosis of thoracolumbar region     Plan: No additional findings.   Meds & Orders:  Meds ordered this encounter  Medications  . dexamethasone (DECADRON) injection 15 mg    Orders Placed This Encounter  Procedures  . XR C-ARM NO REPORT  . Epidural Steroid injection    Follow-up: No follow-ups on file.   Procedures: No procedures performed  Thoracic and lumbar Transforaminal Epidural Steroid Injection -infra neural approach with Fluoroscopic Guidance  Patient: [name]      Date of Birth: August 31, 1933 MRN: [id number] PCP: Cassandria Anger, MD      Visit Date: 04/04/2019   Universal Protocol:    Date/Time: 04/04/2019  Consent Given By: the patient  Position: PRONE  Additional Comments: Vital signs were monitored before and after the procedure. Patient was prepped and draped in the usual sterile fashion. The correct patient, procedure, and site was verified.   Injection Procedure Details:   Procedure Site One Meds Administered:  Meds ordered this encounter  Medications  . dexamethasone (DECADRON) injection 15 mg    Laterality: Left  Location/Site:  T12-L1 L1-L2  Needle size: 22 G  Needle type: Spinal  Needle Placement: Transforaminal  Findings:    -Comments: Excellent flow of contrast along the nerve and into the epidural space.  Procedure Details: After squaring off the end-plates to get a true AP view, the C-arm was positioned so that an oblique view of the foramen as noted above was visualized. The target area is "low in the hole "at the superior articulating process of the facet joint for the bottom vertebral body and essentially Alyssa Luna's  triangle. The soft tissues overlying this structure were infiltrated with 2-3 ml. of 1% Lidocaine without Epinephrine.  The spinal needle was inserted toward the target using a "trajectory" view along the fluoroscope beam.  Under AP and lateral visualization, the needle was advanced so it did not puncture dura and was located close the 6 O'Clock position of the pedical in AP tracterory. Biplanar projections were used to confirm position. Aspiration was confirmed to be negative for CSF and/or blood. A 1-2 ml. volume of Isovue-250 was injected and flow of contrast was noted at each level. Radiographs were obtained for documentation purposes.   After attaining the desired flow of contrast documented above, a 0.5 to 1.0 ml test dose of 0.25% Marcaine was injected into each respective transforaminal space.  The patient was observed for 90 seconds post injection.  After  no sensory deficits were reported, and normal lower extremity motor function was noted,   the above injectate was administered so that equal amounts of the injectate were placed at each foramen (level) into the transforaminal epidural space.   Additional Comments:  The patient tolerated the procedure well Dressing: 2 x 2 sterile gauze and Band-Aid    Post-procedure  details: Patient was observed during the procedure. Post-procedure instructions were reviewed.  Patient left the clinic in stable condition.    Clinical History: MRI LUMBAR SPINE WITHOUT CONTRAST  TECHNIQUE: Multiplanar, multisequence MR imaging of the lumbar spine was performed. No intravenous contrast was administered.  COMPARISON:  CT abdomen and pelvis 01/29/2019.  FINDINGS: Segmentation:  Standard.  Alignment: Marked convex right scoliosis with the apex at L2-3 is present.  Vertebrae: The patient has a superior endplate compression fracture of L2 with vertebral body height loss of up to approximately 30%. There is marrow edema in the vertebral body consistent with acute or early subacute injury. Superior endplate compression fractures of T10 and T11 are also identified. There is minimal marrow edema in the superior endplate of A07 consistent with late subacute to remote injury. The T11 fracture is old. The patient is status post vertebral augmentation at L1.  Conus medullaris and cauda equina: Conus extends to the L2 level. Conus and cauda equina appear normal.  Paraspinal and other soft tissues: Negative.  Disc levels:  T9-10 and T10-11 are imaged in the sagittal plane only. The central canal and foramina appear open at both levels.  T11-12: Shallow disc bulge without stenosis.  T12-L1: A left paracentral protrusion is present with some cephalad extension. There is some bony retropulsion off the superior endplate of L1. The central canal the patient's protrusion indents the left side of the thecal sac mildly deflecting the cord in conjunction with bony retropulsion. The foramina are open.  L1-2: Shallow left paracentral protrusion without stenosis. No notable retropulsion off the superior endplate of L2.  L3-4: Shallow disc bulge to the left causes mild narrowing in the left subarticular recess and foramen. The right foramen is open.  L3-4:  There is a shallow disc bulge and some facet degenerative disease. There is mild narrowing in the left subarticular recess. The central canal and right foramen are open.  L4-5: Small right foraminal protrusion and endplate spur are superimposed on a shallow bulge. There is moderate right foraminal narrowing. The central canal and left foramen are open.  L5-S1: Right worse than left facet arthropathy and a shallow disc bulge. No stenosis.  IMPRESSION: Acute or early subacute superior endplate compression fracture of L2 with vertebral body height loss of approximately 30%. There is minimal retropulsion off the superior endplate of L2. No involvement of the posterior elements. The appearance of the fractures consistent with a senile osteoporotic injury.  Late subacute to remote superior endplate compression fracture of T10.  Remote compression fractures of T11 and L1.  Severe convex right scoliosis.  Left paracentral protrusion at T12-L1 mildly deflects the cord at T12-L1 in conjunction with retropulsion off the superior endplate of L1.  Mild narrowing in the left subarticular recess at L3-4 due to a shallow bulge to the left.  Mild left subarticular recess narrowing L3-4 due to a disc bulge.  Moderate right foraminal narrowing L4-5.   Electronically Signed   By: Inge Rise M.D.   On: 01/31/2019 10:35     Objective:  VS:  HT:    WT:   BMI:  BP:130/61  HR:65bpm  TEMP: ( )  RESP:  Physical Exam  Ortho Exam Imaging: Xr C-arm No Report  Result Date: 04/04/2019 Please see Notes tab for imaging impression.

## 2019-04-06 ENCOUNTER — Other Ambulatory Visit: Payer: Self-pay | Admitting: Sports Medicine

## 2019-04-06 ENCOUNTER — Ambulatory Visit: Payer: Medicare Other | Admitting: Specialist

## 2019-04-06 DIAGNOSIS — L97319 Non-pressure chronic ulcer of right ankle with unspecified severity: Secondary | ICD-10-CM

## 2019-04-06 DIAGNOSIS — I83013 Varicose veins of right lower extremity with ulcer of ankle: Secondary | ICD-10-CM

## 2019-04-06 MED ORDER — ERYTHROMYCIN BASE 500 MG PO TBEC: 500.0000 mg | DELAYED_RELEASE_TABLET | Freq: Three times a day (TID) | ORAL | 0 refills | Status: DC

## 2019-04-06 NOTE — Progress Notes (Signed)
Interaction reviewed Patient called requesting Erthromycin in pill form because she thinks that her ankle sore is infected -Dr Chauncey Cruel

## 2019-04-07 ENCOUNTER — Other Ambulatory Visit: Payer: Self-pay

## 2019-04-07 ENCOUNTER — Ambulatory Visit: Payer: Medicare Other | Admitting: Sports Medicine

## 2019-04-07 ENCOUNTER — Encounter: Payer: Self-pay | Admitting: Sports Medicine

## 2019-04-07 VITALS — Temp 97.0°F | Resp 16

## 2019-04-07 DIAGNOSIS — I83013 Varicose veins of right lower extremity with ulcer of ankle: Secondary | ICD-10-CM | POA: Diagnosis not present

## 2019-04-07 DIAGNOSIS — L97319 Non-pressure chronic ulcer of right ankle with unspecified severity: Secondary | ICD-10-CM

## 2019-04-07 DIAGNOSIS — L03115 Cellulitis of right lower limb: Secondary | ICD-10-CM

## 2019-04-07 DIAGNOSIS — M25571 Pain in right ankle and joints of right foot: Secondary | ICD-10-CM

## 2019-04-07 DIAGNOSIS — I739 Peripheral vascular disease, unspecified: Secondary | ICD-10-CM

## 2019-04-07 DIAGNOSIS — M792 Neuralgia and neuritis, unspecified: Secondary | ICD-10-CM

## 2019-04-07 NOTE — Progress Notes (Signed)
Subjective:  Alyssa Luna is a 85 y.o. female patient who returns to office for follow up evaluation of right ankle pain. Reports that the sore is burning and she can not touch it because of the pain. Reports that she has been using Gentamicin and peroxide and not sure if the salve is working, pain is 9/10 and swelling but no drainage with warmth. Patient has not picked up her Erthromycin Rx that I sent yesterday and reports that she didn't realize that it was there.  No other pedal complaints noted at this time.  Patient Active Problem List   Diagnosis Date Noted  . Chronic venous insufficiency 03/25/2019  . Dark stools 02/24/2019  . Meningioma, cerebral ([place]) 02/16/2019  . Grief reaction 10/18/2018  . Dysfunction of left eustachian tube 07/07/2018  . Ear pain, left 06/13/2018  . Colitis, acute 03/14/2018  . Constipation 03/14/2018  . Allergy 04/28/2017  . Dysphonia 03/17/2017  . Arthralgia 02/26/2017  . Dyspnea 01/27/2017  . Tick bite 01/27/2017  . Food allergy 01/27/2017  . Ingrown toenail 12/08/2016  . Acute upper respiratory infection 07/13/2016  . [facility] spotted fever) 04/29/2016  . 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 Onychomycosis 02/04/2016  . Osteoporosis, post-menopausal 12/15/2015  . Lumbar radiculopathy 08/14/2015  . Lumbar scoliosis 08/14/2015  . Long term current use of anticoagulant therapy 07/18/2015  . Dysuria 06/16/2015  . Allergic rhinitis 06/13/2015  . Closed wedge compression fracture of fourth lumbar vertebra ([place])   . Thrush, oral 01/04/2015  . Itching 01/04/2015  . Hypokalemia 11/06/2014  . Fatigue 10/19/2014  . LLQ abdominal pain 10/02/2014  . Swelling of left knee joint 10/02/2014  . Nausea without vomiting 10/02/2014  . DJD (degenerative joint disease) of knee 08/27/2014  . Primary localized osteoarthritis of left knee   . Breast cancer ([place])   . GERD (gastroesophageal reflux disease)   . Preop exam for internal medicine  06/13/2014  . Anxiety state 06/13/2014  . Bradycardia 11/09/2013  . Essential hypertension 11/09/2013  . Encounter for therapeutic drug monitoring 09/08/2013  . Well adult exam 05/21/2013  . Preop cardiovascular exam 05/08/2013  . Tremor, essential 12/14/2012  . Right hip pain 05/10/2012  . Vertigo 03/09/2012  . Dyslipidemia 03/31/2011  . Meningioma ([place]) 02/03/2011  . TIA (transient ischemic attack) 11/13/2010  . Abnormal CT of brain 11/13/2010  . CAROTID BRUIT 07/28/2010  . Edema 05/02/2010  . Atrial fibrillation ([place]) 04/08/2010  . SYNCOPE 03/31/2010  . LOW BACK PAIN 06/05/2009  . Chronic maxillary sinusitis 01/31/2009  . EFFUSION OF JOINT OTHER SPECIFIED SITE 01/31/2009  . Diarrhea 05/31/2008  . ABNORMAL CHEST XRAY 05/15/2008  . HEMATURIA, MICROSCOPIC, HX OF 08/04/2007  . B12 deficiency 03/21/2007  . Vitamin D deficiency 03/21/2007  . Disease of tricuspid valve 03/21/2007  . DIVERTICULOSIS, COLON 03/21/2007  . Osteoarthritis 03/21/2007  . Osteoporosis 03/21/2007  . Personal history of malignant neoplasm of breast 03/21/2007  . COLONIC POLYPS, HX OF 03/21/2007    Current Outpatient Medications on File Prior to Visit  Medication Sig Dispense Refill  . acetaminophen (TYLENOL) 325 MG tablet Take by mouth.    [name] amiodarone (PACERONE) 200 MG tablet Take 200 mg by mouth daily.    [name] amoxicillin (AMOXIL) 500 MG tablet TAKE 1 TABLET BY MOUTH EVERY 8 HOURS UNTIL FINISHED    . azithromycin (ZITHROMAX) 250 MG tablet TAKE 2 TABLETS BY MOUTH EVERY DAY FOR 3 DAYS    . cetirizine (ZYRTEC) 10 MG tablet Take 10 mg by  mouth daily as needed for allergies.    . chlorhexidine (PERIDEX) 0.12 % solution PLEASE SEE ATTACHED FOR DETAILED DIRECTIONS    . Cholecalciferol (D3 DOTS) 2000 units TBDP Take by mouth daily.    . Cyanocobalamin (VITAMIN B-12 IJ) Inject as directed every 30 (thirty) days.    [name] denosumab (PROLIA) 60 MG/ML SOLN injection Inject 60 mg into the skin every 6 (six) months.  Administer in upper arm, thigh, or abdomen Unable to tolerate oral meds Dx severe osteoporosis 1.8 mL 6  . diclofenac sodium (VOLTAREN) 1 % GEL Apply topically.    . diphenhydrAMINE HCl (BENADRYL ALLERGY CHILDRENS PO) Take by mouth at bedtime as needed. Takes at night when itching    . erythromycin (ERY-TAB) 500 MG EC tablet Take 1 tablet (500 mg total) by mouth 3 (three) times daily. 30 tablet 0  . famotidine (PEPCID) 20 MG tablet Take 1 tablet (20 mg total) by mouth 2 (two) times daily. 180 tablet 3  . furosemide (LASIX) 20 MG tablet TAKE 1 TABLET (20 MG TOTAL) BY MOUTH DAILY AS NEEDED FOR EDEMA. 90 tablet 1  . gabapentin (NEURONTIN) 600 MG tablet TAKE 1/2 TABLET BY MOUTH 4 TIMES A DAY    . gentamicin ointment (GARAMYCIN) 0.1 % Apply 1 application topically 3 (three) times daily. 15 g 0  . hydrocortisone 2.5 % cream Apply 1 application topically 3 (three) times daily as needed (itching).    [name] ibuprofen (ADVIL) 200 MG tablet Take by mouth.    . levothyroxine (SYNTHROID) 25 MCG tablet Take 1 tablet (25 mcg total) by mouth daily before breakfast. 30 tablet 5  . montelukast (SINGULAIR) 10 MG tablet TAKE 1 TABLET BY MOUTH EVERY DAY 90 tablet 1  . mupirocin ointment (BACTROBAN) 2 % Apply once daily to ankle wound 22 g 0  . potassium chloride (K-DUR) 10 MEQ tablet TAKE 1 TABLET (10 MEQ TOTAL) BY MOUTH DAILY. TAKE WHILE ON FUROSEMIDE 90 tablet 1  . predniSONE (STERAPRED UNI-PAK 48 TAB) 5 MG (48) TBPK tablet See admin instructions. see package    . Rivaroxaban (XARELTO) 15 MG TABS tablet Take 15 mg by mouth daily.     . Simethicone (GAS-X PO) Take 1 tablet by mouth daily as needed (flatulence).     [name] sulfamethoxazole-trimethoprim (BACTRIM DS) 800-160 MG tablet TAKE 1 TABLET (160 MG OF TRIMETHOPRIM TOTAL) BY MOUTH ONCE DAILY FOR 14 DAYS    . TIROSINT-SOL 25 MCG/ML SOLN oral solution TAKE 1ML (25MCG) BY MOUTH ONCE DAILY    . tiZANidine (ZANAFLEX) 2 MG tablet Take 1 tablet (2 mg total) by mouth every 6  (six) hours as needed for muscle spasms. 30 tablet 0  . traMADol (ULTRAM) 50 MG tablet Take 1 tablet (50 mg total) by mouth every 6 (six) hours as needed. 30 tablet 0  . Wheat Dextrin (BENEFIBER PO) Take 1 tablet by mouth daily.     No current facility-administered medications on file prior to visit.     Allergies  Allergen Reactions  . Beef Extract Other (See Comments)    Alpha-gal allergy testing positive June 2018  . Galactose Nausea Only    Per patient, cannot have alpha-gal since tic bite episode.  . Pravastatin Anaphylaxis and Other (See Comments)    Legs ache  . Acetaminophen Other (See Comments)  . Amlodipine     Leg swelling  . Colchicine Other (See Comments)  . Doxycycline Other (See Comments)    Nausea - able to take w/food  .  Shellfish Allergy Other (See Comments)    Unknown  . Tikosyn [Dofetilide] Other (See Comments)    Unknown reaction and was told never to take it due to problems during sleep   . Actonel [Risedronate Sodium] Other (See Comments)    Not known  . Alendronate Other (See Comments)    NOT KNOWN  . Augmentin [Amoxicillin-Pot Clavulanate] Rash    Has patient had a PCN reaction causing immediate rash, facial/tongue/throat swelling, SOB or lightheadedness with hypotension: Yes Has patient had a PCN reaction causing severe rash involving mucus membranes or skin necrosis:No Has patient had a PCN reaction that required hospitalization: No Has patient had a PCN reaction occurring within the last 10 years: No If all of the above answers are "NO", then may proceed with Cephalosporin use.   . Ciprofloxacin Nausea Only  . Evista [Raloxifene] Other (See Comments)    Unknown reaction  . Flagyl [Metronidazole] Other (See Comments)    Not known  . Fosamax [Alendronate Sodium] Other (See Comments)    NOT KNOWN  . Gold-Containing Drug Products Other (See Comments)    Per Dr  . Loma Sousa [Escitalopram Oxalate] Other (See Comments)    shaky  . Risedronate Other  (See Comments)    Unknown reaction  . Simvastatin Other (See Comments)    REACTION: leg cramps, weakness  . Tramadol Other (See Comments)    Mental disturbance changes    Objective:  General: Alert and oriented x3 in no acute distress  Dermatology: Partial thickness dry scabbed over ulceration noted to the right lateral ankle at the lateral malleolus with mild localized erythema and localized swelling there is no increase in warmth no active drainage no malodor no other acute signs or symptoms of infection at this time like before.  No webspace macerations, no ecchymosis bilateral, all nails x 10 are short and thick but well manicured.  Vascular: Dorsalis Pedis and Posterior Tibial pedal pulses nonpalpable at this visit due to 1+ pitting edema bilateral.  Capillary Fill Time 5 seconds,(-) pedal hair growth bilateral, venous stasis skin changes noted bilateral, temperature gradient within normal limits.  Neurology: Johney Maine sensation intact via light touch bilateral.  Musculoskeletal: Mild tenderness with palpation at ulceration at right lateral ankle. Strength within normal limits in all groups bilateral patient status.   Assessment and Plan: Problem List Items Addressed This Visit    None    Visit Diagnoses    Venous ulcer of ankle, right (HCC)    -  Primary   Cellulitis of right ankle       Acute right ankle pain       PVD (peripheral vascular disease) (HCC)       Neuritis          -Complete examination performed -Re-Discussed treatement options for partial thickness ulceration venous in nature at the right lateral ankle that is venous in nature  -Production assistant, radio and advised patient to keep intact for 5 days  -Hold off on gentamycin until unna boot is removed  -Continue with PO erythromycin as Rx'd  -Advised patient to refrain from using shoes that could rub or irritate the area like before -Continue with lasix given by PCP for edema control and elevation -Patient to return to  office as scheduled for wound check or sooner if condition worsens.  Landis Martins, DPM

## 2019-04-12 ENCOUNTER — Encounter: Payer: Self-pay | Admitting: Internal Medicine

## 2019-04-12 ENCOUNTER — Ambulatory Visit (INDEPENDENT_AMBULATORY_CARE_PROVIDER_SITE_OTHER): Payer: Medicare Other | Admitting: Internal Medicine

## 2019-04-12 ENCOUNTER — Other Ambulatory Visit: Payer: Self-pay

## 2019-04-12 VITALS — BP 140/70 | HR 70 | Temp 98.3°F | Ht 60.0 in | Wt 123.0 lb

## 2019-04-12 DIAGNOSIS — K219 Gastro-esophageal reflux disease without esophagitis: Secondary | ICD-10-CM

## 2019-04-12 DIAGNOSIS — I872 Venous insufficiency (chronic) (peripheral): Secondary | ICD-10-CM | POA: Diagnosis not present

## 2019-04-12 DIAGNOSIS — Z23 Encounter for immunization: Secondary | ICD-10-CM

## 2019-04-12 DIAGNOSIS — L97311 Non-pressure chronic ulcer of right ankle limited to breakdown of skin: Secondary | ICD-10-CM

## 2019-04-12 DIAGNOSIS — F413 Other mixed anxiety disorders: Secondary | ICD-10-CM

## 2019-04-12 DIAGNOSIS — F419 Anxiety disorder, unspecified: Secondary | ICD-10-CM | POA: Insufficient documentation

## 2019-04-12 DIAGNOSIS — I4891 Unspecified atrial fibrillation: Secondary | ICD-10-CM | POA: Diagnosis not present

## 2019-04-12 MED ORDER — LORAZEPAM 0.5 MG PO TABS: 0.5000 mg | ORAL_TABLET | Freq: Two times a day (BID) | ORAL | 2 refills | Status: DC | PRN

## 2019-04-12 MED ORDER — COLLAGEN MATRIX FENEST (PORC) 2X2CM EX SHEE: 1.0000 [IU] | MEDICATED_PATCH | CUTANEOUS | 3 refills | Status: AC

## 2019-04-12 NOTE — Progress Notes (Signed)
Subjective:  Patient ID: Alyssa Luna, female    DOB: 1933-11-27  Age: 85 y.o. MRN: [id number]  CC: No chief complaint on file.   HPI Alyssa Luna presents for R ankle painful ulcer - pt went to [facility] C/o burping  C/o severe anxiety, stress...  Outpatient Medications Prior to Visit  Medication Sig Dispense Refill  . acetaminophen (TYLENOL) 325 MG tablet Take by mouth.    [name] amiodarone (PACERONE) 200 MG tablet Take 200 mg by mouth daily.    [name] amoxicillin (AMOXIL) 500 MG tablet TAKE 1 TABLET BY MOUTH EVERY 8 HOURS UNTIL FINISHED    . azithromycin (ZITHROMAX) 250 MG tablet TAKE 2 TABLETS BY MOUTH EVERY DAY FOR 3 DAYS    . cetirizine (ZYRTEC) 10 MG tablet Take 10 mg by mouth daily as needed for allergies.    . chlorhexidine (PERIDEX) 0.12 % solution PLEASE SEE ATTACHED FOR DETAILED DIRECTIONS    . Cholecalciferol (D3 DOTS) 2000 units TBDP Take by mouth daily.    . Cyanocobalamin (VITAMIN B-12 IJ) Inject as directed every 30 (thirty) days.    [name] denosumab (PROLIA) 60 MG/ML SOLN injection Inject 60 mg into the skin every 6 (six) months. Administer in upper arm, thigh, or abdomen Unable to tolerate oral meds Dx severe osteoporosis 1.8 mL 6  . diclofenac sodium (VOLTAREN) 1 % GEL Apply topically.    . diphenhydrAMINE HCl (BENADRYL ALLERGY CHILDRENS PO) Take by mouth at bedtime as needed. Takes at night when itching    . erythromycin (ERY-TAB) 500 MG EC tablet Take 1 tablet (500 mg total) by mouth 3 (three) times daily. 30 tablet 0  . famotidine (PEPCID) 20 MG tablet Take 1 tablet (20 mg total) by mouth 2 (two) times daily. 180 tablet 3  . furosemide (LASIX) 20 MG tablet TAKE 1 TABLET (20 MG TOTAL) BY MOUTH DAILY AS NEEDED FOR EDEMA. 90 tablet 1  . gabapentin (NEURONTIN) 600 MG tablet TAKE 1/2 TABLET BY MOUTH 4 TIMES A DAY    . gentamicin ointment (GARAMYCIN) 0.1 % Apply 1 application topically 3 (three) times daily. 15 g 0  . hydrocortisone 2.5 % cream Apply 1  application topically 3 (three) times daily as needed (itching).    [name] ibuprofen (ADVIL) 200 MG tablet Take by mouth.    . levothyroxine (SYNTHROID) 25 MCG tablet Take 1 tablet (25 mcg total) by mouth daily before breakfast. 30 tablet 5  . montelukast (SINGULAIR) 10 MG tablet TAKE 1 TABLET BY MOUTH EVERY DAY 90 tablet 1  . mupirocin ointment (BACTROBAN) 2 % Apply once daily to ankle wound 22 g 0  . potassium chloride (K-DUR) 10 MEQ tablet TAKE 1 TABLET (10 MEQ TOTAL) BY MOUTH DAILY. TAKE WHILE ON FUROSEMIDE 90 tablet 1  . predniSONE (STERAPRED UNI-PAK 48 TAB) 5 MG (48) TBPK tablet See admin instructions. see package    . Rivaroxaban (XARELTO) 15 MG TABS tablet Take 15 mg by mouth daily.     . Simethicone (GAS-X PO) Take 1 tablet by mouth daily as needed (flatulence).     [name] sulfamethoxazole-trimethoprim (BACTRIM DS) 800-160 MG tablet TAKE 1 TABLET (160 MG OF TRIMETHOPRIM TOTAL) BY MOUTH ONCE DAILY FOR 14 DAYS    . TIROSINT-SOL 25 MCG/ML SOLN oral solution TAKE 1ML (25MCG) BY MOUTH ONCE DAILY    . tiZANidine (ZANAFLEX) 2 MG tablet Take 1 tablet (2 mg total) by mouth every 6 (six) hours as needed for muscle spasms. 30 tablet 0  .  traMADol (ULTRAM) 50 MG tablet Take 1 tablet (50 mg total) by mouth every 6 (six) hours as needed. 30 tablet 0  . Wheat Dextrin (BENEFIBER PO) Take 1 tablet by mouth daily.     No facility-administered medications prior to visit.     ROS: Review of Systems  Constitutional: Positive for fatigue. Negative for activity change, appetite change, chills, fever and unexpected weight change.  HENT: Negative for congestion, mouth sores and sinus pressure.   Eyes: Negative for visual disturbance.  Respiratory: Negative for cough and chest tightness.   Gastrointestinal: Negative for abdominal pain, diarrhea, nausea and vomiting.  Genitourinary: Negative for difficulty urinating, frequency and vaginal pain.  Musculoskeletal: Positive for arthralgias, back pain, gait problem and  neck stiffness.  Skin: Positive for color change and wound. Negative for pallor and rash.  Neurological: Positive for weakness and numbness. Negative for dizziness, tremors and headaches.  Psychiatric/Behavioral: Positive for sleep disturbance. Negative for confusion, decreased concentration and suicidal ideas. The patient is nervous/anxious.     Objective:  BP 140/70 (BP Location: Left Arm, Patient Position: Sitting, Cuff Size: Normal)   Pulse 70   Temp 98.3 F (36.8 C) (Oral)   Ht 5' (1.524 m)   Wt 123 lb (55.8 kg)   SpO2 97%   BMI 24.02 kg/m   BP Readings from Last 3 Encounters:  04/12/19 140/70  04/04/19 130/61  03/23/19 120/70    Wt Readings from Last 3 Encounters:  04/12/19 123 lb (55.8 kg)  03/23/19 123 lb (55.8 kg)  03/09/19 129 lb (58.5 kg)    Physical Exam Constitutional:      General: She is not in acute distress.    Appearance: She is well-developed.  HENT:     Head: Normocephalic.     Right Ear: External ear normal.     Left Ear: External ear normal.     Nose: Nose normal.  Eyes:     General:        Right eye: No discharge.        Left eye: No discharge.     Conjunctiva/sclera: Conjunctivae normal.     Pupils: Pupils are equal, round, and reactive to light.  Neck:     Musculoskeletal: Normal range of motion and neck supple.     Thyroid: No thyromegaly.     Vascular: No JVD.     Trachea: No tracheal deviation.  Cardiovascular:     Rate and Rhythm: Normal rate and regular rhythm.     Heart sounds: Normal heart sounds.  Pulmonary:     Effort: No respiratory distress.     Breath sounds: No stridor. No wheezing.  Abdominal:     General: Bowel sounds are normal. There is no distension.     Palpations: Abdomen is soft. There is no mass.     Tenderness: There is no abdominal tenderness. There is no guarding or rebound.  Musculoskeletal:        General: Tenderness present.  Lymphadenopathy:     Cervical: No cervical adenopathy.  Skin:    General:  Skin is dry.     Findings: Lesion present. No erythema or rash.  Neurological:     Mental Status: She is oriented to person, place, and time.     Cranial Nerves: No cranial nerve deficit.     Motor: No abnormal muscle tone.     Coordination: Coordination normal.     Gait: Gait abnormal.     Deep Tendon Reflexes: Reflexes normal.  Psychiatric:  Behavior: Behavior normal.        Thought Content: Thought content normal.        Judgment: Judgment normal.    Ulcer R ankle lat <1 cm Walker   Lab Results  Component Value Date   WBC 4.0 02/07/2019   HGB 11.5 (L) 02/07/2019   HCT 37.5 02/07/2019   PLT 380 02/07/2019   GLUCOSE 98 12/20/2018   CHOL 160 02/25/2018   TRIG 119.0 02/25/2018   HDL 42.90 02/25/2018   LDLDIRECT 178.0 09/24/2011   LDLCALC 94 02/25/2018   ALT 56 (H) 12/20/2018   AST 82 (H) 12/20/2018   NA 140 12/20/2018   K 4.5 12/20/2018   CL 107 12/20/2018   CREATININE 0.97 12/20/2018   BUN 14 12/20/2018   CO2 26 12/20/2018   TSH 11.91 (H) 12/20/2018   INR 1.3 (H) 02/07/2019    Ir Kypho Lumbar Inc Fx Reduce Bone Bx Uni/bil Cannulation Inc/imaging  Result Date: 02/08/2019 INDICATION: Severe low back pain secondary to compression fractures at T10 and at L2. EXAM: BALLOON KYPHOPLASTY AT T10 AND AT L2. COMPARISON:  MRI MRA of the brain of a few weeks ago. MEDICATIONS: As antibiotic prophylaxis, Ancef 2 g IV was ordered pre-procedure and administered intravenously within 1 hour of incision. ANESTHESIA/SEDATION: Moderate (conscious) sedation was employed during this procedure. A total of Versed 2.5 mg and Fentanyl 75 mcg was administered intravenously. Moderate Sedation Time: 54 minutes. The patient's level of consciousness and vital signs were monitored continuously by radiology nursing throughout the procedure under my direct supervision. FLUOROSCOPY TIME:  Fluoroscopy Time: 21 minutes 6 seconds (8,315 mGy) COMPLICATIONS: None immediate. PROCEDURE: Following a full  explanation of the procedure along with the potential associated complications, an informed witnessed consent was obtained. The patient was placed prone on the fluoroscopic table. The skin overlying the thoracolumbar region was then prepped and draped in the usual sterile fashion. The right pedicle at T10, and both pedicles at L2 were then infiltrated with 0.25% bupivacaine, followed by the advancement of an 11-gauge Jamshidi needle through the right pedicle of T10, and both pedicles at L2 into the posterior one-third at these levels. These were then exchanged for a Kyphon advanced osteo introducer system comprised of a working cannula and a Kyphon osteo drill in the 3 pedicles. The combinations were then advanced over a Kyphon osteo bone pin until the tip of the Kyphon osteo drill was in the posterior third at T10 and at L2. At this time, the bone pins were removed. In a medial trajectory, the combination was advanced until the tip of the working cannula was inside the posterior one-third at T10, and at L2. The osteo drills were removed and a core samples sent for pathologic analysis. Through each working cannulae, a Kyphon bone biopsy device was advanced to within 5 mm of the anterior aspect of T10 and L2. Core samples from these were also sent for pathologic analysis. Through the working cannulae, a Kyphon inflatable bone tamp 20 x 3 was advanced and positioned with the distal marker 5 mm from the anterior aspect of T10 and L2. Crossing of the midline was seen on the AP projection. At this time, the balloons were expanded using contrast via a Kyphon inflation syringe device via microtubing. Inflations were continued until there was apposition with the superior and the inferior endplates into L2, and the inferior endplate at V76. At this time, methylmethacrylate mixture was reconstituted with Tobramycin in the Kyphon bone mixing device system. This was  then loaded onto the Kyphon bone fillers. The balloons were  deflated and removed followed by the instillation of 4-1/2 bone filler equivalents of methylmethacrylate mixture at L2, and 2 bone filler equivalent of microcatheter mixture at T10 with excellent filling in the AP and lateral projections. No extravasation was noted in the disk spaces or posteriorly into the spinal canal. No epidural venous contamination was seen. The working cannulae and the bone fillers were then retrieved and removed. Hemostasis was achieved at the skin entry sites. The patient tolerated the procedure well. IMPRESSION: 1. Status post fluoroscopic-guided needle placement for deep core bone biopsy at T10 and L2. 2. Status post vertebral body augmentation using balloon kyphoplasty at T10 and L2 as described without event. 3. Patient advised to check with her referring MD for results of the biopsy obtained as per the request of her referring MD. Electronically Signed   By: Luanne Bras M.D.   On: 02/07/2019 14:46   Ir Kypho Ea Addl Level Thoracic Or Lumbar  Result Date: 02/08/2019 INDICATION: Severe low back pain secondary to compression fractures at T10 and at L2. EXAM: BALLOON KYPHOPLASTY AT T10 AND AT L2. COMPARISON:  MRI MRA of the brain of a few weeks ago. MEDICATIONS: As antibiotic prophylaxis, Ancef 2 g IV was ordered pre-procedure and administered intravenously within 1 hour of incision. ANESTHESIA/SEDATION: Moderate (conscious) sedation was employed during this procedure. A total of Versed 2.5 mg and Fentanyl 75 mcg was administered intravenously. Moderate Sedation Time: 54 minutes. The patient's level of consciousness and vital signs were monitored continuously by radiology nursing throughout the procedure under my direct supervision. FLUOROSCOPY TIME:  Fluoroscopy Time: 21 minutes 6 seconds (9,379 mGy) COMPLICATIONS: None immediate. PROCEDURE: Following a full explanation of the procedure along with the potential associated complications, an informed witnessed consent was  obtained. The patient was placed prone on the fluoroscopic table. The skin overlying the thoracolumbar region was then prepped and draped in the usual sterile fashion. The right pedicle at T10, and both pedicles at L2 were then infiltrated with 0.25% bupivacaine, followed by the advancement of an 11-gauge Jamshidi needle through the right pedicle of T10, and both pedicles at L2 into the posterior one-third at these levels. These were then exchanged for a Kyphon advanced osteo introducer system comprised of a working cannula and a Kyphon osteo drill in the 3 pedicles. The combinations were then advanced over a Kyphon osteo bone pin until the tip of the Kyphon osteo drill was in the posterior third at T10 and at L2. At this time, the bone pins were removed. In a medial trajectory, the combination was advanced until the tip of the working cannula was inside the posterior one-third at T10, and at L2. The osteo drills were removed and a core samples sent for pathologic analysis. Through each working cannulae, a Kyphon bone biopsy device was advanced to within 5 mm of the anterior aspect of T10 and L2. Core samples from these were also sent for pathologic analysis. Through the working cannulae, a Kyphon inflatable bone tamp 20 x 3 was advanced and positioned with the distal marker 5 mm from the anterior aspect of T10 and L2. Crossing of the midline was seen on the AP projection. At this time, the balloons were expanded using contrast via a Kyphon inflation syringe device via microtubing. Inflations were continued until there was apposition with the superior and the inferior endplates into L2, and the inferior endplate at K24. At this time, methylmethacrylate mixture was reconstituted  with Tobramycin in the Kyphon bone mixing device system. This was then loaded onto the Kyphon bone fillers. The balloons were deflated and removed followed by the instillation of 4-1/2 bone filler equivalents of methylmethacrylate mixture at  L2, and 2 bone filler equivalent of microcatheter mixture at T10 with excellent filling in the AP and lateral projections. No extravasation was noted in the disk spaces or posteriorly into the spinal canal. No epidural venous contamination was seen. The working cannulae and the bone fillers were then retrieved and removed. Hemostasis was achieved at the skin entry sites. The patient tolerated the procedure well. IMPRESSION: 1. Status post fluoroscopic-guided needle placement for deep core bone biopsy at T10 and L2. 2. Status post vertebral body augmentation using balloon kyphoplasty at T10 and L2 as described without event. 3. Patient advised to check with her referring MD for results of the biopsy obtained as per the request of her referring MD. Electronically Signed   By: Luanne Bras M.D.   On: 02/07/2019 14:46    Assessment & Plan:   Diagnoses and all orders for this visit:  Need for influenza vaccination -     Flu Vaccine QUAD High Dose(Fluad)     No orders of the defined types were placed in this encounter.    Follow-up: No follow-ups on file.  Walker Kehr, MD

## 2019-04-12 NOTE — Assessment & Plan Note (Signed)
C/o burping - eat smaller meals more frequent Cont w/Pepcid

## 2019-04-12 NOTE — Patient Instructions (Signed)
Stasis Dermatitis Stasis dermatitis is a long-term (chronic) skin condition that happens when veins can no longer pump blood back to the heart (poor circulation). This condition causes a red or brown scaly rash or sores (ulcers) from the pooling of blood (stasis). This condition usually affects the lower legs. It may affect one leg or both legs. Without treatment, severe stasis dermatitis can lead to other skin conditions and infections. What are the causes? This condition is caused by poor circulation. What increases the risk? You are more likely to develop this condition if:  You are not very active.  You stand for long periods of time.  You have veins that have become enlarged and twisted (varicose veins).  You have leg veins that are not strong enough to send blood back to the heart (venous insufficiency).  You have had a blood clot.  You have been pregnant many times.  You have had vein surgery.  You are obese.  You have heart or kidney failure.  You are 83 years of age or older.  You have had injuries to your legs in the past. What are the signs or symptoms? Common early symptoms of this condition include:  Itchiness in one or both of your legs.  Swelling in your ankle or leg. This might get better overnight but be worse again during the day.  Skin that looks thin on your ankle and leg.  Red or brown marks that develop slowly.  Skin that is dry, cracked, or easily irritated.  Red, swollen skin that is sore or has a burning feeling.  An achy or heavy feeling after you walk or stand for long periods of time.  Pain. Later and more severe symptoms of this condition include:  Skin that looks shiny.  Small, open sores (ulcers). These are often red or purple and leak fluid.  Skin that feels hard.  Severe itching.  A change in the shape or color of your lower legs.  Severe pain.  Difficulty walking. How is this diagnosed? This condition may be diagnosed  based on:  Your symptoms and medical history.  A physical exam. You may also have tests, including:  Blood tests.  Imaging tests to check blood flow (Doppler ultrasound).  Allergy tests. You may need to see a health care provider who specializes in skin diseases (dermatologist). How is this treated? This condition may be treated with:  Compression stockings or an elastic wrap to improve circulation.  Medicines, such as: ? Corticosteroid creams and ointments. ? Non-corticosteroid medicines applied to the skin (topical). ? Medicine to reduce swelling in the legs (diuretics). ? Antibiotics. ? Medicine to relieve itching (antihistamines).  A bandage (dressing).  A wrap that contains zinc and gelatin (Unna boot). Follow these instructions at home: Skin care  Moisturize your skin as told by your health care provider. Do not use moisturizers with fragrance. This can irritate your skin.  Apply a cool, wet cloth (cool compress) to the affected areas.  Do not scratch your skin.  Do not rub your skin dry after a bath or shower. Gently pat your skin dry.  Do not use scented soaps, detergents, or perfumes. Medicines  Take or use over-the-counter and prescription medicines only as told by your health care provider.  If you were prescribed an antibiotic medicine, take or use it as told by your health care provider. Do not stop taking or using the antibiotic even if your condition improves. Activity  Walk as told by your health care  provider. Walking increases blood flow.  Do calf and ankle exercises throughout the day as told by your health care provider. This will help increase blood flow.  Raise (elevate) your legs above the level of your heart when you are sitting or lying down. Lifestyle  Work with your health care provider to lose weight, if needed.  Do not cross your legs when you sit.  Do not stand or sit in one position for long periods of time.  Wear comfortable,  loose-fitting clothing. Circulation in your legs will be worse if you wear tight pants, belts, and waistbands.  Do not use any products that contain nicotine or tobacco, such as cigarettes, e-cigarettes, and chewing tobacco. If you need help quitting, ask your health care provider. General instructions  If you were asked to use one of the following to help with your condition, follow instructions from your health care provider on how to: ? Remove and change any dressing. ? Wear compression stockings. These stockings help to prevent blood clots and reduce swelling in your legs. ? Wear the [employer].  Keep all follow-up visits as told by your health care provider. This is important. Contact a health care provider if:  Your condition does not improve with treatment.  Your condition gets worse.  You have signs of infection in the affected area. Watch for: ? Swelling. ? Tenderness. ? Redness. ? Soreness. ? Warmth.  You have a fever. Get help right away if:  You notice red streaks coming from the affected area.  Your bone or joint underneath the affected area becomes painful after the skin has healed.  The affected area turns darker.  You feel a deep pain in your leg or groin.  You are short of breath. Summary  Stasis dermatitis is a long-term (chronic) skin condition that happens when veins can no longer pump blood back to the heart (poor circulation).  Wear compression stockings as told by your health care provider. These stockings help to prevent blood clots and reduce swelling in your legs.  Follow instructions from your health care provider about activity, medicines, and lifestyle.  Contact a health care provider if you have a fever or have signs of infection in the affected area.  Keep all follow-up visits as told by your health care provider. This is important. This information is not intended to replace advice given to you by your health care provider. Make sure you  discuss any questions you have with your health care provider. Document Released: 11/12/2005 Document Revised: 01/03/2018 Document Reviewed: 01/03/2018 Elsevier Patient Education  2020 [employer].

## 2019-04-12 NOTE — Assessment & Plan Note (Signed)
On Xarelto Pacerone

## 2019-04-12 NOTE — Assessment & Plan Note (Signed)
Worse Lorazepam prn  Potential benefits of a long term benzodiazepines  use as well as potential risks  and complications were explained to the patient and were aknowledged.

## 2019-04-12 NOTE — Assessment & Plan Note (Signed)
Gel pad q 1-4 d Try to use/put on compression socks.  Elevate legs W[facility] ref

## 2019-04-14 ENCOUNTER — Ambulatory Visit: Payer: Medicare Other | Admitting: Sports Medicine

## 2019-04-17 ENCOUNTER — Other Ambulatory Visit: Payer: Self-pay | Admitting: Sports Medicine

## 2019-04-17 DIAGNOSIS — I83013 Varicose veins of right lower extremity with ulcer of ankle: Secondary | ICD-10-CM

## 2019-04-17 DIAGNOSIS — M25571 Pain in right ankle and joints of right foot: Secondary | ICD-10-CM

## 2019-04-17 DIAGNOSIS — L03115 Cellulitis of right lower limb: Secondary | ICD-10-CM

## 2019-04-26 ENCOUNTER — Encounter: Payer: Self-pay | Admitting: Internal Medicine

## 2019-04-26 ENCOUNTER — Other Ambulatory Visit (INDEPENDENT_AMBULATORY_CARE_PROVIDER_SITE_OTHER): Payer: Medicare Other

## 2019-04-26 ENCOUNTER — Ambulatory Visit (INDEPENDENT_AMBULATORY_CARE_PROVIDER_SITE_OTHER): Payer: Medicare Other | Admitting: Internal Medicine

## 2019-04-26 ENCOUNTER — Other Ambulatory Visit: Payer: Self-pay

## 2019-04-26 VITALS — BP 126/72 | HR 60 | Temp 98.0°F | Ht 60.0 in | Wt 126.0 lb

## 2019-04-26 DIAGNOSIS — Z022 Encounter for examination for admission to residential institution: Secondary | ICD-10-CM | POA: Diagnosis not present

## 2019-04-26 DIAGNOSIS — E538 Deficiency of other specified B group vitamins: Secondary | ICD-10-CM

## 2019-04-26 DIAGNOSIS — I10 Essential (primary) hypertension: Secondary | ICD-10-CM

## 2019-04-26 DIAGNOSIS — G250 Essential tremor: Secondary | ICD-10-CM

## 2019-04-26 DIAGNOSIS — I872 Venous insufficiency (chronic) (peripheral): Secondary | ICD-10-CM | POA: Diagnosis not present

## 2019-04-26 DIAGNOSIS — Z0000 Encounter for general adult medical examination without abnormal findings: Secondary | ICD-10-CM | POA: Diagnosis not present

## 2019-04-26 DIAGNOSIS — R195 Other fecal abnormalities: Secondary | ICD-10-CM

## 2019-04-26 DIAGNOSIS — Z91018 Allergy to other foods: Secondary | ICD-10-CM

## 2019-04-26 LAB — CBC
HCT: 32.2 % — ABNORMAL LOW (ref 36.0–46.0)
Hemoglobin: 10.2 g/dL — ABNORMAL LOW (ref 12.0–15.0)
MCHC: 31.7 g/dL (ref 30.0–36.0)
MCV: 81.3 fl (ref 78.0–100.0)
Platelets: 208 10*3/uL (ref 150.0–400.0)
RBC: 3.96 Mil/uL (ref 3.87–5.11)
RDW: 15.9 % — ABNORMAL HIGH (ref 11.5–15.5)
WBC: 3.5 10*3/uL — ABNORMAL LOW (ref 4.0–10.5)

## 2019-04-26 LAB — COMPREHENSIVE METABOLIC PANEL
ALT: 44 U/L — ABNORMAL HIGH (ref 0–35)
AST: 64 U/L — ABNORMAL HIGH (ref 0–37)
Albumin: 3.6 g/dL (ref 3.5–5.2)
Alkaline Phosphatase: 62 U/L (ref 39–117)
BUN: 15 mg/dL (ref 6–23)
CO2: 29 mEq/L (ref 19–32)
Calcium: 8.8 mg/dL (ref 8.4–10.5)
Chloride: 105 mEq/L (ref 96–112)
Creatinine, Ser: 0.76 mg/dL (ref 0.40–1.20)
GFR: 72.57 mL/min (ref >60.00)
Glucose, Bld: 74 mg/dL (ref 70–99)
Potassium: 4.3 mEq/L (ref 3.5–5.1)
Sodium: 140 mEq/L (ref 135–145)
Total Bilirubin: 0.6 mg/dL (ref 0.2–1.2)
Total Protein: 6.6 g/dL (ref 6.0–8.3)

## 2019-04-26 LAB — URINALYSIS
Bilirubin Urine: NEGATIVE
Ketones, ur: NEGATIVE
Leukocytes,Ua: NEGATIVE
Nitrite: NEGATIVE
Specific Gravity, Urine: 1.015 (ref 1.000–1.030)
Total Protein, Urine: NEGATIVE
Urine Glucose: NEGATIVE
Urobilinogen, UA: 0.2 (ref 0.0–1.0)
pH: 6.5 (ref 5.0–8.0)

## 2019-04-26 NOTE — Assessment & Plan Note (Signed)
We discussed age appropriate health related issues, including available/recomended screening tests and vaccinations. We discussed a need for adhering to healthy diet and exercise. Labs were ordered to be later reviewed . All questions were answered.   
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
## 2019-04-28 ENCOUNTER — Ambulatory Visit: Payer: Medicare Other | Admitting: Specialist

## 2019-04-28 LAB — QUANTIFERON-TB GOLD PLUS
Mitogen-NIL: >10 IU/mL
NIL: 0.05 IU/mL
QuantiFERON-TB Gold Plus: NEGATIVE
TB1-NIL: 0.01 IU/mL
TB2-NIL: 0.02 IU/mL

## 2019-05-01 ENCOUNTER — Ambulatory Visit (INDEPENDENT_AMBULATORY_CARE_PROVIDER_SITE_OTHER): Payer: Medicare Other | Admitting: Specialist

## 2019-05-01 ENCOUNTER — Encounter: Payer: Self-pay | Admitting: Specialist

## 2019-05-01 VITALS — BP 148/64 | HR 57 | Ht 60.0 in | Wt 126.0 lb

## 2019-05-01 DIAGNOSIS — M1611 Unilateral primary osteoarthritis, right hip: Secondary | ICD-10-CM

## 2019-05-01 DIAGNOSIS — M4726 Other spondylosis with radiculopathy, lumbar region: Secondary | ICD-10-CM | POA: Diagnosis not present

## 2019-05-01 DIAGNOSIS — M5136 Other intervertebral disc degeneration, lumbar region: Secondary | ICD-10-CM

## 2019-05-01 DIAGNOSIS — M4125 Other idiopathic scoliosis, thoracolumbar region: Secondary | ICD-10-CM

## 2019-05-01 DIAGNOSIS — Z7901 Long term (current) use of anticoagulants: Secondary | ICD-10-CM

## 2019-05-01 NOTE — Patient Instructions (Addendum)
Avoid frequent bending and stooping  No lifting greater than 10 lbs. May use ice or moist heat for pain. Hemp CBD oil capsules on amazon.com, 5,000 mg to 7,000 mg per bottle, 60 capsules per bottle take one capsule twice a day for pain. Handicap license is approved. You may decrease your use of the gabapentin one tablet per week.  An option to helping to relieve your pain is to consider facet blocks to be done in the lowe lumbar spine to determine if The facets are the source of pain and if this is the cause a more long term solution would be to perform radiofrequency ablation of the nerves to the facets to denervate the joints and relieve the pain.
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
## 2019-05-02 NOTE — Assessment & Plan Note (Signed)
BP Readings from Last 3 Encounters:  05/01/19 (!) 148/64  04/26/19 126/72  04/12/19 140/70

## 2019-05-02 NOTE — Assessment & Plan Note (Signed)
a [facility] tick bite x several times and seeing Dr Nicki Reaper Commins at [facility]. Her blood test IgE (+) beef, milk. Avoidance diet

## 2019-05-02 NOTE — Assessment & Plan Note (Signed)
F/u at [facility]

## 2019-05-02 NOTE — Assessment & Plan Note (Signed)
On B12 inj q 1 month

## 2019-05-02 NOTE — Assessment & Plan Note (Signed)
Better Support hose

## 2019-05-04 ENCOUNTER — Ambulatory Visit: Payer: Medicare Other | Admitting: Internal Medicine

## 2019-05-08 DIAGNOSIS — Z0279 Encounter for issue of other medical certificate: Secondary | ICD-10-CM

## 2019-05-18 ENCOUNTER — Ambulatory Visit (INDEPENDENT_AMBULATORY_CARE_PROVIDER_SITE_OTHER): Payer: Medicare Other

## 2019-05-18 DIAGNOSIS — E538 Deficiency of other specified B group vitamins: Secondary | ICD-10-CM

## 2019-05-18 MED ORDER — CYANOCOBALAMIN 1000 MCG/ML IJ SOLN
1000.0000 ug | Freq: Once | INTRAMUSCULAR | Status: AC
  Administered 2019-05-18: 14:00:00 1000 ug via INTRAMUSCULAR

## 2019-05-23 ENCOUNTER — Ambulatory Visit: Payer: Medicare Other | Admitting: Podiatry

## 2019-05-26 NOTE — Progress Notes (Signed)
Medical screening examination/treatment/procedure(s) were performed by non-physician practitioner and as supervising physician I was immediately available for consultation/collaboration. I agree with above. Lew Dawes, MD

## 2019-06-11 ENCOUNTER — Other Ambulatory Visit: Payer: Self-pay | Admitting: Internal Medicine

## 2019-06-20 ENCOUNTER — Encounter: Payer: Self-pay | Admitting: Podiatry

## 2019-06-20 ENCOUNTER — Other Ambulatory Visit: Payer: Self-pay

## 2019-06-20 ENCOUNTER — Ambulatory Visit: Payer: Medicare Other | Admitting: Podiatry

## 2019-06-20 DIAGNOSIS — Z9229 Personal history of other drug therapy: Secondary | ICD-10-CM | POA: Diagnosis not present

## 2019-06-20 DIAGNOSIS — M79609 Pain in unspecified limb: Secondary | ICD-10-CM

## 2019-06-20 DIAGNOSIS — B351 Tinea unguium: Secondary | ICD-10-CM

## 2019-06-20 DIAGNOSIS — L84 Corns and callosities: Secondary | ICD-10-CM | POA: Diagnosis not present

## 2019-06-21 ENCOUNTER — Ambulatory Visit (INDEPENDENT_AMBULATORY_CARE_PROVIDER_SITE_OTHER): Payer: Medicare Other | Admitting: Internal Medicine

## 2019-06-21 ENCOUNTER — Encounter: Payer: Self-pay | Admitting: Internal Medicine

## 2019-06-21 DIAGNOSIS — K146 Glossodynia: Secondary | ICD-10-CM | POA: Diagnosis not present

## 2019-06-21 DIAGNOSIS — K219 Gastro-esophageal reflux disease without esophagitis: Secondary | ICD-10-CM

## 2019-06-21 DIAGNOSIS — R490 Dysphonia: Secondary | ICD-10-CM

## 2019-06-21 DIAGNOSIS — E538 Deficiency of other specified B group vitamins: Secondary | ICD-10-CM

## 2019-06-21 MED ORDER — PANTOPRAZOLE SODIUM 40 MG PO TBEC: 40.0000 mg | DELAYED_RELEASE_TABLET | Freq: Every day | ORAL | 11 refills | Status: DC

## 2019-06-21 NOTE — Assessment & Plan Note (Signed)
Use baking soda in water solution to rinse your mouth or try [employer] Peroxic[employer] tooth paste again Treat GERD ENT ref Add Protonix

## 2019-06-21 NOTE — Assessment & Plan Note (Signed)
On B12 

## 2019-06-21 NOTE — Assessment & Plan Note (Signed)
Use baking soda in water solution to rinse your mouth or try [employer] Peroxic[employer] tooth paste again Treat GERD ENT ref

## 2019-06-21 NOTE — Progress Notes (Signed)
Subjective:  Patient ID: Alyssa Luna, female    DOB: [birth year]  Age: 85 y.o. MRN: [id number]  CC: No chief complaint on file.   HPI Alyssa Luna presents for GERD on Famotidine, hoarseness. Pt saw Dr Hilarie Fredrickson in the past. C/o fatigue Alyssa Luna is moving to [facility]) in [place] in November  Outpatient Medications Prior to Visit  Medication Sig Dispense Refill   acetaminophen (TYLENOL) 325 MG tablet Take by mouth.     amiodarone (PACERONE) 200 MG tablet Take 200 mg by mouth daily.     amoxicillin (AMOXIL) 500 MG tablet TAKE 1 TABLET BY MOUTH EVERY 8 HOURS UNTIL FINISHED     azithromycin (ZITHROMAX) 250 MG tablet TAKE 2 TABLETS BY MOUTH EVERY DAY FOR 3 DAYS     cetirizine (ZYRTEC) 10 MG tablet Take 10 mg by mouth daily as needed for allergies.     chlorhexidine (PERIDEX) 0.12 % solution PLEASE SEE ATTACHED FOR DETAILED DIRECTIONS     Cholecalciferol (D3 DOTS) 2000 units TBDP Take by mouth daily.     Cyanocobalamin (VITAMIN B-12 IJ) Inject as directed every 30 (thirty) days.     denosumab (PROLIA) 60 MG/ML SOLN injection Inject 60 mg into the skin every 6 (six) months. Administer in upper arm, thigh, or abdomen Unable to tolerate oral meds Dx severe osteoporosis 1.8 mL 6   diclofenac sodium (VOLTAREN) 1 % GEL Apply topically.     diphenhydrAMINE HCl (BENADRYL ALLERGY CHILDRENS PO) Take by mouth at bedtime as needed. Takes at night when itching     erythromycin (ERY-TAB) 500 MG EC tablet Take 1 tablet (500 mg total) by mouth 3 (three) times daily. 30 tablet 0   famotidine (PEPCID) 20 MG tablet Take 1 tablet (20 mg total) by mouth 2 (two) times daily. 180 tablet 3   furosemide (LASIX) 20 MG tablet TAKE 1 TABLET (20 MG TOTAL) BY MOUTH DAILY AS NEEDED FOR EDEMA. 90 tablet 1   gabapentin (NEURONTIN) 600 MG tablet TAKE 1/2 TABLET BY MOUTH 4 TIMES A DAY     gentamicin ointment (GARAMYCIN) 0.1 % Apply 1 application topically 3 (three) times daily. 15  g 0   hydrocortisone 2.5 % cream Apply 1 application topically 3 (three) times daily as needed (itching).     ibuprofen (ADVIL) 200 MG tablet Take by mouth.     levothyroxine (SYNTHROID) 25 MCG tablet Take 1 tablet (25 mcg total) by mouth daily before breakfast. 90 tablet 1   LORazepam (ATIVAN) 0.5 MG tablet Take 1-2 tablets (0.5-1 mg total) by mouth 2 (two) times daily as needed for anxiety or sleep. 60 tablet 2   montelukast (SINGULAIR) 10 MG tablet TAKE 1 TABLET BY MOUTH EVERY DAY 90 tablet 1   mupirocin ointment (BACTROBAN) 2 % Apply once daily to ankle wound 22 g 0   potassium chloride (K-DUR) 10 MEQ tablet TAKE 1 TABLET (10 MEQ TOTAL) BY MOUTH DAILY. TAKE WHILE ON FUROSEMIDE 90 tablet 1   predniSONE (STERAPRED UNI-PAK 48 TAB) 5 MG (48) TBPK tablet See admin instructions. see package     Rivaroxaban (XARELTO) 15 MG TABS tablet Take 15 mg by mouth daily.      Simethicone (GAS-X PO) Take 1 tablet by mouth daily as needed (flatulence).      sulfamethoxazole-trimethoprim (BACTRIM DS) 800-160 MG tablet TAKE 1 TABLET (160 MG OF TRIMETHOPRIM TOTAL) BY MOUTH ONCE DAILY FOR 14 DAYS     tiZANidine (ZANAFLEX) 2 MG tablet Take 1 tablet (2  mg total) by mouth every 6 (six) hours as needed for muscle spasms. 30 tablet 0   traMADol (ULTRAM) 50 MG tablet Take 1 tablet (50 mg total) by mouth every 6 (six) hours as needed. 30 tablet 0   Wheat Dextrin (BENEFIBER PO) Take 1 tablet by mouth daily.     No facility-administered medications prior to visit.     ROS: Review of Systems  Constitutional: Positive for fatigue. Negative for activity change, appetite change, chills and unexpected weight change.  HENT: Positive for voice change. Negative for congestion, mouth sores, sinus pressure and sore throat.   Eyes: Negative for visual disturbance.  Respiratory: Negative for cough, chest tightness and shortness of breath.   Cardiovascular: Negative for chest pain.  Gastrointestinal: Negative for  abdominal pain and nausea.  Genitourinary: Negative for difficulty urinating, frequency and vaginal pain.  Musculoskeletal: Positive for arthralgias, back pain and gait problem.  Skin: Negative for pallor and rash.  Neurological: Positive for weakness. Negative for dizziness, tremors, numbness and headaches.  Psychiatric/Behavioral: Negative for confusion, sleep disturbance and suicidal ideas. The patient is not nervous/anxious.     Objective:  BP 130/80 (BP Location: Left Arm, Patient Position: Sitting, Cuff Size: Normal)    Pulse 68    Temp 98.2 F (36.8 C) (Oral)    Ht 5' (1.524 m)    Wt 130 lb (59 kg)    SpO2 95%    BMI 25.39 kg/m   BP Readings from Last 3 Encounters:  06/21/19 130/80  05/01/19 (!) 148/64  04/26/19 126/72    Wt Readings from Last 3 Encounters:  06/21/19 130 lb (59 kg)  05/01/19 126 lb (57.2 kg)  04/26/19 126 lb (57.2 kg)    Physical Exam Constitutional:      General: She is not in acute distress.    Appearance: She is well-developed.  HENT:     Head: Normocephalic.     Right Ear: External ear normal.     Left Ear: External ear normal.     Nose: Nose normal.  Eyes:     General:        Right eye: No discharge.        Left eye: No discharge.     Conjunctiva/sclera: Conjunctivae normal.     Pupils: Pupils are equal, round, and reactive to light.  Neck:     Musculoskeletal: Normal range of motion and neck supple.     Thyroid: No thyromegaly.     Vascular: No JVD.     Trachea: No tracheal deviation.  Cardiovascular:     Rate and Rhythm: Normal rate and regular rhythm.     Heart sounds: Normal heart sounds.  Pulmonary:     Effort: No respiratory distress.     Breath sounds: No stridor. No wheezing.  Abdominal:     General: Bowel sounds are normal. There is no distension.     Palpations: Abdomen is soft. There is no mass.     Tenderness: There is no abdominal tenderness. There is no guarding or rebound.  Musculoskeletal:        General: No  tenderness.  Lymphadenopathy:     Cervical: No cervical adenopathy.  Skin:    Findings: No erythema or rash.  Neurological:     Cranial Nerves: No cranial nerve deficit.     Motor: No abnormal muscle tone.     Coordination: Coordination normal.     Deep Tendon Reflexes: Reflexes normal.  Psychiatric:  Behavior: Behavior normal.        Thought Content: Thought content normal.        Judgment: Judgment normal.   tongue is a little "raw" Not hoarse  Lab Results  Component Value Date   WBC 3.5 (L) 04/26/2019   HGB 10.2 (L) 04/26/2019   HCT 32.2 (L) 04/26/2019   PLT 208.0 04/26/2019   GLUCOSE 74 04/26/2019   CHOL 160 02/25/2018   TRIG 119.0 02/25/2018   HDL 42.90 02/25/2018   LDLDIRECT 178.0 09/24/2011   LDLCALC 94 02/25/2018   ALT 44 (H) 04/26/2019   AST 64 (H) 04/26/2019   NA 140 04/26/2019   K 4.3 04/26/2019   CL 105 04/26/2019   CREATININE 0.76 04/26/2019   BUN 15 04/26/2019   CO2 29 04/26/2019   TSH 11.91 (H) 12/20/2018   INR 1.3 (H) 02/07/2019    Ir Kypho Lumbar Inc Fx Reduce Bone Bx Uni/bil Cannulation Inc/imaging  Result Date: 02/08/2019 INDICATION: Severe low back pain secondary to compression fractures at T10 and at L2. EXAM: BALLOON KYPHOPLASTY AT T10 AND AT L2. COMPARISON:  MRI MRA of the brain of a few weeks ago. MEDICATIONS: As antibiotic prophylaxis, Ancef 2 g IV was ordered pre-procedure and administered intravenously within 1 hour of incision. ANESTHESIA/SEDATION: Moderate (conscious) sedation was employed during this procedure. A total of Versed 2.5 mg and Fentanyl 75 mcg was administered intravenously. Moderate Sedation Time: 54 minutes. The patient's level of consciousness and vital signs were monitored continuously by radiology nursing throughout the procedure under my direct supervision. FLUOROSCOPY TIME:  Fluoroscopy Time: 21 minutes 6 seconds (0,865 mGy) COMPLICATIONS: None immediate. PROCEDURE: Following a full explanation of the procedure along  with the potential associated complications, an informed witnessed consent was obtained. The patient was placed prone on the fluoroscopic table. The skin overlying the thoracolumbar region was then prepped and draped in the usual sterile fashion. The right pedicle at T10, and both pedicles at L2 were then infiltrated with 0.25% bupivacaine, followed by the advancement of an 11-gauge Jamshidi needle through the right pedicle of T10, and both pedicles at L2 into the posterior one-third at these levels. These were then exchanged for a Kyphon advanced osteo introducer system comprised of a working cannula and a Kyphon osteo drill in the 3 pedicles. The combinations were then advanced over a Kyphon osteo bone pin until the tip of the Kyphon osteo drill was in the posterior third at T10 and at L2. At this time, the bone pins were removed. In a medial trajectory, the combination was advanced until the tip of the working cannula was inside the posterior one-third at T10, and at L2. The osteo drills were removed and a core samples sent for pathologic analysis. Through each working cannulae, a Kyphon bone biopsy device was advanced to within 5 mm of the anterior aspect of T10 and L2. Core samples from these were also sent for pathologic analysis. Through the working cannulae, a Kyphon inflatable bone tamp 20 x 3 was advanced and positioned with the distal marker 5 mm from the anterior aspect of T10 and L2. Crossing of the midline was seen on the AP projection. At this time, the balloons were expanded using contrast via a Kyphon inflation syringe device via microtubing. Inflations were continued until there was apposition with the superior and the inferior endplates into L2, and the inferior endplate at H84. At this time, methylmethacrylate mixture was reconstituted with Tobramycin in the Kyphon bone mixing device system. This was  then loaded onto the Kyphon bone fillers. The balloons were deflated and removed followed by the  instillation of 4-1/2 bone filler equivalents of methylmethacrylate mixture at L2, and 2 bone filler equivalent of microcatheter mixture at T10 with excellent filling in the AP and lateral projections. No extravasation was noted in the disk spaces or posteriorly into the spinal canal. No epidural venous contamination was seen. The working cannulae and the bone fillers were then retrieved and removed. Hemostasis was achieved at the skin entry sites. The patient tolerated the procedure well. IMPRESSION: 1. Status post fluoroscopic-guided needle placement for deep core bone biopsy at T10 and L2. 2. Status post vertebral body augmentation using balloon kyphoplasty at T10 and L2 as described without event. 3. Patient advised to check with her referring MD for results of the biopsy obtained as per the request of her referring MD. Electronically Signed   By: Luanne Bras M.D.   On: 02/07/2019 14:46   Ir Kypho Ea Addl Level Thoracic Or Lumbar  Result Date: 02/08/2019 INDICATION: Severe low back pain secondary to compression fractures at T10 and at L2. EXAM: BALLOON KYPHOPLASTY AT T10 AND AT L2. COMPARISON:  MRI MRA of the brain of a few weeks ago. MEDICATIONS: As antibiotic prophylaxis, Ancef 2 g IV was ordered pre-procedure and administered intravenously within 1 hour of incision. ANESTHESIA/SEDATION: Moderate (conscious) sedation was employed during this procedure. A total of Versed 2.5 mg and Fentanyl 75 mcg was administered intravenously. Moderate Sedation Time: 54 minutes. The patient's level of consciousness and vital signs were monitored continuously by radiology nursing throughout the procedure under my direct supervision. FLUOROSCOPY TIME:  Fluoroscopy Time: 21 minutes 6 seconds (7,416 mGy) COMPLICATIONS: None immediate. PROCEDURE: Following a full explanation of the procedure along with the potential associated complications, an informed witnessed consent was obtained. The patient was placed prone on the  fluoroscopic table. The skin overlying the thoracolumbar region was then prepped and draped in the usual sterile fashion. The right pedicle at T10, and both pedicles at L2 were then infiltrated with 0.25% bupivacaine, followed by the advancement of an 11-gauge Jamshidi needle through the right pedicle of T10, and both pedicles at L2 into the posterior one-third at these levels. These were then exchanged for a Kyphon advanced osteo introducer system comprised of a working cannula and a Kyphon osteo drill in the 3 pedicles. The combinations were then advanced over a Kyphon osteo bone pin until the tip of the Kyphon osteo drill was in the posterior third at T10 and at L2. At this time, the bone pins were removed. In a medial trajectory, the combination was advanced until the tip of the working cannula was inside the posterior one-third at T10, and at L2. The osteo drills were removed and a core samples sent for pathologic analysis. Through each working cannulae, a Kyphon bone biopsy device was advanced to within 5 mm of the anterior aspect of T10 and L2. Core samples from these were also sent for pathologic analysis. Through the working cannulae, a Kyphon inflatable bone tamp 20 x 3 was advanced and positioned with the distal marker 5 mm from the anterior aspect of T10 and L2. Crossing of the midline was seen on the AP projection. At this time, the balloons were expanded using contrast via a Kyphon inflation syringe device via microtubing. Inflations were continued until there was apposition with the superior and the inferior endplates into L2, and the inferior endplate at L84. At this time, methylmethacrylate mixture was reconstituted  with Tobramycin in the Kyphon bone mixing device system. This was then loaded onto the Kyphon bone fillers. The balloons were deflated and removed followed by the instillation of 4-1/2 bone filler equivalents of methylmethacrylate mixture at L2, and 2 bone filler equivalent of  microcatheter mixture at T10 with excellent filling in the AP and lateral projections. No extravasation was noted in the disk spaces or posteriorly into the spinal canal. No epidural venous contamination was seen. The working cannulae and the bone fillers were then retrieved and removed. Hemostasis was achieved at the skin entry sites. The patient tolerated the procedure well. IMPRESSION: 1. Status post fluoroscopic-guided needle placement for deep core bone biopsy at T10 and L2. 2. Status post vertebral body augmentation using balloon kyphoplasty at T10 and L2 as described without event. 3. Patient advised to check with her referring MD for results of the biopsy obtained as per the request of her referring MD. Electronically Signed   By: Luanne Bras M.D.   On: 02/07/2019 14:46    Assessment & Plan:   There are no diagnoses linked to this encounter.   No orders of the defined types were placed in this encounter.    Follow-up: No follow-ups on file.  Walker Kehr, MD

## 2019-06-21 NOTE — Patient Instructions (Signed)
Use baking soda in water solution to rinse your mouth or try [employer] Peroxicare tooth paste again

## 2019-06-22 ENCOUNTER — Ambulatory Visit: Payer: Medicare Other

## 2019-06-25 NOTE — Progress Notes (Signed)
Subjective:  Alyssa Luna presents to clinic today with cc of  painful, thick, discolored, elongated toenails 1-5 b/l that become tender and cannot cut because of thickness. Pain is aggravated when wearing enclosed shoe gear.  Her ulceration of the right lateral malleolus has healed. She voices no new pedal concerns on today's visit.   She remains on blood thinner, Xarelto.  Current Outpatient Medications on File Prior to Visit  Medication Sig Dispense Refill  . acetaminophen (TYLENOL) 325 MG tablet Take by mouth.    [name] amiodarone (PACERONE) 200 MG tablet Take 200 mg by mouth daily.    [name] amoxicillin (AMOXIL) 500 MG tablet TAKE 1 TABLET BY MOUTH EVERY 8 HOURS UNTIL FINISHED    . azithromycin (ZITHROMAX) 250 MG tablet TAKE 2 TABLETS BY MOUTH EVERY DAY FOR 3 DAYS    . cetirizine (ZYRTEC) 10 MG tablet Take 10 mg by mouth daily as needed for allergies.    . chlorhexidine (PERIDEX) 0.12 % solution PLEASE SEE ATTACHED FOR DETAILED DIRECTIONS    . Cholecalciferol (D3 DOTS) 2000 units TBDP Take by mouth daily.    . Cyanocobalamin (VITAMIN B-12 IJ) Inject as directed every 30 (thirty) days.    [name] denosumab (PROLIA) 60 MG/ML SOLN injection Inject 60 mg into the skin every 6 (six) months. Administer in upper arm, thigh, or abdomen Unable to tolerate oral meds Dx severe osteoporosis 1.8 mL 6  . diclofenac sodium (VOLTAREN) 1 % GEL Apply topically.    . diphenhydrAMINE HCl (BENADRYL ALLERGY CHILDRENS PO) Take by mouth at bedtime as needed. Takes at night when itching    . erythromycin (ERY-TAB) 500 MG EC tablet Take 1 tablet (500 mg total) by mouth 3 (three) times daily. 30 tablet 0  . famotidine (PEPCID) 20 MG tablet Take 1 tablet (20 mg total) by mouth 2 (two) times daily. 180 tablet 3  . furosemide (LASIX) 20 MG tablet TAKE 1 TABLET (20 MG TOTAL) BY MOUTH DAILY AS NEEDED FOR EDEMA. 90 tablet 1  . gabapentin (NEURONTIN) 600 MG tablet TAKE 1/2 TABLET BY MOUTH 4 TIMES A DAY    . gentamicin ointment  (GARAMYCIN) 0.1 % Apply 1 application topically 3 (three) times daily. 15 g 0  . hydrocortisone 2.5 % cream Apply 1 application topically 3 (three) times daily as needed (itching).    [name] ibuprofen (ADVIL) 200 MG tablet Take by mouth.    . levothyroxine (SYNTHROID) 25 MCG tablet Take 1 tablet (25 mcg total) by mouth daily before breakfast. 90 tablet 1  . LORazepam (ATIVAN) 0.5 MG tablet Take 1-2 tablets (0.5-1 mg total) by mouth 2 (two) times daily as needed for anxiety or sleep. 60 tablet 2  . montelukast (SINGULAIR) 10 MG tablet TAKE 1 TABLET BY MOUTH EVERY DAY 90 tablet 1  . mupirocin ointment (BACTROBAN) 2 % Apply once daily to ankle wound 22 g 0  . potassium chloride (K-DUR) 10 MEQ tablet TAKE 1 TABLET (10 MEQ TOTAL) BY MOUTH DAILY. TAKE WHILE ON FUROSEMIDE 90 tablet 1  . predniSONE (STERAPRED UNI-PAK 48 TAB) 5 MG (48) TBPK tablet See admin instructions. see package    . Rivaroxaban (XARELTO) 15 MG TABS tablet Take 15 mg by mouth daily.     . Simethicone (GAS-X PO) Take 1 tablet by mouth daily as needed (flatulence).     [name] sulfamethoxazole-trimethoprim (BACTRIM DS) 800-160 MG tablet TAKE 1 TABLET (160 MG OF TRIMETHOPRIM TOTAL) BY MOUTH ONCE DAILY FOR 14 DAYS    . tiZANidine (ZANAFLEX)  2 MG tablet Take 1 tablet (2 mg total) by mouth every 6 (six) hours as needed for muscle spasms. 30 tablet 0  . traMADol (ULTRAM) 50 MG tablet Take 1 tablet (50 mg total) by mouth every 6 (six) hours as needed. 30 tablet 0  . Wheat Dextrin (BENEFIBER PO) Take 1 tablet by mouth daily.     No current facility-administered medications on file prior to visit.      Allergies  Allergen Reactions  . Beef (Bovine) Protein Other (See Comments)    Alpha-gal allergy testing positive June 2018  . Galactose Nausea Only    Per patient, cannot have alpha-gal since tic bite episode.  . Pravastatin Anaphylaxis and Other (See Comments)    Legs ache  . Acetaminophen Other (See Comments)  . Amlodipine     Leg swelling  .  Colchicine Other (See Comments)  . Doxycycline Other (See Comments)    Nausea - able to take w/food  . Shellfish Allergy Other (See Comments)    Unknown  . Tikosyn [Dofetilide] Other (See Comments)    Unknown reaction and was told never to take it due to problems during sleep   . Actonel [Risedronate Sodium] Other (See Comments)    Not known  . Alendronate Other (See Comments)    NOT KNOWN  . Augmentin [Amoxicillin-Pot Clavulanate] Rash    Has patient had a PCN reaction causing immediate rash, facial/tongue/throat swelling, SOB or lightheadedness with hypotension: Yes Has patient had a PCN reaction causing severe rash involving mucus membranes or skin necrosis:No Has patient had a PCN reaction that required hospitalization: No Has patient had a PCN reaction occurring within the last 10 years: No If all of the above answers are "NO", then may proceed with Cephalosporin use.   . Ciprofloxacin Nausea Only  . Evista [Raloxifene] Other (See Comments)    Unknown reaction  . Flagyl [Metronidazole] Other (See Comments)    Not known  . Fosamax [Alendronate Sodium] Other (See Comments)    NOT KNOWN  . Gold-Containing Drug Products Other (See Comments)    Per Dr  . Loma Sousa [Escitalopram Oxalate] Other (See Comments)    shaky  . Risedronate Other (See Comments)    Unknown reaction  . Simvastatin Other (See Comments)    REACTION: leg cramps, weakness  . Tramadol Other (See Comments)    Mental disturbance changes     Objective: There were no vitals filed for this visit.  Physical Examination:  Vascular Examination: Capillary refill time immediate b/l.  Palpable DP/PT pulses b/l.  Digital hair present b/l.  No edema noted b/l.  Skin temperature gradient WNL b/l.  Dermatological Examination: Skin with normal turgor, texture and tone b/l.  No open wounds b/l.  No interdigital macerations noted b/l.  Elongated, thick, discolored brittle toenails with subungual debris and  pain on dorsal palpation of nailbeds 1-5 b/l.  Hyperkeratotic lesion right 5th digit. No erythema, no edema, no drainage, no flocculence noted.  Musculoskeletal Examination: Muscle strength 5/5 to all muscle groups b/l  No pain, crepitus or joint discomfort with active/passive ROM.  Neurological Examination: Sensation intact 5/5 b/l with 10 gram monofilament.  Vibratory sensation intact b/l.  Proprioceptive sensation intact b/l.  Assessment: Mycotic nail infection with pain 1-5 b/l Corn right 5th digit Pt on long term blood thinner  Plan: 1. Toenails 1-5 b/l were debrided in length and girth without iatrogenic laceration.  2. Corn right 5th digit pared utilizing sterile scalpel blade without incident. 3. Continue soft,  supportive shoe gear daily. 4. Report any pedal injuries to medical professional. 5. Follow up 9 weeks. 6. Patient/POA to call should there be a question/concern in there interim.

## 2019-06-27 ENCOUNTER — Ambulatory Visit: Payer: Medicare Other | Admitting: Internal Medicine

## 2019-06-28 IMAGING — MR MRI LUMBAR SPINE WITHOUT CONTRAST
4 of 6 series · 24 of 48 positions shown · non-contrast
Comparison: CT abdomen and pelvis 01/29/2019.

CLINICAL DATA: Low back pain radiating into the left leg for 1
month.

EXAM:
MRI LUMBAR SPINE WITHOUT CONTRAST
TECHNIQUE: Multiplanar, multisequence MR imaging of the lumbar spine was
performed. No intravenous contrast was administered.

[Series 4: T1 · sagittal · 4.0mm · 0.55mm/px · 3 of 13 slices shown]
[im 1/13]
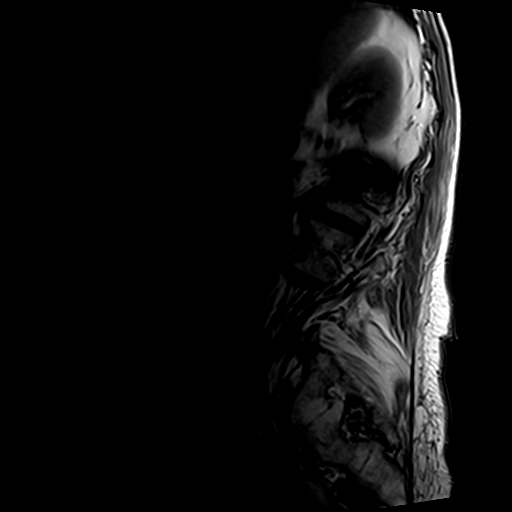
[im 9/13]
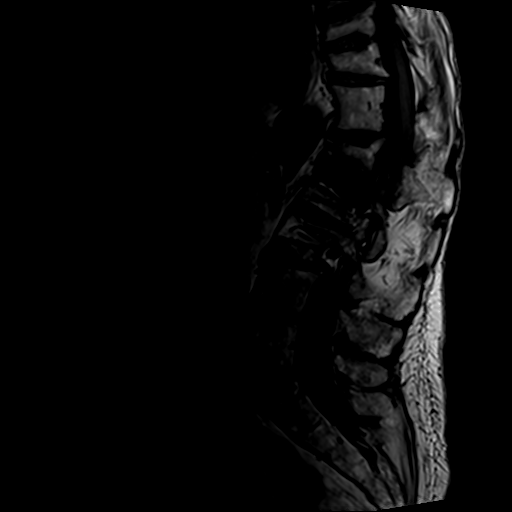
[im 13/13]
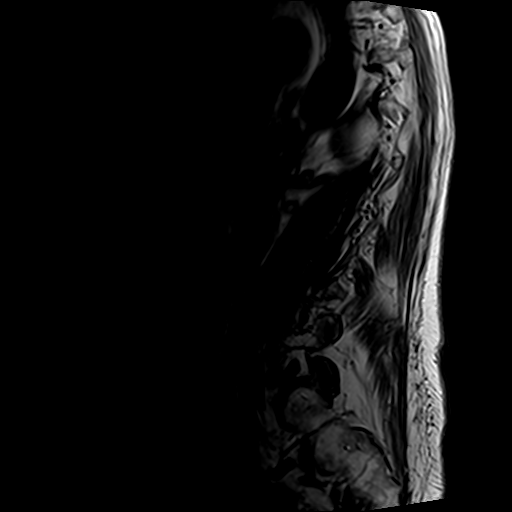

[Series 5: T2 post-contrast · sagittal · 4.0mm · 0.55mm/px · 6 of 17 slices shown (1 of 2)]
[im 1/17]
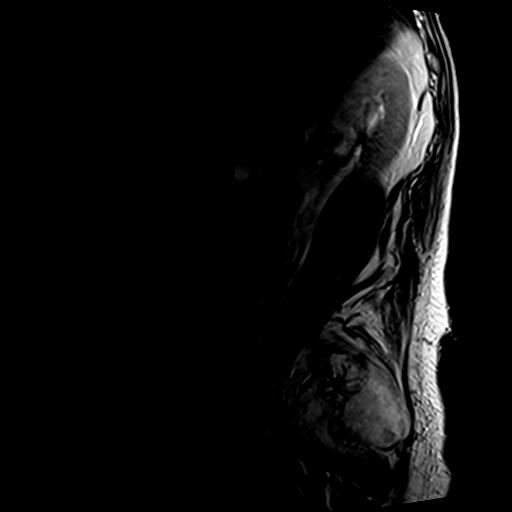
[im 4/17]
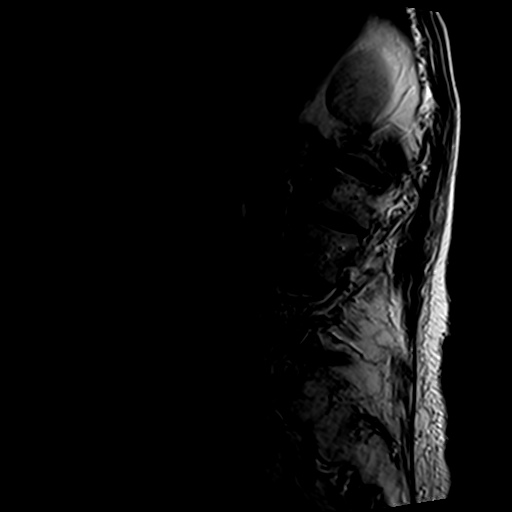
[im 7/17]
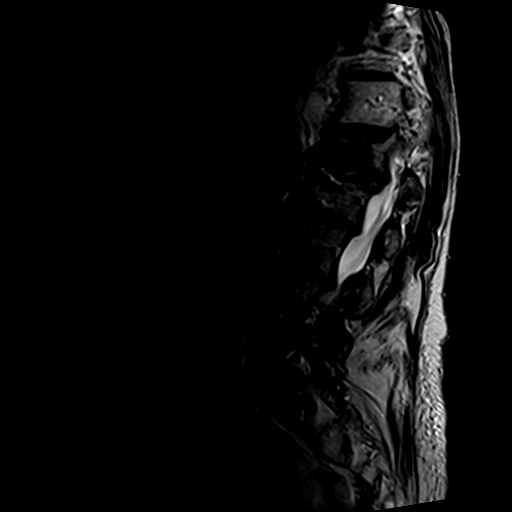
[im 10/17]
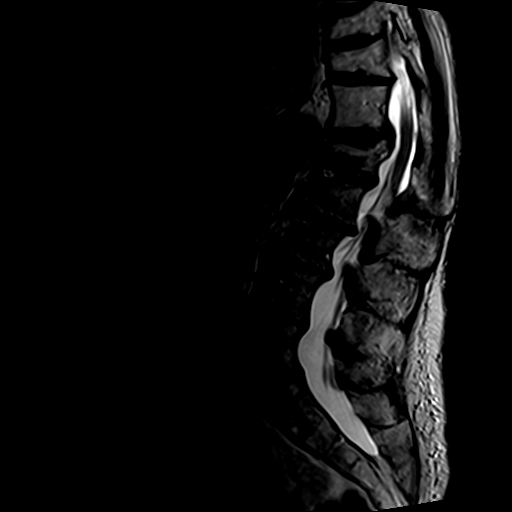
[im 13/17]
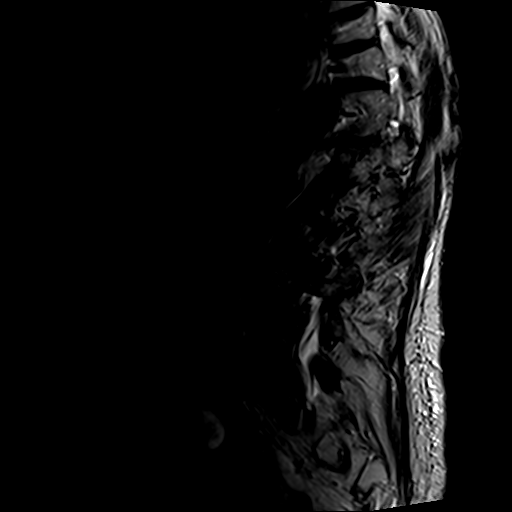
[im 17/17]
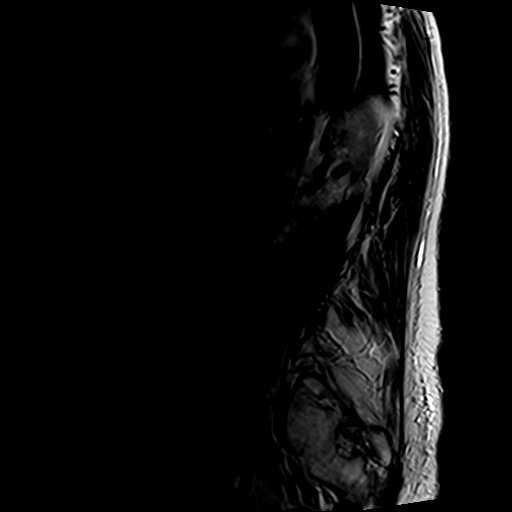

[Series 6: T2 · axial · 4.0mm · 0.70mm/px · z∈[+38,+258]mm · 9 of 40 slices shown]
[im 1/40]
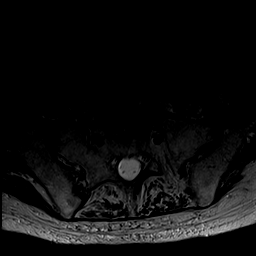
[im 7/40]
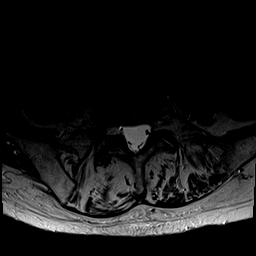
[im 14/40]
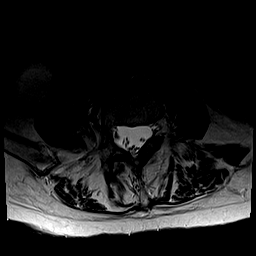
[im 17/40]
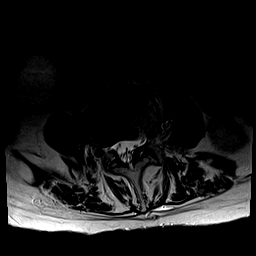
[im 20/40]
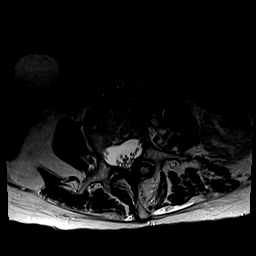
[im 23/40]
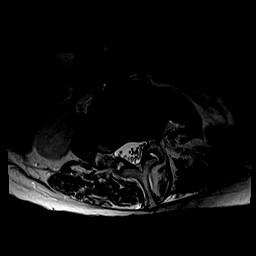
[im 27/40]
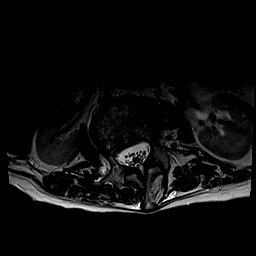
[im 33/40]
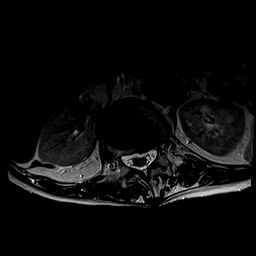
[im 40/40]
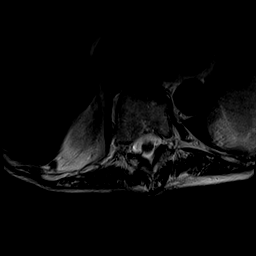

[Series 8: T2 post-contrast · coronal · 4.0mm · 0.55mm/px · 6 of 17 slices shown (2 of 2)]
[im 1/17]
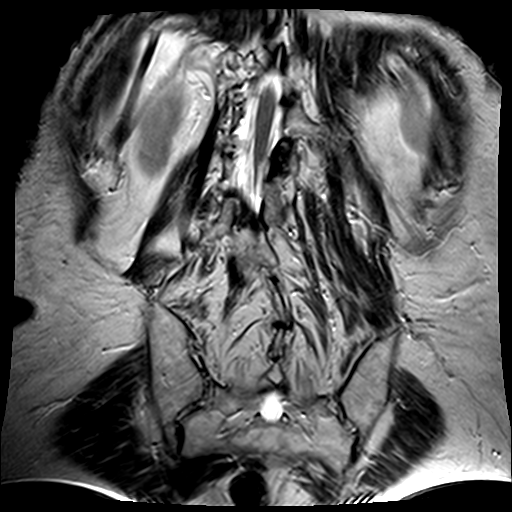
[im 4/17]
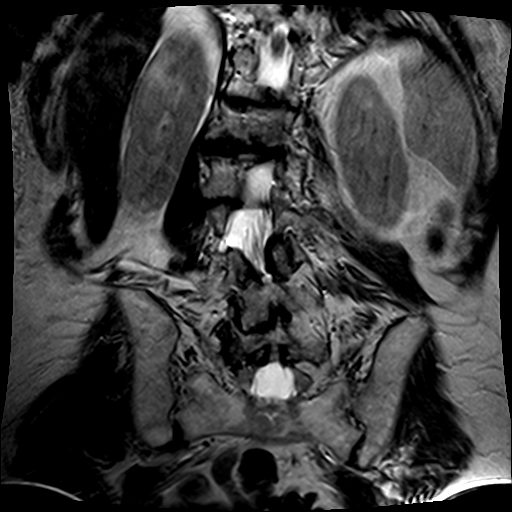
[im 7/17]
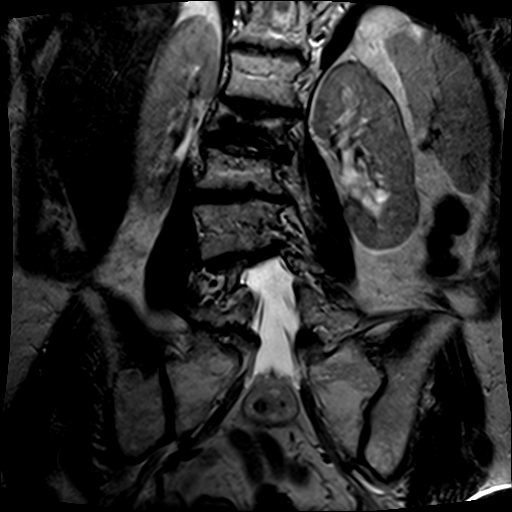
[im 10/17]
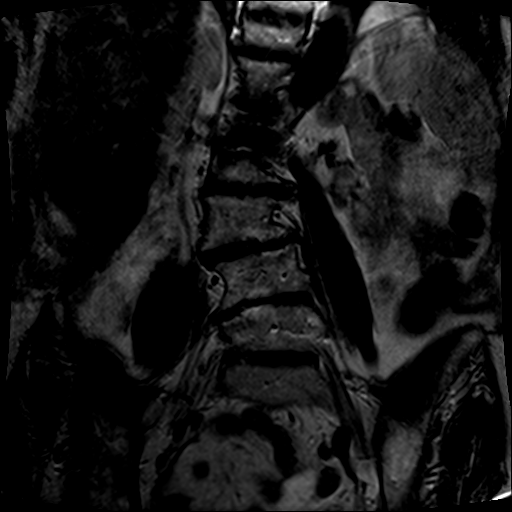
[im 13/17]
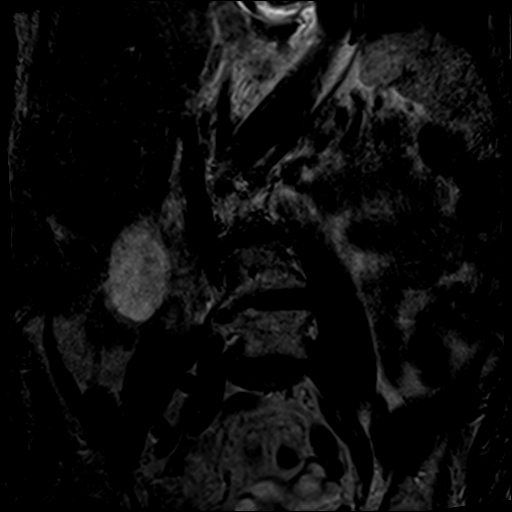
[im 17/17]
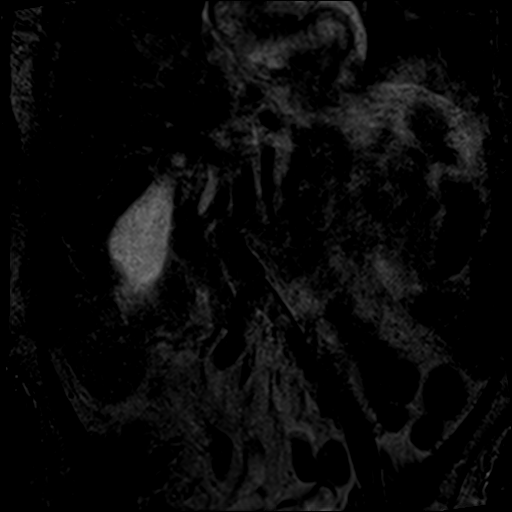

[24 of 48 positions shown; findings below may reference images not displayed]

FINDINGS: Segmentation:  Standard.

Alignment: Marked convex right scoliosis with the apex at L2-3 is
present.

Vertebrae: The patient has a superior endplate compression fracture
of L2 with vertebral body height loss of up to approximately 30%.
There is marrow edema in the vertebral body consistent with acute or
early subacute injury. Superior endplate compression fractures of
T10 and T11 are also identified. There is minimal marrow edema in
the superior endplate of T10 consistent with late subacute to remote
injury. The T11 fracture is old. The patient is status post
vertebral augmentation at L1.

Conus medullaris and cauda equina: Conus extends to the L2 level.
Conus and cauda equina appear normal.

Paraspinal and other soft tissues: Negative.

Disc levels:

T9-10 and T10-11 are imaged in the sagittal plane only. The central
canal and foramina appear open at both levels.

T11-12: Shallow disc bulge without stenosis.

T12-L1: A left paracentral protrusion is present with some cephalad
extension. There is some bony retropulsion off the superior endplate
of L1. The central canal the patient's protrusion indents the left
side of the thecal sac mildly deflecting the cord in conjunction
with bony retropulsion. The foramina are open.

L1-2: Shallow left paracentral protrusion without stenosis. No
notable retropulsion off the superior endplate of L2.

L3-4: Shallow disc bulge to the left causes mild narrowing in the
left subarticular recess and foramen. The right foramen is open.

L3-4: There is a shallow disc bulge and some facet degenerative
disease. There is mild narrowing in the left subarticular recess.
The central canal and right foramen are open.

L4-5: Small right foraminal protrusion and endplate spur are
superimposed on a shallow bulge. There is moderate right foraminal
narrowing. The central canal and left foramen are open.

L5-S1: Right worse than left facet arthropathy and a shallow disc
bulge. No stenosis.
IMPRESSION: Acute or early subacute superior endplate compression fracture of L2
with vertebral body height loss of approximately 30%. There is
minimal retropulsion off the superior endplate of L2. No involvement
of the posterior elements. The appearance of the fractures
consistent with a senile osteoporotic injury.

Late subacute to remote superior endplate compression fracture of
T10.

Remote compression fractures of T11 and L1.

Severe convex right scoliosis.

Left paracentral protrusion at T12-L1 mildly deflects the cord at
T12-L1 in conjunction with retropulsion off the superior endplate of
L1.

Mild narrowing in the left subarticular recess at L3-4 due to a
shallow bulge to the left.

Mild left subarticular recess narrowing L3-4 due to a disc bulge.

Moderate right foraminal narrowing L4-5.

## 2019-06-30 ENCOUNTER — Other Ambulatory Visit: Payer: Self-pay

## 2019-06-30 ENCOUNTER — Ambulatory Visit (INDEPENDENT_AMBULATORY_CARE_PROVIDER_SITE_OTHER): Payer: Medicare Other

## 2019-06-30 DIAGNOSIS — M810 Age-related osteoporosis without current pathological fracture: Secondary | ICD-10-CM

## 2019-06-30 MED ORDER — DENOSUMAB 60 MG/ML ~~LOC~~ SOSY
60.0000 mg | PREFILLED_SYRINGE | Freq: Once | SUBCUTANEOUS | Status: AC
  Administered 2019-06-30: 16:00:00 60 mg via SUBCUTANEOUS

## 2019-07-03 ENCOUNTER — Other Ambulatory Visit: Payer: Self-pay

## 2019-07-03 ENCOUNTER — Encounter: Payer: Self-pay | Admitting: Specialist

## 2019-07-03 ENCOUNTER — Ambulatory Visit (INDEPENDENT_AMBULATORY_CARE_PROVIDER_SITE_OTHER): Payer: Medicare Other | Admitting: Specialist

## 2019-07-03 VITALS — BP 150/68 | HR 59 | Ht 60.0 in | Wt 126.0 lb

## 2019-07-03 DIAGNOSIS — M48062 Spinal stenosis, lumbar region with neurogenic claudication: Secondary | ICD-10-CM

## 2019-07-03 DIAGNOSIS — M8000XA Age-related osteoporosis with current pathological fracture, unspecified site, initial encounter for fracture: Secondary | ICD-10-CM | POA: Diagnosis not present

## 2019-07-03 DIAGNOSIS — M4125 Other idiopathic scoliosis, thoracolumbar region: Secondary | ICD-10-CM
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
## 2019-07-03 NOTE — Patient Instructions (Signed)
Avoid bending, stooping and avoid lifting weights greater than 10 lbs. Avoid prolong standing and walking. Avoid frequent bending and stooping  No lifting greater than 10 lbs. May use ice or moist heat for pain. Weight loss is of benefit. Handicap license is approved. Dr. Romona Curls secretary/Assistant will call to arrange for evaluation of spinal cord stimulator, on xarelto and has history of  Osteopenia and multiple kyphoplasties and a kyphoscoliosis centered to the right at L2-3.  Dx is spinal stenosis, symtoms of neurogenic claudication.

## 2019-07-10 ENCOUNTER — Other Ambulatory Visit: Payer: Self-pay

## 2019-07-10 ENCOUNTER — Ambulatory Visit (INDEPENDENT_AMBULATORY_CARE_PROVIDER_SITE_OTHER): Payer: Medicare Other | Admitting: Otolaryngology

## 2019-07-10 DIAGNOSIS — K140 Glossitis: Secondary | ICD-10-CM | POA: Diagnosis not present

## 2019-07-10 DIAGNOSIS — K219 Gastro-esophageal reflux disease without esophagitis: Secondary | ICD-10-CM | POA: Diagnosis not present

## 2019-07-10 DIAGNOSIS — R490 Dysphonia: Secondary | ICD-10-CM | POA: Diagnosis not present
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
## 2019-07-15 NOTE — Progress Notes (Signed)
Medical screening examination/treatment/procedure(s) were performed by non-physician practitioner and as supervising physician I was immediately available for consultation/collaboration. I agree with above. Lew Dawes, MD

## 2019-07-19 ENCOUNTER — Other Ambulatory Visit: Payer: Self-pay

## 2019-07-19 ENCOUNTER — Ambulatory Visit (INDEPENDENT_AMBULATORY_CARE_PROVIDER_SITE_OTHER): Payer: Medicare Other | Admitting: Internal Medicine

## 2019-07-19 ENCOUNTER — Encounter: Payer: Self-pay | Admitting: Internal Medicine

## 2019-07-19 ENCOUNTER — Other Ambulatory Visit (INDEPENDENT_AMBULATORY_CARE_PROVIDER_SITE_OTHER): Payer: Medicare Other

## 2019-07-19 DIAGNOSIS — R490 Dysphonia: Secondary | ICD-10-CM | POA: Diagnosis not present

## 2019-07-19 DIAGNOSIS — D649 Anemia, unspecified: Secondary | ICD-10-CM | POA: Diagnosis not present

## 2019-07-19 DIAGNOSIS — R5382 Chronic fatigue, unspecified: Secondary | ICD-10-CM

## 2019-07-19 LAB — CBC WITH DIFFERENTIAL/PLATELET
Basophils Absolute: 0 10*3/uL (ref 0.0–0.1)
Basophils Relative: 1.3 % (ref 0.0–3.0)
Eosinophils Absolute: 0 10*3/uL (ref 0.0–0.7)
Eosinophils Relative: 0.9 % (ref 0.0–5.0)
HCT: 32.5 % — ABNORMAL LOW (ref 36.0–46.0)
Hemoglobin: 10.3 g/dL — ABNORMAL LOW (ref 12.0–15.0)
Lymphocytes Relative: 45.9 % (ref 12.0–46.0)
Lymphs Abs: 1.6 10*3/uL (ref 0.7–4.0)
MCHC: 31.8 g/dL (ref 30.0–36.0)
MCV: 78.8 fl (ref 78.0–100.0)
Monocytes Absolute: 0.3 10*3/uL (ref 0.1–1.0)
Monocytes Relative: 9.1 % (ref 3.0–12.0)
Neutro Abs: 1.4 10*3/uL (ref 1.4–7.7)
Neutrophils Relative %: 42.8 % — ABNORMAL LOW (ref 43.0–77.0)
Platelets: 195 10*3/uL (ref 150.0–400.0)
RBC: 4.12 Mil/uL (ref 3.87–5.11)
RDW: 15.7 % — ABNORMAL HIGH (ref 11.5–15.5)
WBC: 3.4 10*3/uL — ABNORMAL LOW (ref 4.0–10.5)

## 2019-07-19 NOTE — Assessment & Plan Note (Signed)
[facility]

## 2019-07-19 NOTE — Assessment & Plan Note (Signed)
S/p ENT eval - Dr Lucia Gaskins

## 2019-07-19 NOTE — Progress Notes (Signed)
Subjective:  Patient ID: Alyssa Luna, female    DOB: 08/05/1934  Age: 85 y.o. MRN: [id number]  CC: No chief complaint on file.   HPI Alyssa Luna presents for  GERD on Famotidine, hoarseness - pt saw Dr Lucia Gaskins., ENT. Pt saw Dr Hilarie Fredrickson in the past. C/o fatigue Verneice moved to [facility]) in [place] in November. The pt went to [facility] on 07/18/19 - anemia, elevated LFT - she was told she needs iron...  Outpatient Medications Prior to Visit  Medication Sig Dispense Refill  . acetaminophen (TYLENOL) 325 MG tablet Take by mouth.    [name] amiodarone (PACERONE) 200 MG tablet Take 200 mg by mouth daily.    [name] amoxicillin (AMOXIL) 500 MG tablet TAKE 1 TABLET BY MOUTH EVERY 8 HOURS UNTIL FINISHED    . azithromycin (ZITHROMAX) 250 MG tablet TAKE 2 TABLETS BY MOUTH EVERY DAY FOR 3 DAYS    . cetirizine (ZYRTEC) 10 MG tablet Take 10 mg by mouth daily as needed for allergies.    . chlorhexidine (PERIDEX) 0.12 % solution PLEASE SEE ATTACHED FOR DETAILED DIRECTIONS    . Cholecalciferol (D3 DOTS) 2000 units TBDP Take by mouth daily.    . Cyanocobalamin (VITAMIN B-12 IJ) Inject as directed every 30 (thirty) days.    [name] denosumab (PROLIA) 60 MG/ML SOLN injection Inject 60 mg into the skin every 6 (six) months. Administer in upper arm, thigh, or abdomen Unable to tolerate oral meds Dx severe osteoporosis 1.8 mL 6  . diclofenac sodium (VOLTAREN) 1 % GEL Apply topically.    . diphenhydrAMINE HCl (BENADRYL ALLERGY CHILDRENS PO) Take by mouth at bedtime as needed. Takes at night when itching    . erythromycin (ERY-TAB) 500 MG EC tablet Take 1 tablet (500 mg total) by mouth 3 (three) times daily. 30 tablet 0  . famotidine (PEPCID) 20 MG tablet Take 1 tablet (20 mg total) by mouth 2 (two) times daily. 180 tablet 3  . furosemide (LASIX) 20 MG tablet TAKE 1 TABLET (20 MG TOTAL) BY MOUTH DAILY AS NEEDED FOR EDEMA. 90 tablet 1  . gabapentin (NEURONTIN) 600 MG tablet TAKE 1/2 TABLET BY MOUTH  4 TIMES A DAY    . gentamicin ointment (GARAMYCIN) 0.1 % Apply 1 application topically 3 (three) times daily. 15 g 0  . hydrocortisone 2.5 % cream Apply 1 application topically 3 (three) times daily as needed (itching).    [name] ibuprofen (ADVIL) 200 MG tablet Take by mouth.    . levothyroxine (SYNTHROID) 25 MCG tablet Take 1 tablet (25 mcg total) by mouth daily before breakfast. 90 tablet 1  . LORazepam (ATIVAN) 0.5 MG tablet Take 1-2 tablets (0.5-1 mg total) by mouth 2 (two) times daily as needed for anxiety or sleep. 60 tablet 2  . montelukast (SINGULAIR) 10 MG tablet TAKE 1 TABLET BY MOUTH EVERY DAY 90 tablet 1  . mupirocin ointment (BACTROBAN) 2 % Apply once daily to ankle wound 22 g 0  . pantoprazole (PROTONIX) 40 MG tablet Take 1 tablet (40 mg total) by mouth daily. 30 tablet 11  . potassium chloride (K-DUR) 10 MEQ tablet TAKE 1 TABLET (10 MEQ TOTAL) BY MOUTH DAILY. TAKE WHILE ON FUROSEMIDE 90 tablet 1  . predniSONE (STERAPRED UNI-PAK 48 TAB) 5 MG (48) TBPK tablet See admin instructions. see package    . Rivaroxaban (XARELTO) 15 MG TABS tablet Take 15 mg by mouth daily.     . Simethicone (GAS-X PO) Take 1 tablet by  mouth daily as needed (flatulence).     [name] sulfamethoxazole-trimethoprim (BACTRIM DS) 800-160 MG tablet TAKE 1 TABLET (160 MG OF TRIMETHOPRIM TOTAL) BY MOUTH ONCE DAILY FOR 14 DAYS    . tiZANidine (ZANAFLEX) 2 MG tablet Take 1 tablet (2 mg total) by mouth every 6 (six) hours as needed for muscle spasms. 30 tablet 0  . Wheat Dextrin (BENEFIBER PO) Take 1 tablet by mouth daily.     No facility-administered medications prior to visit.     ROS: Review of Systems  Constitutional: Positive for fatigue. Negative for activity change, appetite change, chills and unexpected weight change.  HENT: Negative for congestion, mouth sores and sinus pressure.   Eyes: Negative for visual disturbance.  Respiratory: Negative for cough and chest tightness.   Gastrointestinal: Negative for abdominal  pain and nausea.  Genitourinary: Negative for difficulty urinating, frequency and vaginal pain.  Musculoskeletal: Positive for arthralgias and gait problem. Negative for back pain.  Skin: Negative for pallor and rash.  Neurological: Negative for dizziness, tremors, weakness, numbness and headaches.  Psychiatric/Behavioral: Negative for confusion and sleep disturbance.    Objective:  There were no vitals taken for this visit.  BP Readings from Last 3 Encounters:  07/03/19 (!) 150/68  06/21/19 130/80  05/01/19 (!) 148/64    Wt Readings from Last 3 Encounters:  07/03/19 126 lb (57.2 kg)  06/21/19 130 lb (59 kg)  05/01/19 126 lb (57.2 kg)    Physical Exam Constitutional:      General: She is not in acute distress.    Appearance: She is well-developed.  HENT:     Head: Normocephalic.     Right Ear: External ear normal.     Left Ear: External ear normal.     Nose: Nose normal.  Eyes:     General:        Right eye: No discharge.        Left eye: No discharge.     Conjunctiva/sclera: Conjunctivae normal.     Pupils: Pupils are equal, round, and reactive to light.  Neck:     Musculoskeletal: Normal range of motion and neck supple.     Thyroid: No thyromegaly.     Vascular: No JVD.     Trachea: No tracheal deviation.  Cardiovascular:     Rate and Rhythm: Normal rate and regular rhythm.     Heart sounds: Normal heart sounds.  Pulmonary:     Effort: No respiratory distress.     Breath sounds: No stridor. No wheezing.  Abdominal:     General: Bowel sounds are normal. There is no distension.     Palpations: Abdomen is soft. There is no mass.     Tenderness: There is no abdominal tenderness. There is no guarding or rebound.  Musculoskeletal:        General: No tenderness.  Lymphadenopathy:     Cervical: No cervical adenopathy.  Skin:    Findings: No erythema or rash.  Neurological:     Cranial Nerves: No cranial nerve deficit.     Motor: No abnormal muscle tone.      Coordination: Coordination normal.     Gait: Gait abnormal.     Deep Tendon Reflexes: Reflexes normal.  Psychiatric:        Behavior: Behavior normal.        Thought Content: Thought content normal.        Judgment: Judgment normal.   walker  Lab Results  Component Value Date   WBC 3.5 (  L) 04/26/2019   HGB 10.2 (L) 04/26/2019   HCT 32.2 (L) 04/26/2019   PLT 208.0 04/26/2019   GLUCOSE 74 04/26/2019   CHOL 160 02/25/2018   TRIG 119.0 02/25/2018   HDL 42.90 02/25/2018   LDLDIRECT 178.0 09/24/2011   LDLCALC 94 02/25/2018   ALT 44 (H) 04/26/2019   AST 64 (H) 04/26/2019   NA 140 04/26/2019   K 4.3 04/26/2019   CL 105 04/26/2019   CREATININE 0.76 04/26/2019   BUN 15 04/26/2019   CO2 29 04/26/2019   TSH 11.91 (H) 12/20/2018   INR 1.3 (H) 02/07/2019    Ir Kypho Lumbar Inc Fx Reduce Bone Bx Uni/bil Cannulation Inc/imaging  Result Date: 02/08/2019 INDICATION: Severe low back pain secondary to compression fractures at T10 and at L2. EXAM: BALLOON KYPHOPLASTY AT T10 AND AT L2. COMPARISON:  MRI MRA of the brain of a few weeks ago. MEDICATIONS: As antibiotic prophylaxis, Ancef 2 g IV was ordered pre-procedure and administered intravenously within 1 hour of incision. ANESTHESIA/SEDATION: Moderate (conscious) sedation was employed during this procedure. A total of Versed 2.5 mg and Fentanyl 75 mcg was administered intravenously. Moderate Sedation Time: 54 minutes. The patient's level of consciousness and vital signs were monitored continuously by radiology nursing throughout the procedure under my direct supervision. FLUOROSCOPY TIME:  Fluoroscopy Time: 21 minutes 6 seconds (1,610 mGy) COMPLICATIONS: None immediate. PROCEDURE: Following a full explanation of the procedure along with the potential associated complications, an informed witnessed consent was obtained. The patient was placed prone on the fluoroscopic table. The skin overlying the thoracolumbar region was then prepped and draped in the  usual sterile fashion. The right pedicle at T10, and both pedicles at L2 were then infiltrated with 0.25% bupivacaine, followed by the advancement of an 11-gauge Jamshidi needle through the right pedicle of T10, and both pedicles at L2 into the posterior one-third at these levels. These were then exchanged for a Kyphon advanced osteo introducer system comprised of a working cannula and a Kyphon osteo drill in the 3 pedicles. The combinations were then advanced over a Kyphon osteo bone pin until the tip of the Kyphon osteo drill was in the posterior third at T10 and at L2. At this time, the bone pins were removed. In a medial trajectory, the combination was advanced until the tip of the working cannula was inside the posterior one-third at T10, and at L2. The osteo drills were removed and a core samples sent for pathologic analysis. Through each working cannulae, a Kyphon bone biopsy device was advanced to within 5 mm of the anterior aspect of T10 and L2. Core samples from these were also sent for pathologic analysis. Through the working cannulae, a Kyphon inflatable bone tamp 20 x 3 was advanced and positioned with the distal marker 5 mm from the anterior aspect of T10 and L2. Crossing of the midline was seen on the AP projection. At this time, the balloons were expanded using contrast via a Kyphon inflation syringe device via microtubing. Inflations were continued until there was apposition with the superior and the inferior endplates into L2, and the inferior endplate at R60. At this time, methylmethacrylate mixture was reconstituted with Tobramycin in the Kyphon bone mixing device system. This was then loaded onto the Kyphon bone fillers. The balloons were deflated and removed followed by the instillation of 4-1/2 bone filler equivalents of methylmethacrylate mixture at L2, and 2 bone filler equivalent of microcatheter mixture at T10 with excellent filling in the AP and lateral  projections. No extravasation was  noted in the disk spaces or posteriorly into the spinal canal. No epidural venous contamination was seen. The working cannulae and the bone fillers were then retrieved and removed. Hemostasis was achieved at the skin entry sites. The patient tolerated the procedure well. IMPRESSION: 1. Status post fluoroscopic-guided needle placement for deep core bone biopsy at T10 and L2. 2. Status post vertebral body augmentation using balloon kyphoplasty at T10 and L2 as described without event. 3. Patient advised to check with her referring MD for results of the biopsy obtained as per the request of her referring MD. Electronically Signed   By: Luanne Bras M.D.   On: 02/07/2019 14:46   Ir Kypho Ea Addl Level Thoracic Or Lumbar  Result Date: 02/08/2019 INDICATION: Severe low back pain secondary to compression fractures at T10 and at L2. EXAM: BALLOON KYPHOPLASTY AT T10 AND AT L2. COMPARISON:  MRI MRA of the brain of a few weeks ago. MEDICATIONS: As antibiotic prophylaxis, Ancef 2 g IV was ordered pre-procedure and administered intravenously within 1 hour of incision. ANESTHESIA/SEDATION: Moderate (conscious) sedation was employed during this procedure. A total of Versed 2.5 mg and Fentanyl 75 mcg was administered intravenously. Moderate Sedation Time: 54 minutes. The patient's level of consciousness and vital signs were monitored continuously by radiology nursing throughout the procedure under my direct supervision. FLUOROSCOPY TIME:  Fluoroscopy Time: 21 minutes 6 seconds (1,751 mGy) COMPLICATIONS: None immediate. PROCEDURE: Following a full explanation of the procedure along with the potential associated complications, an informed witnessed consent was obtained. The patient was placed prone on the fluoroscopic table. The skin overlying the thoracolumbar region was then prepped and draped in the usual sterile fashion. The right pedicle at T10, and both pedicles at L2 were then infiltrated with 0.25% bupivacaine,  followed by the advancement of an 11-gauge Jamshidi needle through the right pedicle of T10, and both pedicles at L2 into the posterior one-third at these levels. These were then exchanged for a Kyphon advanced osteo introducer system comprised of a working cannula and a Kyphon osteo drill in the 3 pedicles. The combinations were then advanced over a Kyphon osteo bone pin until the tip of the Kyphon osteo drill was in the posterior third at T10 and at L2. At this time, the bone pins were removed. In a medial trajectory, the combination was advanced until the tip of the working cannula was inside the posterior one-third at T10, and at L2. The osteo drills were removed and a core samples sent for pathologic analysis. Through each working cannulae, a Kyphon bone biopsy device was advanced to within 5 mm of the anterior aspect of T10 and L2. Core samples from these were also sent for pathologic analysis. Through the working cannulae, a Kyphon inflatable bone tamp 20 x 3 was advanced and positioned with the distal marker 5 mm from the anterior aspect of T10 and L2. Crossing of the midline was seen on the AP projection. At this time, the balloons were expanded using contrast via a Kyphon inflation syringe device via microtubing. Inflations were continued until there was apposition with the superior and the inferior endplates into L2, and the inferior endplate at W25. At this time, methylmethacrylate mixture was reconstituted with Tobramycin in the Kyphon bone mixing device system. This was then loaded onto the Kyphon bone fillers. The balloons were deflated and removed followed by the instillation of 4-1/2 bone filler equivalents of methylmethacrylate mixture at L2, and 2 bone filler equivalent of microcatheter  mixture at T10 with excellent filling in the AP and lateral projections. No extravasation was noted in the disk spaces or posteriorly into the spinal canal. No epidural venous contamination was seen. The working  cannulae and the bone fillers were then retrieved and removed. Hemostasis was achieved at the skin entry sites. The patient tolerated the procedure well. IMPRESSION: 1. Status post fluoroscopic-guided needle placement for deep core bone biopsy at T10 and L2. 2. Status post vertebral body augmentation using balloon kyphoplasty at T10 and L2 as described without event. 3. Patient advised to check with her referring MD for results of the biopsy obtained as per the request of her referring MD. Electronically Signed   By: Luanne Bras M.D.   On: 02/07/2019 14:46    Assessment & Plan:   There are no diagnoses linked to this encounter.   No orders of the defined types were placed in this encounter.    Follow-up: No follow-ups on file.  Walker Kehr, MD

## 2019-07-19 NOTE — Assessment & Plan Note (Signed)
The pt went to [facility] on 07/18/19 - anemia, elevated LFT - she was told she needs iron...  CBC, iron tests

## 2019-07-20 ENCOUNTER — Other Ambulatory Visit: Payer: Self-pay | Admitting: Internal Medicine

## 2019-07-20 DIAGNOSIS — I4510 Unspecified right bundle-branch block: Secondary | ICD-10-CM | POA: Insufficient documentation

## 2019-07-20 DIAGNOSIS — E039 Hypothyroidism, unspecified: Secondary | ICD-10-CM | POA: Insufficient documentation

## 2019-07-20 DIAGNOSIS — Z8679 Personal history of other diseases of the circulatory system: Secondary | ICD-10-CM | POA: Insufficient documentation

## 2019-07-20 LAB — IRON,TIBC AND FERRITIN PANEL
%SAT: 5 % (calc) — ABNORMAL LOW (ref 16–45)
Ferritin: 12 ng/mL — ABNORMAL LOW (ref 16–288)
Iron: 19 ug/dL — ABNORMAL LOW (ref 45–160)
TIBC: 363 mcg/dL (calc) (ref 250–450)

## 2019-07-20 MED ORDER — FERROUS SULFATE 325 (65 FE) MG PO TABS: 325.0000 mg | ORAL_TABLET | Freq: Every day | ORAL | 1 refills | Status: DC

## 2019-07-21 ENCOUNTER — Other Ambulatory Visit: Payer: Self-pay

## 2019-07-21 MED ORDER — FERROUS SULFATE 325 (65 FE) MG PO TABS: 325.0000 mg | ORAL_TABLET | Freq: Every day | ORAL | 0 refills | Status: DC

## 2019-07-24 ENCOUNTER — Ambulatory Visit: Payer: Medicare Other | Admitting: Internal Medicine

## 2019-08-02 ENCOUNTER — Telehealth: Payer: Self-pay

## 2019-08-02 NOTE — Telephone Encounter (Signed)
Copied from [place] [id number]. Topic: General - Other >> Aug 02, 2019  2:43 PM Mcneil, Ja-Kwan wrote: Reason for CRM: Pt stated her Cardiologist suggested that she contact her pcp to order a stool sample due to her being anemic. Pt also stated that she thinks it is time for her B-12 shot. Pt requests call back

## 2019-08-03 NOTE — Telephone Encounter (Signed)
Pt notified, appointment scheduled

## 2019-08-03 NOTE — Telephone Encounter (Signed)
OK B12 OK HEMOCCULT CARDS PACKET Thx

## 2019-08-07 ENCOUNTER — Other Ambulatory Visit: Payer: Self-pay

## 2019-08-07 ENCOUNTER — Ambulatory Visit (INDEPENDENT_AMBULATORY_CARE_PROVIDER_SITE_OTHER): Payer: Medicare Other

## 2019-08-07 DIAGNOSIS — E538 Deficiency of other specified B group vitamins: Secondary | ICD-10-CM

## 2019-08-07 MED ORDER — CYANOCOBALAMIN 1000 MCG/ML IJ SOLN
1000.0000 ug | Freq: Once | INTRAMUSCULAR | Status: AC
  Administered 2019-08-07: 14:00:00 1000 ug via INTRAMUSCULAR

## 2019-08-29 ENCOUNTER — Other Ambulatory Visit: Payer: Medicare Other

## 2019-08-30 ENCOUNTER — Other Ambulatory Visit: Payer: Self-pay

## 2019-08-30 ENCOUNTER — Ambulatory Visit: Payer: Medicare Other | Admitting: Podiatry

## 2019-08-30 ENCOUNTER — Other Ambulatory Visit (INDEPENDENT_AMBULATORY_CARE_PROVIDER_SITE_OTHER): Payer: Medicare PPO

## 2019-08-30 DIAGNOSIS — Z1211 Encounter for screening for malignant neoplasm of colon: Secondary | ICD-10-CM

## 2019-08-30 DIAGNOSIS — Z91018 Allergy to other foods: Secondary | ICD-10-CM | POA: Diagnosis not present

## 2019-08-30 LAB — HEMOCCULT SLIDES (X 3 CARDS)
Fecal Occult Blood: NEGATIVE
OCCULT 1: NEGATIVE
OCCULT 2: NEGATIVE
OCCULT 3: NEGATIVE
OCCULT 4: NEGATIVE
OCCULT 5: NEGATIVE

## 2019-08-30 NOTE — Progress Notes (Signed)
Medical screening examination/treatment/procedure(s) were performed by non-physician practitioner and as supervising physician I was immediately available for consultation/collaboration. I agree with above. Lew Dawes, MD

## 2019-09-08 ENCOUNTER — Ambulatory Visit (INDEPENDENT_AMBULATORY_CARE_PROVIDER_SITE_OTHER): Payer: Medicare PPO | Admitting: *Deleted

## 2019-09-08 ENCOUNTER — Ambulatory Visit: Payer: Medicare Other | Admitting: Podiatry

## 2019-09-08 ENCOUNTER — Other Ambulatory Visit: Payer: Self-pay

## 2019-09-08 ENCOUNTER — Encounter: Payer: Self-pay | Admitting: Podiatry

## 2019-09-08 DIAGNOSIS — M79676 Pain in unspecified toe(s): Secondary | ICD-10-CM | POA: Diagnosis not present

## 2019-09-08 DIAGNOSIS — Z9229 Personal history of other drug therapy: Secondary | ICD-10-CM

## 2019-09-08 DIAGNOSIS — E538 Deficiency of other specified B group vitamins: Secondary | ICD-10-CM

## 2019-09-08 DIAGNOSIS — B351 Tinea unguium: Secondary | ICD-10-CM

## 2019-09-08 DIAGNOSIS — L84 Corns and callosities: Secondary | ICD-10-CM

## 2019-09-08 DIAGNOSIS — M79609 Pain in unspecified limb: Secondary | ICD-10-CM

## 2019-09-08 DIAGNOSIS — M79674 Pain in right toe(s): Secondary | ICD-10-CM

## 2019-09-08 MED ORDER — CYANOCOBALAMIN 1000 MCG/ML IJ SOLN
1000.0000 ug | Freq: Once | INTRAMUSCULAR | Status: AC
  Administered 2019-09-08: 1000 ug via INTRAMUSCULAR

## 2019-09-08 NOTE — Progress Notes (Signed)
pcp is out of the office pls cosign for b12

## 2019-09-08 NOTE — Patient Instructions (Signed)

## 2019-09-14 NOTE — Progress Notes (Signed)
Subjective: Alyssa Luna presents today for follow up of long term blood thinner, Xarelto, and presents today with painful, discolored, thick toenails which interfere with daily activities.   Allergies  Allergen Reactions  . Alpha-Gal Itching  . Beef (Bovine) Protein Other (See Comments)    Alpha-gal allergy testing positive June 2018  . Galactose Nausea Only    Per patient, cannot have alpha-gal since tic bite episode.  . Pravastatin Anaphylaxis and Other (See Comments)    Legs ache  . Acetaminophen Other (See Comments)  . Amlodipine     Leg swelling  . Colchicine Other (See Comments)  . Doxycycline Other (See Comments)    Nausea - able to take w/food  . Shellfish Allergy Other (See Comments)    Unknown  . Tikosyn [Dofetilide] Other (See Comments)    Unknown reaction and was told never to take it due to problems during sleep   . Actonel [Risedronate Sodium] Other (See Comments)    Not known  . Alendronate Other (See Comments)    NOT KNOWN  . Augmentin [Amoxicillin-Pot Clavulanate] Rash    Has patient had a PCN reaction causing immediate rash, facial/tongue/throat swelling, SOB or lightheadedness with hypotension: Yes Has patient had a PCN reaction causing severe rash involving mucus membranes or skin necrosis:No Has patient had a PCN reaction that required hospitalization: No Has patient had a PCN reaction occurring within the last 10 years: No If all of the above answers are "NO", then may proceed with Cephalosporin use.   . Ciprofloxacin Nausea Only  . Evista [Raloxifene] Other (See Comments)    Unknown reaction  . Flagyl [Metronidazole] Other (See Comments)    Not known  . Fosamax [Alendronate Sodium] Other (See Comments)    NOT KNOWN  . Gold-Containing Drug Products Other (See Comments)    Per Dr  . Loma Sousa [Escitalopram Oxalate] Other (See Comments)    shaky  . Risedronate Other (See Comments)    Unknown reaction  . Simvastatin Other (See Comments)   REACTION: leg cramps, weakness  . Tramadol Other (See Comments)    Mental disturbance changes    Objective: There were no vitals filed for this visit.  Vascular Examination:  Capillary refill time to digits immediate b/l, palpable DP pulses b/l, palpable PT pulses b/l, pedal hair present b/l and skin temperature gradient within normal limits b/l  Dermatological Examination: Pedal skin with normal turgor, texture and tone bilaterally, toenails 1-5 b/l elongated, dystrophic, thickened, crumbly with subungual debris and hyperkeratotic lesion(s) right 5th digit.  No erythema, no edema, no drainage, no flocculence  Musculoskeletal: Normal muscle strength 5/5 to all lower extremity muscle groups bilaterally, no pain crepitus or joint limitation noted with ROM b/l and hammertoes noted to the right, 5th toe  Neurological: Protective sensation intact 5/5 intact bilaterally with 10g monofilament b/l and vibratory sensation intact b/l  Assessment: 1. Pain due to onychomycosis of nail   2. Corns   3. Pain of toe of right foot   4. Hx of long term use of blood thinners     Plan: -Toenails 1-5 b/l were debrided in length and girth without iatrogenic bleeding. -corns and calluses were debrided without complication or incident. Total number debrided =1 to right 5th digit -Patient to continue soft, supportive shoe gear daily. -Patient to report any pedal injuries to medical professional immediately. -Patient/POA to call should there be question/concern in the interim.  Return in about 9 weeks (around 11/10/2019) for nail and callus trim.

## 2019-09-21 ENCOUNTER — Ambulatory Visit: Payer: Medicare Other | Admitting: Internal Medicine

## 2019-09-27 DIAGNOSIS — I10 Essential (primary) hypertension: Secondary | ICD-10-CM | POA: Diagnosis not present

## 2019-09-27 DIAGNOSIS — I480 Paroxysmal atrial fibrillation: Secondary | ICD-10-CM | POA: Diagnosis not present

## 2019-09-30 ENCOUNTER — Other Ambulatory Visit: Payer: Self-pay | Admitting: Internal Medicine

## 2019-10-03 DIAGNOSIS — G250 Essential tremor: Secondary | ICD-10-CM | POA: Diagnosis not present

## 2019-10-03 DIAGNOSIS — Z853 Personal history of malignant neoplasm of breast: Secondary | ICD-10-CM | POA: Diagnosis not present

## 2019-10-03 DIAGNOSIS — Z8673 Personal history of transient ischemic attack (TIA), and cerebral infarction without residual deficits: Secondary | ICD-10-CM | POA: Diagnosis not present

## 2019-10-03 DIAGNOSIS — D329 Benign neoplasm of meninges, unspecified: Secondary | ICD-10-CM | POA: Diagnosis not present

## 2019-10-04 ENCOUNTER — Other Ambulatory Visit: Payer: Self-pay

## 2019-10-04 ENCOUNTER — Ambulatory Visit (INDEPENDENT_AMBULATORY_CARE_PROVIDER_SITE_OTHER): Payer: Medicare PPO | Admitting: Specialist

## 2019-10-04 ENCOUNTER — Encounter: Payer: Self-pay | Admitting: Specialist

## 2019-10-04 VITALS — BP 139/80 | HR 74 | Ht 62.0 in | Wt 136.0 lb

## 2019-10-04 DIAGNOSIS — M5136 Other intervertebral disc degeneration, lumbar region: Secondary | ICD-10-CM

## 2019-10-04 DIAGNOSIS — M2170 Unequal limb length (acquired), unspecified site: Secondary | ICD-10-CM | POA: Diagnosis not present

## 2019-10-04 DIAGNOSIS — M8000XA Age-related osteoporosis with current pathological fracture, unspecified site, initial encounter for fracture: Secondary | ICD-10-CM

## 2019-10-04 DIAGNOSIS — M4726 Other spondylosis with radiculopathy, lumbar region: Secondary | ICD-10-CM

## 2019-10-04 DIAGNOSIS — M4125 Other idiopathic scoliosis, thoracolumbar region: Secondary | ICD-10-CM

## 2019-10-04 DIAGNOSIS — M48062 Spinal stenosis, lumbar region with neurogenic claudication: Secondary | ICD-10-CM

## 2019-10-04 DIAGNOSIS — M4156 Other secondary scoliosis, lumbar region: Secondary | ICD-10-CM

## 2019-10-04 NOTE — Patient Instructions (Signed)
Plan:  Avoid bending, stooping and avoid lifting weights greater than 10 lbs. Avoid prolong standing and walking. Avoid frequent bending and stooping  No lifting greater than 10 lbs. May use ice or moist heat for pain. Weight loss is of benefit. Handicap license is approved. Dr. Romona Curls secretary/Assistant will call to arrange for evaluation of spinal cord stimulator, on xarelto and has history of  Osteopenia and multiple kyphoplasties and a kyphoscoliosis centered to the right at L2-3.  Dx is spinal stenosis, symtoms of neurogenic claudication.  Follow-Up Instructions: No follow-ups on file.  Continue with the treatment of osteoporosis to prevent future fractures and to relieve pain due to soft bones. Due to osteoporosis surgical treatment is contraindicated.
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
## 2019-10-05 ENCOUNTER — Ambulatory Visit: Payer: Medicare Other | Admitting: Specialist

## 2019-10-09 ENCOUNTER — Ambulatory Visit: Payer: Medicare PPO

## 2019-10-11 ENCOUNTER — Other Ambulatory Visit: Payer: Self-pay

## 2019-10-11 ENCOUNTER — Ambulatory Visit (INDEPENDENT_AMBULATORY_CARE_PROVIDER_SITE_OTHER): Payer: Medicare PPO | Admitting: *Deleted

## 2019-10-11 DIAGNOSIS — E538 Deficiency of other specified B group vitamins: Secondary | ICD-10-CM | POA: Diagnosis not present

## 2019-10-11 MED ORDER — CYANOCOBALAMIN 1000 MCG/ML IJ SOLN
1000.0000 ug | Freq: Once | INTRAMUSCULAR | Status: AC
  Administered 2019-10-11: 14:00:00 1000 ug via INTRAMUSCULAR

## 2019-10-11 NOTE — Progress Notes (Signed)
Pls cosign for B12 since PCP is out of the office this week../lm,b

## 2019-10-12 ENCOUNTER — Telehealth: Payer: Self-pay | Admitting: Internal Medicine

## 2019-10-12 NOTE — Telephone Encounter (Signed)
Okay  to return my practice

## 2019-10-12 NOTE — Telephone Encounter (Signed)
Dr. Hilarie Fredrickson, Tammi Klippel, NP from [facility] referred pt for anemia.  Pt recently saw [facility] 07/18/2019 but would prefer medical care to stay within [place].    Pt recently lost her husband and sold her house, so she was going through some transitioning when she saw [facility].    Sharyn Lull stated that pt had labs 09/27/19 (in [place]).  Please review and advise whether you will accept her back as a patient.  Michelle's pager is [phone].

## 2019-10-17 ENCOUNTER — Ambulatory Visit: Payer: Medicare PPO | Admitting: Internal Medicine

## 2019-10-17 ENCOUNTER — Other Ambulatory Visit: Payer: Self-pay

## 2019-10-17 ENCOUNTER — Encounter: Payer: Self-pay | Admitting: Internal Medicine

## 2019-10-17 VITALS — BP 132/82 | HR 72 | Temp 98.1°F | Ht 62.0 in | Wt 131.0 lb

## 2019-10-17 DIAGNOSIS — I4891 Unspecified atrial fibrillation: Secondary | ICD-10-CM | POA: Diagnosis not present

## 2019-10-17 DIAGNOSIS — D649 Anemia, unspecified: Secondary | ICD-10-CM | POA: Diagnosis not present

## 2019-10-17 DIAGNOSIS — E559 Vitamin D deficiency, unspecified: Secondary | ICD-10-CM

## 2019-10-17 DIAGNOSIS — F413 Other mixed anxiety disorders: Secondary | ICD-10-CM

## 2019-10-17 DIAGNOSIS — G459 Transient cerebral ischemic attack, unspecified: Secondary | ICD-10-CM | POA: Diagnosis not present

## 2019-10-17 DIAGNOSIS — E538 Deficiency of other specified B group vitamins: Secondary | ICD-10-CM | POA: Diagnosis not present

## 2019-10-17 LAB — T4, FREE: Free T4: 1.11 ng/dL (ref 0.60–1.60)

## 2019-10-17 LAB — CBC WITH DIFFERENTIAL/PLATELET
Basophils Absolute: 0.1 10*3/uL (ref 0.0–0.1)
Basophils Relative: 1.8 % (ref 0.0–3.0)
Eosinophils Absolute: 0.1 10*3/uL (ref 0.0–0.7)
Eosinophils Relative: 2.5 % (ref 0.0–5.0)
HCT: 35.8 % — ABNORMAL LOW (ref 36.0–46.0)
Hemoglobin: 11.6 g/dL — ABNORMAL LOW (ref 12.0–15.0)
Lymphocytes Relative: 45.2 % (ref 12.0–46.0)
Lymphs Abs: 1.3 10*3/uL (ref 0.7–4.0)
MCHC: 32.4 g/dL (ref 30.0–36.0)
MCV: 83.1 fl (ref 78.0–100.0)
Monocytes Absolute: 0.3 10*3/uL (ref 0.1–1.0)
Monocytes Relative: 9.9 % (ref 3.0–12.0)
Neutro Abs: 1.2 10*3/uL — ABNORMAL LOW (ref 1.4–7.7)
Neutrophils Relative %: 40.6 % — ABNORMAL LOW (ref 43.0–77.0)
Platelets: 187 10*3/uL (ref 150.0–400.0)
RBC: 4.31 Mil/uL (ref 3.87–5.11)
RDW: 16.6 % — ABNORMAL HIGH (ref 11.5–15.5)
WBC: 3 10*3/uL — ABNORMAL LOW (ref 4.0–10.5)

## 2019-10-17 LAB — BASIC METABOLIC PANEL
BUN: 9 mg/dL (ref 6–23)
CO2: 29 mEq/L (ref 19–32)
Calcium: 9.3 mg/dL (ref 8.4–10.5)
Chloride: 105 mEq/L (ref 96–112)
Creatinine, Ser: 0.74 mg/dL (ref 0.40–1.20)
GFR: 74.75 mL/min (ref >60.00)
Glucose, Bld: 82 mg/dL (ref 70–99)
Potassium: 4 mEq/L (ref 3.5–5.1)
Sodium: 141 mEq/L (ref 135–145)

## 2019-10-17 LAB — TSH: TSH: 2.26 u[IU]/mL (ref 0.35–4.50)

## 2019-10-17 MED ORDER — LORAZEPAM 0.5 MG PO TABS: 0.5000 mg | ORAL_TABLET | Freq: Two times a day (BID) | ORAL | 1 refills | Status: DC | PRN

## 2019-10-17 MED ORDER — LEVOTHYROXINE SODIUM 25 MCG PO TABS: 25.0000 ug | ORAL_TABLET | Freq: Every day | ORAL | 3 refills | Status: DC

## 2019-10-17 NOTE — Assessment & Plan Note (Signed)
Xarelto

## 2019-10-17 NOTE — Assessment & Plan Note (Signed)
Vit D 

## 2019-10-17 NOTE — Progress Notes (Signed)
Subjective:  Patient ID: Alyssa Luna, female    DOB: Sep 13, 1933  Age: 86 y.o. MRN: [id number]  CC: No chief complaint on file.   HPI Alyssa Luna presents for anxiety, osteoporosis, GERD f/u C/o anemia - new; appt w/Dr Hilarie Fredrickson on Fri pending She had 2 teeth extracted  Outpatient Medications Prior to Visit  Medication Sig Dispense Refill  . acetaminophen (TYLENOL) 325 MG tablet Take by mouth.    [name] alum & mag hydroxide-simeth (MAALOX/MYLANTA) 200-200-20 MG/5ML suspension Take by mouth.    [name] amiodarone (PACERONE) 200 MG tablet Take 200 mg by mouth daily.    . cetirizine (ZYRTEC) 10 MG tablet Take 10 mg by mouth daily as needed for allergies.    . chlorhexidine (PERIDEX) 0.12 % solution PLEASE SEE ATTACHED FOR DETAILED DIRECTIONS    . Cholecalciferol (D3 DOTS) 2000 units TBDP Take by mouth daily.    . Cyanocobalamin (VITAMIN B-12 IJ) Inject as directed every 30 (thirty) days.    [name] denosumab (PROLIA) 60 MG/ML SOLN injection Inject 60 mg into the skin every 6 (six) months. Administer in upper arm, thigh, or abdomen Unable to tolerate oral meds Dx severe osteoporosis 1.8 mL 6  . diclofenac sodium (VOLTAREN) 1 % GEL Apply topically.    . diphenhydrAMINE HCl (BENADRYL ALLERGY CHILDRENS PO) Take by mouth at bedtime as needed. Takes at night when itching    . erythromycin (ERY-TAB) 500 MG EC tablet Take 1 tablet (500 mg total) by mouth 3 (three) times daily. 30 tablet 0  . famotidine (PEPCID) 20 MG tablet Take 1 tablet (20 mg total) by mouth 2 (two) times daily. 180 tablet 3  . ferrous sulfate 325 (65 FE) MG tablet Take 1 tablet (325 mg total) by mouth daily. 90 tablet 0  . furosemide (LASIX) 20 MG tablet TAKE 1 TABLET (20 MG TOTAL) BY MOUTH DAILY AS NEEDED FOR EDEMA. 90 tablet 1  . gabapentin (NEURONTIN) 600 MG tablet TAKE 1/2 TABLET BY MOUTH 4 TIMES A DAY    . gentamicin ointment (GARAMYCIN) 0.1 % Apply 1 application topically 3 (three) times daily. 15 g 0  . hydrocortisone 2.5 %  cream Apply 1 application topically 3 (three) times daily as needed (itching).    [name] ibuprofen (ADVIL) 200 MG tablet Take by mouth.    . levothyroxine (SYNTHROID) 25 MCG tablet Take 1 tablet (25 mcg total) by mouth daily before breakfast. 90 tablet 1  . LORazepam (ATIVAN) 0.5 MG tablet TAKE 1-2 TABLETS (0.5-1 MG TOTAL) BY MOUTH 2 (TWO) TIMES DAILY AS NEEDED FOR ANXIETY OR SLEEP. 60 tablet 3  . montelukast (SINGULAIR) 10 MG tablet TAKE 1 TABLET BY MOUTH EVERY DAY 90 tablet 1  . mupirocin ointment (BACTROBAN) 2 % Apply once daily to ankle wound 22 g 0  . pantoprazole (PROTONIX) 40 MG tablet Take 1 tablet (40 mg total) by mouth daily. 30 tablet 11  . potassium chloride (K-DUR) 10 MEQ tablet TAKE 1 TABLET (10 MEQ TOTAL) BY MOUTH DAILY. TAKE WHILE ON FUROSEMIDE 90 tablet 1  . Rivaroxaban (XARELTO) 15 MG TABS tablet Take 15 mg by mouth daily.     . Simethicone (GAS-X PO) Take 1 tablet by mouth daily as needed (flatulence).     [name] tiZANidine (ZANAFLEX) 2 MG tablet Take 1 tablet (2 mg total) by mouth every 6 (six) hours as needed for muscle spasms. 30 tablet 0  . Wheat Dextrin (BENEFIBER PO) Take 1 tablet by mouth daily.    [name] amoxicillin (  AMOXIL) 500 MG tablet TAKE 1 TABLET BY MOUTH EVERY 8 HOURS UNTIL FINISHED    . azithromycin (ZITHROMAX) 250 MG tablet TAKE 2 TABLETS BY MOUTH EVERY DAY FOR 3 DAYS    . predniSONE (STERAPRED UNI-PAK 48 TAB) 5 MG (48) TBPK tablet See admin instructions. see package    . sulfamethoxazole-trimethoprim (BACTRIM DS) 800-160 MG tablet TAKE 1 TABLET (160 MG OF TRIMETHOPRIM TOTAL) BY MOUTH ONCE DAILY FOR 14 DAYS     No facility-administered medications prior to visit.    ROS: Review of Systems  Constitutional: Positive for fatigue. Negative for activity change, appetite change, chills and unexpected weight change.  HENT: Negative for congestion, mouth sores and sinus pressure.   Eyes: Negative for visual disturbance.  Respiratory: Negative for cough and chest tightness.    Gastrointestinal: Negative for abdominal pain and nausea.  Genitourinary: Negative for difficulty urinating, frequency and vaginal pain.  Musculoskeletal: Positive for arthralgias, back pain and gait problem.  Skin: Negative for pallor and rash.  Neurological: Negative for dizziness, tremors, weakness, numbness and headaches.  Psychiatric/Behavioral: Negative for confusion and sleep disturbance.    Objective:  BP 132/82 (BP Location: Left Arm, Patient Position: Sitting, Cuff Size: Normal)   Pulse 72   Temp 98.1 F (36.7 C) (Oral)   Ht 5' 2"  (1.575 m)   Wt 131 lb (59.4 kg)   SpO2 98%   BMI 23.96 kg/m   BP Readings from Last 3 Encounters:  10/17/19 132/82  10/04/19 139/80  07/19/19 134/76    Wt Readings from Last 3 Encounters:  10/17/19 131 lb (59.4 kg)  10/04/19 136 lb (61.7 kg)  07/19/19 132 lb (59.9 kg)    Physical Exam Constitutional:      General: She is not in acute distress.    Appearance: She is well-developed.  HENT:     Head: Normocephalic.     Right Ear: External ear normal.     Left Ear: External ear normal.     Nose: Nose normal.  Eyes:     General:        Right eye: No discharge.        Left eye: No discharge.     Conjunctiva/sclera: Conjunctivae normal.     Pupils: Pupils are equal, round, and reactive to light.  Neck:     Thyroid: No thyromegaly.     Vascular: No JVD.     Trachea: No tracheal deviation.  Cardiovascular:     Rate and Rhythm: Normal rate. Rhythm irregular.     Heart sounds: Normal heart sounds.  Pulmonary:     Effort: No respiratory distress.     Breath sounds: No stridor. No wheezing.  Abdominal:     General: Bowel sounds are normal. There is no distension.     Palpations: Abdomen is soft. There is no mass.     Tenderness: There is no abdominal tenderness. There is no guarding or rebound.  Musculoskeletal:        General: No tenderness.     Cervical back: Normal range of motion and neck supple.  Lymphadenopathy:      Cervical: No cervical adenopathy.  Skin:    Findings: No erythema or rash.  Neurological:     Cranial Nerves: No cranial nerve deficit.     Motor: No abnormal muscle tone.     Coordination: Coordination normal.     Deep Tendon Reflexes: Reflexes normal.  Psychiatric:        Behavior: Behavior normal.  Thought Content: Thought content normal.        Judgment: Judgment normal.     Lab Results  Component Value Date   WBC 3.4 (L) 07/19/2019   HGB 10.3 (L) 07/19/2019   HCT 32.5 (L) 07/19/2019   PLT 195.0 07/19/2019   GLUCOSE 74 04/26/2019   CHOL 160 02/25/2018   TRIG 119.0 02/25/2018   HDL 42.90 02/25/2018   LDLDIRECT 178.0 09/24/2011   LDLCALC 94 02/25/2018   ALT 44 (H) 04/26/2019   AST 64 (H) 04/26/2019   NA 140 04/26/2019   K 4.3 04/26/2019   CL 105 04/26/2019   CREATININE 0.76 04/26/2019   BUN 15 04/26/2019   CO2 29 04/26/2019   TSH 11.91 (H) 12/20/2018   INR 1.3 (H) 02/07/2019    IR KYPHO LUMBAR INC FX REDUCE BONE BX UNI/BIL CANNULATION INC/IMAGING  Result Date: 02/08/2019 INDICATION: Severe low back pain secondary to compression fractures at T10 and at L2. EXAM: BALLOON KYPHOPLASTY AT T10 AND AT L2. COMPARISON:  MRI MRA of the brain of a few weeks ago. MEDICATIONS: As antibiotic prophylaxis, Ancef 2 g IV was ordered pre-procedure and administered intravenously within 1 hour of incision. ANESTHESIA/SEDATION: Moderate (conscious) sedation was employed during this procedure. A total of Versed 2.5 mg and Fentanyl 75 mcg was administered intravenously. Moderate Sedation Time: 54 minutes. The patient's level of consciousness and vital signs were monitored continuously by radiology nursing throughout the procedure under my direct supervision. FLUOROSCOPY TIME:  Fluoroscopy Time: 21 minutes 6 seconds (8,563 mGy) COMPLICATIONS: None immediate. PROCEDURE: Following a full explanation of the procedure along with the potential associated complications, an informed witnessed  consent was obtained. The patient was placed prone on the fluoroscopic table. The skin overlying the thoracolumbar region was then prepped and draped in the usual sterile fashion. The right pedicle at T10, and both pedicles at L2 were then infiltrated with 0.25% bupivacaine, followed by the advancement of an 11-gauge Jamshidi needle through the right pedicle of T10, and both pedicles at L2 into the posterior one-third at these levels. These were then exchanged for a Kyphon advanced osteo introducer system comprised of a working cannula and a Kyphon osteo drill in the 3 pedicles. The combinations were then advanced over a Kyphon osteo bone pin until the tip of the Kyphon osteo drill was in the posterior third at T10 and at L2. At this time, the bone pins were removed. In a medial trajectory, the combination was advanced until the tip of the working cannula was inside the posterior one-third at T10, and at L2. The osteo drills were removed and a core samples sent for pathologic analysis. Through each working cannulae, a Kyphon bone biopsy device was advanced to within 5 mm of the anterior aspect of T10 and L2. Core samples from these were also sent for pathologic analysis. Through the working cannulae, a Kyphon inflatable bone tamp 20 x 3 was advanced and positioned with the distal marker 5 mm from the anterior aspect of T10 and L2. Crossing of the midline was seen on the AP projection. At this time, the balloons were expanded using contrast via a Kyphon inflation syringe device via microtubing. Inflations were continued until there was apposition with the superior and the inferior endplates into L2, and the inferior endplate at J49. At this time, methylmethacrylate mixture was reconstituted with Tobramycin in the Kyphon bone mixing device system. This was then loaded onto the Kyphon bone fillers. The balloons were deflated and removed followed by the  instillation of 4-1/2 bone filler equivalents of methylmethacrylate  mixture at L2, and 2 bone filler equivalent of microcatheter mixture at T10 with excellent filling in the AP and lateral projections. No extravasation was noted in the disk spaces or posteriorly into the spinal canal. No epidural venous contamination was seen. The working cannulae and the bone fillers were then retrieved and removed. Hemostasis was achieved at the skin entry sites. The patient tolerated the procedure well. IMPRESSION: 1. Status post fluoroscopic-guided needle placement for deep core bone biopsy at T10 and L2. 2. Status post vertebral body augmentation using balloon kyphoplasty at T10 and L2 as described without event. 3. Patient advised to check with her referring MD for results of the biopsy obtained as per the request of her referring MD. Electronically Signed   By: Luanne Bras M.D.   On: 02/07/2019 14:46   IR KYPHO EA ADDL LEVEL THORACIC OR LUMBAR  Result Date: 02/08/2019 INDICATION: Severe low back pain secondary to compression fractures at T10 and at L2. EXAM: BALLOON KYPHOPLASTY AT T10 AND AT L2. COMPARISON:  MRI MRA of the brain of a few weeks ago. MEDICATIONS: As antibiotic prophylaxis, Ancef 2 g IV was ordered pre-procedure and administered intravenously within 1 hour of incision. ANESTHESIA/SEDATION: Moderate (conscious) sedation was employed during this procedure. A total of Versed 2.5 mg and Fentanyl 75 mcg was administered intravenously. Moderate Sedation Time: 54 minutes. The patient's level of consciousness and vital signs were monitored continuously by radiology nursing throughout the procedure under my direct supervision. FLUOROSCOPY TIME:  Fluoroscopy Time: 21 minutes 6 seconds (9,323 mGy) COMPLICATIONS: None immediate. PROCEDURE: Following a full explanation of the procedure along with the potential associated complications, an informed witnessed consent was obtained. The patient was placed prone on the fluoroscopic table. The skin overlying the thoracolumbar region was  then prepped and draped in the usual sterile fashion. The right pedicle at T10, and both pedicles at L2 were then infiltrated with 0.25% bupivacaine, followed by the advancement of an 11-gauge Jamshidi needle through the right pedicle of T10, and both pedicles at L2 into the posterior one-third at these levels. These were then exchanged for a Kyphon advanced osteo introducer system comprised of a working cannula and a Kyphon osteo drill in the 3 pedicles. The combinations were then advanced over a Kyphon osteo bone pin until the tip of the Kyphon osteo drill was in the posterior third at T10 and at L2. At this time, the bone pins were removed. In a medial trajectory, the combination was advanced until the tip of the working cannula was inside the posterior one-third at T10, and at L2. The osteo drills were removed and a core samples sent for pathologic analysis. Through each working cannulae, a Kyphon bone biopsy device was advanced to within 5 mm of the anterior aspect of T10 and L2. Core samples from these were also sent for pathologic analysis. Through the working cannulae, a Kyphon inflatable bone tamp 20 x 3 was advanced and positioned with the distal marker 5 mm from the anterior aspect of T10 and L2. Crossing of the midline was seen on the AP projection. At this time, the balloons were expanded using contrast via a Kyphon inflation syringe device via microtubing. Inflations were continued until there was apposition with the superior and the inferior endplates into L2, and the inferior endplate at F57. At this time, methylmethacrylate mixture was reconstituted with Tobramycin in the Kyphon bone mixing device system. This was then loaded onto the [facility]  bone fillers. The balloons were deflated and removed followed by the instillation of 4-1/2 bone filler equivalents of methylmethacrylate mixture at L2, and 2 bone filler equivalent of microcatheter mixture at T10 with excellent filling in the AP and lateral  projections. No extravasation was noted in the disk spaces or posteriorly into the spinal canal. No epidural venous contamination was seen. The working cannulae and the bone fillers were then retrieved and removed. Hemostasis was achieved at the skin entry sites. The patient tolerated the procedure well. IMPRESSION: 1. Status post fluoroscopic-guided needle placement for deep core bone biopsy at T10 and L2. 2. Status post vertebral body augmentation using balloon kyphoplasty at T10 and L2 as described without event. 3. Patient advised to check with her referring MD for results of the biopsy obtained as per the request of her referring MD. Electronically Signed   By: Luanne Bras M.D.   On: 02/07/2019 14:46    Assessment & Plan:   There are no diagnoses linked to this encounter.   No orders of the defined types were placed in this encounter.    Follow-up: No follow-ups on file.  Walker Kehr, MD

## 2019-10-17 NOTE — Assessment & Plan Note (Signed)
Lorazepam prn - may need a PA - I would rather not change the Rx  Potential benefits of a long term benzodiazepines  use as well as potential risks  and complications were explained to the patient and were aknowledged.

## 2019-10-17 NOTE — Assessment & Plan Note (Signed)
[place]

## 2019-10-17 NOTE — Assessment & Plan Note (Signed)
On b12

## 2019-10-19 LAB — TEST AUTHORIZATION

## 2019-10-19 LAB — IRON, TOTAL/TOTAL IRON BINDING CAP
%SAT: 10 % (calc) — ABNORMAL LOW (ref 16–45)
Iron: 36 ug/dL — ABNORMAL LOW (ref 45–160)
TIBC: 353 mcg/dL (calc) (ref 250–450)

## 2019-10-19 LAB — FERRITIN: Ferritin: 19 ng/mL (ref 16–288)

## 2019-10-20 ENCOUNTER — Other Ambulatory Visit: Payer: Self-pay | Admitting: Internal Medicine

## 2019-10-20 ENCOUNTER — Encounter: Payer: Self-pay | Admitting: Nurse Practitioner

## 2019-10-20 ENCOUNTER — Other Ambulatory Visit (INDEPENDENT_AMBULATORY_CARE_PROVIDER_SITE_OTHER): Payer: Medicare PPO

## 2019-10-20 ENCOUNTER — Ambulatory Visit: Payer: Medicare PPO | Admitting: Nurse Practitioner

## 2019-10-20 ENCOUNTER — Telehealth: Payer: Self-pay | Admitting: Emergency Medicine

## 2019-10-20 VITALS — BP 138/80 | HR 68 | Temp 97.6°F | Ht 62.0 in | Wt 132.0 lb

## 2019-10-20 DIAGNOSIS — D649 Anemia, unspecified: Secondary | ICD-10-CM

## 2019-10-20 DIAGNOSIS — R110 Nausea: Secondary | ICD-10-CM | POA: Diagnosis not present

## 2019-10-20 DIAGNOSIS — R1032 Left lower quadrant pain: Secondary | ICD-10-CM | POA: Diagnosis not present

## 2019-10-20 DIAGNOSIS — K5909 Other constipation: Secondary | ICD-10-CM | POA: Diagnosis not present

## 2019-10-20 DIAGNOSIS — G8929 Other chronic pain: Secondary | ICD-10-CM

## 2019-10-20 LAB — FERRITIN: Ferritin: 16.5 ng/mL (ref 10.0–291.0)

## 2019-10-20 NOTE — Telephone Encounter (Signed)
   RYLI STANDLEE 05/24/1934 [id number]  Dear Dr. Leonides Schanz:  We have scheduled the above named patient for an endoscopy procedure. Our records show that she is on anticoagulation therapy.  Please advise as to whether the patient may come off their therapy of Xarelto 2 days prior to their procedure which is scheduled for 10-26-19.  Please route your response to Tinnie Gens, CMA or fax response to [phone].  Sincerely,    [facility]

## 2019-10-20 NOTE — Progress Notes (Signed)
ASSESSMENT / PLAN:   Alyssa Luna is a 86 y.o. female with a pmh significant for, but not necessarily limited to, Afib on Xarelto, GERD, hiatal hernia, esophageal dysmotility , severe  diverticulosis, history of ischemic colitis , alpha gal allergy   # [place] Anemia, mild. Reviewed labs in [facility].  --Hgb 11.1, down from baseline of ~ 12-13. TIBC normal but with only 6 percent saturation.  --normal TSH of 2.3 --hemoccult negative x 6 in January. No overt bleeding. --Iron deficient?. Will obtain ferritin. Scheduling for EGD for nausea. If ferritin suggests iron deficiency then we can further discuss adding colonoscopy on to EGD given her need for chronic anticoagulation. However,  given advanced age she is at increased for procedures. Furthermore, patient hesitant to proceed with colonoscopy as her mother sustained a perforation during one.  # Chronic anticoagulation, on Xarelto --Hold Xarelto for 2 days before procedure - will instruct when and how to resume after procedure. Patient understands that there is a low but real risk of cardiovascular event such as heart attack, stroke, or embolism /  thrombosis while off blood thinner. The patient consents to proceed. Will communicate by phone or EMR with patient's prescribing provider to confirm that holding Xarelto is reasonable in this case.    # Nausea without vomiting or weight loss --Nausea affecting quality of life.  --Present for 2-3 months --Will proceed with EGD in light of nausea and new mild anemia. The risks and benefits of EGD were discussed and the patient agrees to proceed.   # Chronic LLQ pain  -Occurs when constipated. Better after BM.  -Two negative CT scan w.cContrast in 2016 and 2017.   # Chronic constipation -mangages with daily Metamucil.   # Alpha Gal allergy.  -Avoids beef or consumption of any "animal with 4 legs"  HPI:     Chief Complaint:  None.    ** History comes from chart and  patient  Patient is an 86 year old female known to Dr. Hilarie Fredrickson, seen last in April 2020 via telemedicine visit.  She is being seen now at the request of Tammi Klippel, NP with [facility] for evaluation of anemia.  Patient's husband passed away last year, she has been easily handling their estate.  She moved to [place]. In early December patient was found to be anemic after complaints of fatigue.  Hgb was 10.4 [facility]).  Baseline hemoglobin 11-12 . She was started on iron but developed constipation and fatigue didn't improve so stopped it. Repeat hgb early February was 11.1.  Retried iron a couple of weeks ago but made her nauseated, though she was taking amoxicillin at the same time for dental purposes.  She has been followed by liver clinic at [facility] for abnormal liver test felt to be secondary to amiodarone.  Since patient is trying to relocate to [facility] she was sent back to [place] for anemia work-up    Ms. Denunzio has not seen any blood in her stool.  Occult negative x6.  She does not use NSAIDs.  She does give a 2 to 37-monthhistory of nausea without vomiting.  She cannot correlate nausea with any medication changes.    Past Medical History:  Diagnosis Date  . AF (atrial fibrillation) ([place]   . Anxiety    has lorazepam on hand for nervousness, pt. reports that she had a break-in to her home on 05/2014  . Atrial  fibrillation ([place])    D [place]  . Breast cancer ([place]) 1984  . Cataract   . Colon polyps   . Coronary artery disease   . Diverticulosis   . Diverticulosis of colon   . Dysrhythmia    atrial fib  . Esophageal dysmotility   . Familial tremor   . GERD (gastroesophageal reflux disease)   . Hemorrhoids   . Hiatal hernia   . History of breast cancer   . History of hiatal hernia   . History of ischemic colitis   . Hyperlipidemia   . Hypertension   . Internal hemorrhoids   . LBP (low back pain)   . Meningioma ([place])   . Neuromuscular disorder ([place])    essential tremor   . Osteoarthritis    hands & back & knees   . Osteoporosis   . Primary localized osteoarthritis of left knee   . Renal cyst   . [facility] spotted fever   . Schatzki's ring   . Scoliosis   . Stroke ([place])   . TIA (transient ischemic attack)   . TR (tricuspid regurgitation)    Mild  . Vitamin B12 deficiency   . Vitamin D deficiency      Past Surgical History:  Procedure Laterality Date  . AUGMENTATION MAMMAPLASTY    . BLEPHAROPLASTY Bilateral   . CATARACT EXTRACTION, BILATERAL    . COLONOSCOPY    . IR KYPHO EA ADDL LEVEL THORACIC OR LUMBAR  02/07/2019  . IR KYPHO LUMBAR INC FX REDUCE BONE BX UNI/BIL CANNULATION INC/IMAGING  02/07/2019  . JOINT REPLACEMENT Right   . kytoplasy    . MASTECTOMY Bilateral 1984  . OOPHORECTOMY     BSO  . POLYPECTOMY    . TOTAL KNEE ARTHROPLASTY  Dec 2011   Right - Dr Noemi Chapel  . TOTAL KNEE ARTHROPLASTY Left 08/27/2014   Procedure: LEFT TOTAL KNEE ARTHROPLASTY;  Surgeon: Lorn Junes, MD;  Location: [facility];  Service: Orthopedics;  Laterality: Left;  [name] VAGINAL HYSTERECTOMY     LAVH BSO   Family History  Problem Relation Age of Onset  . Breast cancer Mother 42  . Tremor Mother   . Tremor Brother   . Colon cancer Maternal Aunt   . Ovarian cancer Maternal Grandmother   . Early death Neg Hx   . Stroke Neg Hx   . Esophageal cancer Neg Hx   . Stomach cancer Neg Hx   . Pancreatic cancer Neg Hx   . Liver disease Neg Hx   . Inflammatory bowel disease Neg Hx    Social History   Tobacco Use  . Smoking status: Never Smoker  . Smokeless tobacco: Never Used  Substance Use Topics  . Alcohol use: No    Alcohol/week: 0.0 standard drinks  . Drug use: No   Current Outpatient Medications  Medication Sig Dispense Refill  . acetaminophen (TYLENOL) 325 MG tablet Take by mouth.    [name] alum & mag hydroxide-simeth (MAALOX/MYLANTA) 200-200-20 MG/5ML suspension Take by mouth.    . cetirizine (ZYRTEC) 10 MG tablet Take 10 mg by mouth daily as needed for  allergies.    . chlorhexidine (PERIDEX) 0.12 % solution PLEASE SEE ATTACHED FOR DETAILED DIRECTIONS    . Cholecalciferol (D3 DOTS) 2000 units TBDP Take by mouth daily.    . Cyanocobalamin (VITAMIN B-12 IJ) Inject as directed every 30 (thirty) days.    [name] denosumab (PROLIA) 60 MG/ML SOLN injection Inject 60 mg into the skin every 6 (six)  months. Administer in upper arm, thigh, or abdomen Unable to tolerate oral meds Dx severe osteoporosis 1.8 mL 6  . diphenhydrAMINE HCl (BENADRYL ALLERGY CHILDRENS PO) Take by mouth at bedtime as needed. Takes at night when itching    . erythromycin (ERY-TAB) 500 MG EC tablet Take 1 tablet (500 mg total) by mouth 3 (three) times daily. 30 tablet 0  . famotidine (PEPCID) 20 MG tablet Take 1 tablet (20 mg total) by mouth 2 (two) times daily. 180 tablet 3  . ferrous sulfate 325 (65 FE) MG tablet TAKE 1 TABLET BY MOUTH EVERY DAY 90 tablet 3  . gabapentin (NEURONTIN) 600 MG tablet TAKE 1/2 TABLET BY MOUTH 4 TIMES A DAY    . levothyroxine (SYNTHROID) 25 MCG tablet Take 1 tablet (25 mcg total) by mouth daily before breakfast. 90 tablet 3  . LORazepam (ATIVAN) 0.5 MG tablet Take 1-2 tablets (0.5-1 mg total) by mouth 2 (two) times daily as needed for anxiety or sleep. Three months prescription 180 tablet 1  . montelukast (SINGULAIR) 10 MG tablet TAKE 1 TABLET BY MOUTH EVERY DAY 90 tablet 1  . Rivaroxaban (XARELTO) 15 MG TABS tablet Take 15 mg by mouth daily.     . Simethicone (GAS-X PO) Take 1 tablet by mouth daily as needed (flatulence).     [name] tiZANidine (ZANAFLEX) 2 MG tablet Take 1 tablet (2 mg total) by mouth every 6 (six) hours as needed for muscle spasms. 30 tablet 0  . Wheat Dextrin (BENEFIBER PO) Take 1 tablet by mouth daily.     No current facility-administered medications for this visit.   Allergies  Allergen Reactions  . Alpha-Gal Itching  . Beef (Bovine) Protein Other (See Comments)    Alpha-gal allergy testing positive June 2018  . Galactose Nausea Only      Per patient, cannot have alpha-gal since tic bite episode.  . Pravastatin Anaphylaxis and Other (See Comments)    Legs ache  . Acetaminophen Other (See Comments)  . Amlodipine     Leg swelling  . Colchicine Other (See Comments)  . Doxycycline Other (See Comments)    Nausea - able to take w/food  . Shellfish Allergy Other (See Comments)    Unknown  . Tikosyn [Dofetilide] Other (See Comments)    Unknown reaction and was told never to take it due to problems during sleep   . Actonel [Risedronate Sodium] Other (See Comments)    Not known  . Alendronate Other (See Comments)    NOT KNOWN  . Augmentin [Amoxicillin-Pot Clavulanate] Rash    Has patient had a PCN reaction causing immediate rash, facial/tongue/throat swelling, SOB or lightheadedness with hypotension: Yes Has patient had a PCN reaction causing severe rash involving mucus membranes or skin necrosis:No Has patient had a PCN reaction that required hospitalization: No Has patient had a PCN reaction occurring within the last 10 years: No If all of the above answers are "NO", then may proceed with Cephalosporin use.   . Ciprofloxacin Nausea Only  . Evista [Raloxifene] Other (See Comments)    Unknown reaction  . Flagyl [Metronidazole] Other (See Comments)    Not known  . Fosamax [Alendronate Sodium] Other (See Comments)    NOT KNOWN  . Gold-Containing Drug Products Other (See Comments)    Per Dr  . Loma Sousa [Escitalopram Oxalate] Other (See Comments)    shaky  . Risedronate Other (See Comments)    Unknown reaction  . Simvastatin Other (See Comments)    REACTION: leg cramps,  weakness  . Tramadol Other (See Comments)    Mental disturbance changes     Review of Systems: Positive for hoarse voice, no chest pain, no SOB.   Serum creatinine: 0.74 mg/dL 10/17/19 1118 Estimated creatinine clearance: 41.4 mL/min   Physical Exam:    Wt Readings from Last 3 Encounters:  10/20/19 132 lb (59.9 kg)  10/17/19 131 lb (59.4 kg)   10/04/19 136 lb (61.7 kg)    BP 138/80   Pulse 68   Temp 97.6 F (36.4 C)   Ht 5' 2"  (1.575 m)   Wt 132 lb (59.9 kg)   BMI 24.14 kg/m  Constitutional:  Pleasant female in no acute distress. Psychiatric: Normal mood and affect. Behavior is normal. EENT: Pupils normal.  Conjunctivae are normal. No scleral icterus. Neck supple.  Cardiovascular: Normal rate, regular rhythm. No edema Pulmonary/chest: Effort normal and breath sounds normal. No wheezing, rales or rhonchi. Abdominal: Soft, nondistended, nontender. Bowel sounds active throughout. There are no masses palpable. No hepatomegaly. Neurological: Alert and oriented to person place and time. Skin: Skin is warm and dry. No rashes noted.  Tye Savoy, NP  10/20/2019, 1:49 PM  Cc:  Referring Provider Plotnikov, Evie Lacks, MD

## 2019-10-20 NOTE — Patient Instructions (Addendum)
Your provider has requested that you go to the basement level for lab work before leaving today. Press "B" on the elevator. The lab is located at the first door on the left as you exit the elevator.  It has been recommended to you by your physician that you have a(n) endoscopy completed. Per your request, we did not schedule the procedure(s) today. Please contact our office at [phone] should you decide to have the procedure completed. You will be scheduled for a pre-visit and procedure at that time.

## 2019-10-23 NOTE — Progress Notes (Signed)
Addendum: Reviewed and agree with assessment and management plan. Tytiana Coles M, MD  

## 2019-10-23 NOTE — Telephone Encounter (Signed)
Pt stated that she was instructed to call you back today.

## 2019-10-23 NOTE — Telephone Encounter (Signed)
Spoke with patient she would like to schedule her EGD for 11/21/19 but call her if there are any cancellations. I informed her we are still waiting to get clearance from her doctor for the [facility]. Once we have that clearance I will call her if there are any cancellations. She states she is in an assisted living facility and states that she has had her covid vaccinations she just misplaced the card they gave her. She states the facility will have the information if we need it

## 2019-10-25 ENCOUNTER — Other Ambulatory Visit: Payer: Self-pay

## 2019-10-25 DIAGNOSIS — D509 Iron deficiency anemia, unspecified: Secondary | ICD-10-CM

## 2019-10-26 ENCOUNTER — Other Ambulatory Visit: Payer: Self-pay

## 2019-10-26 DIAGNOSIS — D649 Anemia, unspecified: Secondary | ICD-10-CM

## 2019-10-27 DIAGNOSIS — M79605 Pain in left leg: Secondary | ICD-10-CM | POA: Diagnosis not present

## 2019-10-27 DIAGNOSIS — G603 Idiopathic progressive neuropathy: Secondary | ICD-10-CM | POA: Diagnosis not present

## 2019-10-27 DIAGNOSIS — G250 Essential tremor: Secondary | ICD-10-CM | POA: Diagnosis not present

## 2019-10-27 DIAGNOSIS — M79604 Pain in right leg: Secondary | ICD-10-CM | POA: Diagnosis not present

## 2019-10-27 DIAGNOSIS — R202 Paresthesia of skin: Secondary | ICD-10-CM | POA: Diagnosis not present

## 2019-10-27 DIAGNOSIS — D329 Benign neoplasm of meninges, unspecified: Secondary | ICD-10-CM | POA: Diagnosis not present

## 2019-10-27 DIAGNOSIS — R200 Anesthesia of skin: Secondary | ICD-10-CM | POA: Diagnosis not present

## 2019-10-27 DIAGNOSIS — Z8673 Personal history of transient ischemic attack (TIA), and cerebral infarction without residual deficits: Secondary | ICD-10-CM | POA: Diagnosis not present

## 2019-10-27 NOTE — Telephone Encounter (Signed)
Left message with Dr. Guido Sander office to call and make sure they received clearance letter. They took a verbal message and will send it to Dr Guido Sander nurse and fax back a reply.

## 2019-10-31 NOTE — Telephone Encounter (Signed)
Left message on patients voicemail to call office.   

## 2019-11-01 ENCOUNTER — Ambulatory Visit (HOSPITAL_COMMUNITY)
Admission: RE | Admit: 2019-11-01 | Discharge: 2019-11-01 | Disposition: A | Discharge status: Home / Self Care | Payer: Medicare PPO | Source: Ambulatory Visit | Attending: Internal Medicine | Admitting: Internal Medicine

## 2019-11-01 ENCOUNTER — Other Ambulatory Visit: Payer: Self-pay

## 2019-11-01 DIAGNOSIS — D509 Iron deficiency anemia, unspecified: Secondary | ICD-10-CM | POA: Diagnosis not present

## 2019-11-01 MED ORDER — SODIUM CHLORIDE 0.9 % IV SOLN
510.0000 mg | Freq: Once | INTRAVENOUS | Status: AC
  Administered 2019-11-01: 510 mg via INTRAVENOUS
  Filled 2019-11-01: qty 510

## 2019-11-01 NOTE — Telephone Encounter (Signed)
Left message on patients voicemail to call office.   

## 2019-11-01 NOTE — Discharge Instructions (Signed)

## 2019-11-02 NOTE — Telephone Encounter (Signed)
Pt returning your call

## 2019-11-03 NOTE — Telephone Encounter (Signed)
Spoke with patient she was very confused about the colonoscopy being added on and how to prep. It does look like it was added on due to the labs. She verbalized understanding about holding the Xarelto but had several questions about the colonoscopy. Based on this I scheduled her for a previsit so that she can ask questions.

## 2019-11-08 ENCOUNTER — Ambulatory Visit (INDEPENDENT_AMBULATORY_CARE_PROVIDER_SITE_OTHER): Payer: Medicare PPO | Admitting: *Deleted

## 2019-11-08 ENCOUNTER — Ambulatory Visit (AMBULATORY_SURGERY_CENTER): Payer: Self-pay | Admitting: *Deleted

## 2019-11-08 ENCOUNTER — Ambulatory Visit: Payer: Medicare PPO | Admitting: Physical Medicine and Rehabilitation

## 2019-11-08 ENCOUNTER — Encounter: Payer: Self-pay | Admitting: Physical Medicine and Rehabilitation

## 2019-11-08 ENCOUNTER — Other Ambulatory Visit: Payer: Self-pay

## 2019-11-08 VITALS — Temp 97.1°F | Ht 62.0 in | Wt 128.0 lb

## 2019-11-08 VITALS — BP 110/75 | HR 57 | Ht 62.0 in | Wt 127.0 lb

## 2019-11-08 DIAGNOSIS — E538 Deficiency of other specified B group vitamins: Secondary | ICD-10-CM | POA: Diagnosis not present

## 2019-11-08 DIAGNOSIS — G8929 Other chronic pain: Secondary | ICD-10-CM | POA: Diagnosis not present

## 2019-11-08 DIAGNOSIS — M48061 Spinal stenosis, lumbar region without neurogenic claudication: Secondary | ICD-10-CM

## 2019-11-08 DIAGNOSIS — R110 Nausea: Secondary | ICD-10-CM

## 2019-11-08 DIAGNOSIS — M4805 Spinal stenosis, thoracolumbar region: Secondary | ICD-10-CM

## 2019-11-08 DIAGNOSIS — M545 Low back pain, unspecified: Secondary | ICD-10-CM

## 2019-11-08 DIAGNOSIS — M4145 Neuromuscular scoliosis, thoracolumbar region: Secondary | ICD-10-CM | POA: Diagnosis not present

## 2019-11-08 DIAGNOSIS — D649 Anemia, unspecified: Secondary | ICD-10-CM

## 2019-11-08 MED ORDER — SUPREP BOWEL PREP KIT 17.5-3.13-1.6 GM/177ML PO SOLN: 1.0000 | Freq: Once | ORAL | 0 refills | Status: AC

## 2019-11-08 MED ORDER — CYANOCOBALAMIN 1000 MCG/ML IJ SOLN
1000.0000 ug | Freq: Once | INTRAMUSCULAR | Status: AC
  Administered 2019-11-08: 1000 ug via INTRAMUSCULAR

## 2019-11-08 NOTE — Progress Notes (Signed)
Pls cosign for B12 inj../lmb  

## 2019-11-08 NOTE — Progress Notes (Signed)
Pt states pain across the lower back. Pt states last injection 04/04/2019 did not help at all. Pt states standing makes pain worse. Sitting down and resting helps with pain.   .Numeric Pain Rating Scale and Functional Assessment Average Pain 9 Pain Right Now 0 My pain is constant and aching Pain is worse with: bending and standing Pain improves with: rest   In the last MONTH (on 0-10 scale) has pain interfered with the following?  1. General activity like being  able to carry out your everyday physical activities such as walking, climbing stairs, carrying groceries, or moving a chair?  Rating(9)  2. Relation with others like being able to carry out your usual social activities and roles such as  activities at home, at work and in your community. Rating(9)  3. Enjoyment of life such that you have  been bothered by emotional problems such as feeling anxious, depressed or irritable?  Rating(5)

## 2019-11-08 NOTE — Progress Notes (Signed)
No egg or soy allergy known to patient  No issues with past sedation with any surgeries  or procedures, no intubation problems  No diet pills per patient No home 02 use per patient  No blood thinners per patient  Pt denies issues with constipation  No A fib or A flutter  EMMI video sent to pt's e mail   Due to the COVID-19 pandemic we are asking patients to follow these guidelines. Please only bring one care partner. Please be aware that your care partner may wait in the car in the parking lot or if they feel like they will be too hot to wait in the car, they may wait in the lobby on the 4th floor. All care partners are required to wear a mask the entire time (we do not have any that we can provide them), they need to practice social distancing, and we will do a Covid check for all patient's and care partners when you arrive. Also we will check their temperature and your temperature. If the care partner waits in their car they need to stay in the parking lot the entire time and we will call them on their cell phone when the patient is ready for discharge so they can bring the car to the front of the building. Also all patient's will need to wear a mask into building.

## 2019-11-13 ENCOUNTER — Ambulatory Visit: Payer: Medicare PPO | Admitting: Podiatry

## 2019-11-13 ENCOUNTER — Encounter: Payer: Self-pay | Admitting: Podiatry

## 2019-11-13 ENCOUNTER — Other Ambulatory Visit: Payer: Self-pay

## 2019-11-13 DIAGNOSIS — L84 Corns and callosities: Secondary | ICD-10-CM

## 2019-11-13 DIAGNOSIS — B351 Tinea unguium: Secondary | ICD-10-CM

## 2019-11-13 DIAGNOSIS — M79676 Pain in unspecified toe(s): Secondary | ICD-10-CM

## 2019-11-13 DIAGNOSIS — M79671 Pain in right foot: Secondary | ICD-10-CM | POA: Diagnosis not present

## 2019-11-13 DIAGNOSIS — Z9229 Personal history of other drug therapy: Secondary | ICD-10-CM

## 2019-11-13 DIAGNOSIS — M79674 Pain in right toe(s): Secondary | ICD-10-CM

## 2019-11-13 NOTE — Patient Instructions (Signed)
Corns and Calluses Corns are small areas of thickened skin that occur on the top, sides, or tip of a toe. They contain a cone-shaped core with a point that can press on a nerve below. This causes pain.  Calluses are areas of thickened skin that can occur anywhere on the body, including the hands, fingers, palms, soles of the feet, and heels. Calluses are usually larger than corns. What are the causes? Corns and calluses are caused by rubbing (friction) or pressure, such as from shoes that are too tight or do not fit properly. What increases the risk? Corns are more likely to develop in people who have misshapen toes (toe deformities), such as hammer toes. Calluses can occur with friction to any area of the skin. They are more likely to develop in people who:  Work with their hands.  Wear shoes that fit poorly, are too tight, or are high-heeled.  Have toe deformities. What are the signs or symptoms? Symptoms of a corn or callus include:  A hard growth on the skin.  Pain or tenderness under the skin.  Redness and swelling.  Increased discomfort while wearing tight-fitting shoes, if your feet are affected. If a corn or callus becomes infected, symptoms may include:  Redness and swelling that gets worse.  Pain.  Fluid, blood, or pus draining from the corn or callus. How is this diagnosed? Corns and calluses may be diagnosed based on your symptoms, your medical history, and a physical exam. How is this treated? Treatment for corns and calluses may include:  Removing the cause of the friction or pressure. This may involve: ? Changing your shoes. ? Wearing shoe inserts (orthotics) or other protective layers in your shoes, such as a corn pad. ? Wearing gloves.  Applying medicine to the skin (topical medicine) to help soften skin in the hardened, thickened areas.  Removing layers of dead skin with a file to reduce the size of the corn or callus.  Removing the corn or callus with a  scalpel or laser.  Taking antibiotic medicines, if your corn or callus is infected.  Having surgery, if a toe deformity is the cause. Follow these instructions at home:   Take over-the-counter and prescription medicines only as told by your health care provider.  If you were prescribed an antibiotic, take it as told by your health care provider. Do not stop taking it even if your condition starts to improve.  Wear shoes that fit well. Avoid wearing high-heeled shoes and shoes that are too tight or too loose.  Wear any padding, protective layers, gloves, or orthotics as told by your health care provider.  Soak your hands or feet and then use a file or pumice stone to soften your corn or callus. Do this as told by your health care provider.  Check your corn or callus every day for symptoms of infection. Contact a health care provider if you:  Notice that your symptoms do not improve with treatment.  Have redness or swelling that gets worse.  Notice that your corn or callus becomes painful.  Have fluid, blood, or pus coming from your corn or callus.  Have new symptoms. Summary  Corns are small areas of thickened skin that occur on the top, sides, or tip of a toe.  Calluses are areas of thickened skin that can occur anywhere on the body, including the hands, fingers, palms, and soles of the feet. Calluses are usually larger than corns.  Corns and calluses are caused by  rubbing (friction) or pressure, such as from shoes that are too tight or do not fit properly.  Treatment may include wearing any padding, protective layers, gloves, or orthotics as told by your health care provider. This information is not intended to replace advice given to you by your health care provider. Make sure you discuss any questions you have with your health care provider. Document Revised: 11/23/2018 Document Reviewed: 06/16/2017 Elsevier Patient Education  [place]  WHAT IS IT? An infection that lies within the keratin of your nail plate that is caused by a fungus.  WHY ME? Fungal infections affect all ages, sexes, races, and creeds.  There may be many factors that predispose you to a fungal infection such as age, coexisting medical conditions such as diabetes, or an autoimmune disease; stress, medications, fatigue, genetics, etc.  Bottom line: fungus thrives in a warm, moist environment and your shoes offer such a location.  IS IT CONTAGIOUS? Theoretically, yes.  You do not want to share shoes, nail clippers or files with someone who has fungal toenails.  Walking around barefoot in the same room or sleeping in the same bed is unlikely to transfer the organism.  It is important to realize, however, that fungus can spread easily from one nail to the next on the same foot.  HOW DO WE TREAT THIS?  There are several ways to treat this condition.  Treatment may depend on many factors such as age, medications, pregnancy, liver and kidney conditions, etc.  It is best to ask your doctor which options are available to you.  4. No treatment.   Unlike many other medical concerns, you can live with this condition.  However for many people this can be a painful condition and may lead to ingrown toenails or a bacterial infection.  It is recommended that you keep the nails cut short to help reduce the amount of fungal nail. 5. Topical treatment.  These range from herbal remedies to prescription strength nail lacquers.  About 40-50% effective, topicals require twice daily application for approximately 9 to 12 months or until an entirely new nail has grown out.  The most effective topicals are medical grade medications available through physicians offices. 6. Oral antifungal medications.  With an 80-90% cure rate, the most common oral medication requires 3 to 4 months of therapy and stays in your system for a year as the new nail grows out.  Oral antifungal medications do  require blood work to make sure it is a safe drug for you.  A liver function panel will be performed prior to starting the medication and after the first month of treatment.  It is important to have the blood work performed to avoid any harmful side effects.  In general, this medication safe but blood work is required. 7. Laser Therapy.  This treatment is performed by applying a specialized laser to the affected nail plate.  This therapy is noninvasive, fast, and non-painful.  It is not covered by insurance and is therefore, out of pocket.  The results have been very good with a 80-95% cure rate.  The [facility] is the only practice in the area to offer this therapy. 8. Permanent Nail Avulsion.  Removing the entire nail so that a new nail will not grow back.

## 2019-11-15 NOTE — Progress Notes (Signed)
Subjective: Alyssa Luna presents today for follow up of corn(s) right 5th digit and painful mycotic toenails b/l that are difficult to trim. Pain interferes with ambulation. Aggravating factors include wearing enclosed shoe gear. Pain is relieved with periodic professional debridement.   She relates pain in right arch area for the past few weeks. Denies any h/o trauma. Pain described as achy in nature and comes and goes.   Allergies  Allergen Reactions  . Alpha-Gal Itching  . Beef (Bovine) Protein Other (See Comments)    Alpha-gal allergy testing positive June 2018  . Galactose Nausea Only    Per patient, cannot have alpha-gal since tic bite episode.  . Pravastatin Anaphylaxis and Other (See Comments)    Legs ache  . Amlodipine     Leg swelling  . Colchicine Other (See Comments)  . Doxycycline Other (See Comments)    Nausea - able to take w/food  . Shellfish Allergy Other (See Comments)    Unknown  . Tikosyn [Dofetilide] Other (See Comments)    Unknown reaction and was told never to take it due to problems during sleep   . Actonel [Risedronate Sodium] Other (See Comments)    Not known  . Alendronate Other (See Comments)    NOT KNOWN  . Augmentin [Amoxicillin-Pot Clavulanate] Rash    Has patient had a PCN reaction causing immediate rash, facial/tongue/throat swelling, SOB or lightheadedness with hypotension: Yes Has patient had a PCN reaction causing severe rash involving mucus membranes or skin necrosis:No Has patient had a PCN reaction that required hospitalization: No Has patient had a PCN reaction occurring within the last 10 years: No If all of the above answers are "NO", then may proceed with Cephalosporin use.   . Ciprofloxacin Nausea Only  . Evista [Raloxifene] Other (See Comments)    Unknown reaction  . Flagyl [Metronidazole] Other (See Comments)    Not known  . Fosamax [Alendronate Sodium] Other (See Comments)    NOT KNOWN  . Gold-Containing Drug Products Other  (See Comments)    Per Dr  . Loma Sousa [Escitalopram Oxalate] Other (See Comments)    shaky  . Risedronate Other (See Comments)    Unknown reaction  . Simvastatin Other (See Comments)    REACTION: leg cramps, weakness  . Tramadol Other (See Comments)    Mental disturbance changes     Objective: There were no vitals filed for this visit.  Pt 86 y.o. year old Caucasian female WD, WN in NAD. AAO x 3.   Vascular Examination:  Capillary refill time to digits immediate b/l. Palpable DP pulses b/l. Palpable PT pulses b/l. Pedal hair sparse b/l. Skin temperature gradient within normal limits b/l.  Dermatological Examination: Pedal skin with normal turgor, texture and tone bilaterally. No open wounds bilaterally. No interdigital macerations bilaterally. Toenails 1-5 b/l elongated, dystrophic, thickened, crumbly with subungual debris and tenderness to dorsal palpation. Hyperkeratotic lesion(s) R 5th toe.  No erythema, no edema, no drainage, no flocculence.  Musculoskeletal: Normal muscle strength 5/5 to all lower extremity muscle groups bilaterally, no pain crepitus or joint limitation noted with ROM b/l and hammertoes noted to the  R 5th toe. Wearing Skechers slip-on loafers.  Neurological: Protective sensation intact 5/5 intact bilaterally with 10g monofilament b/l Vibratory sensation intact b/l.  Assessment: 1. Pain due to onychomycosis of nail   2. Corns   3. Pain of toe of right foot   4. Hx of long term use of blood thinners    Plan: -Toenails 1-5 b/l  were debrided in length and girth with sterile nail nippers and dremel without iatrogenic bleeding.  -For arch pain, felt arch pad added to right shoe. Will reassess at next visit. -Corn(s) debrided R 5th toe without complication or incident. Total number debrided=1. -Patient to continue soft, supportive shoe gear daily. -Patient to report any pedal injuries to medical professional immediately. -Patient/POA to call should there be  question/concern in the interim.  Return in about 9 weeks (around 01/15/2020) for nail and callus trim/ Plavix.

## 2019-11-21 ENCOUNTER — Other Ambulatory Visit: Payer: Self-pay

## 2019-11-21 ENCOUNTER — Ambulatory Visit (AMBULATORY_SURGERY_CENTER): Payer: Medicare PPO | Admitting: Internal Medicine

## 2019-11-21 ENCOUNTER — Encounter: Payer: Self-pay | Admitting: Internal Medicine

## 2019-11-21 VITALS — BP 113/68 | HR 52 | Temp 96.9°F | Resp 18 | Ht 62.0 in | Wt 129.0 lb

## 2019-11-21 DIAGNOSIS — D649 Anemia, unspecified: Secondary | ICD-10-CM | POA: Diagnosis not present

## 2019-11-21 DIAGNOSIS — K449 Diaphragmatic hernia without obstruction or gangrene: Secondary | ICD-10-CM

## 2019-11-21 DIAGNOSIS — K222 Esophageal obstruction: Secondary | ICD-10-CM | POA: Diagnosis not present

## 2019-11-21 DIAGNOSIS — D509 Iron deficiency anemia, unspecified: Secondary | ICD-10-CM | POA: Diagnosis not present

## 2019-11-21 DIAGNOSIS — R110 Nausea: Secondary | ICD-10-CM

## 2019-11-21 DIAGNOSIS — K5730 Diverticulosis of large intestine without perforation or abscess without bleeding: Secondary | ICD-10-CM | POA: Diagnosis not present

## 2019-11-21 DIAGNOSIS — K648 Other hemorrhoids: Secondary | ICD-10-CM

## 2019-11-21 MED ORDER — SODIUM CHLORIDE 0.9 % IV SOLN: 500.0000 mL | Freq: Once | INTRAVENOUS | Status: DC

## 2019-11-21 NOTE — Progress Notes (Signed)
Pt's states no medical or surgical changes since previsit or office visit.  Temp LC, VS CW

## 2019-11-21 NOTE — Patient Instructions (Signed)
HANDOUTS GIVEN;  Hiatal hernia, diverticulosis, hemorrhoids Resume previous diet Continue present medications Restart Xarelto tonight   YOU HAD AN ENDOSCOPIC PROCEDURE TODAY AT [facility]:   Refer to the procedure report that was given to you for any specific questions about what was found during the examination.  If the procedure report does not answer your questions, please call your gastroenterologist to clarify.  If you requested that your care partner not be given the details of your procedure findings, then the procedure report has been included in a sealed envelope for you to review at your convenience later.  YOU SHOULD EXPECT: Some feelings of bloating in the abdomen. Passage of more gas than usual.  Walking can help get rid of the air that was put into your GI tract during the procedure and reduce the bloating. If you had a lower endoscopy (such as a colonoscopy or flexible sigmoidoscopy) you may notice spotting of blood in your stool or on the toilet paper. If you underwent a bowel prep for your procedure, you may not have a normal bowel movement for a few days.  Please Note:  You might notice some irritation and congestion in your nose or some drainage.  This is from the oxygen used during your procedure.  There is no need for concern and it should clear up in a day or so.  SYMPTOMS TO REPORT IMMEDIATELY:   Following lower endoscopy (colonoscopy or flexible sigmoidoscopy):  Excessive amounts of blood in the stool  Significant tenderness or worsening of abdominal pains  Swelling of the abdomen that is new, acute  Fever of 100F or higher   Following upper endoscopy (EGD)  Vomiting of blood or coffee ground material  New chest pain or pain under the shoulder blades  Painful or persistently difficult swallowing  New shortness of breath  Fever of 100F or higher  Black, tarry-looking stools  For urgent or emergent issues, a gastroenterologist can be reached at  any hour by calling [phone]. Do not use MyChart messaging for urgent concerns.    DIET:  We do recommend a small meal at first, but then you may proceed to your regular diet.  Drink plenty of fluids but you should avoid alcoholic beverages for 24 hours.  ACTIVITY:  You should plan to take it easy for the rest of today and you should NOT DRIVE or use heavy machinery until tomorrow (because of the sedation medicines used during the test).    FOLLOW UP: Our staff will call the number listed on your records 48-72 hours following your procedure to check on you and address any questions or concerns that you may have regarding the information given to you following your procedure. If we do not reach you, we will leave a message.  We will attempt to reach you two times.  During this call, we will ask if you have developed any symptoms of COVID 19. If you develop any symptoms (ie: fever, flu-like symptoms, shortness of breath, cough etc.) before then, please call [phone].  If you test positive for Covid 19 in the 2 weeks post procedure, please call and report this information to [place].    If any biopsies were taken you will be contacted by phone or by letter within the next 1-3 weeks.  Please call us at [phone] if you have not heard about the biopsies in 3 weeks.    SIGNATURES/CONFIDENTIALITY: You and/or your care partner have signed paperwork which will be entered  into your electronic medical record.  These signatures attest to the fact that that the information above on your After Visit Summary has been reviewed and is understood.  Full responsibility of the confidentiality of this discharge information lies with you and/or your care-partner.

## 2019-11-21 NOTE — Op Note (Signed)
[facility] Patient Name: Alyssa Luna Procedure Date: 11/21/2019 10:44 AM MRN: [id number] Endoscopist: Jerene Bears , MD Age: 86 Referring MD:  Date of Birth: 1934-06-22 Gender: Female Account #: [id number] Procedure:                Colonoscopy Indications:              Iron deficiency, remote colonic polyps (2013) Medicines:                Monitored Anesthesia Care Procedure:                Pre-Anesthesia Assessment:                           - Prior to the procedure, a History and Physical                            was performed, and patient medications and                            allergies were reviewed. The patient's tolerance of                            previous anesthesia was also reviewed. The risks                            and benefits of the procedure and the sedation                            options and risks were discussed with the patient.                            All questions were answered, and informed consent                            was obtained. Prior Anticoagulants: The patient has                            taken Xarelto (rivaroxaban), last dose was 2 days                            prior to procedure. ASA Grade Assessment: III - A                            patient with severe systemic disease. After                            reviewing the risks and benefits, the patient was                            deemed in satisfactory condition to undergo the                            procedure.  After obtaining informed consent, the colonoscope                            was passed under direct vision. Throughout the                            procedure, the patient's blood pressure, pulse, and                            oxygen saturations were monitored continuously. The                            Colonoscope was introduced through the anus and                            advanced to the cecum, identified by appendiceal                             orifice and ileocecal valve. The colonoscopy was                            performed without difficulty. The patient tolerated                            the procedure well. The quality of the bowel                            preparation was excellent. The ileocecal valve,                            appendiceal orifice, and rectum were photographed. Scope In: 11:04:56 AM Scope Out: 00:93:81 AM Scope Withdrawal Time: 0 hours 4 minutes 53 seconds  Total Procedure Duration: 0 hours 14 minutes 16 seconds  Findings:                 The digital rectal exam was normal.                           Multiple small and large-mouthed diverticula were                            found in the sigmoid colon, transverse colon,                            hepatic flexure and ascending colon.                           Internal hemorrhoids were found. The hemorrhoids                            were medium-sized.                           The exam was otherwise without abnormality. Complications:            No immediate  complications. Estimated Blood Loss:     Estimated blood loss: none. Impression:               - Diverticulosis in the sigmoid colon, in the                            transverse colon, at the hepatic flexure and in the                            ascending colon.                           - Internal hemorrhoids.                           - The examination was otherwise normal.                           - No specimens collected. Recommendation:           - Patient has a contact number available for                            emergencies. The signs and symptoms of potential                            delayed complications were discussed with the                            patient. Return to normal activities tomorrow.                            Written discharge instructions were provided to the                            patient.                           - Resume  previous diet.                           - Continue present medications.                           - Would continue to replace iron IV as needed.                           - No recommendation at this time regarding repeat                            colonoscopy due to age and no polyps on today's                            exam. Jerene Bears, MD 11/21/2019 11:27:38 AM This report has been signed electronically.

## 2019-11-21 NOTE — Op Note (Signed)
[facility] Patient Name: Alyssa Luna Procedure Date: 11/21/2019 10:45 AM MRN: [id number] Endoscopist: Jerene Bears , MD Age: 86 Referring MD:  Date of Birth: 1934/07/14 Gender: Female Account #: [id number] Procedure:                Upper GI endoscopy Indications:              Iron deficiency, Nausea Medicines:                Monitored Anesthesia Care Procedure:                Pre-Anesthesia Assessment:                           - Prior to the procedure, a History and Physical                            was performed, and patient medications and                            allergies were reviewed. The patient's tolerance of                            previous anesthesia was also reviewed. The risks                            and benefits of the procedure and the sedation                            options and risks were discussed with the patient.                            All questions were answered, and informed consent                            was obtained. Prior Anticoagulants: The patient has                            taken Xarelto (rivaroxaban), last dose was 2 days                            prior to procedure. ASA Grade Assessment: III - A                            patient with severe systemic disease. After                            reviewing the risks and benefits, the patient was                            deemed in satisfactory condition to undergo the                            procedure.  After obtaining informed consent, the endoscope was                            passed under direct vision. Throughout the                            procedure, the patient's blood pressure, pulse, and                            oxygen saturations were monitored continuously. The                            Endoscope was introduced through the mouth, and                            advanced to the second part of duodenum. The upper               GI endoscopy was accomplished without difficulty.                            The patient tolerated the procedure well. Scope In: Scope Out: Findings:                 Normal mucosa was found in the entire esophagus.                           A non-obstructing Schatzki ring was found at the                            gastroesophageal junction.                           A 6 cm hiatal hernia was found. The proximal extent                            of the gastric folds (end of tubular esophagus) was                            33 cm from the incisors. The hiatal narrowing was                            39 cm from the incisors.                           The entire examined stomach was normal.                           The examined duodenum was normal. Complications:            No immediate complications. Estimated Blood Loss:     Estimated blood loss: none. Impression:               - Normal mucosa was found in the entire esophagus.                           -  Non-obstructing Schatzki ring.                           - 6 cm hiatal hernia.                           - Normal stomach.                           - Normal examined duodenum.                           - No specimens collected. Recommendation:           - Patient has a contact number available for                            emergencies. The signs and symptoms of potential                            delayed complications were discussed with the                            patient. Return to normal activities tomorrow.                            Written discharge instructions were provided to the                            patient.                           - Resume previous diet.                           - Continue present medications.                           - See the other procedure note for documentation of                            additional recommendations. Jerene Bears, MD 11/21/2019 11:24:50 AM This report has  been signed electronically.

## 2019-11-21 NOTE — Progress Notes (Signed)
A/ox3, pleased with MAC, report to RN 

## 2019-11-23 ENCOUNTER — Telehealth: Payer: Self-pay | Admitting: *Deleted

## 2019-11-23 NOTE — Telephone Encounter (Signed)
  Follow up Call-  Call back number 11/21/2019  Post procedure Call Back phone  # [phone]  Permission to leave phone message Yes  Some recent data might be hidden     Patient questions:  Do you have a fever, pain , or abdominal swelling? No. Pain Score  0 *  Have you tolerated food without any problems? Yes.    Have you been able to return to your normal activities? Yes.    Do you have any questions about your discharge instructions: Diet   No. Medications  No. Follow up visit  No.  Do you have questions or concerns about your Care? No.  Actions: * If pain score is 4 or above: No action needed, pain <4.  1. Have you developed a fever since your procedure? no  2.   Have you had an respiratory symptoms (SOB or cough) since your procedure? no  3.   Have you tested positive for COVID 19 since your procedure no  4.   Have you had any family members/close contacts diagnosed with the COVID 19 since your procedure?  no   If yes to any of these questions please route to Joylene John, RN and Erenest Rasher, RN

## 2019-11-28 ENCOUNTER — Other Ambulatory Visit: Payer: Self-pay | Admitting: Internal Medicine

## 2019-11-30 ENCOUNTER — Telehealth: Payer: Self-pay | Admitting: Surgery

## 2019-11-30 ENCOUNTER — Other Ambulatory Visit: Payer: Self-pay | Admitting: Radiology

## 2019-11-30 DIAGNOSIS — M5410 Radiculopathy, site unspecified: Secondary | ICD-10-CM

## 2019-11-30 DIAGNOSIS — M48062 Spinal stenosis, lumbar region with neurogenic claudication: Secondary | ICD-10-CM

## 2019-11-30 NOTE — Telephone Encounter (Signed)
Today had a phone conversation with his 86 year old patient regarding ongoing low back pain and right greater than left lower extremity radiculopathy.  Patient has known history of: Visit Diagnoses:  1. Spinal stenosis of lumbar region with neurogenic claudication   2. Age-related osteoporosis with current pathological fracture, initial encounter   3. Other spondylosis with radiculopathy, lumbar region   4. Idiopathic scoliosis of thoracolumbar region   5. Degenerative disc disease, lumbar   6. Other secondary scoliosis, lumbar region    Was last seen by Dr. Louanne Skye for this October 04, 2019.  She is also seen Dr. Ernestina Patches in the past for injections.  Last lumbar MRI June 2020 and that showed:   EXAM: MRI LUMBAR SPINE WITHOUT CONTRAST  TECHNIQUE: Multiplanar, multisequence MR imaging of the lumbar spine was performed. No intravenous contrast was administered.  COMPARISON:  CT abdomen and pelvis 01/29/2019.  FINDINGS: Segmentation:  Standard.  Alignment: Marked convex right scoliosis with the apex at L2-3 is present.  Vertebrae: The patient has a superior endplate compression fracture of L2 with vertebral body height loss of up to approximately 30%. There is marrow edema in the vertebral body consistent with acute or early subacute injury. Superior endplate compression fractures of T10 and T11 are also identified. There is minimal marrow edema in the superior endplate of L89 consistent with late subacute to remote injury. The T11 fracture is old. The patient is status post vertebral augmentation at L1.  Conus medullaris and cauda equina: Conus extends to the L2 level. Conus and cauda equina appear normal.  Paraspinal and other soft tissues: Negative.  Disc levels:  T9-10 and T10-11 are imaged in the sagittal plane only. The central canal and foramina appear open at both levels.  T11-12: Shallow disc bulge without stenosis.  T12-L1: A left paracentral protrusion  is present with some cephalad extension. There is some bony retropulsion off the superior endplate of L1. The central canal the patient's protrusion indents the left side of the thecal sac mildly deflecting the cord in conjunction with bony retropulsion. The foramina are open.  L1-2: Shallow left paracentral protrusion without stenosis. No notable retropulsion off the superior endplate of L2.  L3-4: Shallow disc bulge to the left causes mild narrowing in the left subarticular recess and foramen. The right foramen is open.  L3-4: There is a shallow disc bulge and some facet degenerative disease. There is mild narrowing in the left subarticular recess. The central canal and right foramen are open.  L4-5: Small right foraminal protrusion and endplate spur are superimposed on a shallow bulge. There is moderate right foraminal narrowing. The central canal and left foramen are open.  L5-S1: Right worse than left facet arthropathy and a shallow disc bulge. No stenosis.  IMPRESSION: Acute or early subacute superior endplate compression fracture of L2 with vertebral body height loss of approximately 30%. There is minimal retropulsion off the superior endplate of L2. No involvement of the posterior elements. The appearance of the fractures consistent with a senile osteoporotic injury.  Late subacute to remote superior endplate compression fracture of T10.  Remote compression fractures of T11 and L1.  Severe convex right scoliosis.  Left paracentral protrusion at T12-L1 mildly deflects the cord at T12-L1 in conjunction with retropulsion off the superior endplate of L1.  Mild narrowing in the left subarticular recess at L3-4 due to a shallow bulge to the left.  Mild left subarticular recess narrowing L3-4 due to a disc bulge.  Moderate right foraminal narrowing L4-5.  With this patient's ongoing symptoms I do feel conservative treatment I recommend repeating lumbar  MRI and compared to previous study.  I would like to try to get this done as soon as possible so that way Dr. Louanne Skye can have the report and and images available to him next Monday for her appointment that is already scheduled.  Patient comfortable with this plan.  Until her appointment she may use Tylenol.  She is unable to use oral NSAIDs due to her being on Xarelto.  Advised patient that I also do not want to use narcotic medication.[name]

## 2019-11-30 NOTE — Telephone Encounter (Signed)
Patient called advised she would rather have her MRI in [place] during the daylight hours. Patient said she have an appointment on Monday with Dr Louanne Skye. The number to contact patient is [phone]

## 2019-12-01 ENCOUNTER — Encounter: Payer: Self-pay | Admitting: Specialist

## 2019-12-01 DIAGNOSIS — M5115 Intervertebral disc disorders with radiculopathy, thoracolumbar region: Secondary | ICD-10-CM | POA: Diagnosis not present

## 2019-12-01 DIAGNOSIS — M4726 Other spondylosis with radiculopathy, lumbar region: Secondary | ICD-10-CM | POA: Diagnosis not present

## 2019-12-01 DIAGNOSIS — M5116 Intervertebral disc disorders with radiculopathy, lumbar region: Secondary | ICD-10-CM | POA: Diagnosis not present

## 2019-12-01 DIAGNOSIS — M5410 Radiculopathy, site unspecified: Secondary | ICD-10-CM | POA: Diagnosis not present

## 2019-12-01 DIAGNOSIS — M4186 Other forms of scoliosis, lumbar region: Secondary | ICD-10-CM | POA: Diagnosis not present

## 2019-12-01 NOTE — Telephone Encounter (Signed)
FYI --- This was the STAT MRI Jeneen Rinks sent you yesterday.

## 2019-12-01 NOTE — Telephone Encounter (Signed)
Order has been sent to [facility] imaging they will contact pt to schedule appt, I did inform imaging of the appt scheduled, they will try to get her in asap

## 2019-12-02 DIAGNOSIS — K449 Diaphragmatic hernia without obstruction or gangrene: Secondary | ICD-10-CM | POA: Diagnosis not present

## 2019-12-02 DIAGNOSIS — S32028D Other fracture of second lumbar vertebra, subsequent encounter for fracture with routine healing: Secondary | ICD-10-CM | POA: Diagnosis not present

## 2019-12-02 DIAGNOSIS — M47817 Spondylosis without myelopathy or radiculopathy, lumbosacral region: Secondary | ICD-10-CM | POA: Diagnosis not present

## 2019-12-02 DIAGNOSIS — R109 Unspecified abdominal pain: Secondary | ICD-10-CM | POA: Diagnosis not present

## 2019-12-02 DIAGNOSIS — M5415 Radiculopathy, thoracolumbar region: Secondary | ICD-10-CM | POA: Diagnosis not present

## 2019-12-02 DIAGNOSIS — S32018D Other fracture of first lumbar vertebra, subsequent encounter for fracture with routine healing: Secondary | ICD-10-CM | POA: Diagnosis not present

## 2019-12-02 DIAGNOSIS — G8929 Other chronic pain: Secondary | ICD-10-CM | POA: Diagnosis not present

## 2019-12-02 DIAGNOSIS — M8589 Other specified disorders of bone density and structure, multiple sites: Secondary | ICD-10-CM | POA: Diagnosis not present

## 2019-12-02 DIAGNOSIS — R5383 Other fatigue: Secondary | ICD-10-CM | POA: Diagnosis not present

## 2019-12-02 DIAGNOSIS — K8020 Calculus of gallbladder without cholecystitis without obstruction: Secondary | ICD-10-CM | POA: Diagnosis not present

## 2019-12-02 DIAGNOSIS — M1611 Unilateral primary osteoarthritis, right hip: Secondary | ICD-10-CM | POA: Diagnosis not present

## 2019-12-02 DIAGNOSIS — M5441 Lumbago with sciatica, right side: Secondary | ICD-10-CM | POA: Diagnosis not present

## 2019-12-02 MED ORDER — LIDOCAINE 4 % EX PTCH
1.00 | MEDICATED_PATCH | CUTANEOUS | Status: DC
Start: 2019-12-03 — End: 2019-12-02

## 2019-12-02 MED ORDER — METHOCARBAMOL 500 MG PO TABS
500.00 | ORAL_TABLET | ORAL | Status: DC
Start: 2019-12-02 — End: 2019-12-02

## 2019-12-04 ENCOUNTER — Ambulatory Visit: Payer: Medicare PPO | Admitting: Specialist

## 2019-12-04 ENCOUNTER — Other Ambulatory Visit: Payer: Self-pay

## 2019-12-04 ENCOUNTER — Encounter: Payer: Self-pay | Admitting: Specialist

## 2019-12-04 VITALS — BP 107/64 | HR 70 | Ht 62.0 in | Wt 126.0 lb

## 2019-12-04 DIAGNOSIS — M5136 Other intervertebral disc degeneration, lumbar region: Secondary | ICD-10-CM | POA: Diagnosis not present

## 2019-12-04 DIAGNOSIS — M8000XA Age-related osteoporosis with current pathological fracture, unspecified site, initial encounter for fracture: Secondary | ICD-10-CM

## 2019-12-04 DIAGNOSIS — M4156 Other secondary scoliosis, lumbar region: Secondary | ICD-10-CM

## 2019-12-04 DIAGNOSIS — M48061 Spinal stenosis, lumbar region without neurogenic claudication: Secondary | ICD-10-CM | POA: Diagnosis not present

## 2019-12-04 DIAGNOSIS — M48062 Spinal stenosis, lumbar region with neurogenic claudication: Secondary | ICD-10-CM | POA: Diagnosis not present

## 2019-12-04 DIAGNOSIS — M5410 Radiculopathy, site unspecified: Secondary | ICD-10-CM | POA: Diagnosis not present

## 2019-12-04 DIAGNOSIS — Z7901 Long term (current) use of anticoagulants: Secondary | ICD-10-CM

## 2019-12-04 DIAGNOSIS — M2170 Unequal limb length (acquired), unspecified site: Secondary | ICD-10-CM | POA: Diagnosis not present

## 2019-12-04 DIAGNOSIS — M4726 Other spondylosis with radiculopathy, lumbar region: Secondary | ICD-10-CM
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
## 2019-12-04 NOTE — Patient Instructions (Addendum)
Plan: Ice or Heat as needed for pan, Hemp CBD capsules, amazon.com 5,000-7,000 mg per bottle, 60 capsules per bottle, take one capsule twice a day. Cane in the left hand to use with left leg weight bearing. Will order Physical Therapy through your assisted living facility, [facility].  Work on core strengthening and hamstring stretching, LE strengthening and graduated aerobic exercises, use of a cane.  Follow-Up Instructions: No follow-ups on file.

## 2019-12-06 ENCOUNTER — Telehealth: Payer: Self-pay | Admitting: Specialist

## 2019-12-06 NOTE — Telephone Encounter (Signed)
I called patient to schedule PT. She says that she lives in an assistant living place and they provide therapy there. Her call back number is [phone]

## 2019-12-07 ENCOUNTER — Ambulatory Visit (INDEPENDENT_AMBULATORY_CARE_PROVIDER_SITE_OTHER): Payer: Medicare PPO | Admitting: *Deleted

## 2019-12-07 ENCOUNTER — Other Ambulatory Visit: Payer: Self-pay

## 2019-12-07 DIAGNOSIS — E538 Deficiency of other specified B group vitamins: Secondary | ICD-10-CM | POA: Diagnosis not present

## 2019-12-07 MED ORDER — CYANOCOBALAMIN 1000 MCG/ML IJ SOLN
1000.0000 ug | Freq: Once | INTRAMUSCULAR | Status: AC
  Administered 2019-12-07: 1000 ug via INTRAMUSCULAR

## 2019-12-07 NOTE — Progress Notes (Signed)
Pls cosign for B12 inj../lmb  

## 2019-12-08 NOTE — Telephone Encounter (Signed)
noted 

## 2019-12-14 ENCOUNTER — Other Ambulatory Visit: Payer: Self-pay

## 2019-12-14 ENCOUNTER — Telehealth: Payer: Self-pay | Admitting: Internal Medicine

## 2019-12-14 ENCOUNTER — Encounter: Payer: Self-pay | Admitting: Internal Medicine

## 2019-12-14 ENCOUNTER — Ambulatory Visit: Payer: Medicare PPO | Admitting: Internal Medicine

## 2019-12-14 DIAGNOSIS — E538 Deficiency of other specified B group vitamins: Secondary | ICD-10-CM

## 2019-12-14 DIAGNOSIS — F413 Other mixed anxiety disorders: Secondary | ICD-10-CM

## 2019-12-14 DIAGNOSIS — K146 Glossodynia: Secondary | ICD-10-CM

## 2019-12-14 DIAGNOSIS — J0101 Acute recurrent maxillary sinusitis: Secondary | ICD-10-CM | POA: Diagnosis not present

## 2019-12-14 DIAGNOSIS — R5382 Chronic fatigue, unspecified: Secondary | ICD-10-CM

## 2019-12-14 DIAGNOSIS — D649 Anemia, unspecified: Secondary | ICD-10-CM

## 2019-12-14 DIAGNOSIS — J019 Acute sinusitis, unspecified: Secondary | ICD-10-CM | POA: Insufficient documentation

## 2019-12-14 DIAGNOSIS — E559 Vitamin D deficiency, unspecified: Secondary | ICD-10-CM | POA: Diagnosis not present

## 2019-12-14 LAB — CBC WITH DIFFERENTIAL/PLATELET
Basophils Absolute: 0.1 10*3/uL (ref 0.0–0.1)
Basophils Relative: 1.2 % (ref 0.0–3.0)
Eosinophils Absolute: 0 10*3/uL (ref 0.0–0.7)
Eosinophils Relative: 0.8 % (ref 0.0–5.0)
HCT: 42.3 % (ref 36.0–46.0)
Hemoglobin: 13.9 g/dL (ref 12.0–15.0)
Lymphocytes Relative: 36.2 % (ref 12.0–46.0)
Lymphs Abs: 1.7 10*3/uL (ref 0.7–4.0)
MCHC: 32.9 g/dL (ref 30.0–36.0)
MCV: 90.1 fl (ref 78.0–100.0)
Monocytes Absolute: 0.5 10*3/uL (ref 0.1–1.0)
Monocytes Relative: 9.8 % (ref 3.0–12.0)
Neutro Abs: 2.4 10*3/uL (ref 1.4–7.7)
Neutrophils Relative %: 52 % (ref 43.0–77.0)
Platelets: 181 10*3/uL (ref 150.0–400.0)
RBC: 4.69 Mil/uL (ref 3.87–5.11)
RDW: 18.7 % — ABNORMAL HIGH (ref 11.5–15.5)
WBC: 4.6 10*3/uL (ref 4.0–10.5)

## 2019-12-14 LAB — HEPATIC FUNCTION PANEL
ALT: 13 U/L (ref 0–35)
AST: 23 U/L (ref 0–37)
Albumin: 4.1 g/dL (ref 3.5–5.2)
Alkaline Phosphatase: 49 U/L (ref 39–117)
Bilirubin, Direct: 0.1 mg/dL (ref 0.0–0.3)
Total Bilirubin: 0.8 mg/dL (ref 0.2–1.2)
Total Protein: 6.7 g/dL (ref 6.0–8.3)

## 2019-12-14 LAB — BASIC METABOLIC PANEL
BUN: 11 mg/dL (ref 6–23)
CO2: 31 mEq/L (ref 19–32)
Calcium: 9.8 mg/dL (ref 8.4–10.5)
Chloride: 105 mEq/L (ref 96–112)
Creatinine, Ser: 0.72 mg/dL (ref 0.40–1.20)
GFR: 77.12 mL/min (ref >60.00)
Glucose, Bld: 91 mg/dL (ref 70–99)
Potassium: 4 mEq/L (ref 3.5–5.1)
Sodium: 142 mEq/L (ref 135–145)

## 2019-12-14 LAB — IRON,TIBC AND FERRITIN PANEL
%SAT: 25 % (calc) (ref 16–45)
Ferritin: 76 ng/mL (ref 16–288)
Iron: 60 ug/dL (ref 45–160)
TIBC: 242 mcg/dL (calc) — ABNORMAL LOW (ref 250–450)

## 2019-12-14 LAB — T4, FREE: Free T4: 1.33 ng/dL (ref 0.60–1.60)

## 2019-12-14 MED ORDER — CEFUROXIME AXETIL 250 MG PO TABS: 250.0000 mg | ORAL_TABLET | Freq: Two times a day (BID) | ORAL | 0 refills | Status: DC

## 2019-12-14 MED ORDER — FLUCONAZOLE 150 MG PO TABS: 150.0000 mg | ORAL_TABLET | Freq: Once | ORAL | 1 refills | Status: AC

## 2019-12-14 NOTE — Assessment & Plan Note (Addendum)
Re-start Vit D 

## 2019-12-14 NOTE — Progress Notes (Signed)
Subjective:  Patient ID: Alyssa Luna, female    DOB: 1933-12-16  Age: 86 y.o. MRN: [id number]  CC: No chief complaint on file.   HPI Alyssa Luna presents for scoliosis w/back pain - Dr Alyssa Luna F/o burning mouth, nasal congestion, hoarseness - worse C/o feeling bad The pt had iron infusion for anemia. GI w/u w/EGD/colon was negative  Outpatient Medications Prior to Visit  Medication Sig Dispense Refill  . acetaminophen (TYLENOL) 325 MG tablet Take by mouth.    [name] alum & mag hydroxide-simeth (MAALOX/MYLANTA) 200-200-20 MG/5ML suspension Take by mouth.    . cetirizine (ZYRTEC) 10 MG tablet Take 10 mg by mouth daily as needed for allergies.    . chlorhexidine (PERIDEX) 0.12 % solution PLEASE SEE ATTACHED FOR DETAILED DIRECTIONS    . Cholecalciferol (D3 DOTS) 2000 units TBDP Take by mouth daily.    . Cyanocobalamin (VITAMIN B-12 IJ) Inject as directed every 30 (thirty) days.    [name] denosumab (PROLIA) 60 MG/ML SOLN injection Inject 60 mg into the skin every 6 (six) months. Administer in upper arm, thigh, or abdomen Unable to tolerate oral meds Dx severe osteoporosis 1.8 mL 6  . diphenhydrAMINE HCl (BENADRYL ALLERGY CHILDRENS PO) Take by mouth at bedtime as needed. Takes at night when itching    . famotidine (PEPCID) 20 MG tablet Take 1 tablet (20 mg total) by mouth 2 (two) times daily. 180 tablet 3  . gabapentin (NEURONTIN) 600 MG tablet TAKE 1/2 TABLET BY MOUTH 4 TIMES A DAY    . levothyroxine (SYNTHROID) 25 MCG tablet Take 1 tablet (25 mcg total) by mouth daily before breakfast. 90 tablet 3  . LORazepam (ATIVAN) 0.5 MG tablet Take 1-2 tablets (0.5-1 mg total) by mouth 2 (two) times daily as needed for anxiety or sleep. Three months prescription 180 tablet 1  . montelukast (SINGULAIR) 10 MG tablet TAKE 1 TABLET BY MOUTH EVERY DAY 90 tablet 1  . Rivaroxaban (XARELTO) 15 MG TABS tablet Take 15 mg by mouth daily.     . Simethicone (GAS-X PO) Take 1 tablet by mouth daily as needed  (flatulence).     [name] tiZANidine (ZANAFLEX) 2 MG tablet Take 1 tablet (2 mg total) by mouth every 6 (six) hours as needed for muscle spasms. 30 tablet 0  . Wheat Dextrin (BENEFIBER PO) Take 1 tablet by mouth daily.    . ferrous sulfate 325 (65 FE) MG tablet TAKE 1 TABLET BY MOUTH EVERY DAY (Patient not taking: Reported on 12/14/2019) 90 tablet 3   No facility-administered medications prior to visit.    ROS: Review of Systems  Constitutional: Positive for fatigue. Negative for activity change, appetite change, chills and unexpected weight change.  HENT: Positive for postnasal drip and sinus pressure. Negative for congestion and mouth sores.   Eyes: Negative for visual disturbance.  Respiratory: Negative for cough and chest tightness.   Gastrointestinal: Negative for abdominal pain and nausea.  Genitourinary: Negative for difficulty urinating, frequency and vaginal pain.  Musculoskeletal: Negative for back pain and gait problem.  Skin: Negative for pallor and rash.  Neurological: Negative for dizziness, tremors, weakness, numbness and headaches.  Psychiatric/Behavioral: Negative for confusion and sleep disturbance.    Objective:  BP 126/72 (BP Location: Left Arm, Patient Position: Sitting, Cuff Size: Normal)   Pulse (!) 51   Temp 97.9 F (36.6 C) (Oral)   Ht 5' 2"  (1.575 m)   Wt 125 lb (56.7 kg)   SpO2 96%   BMI 22.86 kg/m  BP Readings from Last 3 Encounters:  12/14/19 126/72  12/04/19 107/64  11/21/19 113/68    Wt Readings from Last 3 Encounters:  12/14/19 125 lb (56.7 kg)  12/04/19 126 lb (57.2 kg)  11/21/19 129 lb (58.5 kg)    Physical Exam Constitutional:      General: She is not in acute distress.    Appearance: She is well-developed.  HENT:     Head: Normocephalic.     Right Ear: External ear normal.     Left Ear: External ear normal.     Nose: Nose normal.  Eyes:     General:        Right eye: No discharge.        Left eye: No discharge.      Conjunctiva/sclera: Conjunctivae normal.     Pupils: Pupils are equal, round, and reactive to light.  Neck:     Thyroid: No thyromegaly.     Vascular: No JVD.     Trachea: No tracheal deviation.  Cardiovascular:     Rate and Rhythm: Normal rate and regular rhythm.     Heart sounds: Normal heart sounds.  Pulmonary:     Effort: No respiratory distress.     Breath sounds: No stridor. No wheezing.  Abdominal:     General: Bowel sounds are normal. There is no distension.     Palpations: Abdomen is soft. There is no mass.     Tenderness: There is no abdominal tenderness. There is no guarding or rebound.  Musculoskeletal:        General: Tenderness present.     Cervical back: Normal range of motion and neck supple.  Lymphadenopathy:     Cervical: No cervical adenopathy.  Skin:    Findings: No erythema or rash.  Neurological:     Mental Status: She is oriented to person, place, and time.     Cranial Nerves: No cranial nerve deficit.     Motor: No abnormal muscle tone.     Coordination: Coordination abnormal.     Gait: Gait abnormal.     Deep Tendon Reflexes: Reflexes normal.  Psychiatric:        Behavior: Behavior normal.        Thought Content: Thought content normal.        Judgment: Judgment normal.   Alyssa LS w/pain Mouth clear  Nares w/swelling  Lab Results  Component Value Date   WBC 3.0 (L) 10/17/2019   HGB 11.6 (L) 10/17/2019   HCT 35.8 (L) 10/17/2019   PLT 187.0 10/17/2019   GLUCOSE 82 10/17/2019   CHOL 160 02/25/2018   TRIG 119.0 02/25/2018   HDL 42.90 02/25/2018   LDLDIRECT 178.0 09/24/2011   LDLCALC 94 02/25/2018   ALT 44 (H) 04/26/2019   AST 64 (H) 04/26/2019   NA 141 10/17/2019   K 4.0 10/17/2019   CL 105 10/17/2019   CREATININE 0.74 10/17/2019   BUN 9 10/17/2019   CO2 29 10/17/2019   TSH 2.26 10/17/2019   INR 1.3 (H) 02/07/2019    No results found.  Assessment & Plan:   There are no diagnoses linked to this encounter.   No orders of the  defined types were placed in this encounter.    Follow-up: No follow-ups on file.  Alyssa Kehr, MD

## 2019-12-14 NOTE — Addendum Note (Signed)
Addended by: Cresenciano Lick on: 12/14/2019 09:50 AM   Modules accepted: Orders

## 2019-12-14 NOTE — Assessment & Plan Note (Signed)
Ceftin Diflucan if needed

## 2019-12-14 NOTE — Assessment & Plan Note (Signed)
On B12 inj

## 2019-12-14 NOTE — Assessment & Plan Note (Signed)
Lorazepam prn  Potential benefits of a long term benzodiazepines  use as well as potential risks  and complications were explained to the patient and were aknowledged.

## 2019-12-14 NOTE — Telephone Encounter (Signed)
    [employer] calling for additional clinical for Prolia Phone [phone] Ref #[id number]

## 2019-12-14 NOTE — Assessment & Plan Note (Signed)
The pt had iron infusion for anemia. GI w/u w/EGD/colon was negative - Dr Hilarie Fredrickson

## 2019-12-14 NOTE — Assessment & Plan Note (Signed)
No better

## 2019-12-14 NOTE — Assessment & Plan Note (Addendum)
CFS - worse. Treat sinusitis. Reduce Gabapentin to qhs

## 2019-12-15 NOTE — Telephone Encounter (Signed)
Faxed received requesting information. Completed and faxed.

## 2019-12-21 DIAGNOSIS — M25561 Pain in right knee: Secondary | ICD-10-CM | POA: Diagnosis not present

## 2019-12-22 ENCOUNTER — Telehealth: Payer: Self-pay | Admitting: Internal Medicine

## 2019-12-22 NOTE — Telephone Encounter (Signed)
New Message:    Pt is calling and would like a call back to discuss her lab results. Please advise.

## 2019-12-26 NOTE — Telephone Encounter (Signed)
Pt notified of lab results.  Pt states she received notification that her Prolia was approved, have you seen this?

## 2019-12-28 DIAGNOSIS — M545 Low back pain: Secondary | ICD-10-CM | POA: Diagnosis not present

## 2019-12-28 DIAGNOSIS — M48061 Spinal stenosis, lumbar region without neurogenic claudication: Secondary | ICD-10-CM | POA: Diagnosis not present

## 2019-12-28 DIAGNOSIS — M6281 Muscle weakness (generalized): Secondary | ICD-10-CM | POA: Diagnosis not present

## 2019-12-28 NOTE — Telephone Encounter (Signed)
Appointment scheduled.

## 2020-01-03 ENCOUNTER — Ambulatory Visit: Payer: Medicare PPO | Admitting: Specialist

## 2020-01-08 ENCOUNTER — Other Ambulatory Visit: Payer: Self-pay

## 2020-01-08 ENCOUNTER — Ambulatory Visit: Payer: Medicare PPO

## 2020-01-08 ENCOUNTER — Ambulatory Visit (INDEPENDENT_AMBULATORY_CARE_PROVIDER_SITE_OTHER): Payer: Medicare PPO

## 2020-01-08 DIAGNOSIS — M810 Age-related osteoporosis without current pathological fracture: Secondary | ICD-10-CM

## 2020-01-08 DIAGNOSIS — E538 Deficiency of other specified B group vitamins: Secondary | ICD-10-CM

## 2020-01-08 MED ORDER — DENOSUMAB 60 MG/ML ~~LOC~~ SOSY
60.0000 mg | PREFILLED_SYRINGE | Freq: Once | SUBCUTANEOUS | Status: AC
  Administered 2020-01-08: 60 mg via SUBCUTANEOUS

## 2020-01-08 MED ORDER — CYANOCOBALAMIN 1000 MCG/ML IJ SOLN
1000.0000 ug | Freq: Once | INTRAMUSCULAR | Status: AC
  Administered 2020-01-08: 1000 ug via INTRAMUSCULAR

## 2020-01-08 NOTE — Progress Notes (Addendum)
Pt was given both the B12 and Prolia shot today.  Please Sign off.  Medical screening examination/treatment/procedure(s) were performed by non-physician practitioner and as supervising physician I was immediately available for consultation/collaboration. I agree with above. Lew Dawes, MD

## 2020-01-11 DIAGNOSIS — M48061 Spinal stenosis, lumbar region without neurogenic claudication: Secondary | ICD-10-CM | POA: Diagnosis not present

## 2020-01-11 DIAGNOSIS — M545 Low back pain: Secondary | ICD-10-CM | POA: Diagnosis not present

## 2020-01-11 DIAGNOSIS — M6281 Muscle weakness (generalized): Secondary | ICD-10-CM | POA: Diagnosis not present

## 2020-01-17 DIAGNOSIS — M48061 Spinal stenosis, lumbar region without neurogenic claudication: Secondary | ICD-10-CM | POA: Diagnosis not present

## 2020-01-17 DIAGNOSIS — M6281 Muscle weakness (generalized): Secondary | ICD-10-CM | POA: Diagnosis not present

## 2020-01-17 DIAGNOSIS — M545 Low back pain: Secondary | ICD-10-CM | POA: Diagnosis not present

## 2020-01-18 ENCOUNTER — Other Ambulatory Visit: Payer: Self-pay

## 2020-01-18 ENCOUNTER — Ambulatory Visit: Payer: Medicare PPO | Admitting: Internal Medicine

## 2020-01-18 ENCOUNTER — Encounter: Payer: Self-pay | Admitting: Internal Medicine

## 2020-01-18 ENCOUNTER — Ambulatory Visit: Payer: Medicare PPO

## 2020-01-18 DIAGNOSIS — R001 Bradycardia, unspecified: Secondary | ICD-10-CM

## 2020-01-18 DIAGNOSIS — J309 Allergic rhinitis, unspecified: Secondary | ICD-10-CM | POA: Diagnosis not present

## 2020-01-18 DIAGNOSIS — E538 Deficiency of other specified B group vitamins: Secondary | ICD-10-CM | POA: Diagnosis not present

## 2020-01-18 DIAGNOSIS — I4891 Unspecified atrial fibrillation: Secondary | ICD-10-CM | POA: Diagnosis not present

## 2020-01-18 DIAGNOSIS — M810 Age-related osteoporosis without current pathological fracture: Secondary | ICD-10-CM | POA: Diagnosis not present

## 2020-01-18 DIAGNOSIS — F413 Other mixed anxiety disorders: Secondary | ICD-10-CM | POA: Diagnosis not present

## 2020-01-18 NOTE — Assessment & Plan Note (Signed)
Lorazepam prn 

## 2020-01-18 NOTE — Assessment & Plan Note (Signed)
Xarelto In NSR now

## 2020-01-18 NOTE — Assessment & Plan Note (Signed)
Prolia [facility] reviewed

## 2020-01-18 NOTE — Progress Notes (Signed)
Subjective:  Patient ID: Alyssa Luna, female    DOB: 09/18/1933  Age: 86 y.o. MRN: [id number]  CC: No chief complaint on file.   HPI Alyssa Luna presents for a postnasal drip - hoarseness - s/p ENT, GI consults C/o receding gums F/u osteoporhosis, hypothyroidism, A fib  Outpatient Medications Prior to Visit  Medication Sig Dispense Refill  . acetaminophen (TYLENOL) 325 MG tablet Take by mouth.    [name] alum & mag hydroxide-simeth (MAALOX/MYLANTA) 200-200-20 MG/5ML suspension Take by mouth.    . cefUROXime (CEFTIN) 250 MG tablet Take 1 tablet (250 mg total) by mouth 2 (two) times daily with a meal. 20 tablet 0  . cetirizine (ZYRTEC) 10 MG tablet Take 10 mg by mouth daily as needed for allergies.    . chlorhexidine (PERIDEX) 0.12 % solution PLEASE SEE ATTACHED FOR DETAILED DIRECTIONS    . Cholecalciferol (D3 DOTS) 2000 units TBDP Take by mouth daily.    . Cyanocobalamin (VITAMIN B-12 IJ) Inject as directed every 30 (thirty) days.    [name] denosumab (PROLIA) 60 MG/ML SOLN injection Inject 60 mg into the skin every 6 (six) months. Administer in upper arm, thigh, or abdomen Unable to tolerate oral meds Dx severe osteoporosis 1.8 mL 6  . diphenhydrAMINE HCl (BENADRYL ALLERGY CHILDRENS PO) Take by mouth at bedtime as needed. Takes at night when itching    . famotidine (PEPCID) 20 MG tablet Take 1 tablet (20 mg total) by mouth 2 (two) times daily. 180 tablet 3  . gabapentin (NEURONTIN) 600 MG tablet TAKE 1/2 TABLET BY MOUTH 4 TIMES A DAY    . levothyroxine (SYNTHROID) 25 MCG tablet Take 1 tablet (25 mcg total) by mouth daily before breakfast. 90 tablet 3  . LORazepam (ATIVAN) 0.5 MG tablet Take 1-2 tablets (0.5-1 mg total) by mouth 2 (two) times daily as needed for anxiety or sleep. Three months prescription 180 tablet 1  . montelukast (SINGULAIR) 10 MG tablet TAKE 1 TABLET BY MOUTH EVERY DAY 90 tablet 1  . Rivaroxaban (XARELTO) 15 MG TABS tablet Take 15 mg by mouth daily.     .  Simethicone (GAS-X PO) Take 1 tablet by mouth daily as needed (flatulence).     [name] tiZANidine (ZANAFLEX) 2 MG tablet Take 1 tablet (2 mg total) by mouth every 6 (six) hours as needed for muscle spasms. 30 tablet 0  . Wheat Dextrin (BENEFIBER PO) Take 1 tablet by mouth daily.     No facility-administered medications prior to visit.    ROS: Review of Systems  Constitutional: Negative for activity change, appetite change, chills, fatigue and unexpected weight change.  HENT: Positive for postnasal drip, rhinorrhea and sinus pressure. Negative for congestion and mouth sores.   Eyes: Negative for visual disturbance.  Respiratory: Negative for cough and chest tightness.   Gastrointestinal: Negative for abdominal pain and nausea.  Genitourinary: Negative for difficulty urinating, frequency and vaginal pain.  Musculoskeletal: Positive for arthralgias and gait problem. Negative for back pain.  Skin: Negative for pallor and rash.  Neurological: Positive for weakness and numbness. Negative for dizziness, tremors and headaches.  Psychiatric/Behavioral: Negative for confusion and sleep disturbance. The patient is nervous/anxious.     Objective:  BP 132/80 (BP Location: Left Arm, Patient Position: Sitting, Cuff Size: Normal)   Pulse 64   Temp 97.7 F (36.5 C) (Oral)   Ht 5' 2"  (1.575 m)   Wt 121 lb (54.9 kg)   SpO2 95%   BMI 22.13 kg/m  BP Readings from Last 3 Encounters:  01/18/20 132/80  12/14/19 126/72  12/04/19 107/64    Wt Readings from Last 3 Encounters:  01/18/20 121 lb (54.9 kg)  12/14/19 125 lb (56.7 kg)  12/04/19 126 lb (57.2 kg)    Physical Exam Constitutional:      General: She is not in acute distress.    Appearance: She is well-developed.  HENT:     Head: Normocephalic.     Right Ear: External ear normal.     Left Ear: External ear normal.     Nose: Nose normal.  Eyes:     General:        Right eye: No discharge.        Left eye: No discharge.      Conjunctiva/sclera: Conjunctivae normal.     Pupils: Pupils are equal, round, and reactive to light.  Neck:     Thyroid: No thyromegaly.     Vascular: No JVD.     Trachea: No tracheal deviation.  Cardiovascular:     Rate and Rhythm: Normal rate and regular rhythm.     Heart sounds: Normal heart sounds.  Pulmonary:     Effort: No respiratory distress.     Breath sounds: No stridor. No wheezing.  Abdominal:     General: Bowel sounds are normal. There is no distension.     Palpations: Abdomen is soft. There is no mass.     Tenderness: There is no abdominal tenderness. There is no guarding or rebound.  Musculoskeletal:        General: No tenderness.     Cervical back: Normal range of motion and neck supple.  Lymphadenopathy:     Cervical: No cervical adenopathy.  Skin:    Findings: No erythema or rash.  Neurological:     Mental Status: She is oriented to person, place, and time.     Cranial Nerves: No cranial nerve deficit.     Motor: No abnormal muscle tone.     Coordination: Coordination abnormal.     Deep Tendon Reflexes: Reflexes normal.  Psychiatric:        Behavior: Behavior normal.        Thought Content: Thought content normal.        Judgment: Judgment normal.   kyphosis Walker   Lab Results  Component Value Date   WBC 4.6 12/14/2019   HGB 13.9 12/14/2019   HCT 42.3 12/14/2019   PLT 181.0 12/14/2019   GLUCOSE 91 12/14/2019   CHOL 160 02/25/2018   TRIG 119.0 02/25/2018   HDL 42.90 02/25/2018   LDLDIRECT 178.0 09/24/2011   LDLCALC 94 02/25/2018   ALT 13 12/14/2019   AST 23 12/14/2019   NA 142 12/14/2019   K 4.0 12/14/2019   CL 105 12/14/2019   CREATININE 0.72 12/14/2019   BUN 11 12/14/2019   CO2 31 12/14/2019   TSH 2.26 10/17/2019   INR 1.3 (H) 02/07/2019    No results found.  Assessment & Plan:   There are no diagnoses linked to this encounter.   No orders of the defined types were placed in this encounter.    Follow-up: No follow-ups on  file.  Walker Kehr, MD

## 2020-01-18 NOTE — Assessment & Plan Note (Signed)
On B12 

## 2020-01-18 NOTE — Assessment & Plan Note (Signed)
Resolved In NSR now

## 2020-01-18 NOTE — Patient Instructions (Addendum)
Extra Soft Toothbrush - 2 Pcs , Ultra Soft-bristled Adult Toothbrush Micro-Nano 20000 Floss Bristle Good Cleaning Effect for Sensitive Teeth Oral Gum Recession  Irrigate sinuses - Try a squeeze bottle irrigation with normal saline  Read about upright walkers

## 2020-01-18 NOTE — Assessment & Plan Note (Signed)
Try a squeeze bottle irrigation w/NS

## 2020-01-20 DIAGNOSIS — Z91018 Allergy to other foods: Secondary | ICD-10-CM | POA: Diagnosis not present

## 2020-01-25 DIAGNOSIS — M6281 Muscle weakness (generalized): Secondary | ICD-10-CM | POA: Diagnosis not present

## 2020-01-25 DIAGNOSIS — M545 Low back pain: Secondary | ICD-10-CM | POA: Diagnosis not present

## 2020-01-25 DIAGNOSIS — M48061 Spinal stenosis, lumbar region without neurogenic claudication: Secondary | ICD-10-CM | POA: Diagnosis not present

## 2020-01-29 ENCOUNTER — Telehealth: Payer: Self-pay | Admitting: Internal Medicine

## 2020-01-29 DIAGNOSIS — J0100 Acute maxillary sinusitis, unspecified: Secondary | ICD-10-CM

## 2020-01-29 NOTE — Telephone Encounter (Signed)
New Message:   Pt is calling and states she feels as if her sinus infection that she had last month is coming back. She states an antibiotic was given the last time. She would like to know if something can be sent into the pharmacy. Please advise.

## 2020-01-30 ENCOUNTER — Other Ambulatory Visit: Payer: Self-pay

## 2020-01-30 ENCOUNTER — Encounter: Payer: Self-pay | Admitting: Podiatry

## 2020-01-30 ENCOUNTER — Ambulatory Visit: Payer: Medicare PPO | Admitting: Podiatry

## 2020-01-30 VITALS — Temp 96.8°F

## 2020-01-30 DIAGNOSIS — M79676 Pain in unspecified toe(s): Secondary | ICD-10-CM

## 2020-01-30 DIAGNOSIS — M79674 Pain in right toe(s): Secondary | ICD-10-CM

## 2020-01-30 DIAGNOSIS — B351 Tinea unguium: Secondary | ICD-10-CM | POA: Diagnosis not present

## 2020-01-30 DIAGNOSIS — L84 Corns and callosities: Secondary | ICD-10-CM

## 2020-01-30 DIAGNOSIS — Z9229 Personal history of other drug therapy: Secondary | ICD-10-CM

## 2020-01-30 MED ORDER — CEFDINIR 250 MG/5ML PO SUSR: 300.0000 mg | Freq: Two times a day (BID) | ORAL | 0 refills | Status: AC

## 2020-01-30 NOTE — Telephone Encounter (Signed)
Patient informed of below.

## 2020-01-30 NOTE — Telephone Encounter (Signed)
OK Cefdinir Rx sent Thx

## 2020-02-01 DIAGNOSIS — M545 Low back pain: Secondary | ICD-10-CM | POA: Diagnosis not present

## 2020-02-01 DIAGNOSIS — M6281 Muscle weakness (generalized): Secondary | ICD-10-CM | POA: Diagnosis not present

## 2020-02-01 DIAGNOSIS — M48061 Spinal stenosis, lumbar region without neurogenic claudication: Secondary | ICD-10-CM | POA: Diagnosis not present

## 2020-02-06 NOTE — Progress Notes (Addendum)
Subjective: Alyssa Luna is a 86 y.o. female patient seen today long term blood thinner, Xarelto, and presents today with painful, discolored, thick toenails which interfere with daily activities. Patient also has painful corn right 5th digit secondary to hammertoe deformity. She voices no new pedal problems on today's visit.  Past Medical History:  Diagnosis Date  . AF (atrial fibrillation) ([place])   . Allergy    mild   . Alpha galactosidase deficiency   . Anemia   . Anxiety    has lorazepam on hand for nervousness, pt. reports that she had a break-in to her home on 05/2014  . Atrial fibrillation ([place])    D [place]  . Breast cancer ([place]) 1984   left   . Cataract    removed both eyes   . Colon polyps   . Coronary artery disease   . Diverticulosis   . Diverticulosis of colon   . Dysrhythmia    atrial fib  . Esophageal dysmotility   . Familial tremor   . GERD (gastroesophageal reflux disease)   . Hemorrhoids   . Hiatal hernia   . History of breast cancer   . History of hiatal hernia   . History of ischemic colitis   . Hyperlipidemia   . Hypertension   . Internal hemorrhoids   . LBP (low back pain)   . Meningioma ([place])   . Neuromuscular disorder ([place])    essential tremor  . Osteoarthritis    hands & back & knees   . Osteoporosis   . Primary localized osteoarthritis of left knee   . Renal cyst   . [facility] spotted fever   . Schatzki's ring   . Scoliosis   . Stroke [facility])    TIA per pt   . TIA (transient ischemic attack)   . TR (tricuspid regurgitation)    Mild  . Vitamin B12 deficiency   . Vitamin D deficiency     Patient Active Problem List   Diagnosis Date Noted  . Acute sinusitis 12/14/2019  . Anemia 07/19/2019  . Hoarseness of voice 06/21/2019  . Anxiety disorder 04/12/2019  . Chronic venous insufficiency 03/25/2019  . Dark stools 02/24/2019  . Meningioma, cerebral ([place]) 02/16/2019  . Grief reaction 10/18/2018  . Dysfunction of left eustachian  tube 07/07/2018  . Ear pain, left 06/13/2018  . Colitis, acute 03/14/2018  . Constipation 03/14/2018  . Allergy 04/28/2017  . Dysphonia 03/17/2017  . Arthralgia 02/26/2017  . Dyspnea 01/27/2017  . Tick bite 01/27/2017  . Food allergy 01/27/2017  . Ingrown toenail 12/08/2016  . Acute upper respiratory infection 07/13/2016  . [facility] spotted fever) 04/29/2016  . 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 Onychomycosis 02/04/2016  . Osteoporosis, post-menopausal 12/15/2015  . Lumbar radiculopathy 08/14/2015  . Lumbar scoliosis 08/14/2015  . Long term current use of anticoagulant therapy 07/18/2015  . Dysuria 06/16/2015  . Allergic rhinitis 06/13/2015  . Closed wedge compression fracture of fourth lumbar vertebra ([place])   . Thrush, oral 01/04/2015  . Itching 01/04/2015  . Hypokalemia 11/06/2014  . Fatigue 10/19/2014  . LLQ abdominal pain 10/02/2014  . Swelling of left knee joint 10/02/2014  . Nausea without vomiting 10/02/2014  . DJD (degenerative joint disease) of knee 08/27/2014  . Primary localized osteoarthritis of left knee   . Breast cancer ([place])   . GERD (gastroesophageal reflux disease)   . Preop exam for internal medicine 06/13/2014  . Anxiety state 06/13/2014  . Bradycardia 11/09/2013  .  Essential hypertension 11/09/2013  . Encounter for therapeutic drug monitoring 09/08/2013  . Well adult exam 05/21/2013  . Preop cardiovascular exam 05/08/2013  . Tremor, essential 12/14/2012  . Right hip pain 05/10/2012  . Vertigo 03/09/2012  . Dyslipidemia 03/31/2011  . Meningioma ([place]) 02/03/2011  . TIA (transient ischemic attack) 11/13/2010  . Abnormal CT of brain 11/13/2010  . CAROTID BRUIT 07/28/2010  . Edema 05/02/2010  . Atrial fibrillation ([place]) 04/08/2010  . SYNCOPE 03/31/2010  . LOW BACK PAIN 06/05/2009  . Chronic maxillary sinusitis 01/31/2009  . EFFUSION OF JOINT OTHER SPECIFIED SITE 01/31/2009  . Diarrhea 05/31/2008  . ABNORMAL CHEST XRAY 05/15/2008  .  HEMATURIA, MICROSCOPIC, HX OF 08/04/2007  . B12 deficiency 03/21/2007  . Vitamin D deficiency 03/21/2007  . Disease of tricuspid valve 03/21/2007  . DIVERTICULOSIS, COLON 03/21/2007  . Osteoarthritis 03/21/2007  . Osteoporosis 03/21/2007  . Personal history of malignant neoplasm of breast 03/21/2007  . COLONIC POLYPS, HX OF 03/21/2007    Current Outpatient Medications on File Prior to Visit  Medication Sig Dispense Refill  . acetaminophen (TYLENOL) 325 MG tablet Take by mouth.    [name] alum & mag hydroxide-simeth (MAALOX/MYLANTA) 200-200-20 MG/5ML suspension Take by mouth.    . cefdinir (OMNICEF) 250 MG/5ML suspension Take 6 mLs (300 mg total) by mouth 2 (two) times daily for 7 days. 84 mL 0  . cetirizine (ZYRTEC) 10 MG tablet Take 10 mg by mouth daily as needed for allergies.    . chlorhexidine (PERIDEX) 0.12 % solution PLEASE SEE ATTACHED FOR DETAILED DIRECTIONS    . Cholecalciferol (D3 DOTS) 2000 units TBDP Take by mouth daily.    . Cyanocobalamin (VITAMIN B-12 IJ) Inject as directed every 30 (thirty) days.    [name] denosumab (PROLIA) 60 MG/ML SOLN injection Inject 60 mg into the skin every 6 (six) months. Administer in upper arm, thigh, or abdomen Unable to tolerate oral meds Dx severe osteoporosis 1.8 mL 6  . diphenhydrAMINE HCl (BENADRYL ALLERGY CHILDRENS PO) Take by mouth at bedtime as needed. Takes at night when itching    . famotidine (PEPCID) 20 MG tablet Take 1 tablet (20 mg total) by mouth 2 (two) times daily. 180 tablet 3  . fluconazole (DIFLUCAN) 150 MG tablet     . gabapentin (NEURONTIN) 600 MG tablet TAKE 1/2 TABLET BY MOUTH 4 TIMES A DAY    . levothyroxine (SYNTHROID) 25 MCG tablet Take 1 tablet (25 mcg total) by mouth daily before breakfast. 90 tablet 3  . lidocaine (LIDODERM) 5 % Apply patch to painful area. Patch may remain in place for up to 12 hours in a 24 hour period.    [name] LORazepam (ATIVAN) 0.5 MG tablet Take 1-2 tablets (0.5-1 mg total) by mouth 2 (two) times daily as  needed for anxiety or sleep. Three months prescription 180 tablet 1  . montelukast (SINGULAIR) 10 MG tablet TAKE 1 TABLET BY MOUTH EVERY DAY 90 tablet 1  . Rivaroxaban (XARELTO) 15 MG TABS tablet Take 15 mg by mouth daily.     . Simethicone (GAS-X PO) Take 1 tablet by mouth daily as needed (flatulence).     [name] tiZANidine (ZANAFLEX) 2 MG tablet Take 1 tablet (2 mg total) by mouth every 6 (six) hours as needed for muscle spasms. 30 tablet 0  . Wheat Dextrin (BENEFIBER PO) Take 1 tablet by mouth daily.     No current facility-administered medications on file prior to visit.    Allergies  Allergen Reactions  . Alpha-Gal  Itching  . Beef (Bovine) Protein Other (See Comments)    Alpha-gal allergy testing positive June 2018  . Galactose Nausea Only    Per patient, cannot have alpha-gal since tic bite episode.  . Pravastatin Anaphylaxis and Other (See Comments)    Legs ache  . Amlodipine     Leg swelling  . Colchicine Other (See Comments)  . Doxycycline Other (See Comments)    Nausea - able to take w/food  . Shellfish Allergy Other (See Comments)    Unknown  . Tikosyn [Dofetilide] Other (See Comments)    Unknown reaction and was told never to take it due to problems during sleep   . Actonel [Risedronate Sodium] Other (See Comments)    Not known  . Alendronate Other (See Comments)    NOT KNOWN  . Augmentin [Amoxicillin-Pot Clavulanate] Rash    Has patient had a PCN reaction causing immediate rash, facial/tongue/throat swelling, SOB or lightheadedness with hypotension: Yes Has patient had a PCN reaction causing severe rash involving mucus membranes or skin necrosis:No Has patient had a PCN reaction that required hospitalization: No Has patient had a PCN reaction occurring within the last 10 years: No If all of the above answers are "NO", then may proceed with Cephalosporin use.   . Ciprofloxacin Nausea Only  . Evista [Raloxifene] Other (See Comments)    Unknown reaction  . Flagyl  [Metronidazole] Other (See Comments)    Not known  . Fosamax [Alendronate Sodium] Other (See Comments)    NOT KNOWN  . Gold-Containing Drug Products Other (See Comments)    Per Dr  . Loma Sousa [Escitalopram Oxalate] Other (See Comments)    shaky  . Risedronate Other (See Comments)    Unknown reaction  . Simvastatin Other (See Comments)    REACTION: leg cramps, weakness  . Tramadol Other (See Comments)    Mental disturbance changes    Objective: Physical Exam  General: Patient is a pleasant 86 y.o. Caucasian female WD, WN in NAD. AAO x 3.   Neurovascular Examination: Capillary refill time to digits immediate b/l. Palpable pedal pulses b/l LE. Pedal hair sparse. Lower extremity skin temperature gradient within normal limits.   Protective sensation intact 5/5 intact bilaterally with 10g monofilament b/l. Vibratory sensation intact b/l. Proprioception intact bilaterally.  Dermatological:  Pedal skin with normal turgor, texture and tone bilaterally. No open wounds bilaterally. No interdigital macerations bilaterally. Toenails 1-5 b/l elongated, discolored, dystrophic, thickened, crumbly with subungual debris and tenderness to dorsal palpation. Hyperkeratotic lesion(s) R 5th toe.  No erythema, no edema, no drainage, no flocculence.  Musculoskeletal:  Normal muscle strength 5/5 to all lower extremity muscle groups bilaterally. No pain crepitus or joint limitation noted with ROM b/l. Hammertoes noted to the R 5th toe.  Assessment and Plan:  1. Pain due to onychomycosis of nail   2. Corns   3. Pain of toe of right foot   4. Hx of long term use of blood thinners    -Examined patient. -No new findings. No new orders. -Toenails 1-5 b/l were debrided in length and girth with sterile nail nippers and dremel without iatrogenic bleeding.  -Corn(s) R 5th toe pared utilizing sterile scalpel blade without complication or incident. Total number debrided=1. -Patient to report any pedal injuries to  medical professional immediately. -Dispensed Silipos toe cap for right 5th digit. Apply every morning. Remove every evening. -Patient to continue soft, supportive shoe gear daily. -Patient/POA to call should there be question/concern in the interim.  Return in about  10 weeks (around 04/09/2020) for nail and callus trim/Plavix.  Marzetta Board, DPM

## 2020-02-06 NOTE — Telephone Encounter (Addendum)
   Patient calling to report cefdinir (OMNICEF) 250 MG/5ML suspension has caused her to feel nauseous. Patient wants to change to a previous medication given

## 2020-02-07 ENCOUNTER — Ambulatory Visit: Payer: Medicare PPO | Admitting: Specialist

## 2020-02-07 MED ORDER — CEFUROXIME AXETIL 250 MG PO TABS: 250.0000 mg | ORAL_TABLET | Freq: Two times a day (BID) | ORAL | 0 refills | Status: DC

## 2020-02-07 NOTE — Addendum Note (Signed)
Addended by: Cassandria Anger on: 02/07/2020 07:31 AM   Modules accepted: Orders

## 2020-02-07 NOTE — Telephone Encounter (Signed)
Ok -a prescription for Ceftin tablets was emailed.  Thanks

## 2020-02-07 NOTE — Telephone Encounter (Signed)
Pt informed of below.  

## 2020-02-08 ENCOUNTER — Ambulatory Visit (INDEPENDENT_AMBULATORY_CARE_PROVIDER_SITE_OTHER): Payer: Medicare PPO | Admitting: Specialist

## 2020-02-08 ENCOUNTER — Encounter: Payer: Self-pay | Admitting: Specialist

## 2020-02-08 ENCOUNTER — Other Ambulatory Visit: Payer: Self-pay

## 2020-02-08 VITALS — BP 145/71 | HR 53 | Ht 62.0 in | Wt 121.0 lb

## 2020-02-08 DIAGNOSIS — M4125 Other idiopathic scoliosis, thoracolumbar region: Secondary | ICD-10-CM | POA: Diagnosis not present

## 2020-02-08 DIAGNOSIS — M48061 Spinal stenosis, lumbar region without neurogenic claudication: Secondary | ICD-10-CM

## 2020-02-08 DIAGNOSIS — M1611 Unilateral primary osteoarthritis, right hip: Secondary | ICD-10-CM

## 2020-02-08 DIAGNOSIS — M8000XA Age-related osteoporosis with current pathological fracture, unspecified site, initial encounter for fracture: Secondary | ICD-10-CM | POA: Diagnosis not present

## 2020-02-08 MED ORDER — DULOXETINE HCL 20 MG PO CPEP: 20.0000 mg | ORAL_CAPSULE | Freq: Every day | ORAL | 3 refills | Status: DC

## 2020-02-08 NOTE — Progress Notes (Signed)
Office Visit Note   Patient: Alyssa Luna           Date of Birth: 1933-10-25           MRN: [id number] Visit Date: 02/08/2020              Requested by: Cassandria Anger, MD [address],  [place] [id number] PCP: Plotnikov, Evie Lacks, MD   Assessment & Plan: Visit Diagnoses:  1. Spinal stenosis of lumbar region without neurogenic claudication   2. Age-related osteoporosis with current pathological fracture, initial encounter   3. Unilateral primary osteoarthritis, right hip   4. Idiopathic scoliosis of thoracolumbar region     Plan: Avoid bending, stooping and avoid lifting weights greater than 10 lbs. Avoid prolong standing and walking. Avoid frequent bending and stooping  No lifting greater than 10 lbs. May use ice or moist heat for pain. Weight loss is of benefit. Handicap license is approved. Has a fib but HR is 50s so will try low dose of duloxetine for musculoskeletal pain. Hemp CBD is okay if it seems to help. Hot shower one or two times per day. Check heart rate 2-3 times a day if heart rate is greater than 80-90 then discontinue the duloxetine(cymbalta).  Follow-Up Instructions: No follow-ups on file.   Orders:  No orders of the defined types were placed in this encounter.  Meds ordered this encounter  Medications  . DULoxetine (CYMBALTA) 20 MG capsule    Sig: Take 1 capsule (20 mg total) by mouth daily.    Dispense:  30 capsule    Refill:  3      Procedures: No procedures performed   Clinical Data: No additional findings.   Subjective: Chief Complaint  Patient presents with  . Lower Back - Follow-up    86 year old female with history of thoracolumbar kyphoplasties for compression fractures T10, T12 and L1. She now resides at the [facility] in the assisted living apartment area. She is pleased with the accommodations. She is experiencing pain in the lumbar spine. Takes tylenol and gabapentin. The tylenol causes the legs to  burn, stopping it's use helped. Takes 600 mg of gabapentin 1/2 tid and 1 tablet at bedtime. She is doing exercises there and she has some discomfort. Takes xarelto so is unable to take NSAIDs. Wondering if there is anything else that may be taken for discomfort. She tried Hemp CBD 6000 mg. She is not sure it is of benefit.    Review of Systems  Constitutional: Negative.   HENT: Negative.   Eyes: Negative.   Respiratory: Negative.   Cardiovascular: Negative.   Gastrointestinal: Negative.   Endocrine: Negative.   Genitourinary: Negative.   Musculoskeletal: Negative.   Skin: Negative.   Allergic/Immunologic: Negative.   Neurological: Negative.   Hematological: Negative.   Psychiatric/Behavioral: Negative.      Objective: Vital Signs: BP (!) 145/71 (BP Location: Left Arm, Patient Position: Sitting)   Pulse (!) 53   Ht 5' 2"  (1.575 m)   Wt 121 lb (54.9 kg)   BMI 22.13 kg/m   Physical Exam  Ortho Exam  Specialty Comments:  No specialty comments available.  Imaging: No results found.   PMFS History: Patient Active Problem List   Diagnosis Date Noted  . Acute sinusitis 12/14/2019  . Anemia 07/19/2019  . Hoarseness of voice 06/21/2019  . Anxiety disorder 04/12/2019  . Chronic venous insufficiency 03/25/2019  . Dark stools 02/24/2019  . Meningioma, cerebral (  [place]) 02/16/2019  . Grief reaction 10/18/2018  . Dysfunction of left eustachian tube 07/07/2018  . Ear pain, left 06/13/2018  . Colitis, acute 03/14/2018  . Constipation 03/14/2018  . Allergy 04/28/2017  . Dysphonia 03/17/2017  . Arthralgia 02/26/2017  . Dyspnea 01/27/2017  . Tick bite 01/27/2017  . Food allergy 01/27/2017  . Ingrown toenail 12/08/2016  . Acute upper respiratory infection 07/13/2016  . RMSF [facility] spotted fever) 04/29/2016  . 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 Onychomycosis 02/04/2016  . Osteoporosis, post-menopausal 12/15/2015  . Lumbar radiculopathy 08/14/2015  . Lumbar  scoliosis 08/14/2015  . Long term current use of anticoagulant therapy 07/18/2015  . Dysuria 06/16/2015  . Allergic rhinitis 06/13/2015  . Closed wedge compression fracture of fourth lumbar vertebra ([place])   . Thrush, oral 01/04/2015  . Itching 01/04/2015  . Hypokalemia 11/06/2014  . Fatigue 10/19/2014  . LLQ abdominal pain 10/02/2014  . Swelling of left knee joint 10/02/2014  . Nausea without vomiting 10/02/2014  . DJD (degenerative joint disease) of knee 08/27/2014  . Primary localized osteoarthritis of left knee   . Breast cancer ([place])   . GERD (gastroesophageal reflux disease)   . Preop exam for internal medicine 06/13/2014  . Anxiety state 06/13/2014  . Bradycardia 11/09/2013  . Essential hypertension 11/09/2013  . Encounter for therapeutic drug monitoring 09/08/2013  . Well adult exam 05/21/2013  . Preop cardiovascular exam 05/08/2013  . Tremor, essential 12/14/2012  . Right hip pain 05/10/2012  . Vertigo 03/09/2012  . Dyslipidemia 03/31/2011  . Meningioma ([place]) 02/03/2011  . TIA (transient ischemic attack) 11/13/2010  . Abnormal CT of brain 11/13/2010  . CAROTID BRUIT 07/28/2010  . Edema 05/02/2010  . Atrial fibrillation ([place]) 04/08/2010  . SYNCOPE 03/31/2010  . LOW BACK PAIN 06/05/2009  . Chronic maxillary sinusitis 01/31/2009  . EFFUSION OF JOINT OTHER SPECIFIED SITE 01/31/2009  . Diarrhea 05/31/2008  . ABNORMAL CHEST XRAY 05/15/2008  . HEMATURIA, MICROSCOPIC, HX OF 08/04/2007  . B12 deficiency 03/21/2007  . Vitamin D deficiency 03/21/2007  . Disease of tricuspid valve 03/21/2007  . DIVERTICULOSIS, COLON 03/21/2007  . Osteoarthritis 03/21/2007  . Osteoporosis 03/21/2007  . Personal history of malignant neoplasm of breast 03/21/2007  . COLONIC POLYPS, HX OF 03/21/2007   Past Medical History:  Diagnosis Date  . AF (atrial fibrillation) ([place])   . Allergy    mild   . Alpha galactosidase deficiency   . Anemia   . Anxiety    has lorazepam on hand for  nervousness, pt. reports that she had a break-in to her home on 05/2014  . Atrial fibrillation ([place])    D Taylor  . Breast cancer ([place]) 1984   left   . Cataract    removed both eyes   . Colon polyps   . Coronary artery disease   . Diverticulosis   . Diverticulosis of colon   . Dysrhythmia    atrial fib  . Esophageal dysmotility   . Familial tremor   . GERD (gastroesophageal reflux disease)   . Hemorrhoids   . Hiatal hernia   . History of breast cancer   . History of hiatal hernia   . History of ischemic colitis   . Hyperlipidemia   . Hypertension   . Internal hemorrhoids   . LBP (low back pain)   . Meningioma ([place])   . Neuromuscular disorder ([place])    essential tremor  . Osteoarthritis    hands & back & knees   . Osteoporosis   .  Primary localized osteoarthritis of left knee   . Renal cyst   . [facility] spotted fever   . Schatzki's ring   . Scoliosis   . Stroke [facility])    TIA per pt   . TIA (transient ischemic attack)   . TR (tricuspid regurgitation)    Mild  . Vitamin B12 deficiency   . Vitamin D deficiency     Family History  Problem Relation Age of Onset  . Breast cancer Mother 38  . Tremor Mother   . Tremor Brother   . Colon cancer Maternal Aunt   . Ovarian cancer Maternal Grandmother   . Early death Neg Hx   . Stroke Neg Hx   . Esophageal cancer Neg Hx   . Stomach cancer Neg Hx   . Pancreatic cancer Neg Hx   . Liver disease Neg Hx   . Inflammatory bowel disease Neg Hx   . Colon polyps Neg Hx   . Rectal cancer Neg Hx     Past Surgical History:  Procedure Laterality Date  . AUGMENTATION MAMMAPLASTY    . BLEPHAROPLASTY Bilateral   . CATARACT EXTRACTION, BILATERAL    . COLONOSCOPY    . gamma knife procedure  02/2019   to help the tremor at [place] LUMBAR  02/07/2019  . IR KYPHO LUMBAR INC FX REDUCE BONE BX UNI/BIL CANNULATION INC/IMAGING  02/07/2019  . JOINT REPLACEMENT Right   . kytoplasy    .  MASTECTOMY Bilateral 1984  . OOPHORECTOMY     BSO  . POLYPECTOMY    . TOTAL KNEE ARTHROPLASTY  Dec 2011   Right - Dr Noemi Chapel  . TOTAL KNEE ARTHROPLASTY Left 08/27/2014   Procedure: LEFT TOTAL KNEE ARTHROPLASTY;  Surgeon: Lorn Junes, MD;  Location: [place];  Service: Orthopedics;  Laterality: Left;  . UPPER GASTROINTESTINAL ENDOSCOPY    . VAGINAL HYSTERECTOMY     LAVH BSO   Social History   Occupational History  . Occupation: Retired    Fish farm manager: RETIRED  . Occupation: potter  Tobacco Use  . Smoking status: Never Smoker  . Smokeless tobacco: Never Used  Vaping Use  . Vaping Use: Never used  Substance and Sexual Activity  . Alcohol use: No    Alcohol/week: 0.0 standard drinks  . Drug use: No  . Sexual activity: Never    Birth control/protection: Surgical, Post-menopausal    Comment: HYST

## 2020-02-08 NOTE — Patient Instructions (Addendum)
Avoid bending, stooping and avoid lifting weights greater than 10 lbs. Avoid prolong standing and walking. Avoid frequent bending and stooping  No lifting greater than 10 lbs. May use ice or moist heat for pain. Weight loss is of benefit. Handicap license is approved. Has a fib but HR is 50s so will try low dose of duloxetine for musculoskeletal pain. Hemp CBD is okay if it seems to help. Hot shower one or two times per day. Check heart rate 2-3 times a day if heart rate is greater than 80-90 then discontinue the duloxetine(cymbalta). Check with your SNF if a personnal mobility device like a scooter can be used, I would recommend for long distance ambulation.

## 2020-02-13 DIAGNOSIS — I480 Paroxysmal atrial fibrillation: Secondary | ICD-10-CM | POA: Diagnosis not present

## 2020-02-21 DIAGNOSIS — Z91018 Allergy to other foods: Secondary | ICD-10-CM | POA: Diagnosis not present

## 2020-02-23 ENCOUNTER — Ambulatory Visit (INDEPENDENT_AMBULATORY_CARE_PROVIDER_SITE_OTHER): Payer: Medicare PPO

## 2020-02-23 ENCOUNTER — Other Ambulatory Visit: Payer: Self-pay

## 2020-02-23 DIAGNOSIS — E538 Deficiency of other specified B group vitamins: Secondary | ICD-10-CM | POA: Diagnosis not present

## 2020-02-23 DIAGNOSIS — I480 Paroxysmal atrial fibrillation: Secondary | ICD-10-CM | POA: Diagnosis not present

## 2020-02-23 MED ORDER — CYANOCOBALAMIN 1000 MCG/ML IJ SOLN
1000.0000 ug | INTRAMUSCULAR | Status: DC
  Administered 2020-02-23 – 2021-02-12 (×7): 1000 ug via INTRAMUSCULAR

## 2020-02-23 NOTE — Progress Notes (Signed)
Medical treatment/procedure(s) were performed by non-physician practitioner and as supervising physician I was immediately available for consultation/collaboration. I agree with above. Mertis Mosher A Blanch Stang, MD  

## 2020-02-23 NOTE — Progress Notes (Signed)
Pt here for monthly B12 injection per Dr Alain Marion.  B12 1023mg given IM right deltoid and pt tolerated injection well.  Pt to schedule next monthly injection upon check out.  Dr CSharlet Salina can you sign this for me since Dr PAlain Marionis out of office today?

## 2020-03-11 DIAGNOSIS — I480 Paroxysmal atrial fibrillation: Secondary | ICD-10-CM | POA: Diagnosis not present

## 2020-03-15 ENCOUNTER — Other Ambulatory Visit: Payer: Self-pay

## 2020-03-15 ENCOUNTER — Ambulatory Visit: Payer: Self-pay

## 2020-03-15 ENCOUNTER — Ambulatory Visit: Payer: Medicare PPO | Admitting: Specialist

## 2020-03-15 ENCOUNTER — Encounter: Payer: Self-pay | Admitting: Specialist

## 2020-03-15 VITALS — BP 141/72 | HR 58 | Ht 62.0 in | Wt 125.0 lb

## 2020-03-15 DIAGNOSIS — M8000XA Age-related osteoporosis with current pathological fracture, unspecified site, initial encounter for fracture: Secondary | ICD-10-CM | POA: Diagnosis not present

## 2020-03-15 DIAGNOSIS — M4125 Other idiopathic scoliosis, thoracolumbar region: Secondary | ICD-10-CM

## 2020-03-15 DIAGNOSIS — M48061 Spinal stenosis, lumbar region without neurogenic claudication: Secondary | ICD-10-CM | POA: Diagnosis not present

## 2020-03-15 NOTE — Patient Instructions (Signed)
Plan: Avoid bending, stooping and avoid lifting weights greater than 10 lbs. Avoid prolong standing and walking. Avoid frequent bending and stooping  No lifting greater than 10 lbs. May use ice or moist heat for pain. Weight loss is of benefit. Handicap license is approved. A spinal cord stimulator is a possible solution for increasing pain without major lumbar decompression and fusion due to the degree of  Lumbar scoliosis. Surgeries directed at decompression of nerves in areas where scoliosis is present often are unsuccessful without fusion of the scoliosis. In the face of osteoporosis and weak bone you are not a candidate for a fusion.  Takes advil occasionally and may try voltaren gel.  Discuss with your cardiologist it you may switch to aspirin low dose and consider low level antiarthritis medications.  Hot showers in the AM.  Injection with steroid may be of benefit. Hemp CBD capsules, [email or web address] 5,000-7,000 mg per bottle, 60 capsules per bottle, take one capsule twice a day.  Follow-Up Instructions: No follow-ups on file.  Follow-Up Instructions: No follow-ups on file.

## 2020-03-15 NOTE — Progress Notes (Signed)
Office Visit Note   Patient: Alyssa Luna           Date of Birth: 09/24/1933           MRN: [id number] Visit Date: 03/15/2020              Requested by: Cassandria Anger, MD [place],  [place] [address] PCP: Plotnikov, Evie Lacks, MD   Assessment & Plan: Visit Diagnoses:  1. Spinal stenosis of lumbar region without neurogenic claudication   2. Age-related osteoporosis with current pathological fracture, initial encounter   3. Idiopathic scoliosis of thoracolumbar region     Plan: Avoid bending, stooping and avoid lifting weights greater than 10 lbs. Avoid prolong standing and walking. Avoid frequent bending and stooping  No lifting greater than 10 lbs. May use ice or moist heat for pain. Weight loss is of benefit. Handicap license is approved. A spinal cord stimulator is a possible solution for increasing pain without major lumbar decompression and fusion due to the degree of  Lumbar scoliosis. Surgeries directed at decompression of nerves in areas where scoliosis is present often are unsuccessful without fusion of the scoliosis. In the face of osteoporosis and weak bone you are not a candidate for a fusion.  Takes advil occasionally and may try voltaren gel.  Discuss with your cardiologist it you may switch to aspirin low dose and consider low level antiarthritis medications.  Hot showers in the AM.  Injection with steroid may be of benefit. Hemp CBD capsules, [email or web address] 5,000-7,000 mg per bottle, 60 capsules per bottle, take one capsule twice a day.  Follow-Up Instructions: No follow-ups on file.  Follow-Up Instructions: No follow-ups on file.   Orders:  Orders Placed This Encounter  Procedures  . XR Lumbar Spine 2-3 Views   No orders of the defined types were placed in this encounter.     Procedures: No procedures performed   Clinical Data: No additional findings.   Subjective: Chief Complaint  Patient presents with  . Lower Back -  Follow-up    86 year old female with history of thoracolumbar fractures and kyphoplasty procedures T10, T12 and L1. She has lumbar spinal stenosis And has been to PT for exercises. She has had some episodes of weakness in the legs associated with therapy and has had some visits with her cardiologist and electrophysiologist to assess the cause of increased PVC and was told that she has monitoring limitations that  Affect the rate monitoring and the heart rate. She was determined to not have atrial fibrillation but a different cause for irratic heart rate. She is taking gabapentin   Review of Systems  Constitutional: Negative.   HENT: Negative.   Eyes: Negative.   Respiratory: Negative.   Cardiovascular: Negative.   Gastrointestinal: Negative.   Endocrine: Negative.   Genitourinary: Negative.   Musculoskeletal: Negative.   Skin: Negative.   Allergic/Immunologic: Negative.   Neurological: Negative.   Hematological: Negative.   Psychiatric/Behavioral: Negative.      Objective: Vital Signs: BP (!) 141/72 (BP Location: Left Arm, Patient Position: Sitting)   Pulse 58   Ht 5' 2"  (1.575 m)   Wt 125 lb (56.7 kg)   BMI 22.86 kg/m   Physical Exam Musculoskeletal:     Lumbar back: Negative right straight leg raise test and negative left straight leg raise test.     Back Exam   Tenderness  The patient is experiencing tenderness in the lumbar.  Range of Motion  Extension: abnormal  Flexion: abnormal  Lateral bend right: abnormal  Lateral bend left: abnormal  Rotation right: abnormal  Rotation left: abnormal   Muscle Strength  Right Quadriceps:  5/5  Left Quadriceps:  5/5  Right Hamstrings:  5/5  Left Hamstrings:  5/5   Tests  Straight leg raise right: negative Straight leg raise left: negative  Reflexes  Patellar: 2/4 Achilles: 2/4  Other  Toe walk: normal Heel walk: normal Sensation: normal Erythema: no back redness Scars: absent      Specialty Comments:   No specialty comments available.  Imaging: No results found.   PMFS History: Patient Active Problem List   Diagnosis Date Noted  . Acute sinusitis 12/14/2019  . Anemia 07/19/2019  . Hoarseness of voice 06/21/2019  . Anxiety disorder 04/12/2019  . Chronic venous insufficiency 03/25/2019  . Dark stools 02/24/2019  . Meningioma, cerebral ([place]) 02/16/2019  . Grief reaction 10/18/2018  . Dysfunction of left eustachian tube 07/07/2018  . Ear pain, left 06/13/2018  . Colitis, acute 03/14/2018  . Constipation 03/14/2018  . Allergy 04/28/2017  . Dysphonia 03/17/2017  . Arthralgia 02/26/2017  . Dyspnea 01/27/2017  . Tick bite 01/27/2017  . Food allergy 01/27/2017  . Ingrown toenail 12/08/2016  . Acute upper respiratory infection 07/13/2016  . RMSF [facility] spotted fever) 04/29/2016  . 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 Onychomycosis 02/04/2016  . Osteoporosis, post-menopausal 12/15/2015  . Lumbar radiculopathy 08/14/2015  . Lumbar scoliosis 08/14/2015  . Long term current use of anticoagulant therapy 07/18/2015  . Dysuria 06/16/2015  . Allergic rhinitis 06/13/2015  . Closed wedge compression fracture of fourth lumbar vertebra ([place])   . Thrush, oral 01/04/2015  . Itching 01/04/2015  . Hypokalemia 11/06/2014  . Fatigue 10/19/2014  . LLQ abdominal pain 10/02/2014  . Swelling of left knee joint 10/02/2014  . Nausea without vomiting 10/02/2014  . DJD (degenerative joint disease) of knee 08/27/2014  . Primary localized osteoarthritis of left knee   . Breast cancer ([place])   . GERD (gastroesophageal reflux disease)   . Preop exam for internal medicine 06/13/2014  . Anxiety state 06/13/2014  . Bradycardia 11/09/2013  . Essential hypertension 11/09/2013  . Encounter for therapeutic drug monitoring 09/08/2013  . Well adult exam 05/21/2013  . Preop cardiovascular exam 05/08/2013  . Tremor, essential 12/14/2012  . Right hip pain 05/10/2012  . Vertigo 03/09/2012  .  Dyslipidemia 03/31/2011  . Meningioma ([place]) 02/03/2011  . TIA (transient ischemic attack) 11/13/2010  . Abnormal CT of brain 11/13/2010  . CAROTID BRUIT 07/28/2010  . Edema 05/02/2010  . Atrial fibrillation ([place]) 04/08/2010  . SYNCOPE 03/31/2010  . LOW BACK PAIN 06/05/2009  . Chronic maxillary sinusitis 01/31/2009  . EFFUSION OF JOINT OTHER SPECIFIED SITE 01/31/2009  . Diarrhea 05/31/2008  . ABNORMAL CHEST XRAY 05/15/2008  . HEMATURIA, MICROSCOPIC, HX OF 08/04/2007  . B12 deficiency 03/21/2007  . Vitamin D deficiency 03/21/2007  . Disease of tricuspid valve 03/21/2007  . DIVERTICULOSIS, COLON 03/21/2007  . Osteoarthritis 03/21/2007  . Osteoporosis 03/21/2007  . Personal history of malignant neoplasm of breast 03/21/2007  . COLONIC POLYPS, HX OF 03/21/2007   Past Medical History:  Diagnosis Date  . AF (atrial fibrillation) ([place])   . Allergy    mild   . Alpha galactosidase deficiency   . Anemia   . Anxiety    has lorazepam on hand for nervousness, pt. reports that she had a break-in to her home on 05/2014  . Atrial fibrillation ([place])  Fredrich Birks  . Breast cancer ([place]) 1984   left   . Cataract    removed both eyes   . Colon polyps   . Coronary artery disease   . Diverticulosis   . Diverticulosis of colon   . Dysrhythmia    atrial fib  . Esophageal dysmotility   . Familial tremor   . GERD (gastroesophageal reflux disease)   . Hemorrhoids   . Hiatal hernia   . History of breast cancer   . History of hiatal hernia   . History of ischemic colitis   . Hyperlipidemia   . Hypertension   . Internal hemorrhoids   . LBP (low back pain)   . Meningioma ([place])   . Neuromuscular disorder ([place])    essential tremor  . Osteoarthritis    hands & back & knees   . Osteoporosis   . Primary localized osteoarthritis of left knee   . Renal cyst   . [facility] spotted fever   . Schatzki's ring   . Scoliosis   . Stroke [facility])    TIA per pt   . TIA (transient ischemic attack)    . TR (tricuspid regurgitation)    Mild  . Vitamin B12 deficiency   . Vitamin D deficiency     Family History  Problem Relation Age of Onset  . Breast cancer Mother 23  . Tremor Mother   . Tremor Brother   . Colon cancer Maternal Aunt   . Ovarian cancer Maternal Grandmother   . Early death Neg Hx   . Stroke Neg Hx   . Esophageal cancer Neg Hx   . Stomach cancer Neg Hx   . Pancreatic cancer Neg Hx   . Liver disease Neg Hx   . Inflammatory bowel disease Neg Hx   . Colon polyps Neg Hx   . Rectal cancer Neg Hx     Past Surgical History:  Procedure Laterality Date  . AUGMENTATION MAMMAPLASTY    . BLEPHAROPLASTY Bilateral   . CATARACT EXTRACTION, BILATERAL    . COLONOSCOPY    . gamma knife procedure  02/2019   to help the tremor at [place] LUMBAR  02/07/2019  . IR KYPHO LUMBAR INC FX REDUCE BONE BX UNI/BIL CANNULATION INC/IMAGING  02/07/2019  . JOINT REPLACEMENT Right   . kytoplasy    . MASTECTOMY Bilateral 1984  . OOPHORECTOMY     BSO  . POLYPECTOMY    . TOTAL KNEE ARTHROPLASTY  Dec 2011   Right - Dr Noemi Chapel  . TOTAL KNEE ARTHROPLASTY Left 08/27/2014   Procedure: LEFT TOTAL KNEE ARTHROPLASTY;  Surgeon: Lorn Junes, MD;  Location: [place];  Service: Orthopedics;  Laterality: Left;  . UPPER GASTROINTESTINAL ENDOSCOPY    . VAGINAL HYSTERECTOMY     LAVH BSO   Social History   Occupational History  . Occupation: Retired    Fish farm manager: RETIRED  . Occupation: potter  Tobacco Use  . Smoking status: Never Smoker  . Smokeless tobacco: Never Used  Vaping Use  . Vaping Use: Never used  Substance and Sexual Activity  . Alcohol use: No    Alcohol/week: 0.0 standard drinks  . Drug use: No  . Sexual activity: Never    Birth control/protection: Surgical, Post-menopausal    Comment: HYST

## 2020-03-20 ENCOUNTER — Other Ambulatory Visit: Payer: Self-pay | Admitting: Internal Medicine

## 2020-03-25 ENCOUNTER — Ambulatory Visit: Payer: Medicare PPO

## 2020-03-27 DIAGNOSIS — I480 Paroxysmal atrial fibrillation: Secondary | ICD-10-CM | POA: Diagnosis not present

## 2020-03-27 DIAGNOSIS — E785 Hyperlipidemia, unspecified: Secondary | ICD-10-CM | POA: Diagnosis not present

## 2020-03-27 DIAGNOSIS — I10 Essential (primary) hypertension: Secondary | ICD-10-CM | POA: Diagnosis not present

## 2020-04-03 ENCOUNTER — Ambulatory Visit (INDEPENDENT_AMBULATORY_CARE_PROVIDER_SITE_OTHER): Payer: Medicare PPO | Admitting: *Deleted

## 2020-04-03 ENCOUNTER — Other Ambulatory Visit: Payer: Self-pay

## 2020-04-03 DIAGNOSIS — E538 Deficiency of other specified B group vitamins: Secondary | ICD-10-CM

## 2020-04-03 MED ORDER — CYANOCOBALAMIN 1000 MCG/ML IJ SOLN
1000.0000 ug | Freq: Once | INTRAMUSCULAR | Status: AC
  Administered 2020-04-03: 1000 ug via INTRAMUSCULAR

## 2020-04-03 NOTE — Progress Notes (Addendum)
Pls cosign for B12 inj.Marland KitchenJohny Chess Medical screening examination/treatment/procedure(s) were performed by non-physician practitioner and as supervising physician I was immediately available for consultation/collaboration.  I agree with above. Lew Dawes, MD

## 2020-04-10 DIAGNOSIS — D2262 Melanocytic nevi of left upper limb, including shoulder: Secondary | ICD-10-CM | POA: Diagnosis not present

## 2020-04-10 DIAGNOSIS — D485 Neoplasm of uncertain behavior of skin: Secondary | ICD-10-CM | POA: Diagnosis not present

## 2020-04-10 DIAGNOSIS — L820 Inflamed seborrheic keratosis: Secondary | ICD-10-CM | POA: Diagnosis not present

## 2020-04-10 DIAGNOSIS — L814 Other melanin hyperpigmentation: Secondary | ICD-10-CM | POA: Diagnosis not present

## 2020-04-10 DIAGNOSIS — D1801 Hemangioma of skin and subcutaneous tissue: Secondary | ICD-10-CM | POA: Diagnosis not present

## 2020-04-10 DIAGNOSIS — L570 Actinic keratosis: Secondary | ICD-10-CM | POA: Diagnosis not present

## 2020-04-10 DIAGNOSIS — D2239 Melanocytic nevi of other parts of face: Secondary | ICD-10-CM | POA: Diagnosis not present

## 2020-04-10 DIAGNOSIS — L821 Other seborrheic keratosis: Secondary | ICD-10-CM | POA: Diagnosis not present

## 2020-04-11 DIAGNOSIS — H43812 Vitreous degeneration, left eye: Secondary | ICD-10-CM | POA: Diagnosis not present

## 2020-04-11 DIAGNOSIS — H04123 Dry eye syndrome of bilateral lacrimal glands: Secondary | ICD-10-CM | POA: Diagnosis not present

## 2020-04-11 DIAGNOSIS — H26493 Other secondary cataract, bilateral: Secondary | ICD-10-CM | POA: Diagnosis not present

## 2020-04-11 DIAGNOSIS — Z961 Presence of intraocular lens: Secondary | ICD-10-CM | POA: Diagnosis not present

## 2020-04-17 ENCOUNTER — Ambulatory Visit (INDEPENDENT_AMBULATORY_CARE_PROVIDER_SITE_OTHER): Payer: Medicare PPO

## 2020-04-17 DIAGNOSIS — Z0000 Encounter for general adult medical examination without abnormal findings: Secondary | ICD-10-CM | POA: Diagnosis not present
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
## 2020-04-17 NOTE — Patient Instructions (Signed)
Alyssa Luna , Thank you for taking time to come for your Medicare Wellness Visit. I appreciate your ongoing commitment to your health goals. Please review the following plan we discussed and let me know if I can assist you in the future.   Screening recommendations/referrals: Colonoscopy: 11/21/2019; no repeat due to age Mammogram: 11/28/2014; no repeat due to age Bone Density: 09/28/2017; done every 2-3 years Recommended yearly ophthalmology/optometry visit for glaucoma screening and checkup Recommended yearly dental visit for hygiene and checkup  Vaccinations: Influenza vaccine: 04/12/2019; overdue Pneumococcal vaccine: completed Tdap vaccine: 01/17/2007; overdue; due 2018; can check with local pharmacy Shingles vaccine: completed Covid-19: completed  Advanced directives: Please bring a copy of your health care power of attorney and living will to the office at your convenience.  Conditions/risks identified: Yes. Reviewed health maintenance screenings with patient today and relevant education, vaccines, and/or referrals were provided. Continue doing brain stimulating activities (puzzles, reading, adult coloring books, staying active) to keep memory sharp. Continue to eat heart healthy diet (full of fruits, vegetables, whole grains, lean protein, water--limit salt, fat, and sugar intake) and increase physical activity as tolerated.  Next appointment: Please schedule your next Medicare Wellness Visit with your Nurse Health Advisor in 1 year.  Preventive Care 80 Years and Older, Female Preventive care refers to lifestyle choices and visits with your health care provider that can promote health and wellness. What does preventive care include?  A yearly physical exam. This is also called an annual well check.  Dental exams once or twice a year.  Routine eye exams. Ask your health care provider how often you should have your eyes checked.  Personal lifestyle choices, including:  Daily care  of your teeth and gums.  Regular physical activity.  Eating a healthy diet.  Avoiding tobacco and drug use.  Limiting alcohol use.  Practicing safe sex.  Taking low-dose aspirin every day.  Taking vitamin and mineral supplements as recommended by your health care provider. What happens during an annual well check? The services and screenings done by your health care provider during your annual well check will depend on your age, overall health, lifestyle risk factors, and family history of disease. Counseling  Your health care provider may ask you questions about your:  Alcohol use.  Tobacco use.  Drug use.  Emotional well-being.  Home and relationship well-being.  Sexual activity.  Eating habits.  History of falls.  Memory and ability to understand (cognition).  Work and work Statistician.  Reproductive health. Screening  You may have the following tests or measurements:  Height, weight, and BMI.  Blood pressure.  Lipid and cholesterol levels. These may be checked every 5 years, or more frequently if you are over 69 years old.  Skin check.  Lung cancer screening. You may have this screening every year starting at age 83 if you have a 30-pack-year history of smoking and currently smoke or have quit within the past 15 years.  Fecal occult blood test (FOBT) of the stool. You may have this test every year starting at age 42.  Flexible sigmoidoscopy or colonoscopy. You may have a sigmoidoscopy every 5 years or a colonoscopy every 10 years starting at age 13.  Hepatitis C blood test.  Hepatitis B blood test.  Sexually transmitted disease (STD) testing.  Diabetes screening. This is done by checking your blood sugar (glucose) after you have not eaten for a while (fasting). You may have this done every 1-3 years.  Bone density scan. This is  done to screen for osteoporosis. You may have this done starting at age 11.  Mammogram. This may be done every 1-2  years. Talk to your health care provider about how often you should have regular mammograms. Talk with your health care provider about your test results, treatment options, and if necessary, the need for more tests. Vaccines  Your health care provider may recommend certain vaccines, such as:  Influenza vaccine. This is recommended every year.  Tetanus, diphtheria, and acellular pertussis (Tdap, Td) vaccine. You may need a Td booster every 10 years.  Zoster vaccine. You may need this after age 65.  Pneumococcal 13-valent conjugate (PCV13) vaccine. One dose is recommended after age 45.  Pneumococcal polysaccharide (PPSV23) vaccine. One dose is recommended after age 70. Talk to your health care provider about which screenings and vaccines you need and how often you need them. This information is not intended to replace advice given to you by your health care provider. Make sure you discuss any questions you have with your health care provider. Document Released: 08/30/2015 Document Revised: 04/22/2016 Document Reviewed: 06/04/2015 Elsevier Interactive Patient Education  2017 [employer] Prevention in the Home Falls can cause injuries. They can happen to people of all ages. There are many things you can do to make your home safe and to help prevent falls. What can I do on the outside of my home?  Regularly fix the edges of walkways and driveways and fix any cracks.  Remove anything that might make you trip as you walk through a door, such as a raised step or threshold.  Trim any bushes or trees on the path to your home.  Use bright outdoor lighting.  Clear any walking paths of anything that might make someone trip, such as rocks or tools.  Regularly check to see if handrails are loose or broken. Make sure that both sides of any steps have handrails.  Any raised decks and porches should have guardrails on the edges.  Have any leaves, snow, or ice cleared regularly.  Use  sand or salt on walking paths during winter.  Clean up any spills in your garage right away. This includes oil or grease spills. What can I do in the bathroom?  Use night lights.  Install grab bars by the toilet and in the tub and shower. Do not use towel bars as grab bars.  Use non-skid mats or decals in the tub or shower.  If you need to sit down in the shower, use a plastic, non-slip stool.  Keep the floor dry. Clean up any water that spills on the floor as soon as it happens.  Remove soap buildup in the tub or shower regularly.  Attach bath mats securely with double-sided non-slip rug tape.  Do not have throw rugs and other things on the floor that can make you trip. What can I do in the bedroom?  Use night lights.  Make sure that you have a light by your bed that is easy to reach.  Do not use any sheets or blankets that are too big for your bed. They should not hang down onto the floor.  Have a firm chair that has side arms. You can use this for support while you get dressed.  Do not have throw rugs and other things on the floor that can make you trip. What can I do in the kitchen?  Clean up any spills right away.  Avoid walking on wet floors.  Keep  items that you use a lot in easy-to-reach places.  If you need to reach something above you, use a strong step stool that has a grab bar.  Keep electrical cords out of the way.  Do not use floor polish or wax that makes floors slippery. If you must use wax, use non-skid floor wax.  Do not have throw rugs and other things on the floor that can make you trip. What can I do with my stairs?  Do not leave any items on the stairs.  Make sure that there are handrails on both sides of the stairs and use them. Fix handrails that are broken or loose. Make sure that handrails are as long as the stairways.  Check any carpeting to make sure that it is firmly attached to the stairs. Fix any carpet that is loose or worn.  Avoid  having throw rugs at the top or bottom of the stairs. If you do have throw rugs, attach them to the floor with carpet tape.  Make sure that you have a light switch at the top of the stairs and the bottom of the stairs. If you do not have them, ask someone to add them for you. What else can I do to help prevent falls?  Wear shoes that:  Do not have high heels.  Have rubber bottoms.  Are comfortable and fit you well.  Are closed at the toe. Do not wear sandals.  If you use a stepladder:  Make sure that it is fully opened. Do not climb a closed stepladder.  Make sure that both sides of the stepladder are locked into place.  Ask someone to hold it for you, if possible.  Clearly mark and make sure that you can see:  Any grab bars or handrails.  First and last steps.  Where the edge of each step is.  Use tools that help you move around (mobility aids) if they are needed. These include:  Canes.  Walkers.  Scooters.  Crutches.  Turn on the lights when you go into a dark area. Replace any light bulbs as soon as they burn out.  Set up your furniture so you have a clear path. Avoid moving your furniture around.  If any of your floors are uneven, fix them.  If there are any pets around you, be aware of where they are.  Review your medicines with your doctor. Some medicines can make you feel dizzy. This can increase your chance of falling. Ask your doctor what other things that you can do to help prevent falls. This information is not intended to replace advice given to you by your health care provider. Make sure you discuss any questions you have with your health care provider. Document Released: 05/30/2009 Document Revised: 01/09/2016 Document Reviewed: 09/07/2014 Elsevier Interactive Patient Education  2017 [employer].

## 2020-04-23 ENCOUNTER — Ambulatory Visit: Payer: Medicare PPO | Admitting: Internal Medicine

## 2020-04-23 ENCOUNTER — Other Ambulatory Visit: Payer: Self-pay

## 2020-04-23 ENCOUNTER — Encounter: Payer: Self-pay | Admitting: Internal Medicine

## 2020-04-23 VITALS — BP 168/84 | HR 56 | Temp 98.4°F | Ht 62.0 in | Wt 122.0 lb

## 2020-04-23 DIAGNOSIS — E538 Deficiency of other specified B group vitamins: Secondary | ICD-10-CM | POA: Diagnosis not present

## 2020-04-23 DIAGNOSIS — E559 Vitamin D deficiency, unspecified: Secondary | ICD-10-CM

## 2020-04-23 DIAGNOSIS — Z23 Encounter for immunization: Secondary | ICD-10-CM | POA: Diagnosis not present

## 2020-04-23 DIAGNOSIS — Z91018 Allergy to other foods: Secondary | ICD-10-CM

## 2020-04-23 MED ORDER — BUSPIRONE HCL 7.5 MG PO TABS: 7.5000 mg | ORAL_TABLET | Freq: Two times a day (BID) | ORAL | 5 refills | Status: DC

## 2020-04-23 NOTE — Assessment & Plan Note (Signed)
On Vit D 

## 2020-04-23 NOTE — Progress Notes (Signed)
Subjective:  Patient ID: Alyssa Luna, female    DOB: 1934-03-03  Age: 86 y.o. MRN: [id number]  CC: No chief complaint on file.   HPI Alyssa Luna presents for GERD, IBS, "tick food allergies" f/u C/o anxiety  Outpatient Medications Prior to Visit  Medication Sig Dispense Refill  . acetaminophen (TYLENOL) 325 MG tablet Take by mouth.    [name] alum & mag hydroxide-simeth (MAALOX/MYLANTA) 200-200-20 MG/5ML suspension Take by mouth.    . cetirizine (ZYRTEC) 10 MG tablet Take 10 mg by mouth daily as needed for allergies.    . Cholecalciferol (D3 DOTS) 2000 units TBDP Take by mouth daily.    . Cyanocobalamin (VITAMIN B-12 IJ) Inject as directed every 30 (thirty) days.    [name] denosumab (PROLIA) 60 MG/ML SOLN injection Inject 60 mg into the skin every 6 (six) months. Administer in upper arm, thigh, or abdomen Unable to tolerate oral meds Dx severe osteoporosis 1.8 mL 6  . diphenhydrAMINE HCl (BENADRYL ALLERGY CHILDRENS PO) Take by mouth at bedtime as needed. Takes at night when itching    . famotidine (PEPCID) 20 MG tablet TAKE ONE TABLET BY MOUTH TWICE A DAY 180 tablet 3  . fluconazole (DIFLUCAN) 150 MG tablet     . gabapentin (NEURONTIN) 600 MG tablet TAKE 1/2 TABLET BY MOUTH 4 TIMES A DAY    . levothyroxine (SYNTHROID) 25 MCG tablet Take 1 tablet (25 mcg total) by mouth daily before breakfast. 90 tablet 3  . montelukast (SINGULAIR) 10 MG tablet TAKE 1 TABLET BY MOUTH EVERY DAY 90 tablet 1  . Rivaroxaban (XARELTO) 15 MG TABS tablet Take 15 mg by mouth daily.     . Simethicone (GAS-X PO) Take 1 tablet by mouth daily as needed (flatulence).      Facility-Administered Medications Prior to Visit  Medication Dose Route Frequency Provider Last Rate Last Admin  . cyanocobalamin ((VITAMIN B-12)) injection 1,000 mcg  1,000 mcg Intramuscular Q30 days Lynae Pederson, Evie Lacks, MD   1,000 mcg at 02/23/20 1036    ROS: Review of Systems  Constitutional: Positive for fatigue. Negative for activity  change, appetite change, chills and unexpected weight change.  HENT: Positive for congestion. Negative for mouth sores and sinus pressure.   Eyes: Negative for visual disturbance.  Respiratory: Negative for cough and chest tightness.   Gastrointestinal: Positive for abdominal distention and abdominal pain. Negative for nausea.  Genitourinary: Negative for difficulty urinating, frequency and vaginal pain.  Musculoskeletal: Positive for arthralgias, back pain and gait problem.  Skin: Negative for pallor and rash.  Neurological: Negative for dizziness, tremors, weakness, numbness and headaches.  Psychiatric/Behavioral: Negative for confusion, sleep disturbance and suicidal ideas. The patient is nervous/anxious.     Objective:  BP (!) 168/84 (BP Location: Left Arm, Patient Position: Sitting, Cuff Size: Normal)   Pulse (!) 56   Temp 98.4 F (36.9 C) (Oral)   Ht 5' 2"  (1.575 m)   Wt 122 lb (55.3 kg)   SpO2 93%   BMI 22.31 kg/m   BP Readings from Last 3 Encounters:  04/23/20 (!) 168/84  03/15/20 (!) 141/72  02/08/20 (!) 145/71    Wt Readings from Last 3 Encounters:  04/23/20 122 lb (55.3 kg)  03/15/20 125 lb (56.7 kg)  02/08/20 121 lb (54.9 kg)    Physical Exam Constitutional:      General: She is not in acute distress.    Appearance: She is well-developed.  HENT:     Head: Normocephalic.  Right Ear: External ear normal.     Left Ear: External ear normal.     Nose: Nose normal.  Eyes:     General:        Right eye: No discharge.        Left eye: No discharge.     Conjunctiva/sclera: Conjunctivae normal.     Pupils: Pupils are equal, round, and reactive to light.  Neck:     Thyroid: No thyromegaly.     Vascular: No JVD.     Trachea: No tracheal deviation.  Cardiovascular:     Rate and Rhythm: Normal rate and regular rhythm.     Heart sounds: Normal heart sounds.  Pulmonary:     Effort: No respiratory distress.     Breath sounds: No stridor. No wheezing.   Abdominal:     General: Bowel sounds are normal. There is no distension.     Palpations: Abdomen is soft. There is no mass.     Tenderness: There is no abdominal tenderness. There is no guarding or rebound.  Musculoskeletal:        General: No tenderness.     Cervical back: Normal range of motion and neck supple.  Lymphadenopathy:     Cervical: No cervical adenopathy.  Skin:    Findings: No erythema or rash.  Neurological:     Mental Status: She is oriented to person, place, and time.     Cranial Nerves: No cranial nerve deficit.     Motor: No abnormal muscle tone.     Coordination: Coordination abnormal.     Gait: Gait abnormal.     Deep Tendon Reflexes: Reflexes normal.  Psychiatric:        Behavior: Behavior normal.        Thought Content: Thought content normal.        Judgment: Judgment normal.     Lab Results  Component Value Date   WBC 4.6 12/14/2019   HGB 13.9 12/14/2019   HCT 42.3 12/14/2019   PLT 181.0 12/14/2019   GLUCOSE 91 12/14/2019   CHOL 160 02/25/2018   TRIG 119.0 02/25/2018   HDL 42.90 02/25/2018   LDLDIRECT 178.0 09/24/2011   LDLCALC 94 02/25/2018   ALT 13 12/14/2019   AST 23 12/14/2019   NA 142 12/14/2019   K 4.0 12/14/2019   CL 105 12/14/2019   CREATININE 0.72 12/14/2019   BUN 11 12/14/2019   CO2 31 12/14/2019   TSH 2.26 10/17/2019   INR 1.3 (H) 02/07/2019    No results found.  Assessment & Plan:    Walker Kehr, MD

## 2020-04-23 NOTE — Assessment & Plan Note (Signed)
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 Avoidance diet

## 2020-04-23 NOTE — Addendum Note (Signed)
Addended by: Darlys Gales on: 04/23/2020 03:37 PM   Modules accepted: Orders

## 2020-04-23 NOTE — Assessment & Plan Note (Signed)
On B12 

## 2020-05-06 ENCOUNTER — Encounter: Payer: Self-pay | Admitting: Podiatry

## 2020-05-06 ENCOUNTER — Other Ambulatory Visit: Payer: Self-pay

## 2020-05-06 ENCOUNTER — Ambulatory Visit (INDEPENDENT_AMBULATORY_CARE_PROVIDER_SITE_OTHER): Payer: Medicare PPO | Admitting: Podiatry

## 2020-05-06 DIAGNOSIS — L600 Ingrowing nail: Secondary | ICD-10-CM

## 2020-05-06 NOTE — Patient Instructions (Signed)
EPSOM SALT FOOT SOAK INSTRUCTIONS  *IF YOU HAVE BEEN PRESCRIBED ANTIBIOTICS, TAKE AS INSTRUCTED UNTIL ALL ARE GONE*   1.  Place 1/4 cup of epsom salts in 2 quarts of warm tap water. IF YOU ARE DIABETIC, OR HAVE NEUROPATHY, CHECK THE TEMPERATURE OF THE WATER WITH YOUR ELBOW.  2.  Submerge your foot/feet in the solution and soak for 10-15 minutes.      3.  Next, remove your foot/feet from solution, blot dry the affected area.    4.  Apply light amount of antibiotic cream/ointment and cover with fabric band-aid .  5.  This soak should be done once a day for 7 days.   6.  Monitor for any signs/symptoms of infection such as redness, swelling, odor, drainage, increased pain, or non-healing of digit.   7.  Please do not hesitate to call the office and speak to a Nurse or Doctor if you have questions.   8.  If you experience fever, chills, nightsweats, nausea or vomiting with worsening of digit, please go to the emergency room.

## 2020-05-08 ENCOUNTER — Ambulatory Visit: Payer: Medicare PPO

## 2020-05-09 NOTE — Progress Notes (Signed)
Subjective:  Patient ID: Alyssa Luna, female    DOB: 30-Jul-1934,  MRN: [id number]  Alyssa Luna presents to clinic today for ingrown toenail to the medial and lateral borders border of L hallux and R hallux.  86 y.o. female presents with the above complaint. She states a Podiatrist cut her toenails at her facility. She relates she has pain in both great toes and they feel like they're ingrown. Denies any drainage, but pain if more evident when she wears enclosed shoe gear.  Review of Systems: Negative except as noted in the HPI. Past Medical History:  Diagnosis Date  . AF (atrial fibrillation) ([place])   . Allergy    mild   . Alpha galactosidase deficiency   . Anemia   . Anxiety    has lorazepam on hand for nervousness, pt. reports that she had a break-in to her home on 05/2014  . Atrial fibrillation ([place])    D Taylor  . Breast cancer ([place]) 1984   left   . Cataract    removed both eyes   . Colon polyps   . Coronary artery disease   . Diverticulosis   . Diverticulosis of colon   . Dysrhythmia    atrial fib  . Esophageal dysmotility   . Familial tremor   . GERD (gastroesophageal reflux disease)   . Hemorrhoids   . Hiatal hernia   . History of breast cancer   . History of hiatal hernia   . History of ischemic colitis   . Hyperlipidemia   . Hypertension   . Internal hemorrhoids   . LBP (low back pain)   . Meningioma ([place])   . Neuromuscular disorder ([place])    essential tremor  . Osteoarthritis    hands & back & knees   . Osteoporosis   . Primary localized osteoarthritis of left knee   . Renal cyst   . [facility] spotted fever   . Schatzki's ring   . Scoliosis   . Stroke [facility])    TIA per pt   . TIA (transient ischemic attack)   . TR (tricuspid regurgitation)    Mild  . Vitamin B12 deficiency   . Vitamin D deficiency    Past Surgical History:  Procedure Laterality Date  . AUGMENTATION MAMMAPLASTY    . BLEPHAROPLASTY Bilateral   . CATARACT  EXTRACTION, BILATERAL    . COLONOSCOPY    . gamma knife procedure  02/2019   to help the tremor at [facility] LUMBAR  02/07/2019  . IR KYPHO LUMBAR INC FX REDUCE BONE BX UNI/BIL CANNULATION INC/IMAGING  02/07/2019  . JOINT REPLACEMENT Right   . kytoplasy    . MASTECTOMY Bilateral 1984  . OOPHORECTOMY     BSO  . POLYPECTOMY    . TOTAL KNEE ARTHROPLASTY  Dec 2011   Right - Dr Noemi Chapel  . TOTAL KNEE ARTHROPLASTY Left 08/27/2014   Procedure: LEFT TOTAL KNEE ARTHROPLASTY;  Surgeon: Lorn Junes, MD;  Location: [facility];  Service: Orthopedics;  Laterality: Left;  . UPPER GASTROINTESTINAL ENDOSCOPY    . VAGINAL HYSTERECTOMY     LAVH BSO    Current Outpatient Medications:  .  acetaminophen (TYLENOL) 325 MG tablet, Take by mouth., Disp: , Rfl:  .  alum & mag hydroxide-simeth (MAALOX/MYLANTA) 427-062-37 MG/5ML suspension, Take by mouth., Disp: , Rfl:  .  busPIRone (BUSPAR) 7.5 MG tablet, Take 1 tablet (7.5 mg total) by mouth  2 (two) times daily., Disp: 60 tablet, Rfl: 5 .  cetirizine (ZYRTEC) 10 MG tablet, Take 10 mg by mouth daily as needed for allergies., Disp: , Rfl:  .  Cholecalciferol (D3 DOTS) 2000 units TBDP, Take by mouth daily., Disp: , Rfl:  .  Cyanocobalamin (VITAMIN B-12 IJ), Inject as directed every 30 (thirty) days., Disp: , Rfl:  .  denosumab (PROLIA) 60 MG/ML SOLN injection, Inject 60 mg into the skin every 6 (six) months. Administer in upper arm, thigh, or abdomen Unable to tolerate oral meds Dx severe osteoporosis, Disp: 1.8 mL, Rfl: 6 .  diphenhydrAMINE HCl (BENADRYL ALLERGY CHILDRENS PO), Take by mouth at bedtime as needed. Takes at night when itching, Disp: , Rfl:  .  famotidine (PEPCID) 20 MG tablet, TAKE ONE TABLET BY MOUTH TWICE A DAY, Disp: 180 tablet, Rfl: 3 .  fluconazole (DIFLUCAN) 150 MG tablet, , Disp: , Rfl:  .  gabapentin (NEURONTIN) 600 MG tablet, TAKE 1/2 TABLET BY MOUTH 4 TIMES A DAY, Disp: , Rfl:  .  levothyroxine  (SYNTHROID) 25 MCG tablet, Take 1 tablet (25 mcg total) by mouth daily before breakfast., Disp: 90 tablet, Rfl: 3 .  montelukast (SINGULAIR) 10 MG tablet, TAKE 1 TABLET BY MOUTH EVERY DAY, Disp: 90 tablet, Rfl: 1 .  Rivaroxaban (XARELTO) 15 MG TABS tablet, Take 15 mg by mouth daily. , Disp: , Rfl:  .  Simethicone (GAS-X PO), Take 1 tablet by mouth daily as needed (flatulence). , Disp: , Rfl:   Current Facility-Administered Medications:  .  cyanocobalamin ((VITAMIN B-12)) injection 1,000 mcg, 1,000 mcg, Intramuscular, Q30 days, Plotnikov, Evie Lacks, MD, 1,000 mcg at 02/23/20 1036 Allergies  Allergen Reactions  . Alpha-Gal Itching  . Beef (Bovine) Protein Other (See Comments)    Alpha-gal allergy testing positive June 2018  . Galactose Nausea Only    Per patient, cannot have alpha-gal since tic bite episode.  . Pravastatin Anaphylaxis and Other (See Comments)    Legs ache  . Amlodipine     Leg swelling  . Cefdinir     Nausea from suspension  . Colchicine Other (See Comments)  . Doxycycline Other (See Comments)    Nausea - able to take w/food  . Shellfish Allergy Other (See Comments)    Unknown  . Tikosyn [Dofetilide] Other (See Comments)    Unknown reaction and was told never to take it due to problems during sleep   . Actonel [Risedronate Sodium] Other (See Comments)    Not known  . Alendronate Other (See Comments)    NOT KNOWN  . Augmentin [Amoxicillin-Pot Clavulanate] Rash    Has patient had a PCN reaction causing immediate rash, facial/tongue/throat swelling, SOB or lightheadedness with hypotension: Yes Has patient had a PCN reaction causing severe rash involving mucus membranes or skin necrosis:No Has patient had a PCN reaction that required hospitalization: No Has patient had a PCN reaction occurring within the last 10 years: No If all of the above answers are "NO", then may proceed with Cephalosporin use.   . Ciprofloxacin Nausea Only  . Evista [Raloxifene] Other (See  Comments)    Unknown reaction  . Flagyl [Metronidazole] Other (See Comments)    Not known  . Fosamax [Alendronate Sodium] Other (See Comments)    NOT KNOWN  . Gold-Containing Drug Products Other (See Comments)    Per Dr  . Loma Sousa [Escitalopram Oxalate] Other (See Comments)    shaky  . Risedronate Other (See Comments)    Unknown reaction  .  Simvastatin Other (See Comments)    REACTION: leg cramps, weakness  . Tramadol Other (See Comments)    Mental disturbance changes   Social History   Occupational History  . Occupation: Retired    Fish farm manager: RETIRED  . Occupation: potter  Tobacco Use  . Smoking status: Never Smoker  . Smokeless tobacco: Never Used  Vaping Use  . Vaping Use: Never used  Substance and Sexual Activity  . Alcohol use: No    Alcohol/week: 0.0 standard drinks  . Drug use: No  . Sexual activity: Never    Birth control/protection: Surgical, Post-menopausal    Comment: HYST    Objective:   Constitutional Alyssa Luna is a pleasant 86 y.o. Caucasian female, WD, WN in NAD.[name] AAO x 3.   Vascular Capillary refill time to digits immediate b/l. Palpable pedal pulses b/l LE. Pedal hair sparse. Lower extremity skin temperature gradient within normal limits.  No cyanosis or clubbing noted.  Neurologic Normal speech. Oriented to person, place, and time. Epicritic sensation to light touch grossly present bilaterally. Protective sensation intact 5/5 intact bilaterally with 10g monofilament b/l. Vibratory sensation intact b/l.  Dermatologic Pedal skin with normal turgor, texture and tone bilaterally. No open wounds bilaterally. No interdigital macerations bilaterally. Toenails 1-5 b/l well maintained with adequate length. No erythema, no edema, no drainage, no flocculence. Incurvated nailplate medial and lateral border(s) L hallux and R hallux.  Nail border hypertrophy minimal. There is tenderness to palpation. Sign(s) of infection: no clinical signs of infection noted on  examination today..  Orthopedic: Normal muscle strength 5/5 to all lower extremity muscle groups bilaterally. Hammertoes noted to the R 5th toe.   Radiographs: None Assessment:   1. Ingrown toenail without infection    Plan:  Patient was evaluated and treated and all questions answered. -Offending nail border debrided and curretaged L hallux and R hallux utilizing sterile nail nipper and currette. Border(s) cleansed with alcohol and triple antibiotic ointment applied. Dispensed written instructions for once daily epsom salt soaks for 7 days. -Patient to report any pedal injuries to medical professional immediately. -Patient to continue soft, supportive shoe gear daily. -Patient/POA to call should there be question/concern in the interim.  Return in about 6 weeks (around 06/17/2020).  Marzetta Board, DPM

## 2020-05-15 ENCOUNTER — Telehealth: Payer: Self-pay | Admitting: Internal Medicine

## 2020-05-15 NOTE — Telephone Encounter (Signed)
Patient called and she has some nasal drainage and thinks she has a sinus infection and was wondering if Dr. Alain Marion could call her an antibiotic in. Kristopher Oppenheim at [facility], [address]    Please call pt back: [phone]

## 2020-05-17 ENCOUNTER — Other Ambulatory Visit: Payer: Self-pay

## 2020-05-17 ENCOUNTER — Ambulatory Visit (INDEPENDENT_AMBULATORY_CARE_PROVIDER_SITE_OTHER): Payer: Medicare PPO

## 2020-05-17 DIAGNOSIS — E538 Deficiency of other specified B group vitamins: Secondary | ICD-10-CM

## 2020-05-17 NOTE — Progress Notes (Signed)
Pt here for monthly B12 injection per Dr Alain Marion.  B12 1065mg given IM right deltoid and pt tolerated injection well.  Pt will schedule next b12 injection upon checkout.  Dr JJenny Reichmann Can you please cosign since dr PAlain Marionis out of office? Thank you.

## 2020-05-19 NOTE — Telephone Encounter (Signed)
Please asked the patient what antibiotics she can  tolerate.  Thanks

## 2020-05-20 ENCOUNTER — Encounter: Payer: Self-pay | Admitting: Physical Medicine and Rehabilitation

## 2020-05-20 NOTE — Progress Notes (Signed)
Alyssa Luna - 86 y.o. female MRN [id number]  Date of birth: [birth year]  Office Visit Note: Visit Date: 11/08/2019 PCP: Cassandria Anger, MD Referred by: Cassandria Anger, MD  Subjective: Chief Complaint  Patient presents with  . Lower Back - Pain   HPI: Alyssa Luna is a 86 y.o. female who comes in today For consultation and evaluation and management at request of Dr. Basil Dess for chronic worsening severe lower back pain with some referral into the hips.  Patient's history is well known and documented to Dr. Basil Dess in our prior notes.  Last injection in August was a left transforaminal injection at T12 and L1 without much relief at all.  Injections in the past have been hit or miss.  She has severe rightward scoliosis of the lumbar spine with various levels of lateral recess narrowing.  She has had osteoporosis with compression fracture as well.  Her case is complicated by multiple medical conditions and drug intolerances with 22 drug allergies listed.  She is on anticoagulation.  She really has tried and failed all manner of conservative care and has not really been deemed a very good candidate for surgical correction do to the medical history and level of scoliosis.  She had seen a presentation about spinal cord stimulator trials and we did talk at length about this today.  Her pain is a 9 out of 10 which is constant and aching but essentially 0 at rest and sitting.  Worse with bending.  Particularly worse with standing.  Has not tolerated really medical management with opioids or other medications.  She feels weak but denies focal weakness no red flag complaints.  Review of Systems  Musculoskeletal: Positive for back pain.  All other systems reviewed and are negative.  Otherwise per HPI.  Assessment & Plan: Visit Diagnoses:  1. Spinal stenosis of thoracolumbar region   2. Chronic bilateral low back pain without sciatica   3. Neuromuscular scoliosis of  thoracolumbar region   4. Bilateral stenosis of lateral recess of lumbar spine     Plan: Findings:  Chronic recalcitrant low back pain in the setting of severe thoracolumbar scoliosis and lateral recess stenosis with mainly low back pain with some referral to the hips.  She really has tried and failed all manner of conservative care and interventions.  Not really a surgical candidate.  If she did not have the medical conditions that she has and the severe scoliosis she likely would be a candidate for spinal cord stimulator trial.  I Georgina Peer have her follow-up with Dr. Basil Dess and they may wish to update MRI of the spine.  She may be a candidate for referral to [place] [facility].  I really do not have much to offer her at this point.    Meds & Orders: No orders of the defined types were placed in this encounter.  No orders of the defined types were placed in this encounter.   Follow-up: Return for Basil Dess, M.D..   Procedures: No procedures performed  No notes on file   Clinical History: MRI LUMBAR SPINE WITHOUT CONTRAST  TECHNIQUE: Multiplanar, multisequence MR imaging of the lumbar spine was performed. No intravenous contrast was administered.  COMPARISON:  CT abdomen and pelvis 01/29/2019.  FINDINGS: Segmentation:  Standard.  Alignment: Marked convex right scoliosis with the apex at L2-3 is present.  Vertebrae: The patient has a superior endplate compression fracture of L2 with vertebral body height loss  of up to approximately 30%. There is marrow edema in the vertebral body consistent with acute or early subacute injury. Superior endplate compression fractures of T10 and T11 are also identified. There is minimal marrow edema in the superior endplate of P71 consistent with late subacute to remote injury. The T11 fracture is old. The patient is status post vertebral augmentation at L1.  Conus medullaris and cauda equina: Conus extends to the L2  level. Conus and cauda equina appear normal.  Paraspinal and other soft tissues: Negative.  Disc levels:  T9-10 and T10-11 are imaged in the sagittal plane only. The central canal and foramina appear open at both levels.  T11-12: Shallow disc bulge without stenosis.  T12-L1: A left paracentral protrusion is present with some cephalad extension. There is some bony retropulsion off the superior endplate of L1. The central canal the patient's protrusion indents the left side of the thecal sac mildly deflecting the cord in conjunction with bony retropulsion. The foramina are open.  L1-2: Shallow left paracentral protrusion without stenosis. No notable retropulsion off the superior endplate of L2.  L3-4: Shallow disc bulge to the left causes mild narrowing in the left subarticular recess and foramen. The right foramen is open.  L3-4: There is a shallow disc bulge and some facet degenerative disease. There is mild narrowing in the left subarticular recess. The central canal and right foramen are open.  L4-5: Small right foraminal protrusion and endplate spur are superimposed on a shallow bulge. There is moderate right foraminal narrowing. The central canal and left foramen are open.  L5-S1: Right worse than left facet arthropathy and a shallow disc bulge. No stenosis.  IMPRESSION: Acute or early subacute superior endplate compression fracture of L2 with vertebral body height loss of approximately 30%. There is minimal retropulsion off the superior endplate of L2. No involvement of the posterior elements. The appearance of the fractures consistent with a senile osteoporotic injury.  Late subacute to remote superior endplate compression fracture of T10.  Remote compression fractures of T11 and L1.  Severe convex right scoliosis.  Left paracentral protrusion at T12-L1 mildly deflects the cord at T12-L1 in conjunction with retropulsion off the superior endplate  of L1.  Mild narrowing in the left subarticular recess at L3-4 due to a shallow bulge to the left.  Mild left subarticular recess narrowing L3-4 due to a disc bulge.  Moderate right foraminal narrowing L4-5.   Electronically Signed   By: Inge Rise M.D.   On: 01/31/2019 10:35   She reports that she has never smoked. She has never used smokeless tobacco. No results for input(s): HGBA1C, LABURIC in the last 8760 hours.  Objective:  VS:  HT:5' 2"  (157.5 cm)   WT:127 lb (57.6 kg)  BMI:23.22    BP:110/75  HR:(!) 57bpm  TEMP: ( )  RESP:  Physical Exam Vitals and nursing note reviewed.  Constitutional:      General: She is not in acute distress.    Appearance: Normal appearance. She is well-developed. She is not ill-appearing.  HENT:     Head: Normocephalic and atraumatic.  Eyes:     Conjunctiva/sclera: Conjunctivae normal.     Pupils: Pupils are equal, round, and reactive to light.  Cardiovascular:     Rate and Rhythm: Normal rate.     Pulses: Normal pulses.  Pulmonary:     Effort: Pulmonary effort is normal.  Musculoskeletal:        General: Tenderness present.     Right lower  leg: Edema present.     Left lower leg: Edema present.     Comments: Patient very slow to rise from a seated position.  She has increased thoracic kyphosis as well as obvious rightward scoliosis quite severe.  She does have focal taut bands in the paraspinal musculature.  No pain with hip rotation she has good distal strength.  Skin:    General: Skin is warm and dry.     Findings: No erythema or rash.  Neurological:     General: No focal deficit present.     Mental Status: She is alert and oriented to person, place, and time.     Sensory: No sensory deficit.     Motor: No abnormal muscle tone.     Coordination: Coordination normal.     Gait: Gait normal.  Psychiatric:        Mood and Affect: Mood normal.        Behavior: Behavior normal.     Ortho Exam  Imaging: No results  found.  Past Medical/Family/Surgical/Social History: Medications & Allergies reviewed per EMR, new medications updated. Patient Active Problem List   Diagnosis Date Noted  . Acute sinusitis 12/14/2019  . Acquired hypothyroidism 07/20/2019  . History of atrial fibrillation 07/20/2019  . RBBB 07/20/2019  . Anemia 07/19/2019  . Hoarseness of voice 06/21/2019  . Anxiety disorder 04/12/2019  . Chronic venous insufficiency 03/25/2019  . Dark stools 02/24/2019  . Meningioma, cerebral ([place]) 02/16/2019  . Grief reaction 10/18/2018  . Dysfunction of left eustachian tube 07/07/2018  . Ear pain, left 06/13/2018  . Colitis, acute 03/14/2018  . Constipation 03/14/2018  . Allergy 04/28/2017  . Dysphonia 03/17/2017  . Arthralgia 02/26/2017  . Dyspnea 01/27/2017  . Tick bite 01/27/2017  . Food allergy 01/27/2017  . Ingrown toenail 12/08/2016  . Acute upper respiratory infection 07/13/2016  . [facility] spotted fever) 04/29/2016  . 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 Onychomycosis 02/04/2016  . Osteoporosis, post-menopausal 12/15/2015  . Lumbar radiculopathy 08/14/2015  . Lumbar scoliosis 08/14/2015  . Long term current use of anticoagulant therapy 07/18/2015  . Dysuria 06/16/2015  . Allergic rhinitis 06/13/2015  . Closed wedge compression fracture of fourth lumbar vertebra ([place])   . Thrush, oral 01/04/2015  . Itching 01/04/2015  . Hypokalemia 11/06/2014  . Fatigue 10/19/2014  . LLQ abdominal pain 10/02/2014  . Swelling of left knee joint 10/02/2014  . Nausea without vomiting 10/02/2014  . DJD (degenerative joint disease) of knee 08/27/2014  . Primary localized osteoarthritis of left knee   . Breast cancer ([place])   . GERD (gastroesophageal reflux disease)   . Preop exam for internal medicine 06/13/2014  . Anxiety state 06/13/2014  . Bradycardia 11/09/2013  . Essential hypertension 11/09/2013  . Encounter for therapeutic drug monitoring 09/08/2013  . Well adult exam  05/21/2013  . Preop cardiovascular exam 05/08/2013  . Tremor, essential 12/14/2012  . Right hip pain 05/10/2012  . Vertigo 03/09/2012  . Dyslipidemia 03/31/2011  . Meningioma ([place]) 02/03/2011  . TIA (transient ischemic attack) 11/13/2010  . Abnormal CT of brain 11/13/2010  . CAROTID BRUIT 07/28/2010  . Edema 05/02/2010  . Atrial fibrillation ([place]) 04/08/2010  . SYNCOPE 03/31/2010  . LOW BACK PAIN 06/05/2009  . Chronic maxillary sinusitis 01/31/2009  . EFFUSION OF JOINT OTHER SPECIFIED SITE 01/31/2009  . Diarrhea 05/31/2008  . ABNORMAL CHEST XRAY 05/15/2008  . HEMATURIA, MICROSCOPIC, HX OF 08/04/2007  . B12 deficiency 03/21/2007  . Vitamin D deficiency 03/21/2007  .  Disease of tricuspid valve 03/21/2007  . DIVERTICULOSIS, COLON 03/21/2007  . Osteoarthritis 03/21/2007  . Osteoporosis 03/21/2007  . Personal history of malignant neoplasm of breast 03/21/2007  . COLONIC POLYPS, HX OF 03/21/2007   Past Medical History:  Diagnosis Date  . AF (atrial fibrillation) ([place])   . Allergy    mild   . Alpha galactosidase deficiency   . Anemia   . Anxiety    has lorazepam on hand for nervousness, pt. reports that she had a break-in to her home on 05/2014  . Atrial fibrillation ([place])    D Taylor  . Breast cancer ([place]) 1984   left   . Cataract    removed both eyes   . Colon polyps   . Coronary artery disease   . Diverticulosis   . Diverticulosis of colon   . Dysrhythmia    atrial fib  . Esophageal dysmotility   . Familial tremor   . GERD (gastroesophageal reflux disease)   . Hemorrhoids   . Hiatal hernia   . History of breast cancer   . History of hiatal hernia   . History of ischemic colitis   . Hyperlipidemia   . Hypertension   . Internal hemorrhoids   . LBP (low back pain)   . Meningioma ([place])   . Neuromuscular disorder ([place])    essential tremor  . Osteoarthritis    hands & back & knees   . Osteoporosis   . Primary localized osteoarthritis of left knee   . Renal  cyst   . [facility] spotted fever   . Schatzki's ring   . Scoliosis   . Stroke [facility])    TIA per pt   . TIA (transient ischemic attack)   . TR (tricuspid regurgitation)    Mild  . Vitamin B12 deficiency   . Vitamin D deficiency    Family History  Problem Relation Age of Onset  . Breast cancer Mother 64  . Tremor Mother   . Tremor Brother   . Colon cancer Maternal Aunt   . Ovarian cancer Maternal Grandmother   . Early death Neg Hx   . Stroke Neg Hx   . Esophageal cancer Neg Hx   . Stomach cancer Neg Hx   . Pancreatic cancer Neg Hx   . Liver disease Neg Hx   . Inflammatory bowel disease Neg Hx   . Colon polyps Neg Hx   . Rectal cancer Neg Hx    Past Surgical History:  Procedure Laterality Date  . AUGMENTATION MAMMAPLASTY    . BLEPHAROPLASTY Bilateral   . CATARACT EXTRACTION, BILATERAL    . COLONOSCOPY    . gamma knife procedure  02/2019   to help the tremor at [facility] LUMBAR  02/07/2019  . IR KYPHO LUMBAR INC FX REDUCE BONE BX UNI/BIL CANNULATION INC/IMAGING  02/07/2019  . JOINT REPLACEMENT Right   . kytoplasy    . MASTECTOMY Bilateral 1984  . OOPHORECTOMY     BSO  . POLYPECTOMY    . TOTAL KNEE ARTHROPLASTY  Dec 2011   Right - Dr Noemi Chapel  . TOTAL KNEE ARTHROPLASTY Left 08/27/2014   Procedure: LEFT TOTAL KNEE ARTHROPLASTY;  Surgeon: Lorn Junes, MD;  Location: [facility];  Service: Orthopedics;  Laterality: Left;  . UPPER GASTROINTESTINAL ENDOSCOPY    . VAGINAL HYSTERECTOMY     LAVH BSO   Social History   Occupational History  . Occupation: Retired  Employer: RETIRED  . Occupation: potter  Tobacco Use  . Smoking status: Never Smoker  . Smokeless tobacco: Never Used  Vaping Use  . Vaping Use: Never used  Substance and Sexual Activity  . Alcohol use: No    Alcohol/week: 0.0 standard drinks  . Drug use: No  . Sexual activity: Never    Birth control/protection: Surgical, Post-menopausal    Comment: HYST

## 2020-05-20 NOTE — Telephone Encounter (Signed)
No capsules  Cefuroxime  Axetil  250 mg tablets  Kristopher Oppenheim at [facility], [address] Phone:  [phone]  Fax:  [phone]

## 2020-05-21 MED ORDER — CEFUROXIME AXETIL 250 MG PO TABS: 250.0000 mg | ORAL_TABLET | Freq: Two times a day (BID) | ORAL | 0 refills | Status: DC

## 2020-05-21 NOTE — Telephone Encounter (Signed)
OK. Done Thx

## 2020-05-23 NOTE — Progress Notes (Signed)
Patient ID: Alyssa Luna, female   DOB: [birth year], 86 y.o.   MRN: [id number]  Medical screening examination/treatment/procedure(s) were performed by non-physician practitioner and as supervising physician I was immediately available for consultation/collaboration. I agree with above. Cathlean Cower, MD

## 2020-06-02 DIAGNOSIS — Z91018 Allergy to other foods: Secondary | ICD-10-CM | POA: Diagnosis not present

## 2020-06-04 DIAGNOSIS — Z923 Personal history of irradiation: Secondary | ICD-10-CM | POA: Diagnosis not present

## 2020-06-04 DIAGNOSIS — G250 Essential tremor: Secondary | ICD-10-CM | POA: Diagnosis not present

## 2020-06-04 DIAGNOSIS — Z9889 Other specified postprocedural states: Secondary | ICD-10-CM | POA: Diagnosis not present

## 2020-06-04 DIAGNOSIS — R251 Tremor, unspecified: Secondary | ICD-10-CM | POA: Diagnosis not present

## 2020-06-04 DIAGNOSIS — D320 Benign neoplasm of cerebral meninges: Secondary | ICD-10-CM | POA: Diagnosis not present

## 2020-06-04 DIAGNOSIS — D329 Benign neoplasm of meninges, unspecified: Secondary | ICD-10-CM | POA: Diagnosis not present

## 2020-06-10 ENCOUNTER — Encounter: Payer: Self-pay | Admitting: Specialist

## 2020-06-10 ENCOUNTER — Other Ambulatory Visit: Payer: Self-pay

## 2020-06-10 ENCOUNTER — Ambulatory Visit: Payer: Medicare PPO | Admitting: Specialist

## 2020-06-10 VITALS — BP 142/82 | HR 60 | Ht 62.0 in | Wt 122.0 lb

## 2020-06-10 DIAGNOSIS — M48062 Spinal stenosis, lumbar region with neurogenic claudication: Secondary | ICD-10-CM | POA: Diagnosis not present

## 2020-06-10 DIAGNOSIS — M1611 Unilateral primary osteoarthritis, right hip: Secondary | ICD-10-CM

## 2020-06-10 DIAGNOSIS — M4156 Other secondary scoliosis, lumbar region: Secondary | ICD-10-CM | POA: Diagnosis not present

## 2020-06-10 DIAGNOSIS — M4125 Other idiopathic scoliosis, thoracolumbar region: Secondary | ICD-10-CM

## 2020-06-10 DIAGNOSIS — M8000XA Age-related osteoporosis with current pathological fracture, unspecified site, initial encounter for fracture: Secondary | ICD-10-CM

## 2020-06-10 NOTE — Progress Notes (Signed)
Office Visit Note   Patient: Alyssa Luna           Date of Birth: 1934/06/15           MRN: [id number] Visit Date: 06/10/2020              Requested by: Cassandria Anger, MD [place],  [place] [id number] PCP: Plotnikov, Evie Lacks, MD   Assessment & Plan: Visit Diagnoses:  1. Idiopathic scoliosis of thoracolumbar region   2. Other secondary scoliosis, lumbar region   3. Age-related osteoporosis with current pathological fracture, initial encounter   4. Spinal stenosis of lumbar region with neurogenic claudication   5. Unilateral primary osteoarthritis, right hip     Plan: Avoid frequent bending and stooping  No lifting greater than 10 lbs. May use ice or moist heat for pain. Weight loss is of benefit. Best medication for lumbar disc disease is arthritis medications like motrin, celebrex and naprosyn. Exercise is important to improve your indurance and does allow people to function better inspite of back pain  Will give Rx for a lift chair to assist in standing and initiate transition to standing from sitting. .    Follow-Up Instructions: No follow-ups on file.   Orders:  No orders of the defined types were placed in this encounter.  No orders of the defined types were placed in this encounter.     Procedures: No procedures performed   Clinical Data: No additional findings.   Subjective: Chief Complaint  Patient presents with  . Spine - Follow-up    86 year old female with history of lumbar compression fracture and lumbar spondylolisthesis. She is feeling better overall and plans to drive to [place] for a trip to her son's in [place]. She is not experinencing any numbness or paresthesias. No bowel or bladder difficulty. Is experiencing difficulty arrising from sitting position.    Review of Systems  Constitutional: Negative.   HENT: Negative.   Eyes: Negative.   Respiratory: Negative.   Cardiovascular: Negative.     Gastrointestinal: Negative.   Endocrine: Negative.   Genitourinary: Negative.   Musculoskeletal: Negative.   Skin: Negative.   Allergic/Immunologic: Negative.   Neurological: Negative.   Hematological: Negative.   Psychiatric/Behavioral: Negative.      Objective: Vital Signs: BP (!) 142/82 (BP Location: Left Arm, Patient Position: Sitting)   Pulse 60   Ht 5' 2"  (1.575 m)   Wt 122 lb (55.3 kg)   BMI 22.31 kg/m   Physical Exam Constitutional:      Appearance: She is well-developed.  HENT:     Head: Normocephalic and atraumatic.  Eyes:     Pupils: Pupils are equal, round, and reactive to light.  Pulmonary:     Effort: Pulmonary effort is normal.     Breath sounds: Normal breath sounds.  Abdominal:     General: Bowel sounds are normal.     Palpations: Abdomen is soft.  Musculoskeletal:     Cervical back: Normal range of motion and neck supple.     Lumbar back: Negative right straight leg raise test and negative left straight leg raise test.  Skin:    General: Skin is warm and dry.  Neurological:     Mental Status: She is alert and oriented to person, place, and time.  Psychiatric:        Behavior: Behavior normal.        Thought Content: Thought content normal.  Judgment: Judgment normal.     Back Exam   Tenderness  The patient is experiencing tenderness in the lumbar.  Range of Motion  Extension: abnormal  Flexion: abnormal  Lateral bend right: normal  Lateral bend left: normal  Rotation right: normal  Rotation left: normal   Muscle Strength  Right Quadriceps:  5/5  Left Quadriceps:  5/5  Right Hamstrings:  5/5  Left Hamstrings:  5/5   Tests  Straight leg raise right: negative Straight leg raise left: negative  Reflexes  Patellar: 2/4 Achilles: 2/4  Other  Toe walk: normal Heel walk: normal  Comments:  Motor is intact      Specialty Comments:  No specialty comments available.  Imaging: No results found.   PMFS  History: Patient Active Problem List   Diagnosis Date Noted  . Acute sinusitis 12/14/2019  . Acquired hypothyroidism 07/20/2019  . History of atrial fibrillation 07/20/2019  . RBBB 07/20/2019  . Anemia 07/19/2019  . Hoarseness of voice 06/21/2019  . Anxiety disorder 04/12/2019  . Chronic venous insufficiency 03/25/2019  . Dark stools 02/24/2019  . Meningioma, cerebral ([place]) 02/16/2019  . Grief reaction 10/18/2018  . Dysfunction of left eustachian tube 07/07/2018  . Ear pain, left 06/13/2018  . Colitis, acute 03/14/2018  . Constipation 03/14/2018  . Allergy 04/28/2017  . Dysphonia 03/17/2017  . Arthralgia 02/26/2017  . Dyspnea 01/27/2017  . Tick bite 01/27/2017  . Food allergy 01/27/2017  . Ingrown toenail 12/08/2016  . Acute upper respiratory infection 07/13/2016  . RMSF [facility] spotted fever) 04/29/2016  . 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 Onychomycosis 02/04/2016  . Osteoporosis, post-menopausal 12/15/2015  . Lumbar radiculopathy 08/14/2015  . Lumbar scoliosis 08/14/2015  . Long term current use of anticoagulant therapy 07/18/2015  . Dysuria 06/16/2015  . Allergic rhinitis 06/13/2015  . Closed wedge compression fracture of fourth lumbar vertebra ([place])   . Thrush, oral 01/04/2015  . Itching 01/04/2015  . Hypokalemia 11/06/2014  . Fatigue 10/19/2014  . LLQ abdominal pain 10/02/2014  . Swelling of left knee joint 10/02/2014  . Nausea without vomiting 10/02/2014  . DJD (degenerative joint disease) of knee 08/27/2014  . Primary localized osteoarthritis of left knee   . Breast cancer ([place])   . GERD (gastroesophageal reflux disease)   . Preop exam for internal medicine 06/13/2014  . Anxiety state 06/13/2014  . Bradycardia 11/09/2013  . Essential hypertension 11/09/2013  . Encounter for therapeutic drug monitoring 09/08/2013  . Well adult exam 05/21/2013  . Preop cardiovascular exam 05/08/2013  . Tremor, essential 12/14/2012  . Right hip pain 05/10/2012  .  Vertigo 03/09/2012  . Dyslipidemia 03/31/2011  . Meningioma ([place]) 02/03/2011  . TIA (transient ischemic attack) 11/13/2010  . Abnormal CT of brain 11/13/2010  . CAROTID BRUIT 07/28/2010  . Edema 05/02/2010  . Atrial fibrillation ([place]) 04/08/2010  . SYNCOPE 03/31/2010  . LOW BACK PAIN 06/05/2009  . Chronic maxillary sinusitis 01/31/2009  . EFFUSION OF JOINT OTHER SPECIFIED SITE 01/31/2009  . Diarrhea 05/31/2008  . ABNORMAL CHEST XRAY 05/15/2008  . HEMATURIA, MICROSCOPIC, HX OF 08/04/2007  . B12 deficiency 03/21/2007  . Vitamin D deficiency 03/21/2007  . Disease of tricuspid valve 03/21/2007  . DIVERTICULOSIS, COLON 03/21/2007  . Osteoarthritis 03/21/2007  . Osteoporosis 03/21/2007  . Personal history of malignant neoplasm of breast 03/21/2007  . COLONIC POLYPS, HX OF 03/21/2007   Past Medical History:  Diagnosis Date  . AF (atrial fibrillation) ([place])   . Allergy    mild   .  Alpha galactosidase deficiency   . Anemia   . Anxiety    has lorazepam on hand for nervousness, pt. reports that she had a break-in to her home on 05/2014  . Atrial fibrillation ([place])    D [place]  . Breast cancer ([place]) 1984   left   . Cataract    removed both eyes   . Colon polyps   . Coronary artery disease   . Diverticulosis   . Diverticulosis of colon   . Dysrhythmia    atrial fib  . Esophageal dysmotility   . Familial tremor   . GERD (gastroesophageal reflux disease)   . Hemorrhoids   . Hiatal hernia   . History of breast cancer   . History of hiatal hernia   . History of ischemic colitis   . Hyperlipidemia   . Hypertension   . Internal hemorrhoids   . LBP (low back pain)   . Meningioma ([place])   . Neuromuscular disorder ([place])    essential tremor  . Osteoarthritis    hands & back & knees   . Osteoporosis   . Primary localized osteoarthritis of left knee   . Renal cyst   . [facility] spotted fever   . Schatzki's ring   . Scoliosis   . Stroke [facility])    TIA per pt   . TIA  (transient ischemic attack)   . TR (tricuspid regurgitation)    Mild  . Vitamin B12 deficiency   . Vitamin D deficiency     Family History  Problem Relation Age of Onset  . Breast cancer Mother 43  . Tremor Mother   . Tremor Brother   . Colon cancer Maternal Aunt   . Ovarian cancer Maternal Grandmother   . Early death Neg Hx   . Stroke Neg Hx   . Esophageal cancer Neg Hx   . Stomach cancer Neg Hx   . Pancreatic cancer Neg Hx   . Liver disease Neg Hx   . Inflammatory bowel disease Neg Hx   . Colon polyps Neg Hx   . Rectal cancer Neg Hx     Past Surgical History:  Procedure Laterality Date  . AUGMENTATION MAMMAPLASTY    . BLEPHAROPLASTY Bilateral   . CATARACT EXTRACTION, BILATERAL    . COLONOSCOPY    . gamma knife procedure  02/2019   to help the tremor at [place] LUMBAR  02/07/2019  . IR KYPHO LUMBAR INC FX REDUCE BONE BX UNI/BIL CANNULATION INC/IMAGING  02/07/2019  . JOINT REPLACEMENT Right   . kytoplasy    . MASTECTOMY Bilateral 1984  . OOPHORECTOMY     BSO  . POLYPECTOMY    . TOTAL KNEE ARTHROPLASTY  Dec 2011   Right - Dr Noemi Chapel  . TOTAL KNEE ARTHROPLASTY Left 08/27/2014   Procedure: LEFT TOTAL KNEE ARTHROPLASTY;  Surgeon: Lorn Junes, MD;  Location: [place];  Service: Orthopedics;  Laterality: Left;  . UPPER GASTROINTESTINAL ENDOSCOPY    . VAGINAL HYSTERECTOMY     LAVH BSO   Social History   Occupational History  . Occupation: Retired    Fish farm manager: RETIRED  . Occupation: potter  Tobacco Use  . Smoking status: Never Smoker  . Smokeless tobacco: Never Used  Vaping Use  . Vaping Use: Never used  Substance and Sexual Activity  . Alcohol use: No    Alcohol/week: 0.0 standard drinks  . Drug use: No  . Sexual  activity: Never    Birth control/protection: Surgical, Post-menopausal    Comment: HYST

## 2020-06-10 NOTE — Patient Instructions (Signed)
Plan: Avoid frequent bending and stooping  No lifting greater than 10 lbs. May use ice or moist heat for pain. Weight loss is of benefit. Best medication for lumbar disc disease is arthritis medications like motrin, celebrex and naprosyn. Exercise is important to improve your indurance and does allow people to function better inspite of back pain  Will give Rx for a lift chair to assist in standing and initiate transition to standing from sitting.

## 2020-06-12 ENCOUNTER — Other Ambulatory Visit: Payer: Self-pay | Admitting: Internal Medicine

## 2020-06-13 ENCOUNTER — Ambulatory Visit (INDEPENDENT_AMBULATORY_CARE_PROVIDER_SITE_OTHER): Payer: Medicare PPO

## 2020-06-13 ENCOUNTER — Other Ambulatory Visit: Payer: Self-pay

## 2020-06-13 ENCOUNTER — Ambulatory Visit: Payer: Medicare PPO

## 2020-06-13 DIAGNOSIS — E538 Deficiency of other specified B group vitamins: Secondary | ICD-10-CM

## 2020-06-14 ENCOUNTER — Ambulatory Visit: Payer: Medicare PPO | Admitting: Podiatry

## 2020-06-14 DIAGNOSIS — M79674 Pain in right toe(s): Secondary | ICD-10-CM

## 2020-06-14 DIAGNOSIS — M2041 Other hammer toe(s) (acquired), right foot: Secondary | ICD-10-CM | POA: Diagnosis not present

## 2020-06-14 NOTE — Progress Notes (Signed)
  Subjective:  Patient ID: CLEVIE PROUT, female    DOB: 1933/09/28,  MRN: [id number]  Chief Complaint  Patient presents with  . Callouses    Corn- 5th right digit- pt states it is causing pain and discomfort- further evlauatin    86 y.o. female presents with the above complaint. History confirmed with patient.  Objective:  Physical Exam: warm, good capillary refill, no trophic changes or ulcerative lesions, normal DP and PT pulses and normal sensory exam. Right Foot: rigid hammertoes right foot with 5th PIPJ corn   Assessment:   1. Hammer toe of right foot   2. Pain of toe of right foot      Plan:  Patient was evaluated and treated and all questions answered.  Right 5th HT with corn -Educated on etiology -Discussed padding and shoe gear -Minimally debrided today. Non-procedural.  No follow-ups on file.

## 2020-06-19 ENCOUNTER — Ambulatory Visit: Payer: Medicare PPO | Admitting: Specialist

## 2020-06-24 ENCOUNTER — Other Ambulatory Visit: Payer: Self-pay

## 2020-06-24 ENCOUNTER — Encounter: Payer: Self-pay | Admitting: Podiatry

## 2020-06-24 ENCOUNTER — Ambulatory Visit: Payer: Medicare PPO | Admitting: Podiatry

## 2020-06-24 DIAGNOSIS — M79609 Pain in unspecified limb: Secondary | ICD-10-CM

## 2020-06-24 DIAGNOSIS — L84 Corns and callosities: Secondary | ICD-10-CM

## 2020-06-24 DIAGNOSIS — Z9229 Personal history of other drug therapy: Secondary | ICD-10-CM

## 2020-06-24 DIAGNOSIS — B351 Tinea unguium: Secondary | ICD-10-CM

## 2020-06-24 NOTE — Progress Notes (Signed)
Medical screening examination/treatment/procedure(s) were performed by non-physician practitioner and as supervising physician I was immediately available for consultation/collaboration. I agree with above. Lew Dawes, MD

## 2020-06-29 NOTE — Progress Notes (Signed)
Subjective: Alyssa Luna is a 86 y.o. female patient seen today long term blood thinner, Xarelto, and presents today with painful, discolored, thick toenails which interfere with daily activities. Patient also has painful corn right 5th digit secondary to hammertoe deformity.Patient states her right 5th digit is tender today.  She states she attended the [facility] this morning and enjoyed meeting many Pastors there today.  Past Medical History:  Diagnosis Date  . AF (atrial fibrillation) ([place])   . Allergy    mild   . Alpha galactosidase deficiency   . Anemia   . Anxiety    has lorazepam on hand for nervousness, pt. reports that she had a break-in to her home on 05/2014  . Atrial fibrillation ([place])    D [place]  . Breast cancer ([place]) 1984   left   . Cataract    removed both eyes   . Colon polyps   . Coronary artery disease   . Diverticulosis   . Diverticulosis of colon   . Dysrhythmia    atrial fib  . Esophageal dysmotility   . Familial tremor   . GERD (gastroesophageal reflux disease)   . Hemorrhoids   . Hiatal hernia   . History of breast cancer   . History of hiatal hernia   . History of ischemic colitis   . Hyperlipidemia   . Hypertension   . Internal hemorrhoids   . LBP (low back pain)   . Meningioma ([place])   . Neuromuscular disorder ([place])    essential tremor  . Osteoarthritis    hands & back & knees   . Osteoporosis   . Primary localized osteoarthritis of left knee   . Renal cyst   . [facility] spotted fever   . Schatzki's ring   . Scoliosis   . Stroke [facility])    TIA per pt   . TIA (transient ischemic attack)   . TR (tricuspid regurgitation)    Mild  . Vitamin B12 deficiency   . Vitamin D deficiency     Patient Active Problem List   Diagnosis Date Noted  . Acute sinusitis 12/14/2019  . Acquired hypothyroidism 07/20/2019  . History of atrial fibrillation 07/20/2019  . RBBB 07/20/2019  . Anemia 07/19/2019  . Hoarseness of voice  06/21/2019  . Anxiety disorder 04/12/2019  . Chronic venous insufficiency 03/25/2019  . Dark stools 02/24/2019  . Meningioma, cerebral ([place]) 02/16/2019  . Grief reaction 10/18/2018  . Dysfunction of left eustachian tube 07/07/2018  . Ear pain, left 06/13/2018  . Colitis, acute 03/14/2018  . Constipation 03/14/2018  . Allergy 04/28/2017  . Dysphonia 03/17/2017  . Arthralgia 02/26/2017  . Dyspnea 01/27/2017  . Tick bite 01/27/2017  . Food allergy 01/27/2017  . Ingrown toenail 12/08/2016  . Acute upper respiratory infection 07/13/2016  . RMSF [facility] spotted fever) 04/29/2016  . 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 Onychomycosis 02/04/2016  . Osteoporosis, post-menopausal 12/15/2015  . Lumbar radiculopathy 08/14/2015  . Lumbar scoliosis 08/14/2015  . Long term current use of anticoagulant therapy 07/18/2015  . Dysuria 06/16/2015  . Allergic rhinitis 06/13/2015  . Closed wedge compression fracture of fourth lumbar vertebra ([place])   . Thrush, oral 01/04/2015  . Itching 01/04/2015  . Hypokalemia 11/06/2014  . Fatigue 10/19/2014  . LLQ abdominal pain 10/02/2014  . Swelling of left knee joint 10/02/2014  . Nausea without vomiting 10/02/2014  . DJD (degenerative joint disease) of knee 08/27/2014  . Primary localized osteoarthritis of left knee   .  Breast cancer ([place])   . GERD (gastroesophageal reflux disease)   . Preop exam for internal medicine 06/13/2014  . Anxiety state 06/13/2014  . Bradycardia 11/09/2013  . Essential hypertension 11/09/2013  . Encounter for therapeutic drug monitoring 09/08/2013  . Well adult exam 05/21/2013  . Preop cardiovascular exam 05/08/2013  . Tremor, essential 12/14/2012  . Right hip pain 05/10/2012  . Vertigo 03/09/2012  . Dyslipidemia 03/31/2011  . Meningioma ([place]) 02/03/2011  . TIA (transient ischemic attack) 11/13/2010  . Abnormal CT of brain 11/13/2010  . CAROTID BRUIT 07/28/2010  . Edema 05/02/2010  . Atrial fibrillation ([place])  04/08/2010  . SYNCOPE 03/31/2010  . LOW BACK PAIN 06/05/2009  . Chronic maxillary sinusitis 01/31/2009  . EFFUSION OF JOINT OTHER SPECIFIED SITE 01/31/2009  . Diarrhea 05/31/2008  . ABNORMAL CHEST XRAY 05/15/2008  . HEMATURIA, MICROSCOPIC, HX OF 08/04/2007  . B12 deficiency 03/21/2007  . Vitamin D deficiency 03/21/2007  . Disease of tricuspid valve 03/21/2007  . DIVERTICULOSIS, COLON 03/21/2007  . Osteoarthritis 03/21/2007  . Osteoporosis 03/21/2007  . Personal history of malignant neoplasm of breast 03/21/2007  . COLONIC POLYPS, HX OF 03/21/2007    Current Outpatient Medications on File Prior to Visit  Medication Sig Dispense Refill  . acetaminophen (TYLENOL) 325 MG tablet Take by mouth.    [name] alum & mag hydroxide-simeth (MAALOX/MYLANTA) 200-200-20 MG/5ML suspension Take by mouth.    . B Complex-C (B-COMPLEX WITH VITAMIN C) tablet Take by mouth.    . busPIRone (BUSPAR) 7.5 MG tablet Take 1 tablet (7.5 mg total) by mouth 2 (two) times daily. 60 tablet 5  . cefUROXime (CEFTIN) 250 MG tablet Take 1 tablet (250 mg total) by mouth 2 (two) times daily with a meal. 20 tablet 0  . cetirizine (ZYRTEC) 10 MG tablet Take 10 mg by mouth daily as needed for allergies.    . Cholecalciferol (D3 DOTS) 2000 units TBDP Take by mouth daily.    . Cyanocobalamin (VITAMIN B-12 IJ) Inject as directed every 30 (thirty) days.    [name] denosumab (PROLIA) 60 MG/ML SOLN injection Inject 60 mg into the skin every 6 (six) months. Administer in upper arm, thigh, or abdomen Unable to tolerate oral meds Dx severe osteoporosis 1.8 mL 6  . diphenhydrAMINE (SOMINEX) 25 MG tablet Take by mouth.    . diphenhydrAMINE HCl (BENADRYL ALLERGY CHILDRENS PO) Take by mouth at bedtime as needed. Takes at night when itching    . famotidine (PEPCID) 20 MG tablet TAKE ONE TABLET BY MOUTH TWICE A DAY 180 tablet 3  . fluconazole (DIFLUCAN) 150 MG tablet     . gabapentin (NEURONTIN) 600 MG tablet TAKE 1/2 TABLET BY MOUTH 4 TIMES A DAY     . levothyroxine (SYNTHROID) 25 MCG tablet Take 1 tablet (25 mcg total) by mouth daily before breakfast. 90 tablet 3  . montelukast (SINGULAIR) 10 MG tablet TAKE ONE TABLET BY MOUTH DAILY 90 tablet 1  . Rivaroxaban (XARELTO) 15 MG TABS tablet Take 15 mg by mouth daily.     . Simethicone (GAS-X PO) Take 1 tablet by mouth daily as needed (flatulence).      Current Facility-Administered Medications on File Prior to Visit  Medication Dose Route Frequency Provider Last Rate Last Admin  . cyanocobalamin ((VITAMIN B-12)) injection 1,000 mcg  1,000 mcg Intramuscular Q30 days Plotnikov, Evie Lacks, MD   1,000 mcg at 06/13/20 1544    Allergies  Allergen Reactions  . Alpha-Gal Itching  . Beef (Bovine) Protein Other (See  Comments)    Alpha-gal allergy testing positive June 2018  . Galactose Nausea Only    Per patient, cannot have alpha-gal since tic bite episode.  . Pravastatin Anaphylaxis and Other (See Comments)    Legs ache  . Alpha-D-Galactosidase Itching  . Amlodipine     Leg swelling  . Cefdinir     Nausea from suspension  . Colchicine Other (See Comments)  . Doxycycline Other (See Comments)    Nausea - able to take w/food  . Shellfish Allergy Other (See Comments)    Unknown  . Tikosyn [Dofetilide] Other (See Comments)    Unknown reaction and was told never to take it due to problems during sleep   . Actonel [Risedronate Sodium] Other (See Comments)    Not known  . Alendronate Other (See Comments)    NOT KNOWN  . Augmentin [Amoxicillin-Pot Clavulanate] Rash    Has patient had a PCN reaction causing immediate rash, facial/tongue/throat swelling, SOB or lightheadedness with hypotension: Yes Has patient had a PCN reaction causing severe rash involving mucus membranes or skin necrosis:No Has patient had a PCN reaction that required hospitalization: No Has patient had a PCN reaction occurring within the last 10 years: No If all of the above answers are "NO", then may proceed with  Cephalosporin use.   . Cheese Rash  . Ciprofloxacin Nausea Only  . Evista [Raloxifene] Other (See Comments)    Unknown reaction  . Flagyl [Metronidazole] Other (See Comments)    Not known  . Fosamax [Alendronate Sodium] Other (See Comments)    NOT KNOWN  . Gold-Containing Drug Products Other (See Comments)    Per Dr  . Loma Sousa [Escitalopram Oxalate] Other (See Comments)    shaky  . Other Rash  . Risedronate Other (See Comments)    Unknown reaction  . Simvastatin Other (See Comments)    REACTION: leg cramps, weakness  . Tramadol Other (See Comments)    Mental disturbance changes    Objective: Physical Exam  General: Patient is a pleasant 86 y.o. Caucasian female WD, WN in NAD. AAO x 3.   Neurovascular Examination: Capillary refill time to digits immediate b/l. Palpable pedal pulses b/l LE. Pedal hair sparse. Lower extremity skin temperature gradient within normal limits.   Protective sensation intact 5/5 intact bilaterally with 10g monofilament b/l. Vibratory sensation intact b/l. Proprioception intact bilaterally.  Dermatological:  Pedal skin with normal turgor, texture and tone bilaterally. No open wounds bilaterally. No interdigital macerations bilaterally. Toenails 1-5 b/l elongated, discolored, dystrophic, thickened, crumbly with subungual debris and tenderness to dorsal palpation. Hyperkeratotic lesion(s) R 5th toe.  No erythema, no edema, no drainage, no flocculence.  Musculoskeletal:  Normal muscle strength 5/5 to all lower extremity muscle groups bilaterally. No pain crepitus or joint limitation noted with ROM b/l. Hammertoes noted to the R 5th toe.  Assessment and Plan:  1. Pain due to onychomycosis of nail   2. Corns   3. Hx of long term use of blood thinners    -Examined patient. -No new findings. No new orders. -Toenails 1-5 b/l were debrided in length and girth with sterile nail nippers and dremel without iatrogenic bleeding.  -Corn(s) R 5th toe pared  utilizing sterile scalpel blade without complication or incident. Total number debrided=1. -Patient to report any pedal injuries to medical professional immediately. -Dispensed new Silipos toe cap for right 5th digit. Apply every morning. Remove every evening. -Patient to continue soft, supportive shoe gear daily. -Patient/POA to call should there be question/concern in  the interim.  Return in about 3 months (around 09/24/2020).  Marzetta Board, DPM

## 2020-07-03 DIAGNOSIS — W57XXXS Bitten or stung by nonvenomous insect and other nonvenomous arthropods, sequela: Secondary | ICD-10-CM | POA: Diagnosis not present

## 2020-07-03 DIAGNOSIS — Z91014 Allergy to mammalian meats: Secondary | ICD-10-CM | POA: Diagnosis not present

## 2020-07-10 DIAGNOSIS — I480 Paroxysmal atrial fibrillation: Secondary | ICD-10-CM | POA: Diagnosis not present

## 2020-07-10 DIAGNOSIS — I10 Essential (primary) hypertension: Secondary | ICD-10-CM | POA: Diagnosis not present

## 2020-07-15 ENCOUNTER — Ambulatory Visit: Payer: Medicare PPO

## 2020-07-24 ENCOUNTER — Other Ambulatory Visit: Payer: Self-pay

## 2020-07-25 ENCOUNTER — Ambulatory Visit: Payer: Medicare PPO | Admitting: Internal Medicine

## 2020-07-25 ENCOUNTER — Encounter: Payer: Self-pay | Admitting: Internal Medicine

## 2020-07-25 VITALS — BP 130/72 | HR 53 | Temp 98.5°F | Wt 125.0 lb

## 2020-07-25 DIAGNOSIS — F411 Generalized anxiety disorder: Secondary | ICD-10-CM | POA: Diagnosis not present

## 2020-07-25 DIAGNOSIS — I10 Essential (primary) hypertension: Secondary | ICD-10-CM

## 2020-07-25 DIAGNOSIS — M810 Age-related osteoporosis without current pathological fracture: Secondary | ICD-10-CM

## 2020-07-25 DIAGNOSIS — E785 Hyperlipidemia, unspecified: Secondary | ICD-10-CM

## 2020-07-25 DIAGNOSIS — E538 Deficiency of other specified B group vitamins: Secondary | ICD-10-CM

## 2020-07-25 DIAGNOSIS — S32040A Wedge compression fracture of fourth lumbar vertebra, initial encounter for closed fracture: Secondary | ICD-10-CM | POA: Diagnosis not present

## 2020-07-25 DIAGNOSIS — Z91018 Allergy to other foods: Secondary | ICD-10-CM

## 2020-07-25 DIAGNOSIS — G459 Transient cerebral ischemic attack, unspecified: Secondary | ICD-10-CM

## 2020-07-25 DIAGNOSIS — I4891 Unspecified atrial fibrillation: Secondary | ICD-10-CM | POA: Diagnosis not present

## 2020-07-25 DIAGNOSIS — M5416 Radiculopathy, lumbar region: Secondary | ICD-10-CM | POA: Diagnosis not present

## 2020-07-25 DIAGNOSIS — D649 Anemia, unspecified: Secondary | ICD-10-CM | POA: Diagnosis not present

## 2020-07-25 LAB — CBC WITH DIFFERENTIAL/PLATELET
Basophils Absolute: 0 10*3/uL (ref 0.0–0.1)
Basophils Relative: 1 % (ref 0.0–3.0)
Eosinophils Absolute: 0.1 10*3/uL (ref 0.0–0.7)
Eosinophils Relative: 3.1 % (ref 0.0–5.0)
HCT: 40.8 % (ref 36.0–46.0)
Hemoglobin: 13.9 g/dL (ref 12.0–15.0)
Lymphocytes Relative: 38.1 % (ref 12.0–46.0)
Lymphs Abs: 1.6 10*3/uL (ref 0.7–4.0)
MCHC: 34.1 g/dL (ref 30.0–36.0)
MCV: 91.9 fl (ref 78.0–100.0)
Monocytes Absolute: 0.4 10*3/uL (ref 0.1–1.0)
Monocytes Relative: 9.3 % (ref 3.0–12.0)
Neutro Abs: 2.1 10*3/uL (ref 1.4–7.7)
Neutrophils Relative %: 48.5 % (ref 43.0–77.0)
Platelets: 190 10*3/uL (ref 150.0–400.0)
RBC: 4.44 Mil/uL (ref 3.87–5.11)
RDW: 12.7 % (ref 11.5–15.5)
WBC: 4.3 10*3/uL (ref 4.0–10.5)

## 2020-07-25 LAB — COMPREHENSIVE METABOLIC PANEL
ALT: 16 U/L (ref 0–35)
AST: 25 U/L (ref 0–37)
Albumin: 4.1 g/dL (ref 3.5–5.2)
Alkaline Phosphatase: 59 U/L (ref 39–117)
BUN: 8 mg/dL (ref 6–23)
CO2: 32 mEq/L (ref 19–32)
Calcium: 9.4 mg/dL (ref 8.4–10.5)
Chloride: 104 mEq/L (ref 96–112)
Creatinine, Ser: 0.63 mg/dL (ref 0.40–1.20)
GFR: 81.2 mL/min (ref >60.00)
Glucose, Bld: 83 mg/dL (ref 70–99)
Potassium: 3.9 mEq/L (ref 3.5–5.1)
Sodium: 141 mEq/L (ref 135–145)
Total Bilirubin: 0.6 mg/dL (ref 0.2–1.2)
Total Protein: 6.9 g/dL (ref 6.0–8.3)

## 2020-07-25 LAB — URINALYSIS
Bilirubin Urine: NEGATIVE
Ketones, ur: NEGATIVE
Leukocytes,Ua: NEGATIVE
Nitrite: NEGATIVE
Specific Gravity, Urine: 1.015 (ref 1.000–1.030)
Total Protein, Urine: NEGATIVE
Urine Glucose: NEGATIVE
Urobilinogen, UA: 0.2 (ref 0.0–1.0)
pH: 7 (ref 5.0–8.0)

## 2020-07-25 LAB — T4, FREE: Free T4: 0.9 ng/dL (ref 0.60–1.60)

## 2020-07-25 LAB — TSH: TSH: 1.7 u[IU]/mL (ref 0.35–4.50)

## 2020-07-25 MED ORDER — CYANOCOBALAMIN 1000 MCG/ML IJ SOLN
1000.0000 ug | Freq: Once | INTRAMUSCULAR | Status: AC
  Administered 2020-07-25: 1000 ug via INTRAMUSCULAR

## 2020-07-25 MED ORDER — DENOSUMAB 60 MG/ML ~~LOC~~ SOSY
60.0000 mg | PREFILLED_SYRINGE | Freq: Once | SUBCUTANEOUS | Status: AC
  Administered 2020-07-25: 60 mg via SUBCUTANEOUS

## 2020-07-25 NOTE — Progress Notes (Signed)
Subjective:  Patient ID: Alyssa Luna, female    DOB: [birth year]  Age: 86 y.o. MRN: [id number]  CC: Follow-up (3 Month f/u- B12 injection)   HPI DESAREE DOWNEN presents for osteoporosis - on Prolia, OA, hypothyroidism, LBP f/u  Outpatient Medications Prior to Visit  Medication Sig Dispense Refill  . acetaminophen (TYLENOL) 325 MG tablet Take by mouth.    [name] alum & mag hydroxide-simeth (MAALOX/MYLANTA) 200-200-20 MG/5ML suspension Take by mouth.    [name] amLODipine (NORVASC) 2.5 MG tablet     . B Complex-C (B-COMPLEX WITH VITAMIN C) tablet Take by mouth.    . busPIRone (BUSPAR) 7.5 MG tablet Take 1 tablet (7.5 mg total) by mouth 2 (two) times daily. 60 tablet 5  . cetirizine (ZYRTEC) 10 MG tablet Take 10 mg by mouth daily as needed for allergies.    . Cholecalciferol 50 MCG (2000 UT) TBDP Take by mouth daily.    . Cyanocobalamin (VITAMIN B-12 IJ) Inject as directed every 30 (thirty) days.    [name] denosumab (PROLIA) 60 MG/ML SOLN injection Inject 60 mg into the skin every 6 (six) months. Administer in upper arm, thigh, or abdomen Unable to tolerate oral meds Dx severe osteoporosis 1.8 mL 6  . diphenhydrAMINE (SOMINEX) 25 MG tablet Take by mouth.    . diphenhydrAMINE HCl (BENADRYL ALLERGY CHILDRENS PO) Take by mouth at bedtime as needed. Takes at night when itching    . famotidine (PEPCID) 20 MG tablet TAKE ONE TABLET BY MOUTH TWICE A DAY 180 tablet 3  . gabapentin (NEURONTIN) 600 MG tablet TAKE 1/2 TABLET BY MOUTH 4 TIMES A DAY    . levothyroxine (SYNTHROID) 25 MCG tablet Take 1 tablet (25 mcg total) by mouth daily before breakfast. 90 tablet 3  . montelukast (SINGULAIR) 10 MG tablet TAKE ONE TABLET BY MOUTH DAILY 90 tablet 1  . Rivaroxaban (XARELTO) 15 MG TABS tablet Take 15 mg by mouth daily.     . Simethicone (GAS-X PO) Take 1 tablet by mouth daily as needed (flatulence).     . cefUROXime (CEFTIN) 250 MG tablet Take 1 tablet (250 mg total) by mouth 2 (two) times daily with a meal.  (Patient not taking: Reported on 07/25/2020) 20 tablet 0  . fluconazole (DIFLUCAN) 150 MG tablet  (Patient not taking: Reported on 07/25/2020)     Facility-Administered Medications Prior to Visit  Medication Dose Route Frequency Provider Last Rate Last Admin  . cyanocobalamin ((VITAMIN B-12)) injection 1,000 mcg  1,000 mcg Intramuscular Q30 days Tresten Pantoja, Evie Lacks, MD   1,000 mcg at 06/13/20 1544    ROS: Review of Systems  Constitutional: Positive for fatigue. Negative for activity change, appetite change, chills and unexpected weight change.  HENT: Negative for congestion, mouth sores and sinus pressure.   Eyes: Negative for visual disturbance.  Respiratory: Negative for cough and chest tightness.   Gastrointestinal: Negative for abdominal pain and nausea.  Genitourinary: Negative for difficulty urinating, frequency and vaginal pain.  Musculoskeletal: Positive for arthralgias, back pain and gait problem.  Skin: Negative for pallor and rash.  Neurological: Positive for weakness. Negative for dizziness, tremors, numbness and headaches.  Psychiatric/Behavioral: Negative for confusion and sleep disturbance.    Objective:  BP 130/72 (BP Location: Left Arm)   Pulse (!) 53   Temp 98.5 F (36.9 C) (Oral)   Wt 125 lb (56.7 kg)   SpO2 97%   BMI 22.86 kg/m   BP Readings from Last 3 Encounters:  07/25/20 130/72  06/10/20 [name])  142/82  04/23/20 (!) 168/84    Wt Readings from Last 3 Encounters:  07/25/20 125 lb (56.7 kg)  06/10/20 122 lb (55.3 kg)  04/23/20 122 lb (55.3 kg)    Physical Exam Constitutional:      General: She is not in acute distress.    Appearance: She is well-developed.  HENT:     Head: Normocephalic.     Right Ear: External ear normal.     Left Ear: External ear normal.     Nose: Nose normal.     Mouth/Throat:     Mouth: Oropharynx is clear and moist.  Eyes:     General:        Right eye: No discharge.        Left eye: No discharge.      Conjunctiva/sclera: Conjunctivae normal.     Pupils: Pupils are equal, round, and reactive to light.  Neck:     Thyroid: No thyromegaly.     Vascular: No JVD.     Trachea: No tracheal deviation.  Cardiovascular:     Rate and Rhythm: Normal rate and regular rhythm.     Heart sounds: Normal heart sounds.  Pulmonary:     Effort: No respiratory distress.     Breath sounds: No stridor. No wheezing.  Abdominal:     General: Bowel sounds are normal. There is no distension.     Palpations: Abdomen is soft. There is no mass.     Tenderness: There is no abdominal tenderness. There is no guarding or rebound.  Musculoskeletal:        General: No tenderness or edema.     Cervical back: Normal range of motion and neck supple.  Lymphadenopathy:     Cervical: No cervical adenopathy.  Skin:    Findings: No erythema or rash.  Neurological:     Cranial Nerves: No cranial nerve deficit.     Motor: No abnormal muscle tone.     Coordination: Coordination abnormal.     Gait: Gait abnormal.     Deep Tendon Reflexes: Reflexes normal.  Psychiatric:        Mood and Affect: Mood and affect normal.        Behavior: Behavior normal.        Thought Content: Thought content normal.        Judgment: Judgment normal.   walker  Lab Results  Component Value Date   WBC 4.6 12/14/2019   HGB 13.9 12/14/2019   HCT 42.3 12/14/2019   PLT 181.0 12/14/2019   GLUCOSE 91 12/14/2019   CHOL 160 02/25/2018   TRIG 119.0 02/25/2018   HDL 42.90 02/25/2018   LDLDIRECT 178.0 09/24/2011   LDLCALC 94 02/25/2018   ALT 13 12/14/2019   AST 23 12/14/2019   NA 142 12/14/2019   K 4.0 12/14/2019   CL 105 12/14/2019   CREATININE 0.72 12/14/2019   BUN 11 12/14/2019   CO2 31 12/14/2019   TSH 2.26 10/17/2019   INR 1.3 (H) 02/07/2019    No results found.  Assessment & Plan:    Walker Kehr, MD

## 2020-07-25 NOTE — Assessment & Plan Note (Signed)
B12 inj

## 2020-07-25 NOTE — Assessment & Plan Note (Signed)
On Lipitor 

## 2020-07-25 NOTE — Assessment & Plan Note (Signed)
Try Lion's mane

## 2020-07-25 NOTE — Assessment & Plan Note (Signed)
Dexa scan Cont w/Prolia

## 2020-07-25 NOTE — Assessment & Plan Note (Signed)
In NSR now Xarelto

## 2020-07-25 NOTE — Patient Instructions (Signed)
   B-complex with Niacin 100 mg    Lion's mane

## 2020-07-25 NOTE — Assessment & Plan Note (Signed)
Clonazepam prn

## 2020-07-26 LAB — IRON,TIBC AND FERRITIN PANEL
%SAT: 21 % (calc) (ref 16–45)
Ferritin: 44 ng/mL (ref 16–288)
Iron: 60 ug/dL (ref 45–160)
TIBC: 290 mcg/dL (calc) (ref 250–450)

## 2020-07-27 NOTE — Assessment & Plan Note (Signed)
Blood pressure seems to be well controlled.

## 2020-07-27 NOTE — Assessment & Plan Note (Signed)
Discussed.  Seems to be unchanged.  She has been avoiding beef and milk products

## 2020-07-27 NOTE — Assessment & Plan Note (Signed)
On Xarelto 

## 2020-08-24 ENCOUNTER — Other Ambulatory Visit: Payer: Self-pay | Admitting: Internal Medicine

## 2020-08-26 ENCOUNTER — Ambulatory Visit (INDEPENDENT_AMBULATORY_CARE_PROVIDER_SITE_OTHER): Payer: Medicare PPO

## 2020-08-26 ENCOUNTER — Other Ambulatory Visit: Payer: Self-pay

## 2020-08-26 DIAGNOSIS — E538 Deficiency of other specified B group vitamins: Secondary | ICD-10-CM

## 2020-08-26 NOTE — Progress Notes (Signed)
Pt here for monthly B12 injection per Dr Alain Marion.  B12 1084mg given IM right deltoid and pt tolerated injection well.  Next B12 injection scheduled for 09/27/20.  Dr JRonnald Ramp Can you please cosign since PCP is out of office?

## 2020-08-30 DIAGNOSIS — Z03818 Encounter for observation for suspected exposure to other biological agents ruled out: Secondary | ICD-10-CM | POA: Diagnosis not present

## 2020-09-11 ENCOUNTER — Ambulatory Visit: Payer: Medicare PPO | Admitting: Specialist

## 2020-09-12 ENCOUNTER — Encounter: Payer: Self-pay | Admitting: Specialist

## 2020-09-12 ENCOUNTER — Ambulatory Visit: Payer: Medicare PPO | Admitting: Specialist

## 2020-09-12 ENCOUNTER — Other Ambulatory Visit: Payer: Self-pay

## 2020-09-12 VITALS — BP 121/77 | HR 61 | Ht 62.0 in | Wt 125.0 lb

## 2020-09-12 DIAGNOSIS — M48061 Spinal stenosis, lumbar region without neurogenic claudication: Secondary | ICD-10-CM

## 2020-09-12 DIAGNOSIS — M8000XA Age-related osteoporosis with current pathological fracture, unspecified site, initial encounter for fracture: Secondary | ICD-10-CM | POA: Diagnosis not present

## 2020-09-12 DIAGNOSIS — M5410 Radiculopathy, site unspecified: Secondary | ICD-10-CM

## 2020-09-12 DIAGNOSIS — M2170 Unequal limb length (acquired), unspecified site: Secondary | ICD-10-CM

## 2020-09-12 DIAGNOSIS — M48062 Spinal stenosis, lumbar region with neurogenic claudication: Secondary | ICD-10-CM

## 2020-09-12 DIAGNOSIS — M4156 Other secondary scoliosis, lumbar region: Secondary | ICD-10-CM

## 2020-09-12 DIAGNOSIS — M4125 Other idiopathic scoliosis, thoracolumbar region: Secondary | ICD-10-CM | POA: Diagnosis not present

## 2020-09-12 MED ORDER — TRAMADOL-ACETAMINOPHEN 37.5-325 MG PO TABS
1.0000 | ORAL_TABLET | Freq: Four times a day (QID) | ORAL | 0 refills | Status: DC | PRN
Start: 2020-09-12 — End: 2020-10-23

## 2020-09-12 NOTE — Progress Notes (Signed)
Office Visit Note   Patient: Alyssa Luna           Date of Birth: 1933-11-13           MRN: [id number] Visit Date: 09/12/2020              Requested by: Cassandria Anger, MD [place],  [place] [id number] PCP: Plotnikov, Evie Lacks, MD   Assessment & Plan: Visit Diagnoses:  1. Other secondary scoliosis, lumbar region   2. Idiopathic scoliosis of thoracolumbar region   3. Spinal stenosis of lumbar region with neurogenic claudication   4. Spinal stenosis of lumbar region without neurogenic claudication   5. Leg length discrepancy   6. Age-related osteoporosis with current pathological fracture, initial encounter   7. Back pain with right-sided radiculopathy     Plan:Avoid bending, stooping and avoid lifting weights greater than 10 lbs. Avoid prolong standing and walking. Avoid frequent bending and stooping  No lifting greater than 10 lbs. May use ice or moist heat for pain. Weight loss is of benefit. Handicap license is approved. Ultracet one tablet every 6-8 hours per pain.   Follow-Up Instructions: No follow-ups on file.   Orders:  No orders of the defined types were placed in this encounter.  No orders of the defined types were placed in this encounter.     Procedures: No procedures performed   Clinical Data: No additional findings.   Subjective: Chief Complaint  Patient presents with  . 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 She has had a history of thoracolumbar scoliosis an has primary back pain that is into the thoracolumbar junction downwards to the pelvis. She is having intention tremor and has been told that gamma ray surgery may improve this. She is concern as to whether the spine could be fused.   Review of Systems  Constitutional: Negative.   HENT: Negative.   Eyes: Negative.   Respiratory: Negative.   Cardiovascular: Negative.    Gastrointestinal: Negative.   Endocrine: Negative.   Genitourinary: Negative.   Musculoskeletal: Negative.   Skin: Negative.   Allergic/Immunologic: Negative.   Neurological: Negative.   Hematological: Negative.   Psychiatric/Behavioral: Negative.      Objective: Vital Signs: BP 121/77 (BP Location: Left Arm, Patient Position: Sitting)   Pulse 61   Ht 5' 2"  (1.575 m)   Wt 125 lb (56.7 kg)   BMI 22.86 kg/m   Physical Exam Constitutional:      Appearance: She is well-developed and well-nourished.  HENT:     Head: Normocephalic and atraumatic.  Eyes:     Extraocular Movements: EOM normal.     Pupils: Pupils are equal, round, and reactive to light.  Pulmonary:     Effort: Pulmonary effort is normal.     Breath sounds: Normal breath sounds.  Abdominal:     General: Bowel sounds are normal.     Palpations: Abdomen is soft.  Musculoskeletal:        General: Normal range of motion.     Cervical back: Normal range of motion and neck supple.  Skin:    General: Skin is warm and dry.  Neurological:     Mental Status: She is alert and oriented to person, place, and time.  Psychiatric:        Mood and Affect: Mood and affect normal.        Behavior: Behavior  normal.        Thought Content: Thought content normal.        Judgment: Judgment normal.     Ortho Exam  Specialty Comments:  No specialty comments available.  Imaging: No results found.   PMFS History: Patient Active Problem List   Diagnosis Date Noted  . Acute sinusitis 12/14/2019  . Acquired hypothyroidism 07/20/2019  . History of atrial fibrillation 07/20/2019  . RBBB 07/20/2019  . Anemia 07/19/2019  . Hoarseness of voice 06/21/2019  . Anxiety disorder 04/12/2019  . Chronic venous insufficiency 03/25/2019  . Dark stools 02/24/2019  . Meningioma, cerebral ([place]) 02/16/2019  . Grief reaction 10/18/2018  . Dysfunction of left eustachian tube 07/07/2018  . Ear pain, left 06/13/2018  . Colitis, acute  03/14/2018  . Constipation 03/14/2018  . Allergy 04/28/2017  . Dysphonia 03/17/2017  . Arthralgia 02/26/2017  . Dyspnea 01/27/2017  . Tick bite 01/27/2017  . Food allergy 01/27/2017  . Ingrown toenail 12/08/2016  . Acute upper respiratory infection 07/13/2016  . [facility] spotted fever) 04/29/2016  . 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 Onychomycosis 02/04/2016  . Osteoporosis, post-menopausal 12/15/2015  . Lumbar radiculopathy 08/14/2015  . Lumbar scoliosis 08/14/2015  . Long term current use of anticoagulant therapy 07/18/2015  . Dysuria 06/16/2015  . Allergic rhinitis 06/13/2015  . Closed wedge compression fracture of fourth lumbar vertebra ([place])   . Thrush, oral 01/04/2015  . Itching 01/04/2015  . Hypokalemia 11/06/2014  . Fatigue 10/19/2014  . LLQ abdominal pain 10/02/2014  . Swelling of left knee joint 10/02/2014  . Nausea without vomiting 10/02/2014  . DJD (degenerative joint disease) of knee 08/27/2014  . Primary localized osteoarthritis of left knee   . Breast cancer ([place])   . GERD (gastroesophageal reflux disease)   . Preop exam for internal medicine 06/13/2014  . Anxiety state 06/13/2014  . Bradycardia 11/09/2013  . Essential hypertension 11/09/2013  . Encounter for therapeutic drug monitoring 09/08/2013  . Well adult exam 05/21/2013  . Preop cardiovascular exam 05/08/2013  . Tremor, essential 12/14/2012  . Right hip pain 05/10/2012  . Vertigo 03/09/2012  . Dyslipidemia 03/31/2011  . Meningioma ([place]) 02/03/2011  . TIA (transient ischemic attack) 11/13/2010  . Abnormal CT of brain 11/13/2010  . CAROTID BRUIT 07/28/2010  . Edema 05/02/2010  . Atrial fibrillation ([place]) 04/08/2010  . SYNCOPE 03/31/2010  . LOW BACK PAIN 06/05/2009  . Chronic maxillary sinusitis 01/31/2009  . EFFUSION OF JOINT OTHER SPECIFIED SITE 01/31/2009  . Diarrhea 05/31/2008  . ABNORMAL CHEST XRAY 05/15/2008  . HEMATURIA, MICROSCOPIC, HX OF 08/04/2007  . B12 deficiency  03/21/2007  . Vitamin D deficiency 03/21/2007  . Disease of tricuspid valve 03/21/2007  . DIVERTICULOSIS, COLON 03/21/2007  . Osteoarthritis 03/21/2007  . Osteoporosis 03/21/2007  . Personal history of malignant neoplasm of breast 03/21/2007  . COLONIC POLYPS, HX OF 03/21/2007   Past Medical History:  Diagnosis Date  . AF (atrial fibrillation) ([place])   . Allergy    mild   . Alpha galactosidase deficiency   . Anemia   . Anxiety    has lorazepam on hand for nervousness, pt. reports that she had a break-in to her home on 05/2014  . Atrial fibrillation ([place])    D Taylor  . Breast cancer ([place]) 1984   left   . Cataract    removed both eyes   . Colon polyps   . Coronary artery disease   . Diverticulosis   . Diverticulosis of colon   .  Dysrhythmia    atrial fib  . Esophageal dysmotility   . Familial tremor   . GERD (gastroesophageal reflux disease)   . Hemorrhoids   . Hiatal hernia   . History of breast cancer   . History of hiatal hernia   . History of ischemic colitis   . Hyperlipidemia   . Hypertension   . Internal hemorrhoids   . LBP (low back pain)   . Meningioma ([place])   . Neuromuscular disorder ([place])    essential tremor  . Osteoarthritis    hands & back & knees   . Osteoporosis   . Primary localized osteoarthritis of left knee   . Renal cyst   . [facility] spotted fever   . Schatzki's ring   . Scoliosis   . Stroke [facility])    TIA per pt   . TIA (transient ischemic attack)   . TR (tricuspid regurgitation)    Mild  . Vitamin B12 deficiency   . Vitamin D deficiency     Family History  Problem Relation Age of Onset  . Breast cancer Mother 64  . Tremor Mother   . Tremor Brother   . Colon cancer Maternal Aunt   . Ovarian cancer Maternal Grandmother   . Early death Neg Hx   . Stroke Neg Hx   . Esophageal cancer Neg Hx   . Stomach cancer Neg Hx   . Pancreatic cancer Neg Hx   . Liver disease Neg Hx   . Inflammatory bowel disease Neg Hx   . Colon polyps  Neg Hx   . Rectal cancer Neg Hx     Past Surgical History:  Procedure Laterality Date  . AUGMENTATION MAMMAPLASTY    . BLEPHAROPLASTY Bilateral   . CATARACT EXTRACTION, BILATERAL    . COLONOSCOPY    . gamma knife procedure  02/2019   to help the tremor at [place] LUMBAR  02/07/2019  . IR KYPHO LUMBAR INC FX REDUCE BONE BX UNI/BIL CANNULATION INC/IMAGING  02/07/2019  . JOINT REPLACEMENT Right   . kytoplasy    . MASTECTOMY Bilateral 1984  . OOPHORECTOMY     BSO  . POLYPECTOMY    . TOTAL KNEE ARTHROPLASTY  Dec 2011   Right - Dr Noemi Chapel  . TOTAL KNEE ARTHROPLASTY Left 08/27/2014   Procedure: LEFT TOTAL KNEE ARTHROPLASTY;  Surgeon: Lorn Junes, MD;  Location: [place] [facility];  Service: Orthopedics;  Laterality: Left;  . UPPER GASTROINTESTINAL ENDOSCOPY    . VAGINAL HYSTERECTOMY     LAVH BSO   Social History   Occupational History  . Occupation: Retired    Fish farm manager: RETIRED  . Occupation: potter  Tobacco Use  . Smoking status: Never Smoker  . Smokeless tobacco: Never Used  Vaping Use  . Vaping Use: Never used  Substance and Sexual Activity  . Alcohol use: No    Alcohol/week: 0.0 standard drinks  . Drug use: No  . Sexual activity: Never    Birth control/protection: Surgical, Post-menopausal    Comment: HYST

## 2020-09-12 NOTE — Addendum Note (Signed)
Addended by: Basil Dess on: 09/12/2020 03:22 PM   Modules accepted: Orders

## 2020-09-12 NOTE — Patient Instructions (Signed)
Plan:Avoid bending, stooping and avoid lifting weights greater than 10 lbs. Avoid prolong standing and walking. Avoid frequent bending and stooping  No lifting greater than 10 lbs. May use ice or moist heat for pain. Weight loss is of benefit. Handicap license is approved. Ultracet one tablet every 6-8 hours per pain.

## 2020-09-16 DIAGNOSIS — M85832 Other specified disorders of bone density and structure, left forearm: Secondary | ICD-10-CM | POA: Diagnosis not present

## 2020-09-16 DIAGNOSIS — M85852 Other specified disorders of bone density and structure, left thigh: Secondary | ICD-10-CM | POA: Diagnosis not present

## 2020-09-16 DIAGNOSIS — M85851 Other specified disorders of bone density and structure, right thigh: Secondary | ICD-10-CM | POA: Diagnosis not present

## 2020-09-16 LAB — HM DEXA SCAN

## 2020-09-23 ENCOUNTER — Encounter: Payer: Self-pay | Admitting: Internal Medicine

## 2020-09-25 DIAGNOSIS — I480 Paroxysmal atrial fibrillation: Secondary | ICD-10-CM | POA: Diagnosis not present

## 2020-09-25 DIAGNOSIS — I10 Essential (primary) hypertension: Secondary | ICD-10-CM | POA: Diagnosis not present

## 2020-09-27 ENCOUNTER — Ambulatory Visit: Payer: Medicare PPO

## 2020-10-01 ENCOUNTER — Other Ambulatory Visit: Payer: Self-pay

## 2020-10-02 ENCOUNTER — Ambulatory Visit: Payer: Medicare PPO | Admitting: Podiatry

## 2020-10-02 ENCOUNTER — Encounter: Payer: Self-pay | Admitting: Podiatry

## 2020-10-02 ENCOUNTER — Ambulatory Visit (INDEPENDENT_AMBULATORY_CARE_PROVIDER_SITE_OTHER): Payer: Medicare PPO

## 2020-10-02 DIAGNOSIS — M79609 Pain in unspecified limb: Secondary | ICD-10-CM

## 2020-10-02 DIAGNOSIS — B351 Tinea unguium: Secondary | ICD-10-CM

## 2020-10-02 DIAGNOSIS — E538 Deficiency of other specified B group vitamins: Secondary | ICD-10-CM | POA: Diagnosis not present

## 2020-10-02 DIAGNOSIS — M2041 Other hammer toe(s) (acquired), right foot: Secondary | ICD-10-CM

## 2020-10-02 DIAGNOSIS — L84 Corns and callosities: Secondary | ICD-10-CM

## 2020-10-02 DIAGNOSIS — Z9229 Personal history of other drug therapy: Secondary | ICD-10-CM

## 2020-10-02 DIAGNOSIS — M79674 Pain in right toe(s): Secondary | ICD-10-CM

## 2020-10-02 MED ORDER — CYANOCOBALAMIN 1000 MCG/ML IJ SOLN
1000.0000 ug | Freq: Once | INTRAMUSCULAR | Status: AC
  Administered 2020-10-02: 1000 ug via INTRAMUSCULAR

## 2020-10-02 NOTE — Progress Notes (Addendum)
B12 injection given. Please cosign.   Medical screening examination/treatment/procedure(s) were performed by non-physician practitioner and as supervising physician I was immediately available for consultation/collaboration.  I agree with above. Lew Dawes, MD

## 2020-10-06 NOTE — Progress Notes (Signed)
Subjective: Alyssa Luna is a 87 y.o. female patient seen today long term blood thinner, Xarelto, and presents today with painful, discolored, thick toenails which interfere with daily activities. Patient also has painful corn right 5th digit secondary to hammertoe deformity.Patient states her right 5th digit is tender today.  She voices no new pedal problems on today's visit.  Allergies  Allergen Reactions  . Alpha-Gal Itching  . Beef (Bovine) Protein Other (See Comments)    Alpha-gal allergy testing positive June 2018  . Galactose Nausea Only    Per patient, cannot have alpha-gal since tic bite episode.  . Pravastatin Anaphylaxis and Other (See Comments)    Legs ache  . Alpha-D-Galactosidase Itching  . Amlodipine     Leg swelling  . Amlodipine-Atorvastatin     swelling  . Cefdinir     Nausea from suspension  . Colchicine Other (See Comments)  . Doxycycline Other (See Comments)    Nausea - able to take w/food  . Shellfish Allergy Other (See Comments)    Unknown  . Tikosyn [Dofetilide] Other (See Comments)    Unknown reaction and was told never to take it due to problems during sleep   . Actonel [Risedronate Sodium] Other (See Comments)    Not known  . Alendronate Other (See Comments)    NOT KNOWN  . Augmentin [Amoxicillin-Pot Clavulanate] Rash    Has patient had a PCN reaction causing immediate rash, facial/tongue/throat swelling, SOB or lightheadedness with hypotension: Yes Has patient had a PCN reaction causing severe rash involving mucus membranes or skin necrosis:No Has patient had a PCN reaction that required hospitalization: No Has patient had a PCN reaction occurring within the last 10 years: No If all of the above answers are "NO", then may proceed with Cephalosporin use.   [name] Beef Allergy Rash  . Cheese Rash  . Ciprofloxacin Nausea Only  . Evista [Raloxifene] Other (See Comments)    Unknown reaction  . Flagyl [Metronidazole] Other (See Comments)    Not known   . Fosamax [Alendronate Sodium] Other (See Comments)    NOT KNOWN  . Gold-Containing Drug Products Other (See Comments)    Per Dr  . Loma Sousa [Escitalopram Oxalate] Other (See Comments)    shaky  . Other Rash  . Pork Allergy Rash  . Risedronate Other (See Comments)    Unknown reaction  . Simvastatin Other (See Comments)    REACTION: leg cramps, weakness  . Tramadol Other (See Comments)    Mental disturbance changes    Objective: Physical Exam  General: Patient is a pleasant 87 y.o. Caucasian female WD, WN in NAD. AAO x 3.   Neurovascular Examination: Capillary refill time to digits immediate b/l. Palpable pedal pulses b/l LE. Pedal hair sparse. Lower extremity skin temperature gradient within normal limits.   Protective sensation intact 5/5 intact bilaterally with 10g monofilament b/l. Vibratory sensation intact b/l. Proprioception intact bilaterally.  Dermatological:  Pedal skin with normal turgor, texture and tone bilaterally. No open wounds bilaterally. No interdigital macerations bilaterally. Toenails 1-5 b/l elongated, discolored, dystrophic, thickened, crumbly with subungual debris and tenderness to dorsal palpation. Incurvated nailplate lateral border(s) R hallux.  Nail border hypertrophy present. There is tenderness to palpation. Sign(s) of infection: no clinical signs of infection noted on examination today.. Hyperkeratotic lesion(s) R 5th toe.  No erythema, no edema, no drainage, no fluctuance.  Musculoskeletal:  Normal muscle strength 5/5 to all lower extremity muscle groups bilaterally. No pain crepitus or joint limitation noted with ROM b/l.  Hammertoes noted to the R 5th toe.  Assessment and Plan:  1. Pain due to onychomycosis of nail   2. Corns   3. Pain of toe of right foot   4. Hammer toe of right foot   5. Hx of long term use of blood thinners    -Examined patient. -No new findings. No new orders. -Toenails 1-5 b/l were debrided in length and girth with  sterile nail nippers and dremel without iatrogenic bleeding.. -Offending nail borders debrided and curretaged R hallux. Borders cleansed with alcohol. Antibiotic ointment applied. She was instructed to apply Neosporin to right hallux once daily for 7 days.  -Corn(s) R 5th toe pared utilizing sterile scalpel blade without complication or incident. Total number debrided=1. -Patient to report any pedal injuries to medical professional immediately. -Dispensed new Silipos toe cap for right 5th digit. Apply every morning. Remove every evening. -Patient to continue soft, supportive shoe gear daily. -Patient/POA to call should there be question/concern in the interim.  Return in about 3 months (around 12/30/2020).  Marzetta Board, DPM

## 2020-10-07 DIAGNOSIS — M419 Scoliosis, unspecified: Secondary | ICD-10-CM | POA: Diagnosis not present

## 2020-10-07 DIAGNOSIS — G8929 Other chronic pain: Secondary | ICD-10-CM | POA: Diagnosis not present

## 2020-10-07 DIAGNOSIS — Z1159 Encounter for screening for other viral diseases: Secondary | ICD-10-CM | POA: Diagnosis not present

## 2020-10-07 DIAGNOSIS — Z79899 Other long term (current) drug therapy: Secondary | ICD-10-CM | POA: Diagnosis not present

## 2020-10-07 DIAGNOSIS — M129 Arthropathy, unspecified: Secondary | ICD-10-CM | POA: Diagnosis not present

## 2020-10-07 DIAGNOSIS — M546 Pain in thoracic spine: Secondary | ICD-10-CM | POA: Diagnosis not present

## 2020-10-07 DIAGNOSIS — M5441 Lumbago with sciatica, right side: Secondary | ICD-10-CM | POA: Diagnosis not present

## 2020-10-22 DIAGNOSIS — R202 Paresthesia of skin: Secondary | ICD-10-CM | POA: Diagnosis not present

## 2020-10-22 DIAGNOSIS — D329 Benign neoplasm of meninges, unspecified: Secondary | ICD-10-CM | POA: Diagnosis not present

## 2020-10-22 DIAGNOSIS — G250 Essential tremor: Secondary | ICD-10-CM | POA: Diagnosis not present

## 2020-10-22 DIAGNOSIS — G603 Idiopathic progressive neuropathy: Secondary | ICD-10-CM | POA: Diagnosis not present

## 2020-10-22 DIAGNOSIS — R2681 Unsteadiness on feet: Secondary | ICD-10-CM | POA: Diagnosis not present

## 2020-10-22 DIAGNOSIS — R200 Anesthesia of skin: Secondary | ICD-10-CM | POA: Diagnosis not present

## 2020-10-23 ENCOUNTER — Other Ambulatory Visit: Payer: Self-pay

## 2020-10-23 ENCOUNTER — Ambulatory Visit: Payer: Medicare PPO | Admitting: Internal Medicine

## 2020-10-23 ENCOUNTER — Encounter: Payer: Self-pay | Admitting: Internal Medicine

## 2020-10-23 DIAGNOSIS — E559 Vitamin D deficiency, unspecified: Secondary | ICD-10-CM | POA: Diagnosis not present

## 2020-10-23 DIAGNOSIS — G8929 Other chronic pain: Secondary | ICD-10-CM | POA: Diagnosis not present

## 2020-10-23 DIAGNOSIS — M5440 Lumbago with sciatica, unspecified side: Secondary | ICD-10-CM

## 2020-10-23 DIAGNOSIS — I4891 Unspecified atrial fibrillation: Secondary | ICD-10-CM

## 2020-10-23 MED ORDER — LORAZEPAM 0.5 MG PO TABS: ORAL_TABLET | ORAL | 3 refills | Status: DC

## 2020-10-23 NOTE — Assessment & Plan Note (Signed)
Try VEGAN CBD GUMMIES 10-20 mg twice a day

## 2020-10-23 NOTE — Assessment & Plan Note (Signed)
On Vit D 

## 2020-10-23 NOTE — Patient Instructions (Signed)
Try VEGAN CBD GUMMIES 10-20 mg twice a day

## 2020-10-23 NOTE — Progress Notes (Signed)
Subjective:  Patient ID: Alyssa Luna, female    DOB: [birth year]  Age: 87 y.o. MRN: [id number]  CC: Follow-up (3 months f/u- Pt states she started back taking the Lorazepam been having some increase anxiety. Would like new prescription)   HPI Alyssa Luna presents for food allergies, anxiety, gait disorder  Outpatient Medications Prior to Visit  Medication Sig Dispense Refill  . acetaminophen (TYLENOL) 325 MG tablet Take by mouth.    [name] alum & mag hydroxide-simeth (MAALOX/MYLANTA) 200-200-20 MG/5ML suspension Take by mouth.    . B Complex-C (B-COMPLEX WITH VITAMIN C) tablet Take by mouth.    . bismuth subsalicylate (PEPTO BISMOL) 262 MG/15ML suspension Take 30 mLs by mouth every 6 (six) hours as needed.    . busPIRone (BUSPAR) 7.5 MG tablet Take 1 tablet (7.5 mg total) by mouth 2 (two) times daily. 60 tablet 5  . cetirizine (ZYRTEC) 10 MG tablet Take 10 mg by mouth daily as needed for allergies.    . Cholecalciferol 50 MCG (2000 UT) TBDP Take by mouth daily.    . Cyanocobalamin (VITAMIN B-12 IJ) Inject as directed every 30 (thirty) days.    [name] denosumab (PROLIA) 60 MG/ML SOLN injection Inject 60 mg into the skin every 6 (six) months. Administer in upper arm, thigh, or abdomen Unable to tolerate oral meds Dx severe osteoporosis 1.8 mL 6  . diphenhydrAMINE HCl (BENADRYL ALLERGY CHILDRENS PO) Take by mouth at bedtime as needed. Takes at night when itching    . famotidine (PEPCID) 20 MG tablet TAKE ONE TABLET BY MOUTH TWICE A DAY 180 tablet 3  . gabapentin (NEURONTIN) 600 MG tablet TAKE 1/2 TABLET BY MOUTH 4 TIMES A DAY    . levothyroxine (SYNTHROID) 25 MCG tablet TAKE ONE TABLET BY MOUTH DAILY BEFORE BREAKFAST 90 tablet 3  . LORazepam (ATIVAN) 0.5 MG tablet Take 1/2-1 tablet daily as needed for anxiety and sleep    . montelukast (SINGULAIR) 10 MG tablet TAKE ONE TABLET BY MOUTH DAILY 90 tablet 1  . Rivaroxaban (XARELTO) 15 MG TABS tablet Take 15 mg by mouth daily.     .  Simethicone (GAS-X PO) Take 1 tablet by mouth daily as needed (flatulence).     . diphenhydrAMINE (SOMINEX) 25 MG tablet Take by mouth. (Patient not taking: Reported on 10/23/2020)    . traMADol-acetaminophen (ULTRACET) 37.5-325 MG tablet Take 1 tablet by mouth every 6 (six) hours as needed. (Patient not taking: Reported on 10/23/2020) 30 tablet 0   Facility-Administered Medications Prior to Visit  Medication Dose Route Frequency Provider Last Rate Last Admin  . cyanocobalamin ((VITAMIN B-12)) injection 1,000 mcg  1,000 mcg Intramuscular Q30 days Maison Kestenbaum, Evie Lacks, MD   1,000 mcg at 08/26/20 1507    ROS: Review of Systems  Constitutional: Positive for fatigue. Negative for activity change, appetite change, chills and unexpected weight change.  HENT: Negative for congestion, mouth sores and sinus pressure.   Eyes: Negative for visual disturbance.  Respiratory: Negative for cough and chest tightness.   Gastrointestinal: Negative for abdominal pain and nausea.  Genitourinary: Negative for difficulty urinating, frequency and vaginal pain.  Musculoskeletal: Positive for arthralgias, back pain and gait problem.  Skin: Negative for pallor and rash.  Neurological: Negative for dizziness, tremors, weakness, numbness and headaches.  Psychiatric/Behavioral: Negative for confusion, sleep disturbance and suicidal ideas. The patient is nervous/anxious.     Objective:  BP 128/70 (BP Location: Left Arm)   Pulse 60   Temp 97.8 F (36.6 C) (  Oral)   Ht 5' 2"  (1.575 m)   Wt 126 lb 6.4 oz (57.3 kg)   SpO2 95%   BMI 23.12 kg/m   BP Readings from Last 3 Encounters:  10/23/20 128/70  09/12/20 121/77  07/25/20 130/72    Wt Readings from Last 3 Encounters:  10/23/20 126 lb 6.4 oz (57.3 kg)  09/12/20 125 lb (56.7 kg)  07/25/20 125 lb (56.7 kg)    Physical Exam Constitutional:      General: She is not in acute distress.    Appearance: Normal appearance. She is well-developed.  HENT:     Head:  Normocephalic.     Right Ear: External ear normal.     Left Ear: External ear normal.     Nose: Nose normal.     Mouth/Throat:     Mouth: Oropharynx is clear and moist.  Eyes:     General:        Right eye: No discharge.        Left eye: No discharge.     Conjunctiva/sclera: Conjunctivae normal.     Pupils: Pupils are equal, round, and reactive to light.  Neck:     Thyroid: No thyromegaly.     Vascular: No JVD.     Trachea: No tracheal deviation.  Cardiovascular:     Rate and Rhythm: Normal rate and regular rhythm.     Heart sounds: Normal heart sounds.  Pulmonary:     Effort: No respiratory distress.     Breath sounds: No stridor. No wheezing.  Abdominal:     General: Bowel sounds are normal. There is no distension.     Palpations: Abdomen is soft. There is no mass.     Tenderness: There is no abdominal tenderness. There is no guarding or rebound.  Musculoskeletal:        General: Tenderness present. No edema.     Cervical back: Normal range of motion and neck supple.  Lymphadenopathy:     Cervical: No cervical adenopathy.  Skin:    Findings: No erythema or rash.  Neurological:     Cranial Nerves: No cranial nerve deficit.     Motor: No abnormal muscle tone.     Coordination: Coordination abnormal.     Deep Tendon Reflexes: Reflexes normal.  Psychiatric:        Mood and Affect: Mood and affect and mood normal.        Behavior: Behavior normal.        Thought Content: Thought content normal.        Judgment: Judgment normal.    Using a walker Kyphosis   Lab Results  Component Value Date   WBC 4.3 07/25/2020   HGB 13.9 07/25/2020   HCT 40.8 07/25/2020   PLT 190.0 07/25/2020   GLUCOSE 83 07/25/2020   CHOL 160 02/25/2018   TRIG 119.0 02/25/2018   HDL 42.90 02/25/2018   LDLDIRECT 178.0 09/24/2011   LDLCALC 94 02/25/2018   ALT 16 07/25/2020   AST 25 07/25/2020   NA 141 07/25/2020   K 3.9 07/25/2020   CL 104 07/25/2020   CREATININE 0.63 07/25/2020   BUN 8  07/25/2020   CO2 32 07/25/2020   TSH 1.70 07/25/2020   INR 1.3 (H) 02/07/2019    No results found.  Assessment & Plan:    Walker Kehr, MD

## 2020-10-23 NOTE — Assessment & Plan Note (Addendum)
On Xarelto Pacerone was d/c'd

## 2020-10-30 ENCOUNTER — Other Ambulatory Visit: Payer: Self-pay

## 2020-10-30 ENCOUNTER — Ambulatory Visit (INDEPENDENT_AMBULATORY_CARE_PROVIDER_SITE_OTHER): Payer: Medicare PPO | Admitting: *Deleted

## 2020-10-30 DIAGNOSIS — E538 Deficiency of other specified B group vitamins: Secondary | ICD-10-CM | POA: Diagnosis not present

## 2020-10-30 MED ORDER — CYANOCOBALAMIN 1000 MCG/ML IJ SOLN
1000.0000 ug | Freq: Once | INTRAMUSCULAR | Status: AC
  Administered 2020-10-30: 1000 ug via INTRAMUSCULAR

## 2020-10-30 NOTE — Progress Notes (Signed)
Pls cosign for B12 inj in absence of PCP.Marland KitchenJohny Chess

## 2020-12-02 ENCOUNTER — Other Ambulatory Visit: Payer: Self-pay

## 2020-12-02 ENCOUNTER — Ambulatory Visit (INDEPENDENT_AMBULATORY_CARE_PROVIDER_SITE_OTHER): Payer: Medicare PPO

## 2020-12-02 DIAGNOSIS — E538 Deficiency of other specified B group vitamins: Secondary | ICD-10-CM | POA: Diagnosis not present

## 2020-12-02 NOTE — Progress Notes (Addendum)
Pt here for monthly B12 injection per Dr. Alain Marion  B12 1018mg given right deltoid IM, and pt tolerated injection well.  Next B12 injection scheduled for 01/01/21  Medical screening examination/treatment/procedure(s) were performed by non-physician practitioner and as supervising physician I was immediately available for consultation/collaboration.  I agree with above. ALew Dawes MD

## 2020-12-11 DIAGNOSIS — Z91018 Allergy to other foods: Secondary | ICD-10-CM | POA: Diagnosis not present

## 2020-12-12 ENCOUNTER — Ambulatory Visit: Payer: Medicare PPO | Admitting: Internal Medicine

## 2020-12-12 ENCOUNTER — Other Ambulatory Visit: Payer: Self-pay

## 2020-12-12 ENCOUNTER — Encounter: Payer: Self-pay | Admitting: Internal Medicine

## 2020-12-12 DIAGNOSIS — E559 Vitamin D deficiency, unspecified: Secondary | ICD-10-CM | POA: Diagnosis not present

## 2020-12-12 DIAGNOSIS — D649 Anemia, unspecified: Secondary | ICD-10-CM | POA: Diagnosis not present

## 2020-12-12 DIAGNOSIS — E538 Deficiency of other specified B group vitamins: Secondary | ICD-10-CM

## 2020-12-12 DIAGNOSIS — E785 Hyperlipidemia, unspecified: Secondary | ICD-10-CM

## 2020-12-12 LAB — CBC WITH DIFFERENTIAL/PLATELET
Basophils Absolute: 0 10*3/uL (ref 0.0–0.1)
Basophils Relative: 1 % (ref 0.0–3.0)
Eosinophils Absolute: 0 10*3/uL (ref 0.0–0.7)
Eosinophils Relative: 0.9 % (ref 0.0–5.0)
HCT: 40.3 % (ref 36.0–46.0)
Hemoglobin: 14 g/dL (ref 12.0–15.0)
Lymphocytes Relative: 37 % (ref 12.0–46.0)
Lymphs Abs: 1.6 10*3/uL (ref 0.7–4.0)
MCHC: 34.7 g/dL (ref 30.0–36.0)
MCV: 90.9 fl (ref 78.0–100.0)
Monocytes Absolute: 0.4 10*3/uL (ref 0.1–1.0)
Monocytes Relative: 8.6 % (ref 3.0–12.0)
Neutro Abs: 2.3 10*3/uL (ref 1.4–7.7)
Neutrophils Relative %: 52.5 % (ref 43.0–77.0)
Platelets: 173 10*3/uL (ref 150.0–400.0)
RBC: 4.43 Mil/uL (ref 3.87–5.11)
RDW: 13 % (ref 11.5–15.5)
WBC: 4.4 10*3/uL (ref 4.0–10.5)

## 2020-12-12 LAB — COMPREHENSIVE METABOLIC PANEL
ALT: 19 U/L (ref 0–35)
AST: 29 U/L (ref 0–37)
Albumin: 4.1 g/dL (ref 3.5–5.2)
Alkaline Phosphatase: 55 U/L (ref 39–117)
BUN: 10 mg/dL (ref 6–23)
CO2: 31 mEq/L (ref 19–32)
Calcium: 9.4 mg/dL (ref 8.4–10.5)
Chloride: 105 mEq/L (ref 96–112)
Creatinine, Ser: 0.75 mg/dL (ref 0.40–1.20)
GFR: 72.68 mL/min (ref >60.00)
Glucose, Bld: 85 mg/dL (ref 70–99)
Potassium: 4.6 mEq/L (ref 3.5–5.1)
Sodium: 140 mEq/L (ref 135–145)
Total Bilirubin: 0.8 mg/dL (ref 0.2–1.2)
Total Protein: 6.9 g/dL (ref 6.0–8.3)

## 2020-12-12 LAB — T4, FREE: Free T4: 1.01 ng/dL (ref 0.60–1.60)

## 2020-12-12 LAB — TSH: TSH: 1.43 u[IU]/mL (ref 0.35–4.50)

## 2020-12-12 NOTE — Patient Instructions (Signed)

## 2020-12-12 NOTE — Progress Notes (Signed)
Subjective:  Patient ID: Alyssa Luna, female    DOB: Jul 17, 1934  Age: 87 y.o. MRN: [id number]  CC: Fatigue (Pt states she also been having some pain on (L) side, and when she eat sometimes end up with diarrhea ? diverticulitis)   HPI Alyssa Luna presents for weakness, fatigue, L side pain - LLQ Pt is taking Pepto- bismol to feel better. C/o LLQ pain x 1 mo  Outpatient Medications Prior to Visit  Medication Sig Dispense Refill  . acetaminophen (TYLENOL) 325 MG tablet Take by mouth.    [name] alum & mag hydroxide-simeth (MAALOX/MYLANTA) 200-200-20 MG/5ML suspension Take by mouth.    . B Complex-C (B-COMPLEX WITH VITAMIN C) tablet Take by mouth.    . bismuth subsalicylate (PEPTO BISMOL) 262 MG/15ML suspension Take 30 mLs by mouth every 6 (six) hours as needed.    . busPIRone (BUSPAR) 7.5 MG tablet Take 1 tablet (7.5 mg total) by mouth 2 (two) times daily. 60 tablet 5  . cetirizine (ZYRTEC) 10 MG tablet Take 10 mg by mouth daily as needed for allergies.    . Cholecalciferol 50 MCG (2000 UT) TBDP Take by mouth daily.    . Cyanocobalamin (VITAMIN B-12 IJ) Inject as directed every 30 (thirty) days.    [name] denosumab (PROLIA) 60 MG/ML SOLN injection Inject 60 mg into the skin every 6 (six) months. Administer in upper arm, thigh, or abdomen Unable to tolerate oral meds Dx severe osteoporosis 1.8 mL 6  . diphenhydrAMINE HCl (BENADRYL ALLERGY CHILDRENS PO) Take by mouth at bedtime as needed. Takes at night when itching    . famotidine (PEPCID) 20 MG tablet TAKE ONE TABLET BY MOUTH TWICE A DAY 180 tablet 3  . gabapentin (NEURONTIN) 600 MG tablet TAKE 1/2 TABLET BY MOUTH 4 TIMES A DAY    . levothyroxine (SYNTHROID) 25 MCG tablet TAKE ONE TABLET BY MOUTH DAILY BEFORE BREAKFAST 90 tablet 3  . LORazepam (ATIVAN) 0.5 MG tablet Take 1/2-1 tablet daily as needed for anxiety and sleep 60 tablet 3  . montelukast (SINGULAIR) 10 MG tablet TAKE ONE TABLET BY MOUTH DAILY 90 tablet 1  . Rivaroxaban  (XARELTO) 15 MG TABS tablet Take 15 mg by mouth daily.     . Simethicone (GAS-X PO) Take 1 tablet by mouth daily as needed (flatulence).      Facility-Administered Medications Prior to Visit  Medication Dose Route Frequency Provider Last Rate Last Admin  . cyanocobalamin ((VITAMIN B-12)) injection 1,000 mcg  1,000 mcg Intramuscular Q30 days Raychel Dowler, Evie Lacks, MD   1,000 mcg at 12/02/20 1426    ROS: Review of Systems  Constitutional: Positive for fatigue. Negative for activity change, appetite change, chills and unexpected weight change.  HENT: Negative for congestion, mouth sores and sinus pressure.   Eyes: Negative for visual disturbance.  Respiratory: Negative for cough and chest tightness.   Gastrointestinal: Negative for abdominal pain and nausea.  Genitourinary: Negative for difficulty urinating, frequency and vaginal pain.  Musculoskeletal: Negative for back pain and gait problem.  Skin: Negative for pallor and rash.  Neurological: Negative for dizziness, tremors, weakness, numbness and headaches.  Psychiatric/Behavioral: Negative for confusion and sleep disturbance. The patient is not nervous/anxious.     Objective:  BP 130/82 (BP Location: Left Arm)   Pulse 60   Temp 98 F (36.7 C) (Oral)   Ht 5' 2"  (1.575 m)   Wt 128 lb 6.4 oz (58.2 kg)   SpO2 96%   BMI 23.48 kg/m  BP Readings from Last 3 Encounters:  12/12/20 130/82  10/23/20 128/70  09/12/20 121/77    Wt Readings from Last 3 Encounters:  12/12/20 128 lb 6.4 oz (58.2 kg)  10/23/20 126 lb 6.4 oz (57.3 kg)  09/12/20 125 lb (56.7 kg)    Physical Exam Constitutional:      General: She is not in acute distress.    Appearance: She is well-developed.  HENT:     Head: Normocephalic.     Right Ear: External ear normal.     Left Ear: External ear normal.     Nose: Nose normal.  Eyes:     General:        Right eye: No discharge.        Left eye: No discharge.     Conjunctiva/sclera: Conjunctivae normal.      Pupils: Pupils are equal, round, and reactive to light.  Neck:     Thyroid: No thyromegaly.     Vascular: No JVD.     Trachea: No tracheal deviation.  Cardiovascular:     Rate and Rhythm: Normal rate and regular rhythm.     Heart sounds: Normal heart sounds.  Pulmonary:     Effort: No respiratory distress.     Breath sounds: No stridor. No wheezing.  Abdominal:     General: Bowel sounds are normal. There is no distension.     Palpations: Abdomen is soft. There is no mass.     Tenderness: There is abdominal tenderness. There is no guarding or rebound.  Musculoskeletal:        General: No tenderness.     Cervical back: Normal range of motion and neck supple.  Lymphadenopathy:     Cervical: No cervical adenopathy.  Skin:    Findings: No erythema or rash.  Neurological:     Cranial Nerves: No cranial nerve deficit.     Motor: No abnormal muscle tone.     Coordination: Coordination normal.     Deep Tendon Reflexes: Reflexes normal.  Psychiatric:        Behavior: Behavior normal.        Thought Content: Thought content normal.        Judgment: Judgment normal.   LLQ w/pain  Lab Results  Component Value Date   WBC 4.3 07/25/2020   HGB 13.9 07/25/2020   HCT 40.8 07/25/2020   PLT 190.0 07/25/2020   GLUCOSE 83 07/25/2020   CHOL 160 02/25/2018   TRIG 119.0 02/25/2018   HDL 42.90 02/25/2018   LDLDIRECT 178.0 09/24/2011   LDLCALC 94 02/25/2018   ALT 16 07/25/2020   AST 25 07/25/2020   NA 141 07/25/2020   K 3.9 07/25/2020   CL 104 07/25/2020   CREATININE 0.63 07/25/2020   BUN 8 07/25/2020   CO2 32 07/25/2020   TSH 1.70 07/25/2020   INR 1.3 (H) 02/07/2019    No results found.  Assessment & Plan:    Walker Kehr, MD

## 2020-12-24 ENCOUNTER — Other Ambulatory Visit: Payer: Self-pay | Admitting: Internal Medicine

## 2021-01-01 ENCOUNTER — Other Ambulatory Visit: Payer: Self-pay

## 2021-01-01 ENCOUNTER — Ambulatory Visit: Payer: Medicare PPO

## 2021-01-01 ENCOUNTER — Ambulatory Visit (INDEPENDENT_AMBULATORY_CARE_PROVIDER_SITE_OTHER): Payer: Medicare PPO

## 2021-01-01 DIAGNOSIS — E538 Deficiency of other specified B group vitamins: Secondary | ICD-10-CM | POA: Diagnosis not present

## 2021-01-01 DIAGNOSIS — Z91014 Allergy to mammalian meats: Secondary | ICD-10-CM | POA: Diagnosis not present

## 2021-01-01 DIAGNOSIS — Z7185 Encounter for immunization safety counseling: Secondary | ICD-10-CM | POA: Diagnosis not present

## 2021-01-01 NOTE — Progress Notes (Addendum)
B12 given  Medical screening examination/treatment/procedure(s) were performed by non-physician practitioner and as supervising physician I was immediately available for consultation/collaboration.  I agree with above. Lew Dawes, MD

## 2021-01-10 ENCOUNTER — Ambulatory Visit: Payer: Medicare PPO | Admitting: Internal Medicine

## 2021-01-14 ENCOUNTER — Ambulatory Visit: Payer: Medicare PPO | Admitting: Podiatry

## 2021-01-14 ENCOUNTER — Other Ambulatory Visit: Payer: Self-pay

## 2021-01-14 ENCOUNTER — Encounter: Payer: Self-pay | Admitting: Podiatry

## 2021-01-14 DIAGNOSIS — B351 Tinea unguium: Secondary | ICD-10-CM | POA: Diagnosis not present

## 2021-01-14 DIAGNOSIS — L600 Ingrowing nail: Secondary | ICD-10-CM

## 2021-01-14 DIAGNOSIS — M79609 Pain in unspecified limb: Secondary | ICD-10-CM

## 2021-01-14 NOTE — Progress Notes (Signed)
Subjective:  Patient ID: Alyssa Luna, female    DOB: 09/08/1933,  MRN: [id number]  87 y.o. female presents painful thick toenails that are difficult to trim. Pain interferes with ambulation. Aggravating factors include wearing enclosed shoe gear. Pain is relieved with periodic professional debridement.   She relates tender right great toe on today's visit. Denies any drainage, redness or swelling, but digit is tender.   Allergies  Allergen Reactions  . Alpha-Gal Itching  . Beef (Bovine) Protein Other (See Comments)    Alpha-gal allergy testing positive June 2018  . Galactose Nausea Only    Per patient, cannot have alpha-gal since tic bite episode.  . Pravastatin Anaphylaxis and Other (See Comments)    Legs ache  . Alpha-D-Galactosidase Itching  . Amlodipine     Leg swelling  . Amlodipine-Atorvastatin     swelling  . Cefdinir     Nausea from suspension  . Colchicine Other (See Comments)  . Doxycycline Other (See Comments)    Nausea - able to take w/food  . Shellfish Allergy Other (See Comments)    Unknown  . Tikosyn [Dofetilide] Other (See Comments)    Unknown reaction and was told never to take it due to problems during sleep   . Actonel [Risedronate Sodium] Other (See Comments)    Not known  . Alendronate Other (See Comments)    NOT KNOWN  . Augmentin [Amoxicillin-Pot Clavulanate] Rash    Has patient had a PCN reaction causing immediate rash, facial/tongue/throat swelling, SOB or lightheadedness with hypotension: Yes Has patient had a PCN reaction causing severe rash involving mucus membranes or skin necrosis:No Has patient had a PCN reaction that required hospitalization: No Has patient had a PCN reaction occurring within the last 10 years: No If all of the above answers are "NO", then may proceed with Cephalosporin use.   [name] Beef Allergy Rash  . Cheese Rash  . Ciprofloxacin Nausea Only  . Evista [Raloxifene] Other (See Comments)    Unknown reaction  . Flagyl  [Metronidazole] Other (See Comments)    Not known  . Fosamax [Alendronate Sodium] Other (See Comments)    NOT KNOWN  . Gold-Containing Drug Products Other (See Comments)    Per Dr  . Loma Sousa [Escitalopram Oxalate] Other (See Comments)    shaky  . Other Rash  . Pork Allergy Rash  . Risedronate Other (See Comments)    Unknown reaction  . Simvastatin Other (See Comments)    REACTION: leg cramps, weakness  . Tramadol Other (See Comments)    Mental disturbance changes    Review of Systems: Negative except as noted in the HPI.   Objective:   Constitutional Pt is a pleasant 87 y.o. Caucasian female WD, WN in NAD. AAO x 3.   Vascular Capillary refill time to digits immediate b/l. Palpable pedal pulses b/l LE. Pedal hair sparse. Lower extremity skin temperature gradient within normal limits. No pain with calf compression b/l.  Neurologic Protective sensation intact 5/5 intact bilaterally with 10g monofilament b/l.  Dermatologic Pedal skin with normal turgor, texture and tone bilaterally. No open wounds bilaterally. No interdigital macerations bilaterally. Toenails 1-5 b/l elongated, discolored, dystrophic, thickened, crumbly with subungual debris and tenderness to dorsal palpation. Incurvated nailplate lateral border(s) R hallux.  Nail border hypertrophy minimal. There is tenderness to palpation. Sign(s) of infection: no clinical signs of infection noted on examination today.. No hyperkeratotic nor porokeratotic lesions present on today's visit.  Orthopedic: Normal muscle strength 5/5 to all lower extremity muscle groups  bilaterally. No pain crepitus or joint limitation noted with ROM b/l. Hammertoe(s) noted to the R 5th toe.   Radiographs: None Assessment:   1. Pain due to onychomycosis of nail   2. Ingrown toenail without infection    Plan:  Patient was evaluated and treated and all questions answered.  Onychomycosis with pain -Nails palliatively debridement as below. -Educated on  self-care  Procedure: Nail Debridement Rationale: Pain Type of Debridement: manual, sharp debridement. Instrumentation: Nail nipper, rotary burr. Number of Nails: 10  -Examined patient. -Patient to continue soft, supportive shoe gear daily. -Toenails 1-5 b/l were debrided in length and girth with sterile nail nippers and dremel without iatrogenic bleeding.  -Offending nail border debrided and curretaged R hallux utilizing sterile nail nipper and currette. Border(s) cleansed with alcohol and triple antibiotic ointment applied. Patient instructed to apply Neosporin to R hallux once daily for 7 days. -Patient to report any pedal injuries to medical professional immediately. -Patient/POA to call should there be question/concern in the interim.  Return in about 3 months (around 04/16/2021).  Marzetta Board, DPM

## 2021-01-17 ENCOUNTER — Encounter: Payer: Self-pay | Admitting: Internal Medicine

## 2021-01-17 ENCOUNTER — Other Ambulatory Visit: Payer: Self-pay

## 2021-01-17 ENCOUNTER — Ambulatory Visit: Payer: Medicare PPO | Admitting: Internal Medicine

## 2021-01-17 VITALS — BP 126/86 | HR 55 | Temp 97.7°F | Ht 62.0 in | Wt 128.0 lb

## 2021-01-17 DIAGNOSIS — R110 Nausea: Secondary | ICD-10-CM | POA: Diagnosis not present

## 2021-01-17 DIAGNOSIS — K219 Gastro-esophageal reflux disease without esophagitis: Secondary | ICD-10-CM | POA: Diagnosis not present

## 2021-01-17 DIAGNOSIS — E538 Deficiency of other specified B group vitamins: Secondary | ICD-10-CM

## 2021-01-17 DIAGNOSIS — E559 Vitamin D deficiency, unspecified: Secondary | ICD-10-CM

## 2021-01-17 DIAGNOSIS — I10 Essential (primary) hypertension: Secondary | ICD-10-CM

## 2021-01-17 DIAGNOSIS — T7840XS Allergy, unspecified, sequela: Secondary | ICD-10-CM | POA: Diagnosis not present

## 2021-01-17 DIAGNOSIS — Z91018 Allergy to other foods: Secondary | ICD-10-CM

## 2021-01-17 DIAGNOSIS — F413 Other mixed anxiety disorders: Secondary | ICD-10-CM

## 2021-01-17 LAB — COMPREHENSIVE METABOLIC PANEL
ALT: 19 U/L (ref 0–35)
AST: 28 U/L (ref 0–37)
Albumin: 4.2 g/dL (ref 3.5–5.2)
Alkaline Phosphatase: 63 U/L (ref 39–117)
BUN: 11 mg/dL (ref 6–23)
CO2: 30 mEq/L (ref 19–32)
Calcium: 9.4 mg/dL (ref 8.4–10.5)
Chloride: 104 mEq/L (ref 96–112)
Creatinine, Ser: 0.74 mg/dL (ref 0.40–1.20)
GFR: 73.81 mL/min (ref >60.00)
Glucose, Bld: 80 mg/dL (ref 70–99)
Potassium: 4.2 mEq/L (ref 3.5–5.1)
Sodium: 140 mEq/L (ref 135–145)
Total Bilirubin: 0.9 mg/dL (ref 0.2–1.2)
Total Protein: 6.9 g/dL (ref 6.0–8.3)

## 2021-01-17 LAB — CBC WITH DIFFERENTIAL/PLATELET
Basophils Absolute: 0.1 10*3/uL (ref 0.0–0.1)
Basophils Relative: 1.3 % (ref 0.0–3.0)
Eosinophils Absolute: 0.1 10*3/uL (ref 0.0–0.7)
Eosinophils Relative: 1.7 % (ref 0.0–5.0)
HCT: 40.8 % (ref 36.0–46.0)
Hemoglobin: 14.1 g/dL (ref 12.0–15.0)
Lymphocytes Relative: 49.3 % — ABNORMAL HIGH (ref 12.0–46.0)
Lymphs Abs: 1.9 10*3/uL (ref 0.7–4.0)
MCHC: 34.5 g/dL (ref 30.0–36.0)
MCV: 91.5 fl (ref 78.0–100.0)
Monocytes Absolute: 0.4 10*3/uL (ref 0.1–1.0)
Monocytes Relative: 8.9 % (ref 3.0–12.0)
Neutro Abs: 1.5 10*3/uL (ref 1.4–7.7)
Neutrophils Relative %: 38.8 % — ABNORMAL LOW (ref 43.0–77.0)
Platelets: 181 10*3/uL (ref 150.0–400.0)
RBC: 4.46 Mil/uL (ref 3.87–5.11)
RDW: 12.9 % (ref 11.5–15.5)
WBC: 4 10*3/uL (ref 4.0–10.5)

## 2021-01-17 LAB — LIPASE: Lipase: 54 U/L (ref 11.0–59.0)

## 2021-01-17 NOTE — Assessment & Plan Note (Signed)
On B12 inj

## 2021-01-17 NOTE — Assessment & Plan Note (Signed)
On Vit D 

## 2021-01-17 NOTE — Assessment & Plan Note (Signed)
Cont w/avoidance diet. Titers are better

## 2021-01-17 NOTE — Progress Notes (Signed)
Subjective:  Patient ID: Alyssa Luna, female    DOB: 04-08-1934  Age: 87 y.o. MRN: [id number]  CC: Abdominal Pain (Pt states she feels nausea and upset stomach for about 3 weeks. Pt takes pepto bismol to help)   HPI DIMPLE BASTYR presents for B12 inj, allergies, GERD C/o nausea at times x 3 weeks - Pepto-bismol is helping - better  Outpatient Medications Prior to Visit  Medication Sig Dispense Refill  . B Complex-C (B-COMPLEX WITH VITAMIN C) tablet Take by mouth.    . bismuth subsalicylate (PEPTO BISMOL) 262 MG/15ML suspension Take 30 mLs by mouth every 6 (six) hours as needed.    . cetirizine (ZYRTEC) 10 MG tablet Take 10 mg by mouth daily as needed for allergies.    . Cholecalciferol 50 MCG (2000 UT) TBDP Take by mouth daily.    . Cyanocobalamin (VITAMIN B-12 IJ) Inject as directed every 30 (thirty) days.    [name] denosumab (PROLIA) 60 MG/ML SOLN injection Inject 60 mg into the skin every 6 (six) months. Administer in upper arm, thigh, or abdomen Unable to tolerate oral meds Dx severe osteoporosis 1.8 mL 6  . diphenhydrAMINE HCl (BENADRYL ALLERGY CHILDRENS PO) Take by mouth at bedtime as needed. Takes at night when itching    . famotidine (PEPCID) 20 MG tablet TAKE ONE TABLET BY MOUTH TWICE A DAY 180 tablet 3  . gabapentin (NEURONTIN) 600 MG tablet TAKE 1/2 TABLET BY MOUTH 4 TIMES A DAY    . levothyroxine (SYNTHROID) 25 MCG tablet TAKE ONE TABLET BY MOUTH DAILY BEFORE BREAKFAST 90 tablet 3  . LORazepam (ATIVAN) 0.5 MG tablet Take 1/2-1 tablet daily as needed for anxiety and sleep 60 tablet 3  . montelukast (SINGULAIR) 10 MG tablet TAKE ONE TABLET BY MOUTH DAILY 90 tablet 1  . Rivaroxaban (XARELTO) 15 MG TABS tablet Take 15 mg by mouth daily.     . Simethicone (GAS-X PO) Take 1 tablet by mouth daily as needed (flatulence).     [name] acetaminophen (TYLENOL) 325 MG tablet Take by mouth. (Patient not taking: Reported on 01/17/2021)    . alum & mag hydroxide-simeth (MAALOX/MYLANTA)  200-200-20 MG/5ML suspension Take by mouth. (Patient not taking: Reported on 01/17/2021)    . busPIRone (BUSPAR) 7.5 MG tablet Take 1 tablet (7.5 mg total) by mouth 2 (two) times daily. (Patient not taking: Reported on 01/17/2021) 60 tablet 5   Facility-Administered Medications Prior to Visit  Medication Dose Route Frequency Provider Last Rate Last Admin  . cyanocobalamin ((VITAMIN B-12)) injection 1,000 mcg  1,000 mcg Intramuscular Q30 days Shavana Calder, Evie Lacks, MD   1,000 mcg at 01/01/21 1515    ROS: Review of Systems  Constitutional: Negative for activity change, appetite change, chills, fatigue and unexpected weight change.  HENT: Negative for congestion, mouth sores and sinus pressure.   Eyes: Negative for visual disturbance.  Respiratory: Negative for cough and chest tightness.   Gastrointestinal: Negative for abdominal pain and nausea.  Genitourinary: Negative for difficulty urinating, frequency and vaginal pain.  Musculoskeletal: Negative for back pain and gait problem.  Skin: Negative for pallor and rash.  Neurological: Negative for dizziness, tremors, weakness, numbness and headaches.  Psychiatric/Behavioral: Negative for confusion and sleep disturbance.    Objective:  BP 126/86   Pulse (!) 55   Temp 97.7 F (36.5 C) (Oral)   Ht 5' 2"  (1.575 m)   Wt 128 lb (58.1 kg)   SpO2 96%   BMI 23.41 kg/m   BP Readings  from Last 3 Encounters:  01/17/21 126/86  12/12/20 130/82  10/23/20 128/70    Wt Readings from Last 3 Encounters:  01/17/21 128 lb (58.1 kg)  12/12/20 128 lb 6.4 oz (58.2 kg)  10/23/20 126 lb 6.4 oz (57.3 kg)    Physical Exam Constitutional:      General: She is not in acute distress.    Appearance: She is well-developed.  HENT:     Head: Normocephalic.     Right Ear: External ear normal.     Left Ear: External ear normal.     Nose: Nose normal.  Eyes:     General:        Right eye: No discharge.        Left eye: No discharge.     Conjunctiva/sclera:  Conjunctivae normal.     Pupils: Pupils are equal, round, and reactive to light.  Neck:     Thyroid: No thyromegaly.     Vascular: No JVD.     Trachea: No tracheal deviation.  Cardiovascular:     Rate and Rhythm: Normal rate and regular rhythm.     Heart sounds: Normal heart sounds.  Pulmonary:     Effort: No respiratory distress.     Breath sounds: No stridor. No wheezing.  Abdominal:     General: Bowel sounds are normal. There is no distension.     Palpations: Abdomen is soft. There is no mass.     Tenderness: There is no abdominal tenderness. There is no guarding or rebound.  Genitourinary:    Adnexa:        Left: No mass.    Musculoskeletal:        General: No tenderness.     Cervical back: Normal range of motion and neck supple.  Lymphadenopathy:     Cervical: No cervical adenopathy.  Skin:    Findings: No erythema or rash.  Neurological:     Cranial Nerves: No cranial nerve deficit.     Motor: No abnormal muscle tone.     Coordination: Coordination normal.     Deep Tendon Reflexes: Reflexes normal.  Psychiatric:        Behavior: Behavior normal.        Thought Content: Thought content normal.        Judgment: Judgment normal.     Lab Results  Component Value Date   WBC 4.4 12/12/2020   HGB 14.0 12/12/2020   HCT 40.3 12/12/2020   PLT 173.0 12/12/2020   GLUCOSE 85 12/12/2020   CHOL 160 02/25/2018   TRIG 119.0 02/25/2018   HDL 42.90 02/25/2018   LDLDIRECT 178.0 09/24/2011   LDLCALC 94 02/25/2018   ALT 19 12/12/2020   AST 29 12/12/2020   NA 140 12/12/2020   K 4.6 12/12/2020   CL 105 12/12/2020   CREATININE 0.75 12/12/2020   BUN 10 12/12/2020   CO2 31 12/12/2020   TSH 1.43 12/12/2020   INR 1.3 (H) 02/07/2019    No results found.  Assessment & Plan:    Walker Kehr, MD

## 2021-01-17 NOTE — Assessment & Plan Note (Signed)
Pending oral surgery was discussed

## 2021-01-17 NOTE — Assessment & Plan Note (Signed)
Off BP meds NAS diet

## 2021-01-17 NOTE — Assessment & Plan Note (Addendum)
C/o nausea at times - Pepto-bismol is helping On Pepcid daily

## 2021-01-23 ENCOUNTER — Ambulatory Visit: Payer: Medicare PPO | Admitting: Internal Medicine

## 2021-02-12 ENCOUNTER — Ambulatory Visit: Payer: Medicare PPO

## 2021-02-12 ENCOUNTER — Other Ambulatory Visit: Payer: Self-pay

## 2021-02-12 ENCOUNTER — Ambulatory Visit (INDEPENDENT_AMBULATORY_CARE_PROVIDER_SITE_OTHER): Payer: Medicare PPO

## 2021-02-12 DIAGNOSIS — E538 Deficiency of other specified B group vitamins: Secondary | ICD-10-CM | POA: Diagnosis not present

## 2021-02-12 NOTE — Progress Notes (Addendum)
Pt here for monthly B12 injection per Dr Alain Marion.  B12 1069mg given IM left deltoid and pt tolerated injection well.  Next B12 injection scheduled for 03/14/21.  Medical screening examination/treatment/procedure(s) were performed by non-physician practitioner and as supervising physician I was immediately available for consultation/collaboration.  I agree with above. ALew Dawes MD

## 2021-02-18 ENCOUNTER — Telehealth: Payer: Self-pay | Admitting: Internal Medicine

## 2021-02-18 NOTE — Telephone Encounter (Signed)
   Patient calling to report she is COVID+, 7/5 result Cough, leg pain,congestion  Seeking advice

## 2021-02-20 ENCOUNTER — Encounter: Payer: Self-pay | Admitting: Internal Medicine

## 2021-02-20 ENCOUNTER — Telehealth: Payer: Medicare PPO | Admitting: Internal Medicine

## 2021-02-20 DIAGNOSIS — U071 COVID-19: Secondary | ICD-10-CM | POA: Insufficient documentation

## 2021-02-20 MED ORDER — PAXLOVID 20 X 150 MG & 10 X 100MG PO TBPK
3.0000 | ORAL_TABLET | Freq: Two times a day (BID) | ORAL | 0 refills | Status: DC
Start: 2021-02-20 — End: 2021-03-10

## 2021-02-20 MED ORDER — METHYLPREDNISOLONE 4 MG PO TBPK: ORAL_TABLET | ORAL | 0 refills | Status: DC

## 2021-02-20 MED ORDER — AZITHROMYCIN 250 MG PO TABS: ORAL_TABLET | ORAL | 0 refills | Status: DC

## 2021-02-20 NOTE — Telephone Encounter (Signed)
If she is better over the same:  For a mild COVID-19 case - take zinc 50 mg a day for 1 week, vitamin C 1000 mg daily for 1 week, vitamin D2 50,000 units weekly for 2 months (unless  taking vitamin D daily already). Take Allegra or Benadryl.  Maintain good oral hydration and take Tylenol for high fever.  Call if problems. Isolate for 5 days, then wear a mask for 5 days per CDC.  If worse:  We can send her prescription for Paxlovid today.  Let me know. Thanks

## 2021-02-20 NOTE — Progress Notes (Signed)
Virtual Visit via Telephone Note  I connected with Alyssa Luna on 02/20/21 at  2:20 PM EDT by telephone and verified that I am speaking with the correct person using two identifiers.  Location: Patient: home Provider: [facility] office No one else is present in the phone call   I discussed the limitations, risks, security and privacy concerns of performing an evaluation and management service by telephone and the availability of in person appointments. I also discussed with the patient that there may be a patient responsible charge related to this service. The patient expressed understanding and agreed to proceed.   History of Present Illness: The patient is complaining of being sick with cold symptoms since Monday this week.  On Tuesday she tested positive for COVID.  She was having mild symptoms.  However, starting late this morning she is worse with fatigue, myalgias, cough with yellow sputum production.  She is denying shortness of breath.  There is no nausea, vomiting, diarrhea.  She did sleep last night.   Observations/Objective:  The patient sounds tired on the phone. Assessment and Plan:  See plan Follow Up Instructions:    I discussed the assessment and treatment plan with the patient. The patient was provided an opportunity to ask questions and all were answered. The patient agreed with the plan and demonstrated an understanding of the instructions.   The patient was advised to call back or seek an in-person evaluation if the symptoms worsen or if the condition fails to improve as anticipated.  I provided 21 minutes of non-face-to-face time during this encounter.   Walker Kehr, MD

## 2021-02-20 NOTE — Assessment & Plan Note (Signed)
The patient is complaining of being sick with cold symptoms since Monday this week.  On Tuesday she tested positive for COVID.  She was having mild symptoms.  However, starting late this morning she is worse with fatigue, myalgias, cough with yellow sputum production.  She is denying shortness of breath.  There is no nausea, vomiting, diarrhea.  She did sleep last night. Go to ER if worse. Start on Paxlovid for 5 days.  Will use a Z-Pak for probable bronchitis complicating COVID.  Will start on Medrol Dosepak

## 2021-02-20 NOTE — Telephone Encounter (Signed)
Called pt there was no answer LMOM w/MD response.Marland KitchenJohny Chess

## 2021-02-27 NOTE — Telephone Encounter (Signed)
No. It may take weeks to get better. Thx

## 2021-02-27 NOTE — Telephone Encounter (Signed)
    Patient called and was wondering if there was anything else that she needed to take for Covid 19. She said that she tested positive on 7/5. Please advise

## 2021-02-28 NOTE — Telephone Encounter (Signed)
Called pt verified if she picked took all the medication that was rx on 7/7. Pt states she took everything except for the Paxlovid. She states she did not know rx was sent. She states she doesn't really have any other symptom but the nasal congestion, and her legs still ache, but she states she had achey bones before covid. She states since she lives in a independent living facility they checked her yesterday and the test still was positive. Inform pt she can call pharmacy to get the rx that was sent for Paxlovid if she like or continue taking her vitamins as md recommended and let it run it course. Pt states she will see how she feel today, may get someone to go pick rx up. Inform pt to make sure she's drinking plenty of fluids.Marland KitchenJohny Chess

## 2021-03-10 ENCOUNTER — Ambulatory Visit: Payer: Medicare PPO | Admitting: Internal Medicine

## 2021-03-10 ENCOUNTER — Ambulatory Visit: Payer: Medicare PPO

## 2021-03-10 ENCOUNTER — Encounter: Payer: Self-pay | Admitting: Internal Medicine

## 2021-03-10 ENCOUNTER — Other Ambulatory Visit: Payer: Self-pay

## 2021-03-10 DIAGNOSIS — Z8679 Personal history of other diseases of the circulatory system: Secondary | ICD-10-CM

## 2021-03-10 DIAGNOSIS — Z8673 Personal history of transient ischemic attack (TIA), and cerebral infarction without residual deficits: Secondary | ICD-10-CM | POA: Diagnosis not present

## 2021-03-10 DIAGNOSIS — R5382 Chronic fatigue, unspecified: Secondary | ICD-10-CM | POA: Diagnosis not present

## 2021-03-10 DIAGNOSIS — E538 Deficiency of other specified B group vitamins: Secondary | ICD-10-CM | POA: Diagnosis not present

## 2021-03-10 DIAGNOSIS — K029 Dental caries, unspecified: Secondary | ICD-10-CM

## 2021-03-10 DIAGNOSIS — M810 Age-related osteoporosis without current pathological fracture: Secondary | ICD-10-CM | POA: Diagnosis not present

## 2021-03-10 DIAGNOSIS — U071 COVID-19: Secondary | ICD-10-CM | POA: Diagnosis not present

## 2021-03-10 DIAGNOSIS — I4891 Unspecified atrial fibrillation: Secondary | ICD-10-CM | POA: Diagnosis not present

## 2021-03-10 DIAGNOSIS — G459 Transient cerebral ischemic attack, unspecified: Secondary | ICD-10-CM | POA: Diagnosis not present

## 2021-03-10 MED ORDER — CYANOCOBALAMIN 1000 MCG/ML IJ SOLN
1000.0000 ug | Freq: Once | INTRAMUSCULAR | Status: AC
  Administered 2021-03-10: 1000 ug via INTRAMUSCULAR

## 2021-03-10 NOTE — Assessment & Plan Note (Addendum)
No relapse.  Continue on Xarelto

## 2021-03-10 NOTE — Assessment & Plan Note (Signed)
On B12 

## 2021-03-10 NOTE — Assessment & Plan Note (Signed)
Worse post - COVID Rest more

## 2021-03-10 NOTE — Progress Notes (Signed)
Subjective:  Patient ID: Alyssa Luna, female    DOB: 1934-05-06  Age: 87 y.o. MRN: [id number]  CC: No chief complaint on file.   HPI Alyssa Luna presents for COVID 19 early in July  Pharmacy did not dispese Paxlovid to the pt.  Pt took a Z-Pak for probable bronchitis complicating COVID and a Medrol Dosepak.  Pt is c/o teeth problems - will need dentures F/u on TIA, osteoporosis   Outpatient Medications Prior to Visit  Medication Sig Dispense Refill   acetaminophen (TYLENOL) 325 MG tablet Take by mouth. (Patient not taking: Reported on 01/17/2021)     alum & mag hydroxide-simeth (MAALOX/MYLANTA) 200-200-20 MG/5ML suspension Take by mouth. (Patient not taking: Reported on 01/17/2021)     azithromycin (ZITHROMAX Z-PAK) 250 MG tablet As directed 6 tablet 0   B Complex-C (B-COMPLEX WITH VITAMIN C) tablet Take by mouth.     bismuth subsalicylate (PEPTO BISMOL) 262 MG/15ML suspension Take 30 mLs by mouth every 6 (six) hours as needed.     busPIRone (BUSPAR) 7.5 MG tablet Take 1 tablet (7.5 mg total) by mouth 2 (two) times daily. (Patient not taking: Reported on 01/17/2021) 60 tablet 5   cetirizine (ZYRTEC) 10 MG tablet Take 10 mg by mouth daily as needed for allergies.     Cholecalciferol 50 MCG (2000 UT) TBDP Take by mouth daily.     Cyanocobalamin (VITAMIN B-12 IJ) Inject as directed every 30 (thirty) days.     denosumab (PROLIA) 60 MG/ML SOLN injection Inject 60 mg into the skin every 6 (six) months. Administer in upper arm, thigh, or abdomen Unable to tolerate oral meds Dx severe osteoporosis 1.8 mL 6   diphenhydrAMINE HCl (BENADRYL ALLERGY CHILDRENS PO) Take by mouth at bedtime as needed. Takes at night when itching     famotidine (PEPCID) 20 MG tablet TAKE ONE TABLET BY MOUTH TWICE A DAY 180 tablet 3   gabapentin (NEURONTIN) 600 MG tablet TAKE 1/2 TABLET BY MOUTH 4 TIMES A DAY     levothyroxine (SYNTHROID) 25 MCG tablet TAKE ONE TABLET BY MOUTH DAILY BEFORE BREAKFAST 90 tablet  3   LORazepam (ATIVAN) 0.5 MG tablet Take 1/2-1 tablet daily as needed for anxiety and sleep 60 tablet 3   methylPREDNISolone (MEDROL DOSEPAK) 4 MG TBPK tablet As directed 21 tablet 0   montelukast (SINGULAIR) 10 MG tablet TAKE ONE TABLET BY MOUTH DAILY 90 tablet 1   Nirmatrelvir & Ritonavir (PAXLOVID) 20 x 150 MG & 10 x 100MG TBPK Take 3 tablets by mouth in the morning and at bedtime. As directed on a package. I'm aware of her allergies, drug interactions with Xarelto 30 tablet 0   Rivaroxaban (XARELTO) 15 MG TABS tablet Take 15 mg by mouth daily.      Simethicone (GAS-X PO) Take 1 tablet by mouth daily as needed (flatulence).      Facility-Administered Medications Prior to Visit  Medication Dose Route Frequency Provider Last Rate Last Admin   cyanocobalamin ((VITAMIN B-12)) injection 1,000 mcg  1,000 mcg Intramuscular Q30 days Nayeli Calvert, Evie Lacks, MD   1,000 mcg at 02/12/21 1510    ROS: Review of Systems  Constitutional:  Positive for fatigue. Negative for activity change, appetite change, chills and unexpected weight change.  HENT:  Negative for congestion, mouth sores and sinus pressure.   Eyes:  Negative for visual disturbance.  Respiratory:  Negative for cough and chest tightness.   Gastrointestinal:  Negative for abdominal pain and nausea.  Genitourinary:  Negative for difficulty urinating, frequency and vaginal pain.  Musculoskeletal:  Positive for arthralgias and back pain. Negative for gait problem.  Skin:  Negative for pallor and rash.  Neurological:  Negative for dizziness, tremors, weakness, numbness and headaches.  Psychiatric/Behavioral:  Negative for confusion, sleep disturbance and suicidal ideas.    Objective:  There were no vitals taken for this visit.  BP Readings from Last 3 Encounters:  01/17/21 126/86  12/12/20 130/82  10/23/20 128/70    Wt Readings from Last 3 Encounters:  01/17/21 128 lb (58.1 kg)  12/12/20 128 lb 6.4 oz (58.2 kg)  10/23/20 126 lb 6.4  oz (57.3 kg)    Physical Exam Constitutional:      General: She is not in acute distress.    Appearance: She is well-developed.  HENT:     Head: Normocephalic.     Right Ear: External ear normal.     Left Ear: External ear normal.     Nose: Nose normal.  Eyes:     General:        Right eye: No discharge.        Left eye: No discharge.     Conjunctiva/sclera: Conjunctivae normal.     Pupils: Pupils are equal, round, and reactive to light.  Neck:     Thyroid: No thyromegaly.     Vascular: No JVD.     Trachea: No tracheal deviation.  Cardiovascular:     Rate and Rhythm: Normal rate and regular rhythm.     Heart sounds: Normal heart sounds.  Pulmonary:     Effort: No respiratory distress.     Breath sounds: No stridor. No wheezing.  Abdominal:     General: Bowel sounds are normal. There is no distension.     Palpations: Abdomen is soft. There is no mass.     Tenderness: There is no abdominal tenderness. There is no guarding or rebound.  Musculoskeletal:        General: No tenderness.     Cervical back: Normal range of motion and neck supple. No rigidity.  Lymphadenopathy:     Cervical: No cervical adenopathy.  Skin:    Findings: No erythema or rash.  Neurological:     Cranial Nerves: No cranial nerve deficit.     Motor: No abnormal muscle tone.     Coordination: Coordination normal.     Deep Tendon Reflexes: Reflexes normal.  Psychiatric:        Behavior: Behavior normal.        Thought Content: Thought content normal.        Judgment: Judgment normal.   Using a walker A/o/c  Lab Results  Component Value Date   WBC 4.0 01/17/2021   HGB 14.1 01/17/2021   HCT 40.8 01/17/2021   PLT 181.0 01/17/2021   GLUCOSE 80 01/17/2021   CHOL 160 02/25/2018   TRIG 119.0 02/25/2018   HDL 42.90 02/25/2018   LDLDIRECT 178.0 09/24/2011   LDLCALC 94 02/25/2018   ALT 19 01/17/2021   AST 28 01/17/2021   NA 140 01/17/2021   K 4.2 01/17/2021   CL 104 01/17/2021   CREATININE  0.74 01/17/2021   BUN 11 01/17/2021   CO2 30 01/17/2021   TSH 1.43 12/12/2020   INR 1.3 (H) 02/07/2019    No results found.  Assessment & Plan:    Walker Kehr, MD

## 2021-03-10 NOTE — Assessment & Plan Note (Signed)
Hold Prolia for upcoming big dental work Last Prolia 07/2020

## 2021-03-10 NOTE — Assessment & Plan Note (Signed)
Pharmacy did not dispese Paxlovid to the pt.  Pt took a Z-Pak for probable bronchitis complicating COVID and a Medrol Dosepak.

## 2021-03-10 NOTE — Assessment & Plan Note (Addendum)
[facility] will do teeth extraction/dentures On Xarelto, Prolia -- discussed timing

## 2021-03-10 NOTE — Assessment & Plan Note (Signed)
On Xarelto 

## 2021-03-13 ENCOUNTER — Ambulatory Visit: Payer: Medicare PPO | Admitting: Internal Medicine

## 2021-03-13 ENCOUNTER — Telehealth: Payer: Self-pay | Admitting: *Deleted

## 2021-03-13 NOTE — Telephone Encounter (Signed)
-----   Message from [place] sent at 03/11/2021 11:31 AM EDT ----- Surgery clearance

## 2021-03-13 NOTE — Telephone Encounter (Signed)
Faxed medical clearance form back to [facility].Marland KitchenJohny Chess

## 2021-03-14 ENCOUNTER — Ambulatory Visit: Payer: Medicare PPO

## 2021-03-25 ENCOUNTER — Other Ambulatory Visit: Payer: Self-pay | Admitting: Internal Medicine

## 2021-03-25 DIAGNOSIS — S62102A Fracture of unspecified carpal bone, left wrist, initial encounter for closed fracture: Secondary | ICD-10-CM | POA: Diagnosis not present

## 2021-03-25 NOTE — Assessment & Plan Note (Signed)
Continue with Xarelto

## 2021-03-26 DIAGNOSIS — M79642 Pain in left hand: Secondary | ICD-10-CM | POA: Diagnosis not present

## 2021-03-28 NOTE — Telephone Encounter (Signed)
[place] called   The questions listed on the top Questions 1-5 on clearance form was not filled out and they are required to give full clearance. Requesting it to be completed and faxed back. Patient is scheduled for a consult with the surgeon on 04/08/2021.   Please advise.

## 2021-03-31 NOTE — Telephone Encounter (Signed)
Refaxed with questions completed.Marland KitchenJohny Chess

## 2021-04-02 ENCOUNTER — Telehealth: Payer: Self-pay | Admitting: Lab

## 2021-04-02 NOTE — Chronic Care Management (AMB) (Signed)
  Chronic Care Management   Note  04/02/2021 Name: Alyssa Luna MRN: [id number] DOB: [birth year]  Alyssa Luna is a 87 y.o. year old female who is a primary care patient of Plotnikov, Evie Lacks, MD. I reached out to Karle Starch by phone today in response to a referral sent by Ms. Javier Glazier Rosales's PCP, Plotnikov, Evie Lacks, MD.   Ms. Trouten was given information about Chronic Care Management services today including:  CCM service includes personalized support from designated clinical staff supervised by her physician, including individualized plan of care and coordination with other care providers 24/7 contact phone numbers for assistance for urgent and routine care needs. Service will only be billed when office clinical staff spend 20 minutes or more in a month to coordinate care. Only one practitioner may furnish and bill the service in a calendar month. The patient may stop CCM services at any time (effective at the end of the month) by phone call to the office staff.   Patient agreed to services and verbal consent obtained.   Follow up plan:   [place]

## 2021-04-03 ENCOUNTER — Ambulatory Visit: Payer: Medicare PPO | Admitting: Internal Medicine

## 2021-04-03 ENCOUNTER — Other Ambulatory Visit: Payer: Self-pay

## 2021-04-03 ENCOUNTER — Encounter: Payer: Self-pay | Admitting: Internal Medicine

## 2021-04-03 ENCOUNTER — Telehealth: Payer: Self-pay

## 2021-04-03 DIAGNOSIS — K029 Dental caries, unspecified: Secondary | ICD-10-CM | POA: Diagnosis not present

## 2021-04-03 DIAGNOSIS — Z91018 Allergy to other foods: Secondary | ICD-10-CM | POA: Diagnosis not present

## 2021-04-03 DIAGNOSIS — R140 Abdominal distension (gaseous): Secondary | ICD-10-CM

## 2021-04-03 DIAGNOSIS — R5382 Chronic fatigue, unspecified: Secondary | ICD-10-CM

## 2021-04-03 DIAGNOSIS — Z8673 Personal history of transient ischemic attack (TIA), and cerebral infarction without residual deficits: Secondary | ICD-10-CM | POA: Diagnosis not present

## 2021-04-03 MED ORDER — CILIDINIUM-CHLORDIAZEPOXIDE 2.5-5 MG PO CAPS: 1.0000 | ORAL_CAPSULE | Freq: Three times a day (TID) | ORAL | 2 refills | Status: DC | PRN

## 2021-04-03 NOTE — Progress Notes (Signed)
Subjective:  Patient ID: Alyssa Luna, female    DOB: Oct 30, 1933  Age: 87 y.o. MRN: [id number]  CC: GI Problem (Pt states her stomach just doesn't feel right. Denies n/v, diarrhea)   HPI SAORI UMHOLTZ presents for bloating, abd achy pain off and on, beef/dairy allergy F/u A fib, recent COVID  Outpatient Medications Prior to Visit  Medication Sig Dispense Refill   B Complex-C (B-COMPLEX WITH VITAMIN C) tablet Take by mouth.     bismuth subsalicylate (PEPTO BISMOL) 262 MG/15ML suspension Take 30 mLs by mouth every 6 (six) hours as needed.     cetirizine (ZYRTEC) 10 MG tablet Take 10 mg by mouth daily as needed for allergies.     Cholecalciferol 50 MCG (2000 UT) TBDP Take by mouth daily.     Cyanocobalamin (VITAMIN B-12 IJ) Inject as directed every 30 (thirty) days.     denosumab (PROLIA) 60 MG/ML SOLN injection Inject 60 mg into the skin every 6 (six) months. Administer in upper arm, thigh, or abdomen Unable to tolerate oral meds Dx severe osteoporosis 1.8 mL 6   diphenhydrAMINE HCl (BENADRYL ALLERGY CHILDRENS PO) Take by mouth at bedtime as needed. Takes at night when itching     famotidine (PEPCID) 20 MG tablet TAKE ONE TABLET BY MOUTH TWICE A DAY 180 tablet 3   gabapentin (NEURONTIN) 600 MG tablet TAKE 1/2 TABLET BY MOUTH 4 TIMES A DAY     levothyroxine (SYNTHROID) 25 MCG tablet TAKE ONE TABLET BY MOUTH DAILY BEFORE BREAKFAST 90 tablet 3   LORazepam (ATIVAN) 0.5 MG tablet Take 1/2-1 tablet daily as needed for anxiety and sleep 60 tablet 3   montelukast (SINGULAIR) 10 MG tablet TAKE ONE TABLET BY MOUTH DAILY 90 tablet 1   Rivaroxaban (XARELTO) 15 MG TABS tablet Take 15 mg by mouth daily.      Simethicone (GAS-X PO) Take 1 tablet by mouth daily as needed (flatulence).      busPIRone (BUSPAR) 7.5 MG tablet Take 1 tablet (7.5 mg total) by mouth 2 (two) times daily. (Patient not taking: No sig reported) 60 tablet 5   No facility-administered medications prior to visit.     ROS: Review of Systems  Constitutional:  Positive for fatigue. Negative for activity change, appetite change, chills and unexpected weight change.  HENT:  Negative for congestion, mouth sores and sinus pressure.   Eyes:  Negative for visual disturbance.  Respiratory:  Negative for cough and chest tightness.   Gastrointestinal:  Positive for abdominal distention, abdominal pain and nausea.  Genitourinary:  Negative for difficulty urinating, frequency and vaginal pain.  Musculoskeletal:  Negative for back pain and gait problem.  Skin:  Negative for pallor and rash.  Neurological:  Negative for dizziness, tremors, weakness, numbness and headaches.  Psychiatric/Behavioral:  Negative for confusion and sleep disturbance.    Objective:  BP 130/82 (BP Location: Left Arm)   Pulse (!) 56   Temp 97.7 F (36.5 C) (Oral)   SpO2 96%   BP Readings from Last 3 Encounters:  04/03/21 130/82  03/10/21 120/72  01/17/21 126/86    Wt Readings from Last 3 Encounters:  03/10/21 127 lb 6.4 oz (57.8 kg)  01/17/21 128 lb (58.1 kg)  12/12/20 128 lb 6.4 oz (58.2 kg)    Physical Exam Constitutional:      General: She is not in acute distress.    Appearance: She is well-developed.  HENT:     Head: Normocephalic.     Right Ear: External  ear normal.     Left Ear: External ear normal.     Nose: Nose normal.  Eyes:     General:        Right eye: No discharge.        Left eye: No discharge.     Conjunctiva/sclera: Conjunctivae normal.     Pupils: Pupils are equal, round, and reactive to light.  Neck:     Thyroid: No thyromegaly.     Vascular: No JVD.     Trachea: No tracheal deviation.  Cardiovascular:     Rate and Rhythm: Normal rate and regular rhythm.     Heart sounds: Normal heart sounds.  Pulmonary:     Effort: No respiratory distress.     Breath sounds: No stridor. No wheezing.  Abdominal:     General: Bowel sounds are normal. There is no distension.     Palpations: Abdomen is  soft. There is no mass.     Tenderness: There is no abdominal tenderness. There is no guarding or rebound.  Musculoskeletal:        General: No tenderness.     Cervical back: Normal range of motion and neck supple. No rigidity.  Lymphadenopathy:     Cervical: No cervical adenopathy.  Skin:    Findings: No erythema or rash.  Neurological:     Mental Status: She is oriented to person, place, and time.     Cranial Nerves: No cranial nerve deficit.     Motor: No weakness or abnormal muscle tone.     Coordination: Coordination abnormal.     Gait: Gait abnormal.     Deep Tendon Reflexes: Reflexes normal.  Psychiatric:        Behavior: Behavior normal.        Thought Content: Thought content normal.        Judgment: Judgment normal.  Using a walker  Lab Results  Component Value Date   WBC 4.0 01/17/2021   HGB 14.1 01/17/2021   HCT 40.8 01/17/2021   PLT 181.0 01/17/2021   GLUCOSE 80 01/17/2021   CHOL 160 02/25/2018   TRIG 119.0 02/25/2018   HDL 42.90 02/25/2018   LDLDIRECT 178.0 09/24/2011   LDLCALC 94 02/25/2018   ALT 19 01/17/2021   AST 28 01/17/2021   NA 140 01/17/2021   K 4.2 01/17/2021   CL 104 01/17/2021   CREATININE 0.74 01/17/2021   BUN 11 01/17/2021   CO2 30 01/17/2021   TSH 1.43 12/12/2020   INR 1.3 (H) 02/07/2019    No results found.  Assessment & Plan:     Walker Kehr, MD

## 2021-04-03 NOTE — Assessment & Plan Note (Addendum)
Alyssa Luna is concerned about her drug allergies.  [facility] will do teeth extraction/dentures pending.  [name] will obtain the list of painkillers that would be acceptable for her to take in the view of her allergies.  She will take this list to her oral surgeon to pick one to use.

## 2021-04-03 NOTE — Telephone Encounter (Signed)
Faxed ov npte from 03/10/21 when MD signed clearance for dental procedure.Marland KitchenJohny Luna

## 2021-04-03 NOTE — Assessment & Plan Note (Addendum)
Worse IBS, food allergies Try Papaine supplement, Librax prn prescribed  Potential benefits of a long term benzodiazepines  use as well as potential risks  and complications were explained to the patient and were aknowledged.

## 2021-04-03 NOTE — Telephone Encounter (Signed)
Marjorie Smolder is requesting the patient H & P be faxed over. She received the questionnaire but nothing else was attached     Fax [phone]

## 2021-04-03 NOTE — Assessment & Plan Note (Signed)
Cont w/avoidance diet

## 2021-04-03 NOTE — Patient Instructions (Signed)
  [email or web address]: Papaya Papain Enzyme 952m 200 Veggie Caps (100% Vegetarian, Non-GMO & Gluten Free)

## 2021-04-04 ENCOUNTER — Telehealth: Payer: Self-pay

## 2021-04-06 NOTE — Assessment & Plan Note (Addendum)
No relapse.  Continue with Xarelto

## 2021-04-06 NOTE — Assessment & Plan Note (Signed)
There has been some post COVID fatigue, better now

## 2021-04-08 NOTE — Chronic Care Management (AMB) (Signed)
Called and left message for patient  Left office number to call to reschedule for next available date   Tomasa Blase, PharmD Clinical Pharmacist, [place]

## 2021-04-10 ENCOUNTER — Ambulatory Visit: Payer: Medicare PPO

## 2021-04-15 ENCOUNTER — Ambulatory Visit: Payer: Medicare PPO | Admitting: Podiatry

## 2021-04-15 ENCOUNTER — Other Ambulatory Visit: Payer: Self-pay

## 2021-04-15 ENCOUNTER — Encounter: Payer: Self-pay | Admitting: Podiatry

## 2021-04-15 DIAGNOSIS — M79609 Pain in unspecified limb: Secondary | ICD-10-CM | POA: Diagnosis not present

## 2021-04-15 DIAGNOSIS — B351 Tinea unguium: Secondary | ICD-10-CM

## 2021-04-17 ENCOUNTER — Other Ambulatory Visit: Payer: Self-pay

## 2021-04-17 ENCOUNTER — Ambulatory Visit (INDEPENDENT_AMBULATORY_CARE_PROVIDER_SITE_OTHER): Payer: Medicare PPO

## 2021-04-17 DIAGNOSIS — E538 Deficiency of other specified B group vitamins: Secondary | ICD-10-CM | POA: Diagnosis not present

## 2021-04-17 MED ORDER — CYANOCOBALAMIN 1000 MCG/ML IJ SOLN
1000.0000 ug | Freq: Once | INTRAMUSCULAR | Status: AC
  Administered 2021-04-17: 1000 ug via INTRAMUSCULAR

## 2021-04-17 NOTE — Progress Notes (Addendum)
Pt given K52 w/o any complications. Medical screening examination/treatment/procedure(s) were performed by non-physician practitioner and as supervising physician I was immediately available for consultation/collaboration.  I agree with above. Lew Dawes, MD

## 2021-04-19 NOTE — Progress Notes (Signed)
Subjective: Alyssa Luna is a pleasant 87 y.o. female patient seen today for painful thick toenails that are difficult to trim. Pain interferes with ambulation. Aggravating factors include wearing enclosed shoe gear. Pain is relieved with periodic professional debridement.   She notes no new pedal problems on today's visit.[name]  PCP is Plotnikov, Evie Lacks, MD. Last visit was: 04/03/2021.  Allergies  Allergen Reactions   Alpha-Gal Itching and Nausea Only    Per patient, cannot have alpha-gal since tic bite episode.   Beef (Bovine) Protein Other (See Comments)    Alpha-gal allergy testing positive June 2018   Galactose Nausea Only    Per patient, cannot have alpha-gal since tic bite episode.   Pravastatin Anaphylaxis and Other (See Comments)    Legs ache   Alpha-D-Galactosidase Itching   Amlodipine     Leg swelling   Amlodipine-Atorvastatin     swelling   Cefdinir     Nausea from suspension   Colchicine Other (See Comments)   Doxycycline Other (See Comments)    Nausea - able to take w/food   Shellfish Allergy Other (See Comments)    Unknown   Tikosyn [Dofetilide] Other (See Comments)    Unknown reaction and was told never to take it due to problems during sleep    Actonel [Risedronate Sodium] Other (See Comments)    Not known   Alendronate Other (See Comments)    NOT KNOWN   Augmentin [Amoxicillin-Pot Clavulanate] Rash    Has patient had a PCN reaction causing immediate rash, facial/tongue/throat swelling, SOB or lightheadedness with hypotension: Yes Has patient had a PCN reaction causing severe rash involving mucus membranes or skin necrosis:No Has patient had a PCN reaction that required hospitalization: No Has patient had a PCN reaction occurring within the last 10 years: No If all of the above answers are "NO", then may proceed with Cephalosporin use.    Beef Allergy Rash   Cheese Rash   Ciprofloxacin Nausea Only   Evista [Raloxifene] Other (See Comments)    Unknown  reaction   Flagyl [Metronidazole] Other (See Comments)    Not known   Fosamax [Alendronate Sodium] Other (See Comments)    NOT KNOWN   Gold-Containing Drug Products Other (See Comments)    Per Dr   Loma Sousa [Escitalopram Oxalate] Other (See Comments)    shaky   Other Rash   Pork Allergy Rash   Risedronate Other (See Comments)    Unknown reaction   Simvastatin Other (See Comments)    REACTION: leg cramps, weakness   Tramadol Other (See Comments)    Mental disturbance changes    Objective: Physical Exam  General: Alyssa Luna is a pleasant 87 y.o. Caucasian female, in NAD. AAO x 3.   Vascular:  Capillary refill time to digits immediate b/l. Palpable DP pulse(s) b/l lower extremities Palpable PT pulse(s) b/l lower extremities Pedal hair present. Pedal hair sparse. Lower extremity skin temperature gradient within normal limits. No pain with calf compression b/l.  Dermatological:  Pedal skin with normal turgor, texture and tone b/l lower extremities. No open wounds b/l lower extremities. No interdigital macerations b/l lower extremities. Toenails 1-5 b/l elongated, discolored, dystrophic, thickened, crumbly with subungual debris and tenderness to dorsal palpation.  Musculoskeletal:  Normal muscle strength 5/5 to all lower extremity muscle groups bilaterally. Hammertoe(s) noted to the R 5th toe.  Neurological:  Protective sensation intact 5/5 intact bilaterally with 10g monofilament b/l.  Assessment and Plan:  1. Pain due to onychomycosis of nail     -  No new findings. No new orders. -Patient to continue soft, supportive shoe gear daily. -Toenails 1-5 b/l were debrided in length and girth with sterile nail nippers and dremel without iatrogenic bleeding.  -Patient to report any pedal injuries to medical professional immediately. -Patient/POA to call should there be question/concern in the interim.  Return in about 3 months (around 07/16/2021).  Marzetta Board, DPM

## 2021-04-22 ENCOUNTER — Ambulatory Visit: Payer: Medicare PPO | Admitting: Internal Medicine

## 2021-05-01 ENCOUNTER — Other Ambulatory Visit: Payer: Self-pay | Admitting: Internal Medicine

## 2021-05-07 ENCOUNTER — Ambulatory Visit: Payer: Medicare PPO

## 2021-05-07 DIAGNOSIS — I480 Paroxysmal atrial fibrillation: Secondary | ICD-10-CM | POA: Diagnosis not present

## 2021-05-07 DIAGNOSIS — I10 Essential (primary) hypertension: Secondary | ICD-10-CM | POA: Diagnosis not present

## 2021-05-12 DIAGNOSIS — L821 Other seborrheic keratosis: Secondary | ICD-10-CM | POA: Diagnosis not present

## 2021-05-12 DIAGNOSIS — D2239 Melanocytic nevi of other parts of face: Secondary | ICD-10-CM | POA: Diagnosis not present

## 2021-05-12 DIAGNOSIS — D22122 Melanocytic nevi of left lower eyelid, including canthus: Secondary | ICD-10-CM | POA: Diagnosis not present

## 2021-05-12 DIAGNOSIS — L814 Other melanin hyperpigmentation: Secondary | ICD-10-CM | POA: Diagnosis not present

## 2021-05-12 DIAGNOSIS — D1801 Hemangioma of skin and subcutaneous tissue: Secondary | ICD-10-CM | POA: Diagnosis not present

## 2021-05-12 DIAGNOSIS — D2261 Melanocytic nevi of right upper limb, including shoulder: Secondary | ICD-10-CM | POA: Diagnosis not present

## 2021-05-12 DIAGNOSIS — D225 Melanocytic nevi of trunk: Secondary | ICD-10-CM | POA: Diagnosis not present

## 2021-05-13 ENCOUNTER — Telehealth: Payer: Self-pay

## 2021-05-13 NOTE — Telephone Encounter (Signed)
Prolia VOB initiated via [email or web address]  Last OV:  Next OV:  Last Prolia inj: 07/25/20 Next Prolia inj DUE: 01/24/21

## 2021-05-13 NOTE — Telephone Encounter (Signed)
Prior Auth required  PA PROCESS DETAILS: PA is required. PA can be initiated by calling [phone] or online at [email or web address]

## 2021-06-04 DIAGNOSIS — Z91018 Allergy to other foods: Secondary | ICD-10-CM | POA: Diagnosis not present

## 2021-06-05 NOTE — Telephone Encounter (Signed)
Prior Auth initiated via [email or web address]

## 2021-06-11 ENCOUNTER — Encounter: Payer: Self-pay | Admitting: Internal Medicine

## 2021-06-11 ENCOUNTER — Ambulatory Visit: Payer: Medicare PPO | Admitting: Internal Medicine

## 2021-06-11 ENCOUNTER — Other Ambulatory Visit: Payer: Self-pay

## 2021-06-11 VITALS — BP 130/62 | HR 67 | Temp 98.4°F | Ht 62.0 in | Wt 123.8 lb

## 2021-06-11 DIAGNOSIS — E538 Deficiency of other specified B group vitamins: Secondary | ICD-10-CM

## 2021-06-11 DIAGNOSIS — G8929 Other chronic pain: Secondary | ICD-10-CM

## 2021-06-11 DIAGNOSIS — Z23 Encounter for immunization: Secondary | ICD-10-CM | POA: Diagnosis not present

## 2021-06-11 DIAGNOSIS — R5382 Chronic fatigue, unspecified: Secondary | ICD-10-CM

## 2021-06-11 DIAGNOSIS — M5440 Lumbago with sciatica, unspecified side: Secondary | ICD-10-CM | POA: Diagnosis not present

## 2021-06-11 MED ORDER — ACETAMINOPHEN 325 MG PO TABS: 325.0000 mg | ORAL_TABLET | Freq: Four times a day (QID) | ORAL | 3 refills | Status: AC | PRN

## 2021-06-11 MED ORDER — CYANOCOBALAMIN 1000 MCG/ML IJ SOLN
1000.0000 ug | Freq: Once | INTRAMUSCULAR | Status: AC
  Administered 2021-06-11: 1000 ug via INTRAMUSCULAR

## 2021-06-11 NOTE — Assessment & Plan Note (Signed)
Cont w/B12

## 2021-06-11 NOTE — Assessment & Plan Note (Addendum)
Worse LBP - worse; pain is in the waist and down, scoliosis. Pain - 6-8/10. Pt was seen at [facility] and by Dr Louanne Skye, had injections - she was told nothing can be done.  TENS Unit Muscle Stimulator Electric Shock Therapy for Muscles Dual Channel TENS EMS Unit Electronic Pulse Massager with 24 Modes Physical Therapy Equipment for Back Pain Relief  Heat/ice  Turmeric, Black Cherry extract, Magnesium tablets for pain   Back Massager with Heat, Snailax Shiatsu Massage Chair Pad for Back Pain, Rolling Kneading Massage Seat Cushion  Memory foam matrass top  LS MRI -- 01/2020 at [facility] reviewed.

## 2021-06-11 NOTE — Assessment & Plan Note (Signed)
Treat pain

## 2021-06-11 NOTE — Patient Instructions (Addendum)
TENS Unit Muscle Stimulator Electric Shock Therapy for Muscles Dual Channel TENS EMS Unit Electronic Pulse Massager with 24 Modes Physical Therapy Equipment for Back Pain Relief  Heat/ice  Turmeric, Black Cherry extract, Magnesium tablets for pain   Back Massager with Heat, Snailax Shiatsu Massage Chair Pad for Back Pain, Rolling Kneading Massage Seat Cushion  Memory foam matrass top     Spinal Stenosis Spinal stenosis is a condition that happens when the spinal canal narrows. The spinal canal is the space between the bones of your spine (vertebrae). This narrowing puts pressure on the spinal cord or nerves. Spinal stenosis can affect the vertebrae in the neck, upper back, and lower back. This condition can range from mild to severe. In some cases, there are no symptoms. What are the causes? This condition is caused by areas of bone pushing into the spinal canal. This condition may be present at birth (congenital), or it may be caused by: Slow breakdown of your vertebrae (spinal degeneration). This usually starts around 85 years of age. Injury (trauma) to your spine. Tumors in your spine. Calcium deposits in your spine. What increases the risk? The following factors may make you more likely to develop this condition: Being older than age 70. Having a problem present at birth with an abnormally shaped spine (congenitalspinal deformity), such as scoliosis. Having arthritis. What are the signs or symptoms? Symptoms of this condition include: Pain in the neck or back that is generally worse with activities, particularly when you stand or walk. Numbness, tingling, hot or cold sensations, weakness, or tiredness (fatigue) in your leg or legs. Pain going from the buttock, down the thigh, and to the calf (sciatica). This can happen in one or both legs. Frequent episodes of falling. A foot-slapping gait that leads to muscle weakness. In more severe cases, you may develop: Problems having a  bowel movement or urinating. Difficulty having sex. Loss of feeling in your legs and inability to walk. Symptoms may come on slowly and get worse over time. In some cases, there are no symptoms. How is this diagnosed? This condition is diagnosed based on your medical history and a physical exam. You will also have tests, such as an MRI, a CT scan, or an X-ray. How is this treated? Treatment for this condition often focuses on managing your pain and any other symptoms. Treatment may include: Practicing good posture to lessen pressure on your nerves. Exercising to strengthen muscles, build endurance, improve balance, and maintain range of motion. This may include physical therapy to restore movement and strength to your back. Losing weight, if needed. Medicines to reduce inflammation or pain. This may include a medicine that is injected into your spine (steroidinjection). Assistive devices, such as a corset or brace. In some cases, surgery may be needed. The most common procedure is decompression laminectomy. This is done to remove excess bone that puts pressure on your nerve roots. Follow these instructions at home: Managing pain, stiffness, and swelling  Practice good posture. If you were given a brace or a corset, wear it as told by your health care provider. Maintain a healthy weight. Talk with your health care provider if you need help losing weight. If directed, apply heat to the affected area as often as told by your health care provider. Use the heat source that your health care provider recommends, such as a moist heat pack or a heating pad. Place a towel between your skin and the heat source. Leave the heat on for 20-30  minutes. Remove the heat if your skin turns bright red. This is especially important if you are unable to feel pain, heat, or cold. You may have a greater risk of getting burned. Activity Do all exercises and stretches as told by your health care provider. Do not do  any activities that cause pain. Ask your health care provider what activities are safe for you. Do not lift anything that is heavier than 10 lb (4.5 kg), or the limit that you are told by your health care provider. Return to your normal activities as told by your health care provider. Ask your health care provider what activities are safe for you. General instructions Take over-the-counter and prescription medicines only as told by your health care provider. Do not use any products that contain nicotine or tobacco, such as cigarettes, e-cigarettes, and chewing tobacco. If you need help quitting, ask your health care provider. Eat a healthy diet. This includes plenty of fruits and vegetables, whole grains, and low-fat (lean) protein. Keep all follow-up visits as told by your health care provider. This is important. Contact a health care provider if: Your symptoms do not get better or they get worse. You have a fever. Get help right away if: You have new pain or symptoms of severe pain, such as: New or worsening pain in your neck or upper back. Severe pain that cannot be controlled with medicines. A severe headache that gets worse when you stand. You are dizzy. You have vision problems, such as blurred vision or double vision. You have nausea or you vomit. You develop new or worsening numbness or tingling in your back or legs. You have pain, redness, swelling, or warmth in your arm or leg. Summary Spinal stenosis is a condition that happens when the spinal canal narrows. The spinal canal is the space between the bones of your spine (vertebrae). This narrowing puts pressure on the spinal cord or nerves. This condition may be caused by a birth defect, breakdown of your vertebrae, trauma, tumors, or calcium deposits. Spinal stenosis can cause numbness, weakness, or pain in the buttocks, neck, back, and legs. This condition is usually diagnosed with your medical history, a physical exam, and  tests, such as an MRI, a CT scan, or an X-ray. This information is not intended to replace advice given to you by your health care provider. Make sure you discuss any questions you have with your health care provider. Document Revised: 06/01/2019 Document Reviewed: 06/01/2019 Elsevier Patient Education  2022 [employer].

## 2021-06-11 NOTE — Progress Notes (Signed)
Subjective:  Patient ID: Alyssa Luna, female    DOB: 19-Sep-1933  Age: 87 y.o. MRN: [id number]  CC: Follow-up (3 month f/u - Flu shot & B12)   HPI Alyssa Luna presents for chronic LBP - worse; pain is in the waist and down, scoliosis. Pain - 6-8/10. Pt was seen at [facility] and by Dr Louanne Skye, had injections - she was told nothing can be done. LS MRI -- 01/2020 at [facility] reviewed. Worse w/standing  Outpatient Medications Prior to Visit  Medication Sig Dispense Refill   B Complex-C (B-COMPLEX WITH VITAMIN C) tablet Take by mouth.     bismuth subsalicylate (PEPTO BISMOL) 262 MG/15ML suspension Take 30 mLs by mouth every 6 (six) hours as needed.     cetirizine (ZYRTEC) 10 MG tablet Take 10 mg by mouth daily as needed for allergies.     Cholecalciferol 50 MCG (2000 UT) TBDP Take by mouth daily.     clidinium-chlordiazePOXIDE (LIBRAX) 5-2.5 MG capsule Take 1 capsule by mouth 3 (three) times daily as needed. 60 capsule 2   Cyanocobalamin (VITAMIN B-12 IJ) Inject as directed every 30 (thirty) days.     denosumab (PROLIA) 60 MG/ML SOLN injection Inject 60 mg into the skin every 6 (six) months. Administer in upper arm, thigh, or abdomen Unable to tolerate oral meds Dx severe osteoporosis 1.8 mL 6   diphenhydrAMINE HCl (BENADRYL ALLERGY CHILDRENS PO) Take by mouth at bedtime as needed. Takes at night when itching     famotidine (PEPCID) 20 MG tablet TAKE ONE TABLET BY MOUTH TWICE A DAY 180 tablet 3   gabapentin (NEURONTIN) 600 MG tablet TAKE 1/2 TABLET BY MOUTH 4 TIMES A DAY     levothyroxine (SYNTHROID) 25 MCG tablet TAKE ONE TABLET BY MOUTH DAILY BEFORE BREAKFAST 90 tablet 3   LORazepam (ATIVAN) 0.5 MG tablet TAKE 1/2 TO 1 TABLET BY MOUTH DAILY AS NEEDED FOR ANXIETY OR FOR SLEEP 30 tablet 5   montelukast (SINGULAIR) 10 MG tablet TAKE ONE TABLET BY MOUTH DAILY 90 tablet 1   Rivaroxaban (XARELTO) 15 MG TABS tablet Take 15 mg by mouth daily.      Simethicone (GAS-X PO) Take 1 tablet by  mouth daily as needed (flatulence).      No facility-administered medications prior to visit.    ROS: Review of Systems  Constitutional:  Positive for fatigue. Negative for activity change, appetite change, chills and unexpected weight change.  HENT:  Negative for congestion, mouth sores and sinus pressure.   Eyes:  Negative for visual disturbance.  Respiratory:  Negative for cough and chest tightness.   Gastrointestinal:  Negative for abdominal pain and nausea.  Genitourinary:  Negative for difficulty urinating, frequency and vaginal pain.  Musculoskeletal:  Positive for arthralgias, back pain and gait problem.  Skin:  Negative for pallor and rash.  Neurological:  Negative for dizziness, tremors, weakness, numbness and headaches.  Psychiatric/Behavioral:  Negative for confusion and sleep disturbance. The patient is nervous/anxious.    Objective:  BP 130/62 (BP Location: Left Arm)   Pulse 67   Temp 98.4 F (36.9 C) (Oral)   Ht 5' 2"  (1.575 m)   Wt 123 lb 12.8 oz (56.2 kg)   SpO2 95%   BMI 22.64 kg/m   BP Readings from Last 3 Encounters:  06/11/21 130/62  04/03/21 130/82  03/10/21 120/72    Wt Readings from Last 3 Encounters:  06/11/21 123 lb 12.8 oz (56.2 kg)  03/10/21 127 lb 6.4 oz (57.8  kg)  01/17/21 128 lb (58.1 kg)    Physical Exam Constitutional:      General: She is not in acute distress.    Appearance: She is well-developed.  HENT:     Head: Normocephalic.     Right Ear: External ear normal.     Left Ear: External ear normal.     Nose: Nose normal.  Eyes:     General:        Right eye: No discharge.        Left eye: No discharge.     Conjunctiva/sclera: Conjunctivae normal.     Pupils: Pupils are equal, round, and reactive to light.  Neck:     Thyroid: No thyromegaly.     Vascular: No JVD.     Trachea: No tracheal deviation.  Cardiovascular:     Rate and Rhythm: Normal rate and regular rhythm.     Heart sounds: Normal heart sounds.  Pulmonary:      Effort: No respiratory distress.     Breath sounds: No stridor. No wheezing.  Abdominal:     General: Bowel sounds are normal. There is no distension.     Palpations: Abdomen is soft. There is no mass.     Tenderness: There is no abdominal tenderness. There is no guarding or rebound.  Musculoskeletal:        General: Tenderness present.     Cervical back: Normal range of motion and neck supple. No rigidity.  Lymphadenopathy:     Cervical: No cervical adenopathy.  Skin:    Findings: No erythema or rash.  Neurological:     Cranial Nerves: No cranial nerve deficit.     Motor: Weakness present. No abnormal muscle tone.     Coordination: Coordination normal.     Gait: Gait abnormal.     Deep Tendon Reflexes: Reflexes normal.  Psychiatric:        Behavior: Behavior normal.        Thought Content: Thought content normal.        Judgment: Judgment normal.   Scoliosis Antalgic gait  Lab Results  Component Value Date   WBC 4.0 01/17/2021   HGB 14.1 01/17/2021   HCT 40.8 01/17/2021   PLT 181.0 01/17/2021   GLUCOSE 80 01/17/2021   CHOL 160 02/25/2018   TRIG 119.0 02/25/2018   HDL 42.90 02/25/2018   LDLDIRECT 178.0 09/24/2011   LDLCALC 94 02/25/2018   ALT 19 01/17/2021   AST 28 01/17/2021   NA 140 01/17/2021   K 4.2 01/17/2021   CL 104 01/17/2021   CREATININE 0.74 01/17/2021   BUN 11 01/17/2021   CO2 30 01/17/2021   TSH 1.43 12/12/2020   INR 1.3 (H) 02/07/2019    No results found.  Assessment & Plan:   Problem List Items Addressed This Visit     B12 deficiency    Cont w/B12      Fatigue    Treat pain       LOW BACK PAIN    Worse LBP - worse; pain is in the waist and down, scoliosis. Pain - 6-8/10. Pt was seen at [facility] and by Dr Louanne Skye, had injections - she was told nothing can be done.  TENS Unit Muscle Stimulator Electric Shock Therapy for Muscles Dual Channel TENS EMS Unit Electronic Pulse Massager with 24 Modes Physical Therapy Equipment for Back Pain  Relief  Heat/ice  Turmeric, Black Cherry extract, Magnesium tablets for pain   Back Massager with Heat, Snailax Shiatsu  Massage Chair Pad for Back Pain, Rolling Kneading Massage Seat Cushion  Memory foam matrass top      Relevant Medications   acetaminophen (TYLENOL) 325 MG tablet   Other Visit Diagnoses     Needs flu shot    -  Primary   Relevant Orders   Flu Vaccine QUAD High Dose(Fluad) (Completed)         Meds ordered this encounter  Medications   cyanocobalamin ((VITAMIN B-12)) injection 1,000 mcg   acetaminophen (TYLENOL) 325 MG tablet    Sig: Take 1-2 tablets (325-650 mg total) by mouth every 6 (six) hours as needed for moderate pain.    Dispense:  100 tablet    Refill:  3      Follow-up: Return in about 3 months (around 09/11/2021) for a follow-up visit.  Walker Kehr, MD

## 2021-06-19 NOTE — Telephone Encounter (Signed)
Pt ready for scheduling on or after 01/24/21  Out-of-pocket cost due at time of visit: $0.00  Primary: [employer] co-insurance: 0% Admin fee co-insurance: $0  Secondary: n/a [employer] co-insurance:  Admin fee co-insurance:   Deductible: does not apply  Prior Auth: APPROVED PA# [id number] Valid: 08/17/21-08/16/22    ** This summary of benefits is an estimation of the patient's out-of-pocket cost. Exact cost may very based on individual plan coverage.

## 2021-06-20 DIAGNOSIS — H26493 Other secondary cataract, bilateral: Secondary | ICD-10-CM | POA: Diagnosis not present

## 2021-06-20 DIAGNOSIS — Z961 Presence of intraocular lens: Secondary | ICD-10-CM | POA: Diagnosis not present

## 2021-06-20 DIAGNOSIS — H04123 Dry eye syndrome of bilateral lacrimal glands: Secondary | ICD-10-CM | POA: Diagnosis not present

## 2021-06-20 DIAGNOSIS — H43812 Vitreous degeneration, left eye: Secondary | ICD-10-CM | POA: Diagnosis not present

## 2021-06-25 DIAGNOSIS — Z91014 Allergy to mammalian meats: Secondary | ICD-10-CM | POA: Diagnosis not present

## 2021-07-01 ENCOUNTER — Other Ambulatory Visit: Payer: Self-pay | Admitting: Internal Medicine

## 2021-07-04 DIAGNOSIS — M545 Low back pain, unspecified: Secondary | ICD-10-CM | POA: Diagnosis not present

## 2021-07-04 DIAGNOSIS — Z8679 Personal history of other diseases of the circulatory system: Secondary | ICD-10-CM | POA: Diagnosis not present

## 2021-07-04 DIAGNOSIS — E039 Hypothyroidism, unspecified: Secondary | ICD-10-CM | POA: Diagnosis not present

## 2021-07-04 DIAGNOSIS — G8929 Other chronic pain: Secondary | ICD-10-CM | POA: Diagnosis not present

## 2021-07-04 DIAGNOSIS — M810 Age-related osteoporosis without current pathological fracture: Secondary | ICD-10-CM | POA: Diagnosis not present

## 2021-07-04 DIAGNOSIS — F411 Generalized anxiety disorder: Secondary | ICD-10-CM | POA: Diagnosis not present

## 2021-07-07 ENCOUNTER — Ambulatory Visit (INDEPENDENT_AMBULATORY_CARE_PROVIDER_SITE_OTHER): Payer: Medicare PPO

## 2021-07-07 DIAGNOSIS — Z0000 Encounter for general adult medical examination without abnormal findings: Secondary | ICD-10-CM | POA: Diagnosis not present
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
## 2021-07-07 NOTE — Patient Instructions (Signed)
Alyssa Luna , Thank you for taking time to come for your Medicare Wellness Visit. I appreciate your ongoing commitment to your health goals. Please review the following plan we discussed and let me know if I can assist you in the future.   Screening recommendations/referrals: Colonoscopy: no longer required  Mammogram: no longer required  Bone Density: 09/16/2020 Recommended yearly ophthalmology/optometry visit for glaucoma screening and checkup Recommended yearly dental visit for hygiene and checkup  Vaccinations: Influenza vaccine: completed  Pneumococcal vaccine: completed  Tdap vaccine: 09/047/2021 Shingles vaccine: completed     Advanced directives: will provide copiesm   Conditions/risks identified: none   Next appointment: none    Preventive Care 40 Years and Older, Female Preventive care refers to lifestyle choices and visits with your health care provider that can promote health and wellness. What does preventive care include? A yearly physical exam. This is also called an annual well check. Dental exams once or twice a year. Routine eye exams. Ask your health care provider how often you should have your eyes checked. Personal lifestyle choices, including: Daily care of your teeth and gums. Regular physical activity. Eating a healthy diet. Avoiding tobacco and drug use. Limiting alcohol use. Practicing safe sex. Taking low-dose aspirin every day. Taking vitamin and mineral supplements as recommended by your health care provider. What happens during an annual well check? The services and screenings done by your health care provider during your annual well check will depend on your age, overall health, lifestyle risk factors, and family history of disease. Counseling  Your health care provider may ask you questions about your: Alcohol use. Tobacco use. Drug use. Emotional well-being. Home and relationship well-being. Sexual activity. Eating habits. History of  falls. Memory and ability to understand (cognition). Work and work Statistician. Reproductive health. Screening  You may have the following tests or measurements: Height, weight, and BMI. Blood pressure. Lipid and cholesterol levels. These may be checked every 5 years, or more frequently if you are over 30 years old. Skin check. Lung cancer screening. You may have this screening every year starting at age 16 if you have a 30-pack-year history of smoking and currently smoke or have quit within the past 15 years. Fecal occult blood test (FOBT) of the stool. You may have this test every year starting at age 28. Flexible sigmoidoscopy or colonoscopy. You may have a sigmoidoscopy every 5 years or a colonoscopy every 10 years starting at age 40. Hepatitis C blood test. Hepatitis B blood test. Sexually transmitted disease (STD) testing. Diabetes screening. This is done by checking your blood sugar (glucose) after you have not eaten for a while (fasting). You may have this done every 1-3 years. Bone density scan. This is done to screen for osteoporosis. You may have this done starting at age 79. Mammogram. This may be done every 1-2 years. Talk to your health care provider about how often you should have regular mammograms. Talk with your health care provider about your test results, treatment options, and if necessary, the need for more tests. Vaccines  Your health care provider may recommend certain vaccines, such as: Influenza vaccine. This is recommended every year. Tetanus, diphtheria, and acellular pertussis (Tdap, Td) vaccine. You may need a Td booster every 10 years. Zoster vaccine. You may need this after age 69. Pneumococcal 13-valent conjugate (PCV13) vaccine. One dose is recommended after age 33. Pneumococcal polysaccharide (PPSV23) vaccine. One dose is recommended after age 17. Talk to your health care provider about which screenings  and vaccines you need and how often you need  them. This information is not intended to replace advice given to you by your health care provider. Make sure you discuss any questions you have with your health care provider. Document Released: 08/30/2015 Document Revised: 04/22/2016 Document Reviewed: 06/04/2015 Elsevier Interactive Patient Education  2017 [employer] Prevention in the Home Falls can cause injuries. They can happen to people of all ages. There are many things you can do to make your home safe and to help prevent falls. What can I do on the outside of my home? Regularly fix the edges of walkways and driveways and fix any cracks. Remove anything that might make you trip as you walk through a door, such as a raised step or threshold. Trim any bushes or trees on the path to your home. Use bright outdoor lighting. Clear any walking paths of anything that might make someone trip, such as rocks or tools. Regularly check to see if handrails are loose or broken. Make sure that both sides of any steps have handrails. Any raised decks and porches should have guardrails on the edges. Have any leaves, snow, or ice cleared regularly. Use sand or salt on walking paths during winter. Clean up any spills in your garage right away. This includes oil or grease spills. What can I do in the bathroom? Use night lights. Install grab bars by the toilet and in the tub and shower. Do not use towel bars as grab bars. Use non-skid mats or decals in the tub or shower. If you need to sit down in the shower, use a plastic, non-slip stool. Keep the floor dry. Clean up any water that spills on the floor as soon as it happens. Remove soap buildup in the tub or shower regularly. Attach bath mats securely with double-sided non-slip rug tape. Do not have throw rugs and other things on the floor that can make you trip. What can I do in the bedroom? Use night lights. Make sure that you have a light by your bed that is easy to reach. Do not use  any sheets or blankets that are too big for your bed. They should not hang down onto the floor. Have a firm chair that has side arms. You can use this for support while you get dressed. Do not have throw rugs and other things on the floor that can make you trip. What can I do in the kitchen? Clean up any spills right away. Avoid walking on wet floors. Keep items that you use a lot in easy-to-reach places. If you need to reach something above you, use a strong step stool that has a grab bar. Keep electrical cords out of the way. Do not use floor polish or wax that makes floors slippery. If you must use wax, use non-skid floor wax. Do not have throw rugs and other things on the floor that can make you trip. What can I do with my stairs? Do not leave any items on the stairs. Make sure that there are handrails on both sides of the stairs and use them. Fix handrails that are broken or loose. Make sure that handrails are as long as the stairways. Check any carpeting to make sure that it is firmly attached to the stairs. Fix any carpet that is loose or worn. Avoid having throw rugs at the top or bottom of the stairs. If you do have throw rugs, attach them to the floor with carpet tape.  Make sure that you have a light switch at the top of the stairs and the bottom of the stairs. If you do not have them, ask someone to add them for you. What else can I do to help prevent falls? Wear shoes that: Do not have high heels. Have rubber bottoms. Are comfortable and fit you well. Are closed at the toe. Do not wear sandals. If you use a stepladder: Make sure that it is fully opened. Do not climb a closed stepladder. Make sure that both sides of the stepladder are locked into place. Ask someone to hold it for you, if possible. Clearly mark and make sure that you can see: Any grab bars or handrails. First and last steps. Where the edge of each step is. Use tools that help you move around (mobility aids)  if they are needed. These include: Canes. Walkers. Scooters. Crutches. Turn on the lights when you go into a dark area. Replace any light bulbs as soon as they burn out. Set up your furniture so you have a clear path. Avoid moving your furniture around. If any of your floors are uneven, fix them. If there are any pets around you, be aware of where they are. Review your medicines with your doctor. Some medicines can make you feel dizzy. This can increase your chance of falling. Ask your doctor what other things that you can do to help prevent falls. This information is not intended to replace advice given to you by your health care provider. Make sure you discuss any questions you have with your health care provider. Document Released: 05/30/2009 Document Revised: 01/09/2016 Document Reviewed: 09/07/2014 Elsevier Interactive Patient Education  2017 [employer].

## 2021-07-21 NOTE — Telephone Encounter (Signed)
LVM to schedule appt for injection.

## 2021-07-25 ENCOUNTER — Other Ambulatory Visit: Payer: Self-pay

## 2021-07-25 ENCOUNTER — Encounter: Payer: Self-pay | Admitting: Podiatry

## 2021-07-25 ENCOUNTER — Ambulatory Visit: Payer: Medicare PPO | Admitting: Podiatry

## 2021-07-25 DIAGNOSIS — B351 Tinea unguium: Secondary | ICD-10-CM | POA: Diagnosis not present

## 2021-07-25 DIAGNOSIS — M79609 Pain in unspecified limb: Secondary | ICD-10-CM | POA: Diagnosis not present

## 2021-07-28 ENCOUNTER — Telehealth: Payer: Self-pay

## 2021-07-28 NOTE — Progress Notes (Signed)
Chronic Care Management Pharmacy Assistant   Name: ETHELL BLATCHFORD  MRN: [id number] DOB: [birth year]  Alyssa Luna is an 87 y.o. year old female who presents for his initial CCM visit with the clinical pharmacist.  Reason for Encounter: Initial Visit    Recent office visits:  06/11/21 Plotnikov, Evie Lacks, MD-PCP (Follow up and Flu shot) Med changes: Acetaminophen 325 mg  04/03/21  Plotnikov, Evie Lacks, MD-PCP (GI Problem, Food Allergy) No orders, med changes: Chlordiazepoxide-Clodinium 5-2.5 mg cap  03/10/21 Plotnikov, Evie Lacks, MD-PCP (Medical Clearance) no orders or med changes  02/20/21 Plotnikov, Evie Lacks, MD-PCP (Covid-19) Med changes:Azithromycin 250 mg, methylpredlsolone 45m, and Nirmatrevir-Ritonavir  Recent consult visits:  07/25/21 JAcquanetta Sit DPM-Podiatry (Nail problem) no orders or med changes  04/15/21 GMarzetta Board DPM-Podiatry (Nail problem)no orders or med changes  Hospital visits:  None in previous 6 months  Medications: Outpatient Encounter Medications as of 07/28/2021  Medication Sig   acetaminophen (TYLENOL) 325 MG tablet Take 1-2 tablets (325-650 mg total) by mouth every 6 (six) hours as needed for moderate pain.   acetaminophen (TYLENOL) 500 MG tablet Take by mouth.   B Complex-C (B-COMPLEX WITH VITAMIN C) tablet Take by mouth.   bismuth subsalicylate (PEPTO BISMOL) 262 MG/15ML suspension Take 30 mLs by mouth every 6 (six) hours as needed.   cetirizine (ZYRTEC) 10 MG tablet Take 10 mg by mouth daily as needed for allergies.   cetirizine (ZYRTEC) 5 MG tablet Take by mouth.   Cholecalciferol 50 MCG (2000 UT) TBDP Take by mouth daily.   clidinium-chlordiazePOXIDE (LIBRAX) 5-2.5 MG capsule Take 1 capsule by mouth 3 (three) times daily as needed.   Cyanocobalamin (VITAMIN B-12 IJ) Inject as directed every 30 (thirty) days.   denosumab (PROLIA) 60 MG/ML SOLN injection Inject 60 mg into the skin every 6 (six) months. Administer in upper arm,  thigh, or abdomen Unable to tolerate oral meds Dx severe osteoporosis   diphenhydrAMINE HCl (BENADRYL ALLERGY CHILDRENS PO) Take by mouth at bedtime as needed. Takes at night when itching   famotidine (PEPCID) 20 MG tablet TAKE ONE TABLET BY MOUTH TWICE A DAY   famotidine (PEPCID) 20 MG tablet Take by mouth.   gabapentin (NEURONTIN) 300 MG capsule Take by mouth.   gabapentin (NEURONTIN) 600 MG tablet TAKE 1/2 TABLET BY MOUTH 4 TIMES A DAY   levothyroxine (SYNTHROID) 25 MCG tablet TAKE ONE TABLET BY MOUTH DAILY BEFORE BREAKFAST   LORazepam (ATIVAN) 0.5 MG tablet TAKE 1/2 TO 1 TABLET BY MOUTH DAILY AS NEEDED FOR ANXIETY OR FOR SLEEP   montelukast (SINGULAIR) 10 MG tablet TAKE ONE TABLET BY MOUTH DAILY   montelukast (SINGULAIR) 10 MG tablet Take by mouth.   Rivaroxaban (XARELTO) 15 MG TABS tablet Take 15 mg by mouth daily.    Simethicone (GAS-X PO) Take 1 tablet by mouth daily as needed (flatulence).    No facility-administered encounter medications on file as of 07/28/2021.   Medication List: acetaminophen (TYLENOL) 325 MG tablet acetaminophen (TYLENOL) 500 MG tablet B Complex-C (B-COMPLEX WITH VITAMIN C) tablet bismuth subsalicylate (PEPTO BISMOL) 262 MG/15ML suspension cetirizine (ZYRTEC) 10 MG tablet cetirizine (ZYRTEC) 5 MG tablet Cholecalciferol 50 MCG (2000 UT) TBDP clidinium-chlordiazePOXIDE (LIBRAX) 5-2.5 MG capsule-last fill 04/03/21 60 ds Cyanocobalamin (VITAMIN B-12 IJ) denosumab (PROLIA) 60 MG/ML SOLN injection diphenhydrAMINE HCl (BENADRYL ALLERGY CHILDRENS PO) famotidine (PEPCID) 20 MG tablet-last fill 03/25/21 90 ds gabapentin (NEURONTIN) 600 MG tablet-last fill 06/14/20 90 ds  levothyroxine (SYNTHROID) 25 MCG tablet-last fill 08/26/20  90 ds LORazepam (ATIVAN) 0.5 MG tablet-last fill 05/06/21 30 ds montelukast (SINGULAIR) 10 MG tablet-last fill 07/01/21 90 ds Rivaroxaban (XARELTO) 15 MG TABS tablet-last fill 05/08/20 90 ds Simethicone (GAS-X PO)  Care  Gaps: Colonoscopy-NA Diabetic Foot Exam-NA Mammogram-NA Ophthalmology-NA Dexa Scan -NA  Annual Well Visit - NA Micro albumin-NA Hemoglobin A1c- NA  Star Rating Drugs: None ID  Ethelene Hal Clinical Pharmacist Assistant [phone]

## 2021-07-31 NOTE — Progress Notes (Signed)
Subjective:  Patient ID: Alyssa Luna, female    DOB: 1934/04/15,  MRN: [id number]  Alyssa Luna presents to clinic today for painful elongated mycotic toenails 1-5 bilaterally which are tender when wearing enclosed shoe gear. Pain is relieved with periodic professional debridement.  Patient states she hit her left great toe a few days ago.  She states it was a little sore, but not too painful. Denies any redness, drainage or swelling.  PCP is Plotnikov, Evie Lacks, MD , and last visit was 04/03/2021.  Allergies  Allergen Reactions   Alpha-Gal Itching and Nausea Only    Per patient, cannot have alpha-gal since tic bite episode. Per patient, cannot have alpha-gal since tic bite episode. Per patient, cannot have alpha-gal since tic bite episode.   Beef (Bovine) Protein Other (See Comments)    Alpha-gal allergy testing positive June 2018   Galactose Nausea Only    Per patient, cannot have alpha-gal since tic bite episode.   Pravastatin Anaphylaxis and Other (See Comments)    Legs ache   Lac Bovis Nausea Only and Rash   Alpha-D-Galactosidase Itching   Amlodipine     Leg swelling   Amlodipine-Atorvastatin     swelling   Cefdinir     Nausea from suspension   Colchicine Other (See Comments)   Doxycycline Other (See Comments)    Nausea - able to take w/food   Shellfish Allergy Other (See Comments)    Unknown   Tikosyn [Dofetilide] Other (See Comments)    Unknown reaction and was told never to take it due to problems during sleep    Actonel [Risedronate Sodium] Other (See Comments)    Not known   Alendronate Other (See Comments)    NOT KNOWN   Augmentin [Amoxicillin-Pot Clavulanate] Rash    Has patient had a PCN reaction causing immediate rash, facial/tongue/throat swelling, SOB or lightheadedness with hypotension: Yes Has patient had a PCN reaction causing severe rash involving mucus membranes or skin necrosis:No Has patient had a PCN reaction that required  hospitalization: No Has patient had a PCN reaction occurring within the last 10 years: No If all of the above answers are "NO", then may proceed with Cephalosporin use.    Beef Allergy Rash   Cheese Rash   Ciprofloxacin Nausea Only   Evista [Raloxifene] Other (See Comments)    Unknown reaction   Flagyl [Metronidazole] Other (See Comments)    Not known   Fosamax [Alendronate Sodium] Other (See Comments)    NOT KNOWN   Gold-Containing Drug Products Other (See Comments)    Per Dr   Loma Sousa [Escitalopram Oxalate] Other (See Comments)    shaky   Other Rash   Pork Allergy Rash   Risedronate Other (See Comments)    Unknown reaction   Simvastatin Other (See Comments)    REACTION: leg cramps, weakness   Tramadol Other (See Comments)    Mental disturbance changes    Review of Systems: Negative except as noted in the HPI. Objective:   Constitutional Alyssa Luna is a pleasant 87 y.o. Caucasian female, WD, WN in NAD. AAO x 3.   Vascular CFT immediate b/l LE. Palpable DP/PT pulses b/l LE. Digital hair sparse b/l. Skin temperature gradient WNL b/l. No pain with calf compression b/l. No edema noted b/l. No cyanosis or clubbing noted b/l LE.  Neurologic Normal speech. Oriented to person, place, and time. Protective sensation intact 5/5 intact bilaterally with 10g monofilament b/l.  Dermatologic Pedal integument with normal turgor, texture  and tone b/l LE. No open wounds b/l. No interdigital macerations b/l. Toenails 1-5 b/l elongated, thickened, discolored with subungual debris. +Tenderness with dorsal palpation of nailplates. No hyperkeratotic or porokeratotic lesions present.  Orthopedic: Normal muscle strength 5/5 to all lower extremity muscle groups bilaterally. Hammertoe(s) noted to the R 5th toe.[name] No pain, crepitus or joint limitation noted with ROM b/l LE.  Patient ambulates independently without assistive aids. No edema noted left hallux, no pain on palpation or ROM. No ecchymosis.    Radiographs: None   Assessment:   1. Pain due to onychomycosis of nail    Plan:  Patient was evaluated and treated and all questions answered. Consent given for treatment as described below: -Examined patient. -Mycotic toenails 1-5 bilaterally were debrided in length and girth with sterile nail nippers and dremel without incident. -Patient/POA to call should there be question/concern in the interim.  Return in about 3 months (around 10/23/2021).  Marzetta Board, DPM

## 2021-08-05 ENCOUNTER — Telehealth: Payer: Medicare PPO

## 2021-08-05 NOTE — Progress Notes (Deleted)
Chronic Care Management Pharmacy Note  08/05/2021 Name:  Alyssa Luna MRN:  [id number] DOB:  23-Jul-1934  Summary: ***  Recommendations/Changes made from today's visit: ***  Plan: ***  Subjective: Alyssa Luna is an 87 y.o. year old female who is a primary patient of Plotnikov, Evie Lacks, MD.  The CCM team was consulted for assistance with disease management and care coordination needs.    Engaged with patient by telephone for initial visit in response to provider referral for pharmacy case management and/or care coordination services.   Consent to Services:  The patient was given the following information about Chronic Care Management services today, agreed to services, and gave verbal consent: 1. CCM service includes personalized support from designated clinical staff supervised by the primary care provider, including individualized plan of care and coordination with other care providers 2. 24/7 contact phone numbers for assistance for urgent and routine care needs. 3. Service will only be billed when office clinical staff spend 20 minutes or more in a month to coordinate care. 4. Only one practitioner may furnish and bill the service in a calendar month. 5.The patient may stop CCM services at any time (effective at the end of the month) by phone call to the office staff. 6. The patient will be responsible for cost sharing (co-pay) of up to 20% of the service fee (after annual deductible is met). Patient agreed to services and consent obtained.  Patient Care Team: Plotnikov, Evie Lacks, MD as PCP - General Gottsegen, Cherly Anderson, MD (Inactive) (Obstetrics and Gynecology) Elsie Saas, MD (Orthopedic Surgery) Crista Luria, MD (Dermatology) Evans Lance, MD (Cardiology) Eunice Blase, MD as Attending Physician (Family Medicine) Leta Baptist, Earlean Polka, MD (Neurology) Ward, Jeani Hawking, MD as Referring Physician (Internal Medicine) Milas Gain., MD (Inactive) as Referring  Physician (Neurosurgery) Ward, Jeani Hawking, MD as Referring Physician (Internal Medicine) Tomasa Blase, [facility] as Pharmacist (Pharmacist)  Recent office visits: 06/11/2021 - Dr. Alain Marion - chronic low back pain - recommended TENS unit - heat/ice, turmeric, black cherry extract, and magnesium tablets for pain - follow up in 3 months  04/03/2021 - Dr. Alain Marion - GI issues - bloating, achy pain - librax prn - follow up in 3 months   Recent consult visits: 07/25/2021 - Dr. Elisha Ponder - Podiatry - routine foot care - follow up in 3 months  06/24/2021 - Dr. Shan Levans - back pain - advised for wean of lorazepam - patient agreeable - increase APAP to 5102m 4 times daily - also to reduce gabapentin to 3 times daily  06/25/2021 - Dr. CZigmund Gottron- Allergist - no changes to medications  05/07/2021 - Dr. BEdwin Dada- Cardiology - no recurrence of afib - BP controlled at home, no medication changes  - follow up in 1 year  04/15/2021 - Dr. GElisha Ponder- Podiatry  - routine foot care - follow up in 3 months  03/26/2021 - KThressa ShellerPA-C - orthopedics - left hand swelling - no changes to medications  03/10/2021 - Dr. PAlain Marion- no changes to medications - follow up in 3 months   Hospital visits: 03/25/2021 - Bhupinder Multani NP - orthopaedic Surgery - evaluation of left wrist pain  - accidentally slammed with door - noted closed fracture of left wrist - scheduled walk in appointment at ortho clinic for further evaluation   Objective:  Lab Results  Component Value Date   CREATININE 0.74 01/17/2021   BUN 11 01/17/2021   GFR 73.81 01/17/2021   GFRNONAA 50 (L)  04/27/2018   GFRAA 58 (L) 04/27/2018   NA 140 01/17/2021   K 4.2 01/17/2021   CALCIUM 9.4 01/17/2021   CO2 30 01/17/2021   GLUCOSE 80 01/17/2021    Lab Results  Component Value Date/Time   GFR 73.81 01/17/2021 12:05 PM   GFR 72.68 12/12/2020 03:38 PM    Last diabetic Eye exam:  No results found for: HMDIABEYEEXA  Last diabetic Foot exam:  No results found for:  HMDIABFOOTEX   Lab Results  Component Value Date   CHOL 160 02/25/2018   HDL 42.90 02/25/2018   LDLCALC 94 02/25/2018   LDLDIRECT 178.0 09/24/2011   TRIG 119.0 02/25/2018   CHOLHDL 4 02/25/2018    Hepatic Function Latest Ref Rng & Units 01/17/2021 12/12/2020 07/25/2020  Total Protein 6.0 - 8.3 g/dL 6.9 6.9 6.9  Albumin 3.5 - 5.2 g/dL 4.2 4.1 4.1  AST 0 - 37 U/L _0 ALT 0 - 35 U/L _1 Alk Phosphatase 39 - 117 U/L 63 55 59  Total Bilirubin 0.2 - 1.2 mg/dL 0.9 0.8 0.6  Bilirubin, Direct 0.0 - 0.3 mg/dL - - -    Lab Results  Component Value Date/Time   TSH 1.43 12/12/2020 03:38 PM   TSH 1.70 07/25/2020 02:43 PM   FREET4 1.01 12/12/2020 03:38 PM   FREET4 0.90 07/25/2020 02:43 PM    CBC Latest Ref Rng & Units 01/17/2021 12/12/2020 07/25/2020  WBC 4.0 - 10.5 K/uL 4.0 4.4 4.3  Hemoglobin 12.0 - 15.0 g/dL 14.1 14.0 13.9  Hematocrit 36.0 - 46.0 % 40.8 40.3 40.8  Platelets 150.0 - 400.0 K/uL 181.0 173.0 190.0    Lab Results  Component Value Date/Time   VD25OH 29.31 (L) 06/22/2017 02:23 PM   VD25OH 32.53 09/18/2015 03:43 PM    Clinical ASCVD: Yes  The ASCVD Risk score (Arnett DK, et al., 2019) failed to calculate for the following reasons:   The 2019 ASCVD risk score is only valid for ages 46 to 52    Depression screen PHQ 2/9 07/07/2021 07/07/2021 04/17/2020  Decreased Interest 0 0 0  Down, Depressed, Hopeless 0 0 0  PHQ - 2 Score 0 0 0  Altered sleeping - - -  Tired, decreased energy - - -  Change in appetite - - -  Feeling bad or failure about yourself  - - -  Trouble concentrating - - -  Moving slowly or fidgety/restless - - -  Suicidal thoughts - - -  PHQ-9 Score - - -  Difficult doing work/chores - - -  Some recent data might be hidden    Social History   Tobacco Use  Smoking Status Never  Smokeless Tobacco Never   BP Readings from Last 3 Encounters:  06/11/21 130/62  04/03/21 130/82  03/10/21 120/72   Pulse Readings from Last 3 Encounters:   06/11/21 67  04/03/21 (!) 56  03/10/21 76   Wt Readings from Last 3 Encounters:  06/11/21 123 lb 12.8 oz (56.2 kg)  03/10/21 127 lb 6.4 oz (57.8 kg)  01/17/21 128 lb (58.1 kg)   BMI Readings from Last 3 Encounters:  06/11/21 22.64 kg/m  03/10/21 23.30 kg/m  01/17/21 23.41 kg/m    Assessment/Interventions: Review of patient past medical history, allergies, medications, health status, including review of consultants reports, laboratory and other test data, was performed as part of comprehensive evaluation and provision of chronic care management services.   SDOH:  (Social Determinants of Health) assessments and interventions performed:  Yes  SDOH Screenings   Alcohol Screen: Low Risk    Last Alcohol Screening Score (AUDIT): 0  Depression (PHQ2-9): Low Risk    PHQ-2 Score: 0  Financial Resource Strain: Low Risk    Difficulty of Paying Living Expenses: Not hard at all  Food Insecurity: No Food Insecurity   Worried About Charity fundraiser in the Last Year: Never true   Ran Out of Food in the Last Year: Never true  Housing: Low Risk    Last Housing Risk Score: 0  Physical Activity: Inactive   Days of Exercise per Week: 0 days   Minutes of Exercise per Session: 0 min  Social Connections: Moderately Isolated   Frequency of Communication with Friends and Family: Twice a week   Frequency of Social Gatherings with Friends and Family: Twice a week   Attends Religious Services: More than 4 times per year   Active Member of [employer] or Organizations: No   Attends Archivist Meetings: Never   Marital Status: Widowed  Stress: No Stress Concern Present   Feeling of Stress : Not at all  Tobacco Use: Low Risk    Smoking Tobacco Use: Never   Smokeless Tobacco Use: Never   Passive Exposure: Not on file  Transportation Needs: No Transportation Needs   Lack of Transportation (Medical): No   Lack of Transportation (Non-Medical): No    CCM Care Plan  Allergies  Allergen  Reactions   Alpha-Gal Itching and Nausea Only    Per patient, cannot have alpha-gal since tic bite episode. Per patient, cannot have alpha-gal since tic bite episode. Per patient, cannot have alpha-gal since tic bite episode.   Beef (Bovine) Protein Other (See Comments)    Alpha-gal allergy testing positive June 2018   Galactose Nausea Only    Per patient, cannot have alpha-gal since tic bite episode.   Pravastatin Anaphylaxis and Other (See Comments)    Legs ache   Lac Bovis Nausea Only and Rash   Alpha-D-Galactosidase Itching   Amlodipine     Leg swelling   Amlodipine-Atorvastatin     swelling   Cefdinir     Nausea from suspension   Colchicine Other (See Comments)   Doxycycline Other (See Comments)    Nausea - able to take w/food   Shellfish Allergy Other (See Comments)    Unknown   Tikosyn [Dofetilide] Other (See Comments)    Unknown reaction and was told never to take it due to problems during sleep    Actonel [Risedronate Sodium] Other (See Comments)    Not known   Alendronate Other (See Comments)    NOT KNOWN   Augmentin [Amoxicillin-Pot Clavulanate] Rash    Has patient had a PCN reaction causing immediate rash, facial/tongue/throat swelling, SOB or lightheadedness with hypotension: Yes Has patient had a PCN reaction causing severe rash involving mucus membranes or skin necrosis:No Has patient had a PCN reaction that required hospitalization: No Has patient had a PCN reaction occurring within the last 10 years: No If all of the above answers are "NO", then may proceed with Cephalosporin use.    Beef Allergy Rash   Cheese Rash   Ciprofloxacin Nausea Only   Evista [Raloxifene] Other (See Comments)    Unknown reaction   Flagyl [Metronidazole] Other (See Comments)    Not known   Fosamax [Alendronate Sodium] Other (See Comments)    NOT KNOWN   Gold-Containing Drug Products Other (See Comments)    Per Dr   Dwaine Gale  Oxalate] Other (See Comments)    shaky    Other Rash   Pork Allergy Rash   Risedronate Other (See Comments)    Unknown reaction   Simvastatin Other (See Comments)    REACTION: leg cramps, weakness   Tramadol Other (See Comments)    Mental disturbance changes    Medications Reviewed Today     Reviewed by Ulice Brilliant (Pharmacy Technician) on 07/25/21 at [place] List Status: <None>   Medication Order Taking? Sig Documenting Provider Last Dose Status Informant  acetaminophen (TYLENOL) 325 MG tablet [id number]  Take 1-2 tablets (325-650 mg total) by mouth every 6 (six) hours as needed for moderate pain. Plotnikov, Evie Lacks, MD  Active   acetaminophen (TYLENOL) 500 MG tablet [id number]  Take by mouth. [provider]  Active   B Complex-C (B-COMPLEX WITH VITAMIN C) tablet [id number]  Take by mouth. [provider]  Active   bismuth subsalicylate (PEPTO BISMOL) 262 MG/15ML suspension [id number]  Take 30 mLs by mouth every 6 (six) hours as needed. [provider]  Active   cetirizine (ZYRTEC) 10 MG tablet [id number]  Take 10 mg by mouth daily as needed for allergies. [provider]  Active Multiple Informants  cetirizine (ZYRTEC) 5 MG tablet [id number]  Take by mouth. [provider]  Active   Cholecalciferol 50 MCG (2000 UT) TBDP [id number]  Take by mouth daily. [provider]  Active   clidinium-chlordiazePOXIDE (LIBRAX) 5-2.5 MG capsule [id number]  Take 1 capsule by mouth 3 (three) times daily as needed. Plotnikov, Evie Lacks, MD  Active   Cyanocobalamin (VITAMIN B-12 IJ) [id number]  Inject as directed every 30 (thirty) days. [provider]  Active Multiple Informants           Med Note Duffy Bruce, KATHY N   Wed Apr 27, 2018  6:18 AM)    denosumab (PROLIA) 60 MG/ML SOLN injection [id number]  Inject 60 mg into the skin every 6 (six) months. Administer in upper arm, thigh, or abdomen Unable to tolerate oral meds Dx severe osteoporosis Plotnikov, Evie Lacks, MD  Active  Multiple Informants  diphenhydrAMINE HCl (BENADRYL ALLERGY CHILDRENS PO) [id number]  Take by mouth at bedtime as needed. Takes at night when itching [provider]  Active Multiple Informants  famotidine (PEPCID) 20 MG tablet [id number]  TAKE ONE TABLET BY MOUTH TWICE A DAY Plotnikov, Evie Lacks, MD  Active   famotidine (PEPCID) 20 MG tablet [id number]  Take by mouth. [provider]  Active   gabapentin (NEURONTIN) 300 MG capsule [id number]  Take by mouth. [provider]  Active   gabapentin (NEURONTIN) 600 MG tablet [id number]  TAKE 1/2 TABLET BY MOUTH 4 TIMES A DAY [provider]  Active   levothyroxine (SYNTHROID) 25 MCG tablet [id number]  TAKE ONE TABLET BY MOUTH DAILY BEFORE BREAKFAST Plotnikov, Evie Lacks, MD  Active   LORazepam (ATIVAN) 0.5 MG tablet [id number]  TAKE 1/2 TO 1 TABLET BY MOUTH DAILY AS NEEDED FOR ANXIETY OR FOR SLEEP Plotnikov, Evie Lacks, MD  Active   montelukast (SINGULAIR) 10 MG tablet [id number]  TAKE ONE TABLET BY MOUTH DAILY Plotnikov, Evie Lacks, MD  Active   montelukast (SINGULAIR) 10 MG tablet [id number]  Take by mouth. [provider]  Active   Rivaroxaban (XARELTO) 15 MG TABS tablet [id number]  Take 15 mg by mouth daily.  [provider]  Active Multiple Informants           Med Note (DALY,  KATHY N   Wed Apr 27, 2018  6:18 AM)    Simethicone (GAS-X PO) [id number]  Take 1 tablet by mouth daily as needed (flatulence).  [provider]  Active Multiple Informants            Patient Active Problem List   Diagnosis Date Noted   Bloating 04/03/2021   Caries involving multiple surfaces of tooth 03/10/2021   COVID-19 02/20/2021   Acute sinusitis 12/14/2019   Acquired hypothyroidism 07/20/2019   History of atrial fibrillation 07/20/2019   RBBB 07/20/2019   Anemia 07/19/2019   Hoarseness of voice 06/21/2019   Anxiety disorder 04/12/2019   Chronic venous insufficiency 03/25/2019   Dark stools 02/24/2019    Meningioma, cerebral ([place]) 02/16/2019   Grief reaction 10/18/2018   Dysfunction of left eustachian tube 07/07/2018   Ear pain, left 06/13/2018   Colitis, acute 03/14/2018   Constipation 03/14/2018   Dysphonia 03/17/2017   Arthralgia 02/26/2017   Dyspnea 01/27/2017   Tick bite 01/27/2017   Food allergy 01/27/2017   Ingrown toenail 12/08/2016   Acute upper respiratory infection 07/13/2016   [facility] spotted fever) 04/29/2016   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   Onychomycosis 02/04/2016   Osteoporosis, post-menopausal 12/15/2015   Lumbar radiculopathy 08/14/2015   Lumbar scoliosis 08/14/2015   Long term current use of anticoagulant therapy 07/18/2015   Dysuria 06/16/2015   Allergic rhinitis 06/13/2015   Closed wedge compression fracture of fourth lumbar vertebra ([place])    Thrush, oral 01/04/2015   Itching 01/04/2015   Hypokalemia 11/06/2014   Fatigue 10/19/2014   LLQ abdominal pain 10/02/2014   Swelling of left knee joint 10/02/2014   Nausea without vomiting 10/02/2014   DJD (degenerative joint disease) of knee 08/27/2014   Primary localized osteoarthritis of left knee    Breast cancer ([place])    GERD (gastroesophageal reflux disease)    Preop exam for internal medicine 06/13/2014   Bradycardia 11/09/2013   Essential hypertension 11/09/2013   Encounter for therapeutic drug monitoring 09/08/2013   Well adult exam 05/21/2013   Preop cardiovascular exam 05/08/2013   Tremor, essential 12/14/2012   Right hip pain 05/10/2012   Vertigo 03/09/2012   Dyslipidemia 03/31/2011   Meningioma ([place]) 02/03/2011   History of TIA (transient ischemic attack) 11/13/2010   Abnormal CT of brain 11/13/2010   CAROTID BRUIT 07/28/2010   Edema 05/02/2010   Atrial fibrillation ([place]) 04/08/2010   SYNCOPE 03/31/2010   LOW BACK PAIN 06/05/2009   Chronic maxillary sinusitis 01/31/2009   EFFUSION OF JOINT OTHER SPECIFIED SITE 01/31/2009   Diarrhea 05/31/2008   ABNORMAL CHEST XRAY  05/15/2008   HEMATURIA, MICROSCOPIC, HX OF 08/04/2007   B12 deficiency 03/21/2007   Vitamin D deficiency 03/21/2007   Disease of tricuspid valve 03/21/2007   DIVERTICULOSIS, COLON 03/21/2007   Osteoarthritis 03/21/2007   Osteoporosis 03/21/2007   Personal history of malignant neoplasm of breast 03/21/2007   COLONIC POLYPS, HX OF 03/21/2007    Immunization History  Administered Date(s) Administered   Fluad Quad(high Dose 65+) 04/12/2019, 05/17/2020, 06/11/2021   Influenza Split 05/10/2012   Influenza Whole 05/15/2008, 06/05/2009, 05/27/2010   Influenza, High Dose Seasonal PF 06/30/2016, 05/04/2017, 05/12/2018   Influenza,inj,Quad PF,6+ Mos 05/16/2013, 05/29/2014, 05/17/2015   Influenza-Unspecified 05/16/2013, 05/29/2014, 05/17/2015   Moderna Sars-Covid-2 Vaccination 08/25/2019, 09/22/2019   Pneumococcal Conjugate-13 08/14/2014   Pneumococcal Polysaccharide-23 04/22/2006   Td 01/17/2007   Tdap 04/23/2020   Zoster Recombinat (Shingrix) 01/21/2018, 03/31/2018   Zoster, Live 06/13/2013    Conditions to  be addressed/monitored:  {USCCMDZASSESSMENTOPTIONS:23563}  There are no care plans that you recently modified to display for this patient.     Medication Assistance: {MEDASSISTANCEINFO:25044}  Compliance/Adherence/Medication fill history: Care Gaps: ***  Patient's preferred pharmacy is:  [facility] [id number] - [address], [place] - [address] [place] [address] Phone: [phone] Fax: [phone]   Uses pill box? {Yes or If no, why not?:20788} Pt endorses ***% compliance  Care Plan and Follow Up Patient Decision:  {FOLLOWUP:24991}  Plan: {CM FOLLOW UP YKDX:83382}  ***  Current Barriers:  {pharmacybarriers:24917}  Pharmacist Clinical Goal(s):  Patient will {PHARMACYGOALCHOICES:24921} through collaboration with PharmD and provider.   Interventions: 1:1 collaboration with Plotnikov, Evie Lacks, MD regarding development and  update of comprehensive plan of care as evidenced by provider attestation and co-signature Inter-disciplinary care team collaboration (see longitudinal plan of care) Comprehensive medication review performed; medication list updated in electronic medical record   Hyperlipidemia: (LDL goal < 100) -Controlled Lab Results  Component Value Date   LDLCALC 94 02/25/2018  -Current treatment: *** -Medications previously tried: atorvastatin, pravastatin, simvastatin  -Current dietary patterns: *** -Current exercise habits: *** -Educated on {CCM HLD Counseling:25126} -{CCMPHARMDINTERVENTION:25122}  Atrial Fibrillation (Goal: prevent stroke and major bleeding) / Hypertension (BP goal <140/90) -Controlled -CHADS2VASc score: Age (2 points), Female sex (1 point), Hypertension history (1 point), and Stroke/TIA/Thromboembolism history (2 points) -Current treatment: Rate control: *** Anticoagulation: Xarelto 44m daily  -Medications previously tried: amlodipine, carvedilol, amiodarone, flecainide, metoprolol,  -Home BP and HR readings: ***  -Counseled on {CCMAFIBCOUNSELING:25120} -{CCMPHARMDINTERVENTION:25122}  Anxiety (Goal: Prevention of anxiety attacks) -{US controlled/uncontrolled:25276} -Current treatment: Lorazepam 0.575m- 1/2-1 tablet daily as needed  -Medications previously tried/failed: alprazolam, buspirone, diazepam, duloxetine, escitalopram, mirtazapine, clonazepam  -PHQ9: *** -GAD7: *** -Connected with *** for mental health support -Educated on {CCM mental health counseling:25127} -{CCMPHARMDINTERVENTION:25122}  Osteoporosis (Goal prevention of fractures / disease progression ) -Controlled -Last DEXA Scan: 09/16/2020   T-Score right femoral neck: -1.9  T-Score total right femur: -2.0  T-Score left femoral neck: -1.8  T-Score total left femur: -1.3  T-Score left radium: -2.4 -Current treatment  Prolia 6024mvery 6 months  Vitamin D3 2000 units daily  -Medications  previously tried: alendronate  -{Osteoporosis Counseling:23892} -{CCMPHARMDINTERVENTION:25122}  Hypothyroidism (Goal: Maintenance of euthyroid levels) -Controlled Lab Results  Component Value Date   TSH 1.43 12/12/2020  -Current treatment  Levothyroxine 61m80m 1 tablet daily  -Medications previously tried: ***  -{CCMPHARMDINTERVENTION:25122}  Chronic Low Back Pain (Goal: Pain management) -{US controlled/uncontrolled:25276} -Current treatment  Gabapentin 300mg26m capsule 3 times daily  APAP 500mg 58mtablet 4 times daily  -Medications previously tried: tramadol, butrans, celecoxib, norco, meloxicam, naproxen, oxycodone, baclofen, tizanidine, cyclobenzaprine, lyrica -{CCMPHARMDINTERVENTION:25122}   Health Maintenance -Vaccine gaps: *** -Current therapy:  Famotidine 20mg -74mablet daily  Vitamin B complex w/ Vit C - 1 tablet dialy  Cetirizine 10mg - 71mblet daily  Simethicone - 1 tablet daily  Montelukast 10mg dai88mPepto Bismol 262mg/ 65m58m55m64mery 35murs as needed  Diphenhydramine ***mg at bedtime  Librax 5-2.5mg - 1 caps102ms 3 times daily as needed  -Educated on {ccm supplement counseling:25128} -{CCM Patient satisfied:25129} -{CCMPHARMDINTERVENTION:25122}  Patient Goals/Self-Care Activities Patient will:  - {pharmacypatientgoals:24919}  Follow Up Plan: {CM FOLLOW UP PLAN:22241}NKNL:97673}

## 2021-08-12 ENCOUNTER — Telehealth: Payer: Self-pay | Admitting: Internal Medicine

## 2021-08-12 NOTE — Telephone Encounter (Signed)
Patient states she is having increasing back for x54m Patient states the back pain increase after sleeping overnight  Patient states she is taking Tylenol and Gabapentin w/ no relief  Patient is requesting a call back to discuss alternative medications she can take to help relieve pain until her ov on 08-28-2021

## 2021-08-13 NOTE — Telephone Encounter (Signed)
Alyssa Luna is intolerant of a lot of different meds.  Can she take more Tylenol and gabapentin?  How much is she taking now?  Thanks

## 2021-08-13 NOTE — Telephone Encounter (Signed)
If the pain  is new and substantial, it could be caused by a compression fracture.  Alyssa Luna needs to be seen and have x-rays hopefully locally so she does not have to drive far.  I can send her a pain medication if she tells [place] what she can tolerate, i.e. Norco.  Thank you

## 2021-08-14 MED ORDER — TRAMADOL HCL 50 MG PO TABS: 25.0000 mg | ORAL_TABLET | Freq: Three times a day (TID) | ORAL | 1 refills | Status: AC | PRN

## 2021-08-14 NOTE — Telephone Encounter (Signed)
Okay.  Continue other meds as before.  Try to add tramadol at 1/2 or 1 tablet 3 times a day as needed.  Prescription emailed.  Thanks

## 2021-08-14 NOTE — Telephone Encounter (Signed)
Pts states she rather try Tramadol instead of Norco or anything in that nature.

## 2021-08-25 NOTE — Telephone Encounter (Signed)
Prolia VOB initiated for 2023

## 2021-08-28 ENCOUNTER — Other Ambulatory Visit: Payer: Self-pay

## 2021-08-28 ENCOUNTER — Ambulatory Visit: Payer: Medicare PPO | Admitting: Internal Medicine

## 2021-08-28 ENCOUNTER — Encounter: Payer: Self-pay | Admitting: Internal Medicine

## 2021-08-28 VITALS — BP 168/72 | HR 44 | Temp 98.1°F

## 2021-08-28 DIAGNOSIS — R5382 Chronic fatigue, unspecified: Secondary | ICD-10-CM

## 2021-08-28 DIAGNOSIS — M810 Age-related osteoporosis without current pathological fracture: Secondary | ICD-10-CM

## 2021-08-28 DIAGNOSIS — M5440 Lumbago with sciatica, unspecified side: Secondary | ICD-10-CM | POA: Diagnosis not present

## 2021-08-28 DIAGNOSIS — G8929 Other chronic pain: Secondary | ICD-10-CM | POA: Diagnosis not present

## 2021-08-28 DIAGNOSIS — D649 Anemia, unspecified: Secondary | ICD-10-CM

## 2021-08-28 DIAGNOSIS — E538 Deficiency of other specified B group vitamins: Secondary | ICD-10-CM | POA: Diagnosis not present

## 2021-08-28 DIAGNOSIS — E785 Hyperlipidemia, unspecified: Secondary | ICD-10-CM

## 2021-08-28 LAB — COMPREHENSIVE METABOLIC PANEL
ALT: 18 U/L (ref 0–35)
AST: 26 U/L (ref 0–37)
Albumin: 4.3 g/dL (ref 3.5–5.2)
Alkaline Phosphatase: 92 U/L (ref 39–117)
BUN: 9 mg/dL (ref 6–23)
CO2: 31 mEq/L (ref 19–32)
Calcium: 9.9 mg/dL (ref 8.4–10.5)
Chloride: 104 mEq/L (ref 96–112)
Creatinine, Ser: 0.69 mg/dL (ref 0.40–1.20)
GFR: 78.83 mL/min (ref >60.00)
Glucose, Bld: 72 mg/dL (ref 70–99)
Potassium: 4.1 mEq/L (ref 3.5–5.1)
Sodium: 142 mEq/L (ref 135–145)
Total Bilirubin: 1 mg/dL (ref 0.2–1.2)
Total Protein: 6.9 g/dL (ref 6.0–8.3)

## 2021-08-28 LAB — CBC WITH DIFFERENTIAL/PLATELET
Basophils Absolute: 0.1 10*3/uL (ref 0.0–0.1)
Basophils Relative: 1.9 % (ref 0.0–3.0)
Eosinophils Absolute: 0 10*3/uL (ref 0.0–0.7)
Eosinophils Relative: 0.7 % (ref 0.0–5.0)
HCT: 41 % (ref 36.0–46.0)
Hemoglobin: 13.5 g/dL (ref 12.0–15.0)
Lymphocytes Relative: 45.1 % (ref 12.0–46.0)
Lymphs Abs: 1.6 10*3/uL (ref 0.7–4.0)
MCHC: 32.8 g/dL (ref 30.0–36.0)
MCV: 91.8 fl (ref 78.0–100.0)
Monocytes Absolute: 0.3 10*3/uL (ref 0.1–1.0)
Monocytes Relative: 7.8 % (ref 3.0–12.0)
Neutro Abs: 1.6 10*3/uL (ref 1.4–7.7)
Neutrophils Relative %: 44.5 % (ref 43.0–77.0)
Platelets: 169 10*3/uL (ref 150.0–400.0)
RBC: 4.47 Mil/uL (ref 3.87–5.11)
RDW: 12.9 % (ref 11.5–15.5)
WBC: 3.5 10*3/uL — ABNORMAL LOW (ref 4.0–10.5)

## 2021-08-28 LAB — TSH: TSH: 1.43 u[IU]/mL (ref 0.35–5.50)

## 2021-08-28 LAB — T4, FREE: Free T4: 1.23 ng/dL (ref 0.60–1.60)

## 2021-08-28 MED ORDER — CYANOCOBALAMIN 1000 MCG/ML IJ SOLN
1000.0000 ug | Freq: Once | INTRAMUSCULAR | Status: AC
  Administered 2021-08-28: 1000 ug via INTRAMUSCULAR

## 2021-08-28 NOTE — Progress Notes (Signed)
Subjective:  Patient ID: Alyssa Luna, female    DOB: [birth year]  Age: 88 y.o. MRN: [id number]  CC: Back Pain (Pt is also wanting B12 inj.. last one given in Dec. No B12 levels has been check over year)   HPI Alyssa Luna presents for chronic low back pain and thoracic back pain, vitamin B12 deficiency, dyslipidemia.  Outpatient Medications Prior to Visit  Medication Sig Dispense Refill   acetaminophen (TYLENOL) 325 MG tablet Take 1-2 tablets (325-650 mg total) by mouth every 6 (six) hours as needed for moderate pain. 100 tablet 3   B Complex-C (B-COMPLEX WITH VITAMIN C) tablet Take by mouth.     bismuth subsalicylate (PEPTO BISMOL) 262 MG/15ML suspension Take 30 mLs by mouth every 6 (six) hours as needed.     cetirizine (ZYRTEC) 10 MG tablet Take 10 mg by mouth daily as needed for allergies.     Cholecalciferol 50 MCG (2000 UT) TBDP Take by mouth daily.     clidinium-chlordiazePOXIDE (LIBRAX) 5-2.5 MG capsule Take 1 capsule by mouth 3 (three) times daily as needed. 60 capsule 2   Cyanocobalamin (VITAMIN B-12 IJ) Inject as directed every 30 (thirty) days.     denosumab (PROLIA) 60 MG/ML SOLN injection Inject 60 mg into the skin every 6 (six) months. Administer in upper arm, thigh, or abdomen Unable to tolerate oral meds Dx severe osteoporosis 1.8 mL 6   diphenhydrAMINE HCl (BENADRYL ALLERGY CHILDRENS PO) Take by mouth at bedtime as needed. Takes at night when itching     famotidine (PEPCID) 20 MG tablet TAKE ONE TABLET BY MOUTH TWICE A DAY 180 tablet 3   gabapentin (NEURONTIN) 600 MG tablet TAKE 1/2 TABLET BY MOUTH 4 TIMES A DAY     levothyroxine (SYNTHROID) 25 MCG tablet TAKE ONE TABLET BY MOUTH DAILY BEFORE BREAKFAST 90 tablet 3   LORazepam (ATIVAN) 0.5 MG tablet TAKE 1/2 TO 1 TABLET BY MOUTH DAILY AS NEEDED FOR ANXIETY OR FOR SLEEP 30 tablet 5   montelukast (SINGULAIR) 10 MG tablet TAKE ONE TABLET BY MOUTH DAILY 90 tablet 1   Rivaroxaban (XARELTO) 15 MG TABS tablet Take 15  mg by mouth daily.      Simethicone (GAS-X PO) Take 1 tablet by mouth daily as needed (flatulence).      acetaminophen (TYLENOL) 500 MG tablet Take by mouth. (Patient not taking: Reported on 08/28/2021)     cetirizine (ZYRTEC) 5 MG tablet Take by mouth. (Patient not taking: Reported on 08/28/2021)     famotidine (PEPCID) 20 MG tablet Take by mouth. (Patient not taking: Reported on 08/28/2021)     montelukast (SINGULAIR) 10 MG tablet Take by mouth. (Patient not taking: Reported on 08/28/2021)     No facility-administered medications prior to visit.    ROS: Review of Systems  Constitutional:  Positive for fatigue. Negative for activity change, appetite change, chills and unexpected weight change.  HENT:  Positive for voice change. Negative for congestion, mouth sores, sinus pressure and sore throat.   Eyes:  Negative for visual disturbance.  Respiratory:  Negative for cough and chest tightness.   Cardiovascular:  Negative for leg swelling.  Gastrointestinal:  Negative for abdominal pain and nausea.  Genitourinary:  Negative for difficulty urinating, frequency and vaginal pain.  Musculoskeletal:  Positive for arthralgias, back pain and gait problem.  Skin:  Negative for pallor and rash.  Neurological:  Negative for dizziness, tremors, weakness, numbness and headaches.  Psychiatric/Behavioral:  Positive for sleep disturbance. Negative for confusion.  The patient is nervous/anxious.    Objective:  BP (!) 168/72 (BP Location: Left Arm)    Pulse (!) 44    Temp 98.1 F (36.7 C) (Oral)    SpO2 97%   BP Readings from Last 3 Encounters:  08/28/21 (!) 168/72  06/11/21 130/62  04/03/21 130/82    Wt Readings from Last 3 Encounters:  06/11/21 123 lb 12.8 oz (56.2 kg)  03/10/21 127 lb 6.4 oz (57.8 kg)  01/17/21 128 lb (58.1 kg)    Physical Exam Constitutional:      General: She is not in acute distress.    Appearance: She is well-developed.  HENT:     Head: Normocephalic.     Right Ear:  External ear normal.     Left Ear: External ear normal.     Nose: Nose normal.  Eyes:     General:        Right eye: No discharge.        Left eye: No discharge.     Conjunctiva/sclera: Conjunctivae normal.     Pupils: Pupils are equal, round, and reactive to light.  Neck:     Thyroid: No thyromegaly.     Vascular: No JVD.     Trachea: No tracheal deviation.  Cardiovascular:     Rate and Rhythm: Normal rate and regular rhythm.     Heart sounds: Normal heart sounds.  Pulmonary:     Effort: No respiratory distress.     Breath sounds: No stridor. No wheezing.  Abdominal:     General: Bowel sounds are normal. There is no distension.     Palpations: Abdomen is soft. There is no mass.     Tenderness: There is no abdominal tenderness. There is no guarding or rebound.  Musculoskeletal:        General: Tenderness present.     Cervical back: Normal range of motion and neck supple. No rigidity.  Lymphadenopathy:     Cervical: No cervical adenopathy.  Skin:    Findings: No erythema or rash.  Neurological:     Cranial Nerves: No cranial nerve deficit.     Motor: No abnormal muscle tone.     Coordination: Coordination normal.     Deep Tendon Reflexes: Reflexes normal.  Psychiatric:        Behavior: Behavior normal.        Thought Content: Thought content normal.        Judgment: Judgment normal.   Kyphotic back The patient using a walker She is alert oriented and cooperative  Lab Results  Component Value Date   WBC 3.5 (L) 08/28/2021   HGB 13.5 08/28/2021   HCT 41.0 08/28/2021   PLT 169.0 08/28/2021   GLUCOSE 72 08/28/2021   CHOL 160 02/25/2018   TRIG 119.0 02/25/2018   HDL 42.90 02/25/2018   LDLDIRECT 178.0 09/24/2011   LDLCALC 94 02/25/2018   ALT 18 08/28/2021   AST 26 08/28/2021   NA 142 08/28/2021   K 4.1 08/28/2021   CL 104 08/28/2021   CREATININE 0.69 08/28/2021   BUN 9 08/28/2021   CO2 31 08/28/2021   TSH 1.43 08/28/2021   INR 1.3 (H) 02/07/2019    No  results found.  Assessment & Plan:   Problem List Items Addressed This Visit     Anemia - Primary   Relevant Orders   CBC with Differential/Platelet (Completed)   Comprehensive metabolic panel (Completed)   TSH (Completed)   T4, free (Completed)   B12 deficiency  Continue with Vitamin B12 injections      Dyslipidemia   Relevant Orders   CBC with Differential/Platelet (Completed)   Comprehensive metabolic panel (Completed)   TSH (Completed)   T4, free (Completed)   Fatigue    Chronic fatigue.  Discussed.      LOW BACK PAIN    Chronic and refractory   Use heat/ice  Turmeric, Black Cherry extract, Magnesium tablets for pain  TENS unit   Back Massager with Heat, Snailax Shiatsu Massage Chair Pad for Back Pain, Rolling Kneading Massage Seat Cushion  Memory foam matrass top      Relevant Orders   CBC with Differential/Platelet (Completed)   Comprehensive metabolic panel (Completed)   TSH (Completed)   T4, free (Completed)   Osteoporosis    Continue on Prolia         Meds ordered this encounter  Medications   cyanocobalamin ((VITAMIN B-12)) injection 1,000 mcg      Follow-up: No follow-ups on file.  Walker Kehr, MD

## 2021-08-28 NOTE — Patient Instructions (Addendum)
TENS unit    For a mild COVID-19 case - take zinc 50 mg a day for 1 week, vitamin C 1000 mg daily for 1 week, vitamin D2 50,000 units weekly for 2 months (unless  taking vitamin D daily already), an antioxidant Quercetin 500 mg twice a day for 1 week (if you can get it quick enough). Take Allegra or Benadryl.  Maintain good oral hydration and take Tylenol for high fever.  Call if problems. Isolate for 5 days, then wear a mask for 5 days per CDC.

## 2021-08-28 NOTE — Assessment & Plan Note (Addendum)
Chronic and refractory   Use heat/ice  Turmeric, Black Cherry extract, Magnesium tablets for pain  TENS unit   Back Massager with Heat, Snailax Shiatsu Massage Chair Pad for Back Pain, Rolling Kneading Massage Seat Cushion  Memory foam matrass top

## 2021-08-30 NOTE — Telephone Encounter (Signed)
Prior auth required for Prolia  PA PROCESS DETAILS: PA is required. PA can be initiated by calling [phone] or online at [email or web address]

## 2021-08-31 ENCOUNTER — Encounter: Payer: Self-pay | Admitting: Internal Medicine

## 2021-08-31 NOTE — Assessment & Plan Note (Signed)
Chronic fatigue.  Discussed.

## 2021-08-31 NOTE — Assessment & Plan Note (Signed)
Continue with Vitamin B12 injections

## 2021-08-31 NOTE — Assessment & Plan Note (Signed)
Continue on Prolia

## 2021-09-01 ENCOUNTER — Other Ambulatory Visit: Payer: Self-pay | Admitting: Internal Medicine

## 2021-09-02 ENCOUNTER — Other Ambulatory Visit: Payer: Self-pay | Admitting: Internal Medicine

## 2021-09-02 NOTE — Telephone Encounter (Signed)
Prior auth initiated via [email or web address] KEY: [id number]

## 2021-09-08 ENCOUNTER — Telehealth: Payer: Self-pay | Admitting: Internal Medicine

## 2021-09-08 NOTE — Telephone Encounter (Signed)
Patient states her bp has been elevated w/ a reading of 179/84  Offered patient an appt w/ provider for tomorrow 1-23, patient declined stating [birth year]'s her birthday and she will be going out of town  Patient scheduled for 1-30

## 2021-09-08 NOTE — Telephone Encounter (Signed)
Patient reschedule 1-30 appt to 1-24

## 2021-09-09 ENCOUNTER — Ambulatory Visit: Payer: Medicare PPO | Admitting: Internal Medicine

## 2021-09-09 ENCOUNTER — Other Ambulatory Visit: Payer: Self-pay

## 2021-09-09 ENCOUNTER — Encounter: Payer: Self-pay | Admitting: Internal Medicine

## 2021-09-09 DIAGNOSIS — I10 Essential (primary) hypertension: Secondary | ICD-10-CM | POA: Diagnosis not present

## 2021-09-09 DIAGNOSIS — E538 Deficiency of other specified B group vitamins: Secondary | ICD-10-CM | POA: Diagnosis not present

## 2021-09-09 DIAGNOSIS — I4891 Unspecified atrial fibrillation: Secondary | ICD-10-CM | POA: Diagnosis not present

## 2021-09-09 MED ORDER — LOSARTAN POTASSIUM 25 MG PO TABS: 25.0000 mg | ORAL_TABLET | Freq: Two times a day (BID) | ORAL | 11 refills | Status: DC

## 2021-09-09 NOTE — Progress Notes (Signed)
Subjective:  Patient ID: Alyssa Luna, female    DOB: 1934/06/19  Age: 88 y.o. MRN: [id number]  CC: Hypertension (Pt states her BP been running high. She doesn't feel right.. feel very tired)   HPI Alyssa Luna presents for elevated BP. SBP 156-178 at home  Outpatient Medications Prior to Visit  Medication Sig Dispense Refill   acetaminophen (TYLENOL) 325 MG tablet Take 1-2 tablets (325-650 mg total) by mouth every 6 (six) hours as needed for moderate pain. 100 tablet 3   B Complex-C (B-COMPLEX WITH VITAMIN C) tablet Take by mouth.     bismuth subsalicylate (PEPTO BISMOL) 262 MG/15ML suspension Take 30 mLs by mouth every 6 (six) hours as needed.     cetirizine (ZYRTEC) 10 MG tablet Take 10 mg by mouth daily as needed for allergies.     Cholecalciferol 50 MCG (2000 UT) TBDP Take by mouth daily.     clidinium-chlordiazePOXIDE (LIBRAX) 5-2.5 MG capsule Take 1 capsule by mouth 3 (three) times daily as needed. 60 capsule 2   Cyanocobalamin (VITAMIN B-12 IJ) Inject as directed every 30 (thirty) days.     denosumab (PROLIA) 60 MG/ML SOLN injection Inject 60 mg into the skin every 6 (six) months. Administer in upper arm, thigh, or abdomen Unable to tolerate oral meds Dx severe osteoporosis 1.8 mL 6   diphenhydrAMINE HCl (BENADRYL ALLERGY CHILDRENS PO) Take by mouth at bedtime as needed. Takes at night when itching     famotidine (PEPCID) 20 MG tablet TAKE ONE TABLET BY MOUTH TWICE A DAY 180 tablet 3   gabapentin (NEURONTIN) 600 MG tablet TAKE 1/2 TABLET BY MOUTH 4 TIMES A DAY     levothyroxine (SYNTHROID) 25 MCG tablet TAKE ONE TABLET BY MOUTH DAILY BEFORE BREAKFAST 90 tablet 3   LORazepam (ATIVAN) 0.5 MG tablet TAKE 1/2 TO 1 TABLET BY MOUTH DAILY AS NEEDED FOR ANXIETY OR FOR SLEEP 30 tablet 5   montelukast (SINGULAIR) 10 MG tablet TAKE ONE TABLET BY MOUTH DAILY 90 tablet 1   Rivaroxaban (XARELTO) 15 MG TABS tablet Take 15 mg by mouth daily.      Simethicone (GAS-X PO) Take 1 tablet  by mouth daily as needed (flatulence).      No facility-administered medications prior to visit.    ROS: Review of Systems  Constitutional:  Positive for fatigue. Negative for activity change, appetite change, chills and unexpected weight change.  HENT:  Negative for congestion, mouth sores and sinus pressure.   Eyes:  Negative for visual disturbance.  Respiratory:  Negative for cough and chest tightness.   Gastrointestinal:  Negative for abdominal pain and nausea.  Genitourinary:  Negative for difficulty urinating, frequency and vaginal pain.  Musculoskeletal:  Positive for arthralgias. Negative for back pain and gait problem.  Skin:  Negative for pallor and rash.  Neurological:  Positive for dizziness and weakness. Negative for tremors, numbness and headaches.  Psychiatric/Behavioral:  Negative for confusion, sleep disturbance and suicidal ideas.    Objective:  BP (!) 178/82 (BP Location: Left Arm)    Pulse (!) 52    Temp 97.9 F (36.6 C) (Oral)    SpO2 96%   BP Readings from Last 3 Encounters:  09/09/21 (!) 178/82  08/28/21 (!) 168/72  06/11/21 130/62    Wt Readings from Last 3 Encounters:  06/11/21 123 lb 12.8 oz (56.2 kg)  03/10/21 127 lb 6.4 oz (57.8 kg)  01/17/21 128 lb (58.1 kg)    Physical Exam Constitutional:  General: She is not in acute distress.    Appearance: She is well-developed.  HENT:     Head: Normocephalic.     Right Ear: External ear normal.     Left Ear: External ear normal.     Nose: Nose normal.  Eyes:     General:        Right eye: No discharge.        Left eye: No discharge.     Conjunctiva/sclera: Conjunctivae normal.     Pupils: Pupils are equal, round, and reactive to light.  Neck:     Thyroid: No thyromegaly.     Vascular: No JVD.     Trachea: No tracheal deviation.  Cardiovascular:     Rate and Rhythm: Normal rate and regular rhythm.     Heart sounds: Normal heart sounds.  Pulmonary:     Effort: No respiratory distress.      Breath sounds: No stridor. No wheezing.  Abdominal:     General: Bowel sounds are normal. There is no distension.     Palpations: Abdomen is soft. There is no mass.     Tenderness: There is no abdominal tenderness. There is no guarding or rebound.  Musculoskeletal:        General: No swelling or tenderness.     Cervical back: Normal range of motion and neck supple. No rigidity.  Lymphadenopathy:     Cervical: No cervical adenopathy.  Skin:    Findings: No erythema or rash.  Neurological:     Cranial Nerves: No cranial nerve deficit.     Motor: No abnormal muscle tone.     Coordination: Coordination normal.     Deep Tendon Reflexes: Reflexes normal.  Psychiatric:        Behavior: Behavior normal.        Thought Content: Thought content normal.        Judgment: Judgment normal.    Lab Results  Component Value Date   WBC 3.5 (L) 08/28/2021   HGB 13.5 08/28/2021   HCT 41.0 08/28/2021   PLT 169.0 08/28/2021   GLUCOSE 72 08/28/2021   CHOL 160 02/25/2018   TRIG 119.0 02/25/2018   HDL 42.90 02/25/2018   LDLDIRECT 178.0 09/24/2011   LDLCALC 94 02/25/2018   ALT 18 08/28/2021   AST 26 08/28/2021   NA 142 08/28/2021   K 4.1 08/28/2021   CL 104 08/28/2021   CREATININE 0.69 08/28/2021   BUN 9 08/28/2021   CO2 31 08/28/2021   TSH 1.43 08/28/2021   INR 1.3 (H) 02/07/2019    No results found.  Assessment & Plan:   Problem List Items Addressed This Visit     Atrial fibrillation ([place])    On Xarelto      Relevant Medications   losartan (COZAAR) 25 MG tablet   B12 deficiency    On B12      Essential hypertension    Worse Will try Losartan 25  Po bid again        Relevant Medications   losartan (COZAAR) 25 MG tablet      Meds ordered this encounter  Medications   DISCONTD: losartan (COZAAR) 25 MG tablet    Sig: Take 1 tablet (25 mg total) by mouth in the morning and at bedtime.    Dispense:  60 tablet    Refill:  11   losartan (COZAAR) 25 MG tablet    Sig:  Take 1 tablet (25 mg total) by mouth in the morning and  at bedtime.    Dispense:  60 tablet    Refill:  11    Multple allergies      Follow-up: Return in about 4 weeks (around 10/07/2021) for a follow-up visit.  Walker Kehr, MD

## 2021-09-09 NOTE — Telephone Encounter (Signed)
Pt ready for scheduling on or after 08/17/21  Out-of-pocket cost due at time of visit: $0  Primary: [employer] Prolia co-insurance: 0% Admin fee co-insurance: 0%  Secondary: n/a Prolia co-insurance:  Admin fee co-insurance:   Deductible: does not apply  Prior Auth: APPROVED PA# [id number] Valid: 08/17/21-08/16/22    ** This summary of benefits is an estimation of the patient's out-of-pocket cost. Exact cost may very based on individual plan coverage.

## 2021-09-09 NOTE — Assessment & Plan Note (Addendum)
Worse Will try Losartan 25 mg po bid again. Start w/12.5 mg bid

## 2021-09-09 NOTE — Assessment & Plan Note (Signed)
On Xarelto 

## 2021-09-09 NOTE — Assessment & Plan Note (Signed)
On B12 

## 2021-09-09 NOTE — Patient Instructions (Signed)
Worse Will try Losartan 25  Po bid again

## 2021-09-11 ENCOUNTER — Ambulatory Visit: Payer: Medicare PPO | Admitting: Internal Medicine

## 2021-09-15 ENCOUNTER — Ambulatory Visit: Payer: Medicare PPO | Admitting: Internal Medicine

## 2021-09-24 ENCOUNTER — Ambulatory Visit (INDEPENDENT_AMBULATORY_CARE_PROVIDER_SITE_OTHER): Payer: Medicare PPO

## 2021-09-24 DIAGNOSIS — I4891 Unspecified atrial fibrillation: Secondary | ICD-10-CM

## 2021-09-24 DIAGNOSIS — I10 Essential (primary) hypertension: Secondary | ICD-10-CM

## 2021-09-24 DIAGNOSIS — M810 Age-related osteoporosis without current pathological fracture: Secondary | ICD-10-CM

## 2021-09-24 DIAGNOSIS — E785 Hyperlipidemia, unspecified: Secondary | ICD-10-CM

## 2021-09-24 NOTE — Patient Instructions (Addendum)
Visit Information   Following is a copy of your full care plan:  Care Plan : [place]  Updates made by Tomasa Blase, RPH since 09/24/2021 12:00 AM     Problem: HTN, Afib, Hypothyroidism, Osteoporosis, Anxiety, Insomnia, HLD   Priority: High  Onset Date: 09/24/2021     Long-Range Goal: Disease Management   Start Date: 09/24/2021  Expected End Date: 09/24/2022  This Visit's Progress: On track  Priority: High  Note:   Current Barriers:  Unable to independently monitor therapeutic efficacy Does not adhere to prescribed medication regimen Does not contact provider office for questions/concerns  Pharmacist Clinical Goal(s):  Patient will achieve adherence to monitoring guidelines and medication adherence to achieve therapeutic efficacy achieve control of blood pressure as evidenced by BP's <140/90 through collaboration with PharmD and provider.   Interventions: 1:1 collaboration with Plotnikov, Evie Lacks, MD regarding development and update of comprehensive plan of care as evidenced by provider attestation and co-signature Inter-disciplinary care team collaboration (see longitudinal plan of care) Comprehensive medication review performed; medication list updated in electronic medical record  Hypertension (BP goal <140/90) -Controlled -Current treatment: Losartan 61m BID  -Medications previously tried: amlodipine, coreg, furosemide, metoprolol, telmisartan   -Current home readings: elevated at 179/86, 148/93, 179/77, 183/83, 158/82, 155/86 -Current dietary habits: tries to watch sodium intake as much as she is able to -Current exercise habits: limited at this time  -Denies hypotensive/hypertensive symptoms -Educated on BP goals and benefits of medications for prevention of heart attack, stroke and kidney damage; Daily salt intake goal < 2300 mg; Exercise goal of 150 minutes per week; Importance of home blood pressure monitoring; Proper BP monitoring  technique; Symptoms of hypotension and importance of maintaining adequate hydration; -Counseled to monitor BP at home daily, document, and provide log at future appointments -Recommended for patient to restart losartan at 12.571mdaily   Atrial Fibrillation (Goal: prevent stroke and major bleeding) -Controlled CHADS2VASc score: Age (2 points), Female sex (1 point), Hypertension history (1 point), and Stroke/TIA/Thromboembolism history (2 points) -Current treatment: Xarelto 1016maily  -Medications previously tried: warfarin, metoprolol tartrate, flecainide, coreg, amiodarone -Home BP and HR readings: elevated at 179/86, 148/93, 179/77, 183/83, 158/82, 155/86, HR of 46, 57, 58, 48, 61  -Counseled on increased risk of stroke due to Afib and benefits of anticoagulation for stroke prevention; importance of adherence to anticoagulant exactly as prescribed; bleeding risk associated with xarelto and importance of self-monitoring for signs/symptoms of bleeding; avoidance of NSAIDs due to increased bleeding risk with anticoagulants; seeking medical attention after a head injury or if there is blood in the urine/stool; -Recommended to continue current medication  Anxiety/ Insomnia (Goal: Prevention of Anxiety Attacks / Promotion of Quality Sleep) -Controlled  -Current treatment: Lorazepam 0.5mg75m1/2-1 tablet nightly as needed - not taking at this time as she threw most recent medication out - denies any issues since stopping (has been without for about 2 weeks) -Medications previously tried/failed: alprazolam, diazepam, buspirone, duloxetine, escitalopram, mirtazapine -GAD7: not assessed today  -Educated on Benefits of medication for symptom control Benefits of cognitive-behavioral therapy with or without medication -Recommended for patient to trial going without lorazepam as she has been able to do for the past 2 weeks without issue, patient will discuss with PCP at next appointment  -As she has  been without for 2 weeks, believe it is safe to continue without at this time   Osteoporosis (Goal Prevention of fractures) -Controlled - most recent dexa scan improved, but is  not currently on prolia due to dental issues  -Last DEXA Scan: 09/16/2020   T-Score right femoral neck: -1.9  T-Score left femoral neck: -1.8  T-Score right total femur: -2.0  T-Score left total femur: -1.3  T-Score left forearm radius: -2.4 -Current treatment  Prolia - 54m every 6 months  Vitamin D3 2000 units daily  -Medications previously tried: alendronate  -Recommended to continue current medication -Patient has follow up appointment with dentist 09/30/2021 to discuss restarting prolia   Hypothyroidism (Goal: Maintenance of euthyroid levels) -Controlled Lab Results  Component Value Date   TSH 1.43 08/28/2021  -Current treatment  Levothyroxine 222m daily  -Medications previously tried: n/a  -Recommended to continue current medication  Hyperlipidemia: (LDL goal < 70) - History of TIA  -Not ideally controlled Lab Results  Component Value Date   LDLCALC 94 02/25/2018  -Current treatment: N/a -Medications previously tried: Atorvastatin, Simvastatin, pravastatin  -Current dietary patterns: tries to limit high cholesterol foods -Current exercise habits: none at this time  -Educated on Cholesterol goals;  Importance of limiting foods high in cholesterol; -Recommended for updated lipid panel, should LDL be elevated from goal, discussion of trial of new lipid lower agent such as zetia, nexletol, or PCSK9 inhibitor depending on level of LDL   Health Maintenance -Vaccine gaps: COVID booster  -Current therapy:  Biotin 50m66maily  Cetirizine 34m26mily  Famotidine 20mg62m  Gas-X as needed  Diphenhydramine 12.5mg h9mrn  APAP 325mg q89mrn  -Educated on Cost vs benefit of each product must be carefully weighed by individual consumer -Patient is satisfied with current therapy and denies  issues -Recommended to continue current medication  Patient Goals/Self-Care Activities Patient will:  - take medications as prescribed as evidenced by patient report and record review check blood pressure daily, document, and provide at future appointments  Follow Up Plan: Telephone follow up appointment with care management team member scheduled for: 3 months  The patient has been provided with contact information for the care management team and has been advised to call with any health related questions or concerns.      Consent to CCM Services: Ms. PhillipHatteryven information about Chronic Care Management services including:  CCM service includes personalized support from designated clinical staff supervised by her physician, including individualized plan of care and coordination with other care providers 24/7 contact phone numbers for assistance for urgent and routine care needs. Service will only be billed when office clinical staff spend 20 minutes or more in a month to coordinate care. Only one practitioner may furnish and bill the service in a calendar month. The patient may stop CCM services at any time (effective at the end of the month) by phone call to the office staff. The patient will be responsible for cost sharing (co-pay) of up to 20% of the service fee (after annual deductible is met).  Patient agreed to services and verbal consent obtained.   Plan: Telephone follow up appointment with care management team member scheduled for:  3 months The patient has been provided with contact information for the care management team and has been advised to call with any health related questions or concerns.   Tytiana Coles Tomasa BlaseD Clinical Pharmacist, [facility] call the care guide team at [phone] need to cancel or reschedule your appointment.   Patient verbalizes understanding of instructions and care plan provided today and agrees to view in  [place]Chart[place] MyChart status confirmed with patient.

## 2021-09-24 NOTE — Progress Notes (Addendum)
Chronic Care Management Pharmacy Note  09/24/2021 Name:  Alyssa Luna MRN:  [id number] DOB:  10/28/1933  Summary: -Patient reports that she had stopped taking losartan after 1 day due to feeling different when taken - feeling shaky- reports that she has been taking blood pressures over the weekend - elevated at 179/86, 148/93, 179/77, 183/83, 158/82, 155/86 -Patient is not taking lorazepam at this time as she accidentally threw away her bottle of medication - has been without for 2 weeks, denies any issues since stopping  -Is not currently using prolia  due to dental issues - has follow up with dentist 2/14 to discuss restarting prolia -Had initial appointment with Dr. Shan Levans - planning to transition care to her in the near future as [place]   Recommendations/Changes made from today's visit: -Recommended for patient to retrial losartan at lower dose - patient was agreeable to restart at 12.41m daily - reviewed with patient BP goal of <140/90 - to reach out should BP remain elevated / if she has issues with losartan - has follow up with PCP 10/08/21  Plan: - Follow up in 3 months   Subjective: Alyssa HICKAMis an 88y.o. year old female who is a primary patient of Plotnikov, AEvie Lacks MD.  The CCM team was consulted for assistance with disease management and care coordination needs.    Engaged with patient by telephone for initial visit in response to provider referral for pharmacy case management and/or care coordination services.   Consent to Services:  The patient was given the following information about Chronic Care Management services today, agreed to services, and gave verbal consent: 1. CCM service includes personalized support from designated clinical staff supervised by the primary care provider, including individualized plan of care and coordination with other care providers 2. 24/7 contact phone numbers for assistance for urgent and routine care  needs. 3. Service will only be billed when office clinical staff spend 20 minutes or more in a month to coordinate care. 4. Only one practitioner may furnish and bill the service in a calendar month. 5.The patient may stop CCM services at any time (effective at the end of the month) by phone call to the office staff. 6. The patient will be responsible for cost sharing (co-pay) of up to 20% of the service fee (after annual deductible is met). Patient agreed to services and consent obtained.  Patient Care Team: Plotnikov, AEvie Lacks MD as PCP - General Gottsegen, DCherly Anderson MD (Inactive) (Obstetrics and Gynecology) WElsie Saas MD (Orthopedic Surgery) GCrista Luria MD (Dermatology) TEvans Lance MD (Cardiology) HEunice Blase MD as Attending Physician (Family Medicine) [facility] VEarlean Polka MD (Neurology) Ward, CJeani Hawking MD as Referring Physician (Internal Medicine) TMilas Gain, MD (Inactive) as Referring Physician (Neurosurgery) Ward, CJeani Hawking MD as Referring Physician (Internal Medicine) STomasa Blase RTenn[facility] Healthcare - [facility]as Pharmacist (Pharmacist)  Recent office visits: 09/09/2021 - Dr. PAlain Marion- elevated BP - losartan 261mBID - f/u in 4 weeks  08/28/2021 - Dr. PlAlain Marion back pain - APAP [id number] prn  06/11/2021 - Dr. PloAlain Marionchronic lower back pain - turmeric/ magnesium, black cherry extract for pain - flu shot given, follow up in 3 months  04/03/2021 - Dr. PloAlain MarionGI issues - librax prn - follow up in 3 months   Recent consult visits: 07/25/2021 - Dr. GalElisha PonderPodiatry - follow up in 3 months  07/04/2021 - Dr. WinDenita Lungmily Medicine  -  established care - advised for wean of lorazepam and decreasing gabapentin  06/25/2021 - Dr. Zigmund Gottron [facility] Allergy - no changes to medications - follow up in spring 2023 05/07/2021 - Dr. Edwin Dada - Cardiology - no recurrent of Afib - on DOAC - no changes to medications - follow up in 1 year  03/26/2021 - Thressa Sheller PA-C - Ortho - left  hand/wrist pain - no medication changes follow up as needed    Hospital visits: 03/25/2021 - Urgent Care - left wrist / hand pain  - redness and swelling x 2 days - no obvious fracture noted - referred to [facility]   Objective:  Lab Results  Component Value Date   CREATININE 0.69 08/28/2021   BUN 9 08/28/2021   GFR 78.83 08/28/2021   GFRNONAA 50 (L) 04/27/2018   GFRAA 58 (L) 04/27/2018   NA 142 08/28/2021   K 4.1 08/28/2021   CALCIUM 9.9 08/28/2021   CO2 31 08/28/2021   GLUCOSE 72 08/28/2021    Lab Results  Component Value Date/Time   GFR 78.83 08/28/2021 11:58 AM   GFR 73.81 01/17/2021 12:05 PM    Last diabetic Eye exam:  No results found for: HMDIABEYEEXA  Last diabetic Foot exam:  No results found for: HMDIABFOOTEX   Lab Results  Component Value Date   CHOL 160 02/25/2018   HDL 42.90 02/25/2018   LDLCALC 94 02/25/2018   LDLDIRECT 178.0 09/24/2011   TRIG 119.0 02/25/2018   CHOLHDL 4 02/25/2018    Hepatic Function Latest Ref Rng & Units 08/28/2021 01/17/2021 12/12/2020  Total Protein 6.0 - 8.3 g/dL 6.9 6.9 6.9  Albumin 3.5 - 5.2 g/dL 4.3 4.2 4.1  AST 0 - 37 U/L _0 ALT 0 - 35 U/L _1 Alk Phosphatase 39 - 117 U/L 92 63 55  Total Bilirubin 0.2 - 1.2 mg/dL 1.0 0.9 0.8  Bilirubin, Direct 0.0 - 0.3 mg/dL - - -    Lab Results  Component Value Date/Time   TSH 1.43 08/28/2021 11:58 AM   TSH 1.43 12/12/2020 03:38 PM   FREET4 1.23 08/28/2021 11:58 AM   FREET4 1.01 12/12/2020 03:38 PM    CBC Latest Ref Rng & Units 08/28/2021 01/17/2021 12/12/2020  WBC 4.0 - 10.5 K/uL 3.5(L) 4.0 4.4  Hemoglobin 12.0 - 15.0 g/dL 13.5 14.1 14.0  Hematocrit 36.0 - 46.0 % 41.0 40.8 40.3  Platelets 150.0 - 400.0 K/uL 169.0 181.0 173.0    Lab Results  Component Value Date/Time   VD25OH 29.31 (L) 06/22/2017 02:23 PM   VD25OH 32.53 09/18/2015 03:43 PM    Clinical ASCVD: Yes  The ASCVD Risk score (Arnett DK, et al., 2019) failed to calculate for the following reasons:   The  2019 ASCVD risk score is only valid for ages 64 to 81    Depression screen PHQ 2/9 07/07/2021 07/07/2021 04/17/2020  Decreased Interest 0 0 0  Down, Depressed, Hopeless 0 0 0  PHQ - 2 Score 0 0 0  Altered sleeping - - -  Tired, decreased energy - - -  Change in appetite - - -  Feeling bad or failure about yourself  - - -  Trouble concentrating - - -  Moving slowly or fidgety/restless - - -  Suicidal thoughts - - -  PHQ-9 Score - - -  Difficult doing work/chores - - -  Some recent data might be hidden     Social History   Tobacco Use  Smoking Status Never  Smokeless Tobacco Never   BP Readings from Last 3 Encounters:  09/09/21 (!) 178/82  08/28/21 (!) 168/72  06/11/21 130/62   Pulse Readings from Last 3 Encounters:  09/09/21 (!) 52  08/28/21 (!) 44  06/11/21 67   Wt Readings from Last 3 Encounters:  06/11/21 123 lb 12.8 oz (56.2 kg)  03/10/21 127 lb 6.4 oz (57.8 kg)  01/17/21 128 lb (58.1 kg)   BMI Readings from Last 3 Encounters:  06/11/21 22.64 kg/m  03/10/21 23.30 kg/m  01/17/21 23.41 kg/m    Assessment/Interventions: Review of patient past medical history, allergies, medications, health status, including review of consultants reports, laboratory and other test data, was performed as part of comprehensive evaluation and provision of chronic care management services.   SDOH:  (Social Determinants of Health) assessments and interventions performed: Yes  SDOH Screenings   Alcohol Screen: Low Risk    Last Alcohol Screening Score (AUDIT): 0  Depression (PHQ2-9): Low Risk    PHQ-2 Score: 0  Financial Resource Strain: Low Risk    Difficulty of Paying Living Expenses: Not hard at all  Food Insecurity: No Food Insecurity   Worried About Charity fundraiser in the Last Year: Never true   Ran Out of Food in the Last Year: Never true  Housing: Low Risk    Last Housing Risk Score: 0  Physical Activity: Inactive   Days of Exercise per Week: 0 days   Minutes of  Exercise per Session: 0 min  Social Connections: Moderately Isolated   Frequency of Communication with Friends and Family: Twice a week   Frequency of Social Gatherings with Friends and Family: Twice a week   Attends Religious Services: More than 4 times per year   Active Member of [employer] or Organizations: No   Attends Archivist Meetings: Never   Marital Status: Widowed  Stress: No Stress Concern Present   Feeling of Stress : Not at all  Tobacco Use: Low Risk    Smoking Tobacco Use: Never   Smokeless Tobacco Use: Never   Passive Exposure: Not on file  Transportation Needs: No Transportation Needs   Lack of Transportation (Medical): No   Lack of Transportation (Non-Medical): No    CCM Care Plan  Allergies  Allergen Reactions   Alpha-Gal Itching and Nausea Only    Per patient, cannot have alpha-gal since tic bite episode. Per patient, cannot have alpha-gal since tic bite episode. Per patient, cannot have alpha-gal since tic bite episode.   Beef (Bovine) Protein Other (See Comments)    Alpha-gal allergy testing positive June 2018   Galactose Nausea Only    Per patient, cannot have alpha-gal since tic bite episode.   Pravastatin Anaphylaxis and Other (See Comments)    Legs ache   Lac Bovis Nausea Only and Rash   Alpha-D-Galactosidase Itching   Amlodipine     Leg swelling   Amlodipine-Atorvastatin     swelling   Cefdinir     Nausea from suspension   Colchicine Other (See Comments)   Doxycycline Other (See Comments)    Nausea - able to take w/food   Shellfish Allergy Other (See Comments)    Unknown   Tikosyn [Dofetilide] Other (See Comments)    Unknown reaction and was told never to take it due to problems during sleep    Actonel [Risedronate Sodium] Other (See Comments)    Not known   Alendronate Other (See Comments)    NOT KNOWN   Augmentin [Amoxicillin-Pot Clavulanate] Rash  Has patient had a PCN reaction causing immediate rash, facial/tongue/throat  swelling, SOB or lightheadedness with hypotension: Yes Has patient had a PCN reaction causing severe rash involving mucus membranes or skin necrosis:No Has patient had a PCN reaction that required hospitalization: No Has patient had a PCN reaction occurring within the last 10 years: No If all of the above answers are "NO", then may proceed with Cephalosporin use.    Beef Allergy Rash   Cheese Rash   Ciprofloxacin Nausea Only   Evista [Raloxifene] Other (See Comments)    Unknown reaction   Flagyl [Metronidazole] Other (See Comments)    Not known   Fosamax [Alendronate Sodium] Other (See Comments)    NOT KNOWN   Gold-Containing Drug Products Other (See Comments)    Per Dr   Loma Sousa [Escitalopram Oxalate] Other (See Comments)    shaky   Other Rash   Pork Allergy Rash   Risedronate Other (See Comments)    Unknown reaction   Simvastatin Other (See Comments)    REACTION: leg cramps, weakness   Tramadol Other (See Comments)    Mental disturbance changes    Medications Reviewed Today     Reviewed by Tomasa Blase, [facility] (Pharmacist) on 09/24/21 at [place] Status: <None>   Medication Order Taking? Sig Documenting Provider Last Dose Status Informant  acetaminophen (TYLENOL) 325 MG tablet [id number] Yes Take 1-2 tablets (325-650 mg total) by mouth every 6 (six) hours as needed for moderate pain. Plotnikov, Evie Lacks, MD Taking Active   BIOTIN PO [id number] Yes Take 8 mg by mouth daily. [provider] Taking Active   bismuth subsalicylate (PEPTO BISMOL) 262 MG/15ML suspension [id number] Yes Take 30 mLs by mouth every 6 (six) hours as needed. [provider] Taking Active   cetirizine (ZYRTEC) 10 MG tablet [id number] Yes Take 5 mg by mouth in the morning and at bedtime. [provider] Taking Active Multiple Informants  Cholecalciferol 50 MCG (2000 UT) TBDP [id number] Yes Take by mouth daily. [provider] Taking Active   Cyanocobalamin (VITAMIN  B-12 IJ) [id number] Yes Inject as directed every 30 (thirty) days. [provider] Taking Active Multiple Informants           Med Note Duffy Bruce, KATHY N   Wed Apr 27, 2018  6:18 AM)    denosumab (PROLIA) 60 MG/ML SOLN injection [id number] No Inject 60 mg into the skin every 6 (six) months. Administer in upper arm, thigh, or abdomen Unable to tolerate oral meds Dx severe osteoporosis  Patient not taking: Reported on 09/24/2021   Plotnikov, Evie Lacks, MD Not Taking Active Multiple Informants  diphenhydrAMINE (BENADRYL ALLERGY CHILDRENS) 12.5 MG chewable tablet [id number] Yes Chew 12.5 mg by mouth at bedtime as needed for itching. [provider] Taking Active   EPINEPHrine 0.3 mg/0.3 mL IJ SOAJ injection [id number] Yes Inject 0.3 mg into the muscle as needed for anaphylaxis. [provider]  Active   famotidine (PEPCID) 20 MG tablet [id number] Yes TAKE ONE TABLET BY MOUTH TWICE A DAY Plotnikov, Evie Lacks, MD Taking Active   gabapentin (NEURONTIN) 600 MG tablet [id number] Yes TAKE 1/2 TABLET BY MOUTH 4 TIMES A DAY [provider] Taking Active   levothyroxine (SYNTHROID) 25 MCG tablet [id number] Yes TAKE ONE TABLET BY MOUTH DAILY BEFORE BREAKFAST Plotnikov, Evie Lacks, MD Taking Active   LORazepam (ATIVAN) 0.5 MG tablet [id number] No TAKE 1/2 TO 1 TABLET BY MOUTH DAILY AS NEEDED FOR ANXIETY OR FOR SLEEP  Patient not  taking: Reported on 09/24/2021   Plotnikov, Evie Lacks, MD Not Taking Active   losartan (COZAAR) 25 MG tablet [id number] No Take 1 tablet (25 mg total) by mouth in the morning and at bedtime.  Patient not taking: Reported on 09/24/2021   Plotnikov, Evie Lacks, MD Not Taking Active   montelukast (SINGULAIR) 10 MG tablet [id number] Yes Take 10 mg by mouth at bedtime. [provider] Taking Active   Rivaroxaban (XARELTO) 15 MG TABS tablet [id number] Yes Take 15 mg by mouth daily.  [provider] Taking Active Multiple Informants           Med Note Ok Anis Apr 27, 2018  6:18 AM)              Patient Active Problem List   Diagnosis Date Noted   Bloating 04/03/2021   Caries involving multiple surfaces of tooth 03/10/2021   COVID-19 02/20/2021   Acute sinusitis 12/14/2019   Acquired hypothyroidism 07/20/2019   History of atrial fibrillation 07/20/2019   RBBB 07/20/2019   Anemia 07/19/2019   Hoarseness of voice 06/21/2019   Anxiety disorder 04/12/2019   Chronic venous insufficiency 03/25/2019   Dark stools 02/24/2019   Meningioma, cerebral ([place]) 02/16/2019   Grief reaction 10/18/2018   Dysfunction of left eustachian tube 07/07/2018   Ear pain, left 06/13/2018   Colitis, acute 03/14/2018   Constipation 03/14/2018   Dysphonia 03/17/2017   Arthralgia 02/26/2017   Dyspnea 01/27/2017   Tick bite 01/27/2017   Food allergy 01/27/2017   Ingrown toenail 12/08/2016   Acute upper respiratory infection 07/13/2016   [facility] spotted fever) 04/29/2016   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   Onychomycosis 02/04/2016   Osteoporosis, post-menopausal 12/15/2015   Lumbar radiculopathy 08/14/2015   Lumbar scoliosis 08/14/2015   Long term current use of anticoagulant therapy 07/18/2015   Dysuria 06/16/2015   Allergic rhinitis 06/13/2015   Closed wedge compression fracture of fourth lumbar vertebra ([place])    Thrush, oral 01/04/2015   Itching 01/04/2015   Hypokalemia 11/06/2014   Fatigue 10/19/2014   LLQ abdominal pain 10/02/2014   Swelling of left knee joint 10/02/2014   Nausea without vomiting 10/02/2014   DJD (degenerative joint disease) of knee 08/27/2014   Primary localized osteoarthritis of left knee    Breast cancer ([place])    GERD (gastroesophageal reflux disease)    Preop exam for internal medicine 06/13/2014   Bradycardia 11/09/2013   Essential hypertension 11/09/2013   Encounter for therapeutic drug monitoring 09/08/2013   Well adult exam 05/21/2013   Preop cardiovascular exam 05/08/2013   Tremor,  essential 12/14/2012   Right hip pain 05/10/2012   Vertigo 03/09/2012   Dyslipidemia 03/31/2011   Meningioma ([place]) 02/03/2011   History of TIA (transient ischemic attack) 11/13/2010   Abnormal CT of brain 11/13/2010   CAROTID BRUIT 07/28/2010   Edema 05/02/2010   Atrial fibrillation ([place]) 04/08/2010   SYNCOPE 03/31/2010   LOW BACK PAIN 06/05/2009   Chronic maxillary sinusitis 01/31/2009   EFFUSION OF JOINT OTHER SPECIFIED SITE 01/31/2009   Diarrhea 05/31/2008   ABNORMAL CHEST XRAY 05/15/2008   HEMATURIA, MICROSCOPIC, HX OF 08/04/2007   B12 deficiency 03/21/2007   Vitamin D deficiency 03/21/2007   Disease of tricuspid valve 03/21/2007   DIVERTICULOSIS, COLON 03/21/2007   Osteoarthritis 03/21/2007   Osteoporosis 03/21/2007   Personal history of malignant neoplasm of breast 03/21/2007   COLONIC POLYPS, HX OF 03/21/2007    Immunization History  Administered Date(s) Administered  Fluad Quad(high Dose 65+) 04/12/2019, 05/17/2020, 06/11/2021   Influenza Split 05/10/2012   Influenza Whole 05/15/2008, 06/05/2009, 05/27/2010   Influenza, High Dose Seasonal PF 06/30/2016, 05/04/2017, 05/12/2018   Influenza,inj,Quad PF,6+ Mos 05/16/2013, 05/29/2014, 05/17/2015   Influenza,inj,quad, With Preservative 05/16/2013, 05/29/2014, 05/17/2015   Influenza-Unspecified 05/16/2013, 05/29/2014, 05/17/2015   Moderna Sars-Covid-2 Vaccination 08/25/2019, 09/22/2019   Pneumococcal Conjugate-13 08/14/2014   Pneumococcal Polysaccharide-23 04/22/2006   Td 01/17/2007   Tdap 04/23/2020   Zoster Recombinat (Shingrix) 01/21/2018, 03/31/2018   Zoster, Live 06/13/2013    Conditions to be addressed/monitored:  Hypertension, Atrial Fibrillation, Hypothyroidism, Anxiety, and Osteoporosis  Care Plan : [place]  Updates made by Tomasa Blase, RPH since 09/24/2021 12:00 AM     Problem: HTN, Afib, Hypothyroidism, Osteoporosis, Anxiety, Insomnia, HLD   Priority: High  Onset Date: 09/24/2021      Long-Range Goal: Disease Management   Start Date: 09/24/2021  Expected End Date: 09/24/2022  This Visit's Progress: On track  Priority: High  Note:   Current Barriers:  Unable to independently monitor therapeutic efficacy Does not adhere to prescribed medication regimen Does not contact provider office for questions/concerns  Pharmacist Clinical Goal(s):  Patient will achieve adherence to monitoring guidelines and medication adherence to achieve therapeutic efficacy achieve control of blood pressure as evidenced by BP's <140/90 through collaboration with PharmD and provider.   Interventions: 1:1 collaboration with Plotnikov, Evie Lacks, MD regarding development and update of comprehensive plan of care as evidenced by provider attestation and co-signature Inter-disciplinary care team collaboration (see longitudinal plan of care) Comprehensive medication review performed; medication list updated in electronic medical record  Hypertension (BP goal <140/90) -Controlled -Current treatment: Losartan 76m BID  -Medications previously tried: amlodipine, coreg, furosemide, metoprolol, telmisartan   -Current home readings: elevated at 179/86, 148/93, 179/77, 183/83, 158/82, 155/86 -Current dietary habits: tries to watch sodium intake as much as she is able to -Current exercise habits: limited at this time  -Denies hypotensive/hypertensive symptoms -Educated on BP goals and benefits of medications for prevention of heart attack, stroke and kidney damage; Daily salt intake goal < 2300 mg; Exercise goal of 150 minutes per week; Importance of home blood pressure monitoring; Proper BP monitoring technique; Symptoms of hypotension and importance of maintaining adequate hydration; -Counseled to monitor BP at home daily, document, and provide log at future appointments -Recommended for patient to restart losartan at 12.524mdaily   Atrial Fibrillation (Goal: prevent stroke and major  bleeding) -Controlled CHADS2VASc score: Age (2 points), Female sex (1 point), Hypertension history (1 point), and Stroke/TIA/Thromboembolism history (2 points) -Current treatment: Xarelto 1023maily  -Medications previously tried: warfarin, metoprolol tartrate, flecainide, coreg, amiodarone -Home BP and HR readings: elevated at 179/86, 148/93, 179/77, 183/83, 158/82, 155/86, HR of 46, 57, 58, 48, 61  -Counseled on increased risk of stroke due to Afib and benefits of anticoagulation for stroke prevention; importance of adherence to anticoagulant exactly as prescribed; bleeding risk associated with xarelto and importance of self-monitoring for signs/symptoms of bleeding; avoidance of NSAIDs due to increased bleeding risk with anticoagulants; seeking medical attention after a head injury or if there is blood in the urine/stool; -Recommended to continue current medication  Anxiety/ Insomnia (Goal: Prevention of Anxiety Attacks / Promotion of Quality Sleep) -Controlled  -Current treatment: Lorazepam 0.5mg86m1/2-1 tablet nightly as needed - not taking at this time as she threw most recent medication out - denies any issues since stopping (has been without for about 2 weeks) -Medications previously tried/failed: alprazolam, diazepam,  buspirone, duloxetine, escitalopram, mirtazapine -GAD7: not assessed today  -Educated on Benefits of medication for symptom control Benefits of cognitive-behavioral therapy with or without medication -Recommended for patient to trial going without lorazepam as she has been able to do for the past 2 weeks without issue, patient will discuss with PCP at next appointment  -As she has been without for 2 weeks, believe it is safe to continue without at this time   Osteoporosis (Goal Prevention of fractures) -Controlled - most recent dexa scan improved, but is not currently on prolia due to dental issues  -Last DEXA Scan: 09/16/2020   T-Score right femoral neck:  -1.9  T-Score left femoral neck: -1.8  T-Score right total femur: -2.0  T-Score left total femur: -1.3  T-Score left forearm radius: -2.4 -Current treatment  Prolia - 21m every 6 months  Vitamin D3 2000 units daily  -Medications previously tried: alendronate  -Recommended to continue current medication -Patient has follow up appointment with dentist 09/30/2021 to discuss restarting prolia   Hypothyroidism (Goal: Maintenance of euthyroid levels) -Controlled Lab Results  Component Value Date   TSH 1.43 08/28/2021  -Current treatment  Levothyroxine 249m daily  -Medications previously tried: n/a  -Recommended to continue current medication  Hyperlipidemia: (LDL goal < 70) - History of TIA  -Not ideally controlled Lab Results  Component Value Date   LDLCALC 94 02/25/2018  -Current treatment: N/a -Medications previously tried: Atorvastatin, Simvastatin, pravastatin  -Current dietary patterns: tries to limit high cholesterol foods -Current exercise habits: none at this time  -Educated on Cholesterol goals;  Importance of limiting foods high in cholesterol; -Recommended for updated lipid panel, should LDL be elevated from goal, discussion of trial of new lipid lower agent such as zetia, nexletol, or PCSK9 inhibitor depending on level of LDL   Health Maintenance -Vaccine gaps: COVID booster  -Current therapy:  Biotin 49m31maily  Cetirizine 7m79mily  Famotidine 20mg2m  Gas-X as needed  Diphenhydramine 12.5mg h87mrn  APAP 325mg q66mrn  -Educated on Cost vs benefit of each product must be carefully weighed by individual consumer -Patient is satisfied with current therapy and denies issues -Recommended to continue current medication  Patient Goals/Self-Care Activities Patient will:  - take medications as prescribed as evidenced by patient report and record review check blood pressure daily, document, and provide at future appointments  Follow Up Plan: Telephone follow  up appointment with care management team member scheduled for: 3 months  The patient has been provided with contact information for the care management team and has been advised to call with any health related questions or concerns.        Medication Assistance: None required.  Patient affirms current coverage meets needs.  Compliance/Adherence/Medication fill history: Care Gaps: COVID   Patient[facility]:  [facility] [id number] [address] [id number] [phone] pill box? No - using bottles at this time Pt endorses 80-85% compliance  Care Plan and Follow Up Patient Decision:  Patient agrees to Care Plan and Follow-up.  Plan: Telephone follow up appointment with care management team member scheduled for:  3 months  The patient has been provided with contact information for the care management team and has been advised to call with any health related questions or concerns.    Tomasa BlaseD Clinical Pharmacist, [facility]Met[facility] Hospitaling examination/treatment/procedure(s) were performed by non-physician practitioner and as  supervising physician I was immediately available for consultation/collaboration.  I agree with above. Lew Dawes, MD

## 2021-09-29 ENCOUNTER — Other Ambulatory Visit: Payer: Self-pay

## 2021-09-29 ENCOUNTER — Ambulatory Visit (INDEPENDENT_AMBULATORY_CARE_PROVIDER_SITE_OTHER): Payer: Medicare PPO

## 2021-09-29 DIAGNOSIS — E538 Deficiency of other specified B group vitamins: Secondary | ICD-10-CM

## 2021-09-29 MED ORDER — CYANOCOBALAMIN 1000 MCG/ML IJ SOLN
1000.0000 ug | Freq: Once | INTRAMUSCULAR | Status: AC
  Administered 2021-09-29: 1000 ug via INTRAMUSCULAR

## 2021-09-29 NOTE — Progress Notes (Addendum)
Pt here for monthly B12 injection per Dr.Plotnikov  B12 1037mg given IM, and pt tolerated injection well.  Next B12 injection scheduled for 10/31/21.  Medical screening examination/treatment/procedure(s) were performed by non-physician practitioner and as supervising physician I was immediately available for consultation/collaboration.  I agree with above. ALew Dawes MD

## 2021-10-02 NOTE — Telephone Encounter (Signed)
Per visit note 09/24/21:  "-Is not currently using prolia due to dental issues - has follow up with dentist 2/14 to discuss restarting prolia"

## 2021-10-03 ENCOUNTER — Ambulatory Visit: Payer: Medicare PPO | Admitting: Nurse Practitioner

## 2021-10-03 ENCOUNTER — Other Ambulatory Visit: Payer: Self-pay

## 2021-10-03 ENCOUNTER — Encounter: Payer: Self-pay | Admitting: Nurse Practitioner

## 2021-10-03 VITALS — BP 126/74 | HR 55 | Resp 18 | Ht 62.0 in | Wt 120.6 lb

## 2021-10-03 DIAGNOSIS — R448 Other symptoms and signs involving general sensations and perceptions: Secondary | ICD-10-CM

## 2021-10-03 DIAGNOSIS — R0982 Postnasal drip: Secondary | ICD-10-CM | POA: Diagnosis not present

## 2021-10-03 MED ORDER — FLUTICASONE PROPIONATE 50 MCG/ACT NA SUSP: 2.0000 | Freq: Every day | NASAL | 0 refills | Status: AC

## 2021-10-03 NOTE — Progress Notes (Signed)
Subjective:  Patient ID: Alyssa Luna, female    DOB: [birth year]  Age: 88 y.o. MRN: [id number]  CC:  Chief Complaint  Patient presents with   Burning sensation to mouth    Pt mentioned that she has had a lot of dental work done and her teeth have been pulled. Her mouth and tongue have been burning before she had a dental work done. She did go to her dentist last week to make sure she wasn't allergic to the liner they placed in her mouth.      HPI  This patient arrives today for the above.  Her main concern is that she has some burning sensation to her tongue that started about 6 months ago.  She reports she is been having a lot of dental work.  She also has a history of being on Prolia, but has not been on this since dental work started 6 months ago.  She denies significant pain, but it is more of a sensation that is abnormal and uncomfortable for her.  She does report that she is experiencing postnasal drip and some discomfort with swallowing.  She does have a history of anemia and reports that she underwent endoscopy and colonoscopy back in 2021 which showed hiatal hernia, Schatzki ring, diverticula and internal hemorrhoids.  She denies any swelling or pain in her gums or jaw.  She does have a history of B12 deficiency and reports she recently had B12 injection.  She denies any dysphagia.  She does report vocal hoarseness which is new and has been present over the last 6 months.  She denies any fever, but has had weight loss which she attributes to not being able to eat much meat secondary to her alpha gal allergy.  Past Medical History:  Diagnosis Date   AF (atrial fibrillation) (HCC)    Allergy    mild    Alpha galactosidase deficiency    Anemia    Anxiety    has lorazepam on hand for nervousness, pt. reports that she had a break-in to her home on 05/2014   Atrial fibrillation ([place])    D [place]   Breast cancer [facility]) 1984   left    Cataract    removed both eyes    Colon  polyps    Coronary artery disease    Diverticulosis    Diverticulosis of colon    Dysrhythmia    atrial fib   Esophageal dysmotility    Familial tremor    GERD (gastroesophageal reflux disease)    Hemorrhoids    Hiatal hernia    History of breast cancer    History of hiatal hernia    History of ischemic colitis    Hyperlipidemia    Hypertension    Internal hemorrhoids    LBP (low back pain)    Meningioma (HCC)    Neuromuscular disorder (HCC)    essential tremor   Osteoarthritis    hands & back & knees    Osteoporosis    Primary localized osteoarthritis of left knee    Renal cyst    [place] spotted fever    Schatzki's ring    Scoliosis    Stroke ([place])    TIA per pt    TIA (transient ischemic attack)    TR (tricuspid regurgitation)    Mild   Vitamin B12 deficiency    Vitamin D deficiency       Family History  Problem Relation Age of Onset  Breast cancer Mother 102   Tremor Mother    Tremor Brother    Colon cancer Maternal Aunt    Ovarian cancer Maternal Grandmother    Early death Neg Hx    Stroke Neg Hx    Esophageal cancer Neg Hx    Stomach cancer Neg Hx    Pancreatic cancer Neg Hx    Liver disease Neg Hx    Inflammatory bowel disease Neg Hx    Colon polyps Neg Hx    Rectal cancer Neg Hx     Social History   Social History Narrative   ** Merged History Encounter **       Regular Exercise -  NO      Social History   Tobacco Use   Smoking status: Never   Smokeless tobacco: Never  Substance Use Topics   Alcohol use: No    Alcohol/week: 0.0 standard drinks     Current Meds  Medication Sig   acetaminophen (TYLENOL) 325 MG tablet Take 1-2 tablets (325-650 mg total) by mouth every 6 (six) hours as needed for moderate pain.   BIOTIN PO Take 8 mg by mouth daily.   bismuth subsalicylate (PEPTO BISMOL) 262 MG/15ML suspension Take 30 mLs by mouth every 6 (six) hours as needed.   cetirizine (ZYRTEC) 10 MG tablet Take 5 mg by mouth in the  morning and at bedtime.   Cholecalciferol 50 MCG (2000 UT) TBDP Take by mouth daily.   Cyanocobalamin (VITAMIN B-12 IJ) Inject as directed every 30 (thirty) days.   diphenhydrAMINE (BENADRYL) 12.5 MG chewable tablet Chew 12.5 mg by mouth at bedtime as needed for itching.   EPINEPHrine 0.3 mg/0.3 mL IJ SOAJ injection Inject 0.3 mg into the muscle as needed for anaphylaxis.   famotidine (PEPCID) 20 MG tablet TAKE ONE TABLET BY MOUTH TWICE A DAY   fluticasone (FLONASE) 50 MCG/ACT nasal spray Place 2 sprays into both nostrils daily.   gabapentin (NEURONTIN) 600 MG tablet TAKE 1/2 TABLET BY MOUTH 4 TIMES A DAY   levothyroxine (SYNTHROID) 25 MCG tablet TAKE ONE TABLET BY MOUTH DAILY BEFORE BREAKFAST   losartan (COZAAR) 25 MG tablet Take 1 tablet (25 mg total) by mouth in the morning and at bedtime.   montelukast (SINGULAIR) 10 MG tablet Take 10 mg by mouth at bedtime.   Rivaroxaban (XARELTO) 15 MG TABS tablet Take 15 mg by mouth daily.     ROS:  Review of Systems  Constitutional:  Positive for malaise/fatigue and weight loss. Negative for fever.  Respiratory:  Negative for shortness of breath.   Cardiovascular:  Negative for chest pain.  Gastrointestinal:  Positive for abdominal pain. Negative for blood in stool, heartburn, melena, nausea and vomiting.       (+) odynophagia, (+) hoarseness, (-) dysphagia    Objective:   Today's Vitals: BP 126/74    Pulse (!) 55    Resp 18    Ht 5' 2"  (1.575 m)    Wt 120 lb 9.6 oz (54.7 kg)    SpO2 100%    BMI 22.06 kg/m  Vitals with BMI 10/03/2021 09/09/2021 08/28/2021  Height 5' 2"  - -  Weight 120 lbs 10 oz - -  BMI 10.25 - -  Systolic [id number]  Diastolic 74 82 72  Pulse 55 52 44     Physical Exam Vitals reviewed.  Constitutional:      General: She is not in acute distress.    Appearance: Normal appearance.  HENT:  Head: Normocephalic and atraumatic.     Mouth/Throat:     Lips: No lesions.     Mouth: Mucous membranes are dry. No injury or  oral lesions.     Dentition: Has dentures (patient arrives without dentures in place today).     Tongue: No lesions. Tongue does not deviate from midline.     Palate: No mass and lesions.     Pharynx: Oropharynx is clear. Uvula midline. No pharyngeal swelling, oropharyngeal exudate or posterior oropharyngeal erythema.  Neck:     Vascular: No carotid bruit.  Cardiovascular:     Rate and Rhythm: Normal rate and regular rhythm.     Pulses: Normal pulses.     Heart sounds: Normal heart sounds.  Pulmonary:     Effort: Pulmonary effort is normal.     Breath sounds: Normal breath sounds.  Skin:    General: Skin is warm and dry.  Neurological:     General: No focal deficit present.     Mental Status: She is alert and oriented to person, place, and time.  Psychiatric:        Mood and Affect: Mood normal.        Behavior: Behavior normal.        Judgment: Judgment normal.         Assessment and Plan   1. Paresthesia of tongue   2. Post-nasal drip      Plan: 1.,  2.  Etiology unclear at this point.  I am concerned because she is losing weight, experiencing hoarseness of her voice, and is experiencing some mild odynophagia.  We discussed referral to ENT versus GI, and she would prefer to see her gastroenterologist.  I think this is an appropriate referral because of the hoarseness and odynophagia.  May consider ENT in the future if no abnormalities noted by GI.  I also considered possibly getting imaging of her trigeminal nerve, however she is not experiencing severe pain its more just a discomfort so we will do referral and check B12 level for now.  Further recommendations be made based upon blood work results.  We also discussed trialing Flonase nasal spray to see if this helps with her postnasal drip as her symptoms may be a result of chronic postnasal drip.  She did mention she had a procedure done to her sinuses by her dentist.  Thus, I recommend she verify with her dentist that it  would be okay for her to use the Flonase nasal spray before she uses it, she voices her understanding and is agreeable to this.  Patient will follow-up with her primary care provider next week as scheduled, and I recommend she follow-up with me or primary care provider in 1 month to see how she is tolerating Flonase nasal spray.   Tests ordered Orders Placed This Encounter  Procedures   B12   Ambulatory referral to Gastroenterology      Meds ordered this encounter  Medications   fluticasone (FLONASE) 50 MCG/ACT nasal spray    Sig: Place 2 sprays into both nostrils daily.    Dispense:  16 g    Refill:  0    Order Specific Question:   Supervising Provider    Answer:   Binnie Rail [id number]    Patient to follow-up next week as scheduled, and again in 1 month for close monitoring.  Ailene Ards, NP

## 2021-10-03 NOTE — Addendum Note (Signed)
Addended by: Jeralyn Ruths E on: 10/03/2021 12:10 PM   Modules accepted: Level of Service

## 2021-10-04 DIAGNOSIS — R079 Chest pain, unspecified: Secondary | ICD-10-CM | POA: Diagnosis not present

## 2021-10-04 DIAGNOSIS — R0789 Other chest pain: Secondary | ICD-10-CM | POA: Diagnosis not present

## 2021-10-04 DIAGNOSIS — R0602 Shortness of breath: Secondary | ICD-10-CM | POA: Diagnosis not present

## 2021-10-04 DIAGNOSIS — R42 Dizziness and giddiness: Secondary | ICD-10-CM | POA: Diagnosis not present

## 2021-10-04 DIAGNOSIS — Z853 Personal history of malignant neoplasm of breast: Secondary | ICD-10-CM | POA: Diagnosis not present

## 2021-10-04 DIAGNOSIS — K219 Gastro-esophageal reflux disease without esophagitis: Secondary | ICD-10-CM | POA: Diagnosis not present

## 2021-10-04 DIAGNOSIS — R002 Palpitations: Secondary | ICD-10-CM | POA: Diagnosis not present

## 2021-10-04 DIAGNOSIS — G250 Essential tremor: Secondary | ICD-10-CM | POA: Diagnosis not present

## 2021-10-04 DIAGNOSIS — I10 Essential (primary) hypertension: Secondary | ICD-10-CM | POA: Diagnosis not present

## 2021-10-04 DIAGNOSIS — I4891 Unspecified atrial fibrillation: Secondary | ICD-10-CM | POA: Diagnosis not present

## 2021-10-04 DIAGNOSIS — R531 Weakness: Secondary | ICD-10-CM | POA: Diagnosis not present

## 2021-10-04 DIAGNOSIS — K449 Diaphragmatic hernia without obstruction or gangrene: Secondary | ICD-10-CM | POA: Diagnosis not present

## 2021-10-04 DIAGNOSIS — J302 Other seasonal allergic rhinitis: Secondary | ICD-10-CM | POA: Diagnosis not present

## 2021-10-04 DIAGNOSIS — E039 Hypothyroidism, unspecified: Secondary | ICD-10-CM | POA: Diagnosis not present

## 2021-10-04 DIAGNOSIS — F419 Anxiety disorder, unspecified: Secondary | ICD-10-CM | POA: Diagnosis not present

## 2021-10-05 DIAGNOSIS — I083 Combined rheumatic disorders of mitral, aortic and tricuspid valves: Secondary | ICD-10-CM | POA: Diagnosis not present

## 2021-10-05 DIAGNOSIS — I4891 Unspecified atrial fibrillation: Secondary | ICD-10-CM | POA: Diagnosis not present

## 2021-10-06 DIAGNOSIS — I4891 Unspecified atrial fibrillation: Secondary | ICD-10-CM | POA: Diagnosis not present

## 2021-10-08 ENCOUNTER — Ambulatory Visit: Payer: Medicare PPO | Admitting: Internal Medicine

## 2021-10-08 ENCOUNTER — Encounter: Payer: Self-pay | Admitting: Internal Medicine

## 2021-10-08 ENCOUNTER — Other Ambulatory Visit: Payer: Self-pay

## 2021-10-08 DIAGNOSIS — E538 Deficiency of other specified B group vitamins: Secondary | ICD-10-CM

## 2021-10-08 DIAGNOSIS — F413 Other mixed anxiety disorders: Secondary | ICD-10-CM

## 2021-10-08 DIAGNOSIS — R490 Dysphonia: Secondary | ICD-10-CM | POA: Diagnosis not present

## 2021-10-08 DIAGNOSIS — I4891 Unspecified atrial fibrillation: Secondary | ICD-10-CM | POA: Diagnosis not present

## 2021-10-08 DIAGNOSIS — I10 Essential (primary) hypertension: Secondary | ICD-10-CM

## 2021-10-08 NOTE — Assessment & Plan Note (Signed)
Recent A fib w/RVR Pt stopped Losartan Now on Lisinopril and Metoprolol

## 2021-10-08 NOTE — Progress Notes (Signed)
Subjective:  Patient ID: Alyssa Luna, female    DOB: Dec 18, 1933  Age: 88 y.o. MRN: [id number]  CC: Follow-up   HPI Alyssa Luna presents for allergies, palpitations, B12 deficiency, HTN. Pt was hospitalized w/A fib/RVR. Her neighbor died in front of her prior to the event-she was very stressed. Doing OK now   Per hx: "Discharge Summary  PCP: Alyssa Alu, MD Discharge Details   Admit date: 10/04/2021 Discharge date and time: 10/06/2021 2:52 PM Hospital LOS: 3 days  Active Hospital Problems  Diagnosis Date Noted POA   *Atrial fibrillation with rapid ventricular response (*) 10/04/2021 Yes   GERD (gastroesophageal reflux disease) 10/05/2021 Unknown   Hypertension 10/05/2021 Unknown   Acquired hypothyroidism 07/20/2019 Yes   Resolved Hospital Problems  No resolved problems to display.   Discharge Medication List as of 10/06/2021 11:13 AM   DISCONTINUED medications   losartan potassium (COZAAR) 25 mg tablet    NEW medications  Details  lisinopril (PRINIVIL,ZESTRIL) 5 mg tablet Take one tablet (5 mg dose) by mouth daily for 30 days., Starting Mon 10/06/2021, Until Wed 11/05/2021, Normal   metoprolol tartrate (LOPRESSOR) 25 mg tablet Take one tablet (25 mg dose) by mouth 2 (two) times daily., Starting Mon 10/06/2021, Normal    CONTINUED medications  Details  acetaminophen (TYLENOL) 500 mg tablet Take one tablet (500 mg dose) by mouth every 6 (six) hours as needed for Pain., Historical Med   BIOTIN PO Take 1 tablet by mouth every evening., Historical Med   cetirizine (ZYRTEC) 10 mg tablet Take one half tablet (5 mg dose) by mouth 2 (two) times daily., Historical Med   Cholecalciferol (VITAMIN D3) 50 MCG (2000 UT) TABS Take 2,000 Units by mouth 2 (two) times daily., Historical Med   diphenhydrAMINE (BENADRYL) 12.5 MG chewable tablet Chew one tablet (12.5 mg dose) by mouth at bedtime as needed for Itching (RASH)., Historical Med   EPINEPHrine (AUVI-Q,EPIPEN)  0.3 mg/0.3 mL injection Inject 0.3 mLs (0.3 mg dose) into the muscle once as needed for Anaphylaxis (TICK BITE)., Historical Med   famotidine (PEPCID) 20 MG tablet Take one tablet (20 mg dose) by mouth 2 (two) times daily., Historical Med   fluticasone propionate (FLONASE) 50 mcg/actuation nasal spray one spray by Both Nostrils route daily., Historical Med   gabapentin (NEURONTIN) 600 mg tablet Take one half tablet (300 mg dose) by mouth 3 (three) times a day., Historical Med   levothyroxine sodium (SYNTHROID,LOVOTHROID,LEVOXYL) 25 mcg tablet Take one tablet (25 mcg dose) by mouth every morning., Historical Med   montelukast (SINGULAIR) 10 MG tablet Take one tablet (10 mg dose) by mouth every evening., Historical Med   polyethyl glycol-propyl glycol (SYSTANE) 0.4-0.3 % ophthalmic solution Place one drop into both eyes as needed (DRY EYES)., Historical Med   rivaroxaban (XARELTO) 15 mg TABS Take one tablet (15 mg dose) by mouth every evening., [place] [facility] Course   Indication for Admission/Chief Complaint: Palpitations  History of Present Illness: As in HPI: Alyssa Luna is a 88 y.o. female with a past medical history significant for paroxysmal atrial fibrillation on Xarelto, acquired hypothyroidism, hypertension, GERD who presents to the emergency department from [place] living facility with complaints of palpitations. Patient reports acute onset of heart palpitations earlier this evening. She admits to associated dizziness and lightheadedness however denies any loss of consciousness. She denies any chest pain or shortness of breath. She is not on any rate/rhythm controlling agents for her atrial fibrillation. Per  chart review it looks as though patient was previously tried on metoprolol, flecainide, amiodarone, and Coreg however it is unclear when or why these medications were stopped. She last had a cardiology appointment at [facility] in September 2022. She denies any  tobacco, alcohol, or other illicit substance use.   In the emergency department, patient in atrial fibrillation with ventricular rate 169 bpm. Blood pressure elevated however has since improved. Troponin 24-18-18 (E3 H delta -6). CXR without acute intrathoracic disease.  Hospital Course:   1. Atrial fibrillation with rapid ventricular response - The patient was started on diltiazem gtt, and initiated on metoprolol. Her RVR resolved with administration of Metoprolol 25 mg po BID. She was continued on metoprolol for another 24 hours and monitored on telemetry, with no adverse events. She will continue metoprolol at discharge.  - She will continue Xarelto after discharge.    2. Primary hypertension -Well controlled overall. She was started on losartan; however she told me today that this had been discontinued as it caused her to "feel strange" and describes a tingling sensation. No hives or rash or difficulty breathing reported. Therefore we will trial lisinopril 10 mg po daily outpatient which can be titrated as needed by her PCP.    3. Hypothyroidism - Continue Synthroid   4. Seasonal allergies - continue Flonase   5. GERD - continue home pepcid    Recommendations to physicians/followup needed: follow up with PCP routinely.   Physical Exam    Constitutional - resting comfortably, no acute distress Eyes - pupils equal round and reactive to light and accomodation, extraocular movements intact Nose - no gross deformity or drainage Mouth - no oral lesions noted Throat - no swelling or erythema     CV - (+)S1S2, no murmurs, no peripheral edema, no JVD  Resp - CTA bilaterally, no wheezing or crackles, no clubbing, cyanosis  GI - (+)BS, soft, non-tender, non-distended MSK - ROM normal  Skin - no rashes or wounds Neuro - alert, aware, oriented to person/place/time  Psych - normal affect and mood     LABORATORY DATA:   Recent Labs  Lab 10/04/21 1816 10/05/21 1006  WBC 4.2 3.5*   HGB 14.8 13.4  HCT 43.8 39.1  MCV 91 90  PLT 201 194   @ Recent Labs  Lab 10/04/21 1816 10/05/21 1005  NA 142 141  K 3.7 3.8  CL 104 106  CO2 28 25  BUN 12 8  CREATININE 0.70 0.60  CALCIUM 9.9 9.2  MAGNESIUM 1.9 --   No results for input(s): GFRNAA, GFRAFRAMERIC in the last 168 hours. Recent Labs  Lab 10/04/21 1816  PROTEIN 7.2  ALBUMIN 4.5  BILITOT 0.79  ALKPHOS 98  AST 23  ALT 15   No results for input(s): SEDRATE, CRP in the last 168 hours.  Recent Labs  Lab 10/04/21 1816  BNP 1,210   Recent Labs  Lab 10/05/21 1005  TSH 1.92  HGBA1C 4.6*   No results for input(s): UCOLOR, URINEAPPEAR, SPECGRAV, PHUA, PROTUR, UAGLUC, KETONEUA, BILIRUBINUR, BLOOD, BACTERIA, NITRITE, WBCUR, LEUKOCYTES, AMORPHOUS, EPITHELIALCE, UROBILINOGEN, RBCUR, CASTS, CRYSTALS, HYACAS, GRANULARCAST, MUCOUSUR in the last 168 hours.   Recent Labs  Lab 10/04/21 1816 10/05/21 1005  GLUCOSE 145* 96   Radiology/Diagnositc Summary:   Echocardiogram Complete WO Enhancing Agent  Final Result  Left Ventricle: EF: 50-55%.    XR Chest Ap Portable  Final Result  IMPRESSION:  1. No evidence of acute intrathoracic disease. Suspected hiatal hernia.   Electronically Signed by:  Richard Bonsall on 10/04/2021 7:00 PM    [place]  No discharge procedures on file."    A total time of 45 minutes was spent preparing to see the patient, reviewing tests, x-rays, [facility] reports and other medical records.  Also, obtaining history and performing comprehensive physical exam.  Additionally, counseling the patient regarding the above listed issues - A fib, HTN.   Finally, documenting clinical information in the health records, coordination of care, educating the patient. It is a complex case.      Outpatient Medications Prior to Visit  Medication Sig Dispense Refill   acetaminophen (TYLENOL) 325 MG tablet Take 1-2 tablets (325-650 mg total) by mouth every 6 (six) hours as needed for moderate  pain. 100 tablet 3   BIOTIN PO Take 8 mg by mouth daily.     bismuth subsalicylate (PEPTO BISMOL) 262 MG/15ML suspension Take 30 mLs by mouth every 6 (six) hours as needed.     cetirizine (ZYRTEC) 10 MG tablet Take 5 mg by mouth in the morning and at bedtime.     Cholecalciferol 50 MCG (2000 UT) TBDP Take by mouth daily.     Cyanocobalamin (VITAMIN B-12 IJ) Inject as directed every 30 (thirty) days.     denosumab (PROLIA) 60 MG/ML SOLN injection Inject 60 mg into the skin every 6 (six) months. Administer in upper arm, thigh, or abdomen Unable to tolerate oral meds Dx severe osteoporosis 1.8 mL 6   diphenhydrAMINE (BENADRYL) 12.5 MG chewable tablet Chew 12.5 mg by mouth at bedtime as needed for itching.     EPINEPHrine 0.3 mg/0.3 mL IJ SOAJ injection Inject 0.3 mg into the muscle as needed for anaphylaxis.     famotidine (PEPCID) 20 MG tablet TAKE ONE TABLET BY MOUTH TWICE A DAY 180 tablet 3   fluticasone (FLONASE) 50 MCG/ACT nasal spray Place 2 sprays into both nostrils daily. 16 g 0   gabapentin (NEURONTIN) 600 MG tablet TAKE 1/2 TABLET BY MOUTH 4 TIMES A DAY     levothyroxine (SYNTHROID) 25 MCG tablet TAKE ONE TABLET BY MOUTH DAILY BEFORE BREAKFAST 90 tablet 3   lisinopril (ZESTRIL) 5 MG tablet Take 5 mg by mouth daily.     metoprolol tartrate (LOPRESSOR) 25 MG tablet Take 25 mg by mouth 2 (two) times daily.     montelukast (SINGULAIR) 10 MG tablet Take 10 mg by mouth at bedtime.     Rivaroxaban (XARELTO) 15 MG TABS tablet Take 15 mg by mouth daily.      LORazepam (ATIVAN) 0.5 MG tablet TAKE 1/2 TO 1 TABLET BY MOUTH DAILY AS NEEDED FOR ANXIETY OR FOR SLEEP 30 tablet 5   losartan (COZAAR) 25 MG tablet Take 1 tablet (25 mg total) by mouth in the morning and at bedtime. 60 tablet 11   No facility-administered medications prior to visit.    ROS: Review of Systems  Constitutional:  Positive for fatigue. Negative for activity change, appetite change, chills and unexpected weight change.   HENT:  Negative for congestion, mouth sores and sinus pressure.   Eyes:  Negative for visual disturbance.  Respiratory:  Negative for cough and chest tightness.   Cardiovascular:  Negative for chest pain, palpitations and leg swelling.  Gastrointestinal:  Negative for abdominal pain and nausea.  Genitourinary:  Negative for difficulty urinating, frequency and vaginal pain.  Musculoskeletal:  Negative for back pain and gait problem.  Skin:  Negative for pallor and rash.  Neurological:  Negative for dizziness, tremors, weakness, numbness and  headaches.  Psychiatric/Behavioral:  Negative for confusion, sleep disturbance and suicidal ideas. The patient is not nervous/anxious.    Objective:  BP 116/74    Pulse (!) 56    Temp 97.9 F (36.6 C) (Oral)    Ht 5' 2"  (1.575 m)    Wt 119 lb 8 oz (54.2 kg)    SpO2 98%    BMI 21.86 kg/m   BP Readings from Last 3 Encounters:  10/08/21 116/74  10/03/21 126/74  09/09/21 (!) 178/82    Wt Readings from Last 3 Encounters:  10/08/21 119 lb 8 oz (54.2 kg)  10/03/21 120 lb 9.6 oz (54.7 kg)  06/11/21 123 lb 12.8 oz (56.2 kg)    Physical Exam Constitutional:      General: She is not in acute distress.    Appearance: She is well-developed.  HENT:     Head: Normocephalic.     Right Ear: External ear normal.     Left Ear: External ear normal.     Nose: Nose normal.  Eyes:     General:        Right eye: No discharge.        Left eye: No discharge.     Conjunctiva/sclera: Conjunctivae normal.     Pupils: Pupils are equal, round, and reactive to light.  Neck:     Thyroid: No thyromegaly.     Vascular: No JVD.     Trachea: No tracheal deviation.  Cardiovascular:     Rate and Rhythm: Normal rate and regular rhythm.     Heart sounds: Normal heart sounds.  Pulmonary:     Effort: No respiratory distress.     Breath sounds: No stridor. No wheezing.  Abdominal:     General: Bowel sounds are normal. There is no distension.     Palpations: Abdomen is  soft. There is no mass.     Tenderness: There is no abdominal tenderness. There is no guarding or rebound.  Musculoskeletal:        General: No tenderness.     Cervical back: Normal range of motion and neck supple. No rigidity.  Lymphadenopathy:     Cervical: No cervical adenopathy.  Skin:    Findings: No erythema or rash.  Neurological:     Cranial Nerves: No cranial nerve deficit.     Motor: No abnormal muscle tone.     Coordination: Coordination normal.     Deep Tendon Reflexes: Reflexes normal.  Psychiatric:        Behavior: Behavior normal.        Thought Content: Thought content normal.        Judgment: Judgment normal.     A total time of 45 minutes was spent preparing to see the patient, reviewing tests, x-rays, operative reports and other medical records.  Also, obtaining history and performing comprehensive physical exam.  Additionally, counseling the patient regarding the above listed issues.   Finally, documenting clinical information in the health records, coordination of care, educating the patient regarding blood pressure meds, atrial fibrillation, hypertension, stress. It is a complex case.  Lab Results  Component Value Date   WBC 3.5 (L) 08/28/2021   HGB 13.5 08/28/2021   HCT 41.0 08/28/2021   PLT 169.0 08/28/2021   GLUCOSE 72 08/28/2021   CHOL 160 02/25/2018   TRIG 119.0 02/25/2018   HDL 42.90 02/25/2018   LDLDIRECT 178.0 09/24/2011   LDLCALC 94 02/25/2018   ALT 18 08/28/2021   AST 26 08/28/2021   NA  142 08/28/2021   K 4.1 08/28/2021   CL 104 08/28/2021   CREATININE 0.69 08/28/2021   BUN 9 08/28/2021   CO2 31 08/28/2021   TSH 1.43 08/28/2021   INR 1.3 (H) 02/07/2019    No results found.  Assessment & Plan:   Problem List Items Addressed This Visit     Anxiety disorder    Pt was hospitalized w/A fib/RVR. Her neighbor died in front of her prior to the event (hospitalization for atrial fibrillation/hypertension crisis)-she was very stressed.  She is  better now. Nakai decided not to take Lorazepam - d/c      Atrial fibrillation ([place])    Recent A fib w/RVR Pt stopped Losartan Now on Lisinopril and Metoprolol      Relevant Medications   metoprolol tartrate (LOPRESSOR) 25 MG tablet   lisinopril (ZESTRIL) 5 MG tablet   B12 deficiency    IM or SQ B12 q 1 mo      Essential hypertension    Recent A fib w/RVR Pt stopped Losartan Now on Lisinopril and Metoprolol      Relevant Medications   metoprolol tartrate (LOPRESSOR) 25 MG tablet   lisinopril (ZESTRIL) 5 MG tablet   Hoarseness of voice    Use baking soda in water solution to rinse your mouth or try [employer] Peroxic[employer] tooth paste again. It is likely age related         No orders of the defined types were placed in this encounter.     Follow-up: Return in about 2 months (around 12/06/2021) for a follow-up visit.  Walker Kehr, MD

## 2021-10-08 NOTE — Patient Instructions (Addendum)
°  Take Lisinopril at night and Metoprolol twice a day  Use baking soda in water solution to rinse your mouth or try [employer] Peroxic[employer] tooth paste again. Hoarseness is likely age related.

## 2021-10-08 NOTE — Assessment & Plan Note (Signed)
IM or SQ B12 q 1 mo

## 2021-10-08 NOTE — Assessment & Plan Note (Addendum)
Pt was hospitalized w/A fib/RVR. Her neighbor died in front of her prior to the event (hospitalization for atrial fibrillation/hypertension crisis)-she was very stressed.  She is better now. Leigh decided not to take Lorazepam - d/c

## 2021-10-08 NOTE — Assessment & Plan Note (Signed)
Use baking soda in water solution to rinse your mouth or try [employer] tooth paste again. It is likely age related

## 2021-10-14 DIAGNOSIS — I4891 Unspecified atrial fibrillation: Secondary | ICD-10-CM

## 2021-10-14 DIAGNOSIS — E785 Hyperlipidemia, unspecified: Secondary | ICD-10-CM | POA: Diagnosis not present

## 2021-10-14 DIAGNOSIS — I10 Essential (primary) hypertension: Secondary | ICD-10-CM

## 2021-10-14 DIAGNOSIS — M810 Age-related osteoporosis without current pathological fracture: Secondary | ICD-10-CM

## 2021-10-21 ENCOUNTER — Ambulatory Visit: Payer: Medicare PPO | Admitting: Internal Medicine

## 2021-10-21 ENCOUNTER — Encounter: Payer: Self-pay | Admitting: Internal Medicine

## 2021-10-21 VITALS — BP 122/72 | HR 35 | Temp 98.7°F | Ht 62.0 in | Wt 120.0 lb

## 2021-10-21 DIAGNOSIS — I4891 Unspecified atrial fibrillation: Secondary | ICD-10-CM | POA: Diagnosis not present

## 2021-10-21 DIAGNOSIS — F413 Other mixed anxiety disorders: Secondary | ICD-10-CM

## 2021-10-21 MED ORDER — METOPROLOL TARTRATE 25 MG PO TABS: 25.0000 mg | ORAL_TABLET | Freq: Two times a day (BID) | ORAL | 5 refills | Status: AC

## 2021-10-21 MED ORDER — LORAZEPAM 0.5 MG PO TABS: 0.5000 mg | ORAL_TABLET | Freq: Three times a day (TID) | ORAL | 3 refills | Status: DC | PRN

## 2021-10-21 NOTE — Assessment & Plan Note (Addendum)
A fib/A flutter. On Xarelto - will continue. Taking Metoprolol 12.5 mg bid - increase to 25 mg bid. C/o DOE - mild. No CP. Nott too many sx's. C/o anxiety, stress - increase Lorazepam (was taking 1/2 tab prn) - can use 0.5 mg bid-tid.  ?Go to ER if worse by ambulance. ?Cardiology consult at [facility]  This week hopefully ? ?Procedure: EKG today ?Impression: A fib w/RVR.136 BPM ? ?Recent BMET was ok ?

## 2021-10-21 NOTE — Assessment & Plan Note (Addendum)
Worse - a lot of stress ?C/o worsened anxiety - increase Lorazepam (was taking 1/2 tab prn) - can use 0.5 mg bid-tid.  ?

## 2021-10-21 NOTE — Progress Notes (Addendum)
? ?Subjective:  ?Patient ID: Alyssa Luna, female    DOB: 1934/02/09  Age: 88 y.o. MRN: [id number] ? ?CC: No chief complaint on file. ? ? ?HPI ?Alyssa Luna presents for A fib/A flutter. On Xarelto. Taking Metoprolol 12.5 mg bid. C/o DOE. No CP. C/o anxiety, stress. ?Mary's brother is driving ? ?Outpatient Medications Prior to Visit  ?Medication Sig Dispense Refill  ? acetaminophen (TYLENOL) 325 MG tablet Take 1-2 tablets (325-650 mg total) by mouth every 6 (six) hours as needed for moderate pain. 100 tablet 3  ? BIOTIN PO Take 8 mg by mouth daily.    ? bismuth subsalicylate (PEPTO BISMOL) 262 MG/15ML suspension Take 30 mLs by mouth every 6 (six) hours as needed.    ? cetirizine (ZYRTEC) 10 MG tablet Take 5 mg by mouth in the morning and at bedtime.    ? Cholecalciferol 50 MCG (2000 UT) TBDP Take by mouth daily.    ? Cyanocobalamin (VITAMIN B-12 IJ) Inject as directed every 30 (thirty) days.    ? denosumab (PROLIA) 60 MG/ML SOLN injection Inject 60 mg into the skin every 6 (six) months. Administer in upper arm, thigh, or abdomen ?Unable to tolerate oral meds ?Dx severe osteoporosis 1.8 mL 6  ? diphenhydrAMINE (BENADRYL) 12.5 MG chewable tablet Chew 12.5 mg by mouth at bedtime as needed for itching.    ? EPINEPHrine 0.3 mg/0.3 mL IJ SOAJ injection Inject 0.3 mg into the muscle as needed for anaphylaxis.    ? famotidine (PEPCID) 20 MG tablet TAKE ONE TABLET BY MOUTH TWICE A DAY 180 tablet 3  ? fluticasone (FLONASE) 50 MCG/ACT nasal spray Place 2 sprays into both nostrils daily. 16 g 0  ? gabapentin (NEURONTIN) 600 MG tablet TAKE 1/2 TABLET BY MOUTH 4 TIMES A DAY    ? levothyroxine (SYNTHROID) 25 MCG tablet TAKE ONE TABLET BY MOUTH DAILY BEFORE BREAKFAST 90 tablet 3  ? lisinopril (ZESTRIL) 5 MG tablet Take 5 mg by mouth daily.    ? montelukast (SINGULAIR) 10 MG tablet Take 10 mg by mouth at bedtime.    ? Rivaroxaban (XARELTO) 15 MG TABS tablet Take 15 mg by mouth daily.     ? LORazepam (ATIVAN) 0.5 MG tablet  Take by mouth.    ? metoprolol tartrate (LOPRESSOR) 25 MG tablet Take 25 mg by mouth 2 (two) times daily.    ? ?No facility-administered medications prior to visit.  ? ? ?ROS: ?Review of Systems  ?Constitutional:  Positive for fatigue. Negative for activity change, appetite change, chills and unexpected weight change.  ?HENT:  Negative for congestion, mouth sores and sinus pressure.   ?Eyes:  Negative for visual disturbance.  ?Respiratory:  Positive for shortness of breath. Negative for cough and chest tightness.   ?Cardiovascular:  Positive for palpitations. Negative for chest pain and leg swelling.  ?Gastrointestinal:  Negative for abdominal pain and nausea.  ?Genitourinary:  Negative for difficulty urinating, frequency and vaginal pain.  ?Musculoskeletal:  Negative for back pain and gait problem.  ?Skin:  Negative for pallor and rash.  ?Neurological:  Negative for dizziness, tremors, weakness, light-headedness, numbness and headaches.  ?Psychiatric/Behavioral:  Negative for confusion and sleep disturbance. The patient is nervous/anxious.   ? ?Objective:  ?BP 122/72 (BP Location: Left Arm, Patient Position: Sitting, Cuff Size: Large)   Pulse (!) 35   Temp 98.7 ?F (37.1 ?C) (Oral)   Ht 5' 2"  (1.575 m)   Wt 120 lb (54.4 kg)   SpO2 93%   BMI  21.95 kg/m?  ? ?BP Readings from Last 3 Encounters:  ?10/21/21 122/72  ?10/08/21 116/74  ?10/03/21 126/74  ? ? ?Wt Readings from Last 3 Encounters:  ?10/21/21 120 lb (54.4 kg)  ?10/08/21 119 lb 8 oz (54.2 kg)  ?10/03/21 120 lb 9.6 oz (54.7 kg)  ? ? ?Physical Exam ?Constitutional:   ?   General: She is not in acute distress. ?   Appearance: Normal appearance. She is well-developed.  ?HENT:  ?   Head: Normocephalic.  ?   Right Ear: External ear normal.  ?   Left Ear: External ear normal.  ?   Nose: Nose normal.  ?Eyes:  ?   General:     ?   Right eye: No discharge.     ?   Left eye: No discharge.  ?   Conjunctiva/sclera: Conjunctivae normal.  ?   Pupils: Pupils are equal,  round, and reactive to light.  ?Neck:  ?   Thyroid: No thyromegaly.  ?   Vascular: No JVD.  ?   Trachea: No tracheal deviation.  ?Cardiovascular:  ?   Rate and Rhythm: Tachycardia present. Rhythm irregular.  ?   Heart sounds:  ?  No gallop.  ?Pulmonary:  ?   Effort: No respiratory distress.  ?   Breath sounds: No stridor. No wheezing, rhonchi or rales.  ?Chest:  ?   Chest wall: No tenderness.  ?Abdominal:  ?   General: Bowel sounds are normal. There is no distension.  ?   Palpations: Abdomen is soft. There is no mass.  ?   Tenderness: There is no abdominal tenderness. There is no guarding or rebound.  ?Musculoskeletal:     ?   General: No tenderness.  ?   Cervical back: Normal range of motion and neck supple. No rigidity.  ?Lymphadenopathy:  ?   Cervical: No cervical adenopathy.  ?Skin: ?   Findings: No erythema or rash.  ?Neurological:  ?   Mental Status: She is oriented to person, place, and time.  ?   Cranial Nerves: No cranial nerve deficit.  ?   Motor: Weakness present. No abnormal muscle tone.  ?   Coordination: Coordination abnormal.  ?   Gait: Gait abnormal.  ?   Deep Tendon Reflexes: Reflexes normal.  ?Psychiatric:     ?   Behavior: Behavior normal.     ?   Thought Content: Thought content normal.     ?   Judgment: Judgment normal.  ?Using a walker ?A/o/c ? ?Procedure: EKG ?Indication: palpitations ?Impression: A fib w/RVR.136 BPM ? ? ? ? A total time of 45 minutes was spent preparing to see the patient, reviewing tests, x-rays, operative reports and other medical records.  Also, obtaining history and performing comprehensive physical exam.  Additionally, counseling the patient regarding the above listed issues.   Finally, documenting clinical information in the health records, coordination of care, educating the patient re A fib w/RVR. It is a complex case. ? ?Lab Results  ?Component Value Date  ? WBC 3.5 (L) 08/28/2021  ? HGB 13.5 08/28/2021  ? HCT 41.0 08/28/2021  ? PLT 169.0 08/28/2021  ? GLUCOSE 72  08/28/2021  ? CHOL 160 02/25/2018  ? TRIG 119.0 02/25/2018  ? HDL 42.90 02/25/2018  ? LDLDIRECT 178.0 09/24/2011  ? [place] 94 02/25/2018  ? ALT 18 08/28/2021  ? AST 26 08/28/2021  ? NA 142 08/28/2021  ? K 4.1 08/28/2021  ? CL 104 08/28/2021  ? CREATININE 0.69 08/28/2021  ?  BUN 9 08/28/2021  ? CO2 31 08/28/2021  ? TSH 1.43 08/28/2021  ? INR 1.3 (H) 02/07/2019  ? ? ?No results found. ? ?Assessment & Plan:  ? ?Problem List Items Addressed This Visit   ? ? Anxiety disorder  ?  Worse - a lot of stress ?C/o worsened anxiety - increase Lorazepam (was taking 1/2 tab prn) - can use 0.5 mg bid-tid.  ?  ?  ? Relevant Medications  ? LORazepam (ATIVAN) 0.5 MG tablet  ? Atrial fibrillation ([place]) - Primary  ?  A fib/A flutter. On Xarelto - will continue. Taking Metoprolol 12.5 mg bid - increase to 25 mg bid. C/o DOE - mild. No CP. Nott too many sx's. C/o anxiety, stress - increase Lorazepam (was taking 1/2 tab prn) - can use 0.5 mg bid-tid.  ?Go to ER if worse by ambulance. ?Cardiology consult at [facility]  This week hopefully ? ?Procedure: EKG today ?Impression: A fib w/RVR.136 BPM ? ?Recent BMET was ok ?  ?  ? Relevant Medications  ? metoprolol tartrate (LOPRESSOR) 25 MG tablet  ? Other Relevant Orders  ? Ambulatory referral to Cardiology  ?  ? ? ?Meds ordered this encounter  ?Medications  ? metoprolol tartrate (LOPRESSOR) 25 MG tablet  ?  Sig: Take 1 tablet (25 mg total) by mouth 2 (two) times daily.  ?  Dispense:  60 tablet  ?  Refill:  5  ? LORazepam (ATIVAN) 0.5 MG tablet  ?  Sig: Take 1 tablet (0.5 mg total) by mouth every 8 (eight) hours as needed for anxiety or sleep.  ?  Dispense:  90 tablet  ?  Refill:  3  ?  ? ? ?Follow-up: No follow-ups on file. ? ?Walker Kehr, MD ?

## 2021-10-31 ENCOUNTER — Ambulatory Visit: Payer: Medicare PPO | Admitting: Podiatry

## 2021-10-31 ENCOUNTER — Ambulatory Visit (INDEPENDENT_AMBULATORY_CARE_PROVIDER_SITE_OTHER): Payer: Medicare PPO

## 2021-10-31 ENCOUNTER — Encounter: Payer: Self-pay | Admitting: Podiatry

## 2021-10-31 ENCOUNTER — Other Ambulatory Visit: Payer: Self-pay

## 2021-10-31 DIAGNOSIS — B351 Tinea unguium: Secondary | ICD-10-CM

## 2021-10-31 DIAGNOSIS — S8992XA Unspecified injury of left lower leg, initial encounter: Secondary | ICD-10-CM

## 2021-10-31 DIAGNOSIS — S99922A Unspecified injury of left foot, initial encounter: Secondary | ICD-10-CM | POA: Diagnosis not present

## 2021-10-31 DIAGNOSIS — E538 Deficiency of other specified B group vitamins: Secondary | ICD-10-CM

## 2021-10-31 DIAGNOSIS — L600 Ingrowing nail: Secondary | ICD-10-CM | POA: Diagnosis not present

## 2021-10-31 DIAGNOSIS — M79609 Pain in unspecified limb: Secondary | ICD-10-CM | POA: Diagnosis not present

## 2021-10-31 MED ORDER — CYANOCOBALAMIN 1000 MCG/ML IJ SOLN
1000.0000 ug | Freq: Once | INTRAMUSCULAR | Status: AC
  Administered 2021-10-31: 1000 ug via INTRAMUSCULAR

## 2021-10-31 NOTE — Progress Notes (Signed)
Pt here for monthly B12 injection  ? ?B12 1045mg given IM, and pt tolerated injection well. ? ?Next B12 injection scheduled for 12/04/20 ? ?

## 2021-11-04 DIAGNOSIS — I480 Paroxysmal atrial fibrillation: Secondary | ICD-10-CM | POA: Diagnosis not present

## 2021-11-04 DIAGNOSIS — I10 Essential (primary) hypertension: Secondary | ICD-10-CM | POA: Diagnosis not present

## 2021-11-05 ENCOUNTER — Ambulatory Visit: Payer: Medicare PPO

## 2021-11-05 ENCOUNTER — Ambulatory Visit: Payer: Medicare PPO | Admitting: Internal Medicine

## 2021-11-07 ENCOUNTER — Other Ambulatory Visit: Payer: Self-pay | Admitting: Internal Medicine

## 2021-11-09 NOTE — Progress Notes (Signed)
?Subjective:  ?Patient ID: Alyssa Luna, female    DOB: 1934-06-23,  MRN: [id number] ? ?Alyssa Luna presents to clinic today for painful thick toenails that are difficult to trim. Pain interferes with ambulation. Aggravating factors include wearing enclosed shoe gear. Pain is relieved with periodic professional debridement. ? ?New problem(s):  Patient presents with chief concern of injury to left great toe. Injury occurred a while ago. Injury recurred as a result of a hitting her toe on the bed post . Patient states aggravating factor(s) is/are direct pressure.  Patient has tried no attempts at treatment. Pain is located at distal corner of the left great toe. She denies any redness, drainage or swelling of digit.  She also relates hitting her left leg in the past few days. She is not sure what she hit her leg on. She has been dressing it daily and states it is getting better. ? ? ? ?PCP is Plotnikov, Evie Lacks, MD , and last visit was October 18, 2021. ? ?Allergies  ?Allergen Reactions  ? Alpha-Gal Itching and Nausea Only  ?  Per patient, cannot have alpha-gal since tic bite episode. ?Per patient, cannot have alpha-gal since tic bite episode. ?Per patient, cannot have alpha-gal since tic bite episode.  ? Beef (Bovine) Protein Other (See Comments)  ?  Alpha-gal allergy testing positive June 2018  ? Galactose Nausea Only  ?  Per patient, cannot have alpha-gal since tic bite episode.  ? Pravastatin Anaphylaxis and Other (See Comments)  ?  Legs ache  ? Lac Bovis Nausea Only and Rash  ? Alpha-D-Galactosidase Itching  ? Amlodipine   ?  Leg swelling  ? Amlodipine-Atorvastatin   ?  swelling  ? Cefdinir   ?  Nausea from suspension  ? Colchicine Other (See Comments)  ? Doxycycline Other (See Comments)  ?  Nausea - able to take w/food  ? Losartan   ?  Intolerance ?  ? Shellfish Allergy Other (See Comments)  ?  Unknown  ? Tikosyn [Dofetilide] Other (See Comments)  ?  Unknown reaction and was told never to take it due  to problems during sleep   ? Actonel [Risedronate Sodium] Other (See Comments)  ?  Not known  ? Alendronate Other (See Comments)  ?  NOT KNOWN  ? Augmentin [Amoxicillin-Pot Clavulanate] Rash  ?  Has patient had a PCN reaction causing immediate rash, facial/tongue/throat swelling, SOB or lightheadedness with hypotension: Yes ?Has patient had a PCN reaction causing severe rash involving mucus membranes or skin necrosis:No ?Has patient had a PCN reaction that required hospitalization: No ?Has patient had a PCN reaction occurring within the last 10 years: No ?If all of the above answers are "NO", then may proceed with Cephalosporin use. ?  ? Beef Allergy Rash  ? Cheese Rash  ? Ciprofloxacin Nausea Only  ? Evista [Raloxifene] Other (See Comments)  ?  Unknown reaction  ? Flagyl [Metronidazole] Other (See Comments)  ?  Not known  ? Fosamax [Alendronate Sodium] Other (See Comments)  ?  NOT KNOWN  ? Gold-Containing Drug Products Other (See Comments)  ?  Per Dr  ? Loma Sousa [Escitalopram Oxalate] Other (See Comments)  ?  shaky  ? Other Rash  ? Pork Allergy Rash  ? Risedronate Other (See Comments)  ?  Unknown reaction  ? Simvastatin Other (See Comments)  ?  REACTION: leg cramps, weakness  ? Tramadol Other (See Comments)  ?  Mental disturbance changes  ? ? ?Review of Systems: Negative except  as noted in the HPI. ? ?Objective: No changes noted in today's physical examination. ?Constitutional Alyssa Luna is a pleasant 88 y.o. Caucasian female, WD, WN in NAD. AAO x 3.   ?Vascular CFT immediate b/l LE. Palpable DP/PT pulses b/l LE. Digital hair sparse b/l. Skin temperature gradient WNL b/l. No pain with calf compression b/l. No edema noted b/l. Evidence of chronic venous insufficiency b/l LE. No cyanosis or clubbing noted b/l LE.  ?Neurologic Normal speech. Oriented to person, place, and time. Protective sensation intact 5/5 intact bilaterally with 10g monofilament b/l.  ?Dermatologic Pedal integument with normal turgor,  texture and tone b/l LE. No open wounds b/l. No interdigital macerations b/l. Toenails 1-5 b/l elongated, thickened, discolored with subungual debris. +Tenderness with dorsal palpation of nailplates. Left hallux nailplate cracked into medial border. No erythema, no edema, no drainage, no fluctuance. No hyperkeratotic or porokeratotic lesions present. Wound left leg dressing clean, dry and intact. Underlying traumatic wound, stable with no surrounding erythema, no edema, no drainage.  ?Orthopedic: Normal muscle strength 5/5 to all lower extremity muscle groups bilaterally. Hammertoe(s) noted to the R 5th toe.[name] No pain, crepitus or joint limitation noted with ROM b/l LE.  Patient ambulates independently without assistive aids. No edema noted left hallux, no pain on palpation or ROM. No ecchymosis.  ? ?Radiographs: None ? ?Assessment/Plan: ?1. Pain due to onychomycosis of nail   ?2. Injury of toe, left, initial encounter   ?3. Ingrown toenail without infection   ?4. Injury of left lower extremity, initial encounter   ?  ?-Patient was evaluated and treated. All patient's and/or POA's questions/concerns answered on today's visit. ?-Pictures taken of left leg wound. Patient states she is keeping it clean and protected daily. Appears to be stable today. She will contact PCP if any problems arise. ?-Toenails 2-5 bilaterally and R hallux debrided in length and girth without iatrogenic bleeding with sterile nail nipper and dremel.  ?-Offending nail border debrided and curretaged L hallux utilizing sterile nail nipper and currette. Border cleansed with alcohol and triple antibiotic applied. No further treatment required by patient/caregiver. ?-Patient/POA to call should there be question/concern in the interim.  ? ?Return in about 3 months (around 01/31/2022). ? ?Marzetta Board, DPM  ?

## 2021-11-10 NOTE — Telephone Encounter (Signed)
LOV: 10/21/21 ?Next OV: 12/04/21 ?

## 2021-11-11 DIAGNOSIS — Z91014 Allergy to mammalian meats: Secondary | ICD-10-CM | POA: Diagnosis not present

## 2021-11-17 DIAGNOSIS — K121 Other forms of stomatitis: Secondary | ICD-10-CM | POA: Diagnosis not present

## 2021-11-17 DIAGNOSIS — K219 Gastro-esophageal reflux disease without esophagitis: Secondary | ICD-10-CM | POA: Diagnosis not present

## 2021-11-17 DIAGNOSIS — I4510 Unspecified right bundle-branch block: Secondary | ICD-10-CM | POA: Diagnosis not present

## 2021-11-17 DIAGNOSIS — I4891 Unspecified atrial fibrillation: Secondary | ICD-10-CM | POA: Diagnosis not present

## 2021-11-21 DIAGNOSIS — I480 Paroxysmal atrial fibrillation: Secondary | ICD-10-CM | POA: Diagnosis not present

## 2021-11-26 DIAGNOSIS — I4819 Other persistent atrial fibrillation: Secondary | ICD-10-CM | POA: Diagnosis not present

## 2021-11-26 DIAGNOSIS — K146 Glossodynia: Secondary | ICD-10-CM | POA: Diagnosis not present

## 2021-11-26 DIAGNOSIS — K219 Gastro-esophageal reflux disease without esophagitis: Secondary | ICD-10-CM | POA: Diagnosis not present

## 2021-11-26 DIAGNOSIS — E034 Atrophy of thyroid (acquired): Secondary | ICD-10-CM | POA: Diagnosis not present

## 2021-11-26 DIAGNOSIS — M810 Age-related osteoporosis without current pathological fracture: Secondary | ICD-10-CM | POA: Diagnosis not present

## 2021-11-26 DIAGNOSIS — I10 Essential (primary) hypertension: Secondary | ICD-10-CM | POA: Diagnosis not present

## 2021-11-26 DIAGNOSIS — Z91018 Allergy to other foods: Secondary | ICD-10-CM | POA: Diagnosis not present

## 2021-11-26 DIAGNOSIS — Z91014 Allergy to mammalian meats: Secondary | ICD-10-CM | POA: Diagnosis not present

## 2021-11-27 ENCOUNTER — Telehealth: Payer: Self-pay | Admitting: Internal Medicine

## 2021-11-27 ENCOUNTER — Ambulatory Visit: Payer: Medicare PPO | Admitting: Internal Medicine

## 2021-11-27 NOTE — Telephone Encounter (Signed)
Pt called in requesting dates of when each of her Prolia injections were administered.  ? ?Pt states she needs them to send to another doctor.  ? ?Please call pt at earliest convenience.  ?

## 2021-11-28 NOTE — Telephone Encounter (Signed)
Called pt no answer LMOM last prolia injection per chart last injection 01/18/2020 and 07/25/2020..Alyssa Luna ?

## 2021-12-04 ENCOUNTER — Ambulatory Visit: Payer: Medicare PPO | Admitting: Internal Medicine

## 2021-12-04 ENCOUNTER — Ambulatory Visit: Payer: Medicare PPO

## 2021-12-27 ENCOUNTER — Other Ambulatory Visit: Payer: Self-pay | Admitting: Internal Medicine

## 2022-01-01 ENCOUNTER — Telehealth: Payer: Medicare PPO

## 2022-01-02 DIAGNOSIS — Z7901 Long term (current) use of anticoagulants: Secondary | ICD-10-CM | POA: Diagnosis not present

## 2022-01-02 DIAGNOSIS — I4891 Unspecified atrial fibrillation: Secondary | ICD-10-CM | POA: Diagnosis not present

## 2022-01-02 DIAGNOSIS — I480 Paroxysmal atrial fibrillation: Secondary | ICD-10-CM | POA: Diagnosis not present

## 2022-02-06 ENCOUNTER — Ambulatory Visit: Payer: Medicare PPO | Admitting: Podiatry

## 2022-02-06 ENCOUNTER — Encounter: Payer: Self-pay | Admitting: Podiatry

## 2022-02-06 DIAGNOSIS — B351 Tinea unguium: Secondary | ICD-10-CM

## 2022-02-06 DIAGNOSIS — M79609 Pain in unspecified limb: Secondary | ICD-10-CM

## 2022-02-16 NOTE — Progress Notes (Signed)
Subjective:  Patient ID: Alyssa Luna, female    DOB: 11-23-1933,  MRN: [id number]  Alyssa Luna presents to clinic today for painful elongated mycotic toenails 1-5 bilaterally which are tender when wearing enclosed shoe gear. Pain is relieved with periodic professional debridement.  New problem(s): None.   Patient states she lives in [place], and it is becoming more difficult for her to drive the distance to our [place] office.  PCP is Plotnikov, Evie Lacks, MD , and last visit was October 21, 2021.  Allergies  Allergen Reactions   Alpha-Gal Itching and Nausea Only    Per patient, cannot have alpha-gal since tic bite episode. Per patient, cannot have alpha-gal since tic bite episode. Per patient, cannot have alpha-gal since tic bite episode.   Beef (Bovine) Protein Other (See Comments)    Alpha-gal allergy testing positive June 2018   Galactose Nausea Only    Per patient, cannot have alpha-gal since tic bite episode.   Pravastatin Anaphylaxis and Other (See Comments)    Legs ache   Lac Bovis Nausea Only and Rash   Alpha-D-Galactosidase Itching   Amlodipine     Leg swelling   Amlodipine-Atorvastatin     swelling   Cefdinir     Nausea from suspension   Colchicine Other (See Comments)   Doxycycline Other (See Comments)    Nausea - able to take w/food   Losartan     Intolerance    Shellfish Allergy Other (See Comments)    Unknown   Tikosyn [Dofetilide] Other (See Comments)    Unknown reaction and was told never to take it due to problems during sleep    Actonel [Risedronate Sodium] Other (See Comments)    Not known   Alendronate Other (See Comments)    NOT KNOWN   Augmentin [Amoxicillin-Pot Clavulanate] Rash    Has patient had a PCN reaction causing immediate rash, facial/tongue/throat swelling, SOB or lightheadedness with hypotension: Yes Has patient had a PCN reaction causing severe rash involving mucus membranes or skin necrosis:No Has patient had a PCN  reaction that required hospitalization: No Has patient had a PCN reaction occurring within the last 10 years: No If all of the above answers are "NO", then may proceed with Cephalosporin use.    Beef Allergy Rash   Cheese Rash   Ciprofloxacin Nausea Only   Evista [Raloxifene] Other (See Comments)    Unknown reaction   Flagyl [Metronidazole] Other (See Comments)    Not known   Fosamax [Alendronate Sodium] Other (See Comments)    NOT KNOWN   Gold-Containing Drug Products Other (See Comments)    Per Dr   Loma Sousa [Escitalopram Oxalate] Other (See Comments)    shaky   Other Rash   Pork Allergy Rash   Risedronate Other (See Comments)    Unknown reaction   Simvastatin Other (See Comments)    REACTION: leg cramps, weakness   Tramadol Other (See Comments)    Mental disturbance changes    Review of Systems: Negative except as noted in the HPI.  Objective: No changes noted in today's physical examination. Constitutional Alyssa Luna is a pleasant 88 y.o. Caucasian female, WD, WN in NAD. AAO x 3.   Vascular CFT immediate b/l LE. Palpable DP/PT pulses b/l LE. Digital hair sparse b/l. Skin temperature gradient WNL b/l. No pain with calf compression b/l. No edema noted b/l. Evidence of chronic venous insufficiency b/l LE. No cyanosis or clubbing noted b/l LE.  Neurologic Normal speech. Oriented to person,  place, and time. Protective sensation intact 5/5 intact bilaterally with 10g monofilament b/l.  Dermatologic Pedal integument with normal turgor, texture and tone b/l LE. No open wounds b/l. No interdigital macerations b/l. Toenails 1-5 b/l elongated, thickened, discolored with subungual debris. +Tenderness with dorsal palpation of nailplates.   Orthopedic: Normal muscle strength 5/5 to all lower extremity muscle groups bilaterally. Hammertoe(s) noted to the R 5th toe.[name] No pain, crepitus or joint limitation noted with ROM b/l LE.  Patient ambulates independently without assistive aids. No  edema noted left hallux, no pain on palpation or ROM. No ecchymosis.   Radiographs: None  Assessment/Plan: 1. Pain due to onychomycosis of nail      -Examined patient. -Patient to continue soft, supportive shoe gear daily. -Mycotic toenails 1-5 bilaterally were debrided in length and girth with sterile nail nippers and dremel without incident. -We discussed her issue of driving. She may reschedule her next appointment at our [facility] office. -Patient/POA to call should there be question/concern in the interim.   Return in about 3 months (around 05/09/2022).  Marzetta Board, DPM

## 2022-03-05 DIAGNOSIS — I480 Paroxysmal atrial fibrillation: Secondary | ICD-10-CM | POA: Diagnosis not present

## 2022-03-05 DIAGNOSIS — I10 Essential (primary) hypertension: Secondary | ICD-10-CM | POA: Diagnosis not present

## 2022-03-10 DIAGNOSIS — I4891 Unspecified atrial fibrillation: Secondary | ICD-10-CM | POA: Diagnosis not present

## 2022-03-10 DIAGNOSIS — Z7901 Long term (current) use of anticoagulants: Secondary | ICD-10-CM | POA: Diagnosis not present

## 2022-03-10 DIAGNOSIS — I4510 Unspecified right bundle-branch block: Secondary | ICD-10-CM | POA: Diagnosis not present

## 2022-03-10 DIAGNOSIS — I455 Other specified heart block: Secondary | ICD-10-CM | POA: Diagnosis not present

## 2022-03-10 DIAGNOSIS — I480 Paroxysmal atrial fibrillation: Secondary | ICD-10-CM | POA: Diagnosis not present

## 2022-03-12 DIAGNOSIS — I495 Sick sinus syndrome: Secondary | ICD-10-CM | POA: Diagnosis not present

## 2022-03-12 DIAGNOSIS — E039 Hypothyroidism, unspecified: Secondary | ICD-10-CM | POA: Diagnosis not present

## 2022-03-12 DIAGNOSIS — Z45018 Encounter for adjustment and management of other part of cardiac pacemaker: Secondary | ICD-10-CM | POA: Diagnosis not present

## 2022-03-12 DIAGNOSIS — I484 Atypical atrial flutter: Secondary | ICD-10-CM | POA: Diagnosis not present

## 2022-03-12 DIAGNOSIS — I4510 Unspecified right bundle-branch block: Secondary | ICD-10-CM | POA: Diagnosis not present

## 2022-03-12 DIAGNOSIS — F419 Anxiety disorder, unspecified: Secondary | ICD-10-CM | POA: Diagnosis not present

## 2022-03-12 DIAGNOSIS — J984 Other disorders of lung: Secondary | ICD-10-CM | POA: Diagnosis not present

## 2022-03-12 DIAGNOSIS — E785 Hyperlipidemia, unspecified: Secondary | ICD-10-CM | POA: Diagnosis not present

## 2022-03-12 DIAGNOSIS — R002 Palpitations: Secondary | ICD-10-CM | POA: Diagnosis not present

## 2022-03-12 DIAGNOSIS — R918 Other nonspecific abnormal finding of lung field: Secondary | ICD-10-CM | POA: Diagnosis not present

## 2022-03-12 DIAGNOSIS — J849 Interstitial pulmonary disease, unspecified: Secondary | ICD-10-CM | POA: Diagnosis not present

## 2022-03-12 DIAGNOSIS — G250 Essential tremor: Secondary | ICD-10-CM | POA: Diagnosis not present

## 2022-03-12 DIAGNOSIS — R42 Dizziness and giddiness: Secondary | ICD-10-CM | POA: Diagnosis not present

## 2022-03-12 DIAGNOSIS — J90 Pleural effusion, not elsewhere classified: Secondary | ICD-10-CM | POA: Diagnosis not present

## 2022-03-12 DIAGNOSIS — R000 Tachycardia, unspecified: Secondary | ICD-10-CM | POA: Diagnosis not present

## 2022-03-12 DIAGNOSIS — I480 Paroxysmal atrial fibrillation: Secondary | ICD-10-CM | POA: Diagnosis not present

## 2022-03-12 DIAGNOSIS — I4891 Unspecified atrial fibrillation: Secondary | ICD-10-CM | POA: Diagnosis not present

## 2022-03-12 DIAGNOSIS — R0602 Shortness of breath: Secondary | ICD-10-CM | POA: Diagnosis not present

## 2022-03-12 DIAGNOSIS — I10 Essential (primary) hypertension: Secondary | ICD-10-CM | POA: Diagnosis not present

## 2022-03-12 DIAGNOSIS — Z950 Presence of cardiac pacemaker: Secondary | ICD-10-CM | POA: Diagnosis not present

## 2022-03-12 DIAGNOSIS — K219 Gastro-esophageal reflux disease without esophagitis: Secondary | ICD-10-CM | POA: Diagnosis not present

## 2022-03-17 DIAGNOSIS — E034 Atrophy of thyroid (acquired): Secondary | ICD-10-CM | POA: Diagnosis not present

## 2022-03-17 DIAGNOSIS — I10 Essential (primary) hypertension: Secondary | ICD-10-CM | POA: Diagnosis not present

## 2022-03-17 DIAGNOSIS — R5382 Chronic fatigue, unspecified: Secondary | ICD-10-CM | POA: Diagnosis not present

## 2022-03-17 DIAGNOSIS — R6889 Other general symptoms and signs: Secondary | ICD-10-CM | POA: Diagnosis not present

## 2022-03-17 DIAGNOSIS — R5381 Other malaise: Secondary | ICD-10-CM | POA: Diagnosis not present

## 2022-03-17 DIAGNOSIS — Z950 Presence of cardiac pacemaker: Secondary | ICD-10-CM | POA: Diagnosis not present

## 2022-03-17 DIAGNOSIS — F419 Anxiety disorder, unspecified: Secondary | ICD-10-CM | POA: Diagnosis not present

## 2022-03-17 DIAGNOSIS — E039 Hypothyroidism, unspecified: Secondary | ICD-10-CM | POA: Diagnosis not present

## 2022-03-17 DIAGNOSIS — Z91018 Allergy to other foods: Secondary | ICD-10-CM | POA: Diagnosis not present

## 2022-03-17 DIAGNOSIS — M810 Age-related osteoporosis without current pathological fracture: Secondary | ICD-10-CM | POA: Diagnosis not present

## 2022-03-17 DIAGNOSIS — K219 Gastro-esophageal reflux disease without esophagitis: Secondary | ICD-10-CM | POA: Diagnosis not present

## 2022-03-17 DIAGNOSIS — E785 Hyperlipidemia, unspecified: Secondary | ICD-10-CM | POA: Diagnosis not present

## 2022-03-17 DIAGNOSIS — R002 Palpitations: Secondary | ICD-10-CM | POA: Diagnosis not present

## 2022-03-17 DIAGNOSIS — F411 Generalized anxiety disorder: Secondary | ICD-10-CM | POA: Diagnosis not present

## 2022-03-17 DIAGNOSIS — I480 Paroxysmal atrial fibrillation: Secondary | ICD-10-CM | POA: Diagnosis not present

## 2022-03-17 DIAGNOSIS — G250 Essential tremor: Secondary | ICD-10-CM | POA: Diagnosis not present

## 2022-03-17 DIAGNOSIS — K146 Glossodynia: Secondary | ICD-10-CM | POA: Diagnosis not present

## 2022-03-18 DIAGNOSIS — Z91018 Allergy to other foods: Secondary | ICD-10-CM | POA: Diagnosis not present

## 2022-03-18 DIAGNOSIS — K219 Gastro-esophageal reflux disease without esophagitis: Secondary | ICD-10-CM | POA: Diagnosis not present

## 2022-03-18 DIAGNOSIS — K146 Glossodynia: Secondary | ICD-10-CM | POA: Diagnosis not present

## 2022-03-18 DIAGNOSIS — R5381 Other malaise: Secondary | ICD-10-CM | POA: Diagnosis not present

## 2022-03-18 DIAGNOSIS — I480 Paroxysmal atrial fibrillation: Secondary | ICD-10-CM | POA: Diagnosis not present

## 2022-03-18 DIAGNOSIS — F411 Generalized anxiety disorder: Secondary | ICD-10-CM | POA: Diagnosis not present

## 2022-03-18 DIAGNOSIS — I10 Essential (primary) hypertension: Secondary | ICD-10-CM | POA: Diagnosis not present

## 2022-03-18 DIAGNOSIS — E034 Atrophy of thyroid (acquired): Secondary | ICD-10-CM | POA: Diagnosis not present

## 2022-03-18 DIAGNOSIS — Z950 Presence of cardiac pacemaker: Secondary | ICD-10-CM | POA: Diagnosis not present

## 2022-03-19 DIAGNOSIS — I480 Paroxysmal atrial fibrillation: Secondary | ICD-10-CM | POA: Diagnosis not present

## 2022-03-19 DIAGNOSIS — I10 Essential (primary) hypertension: Secondary | ICD-10-CM | POA: Diagnosis not present

## 2022-03-19 DIAGNOSIS — Z950 Presence of cardiac pacemaker: Secondary | ICD-10-CM | POA: Diagnosis not present

## 2022-03-19 DIAGNOSIS — R6889 Other general symptoms and signs: Secondary | ICD-10-CM | POA: Diagnosis not present

## 2022-03-20 DIAGNOSIS — I480 Paroxysmal atrial fibrillation: Secondary | ICD-10-CM | POA: Diagnosis not present

## 2022-03-20 DIAGNOSIS — I10 Essential (primary) hypertension: Secondary | ICD-10-CM | POA: Diagnosis not present

## 2022-03-20 DIAGNOSIS — K146 Glossodynia: Secondary | ICD-10-CM | POA: Diagnosis not present

## 2022-03-20 DIAGNOSIS — K219 Gastro-esophageal reflux disease without esophagitis: Secondary | ICD-10-CM | POA: Diagnosis not present

## 2022-03-20 DIAGNOSIS — M810 Age-related osteoporosis without current pathological fracture: Secondary | ICD-10-CM | POA: Diagnosis not present

## 2022-03-20 DIAGNOSIS — E034 Atrophy of thyroid (acquired): Secondary | ICD-10-CM | POA: Diagnosis not present

## 2022-03-20 DIAGNOSIS — Z91018 Allergy to other foods: Secondary | ICD-10-CM | POA: Diagnosis not present

## 2022-03-20 DIAGNOSIS — Z950 Presence of cardiac pacemaker: Secondary | ICD-10-CM | POA: Diagnosis not present

## 2022-04-01 DIAGNOSIS — I4891 Unspecified atrial fibrillation: Secondary | ICD-10-CM | POA: Diagnosis not present

## 2022-04-01 DIAGNOSIS — I480 Paroxysmal atrial fibrillation: Secondary | ICD-10-CM | POA: Diagnosis not present

## 2022-04-11 DIAGNOSIS — I4891 Unspecified atrial fibrillation: Secondary | ICD-10-CM | POA: Diagnosis not present

## 2022-04-11 DIAGNOSIS — J984 Other disorders of lung: Secondary | ICD-10-CM | POA: Diagnosis not present

## 2022-04-11 DIAGNOSIS — Z96653 Presence of artificial knee joint, bilateral: Secondary | ICD-10-CM | POA: Diagnosis not present

## 2022-04-11 DIAGNOSIS — R001 Bradycardia, unspecified: Secondary | ICD-10-CM | POA: Diagnosis not present

## 2022-04-11 DIAGNOSIS — F419 Anxiety disorder, unspecified: Secondary | ICD-10-CM | POA: Diagnosis not present

## 2022-04-11 DIAGNOSIS — Z950 Presence of cardiac pacemaker: Secondary | ICD-10-CM | POA: Diagnosis not present

## 2022-04-11 DIAGNOSIS — Z20822 Contact with and (suspected) exposure to covid-19: Secondary | ICD-10-CM | POA: Diagnosis not present

## 2022-04-11 DIAGNOSIS — E079 Disorder of thyroid, unspecified: Secondary | ICD-10-CM | POA: Diagnosis not present

## 2022-04-11 DIAGNOSIS — K219 Gastro-esophageal reflux disease without esophagitis: Secondary | ICD-10-CM | POA: Diagnosis not present

## 2022-04-11 DIAGNOSIS — Z9013 Acquired absence of bilateral breasts and nipples: Secondary | ICD-10-CM | POA: Diagnosis not present

## 2022-04-11 DIAGNOSIS — R000 Tachycardia, unspecified: Secondary | ICD-10-CM | POA: Diagnosis not present

## 2022-04-11 DIAGNOSIS — I495 Sick sinus syndrome: Secondary | ICD-10-CM | POA: Diagnosis not present

## 2022-04-11 DIAGNOSIS — Z853 Personal history of malignant neoplasm of breast: Secondary | ICD-10-CM | POA: Diagnosis not present

## 2022-04-11 DIAGNOSIS — R531 Weakness: Secondary | ICD-10-CM | POA: Diagnosis not present

## 2022-04-12 DIAGNOSIS — R000 Tachycardia, unspecified: Secondary | ICD-10-CM | POA: Diagnosis not present

## 2022-04-12 DIAGNOSIS — I4891 Unspecified atrial fibrillation: Secondary | ICD-10-CM | POA: Diagnosis not present

## 2022-04-12 DIAGNOSIS — Z9013 Acquired absence of bilateral breasts and nipples: Secondary | ICD-10-CM | POA: Diagnosis not present

## 2022-04-12 DIAGNOSIS — I495 Sick sinus syndrome: Secondary | ICD-10-CM | POA: Diagnosis not present

## 2022-04-12 DIAGNOSIS — F419 Anxiety disorder, unspecified: Secondary | ICD-10-CM | POA: Diagnosis not present

## 2022-04-12 DIAGNOSIS — E079 Disorder of thyroid, unspecified: Secondary | ICD-10-CM | POA: Diagnosis not present

## 2022-04-12 DIAGNOSIS — Z96653 Presence of artificial knee joint, bilateral: Secondary | ICD-10-CM | POA: Diagnosis not present

## 2022-04-12 DIAGNOSIS — K219 Gastro-esophageal reflux disease without esophagitis: Secondary | ICD-10-CM | POA: Diagnosis not present

## 2022-04-12 DIAGNOSIS — Z853 Personal history of malignant neoplasm of breast: Secondary | ICD-10-CM | POA: Diagnosis not present

## 2022-04-13 DIAGNOSIS — I495 Sick sinus syndrome: Secondary | ICD-10-CM | POA: Diagnosis not present

## 2022-04-13 DIAGNOSIS — I10 Essential (primary) hypertension: Secondary | ICD-10-CM | POA: Diagnosis not present

## 2022-04-13 DIAGNOSIS — I480 Paroxysmal atrial fibrillation: Secondary | ICD-10-CM | POA: Diagnosis not present

## 2022-04-28 DIAGNOSIS — D320 Benign neoplasm of cerebral meninges: Secondary | ICD-10-CM | POA: Diagnosis not present

## 2022-05-05 DIAGNOSIS — I480 Paroxysmal atrial fibrillation: Secondary | ICD-10-CM | POA: Diagnosis not present

## 2022-05-08 ENCOUNTER — Encounter: Payer: Self-pay | Admitting: Podiatry

## 2022-05-08 ENCOUNTER — Ambulatory Visit: Payer: Medicare PPO | Admitting: Podiatry

## 2022-05-08 DIAGNOSIS — M79609 Pain in unspecified limb: Secondary | ICD-10-CM

## 2022-05-08 DIAGNOSIS — B351 Tinea unguium: Secondary | ICD-10-CM | POA: Diagnosis not present

## 2022-05-08 DIAGNOSIS — Z9229 Personal history of other drug therapy: Secondary | ICD-10-CM | POA: Diagnosis not present

## 2022-05-08 NOTE — Progress Notes (Signed)
Subjective:  Patient ID: Alyssa Luna, female    DOB: [birth year],  MRN: [id number]  Alyssa Luna presents to clinic today for painful elongated mycotic toenails 1-5 bilaterally which are tender when wearing enclosed shoe gear. Pain is relieved with periodic professional debridement.  New problem(s): None.    PCP is Plotnikov, Evie Lacks, MD , and last visit was October 21, 2021.  Allergies  Allergen Reactions   Alpha-Gal Itching and Nausea Only    Per patient, cannot have alpha-gal since tic bite episode. Per patient, cannot have alpha-gal since tic bite episode. Per patient, cannot have alpha-gal since tic bite episode.   Beef (Bovine) Protein Other (See Comments)    Alpha-gal allergy testing positive June 2018   Galactose Nausea Only    Per patient, cannot have alpha-gal since tic bite episode.   Pravastatin Anaphylaxis and Other (See Comments)    Legs ache   Milk (Cow) Nausea Only and Rash   Alpha-D-Galactosidase Itching   Amlodipine     Leg swelling   Amlodipine-Atorvastatin     swelling   Cefdinir     Nausea from suspension   Colchicine Other (See Comments)   Doxycycline Other (See Comments)    Nausea - able to take w/food   Losartan     Intolerance    Shellfish Allergy Other (See Comments)    Unknown   Tikosyn [Dofetilide] Other (See Comments)    Unknown reaction and was told never to take it due to problems during sleep    Actonel [Risedronate Sodium] Other (See Comments)    Not known   Alendronate Other (See Comments)    NOT KNOWN   Augmentin [Amoxicillin-Pot Clavulanate] Rash    Has patient had a PCN reaction causing immediate rash, facial/tongue/throat swelling, SOB or lightheadedness with hypotension: Yes Has patient had a PCN reaction causing severe rash involving mucus membranes or skin necrosis:No Has patient had a PCN reaction that required hospitalization: No Has patient had a PCN reaction occurring within the last 10 years: No If all of the  above answers are "NO", then may proceed with Cephalosporin use.    Beef Allergy Rash   Cheese Rash   Ciprofloxacin Nausea Only   Evista [Raloxifene] Other (See Comments)    Unknown reaction   Flagyl [Metronidazole] Other (See Comments)    Not known   Fosamax [Alendronate Sodium] Other (See Comments)    NOT KNOWN   Gold-Containing Drug Products Other (See Comments)    Per Dr   Loma Sousa [Escitalopram Oxalate] Other (See Comments)    shaky   Other Rash   Pork Allergy Rash   Risedronate Other (See Comments)    Unknown reaction   Simvastatin Other (See Comments)    REACTION: leg cramps, weakness   Tramadol Other (See Comments)    Mental disturbance changes    Review of Systems: Negative except as noted in the HPI.  Objective: No changes noted in today's physical examination. Constitutional Alyssa Luna is a pleasant 88 y.o. Caucasian female, WD, WN in NAD. AAO x 3.   Vascular CFT immediate b/l LE. Palpable DP/PT pulses b/l LE. Digital hair sparse b/l. Skin temperature gradient WNL b/l. No pain with calf compression b/l. No edema noted b/l. Evidence of chronic venous insufficiency b/l LE. No cyanosis or clubbing noted b/l LE.  Neurologic Normal speech. Oriented to person, place, and time. Protective sensation intact 5/5 intact bilaterally with 10g monofilament b/l.  Dermatologic Pedal integument with normal turgor, texture and  tone b/l LE. No open wounds b/l. No interdigital macerations b/l. Toenails 1-5 b/l elongated, thickened, discolored with subungual debris. +Tenderness with dorsal palpation of nailplates.   Orthopedic: Normal muscle strength 5/5 to all lower extremity muscle groups bilaterally. Hammertoe(s) noted to the R 5th toe.[name] No pain, crepitus or joint limitation noted with ROM b/l LE.  Patient ambulates independently without assistive aids. No edema noted left hallux, no pain on palpation or ROM. No ecchymosis.   Radiographs: None  Assessment/Plan: 1. Pain due to  onychomycosis of nail   2. Hx of long term use of blood thinners      -Discussed and educated patient on foot care, especially with  regards to the vascular, neurological and musculoskeletal systems.  -Discussed supportive shoes at all times and checking feet regularly.  -Mechanically debrided all nails 1-5 bilateral using sterile nail nipper and filed with dremel without incident  -Answered all patient questions -Patient to return  in 3 months for at risk foot care -Patient advised to call the office if any problems or questions arise in the meantime.   No follow-ups on file.  Lorenda Peck, DPM

## 2022-05-15 DIAGNOSIS — Z91014 Allergy to mammalian meats: Secondary | ICD-10-CM | POA: Diagnosis not present

## 2022-05-20 DIAGNOSIS — I10 Essential (primary) hypertension: Secondary | ICD-10-CM | POA: Diagnosis not present

## 2022-05-20 DIAGNOSIS — I495 Sick sinus syndrome: Secondary | ICD-10-CM | POA: Diagnosis not present

## 2022-05-20 DIAGNOSIS — I480 Paroxysmal atrial fibrillation: Secondary | ICD-10-CM | POA: Diagnosis not present

## 2022-06-02 DIAGNOSIS — I495 Sick sinus syndrome: Secondary | ICD-10-CM | POA: Diagnosis not present

## 2022-06-02 DIAGNOSIS — I10 Essential (primary) hypertension: Secondary | ICD-10-CM | POA: Diagnosis not present

## 2022-06-02 DIAGNOSIS — I480 Paroxysmal atrial fibrillation: Secondary | ICD-10-CM | POA: Diagnosis not present

## 2022-06-03 DIAGNOSIS — Z91014 Allergy to mammalian meats: Secondary | ICD-10-CM | POA: Diagnosis not present

## 2022-06-11 DIAGNOSIS — R2232 Localized swelling, mass and lump, left upper limb: Secondary | ICD-10-CM | POA: Diagnosis not present

## 2022-06-11 DIAGNOSIS — M79602 Pain in left arm: Secondary | ICD-10-CM | POA: Diagnosis not present

## 2022-06-13 DIAGNOSIS — R5383 Other fatigue: Secondary | ICD-10-CM | POA: Diagnosis not present

## 2022-06-13 DIAGNOSIS — R300 Dysuria: Secondary | ICD-10-CM | POA: Diagnosis not present

## 2022-06-17 DIAGNOSIS — R600 Localized edema: Secondary | ICD-10-CM | POA: Diagnosis not present

## 2022-06-17 DIAGNOSIS — R3129 Other microscopic hematuria: Secondary | ICD-10-CM | POA: Diagnosis not present

## 2022-07-01 ENCOUNTER — Telehealth: Payer: Self-pay | Admitting: Internal Medicine

## 2022-07-01 NOTE — Telephone Encounter (Signed)
Left message for patient to call back to schedule Medicare Annual Wellness Visit   Last AWV  07/07/21  Please schedule at anytime with [facility] Lindstrom if patient calls the office back.     Any questions, please call me at [phone]

## 2022-07-22 DIAGNOSIS — Z8673 Personal history of transient ischemic attack (TIA), and cerebral infarction without residual deficits: Secondary | ICD-10-CM | POA: Diagnosis not present

## 2022-07-22 DIAGNOSIS — R202 Paresthesia of skin: Secondary | ICD-10-CM | POA: Diagnosis not present

## 2022-07-22 DIAGNOSIS — R200 Anesthesia of skin: Secondary | ICD-10-CM | POA: Diagnosis not present

## 2022-07-22 DIAGNOSIS — R918 Other nonspecific abnormal finding of lung field: Secondary | ICD-10-CM | POA: Diagnosis not present

## 2022-07-22 DIAGNOSIS — D320 Benign neoplasm of cerebral meninges: Secondary | ICD-10-CM | POA: Diagnosis not present

## 2022-07-22 DIAGNOSIS — Z950 Presence of cardiac pacemaker: Secondary | ICD-10-CM | POA: Diagnosis not present

## 2022-07-30 ENCOUNTER — Ambulatory Visit: Payer: Medicare PPO | Admitting: Podiatrist

## 2022-07-30 ENCOUNTER — Encounter: Payer: Self-pay | Admitting: Podiatrist

## 2022-07-30 DIAGNOSIS — M79609 Pain in unspecified limb: Secondary | ICD-10-CM

## 2022-07-30 DIAGNOSIS — B351 Tinea unguium: Secondary | ICD-10-CM

## 2022-07-30 NOTE — Telephone Encounter (Signed)
Forwarding to [employer] Prior [employer]

## 2022-07-30 NOTE — Progress Notes (Signed)
Subjective:  Patient ID: Alyssa Luna, female    DOB: 05/03/1934,  MRN: [id number]  Alyssa Luna presents to clinic today for painful elongated mycotic toenails 1-5 bilaterally which are tender when wearing enclosed shoe gear. Pain is relieved with periodic professional debridement.  New problem(s): None.    PCP is No primary care provider on file. , and last visit was October 21, 2021.  Allergies  Allergen Reactions   Alpha-Gal Itching and Nausea Only    Per patient, cannot have alpha-gal since tic bite episode. Per patient, cannot have alpha-gal since tic bite episode. Per patient, cannot have alpha-gal since tic bite episode.   Beef (Bovine) Protein Other (See Comments)    Alpha-gal allergy testing positive June 2018   Galactose Nausea Only    Per patient, cannot have alpha-gal since tic bite episode.   Pravastatin Anaphylaxis and Other (See Comments)    Legs ache   Milk (Cow) Nausea Only and Rash   Alpha-D-Galactosidase Itching   Amlodipine     Leg swelling   Amlodipine-Atorvastatin     swelling   Cefdinir     Nausea from suspension   Colchicine Other (See Comments)   Doxycycline Other (See Comments)    Nausea - able to take w/food   Losartan     Intolerance    Shellfish Allergy Other (See Comments)    Unknown   Tikosyn [Dofetilide] Other (See Comments)    Unknown reaction and was told never to take it due to problems during sleep    Actonel [Risedronate Sodium] Other (See Comments)    Not known   Alendronate Other (See Comments)    NOT KNOWN   Augmentin [Amoxicillin-Pot Clavulanate] Rash    Has patient had a PCN reaction causing immediate rash, facial/tongue/throat swelling, SOB or lightheadedness with hypotension: Yes Has patient had a PCN reaction causing severe rash involving mucus membranes or skin necrosis:No Has patient had a PCN reaction that required hospitalization: No Has patient had a PCN reaction occurring within the last 10 years: No If all  of the above answers are "NO", then may proceed with Cephalosporin use.    Beef Allergy Rash   Cheese Rash   Ciprofloxacin Nausea Only   Evista [Raloxifene] Other (See Comments)    Unknown reaction   Flagyl [Metronidazole] Other (See Comments)    Not known   Fosamax [Alendronate Sodium] Other (See Comments)    NOT KNOWN   Gold-Containing Drug Products Other (See Comments)    Per Dr   Loma Sousa [Escitalopram Oxalate] Other (See Comments)    shaky   Other Rash   Pork Allergy Rash   Risedronate Other (See Comments)    Unknown reaction   Simvastatin Other (See Comments)    REACTION: leg cramps, weakness   Tramadol Other (See Comments)    Mental disturbance changes    Review of Systems: Negative except as noted in the HPI.  Constitutional Alyssa Luna is a pleasant 88 y.o. Caucasian female, WD, WN in NAD. AAO x 3.   Vascular CFT immediate b/l LE. Palpable DP/PT pulses b/l LE. Digital hair sparse b/l. Skin temperature gradient WNL b/l. No pain with calf compression b/l. No edema noted b/l. Evidence of chronic venous insufficiency b/l LE. No cyanosis or clubbing noted b/l LE.  Neurologic Normal speech. Oriented to person, place, and time. Protective sensation intact 5/5 intact bilaterally with 10g monofilament b/l.  Dermatologic Pedal integument with normal turgor, texture and tone b/l LE. No open wounds  b/l. No interdigital macerations b/l. Toenails 1-5 b/l elongated, thickened, discolored with subungual debris. +Tenderness with dorsal palpation of nailplates.   Orthopedic: Normal muscle strength 5/5 to all lower extremity muscle groups bilaterally. Contracture Hammertoe(s) noted 1-5 bilateral.. No pain, crepitus or joint limitation noted with ROM b/l LE.  Flexible forefoot cavus foot noted bilateral.  Patient ambulates independently with the aid of a rolling walker. No edema noted left hallux, no pain on palpation or ROM. No ecchymosis.     Assessment/Plan:   ICD-10-CM   1. Pain  due to onychomycosis of nail  B35.1    M79.609         -Mechanically debrided all nails 1-5 bilateral using sterile nail nipper and filed with dremel without incident  -Answered all patient questions -Patient to return  in 3 months for at risk foot care -Patient advised to call the office if any problems or questions arise in the meantime.   No follow-ups on file.  Bronson Ing, DPM

## 2022-07-30 NOTE — Telephone Encounter (Signed)
Prolia VOB initiated via [email or web address] 

## 2022-07-31 ENCOUNTER — Ambulatory Visit: Payer: Medicare PPO | Admitting: Podiatrist

## 2022-08-19 DIAGNOSIS — R5381 Other malaise: Secondary | ICD-10-CM | POA: Diagnosis not present

## 2022-08-19 DIAGNOSIS — R634 Abnormal weight loss: Secondary | ICD-10-CM | POA: Diagnosis not present

## 2022-08-19 DIAGNOSIS — Z91018 Allergy to other foods: Secondary | ICD-10-CM | POA: Diagnosis not present

## 2022-08-19 DIAGNOSIS — Z6820 Body mass index (BMI) 20.0-20.9, adult: Secondary | ICD-10-CM | POA: Diagnosis not present

## 2022-08-25 DIAGNOSIS — Z950 Presence of cardiac pacemaker: Secondary | ICD-10-CM | POA: Diagnosis not present

## 2022-08-25 DIAGNOSIS — I495 Sick sinus syndrome: Secondary | ICD-10-CM | POA: Diagnosis not present

## 2022-08-28 DIAGNOSIS — I482 Chronic atrial fibrillation, unspecified: Secondary | ICD-10-CM | POA: Diagnosis not present

## 2022-08-28 DIAGNOSIS — G603 Idiopathic progressive neuropathy: Secondary | ICD-10-CM | POA: Diagnosis not present

## 2022-08-28 DIAGNOSIS — Z950 Presence of cardiac pacemaker: Secondary | ICD-10-CM | POA: Diagnosis not present

## 2022-08-28 DIAGNOSIS — Z8673 Personal history of transient ischemic attack (TIA), and cerebral infarction without residual deficits: Secondary | ICD-10-CM | POA: Diagnosis not present

## 2022-08-28 DIAGNOSIS — G250 Essential tremor: Secondary | ICD-10-CM | POA: Diagnosis not present

## 2022-08-28 DIAGNOSIS — D320 Benign neoplasm of cerebral meninges: Secondary | ICD-10-CM | POA: Diagnosis not present

## 2022-08-28 DIAGNOSIS — R202 Paresthesia of skin: Secondary | ICD-10-CM | POA: Diagnosis not present

## 2022-08-28 DIAGNOSIS — R200 Anesthesia of skin: Secondary | ICD-10-CM | POA: Diagnosis not present

## 2022-09-14 DIAGNOSIS — R2681 Unsteadiness on feet: Secondary | ICD-10-CM | POA: Diagnosis not present

## 2022-09-14 DIAGNOSIS — M545 Low back pain, unspecified: Secondary | ICD-10-CM | POA: Diagnosis not present

## 2022-09-14 DIAGNOSIS — M6281 Muscle weakness (generalized): Secondary | ICD-10-CM | POA: Diagnosis not present

## 2022-09-23 DIAGNOSIS — K219 Gastro-esophageal reflux disease without esophagitis: Secondary | ICD-10-CM | POA: Diagnosis not present

## 2022-09-23 DIAGNOSIS — I10 Essential (primary) hypertension: Secondary | ICD-10-CM | POA: Diagnosis not present

## 2022-09-23 DIAGNOSIS — I480 Paroxysmal atrial fibrillation: Secondary | ICD-10-CM | POA: Diagnosis not present

## 2022-09-23 DIAGNOSIS — R600 Localized edema: Secondary | ICD-10-CM | POA: Diagnosis not present

## 2022-09-23 DIAGNOSIS — K5901 Slow transit constipation: Secondary | ICD-10-CM | POA: Diagnosis not present

## 2022-09-23 DIAGNOSIS — Z950 Presence of cardiac pacemaker: Secondary | ICD-10-CM | POA: Diagnosis not present

## 2022-09-28 DIAGNOSIS — H527 Unspecified disorder of refraction: Secondary | ICD-10-CM | POA: Diagnosis not present

## 2022-09-28 DIAGNOSIS — H43813 Vitreous degeneration, bilateral: Secondary | ICD-10-CM | POA: Diagnosis not present

## 2022-09-28 DIAGNOSIS — H16143 Punctate keratitis, bilateral: Secondary | ICD-10-CM | POA: Diagnosis not present

## 2022-09-28 DIAGNOSIS — Z961 Presence of intraocular lens: Secondary | ICD-10-CM | POA: Diagnosis not present

## 2022-10-12 DIAGNOSIS — Z91014 Allergy to mammalian meats: Secondary | ICD-10-CM | POA: Diagnosis not present

## 2022-10-14 DIAGNOSIS — H04123 Dry eye syndrome of bilateral lacrimal glands: Secondary | ICD-10-CM | POA: Diagnosis not present

## 2022-10-14 DIAGNOSIS — R600 Localized edema: Secondary | ICD-10-CM | POA: Diagnosis not present

## 2022-10-14 DIAGNOSIS — M48061 Spinal stenosis, lumbar region without neurogenic claudication: Secondary | ICD-10-CM | POA: Diagnosis not present

## 2022-10-14 DIAGNOSIS — K219 Gastro-esophageal reflux disease without esophagitis: Secondary | ICD-10-CM | POA: Diagnosis not present

## 2022-10-14 DIAGNOSIS — Z91018 Allergy to other foods: Secondary | ICD-10-CM | POA: Diagnosis not present

## 2022-10-14 DIAGNOSIS — Z872 Personal history of diseases of the skin and subcutaneous tissue: Secondary | ICD-10-CM | POA: Diagnosis not present

## 2022-10-14 DIAGNOSIS — Z09 Encounter for follow-up examination after completed treatment for conditions other than malignant neoplasm: Secondary | ICD-10-CM | POA: Diagnosis not present

## 2022-10-21 DIAGNOSIS — Z950 Presence of cardiac pacemaker: Secondary | ICD-10-CM | POA: Diagnosis not present

## 2022-10-21 DIAGNOSIS — I10 Essential (primary) hypertension: Secondary | ICD-10-CM | POA: Diagnosis not present

## 2022-10-21 DIAGNOSIS — Z5181 Encounter for therapeutic drug level monitoring: Secondary | ICD-10-CM | POA: Diagnosis not present

## 2022-10-21 DIAGNOSIS — I4891 Unspecified atrial fibrillation: Secondary | ICD-10-CM | POA: Diagnosis not present

## 2022-10-21 DIAGNOSIS — I480 Paroxysmal atrial fibrillation: Secondary | ICD-10-CM | POA: Diagnosis not present

## 2022-10-21 DIAGNOSIS — I495 Sick sinus syndrome: Secondary | ICD-10-CM | POA: Diagnosis not present

## 2022-10-21 DIAGNOSIS — Z79899 Other long term (current) drug therapy: Secondary | ICD-10-CM | POA: Diagnosis not present

## 2022-10-21 DIAGNOSIS — Z7901 Long term (current) use of anticoagulants: Secondary | ICD-10-CM | POA: Diagnosis not present

## 2022-10-21 DIAGNOSIS — Z1330 Encounter for screening examination for mental health and behavioral disorders, unspecified: Secondary | ICD-10-CM | POA: Diagnosis not present

## 2022-10-28 DIAGNOSIS — Z91014 Allergy to mammalian meats: Secondary | ICD-10-CM | POA: Diagnosis not present

## 2022-10-30 ENCOUNTER — Encounter: Payer: Self-pay | Admitting: Podiatry

## 2022-10-30 ENCOUNTER — Ambulatory Visit: Payer: Medicare PPO | Admitting: Podiatry

## 2022-10-30 DIAGNOSIS — M79676 Pain in unspecified toe(s): Secondary | ICD-10-CM | POA: Diagnosis not present

## 2022-10-30 DIAGNOSIS — Z9229 Personal history of other drug therapy: Secondary | ICD-10-CM | POA: Diagnosis not present

## 2022-10-30 DIAGNOSIS — B351 Tinea unguium: Secondary | ICD-10-CM | POA: Diagnosis not present

## 2022-10-30 NOTE — Progress Notes (Signed)
Subjective:  Patient ID: Alyssa Luna, female    DOB: 12-20-1933,  MRN: [id number]  LUUL TORELLI presents to clinic today for painful elongated mycotic toenails 1-5 bilaterally which are tender when wearing enclosed shoe gear. Pain is relieved with periodic professional debridement.  New problem(s): None.    PCP is Default, Provider, MD , and last visit was October 21, 2021.  Allergies  Allergen Reactions   Alpha-Gal Itching and Nausea Only    Per patient, cannot have alpha-gal since tic bite episode. Per patient, cannot have alpha-gal since tic bite episode. Per patient, cannot have alpha-gal since tic bite episode.   Beef (Bovine) Protein Other (See Comments)    Alpha-gal allergy testing positive June 2018   Galactose Nausea Only    Per patient, cannot have alpha-gal since tic bite episode.   Pravastatin Anaphylaxis and Other (See Comments)    Legs ache   Milk (Cow) Nausea Only and Rash   Alpha-D-Galactosidase Itching   Amlodipine     Leg swelling   Amlodipine-Atorvastatin     swelling   Cefdinir     Nausea from suspension   Colchicine Other (See Comments)   Doxycycline Other (See Comments)    Nausea - able to take w/food   Losartan     Intolerance    Shellfish Allergy Other (See Comments)    Unknown   Tikosyn [Dofetilide] Other (See Comments)    Unknown reaction and was told never to take it due to problems during sleep    Actonel [Risedronate Sodium] Other (See Comments)    Not known   Alendronate Other (See Comments)    NOT KNOWN   Augmentin [Amoxicillin-Pot Clavulanate] Rash    Has patient had a PCN reaction causing immediate rash, facial/tongue/throat swelling, SOB or lightheadedness with hypotension: Yes Has patient had a PCN reaction causing severe rash involving mucus membranes or skin necrosis:No Has patient had a PCN reaction that required hospitalization: No Has patient had a PCN reaction occurring within the last 10 years: No If all of the above  answers are "NO", then may proceed with Cephalosporin use.    Beef Allergy Rash   Cheese Rash   Ciprofloxacin Nausea Only   Evista [Raloxifene] Other (See Comments)    Unknown reaction   Flagyl [Metronidazole] Other (See Comments)    Not known   Fosamax [Alendronate Sodium] Other (See Comments)    NOT KNOWN   Gold-Containing Drug Products Other (See Comments)    Per Dr   Loma Sousa [Escitalopram Oxalate] Other (See Comments)    shaky   Other Rash   Pork Allergy Rash   Risedronate Other (See Comments)    Unknown reaction   Simvastatin Other (See Comments)    REACTION: leg cramps, weakness   Tramadol Other (See Comments)    Mental disturbance changes    Review of Systems: Negative except as noted in the HPI.  Objective: No changes noted in today's physical examination. Constitutional LEKEYSHA BENGE is a pleasant 89 y.o. Caucasian female, WD, WN in NAD. AAO x 3.   Vascular CFT immediate b/l LE. Palpable DP/PT pulses b/l LE. Digital hair sparse b/l. Skin temperature gradient WNL b/l. No pain with calf compression b/l. No edema noted b/l. Evidence of chronic venous insufficiency b/l LE. No cyanosis or clubbing noted b/l LE.  Neurologic Normal speech. Oriented to person, place, and time. Protective sensation intact 5/5 intact bilaterally with 10g monofilament b/l.  Dermatologic Pedal integument with normal turgor, texture and tone  b/l LE. No open wounds b/l. No interdigital macerations b/l. Toenails 1-5 b/l elongated, thickened, discolored with subungual debris. +Tenderness with dorsal palpation of nailplates.   Orthopedic: Normal muscle strength 5/5 to all lower extremity muscle groups bilaterally. Hammertoe(s) noted to the R 5th toe.[name] No pain, crepitus or joint limitation noted with ROM b/l LE.  Patient ambulates independently without assistive aids. No edema noted left hallux, no pain on palpation or ROM. No ecchymosis.   Radiographs: None  Assessment/Plan: 1. Pain due to  onychomycosis of nail   2. Hx of long term use of blood thinners      -Discussed and educated patient on foot care, especially with  regards to the vascular, neurological and musculoskeletal systems.  -Discussed supportive shoes at all times and checking feet regularly.  -Mechanically debrided all nails 1-5 bilateral using sterile nail nipper and filed with dremel without incident  -Answered all patient questions -Patient to return  in 3 months for at risk foot care -Patient advised to call the office if any problems or questions arise in the meantime.   Return in about 3 months (around 01/30/2023) for rfc.  Lorenda Peck, DPM

## 2022-11-04 DIAGNOSIS — K219 Gastro-esophageal reflux disease without esophagitis: Secondary | ICD-10-CM | POA: Diagnosis not present

## 2022-11-04 DIAGNOSIS — E034 Atrophy of thyroid (acquired): Secondary | ICD-10-CM | POA: Diagnosis not present

## 2022-11-04 DIAGNOSIS — I480 Paroxysmal atrial fibrillation: Secondary | ICD-10-CM | POA: Diagnosis not present

## 2022-11-04 DIAGNOSIS — M48061 Spinal stenosis, lumbar region without neurogenic claudication: Secondary | ICD-10-CM | POA: Diagnosis not present

## 2022-11-04 DIAGNOSIS — M810 Age-related osteoporosis without current pathological fracture: Secondary | ICD-10-CM | POA: Diagnosis not present

## 2022-11-04 DIAGNOSIS — Z950 Presence of cardiac pacemaker: Secondary | ICD-10-CM | POA: Diagnosis not present

## 2022-11-04 DIAGNOSIS — I10 Essential (primary) hypertension: Secondary | ICD-10-CM | POA: Diagnosis not present

## 2022-11-04 DIAGNOSIS — Z91018 Allergy to other foods: Secondary | ICD-10-CM | POA: Diagnosis not present

## 2022-11-04 DIAGNOSIS — R600 Localized edema: Secondary | ICD-10-CM | POA: Diagnosis not present

## 2022-11-18 DIAGNOSIS — I480 Paroxysmal atrial fibrillation: Secondary | ICD-10-CM | POA: Diagnosis not present

## 2022-11-18 DIAGNOSIS — K5901 Slow transit constipation: Secondary | ICD-10-CM | POA: Diagnosis not present

## 2022-11-18 DIAGNOSIS — E559 Vitamin D deficiency, unspecified: Secondary | ICD-10-CM | POA: Diagnosis not present

## 2022-12-01 DIAGNOSIS — I495 Sick sinus syndrome: Secondary | ICD-10-CM | POA: Diagnosis not present

## 2022-12-01 DIAGNOSIS — Z950 Presence of cardiac pacemaker: Secondary | ICD-10-CM | POA: Diagnosis not present

## 2022-12-09 DIAGNOSIS — E034 Atrophy of thyroid (acquired): Secondary | ICD-10-CM | POA: Diagnosis not present

## 2022-12-09 DIAGNOSIS — J302 Other seasonal allergic rhinitis: Secondary | ICD-10-CM | POA: Diagnosis not present

## 2022-12-09 DIAGNOSIS — M48061 Spinal stenosis, lumbar region without neurogenic claudication: Secondary | ICD-10-CM | POA: Diagnosis not present

## 2022-12-09 DIAGNOSIS — Z91018 Allergy to other foods: Secondary | ICD-10-CM | POA: Diagnosis not present

## 2022-12-09 DIAGNOSIS — F320 Major depressive disorder, single episode, mild: Secondary | ICD-10-CM | POA: Diagnosis not present

## 2022-12-09 DIAGNOSIS — F411 Generalized anxiety disorder: Secondary | ICD-10-CM | POA: Diagnosis not present

## 2022-12-09 DIAGNOSIS — E559 Vitamin D deficiency, unspecified: Secondary | ICD-10-CM | POA: Diagnosis not present

## 2022-12-09 DIAGNOSIS — I480 Paroxysmal atrial fibrillation: Secondary | ICD-10-CM | POA: Diagnosis not present

## 2022-12-10 DIAGNOSIS — M545 Low back pain, unspecified: Secondary | ICD-10-CM | POA: Diagnosis not present

## 2022-12-10 DIAGNOSIS — M546 Pain in thoracic spine: Secondary | ICD-10-CM | POA: Diagnosis not present

## 2022-12-27 DIAGNOSIS — J3089 Other allergic rhinitis: Secondary | ICD-10-CM | POA: Diagnosis not present

## 2022-12-27 DIAGNOSIS — J019 Acute sinusitis, unspecified: Secondary | ICD-10-CM | POA: Diagnosis not present

## 2022-12-27 DIAGNOSIS — B9689 Other specified bacterial agents as the cause of diseases classified elsewhere: Secondary | ICD-10-CM | POA: Diagnosis not present

## 2023-01-06 DIAGNOSIS — I10 Essential (primary) hypertension: Secondary | ICD-10-CM | POA: Diagnosis not present

## 2023-01-06 DIAGNOSIS — I480 Paroxysmal atrial fibrillation: Secondary | ICD-10-CM | POA: Diagnosis not present

## 2023-01-08 DIAGNOSIS — I10 Essential (primary) hypertension: Secondary | ICD-10-CM | POA: Diagnosis not present

## 2023-01-20 DIAGNOSIS — I480 Paroxysmal atrial fibrillation: Secondary | ICD-10-CM | POA: Diagnosis not present

## 2023-01-20 DIAGNOSIS — F411 Generalized anxiety disorder: Secondary | ICD-10-CM | POA: Diagnosis not present

## 2023-01-20 DIAGNOSIS — G250 Essential tremor: Secondary | ICD-10-CM | POA: Diagnosis not present

## 2023-01-20 DIAGNOSIS — J302 Other seasonal allergic rhinitis: Secondary | ICD-10-CM | POA: Diagnosis not present

## 2023-01-22 ENCOUNTER — Encounter: Payer: Self-pay | Admitting: Podiatry

## 2023-01-22 ENCOUNTER — Ambulatory Visit: Payer: Medicare PPO | Admitting: Podiatry

## 2023-01-22 DIAGNOSIS — Z9229 Personal history of other drug therapy: Secondary | ICD-10-CM | POA: Diagnosis not present

## 2023-01-22 DIAGNOSIS — M79609 Pain in unspecified limb: Secondary | ICD-10-CM

## 2023-01-22 DIAGNOSIS — B351 Tinea unguium: Secondary | ICD-10-CM

## 2023-01-22 NOTE — Progress Notes (Signed)
Subjective:  Patient ID: Alyssa Luna, female    DOB: 1934-03-23,  MRN: [id number]  Alyssa Luna presents to clinic today for painful elongated mycotic toenails 1-5 bilaterally which are tender when wearing enclosed shoe gear. Pain is relieved with periodic professional debridement.  New problem(s): None.    PCP is Default, Provider, MD , and last visit was October 21, 2021.  Allergies  Allergen Reactions   Alpha-Gal Itching and Nausea Only    Per patient, cannot have alpha-gal since tic bite episode. Per patient, cannot have alpha-gal since tic bite episode. Per patient, cannot have alpha-gal since tic bite episode.   Beef (Bovine) Protein Other (See Comments)    Alpha-gal allergy testing positive June 2018   Galactose Nausea Only    Per patient, cannot have alpha-gal since tic bite episode.   Pravastatin Anaphylaxis and Other (See Comments)    Legs ache   Milk (Cow) Nausea Only and Rash   Alpha-D-Galactosidase Itching   Amlodipine     Leg swelling   Amlodipine-Atorvastatin     swelling   Cefdinir     Nausea from suspension   Colchicine Other (See Comments)   Doxycycline Other (See Comments)    Nausea - able to take w/food   Losartan     Intolerance    Shellfish Allergy Other (See Comments)    Unknown   Tikosyn [Dofetilide] Other (See Comments)    Unknown reaction and was told never to take it due to problems during sleep    Actonel [Risedronate Sodium] Other (See Comments)    Not known   Alendronate Other (See Comments)    NOT KNOWN   Augmentin [Amoxicillin-Pot Clavulanate] Rash    Has patient had a PCN reaction causing immediate rash, facial/tongue/throat swelling, SOB or lightheadedness with hypotension: Yes Has patient had a PCN reaction causing severe rash involving mucus membranes or skin necrosis:No Has patient had a PCN reaction that required hospitalization: No Has patient had a PCN reaction occurring within the last 10 years: No If all of the above  answers are "NO", then may proceed with Cephalosporin use.    Beef Allergy Rash   Cheese Rash   Ciprofloxacin Nausea Only   Evista [Raloxifene] Other (See Comments)    Unknown reaction   Flagyl [Metronidazole] Other (See Comments)    Not known   Fosamax [Alendronate Sodium] Other (See Comments)    NOT KNOWN   Gold-Containing Drug Products Other (See Comments)    Per Dr   Judye Bos [Escitalopram Oxalate] Other (See Comments)    shaky   Other Rash   Pork Allergy Rash   Risedronate Other (See Comments)    Unknown reaction   Simvastatin Other (See Comments)    REACTION: leg cramps, weakness   Tramadol Other (See Comments)    Mental disturbance changes    Review of Systems: Negative except as noted in the HPI.  Objective: No changes noted in today's physical examination. Constitutional Alyssa Luna is a pleasant 89 y.o. Caucasian female, WD, WN in NAD. AAO x 3.   Vascular CFT immediate b/l LE. Palpable DP/PT pulses b/l LE. Digital hair sparse b/l. Skin temperature gradient WNL b/l. No pain with calf compression b/l. No edema noted b/l. Evidence of chronic venous insufficiency b/l LE. No cyanosis or clubbing noted b/l LE.  Neurologic Normal speech. Oriented to person, place, and time. Protective sensation intact 5/5 intact bilaterally with 10g monofilament b/l.  Dermatologic Pedal integument with normal turgor, texture and tone  b/l LE. No open wounds b/l. No interdigital macerations b/l. Toenails 1-5 b/l elongated, thickened, discolored with subungual debris. +Tenderness with dorsal palpation of nailplates.   Orthopedic: Normal muscle strength 5/5 to all lower extremity muscle groups bilaterally. Hammertoe(s) noted to the R 5th toe.[name] No pain, crepitus or joint limitation noted with ROM b/l LE.  Patient ambulates independently without assistive aids. No edema noted left hallux, no pain on palpation or ROM. No ecchymosis.   Radiographs: None  Assessment/Plan: 1. Pain due to  onychomycosis of nail   2. Hx of long term use of blood thinners      -Discussed and educated patient on foot care, especially with  regards to the vascular, neurological and musculoskeletal systems.  -Discussed supportive shoes at all times and checking feet regularly.  -Mechanically debrided all nails 1-5 bilateral using sterile nail nipper and filed with dremel without incident  -Answered all patient questions -Patient to return  in 3 months for at risk foot care -Patient advised to call the office if any problems or questions arise in the meantime.   No follow-ups on file.  Louann Sjogren, DPM

## 2023-01-27 DIAGNOSIS — I480 Paroxysmal atrial fibrillation: Secondary | ICD-10-CM | POA: Diagnosis not present

## 2023-01-27 DIAGNOSIS — M48061 Spinal stenosis, lumbar region without neurogenic claudication: Secondary | ICD-10-CM | POA: Diagnosis not present

## 2023-02-03 DIAGNOSIS — I480 Paroxysmal atrial fibrillation: Secondary | ICD-10-CM | POA: Diagnosis not present

## 2023-02-10 DIAGNOSIS — I480 Paroxysmal atrial fibrillation: Secondary | ICD-10-CM | POA: Diagnosis not present

## 2023-02-10 DIAGNOSIS — M48061 Spinal stenosis, lumbar region without neurogenic claudication: Secondary | ICD-10-CM | POA: Diagnosis not present

## 2023-04-23 ENCOUNTER — Encounter: Payer: Self-pay | Admitting: Podiatry

## 2023-04-23 ENCOUNTER — Ambulatory Visit: Payer: Medicare PPO | Admitting: Podiatry

## 2023-04-23 DIAGNOSIS — Z9229 Personal history of other drug therapy: Secondary | ICD-10-CM

## 2023-04-23 DIAGNOSIS — B351 Tinea unguium: Secondary | ICD-10-CM | POA: Diagnosis not present

## 2023-04-23 DIAGNOSIS — M79609 Pain in unspecified limb: Secondary | ICD-10-CM | POA: Diagnosis not present

## 2023-04-23 NOTE — Progress Notes (Signed)
Subjective:  Patient ID: Alyssa Luna, female    DOB: 1934-03-23,  MRN: [id number]  Alyssa Luna presents to clinic today for painful elongated mycotic toenails 1-5 bilaterally which are tender when wearing enclosed shoe gear. Pain is relieved with periodic professional debridement.  New problem(s): None.    PCP is Default, Provider, MD , and last visit was October 21, 2021.  Allergies  Allergen Reactions   Alpha-Gal Itching and Nausea Only    Per patient, cannot have alpha-gal since tic bite episode. Per patient, cannot have alpha-gal since tic bite episode. Per patient, cannot have alpha-gal since tic bite episode.   Beef (Bovine) Protein Other (See Comments)    Alpha-gal allergy testing positive June 2018   Galactose Nausea Only    Per patient, cannot have alpha-gal since tic bite episode.   Pravastatin Anaphylaxis and Other (See Comments)    Legs ache   Milk (Cow) Nausea Only and Rash   Alpha-D-Galactosidase Itching   Amlodipine     Leg swelling   Amlodipine-Atorvastatin     swelling   Cefdinir     Nausea from suspension   Colchicine Other (See Comments)   Doxycycline Other (See Comments)    Nausea - able to take w/food   Losartan     Intolerance    Shellfish Allergy Other (See Comments)    Unknown   Tikosyn [Dofetilide] Other (See Comments)    Unknown reaction and was told never to take it due to problems during sleep    Actonel [Risedronate Sodium] Other (See Comments)    Not known   Alendronate Other (See Comments)    NOT KNOWN   Augmentin [Amoxicillin-Pot Clavulanate] Rash    Has patient had a PCN reaction causing immediate rash, facial/tongue/throat swelling, SOB or lightheadedness with hypotension: Yes Has patient had a PCN reaction causing severe rash involving mucus membranes or skin necrosis:No Has patient had a PCN reaction that required hospitalization: No Has patient had a PCN reaction occurring within the last 10 years: No If all of the above  answers are "NO", then may proceed with Cephalosporin use.    Beef Allergy Rash   Cheese Rash   Ciprofloxacin Nausea Only   Evista [Raloxifene] Other (See Comments)    Unknown reaction   Flagyl [Metronidazole] Other (See Comments)    Not known   Fosamax [Alendronate Sodium] Other (See Comments)    NOT KNOWN   Gold-Containing Drug Products Other (See Comments)    Per Dr   Judye Bos [Escitalopram Oxalate] Other (See Comments)    shaky   Other Rash   Pork Allergy Rash   Risedronate Other (See Comments)    Unknown reaction   Simvastatin Other (See Comments)    REACTION: leg cramps, weakness   Tramadol Other (See Comments)    Mental disturbance changes    Review of Systems: Negative except as noted in the HPI.  Objective: No changes noted in today's physical examination. Constitutional Alyssa Luna is a pleasant 89 y.o. Caucasian female, WD, WN in NAD. AAO x 3.   Vascular CFT immediate b/l LE. Palpable DP/PT pulses b/l LE. Digital hair sparse b/l. Skin temperature gradient WNL b/l. No pain with calf compression b/l. No edema noted b/l. Evidence of chronic venous insufficiency b/l LE. No cyanosis or clubbing noted b/l LE.  Neurologic Normal speech. Oriented to person, place, and time. Protective sensation intact 5/5 intact bilaterally with 10g monofilament b/l.  Dermatologic Pedal integument with normal turgor, texture and tone  b/l LE. No open wounds b/l. No interdigital macerations b/l. Toenails 1-5 b/l elongated, thickened, discolored with subungual debris. +Tenderness with dorsal palpation of nailplates.   Orthopedic: Normal muscle strength 5/5 to all lower extremity muscle groups bilaterally. Hammertoe(s) noted to the R 5th toe.[name] No pain, crepitus or joint limitation noted with ROM b/l LE.  Patient ambulates independently without assistive aids. No edema noted left hallux, no pain on palpation or ROM. No ecchymosis.   Radiographs: None  Assessment/Plan: 1. Pain due to  onychomycosis of nail   2. Hx of long term use of blood thinners      -Discussed and educated patient on foot care, especially with  regards to the vascular, neurological and musculoskeletal systems.  -Discussed supportive shoes at all times and checking feet regularly.  -Mechanically debrided all nails 1-5 bilateral using sterile nail nipper and filed with dremel without incident  -Answered all patient questions -Patient to return  in 3 months for at risk foot care -Patient advised to call the office if any problems or questions arise in the meantime.   No follow-ups on file.  Louann Sjogren, DPM

## 2023-07-23 ENCOUNTER — Encounter: Payer: Self-pay | Admitting: Podiatry

## 2023-07-23 ENCOUNTER — Ambulatory Visit: Payer: Medicare PPO | Admitting: Podiatry

## 2023-07-23 DIAGNOSIS — B351 Tinea unguium: Secondary | ICD-10-CM | POA: Diagnosis not present

## 2023-07-23 DIAGNOSIS — M79609 Pain in unspecified limb: Secondary | ICD-10-CM | POA: Diagnosis not present

## 2023-07-23 DIAGNOSIS — Z9229 Personal history of other drug therapy: Secondary | ICD-10-CM

## 2023-07-23 NOTE — Progress Notes (Signed)
Subjective:  Patient ID: Alyssa Luna, female    DOB: 1934-03-23,  MRN: [id number]  REBECKA OELKERS presents to clinic today for painful elongated mycotic toenails 1-5 bilaterally which are tender when wearing enclosed shoe gear. Pain is relieved with periodic professional debridement.  New problem(s): None.    PCP is Default, Provider, MD , and last visit was October 21, 2021.  Allergies  Allergen Reactions   Alpha-Gal Itching and Nausea Only    Per patient, cannot have alpha-gal since tic bite episode. Per patient, cannot have alpha-gal since tic bite episode. Per patient, cannot have alpha-gal since tic bite episode.   Beef (Bovine) Protein Other (See Comments)    Alpha-gal allergy testing positive June 2018   Galactose Nausea Only    Per patient, cannot have alpha-gal since tic bite episode.   Pravastatin Anaphylaxis and Other (See Comments)    Legs ache   Milk (Cow) Nausea Only and Rash   Alpha-D-Galactosidase Itching   Amlodipine     Leg swelling   Amlodipine-Atorvastatin     swelling   Cefdinir     Nausea from suspension   Colchicine Other (See Comments)   Doxycycline Other (See Comments)    Nausea - able to take w/food   Losartan     Intolerance    Shellfish Allergy Other (See Comments)    Unknown   Tikosyn [Dofetilide] Other (See Comments)    Unknown reaction and was told never to take it due to problems during sleep    Actonel [Risedronate Sodium] Other (See Comments)    Not known   Alendronate Other (See Comments)    NOT KNOWN   Augmentin [Amoxicillin-Pot Clavulanate] Rash    Has patient had a PCN reaction causing immediate rash, facial/tongue/throat swelling, SOB or lightheadedness with hypotension: Yes Has patient had a PCN reaction causing severe rash involving mucus membranes or skin necrosis:No Has patient had a PCN reaction that required hospitalization: No Has patient had a PCN reaction occurring within the last 10 years: No If all of the above  answers are "NO", then may proceed with Cephalosporin use.    Beef Allergy Rash   Cheese Rash   Ciprofloxacin Nausea Only   Evista [Raloxifene] Other (See Comments)    Unknown reaction   Flagyl [Metronidazole] Other (See Comments)    Not known   Fosamax [Alendronate Sodium] Other (See Comments)    NOT KNOWN   Gold-Containing Drug Products Other (See Comments)    Per Dr   Judye Bos [Escitalopram Oxalate] Other (See Comments)    shaky   Other Rash   Pork Allergy Rash   Risedronate Other (See Comments)    Unknown reaction   Simvastatin Other (See Comments)    REACTION: leg cramps, weakness   Tramadol Other (See Comments)    Mental disturbance changes    Review of Systems: Negative except as noted in the HPI.  Objective: No changes noted in today's physical examination. Constitutional MARLETTE CURVIN is a pleasant 89 y.o. Caucasian female, WD, WN in NAD. AAO x 3.   Vascular CFT immediate b/l LE. Palpable DP/PT pulses b/l LE. Digital hair sparse b/l. Skin temperature gradient WNL b/l. No pain with calf compression b/l. No edema noted b/l. Evidence of chronic venous insufficiency b/l LE. No cyanosis or clubbing noted b/l LE.  Neurologic Normal speech. Oriented to person, place, and time. Protective sensation intact 5/5 intact bilaterally with 10g monofilament b/l.  Dermatologic Pedal integument with normal turgor, texture and tone  b/l LE. No open wounds b/l. No interdigital macerations b/l. Toenails 1-5 b/l elongated, thickened, discolored with subungual debris. +Tenderness with dorsal palpation of nailplates.   Orthopedic: Normal muscle strength 5/5 to all lower extremity muscle groups bilaterally. Hammertoe(s) noted to the R 5th toe.[name] No pain, crepitus or joint limitation noted with ROM b/l LE.  Patient ambulates independently without assistive aids. No edema noted left hallux, no pain on palpation or ROM. No ecchymosis.   Radiographs: None  Assessment/Plan: 1. Pain due to  onychomycosis of nail   2. Hx of long term use of blood thinners      -Discussed and educated patient on foot care, especially with  regards to the vascular, neurological and musculoskeletal systems.  -Discussed supportive shoes at all times and checking feet regularly.  -Mechanically debrided all nails 1-5 bilateral using sterile nail nipper and filed with dremel without incident  -Answered all patient questions -Patient to return  in 3 months for at risk foot care -Patient advised to call the office if any problems or questions arise in the meantime.   No follow-ups on file.  Louann Sjogren, DPM

## 2023-10-28 ENCOUNTER — Encounter: Payer: Self-pay | Admitting: Podiatry

## 2023-10-28 ENCOUNTER — Ambulatory Visit: Payer: Medicare PPO | Admitting: Podiatry

## 2023-10-28 DIAGNOSIS — B351 Tinea unguium: Secondary | ICD-10-CM | POA: Diagnosis not present

## 2023-10-28 DIAGNOSIS — M79609 Pain in unspecified limb: Secondary | ICD-10-CM | POA: Diagnosis not present

## 2023-10-28 DIAGNOSIS — Z9229 Personal history of other drug therapy: Secondary | ICD-10-CM | POA: Diagnosis not present

## 2023-10-28 NOTE — Progress Notes (Signed)
Subjective:  Patient ID: Alyssa Luna, female    DOB: 1934-03-23,  MRN: [id number]  Alyssa Luna presents to clinic today for painful elongated mycotic toenails 1-5 bilaterally which are tender when wearing enclosed shoe gear. Pain is relieved with periodic professional debridement.  New problem(s): None.    PCP is Default, Provider, MD , and last visit was October 21, 2021.  Allergies  Allergen Reactions   Alpha-Gal Itching and Nausea Only    Per patient, cannot have alpha-gal since tic bite episode. Per patient, cannot have alpha-gal since tic bite episode. Per patient, cannot have alpha-gal since tic bite episode.   Beef (Bovine) Protein Other (See Comments)    Alpha-gal allergy testing positive June 2018   Galactose Nausea Only    Per patient, cannot have alpha-gal since tic bite episode.   Pravastatin Anaphylaxis and Other (See Comments)    Legs ache   Milk (Cow) Nausea Only and Rash   Alpha-D-Galactosidase Itching   Amlodipine     Leg swelling   Amlodipine-Atorvastatin     swelling   Cefdinir     Nausea from suspension   Colchicine Other (See Comments)   Doxycycline Other (See Comments)    Nausea - able to take w/food   Losartan     Intolerance    Shellfish Allergy Other (See Comments)    Unknown   Tikosyn [Dofetilide] Other (See Comments)    Unknown reaction and was told never to take it due to problems during sleep    Actonel [Risedronate Sodium] Other (See Comments)    Not known   Alendronate Other (See Comments)    NOT KNOWN   Augmentin [Amoxicillin-Pot Clavulanate] Rash    Has patient had a PCN reaction causing immediate rash, facial/tongue/throat swelling, SOB or lightheadedness with hypotension: Yes Has patient had a PCN reaction causing severe rash involving mucus membranes or skin necrosis:No Has patient had a PCN reaction that required hospitalization: No Has patient had a PCN reaction occurring within the last 10 years: No If all of the above  answers are "NO", then may proceed with Cephalosporin use.    Beef Allergy Rash   Cheese Rash   Ciprofloxacin Nausea Only   Evista [Raloxifene] Other (See Comments)    Unknown reaction   Flagyl [Metronidazole] Other (See Comments)    Not known   Fosamax [Alendronate Sodium] Other (See Comments)    NOT KNOWN   Gold-Containing Drug Products Other (See Comments)    Per Dr   Judye Bos [Escitalopram Oxalate] Other (See Comments)    shaky   Other Rash   Pork Allergy Rash   Risedronate Other (See Comments)    Unknown reaction   Simvastatin Other (See Comments)    REACTION: leg cramps, weakness   Tramadol Other (See Comments)    Mental disturbance changes    Review of Systems: Negative except as noted in the HPI.  Objective: No changes noted in today's physical examination. Constitutional Alyssa Luna is a pleasant 89 y.o. Caucasian female, WD, WN in NAD. AAO x 3.   Vascular CFT immediate b/l LE. Palpable DP/PT pulses b/l LE. Digital hair sparse b/l. Skin temperature gradient WNL b/l. No pain with calf compression b/l. No edema noted b/l. Evidence of chronic venous insufficiency b/l LE. No cyanosis or clubbing noted b/l LE.  Neurologic Normal speech. Oriented to person, place, and time. Protective sensation intact 5/5 intact bilaterally with 10g monofilament b/l.  Dermatologic Pedal integument with normal turgor, texture and tone  b/l LE. No open wounds b/l. No interdigital macerations b/l. Toenails 1-5 b/l elongated, thickened, discolored with subungual debris. +Tenderness with dorsal palpation of nailplates.   Orthopedic: Normal muscle strength 5/5 to all lower extremity muscle groups bilaterally. Hammertoe(s) noted to the R 5th toe.[name] No pain, crepitus or joint limitation noted with ROM b/l LE.  Patient ambulates independently without assistive aids. No edema noted left hallux, no pain on palpation or ROM. No ecchymosis.   Radiographs: None  Assessment/Plan: 1. Pain due to  onychomycosis of nail   2. Hx of long term use of blood thinners      -Discussed and educated patient on foot care, especially with  regards to the vascular, neurological and musculoskeletal systems.  -Discussed supportive shoes at all times and checking feet regularly.  -Mechanically debrided all nails 1-5 bilateral using sterile nail nipper and filed with dremel without incident  -Answered all patient questions -Patient to return  in 3 months for at risk foot care -Patient advised to call the office if any problems or questions arise in the meantime.   No follow-ups on file.  Louann Sjogren, DPM

## 2024-01-28 ENCOUNTER — Encounter: Payer: Self-pay | Admitting: Podiatry

## 2024-01-28 ENCOUNTER — Ambulatory Visit: Admitting: Podiatry

## 2024-01-28 DIAGNOSIS — M79609 Pain in unspecified limb: Secondary | ICD-10-CM

## 2024-01-28 DIAGNOSIS — B351 Tinea unguium: Secondary | ICD-10-CM | POA: Diagnosis not present

## 2024-01-28 DIAGNOSIS — Z7901 Long term (current) use of anticoagulants: Secondary | ICD-10-CM

## 2024-01-28 NOTE — Progress Notes (Signed)
Subjective:  Patient ID: Alyssa Luna, female    DOB: 1934-03-23,  MRN: [id number]  Alyssa Luna presents to clinic today for painful elongated mycotic toenails 1-5 bilaterally which are tender when wearing enclosed shoe gear. Pain is relieved with periodic professional debridement.  New problem(s): None.    PCP is Default, Provider, MD , and last visit was October 21, 2021.  Allergies  Allergen Reactions   Alpha-Gal Itching and Nausea Only    Per patient, cannot have alpha-gal since tic bite episode. Per patient, cannot have alpha-gal since tic bite episode. Per patient, cannot have alpha-gal since tic bite episode.   Beef (Bovine) Protein Other (See Comments)    Alpha-gal allergy testing positive June 2018   Galactose Nausea Only    Per patient, cannot have alpha-gal since tic bite episode.   Pravastatin Anaphylaxis and Other (See Comments)    Legs ache   Milk (Cow) Nausea Only and Rash   Alpha-D-Galactosidase Itching   Amlodipine     Leg swelling   Amlodipine-Atorvastatin     swelling   Cefdinir     Nausea from suspension   Colchicine Other (See Comments)   Doxycycline Other (See Comments)    Nausea - able to take w/food   Losartan     Intolerance    Shellfish Allergy Other (See Comments)    Unknown   Tikosyn [Dofetilide] Other (See Comments)    Unknown reaction and was told never to take it due to problems during sleep    Actonel [Risedronate Sodium] Other (See Comments)    Not known   Alendronate Other (See Comments)    NOT KNOWN   Augmentin [Amoxicillin-Pot Clavulanate] Rash    Has patient had a PCN reaction causing immediate rash, facial/tongue/throat swelling, SOB or lightheadedness with hypotension: Yes Has patient had a PCN reaction causing severe rash involving mucus membranes or skin necrosis:No Has patient had a PCN reaction that required hospitalization: No Has patient had a PCN reaction occurring within the last 10 years: No If all of the above  answers are "NO", then may proceed with Cephalosporin use.    Beef Allergy Rash   Cheese Rash   Ciprofloxacin Nausea Only   Evista [Raloxifene] Other (See Comments)    Unknown reaction   Flagyl [Metronidazole] Other (See Comments)    Not known   Fosamax [Alendronate Sodium] Other (See Comments)    NOT KNOWN   Gold-Containing Drug Products Other (See Comments)    Per Dr   Judye Bos [Escitalopram Oxalate] Other (See Comments)    shaky   Other Rash   Pork Allergy Rash   Risedronate Other (See Comments)    Unknown reaction   Simvastatin Other (See Comments)    REACTION: leg cramps, weakness   Tramadol Other (See Comments)    Mental disturbance changes    Review of Systems: Negative except as noted in the HPI.  Objective: No changes noted in today's physical examination. Constitutional Alyssa Luna is a pleasant 89 y.o. Caucasian female, WD, WN in NAD. AAO x 3.   Vascular CFT immediate b/l LE. Palpable DP/PT pulses b/l LE. Digital hair sparse b/l. Skin temperature gradient WNL b/l. No pain with calf compression b/l. No edema noted b/l. Evidence of chronic venous insufficiency b/l LE. No cyanosis or clubbing noted b/l LE.  Neurologic Normal speech. Oriented to person, place, and time. Protective sensation intact 5/5 intact bilaterally with 10g monofilament b/l.  Dermatologic Pedal integument with normal turgor, texture and tone  b/l LE. No open wounds b/l. No interdigital macerations b/l. Toenails 1-5 b/l elongated, thickened, discolored with subungual debris. +Tenderness with dorsal palpation of nailplates.   Orthopedic: Normal muscle strength 5/5 to all lower extremity muscle groups bilaterally. Hammertoe(s) noted to the R 5th toe.[name] No pain, crepitus or joint limitation noted with ROM b/l LE.  Patient ambulates independently without assistive aids. No edema noted left hallux, no pain on palpation or ROM. No ecchymosis.   Radiographs: None  Assessment/Plan: 1. Pain due to  onychomycosis of nail   2. Hx of long term use of blood thinners      -Discussed and educated patient on foot care, especially with  regards to the vascular, neurological and musculoskeletal systems.  -Discussed supportive shoes at all times and checking feet regularly.  -Mechanically debrided all nails 1-5 bilateral using sterile nail nipper and filed with dremel without incident  -Answered all patient questions -Patient to return  in 3 months for at risk foot care -Patient advised to call the office if any problems or questions arise in the meantime.   No follow-ups on file.  Alyssa Luna, DPM

## 2024-04-28 ENCOUNTER — Encounter: Payer: Self-pay | Admitting: Podiatry

## 2024-04-28 ENCOUNTER — Ambulatory Visit: Admitting: Podiatry

## 2024-04-28 DIAGNOSIS — B351 Tinea unguium: Secondary | ICD-10-CM

## 2024-04-28 DIAGNOSIS — Z7901 Long term (current) use of anticoagulants: Secondary | ICD-10-CM | POA: Diagnosis not present

## 2024-04-28 DIAGNOSIS — M79609 Pain in unspecified limb: Secondary | ICD-10-CM

## 2024-04-28 NOTE — Progress Notes (Signed)
 Subjective:  Patient ID: Alyssa Luna, female    DOB: Nov 15, 1933,  MRN: [id number]  Alyssa Luna presents to clinic today for painful elongated mycotic toenails 1-5 bilaterally which are tender when wearing enclosed shoe gear. Pain is relieved with periodic professional debridement.  New problem(s): concern for left great toenail  part broken off.     PCP is Default, Provider, MD , and last visit was October 21, 2021.  Allergies  Allergen Reactions   Alpha-Gal Itching and Nausea Only    Per patient, cannot have alpha-gal since tic bite episode. Per patient, cannot have alpha-gal since tic bite episode. Per patient, cannot have alpha-gal since tic bite episode.   Beef (Bovine) Protein Other (See Comments)    Alpha-gal allergy testing positive June 2018   Galactose Nausea Only    Per patient, cannot have alpha-gal since tic bite episode.   Pravastatin  Anaphylaxis and Other (See Comments)    Legs ache   Milk (Cow) Nausea Only and Rash   Alpha-D-Galactosidase Itching   Amlodipine      Leg swelling   Amlodipine -Atorvastatin      swelling   Cefdinir      Nausea from suspension   Colchicine  Other (See Comments)   Doxycycline  Other (See Comments)    Nausea - able to take w/food   Losartan      Intolerance    Shellfish Allergy Other (See Comments)    Unknown   Tikosyn [Dofetilide] Other (See Comments)    Unknown reaction and was told never to take it due to problems during sleep    Actonel [Risedronate Sodium] Other (See Comments)    Not known   Alendronate Other (See Comments)    NOT KNOWN   Augmentin  [Amoxicillin -Pot Clavulanate] Rash    Has patient had a PCN reaction causing immediate rash, facial/tongue/throat swelling, SOB or lightheadedness with hypotension: Yes Has patient had a PCN reaction causing severe rash involving mucus membranes or skin necrosis:No Has patient had a PCN reaction that required hospitalization: No Has patient had a PCN reaction occurring within  the last 10 years: No If all of the above answers are NO, then may proceed with Cephalosporin use.    Beef Allergy Rash   Cheese Rash   Ciprofloxacin  Nausea Only   Evista [Raloxifene] Other (See Comments)    Unknown reaction   Flagyl  [Metronidazole ] Other (See Comments)    Not known   Fosamax [Alendronate Sodium] Other (See Comments)    NOT KNOWN   Gold-Containing Drug Products Other (See Comments)    Per Dr   Clora  [Escitalopram  Oxalate] Other (See Comments)    shaky   Other Rash   Pork Allergy Rash   Risedronate Other (See Comments)    Unknown reaction   Simvastatin Other (See Comments)    REACTION: leg cramps, weakness   Tramadol  Other (See Comments)    Mental disturbance changes    Review of Systems: Negative except as noted in the HPI.  Objective: No changes noted in today's physical examination. Constitutional Alyssa Luna is a pleasant 89 y.o. Caucasian female, WD, WN in NAD. AAO x 3.   Vascular CFT immediate b/l LE. Palpable DP/PT pulses b/l LE. Digital hair sparse b/l. Skin temperature gradient WNL b/l. No pain with calf compression b/l. No edema noted b/l. Evidence of chronic venous insufficiency b/l LE. No cyanosis or clubbing noted b/l LE.  Neurologic Normal speech. Oriented to person, place, and time. Protective sensation intact 5/5 intact bilaterally with 10g monofilament b/l.  Dermatologic Pedal integument with normal turgor, texture and tone b/l LE. No open wounds b/l. No interdigital macerations b/l. Toenails 1-5 b/l elongated, thickened, discolored with subungual debris. +Tenderness with dorsal palpation of nailplates.   Orthopedic: Normal muscle strength 5/5 to all lower extremity muscle groups bilaterally. Hammertoe(s) noted to the R 5th toe.Alyssa Luna No pain, crepitus or joint limitation noted with ROM b/l LE.  Patient ambulates independently without assistive aids. No edema noted left hallux, no pain on palpation or ROM. No ecchymosis.   Radiographs:  None  Assessment/Plan: 1. Pain due to onychomycosis of nail   2. Hx of long term use of blood thinners      -Discussed and educated patient on foot care, especially with  regards to the vascular, neurological and musculoskeletal systems.  -Discussed supportive shoes at all times and checking feet regularly.  -Mechanically debrided all nails 1-5 bilateral using sterile nail nipper and filed with dremel without incident  -Answered all patient questions -Patient to return  in 3 months for at risk foot care -Patient advised to call the office if any problems or questions arise in the meantime.   No follow-ups on file.  Asberry Failing, DPM

## 2024-05-18 ENCOUNTER — Other Ambulatory Visit (HOSPITAL_COMMUNITY): Payer: Self-pay | Admitting: Pharmacy Technician

## 2024-05-18 DIAGNOSIS — D509 Iron deficiency anemia, unspecified: Secondary | ICD-10-CM

## 2024-05-19 ENCOUNTER — Encounter (HOSPITAL_COMMUNITY): Payer: Self-pay | Admitting: Geriatric Medicine

## 2024-05-19 ENCOUNTER — Telehealth (HOSPITAL_COMMUNITY): Payer: Self-pay

## 2024-05-19 DIAGNOSIS — D509 Iron deficiency anemia, unspecified: Secondary | ICD-10-CM | POA: Insufficient documentation

## 2024-05-19 NOTE — Telephone Encounter (Signed)
 Auth Submission: NO AUTH NEEDED Site of care: Site of care: [facility] Payer: [employer] Medication & CPT/J Code(s) submitted: Venofer (Iron Sucrose) J1756 Diagnosis Code: D50.9 Route of submission (phone, fax, portal): Phone # Fax # Auth type: Buy/Bill HB Units/visits requested: 300mg  x 3 doses Reference number:  Approval from: 05/19/24 to 08/16/24

## 2024-05-31 ENCOUNTER — Encounter (HOSPITAL_COMMUNITY)
Admission: RE | Admit: 2024-05-31 | Discharge: 2024-05-31 | Disposition: A | Discharge status: Home / Self Care | Source: Ambulatory Visit | Attending: Geriatric Medicine | Admitting: Geriatric Medicine

## 2024-05-31 VITALS — BP 137/79 | HR 101 | Temp 97.8°F | Resp 17

## 2024-05-31 DIAGNOSIS — D509 Iron deficiency anemia, unspecified: Secondary | ICD-10-CM | POA: Insufficient documentation

## 2024-05-31 MED ORDER — IRON SUCROSE 300 MG IVPB - SIMPLE MED
300.0000 mg | Freq: Once | Status: AC
  Administered 2024-05-31: 300 mg via INTRAVENOUS
  Filled 2024-05-31: qty 300

## 2024-06-07 ENCOUNTER — Ambulatory Visit (HOSPITAL_COMMUNITY)
Admission: RE | Admit: 2024-06-07 | Discharge: 2024-06-07 | Disposition: A | Discharge status: Home / Self Care | Source: Ambulatory Visit | Attending: Geriatric Medicine | Admitting: Geriatric Medicine

## 2024-06-07 VITALS — BP 151/75 | HR 80 | Temp 97.5°F | Resp 16

## 2024-06-07 DIAGNOSIS — D509 Iron deficiency anemia, unspecified: Secondary | ICD-10-CM | POA: Insufficient documentation

## 2024-06-07 MED ORDER — IRON SUCROSE 300 MG IVPB - SIMPLE MED
300.0000 mg | Freq: Once | Status: AC
  Administered 2024-06-07: 300 mg via INTRAVENOUS
  Filled 2024-06-07: qty 300

## 2024-06-14 ENCOUNTER — Encounter (HOSPITAL_COMMUNITY)

## 2024-06-16 ENCOUNTER — Encounter (HOSPITAL_COMMUNITY)
Admission: RE | Admit: 2024-06-16 | Discharge: 2024-06-16 | Disposition: A | Discharge status: Home / Self Care | Source: Ambulatory Visit | Attending: Geriatric Medicine | Admitting: Geriatric Medicine

## 2024-06-16 VITALS — BP 128/71 | HR 82 | Temp 98.0°F | Resp 16

## 2024-06-16 DIAGNOSIS — D509 Iron deficiency anemia, unspecified: Secondary | ICD-10-CM

## 2024-06-16 MED ORDER — IRON SUCROSE 300 MG IVPB - SIMPLE MED
300.0000 mg | Freq: Once | Status: AC
  Administered 2024-06-16: 300 mg via INTRAVENOUS
  Filled 2024-06-16: qty 300

## 2024-07-28 ENCOUNTER — Ambulatory Visit: Admitting: Podiatry

## 2024-08-03 ENCOUNTER — Ambulatory Visit: Admitting: Podiatry

## 2024-08-03 DIAGNOSIS — Z91199 Patient's noncompliance with other medical treatment and regimen due to unspecified reason: Secondary | ICD-10-CM

## 2024-08-03 NOTE — Progress Notes (Signed)
 Cancel 24 hours
